# Patient Record
Sex: Female | Born: 1960
Health system: Southern US, Community
[De-identification: ages and names within clinical notes are randomized; demographics above are authoritative.]

## PROBLEM LIST (undated history)

## (undated) ENCOUNTER — Emergency Department: Discharge status: Absent without leave | Payer: Medicare Other

## (undated) DIAGNOSIS — Z972 Presence of dental prosthetic device (complete) (partial): Secondary | ICD-10-CM

## (undated) DIAGNOSIS — K589 Irritable bowel syndrome without diarrhea: Secondary | ICD-10-CM

## (undated) DIAGNOSIS — I639 Cerebral infarction, unspecified: Secondary | ICD-10-CM

## (undated) DIAGNOSIS — F329 Major depressive disorder, single episode, unspecified: Secondary | ICD-10-CM

## (undated) DIAGNOSIS — R0789 Other chest pain: Secondary | ICD-10-CM

## (undated) DIAGNOSIS — M1990 Unspecified osteoarthritis, unspecified site: Secondary | ICD-10-CM

## (undated) DIAGNOSIS — I5189 Other ill-defined heart diseases: Secondary | ICD-10-CM

## (undated) DIAGNOSIS — K219 Gastro-esophageal reflux disease without esophagitis: Secondary | ICD-10-CM

## (undated) DIAGNOSIS — I2510 Atherosclerotic heart disease of native coronary artery without angina pectoris: Secondary | ICD-10-CM

## (undated) DIAGNOSIS — G459 Transient cerebral ischemic attack, unspecified: Secondary | ICD-10-CM

## (undated) DIAGNOSIS — J449 Chronic obstructive pulmonary disease, unspecified: Secondary | ICD-10-CM

## (undated) DIAGNOSIS — M797 Fibromyalgia: Secondary | ICD-10-CM

## (undated) DIAGNOSIS — F32A Depression, unspecified: Secondary | ICD-10-CM

## (undated) DIAGNOSIS — R29898 Other symptoms and signs involving the musculoskeletal system: Secondary | ICD-10-CM

## (undated) DIAGNOSIS — R55 Syncope and collapse: Secondary | ICD-10-CM

## (undated) DIAGNOSIS — I959 Hypotension, unspecified: Secondary | ICD-10-CM

## (undated) HISTORY — DX: Depression, unspecified: F32.A

## (undated) HISTORY — PX: TONSILLECTOMY: SUR1361

## (undated) HISTORY — DX: Syncope and collapse: R55

## (undated) HISTORY — DX: Other chest pain: R07.89

## (undated) HISTORY — DX: Other ill-defined heart diseases: I51.89

## (undated) HISTORY — DX: Hypotension, unspecified: I95.9

## (undated) HISTORY — DX: Atherosclerotic heart disease of native coronary artery without angina pectoris: I25.10

## (undated) HISTORY — DX: Major depressive disorder, single episode, unspecified: F32.9

## (undated) HISTORY — PX: VAGINAL HYSTERECTOMY: SUR661

## (undated) HISTORY — DX: Other symptoms and signs involving the musculoskeletal system: R29.898

## (undated) HISTORY — PX: OOPHORECTOMY: SHX86

## (undated) HISTORY — DX: Transient cerebral ischemic attack, unspecified: G45.9

---

## 2003-08-13 LAB — HM COLONOSCOPY

## 2007-11-13 ENCOUNTER — Other Ambulatory Visit: Payer: Self-pay

## 2007-11-13 ENCOUNTER — Emergency Department: Payer: Self-pay | Admitting: Emergency Medicine

## 2010-07-26 ENCOUNTER — Observation Stay: Payer: Self-pay | Admitting: Internal Medicine

## 2011-02-08 ENCOUNTER — Ambulatory Visit: Payer: Self-pay | Admitting: Family Medicine

## 2011-04-22 ENCOUNTER — Emergency Department: Payer: Self-pay | Admitting: Emergency Medicine

## 2011-04-28 ENCOUNTER — Ambulatory Visit: Payer: Self-pay | Admitting: Nephrology

## 2011-05-05 ENCOUNTER — Ambulatory Visit: Payer: Self-pay | Admitting: Family Medicine

## 2011-05-10 ENCOUNTER — Ambulatory Visit: Payer: Self-pay

## 2011-05-22 ENCOUNTER — Ambulatory Visit: Payer: Self-pay | Admitting: Family Medicine

## 2011-06-20 ENCOUNTER — Ambulatory Visit: Payer: Self-pay

## 2012-01-08 ENCOUNTER — Ambulatory Visit: Payer: Self-pay | Admitting: Family Medicine

## 2012-01-16 ENCOUNTER — Ambulatory Visit: Payer: Self-pay | Admitting: Internal Medicine

## 2012-05-16 ENCOUNTER — Ambulatory Visit: Payer: Self-pay | Admitting: Family Medicine

## 2012-05-17 ENCOUNTER — Ambulatory Visit: Payer: Self-pay | Admitting: Family Medicine

## 2013-04-25 ENCOUNTER — Ambulatory Visit: Payer: Self-pay | Admitting: Neurology

## 2013-04-28 ENCOUNTER — Ambulatory Visit: Payer: Self-pay | Admitting: Neurology

## 2013-07-26 ENCOUNTER — Emergency Department: Payer: Self-pay | Admitting: Internal Medicine

## 2013-11-27 ENCOUNTER — Observation Stay: Payer: Self-pay | Admitting: Internal Medicine

## 2013-11-27 LAB — COMPREHENSIVE METABOLIC PANEL
ANION GAP: 1 — AB (ref 7–16)
Albumin: 3.9 g/dL (ref 3.4–5.0)
Alkaline Phosphatase: 73 U/L
BUN: 10 mg/dL (ref 7–18)
Bilirubin,Total: 0.4 mg/dL (ref 0.2–1.0)
CHLORIDE: 106 mmol/L (ref 98–107)
Calcium, Total: 8.8 mg/dL (ref 8.5–10.1)
Co2: 30 mmol/L (ref 21–32)
Creatinine: 0.78 mg/dL (ref 0.60–1.30)
EGFR (African American): >60
EGFR (Non-African Amer.): >60
GLUCOSE: 97 mg/dL (ref 65–99)
OSMOLALITY: 273 (ref 275–301)
POTASSIUM: 4.1 mmol/L (ref 3.5–5.1)
SGOT(AST): 29 U/L (ref 15–37)
SGPT (ALT): 23 U/L (ref 12–78)
Sodium: 137 mmol/L (ref 136–145)
Total Protein: 8 g/dL (ref 6.4–8.2)

## 2013-11-27 LAB — DRUG SCREEN, URINE

## 2013-11-27 LAB — URINALYSIS, COMPLETE
BLOOD: NEGATIVE
Bilirubin,UR: NEGATIVE
Glucose,UR: NEGATIVE mg/dL (ref 0–75)
Ketone: NEGATIVE
LEUKOCYTE ESTERASE: NEGATIVE
Nitrite: NEGATIVE
PH: 9 (ref 4.5–8.0)
Protein: NEGATIVE
RBC,UR: <1 /HPF (ref 0–5)
SPECIFIC GRAVITY: 1.006 (ref 1.003–1.030)
SQUAMOUS EPITHELIAL: NONE SEEN
WBC UR: <1 /HPF (ref 0–5)

## 2013-11-27 LAB — CBC WITH DIFFERENTIAL/PLATELET
BASOS PCT: 0.9 %
Basophil #: 0.1 10*3/uL (ref 0.0–0.1)
Eosinophil #: 0.1 10*3/uL (ref 0.0–0.7)
Eosinophil %: 1 %
HCT: 45.5 % (ref 35.0–47.0)
HGB: 15.3 g/dL (ref 12.0–16.0)
Lymphocyte #: 2.9 10*3/uL (ref 1.0–3.6)
Lymphocyte %: 38.9 %
MCH: 30.6 pg (ref 26.0–34.0)
MCHC: 33.5 g/dL (ref 32.0–36.0)
MCV: 91 fL (ref 80–100)
MONO ABS: 0.5 x10 3/mm (ref 0.2–0.9)
MONOS PCT: 6.9 %
NEUTROS PCT: 52.3 %
Neutrophil #: 3.9 10*3/uL (ref 1.4–6.5)
PLATELETS: 255 10*3/uL (ref 150–440)
RBC: 4.98 10*6/uL (ref 3.80–5.20)
RDW: 12.9 % (ref 11.5–14.5)
WBC: 7.4 10*3/uL (ref 3.6–11.0)

## 2013-11-27 LAB — APTT: Activated PTT: 26.2 secs (ref 23.6–35.9)

## 2013-11-27 LAB — PROTIME-INR
INR: 0.9
PROTHROMBIN TIME: 12.3 s (ref 11.5–14.7)

## 2013-11-27 LAB — TROPONIN I

## 2013-11-28 LAB — BASIC METABOLIC PANEL
ANION GAP: 1 — AB (ref 7–16)
BUN: 14 mg/dL (ref 7–18)
CALCIUM: 8.5 mg/dL (ref 8.5–10.1)
CO2: 32 mmol/L (ref 21–32)
Chloride: 107 mmol/L (ref 98–107)
Creatinine: 0.75 mg/dL (ref 0.60–1.30)
EGFR (Non-African Amer.): >60
Glucose: 89 mg/dL (ref 65–99)
Osmolality: 279 (ref 275–301)
Potassium: 4.1 mmol/L (ref 3.5–5.1)
Sodium: 140 mmol/L (ref 136–145)

## 2013-11-28 LAB — CBC WITH DIFFERENTIAL/PLATELET
BASOS ABS: 0 10*3/uL (ref 0.0–0.1)
Basophil %: 0.5 %
EOS PCT: 1.3 %
Eosinophil #: 0.1 10*3/uL (ref 0.0–0.7)
HCT: 41 % (ref 35.0–47.0)
HGB: 13.8 g/dL (ref 12.0–16.0)
LYMPHS ABS: 2.8 10*3/uL (ref 1.0–3.6)
Lymphocyte %: 48.7 %
MCH: 30.9 pg (ref 26.0–34.0)
MCHC: 33.8 g/dL (ref 32.0–36.0)
MCV: 92 fL (ref 80–100)
Monocyte #: 0.6 x10 3/mm (ref 0.2–0.9)
Monocyte %: 10.8 %
NEUTROS ABS: 2.2 10*3/uL (ref 1.4–6.5)
Neutrophil %: 38.7 %
Platelet: 236 10*3/uL (ref 150–440)
RBC: 4.48 10*6/uL (ref 3.80–5.20)
RDW: 12.7 % (ref 11.5–14.5)
WBC: 5.7 10*3/uL (ref 3.6–11.0)

## 2013-11-28 LAB — LIPID PANEL
CHOLESTEROL: 203 mg/dL — AB (ref 0–200)
HDL Cholesterol: 44 mg/dL (ref 40–60)
LDL CHOLESTEROL, CALC: 131 mg/dL — AB (ref 0–100)
TRIGLYCERIDES: 140 mg/dL (ref 0–200)
VLDL Cholesterol, Calc: 28 mg/dL (ref 5–40)

## 2013-11-28 LAB — TSH: THYROID STIMULATING HORM: 1.7 u[IU]/mL

## 2013-11-29 LAB — URINE CULTURE

## 2013-12-19 DIAGNOSIS — F419 Anxiety disorder, unspecified: Secondary | ICD-10-CM | POA: Insufficient documentation

## 2013-12-19 DIAGNOSIS — F339 Major depressive disorder, recurrent, unspecified: Secondary | ICD-10-CM | POA: Insufficient documentation

## 2013-12-19 DIAGNOSIS — F329 Major depressive disorder, single episode, unspecified: Secondary | ICD-10-CM

## 2014-03-26 ENCOUNTER — Ambulatory Visit: Payer: Self-pay | Admitting: Family Medicine

## 2014-03-26 LAB — HM MAMMOGRAPHY

## 2014-04-27 ENCOUNTER — Emergency Department: Payer: Self-pay | Admitting: Emergency Medicine

## 2014-04-27 LAB — URINALYSIS, COMPLETE
BILIRUBIN, UR: NEGATIVE
Blood: NEGATIVE
Glucose,UR: NEGATIVE mg/dL (ref 0–75)
Ketone: NEGATIVE
LEUKOCYTE ESTERASE: NEGATIVE
Nitrite: NEGATIVE
PH: 8 (ref 4.5–8.0)
Protein: NEGATIVE
RBC,UR: NONE SEEN /HPF (ref 0–5)
Specific Gravity: 1.005 (ref 1.003–1.030)
Squamous Epithelial: <1
WBC UR: NONE SEEN /HPF (ref 0–5)

## 2014-04-27 LAB — BASIC METABOLIC PANEL
Anion Gap: 11 (ref 7–16)
BUN: 7 mg/dL (ref 7–18)
CHLORIDE: 106 mmol/L (ref 98–107)
Calcium, Total: 8.9 mg/dL (ref 8.5–10.1)
Co2: 27 mmol/L (ref 21–32)
Creatinine: 0.81 mg/dL (ref 0.60–1.30)
EGFR (African American): >60
Glucose: 85 mg/dL (ref 65–99)
Osmolality: 284 (ref 275–301)
Potassium: 3.8 mmol/L (ref 3.5–5.1)
Sodium: 144 mmol/L (ref 136–145)

## 2014-04-27 LAB — CBC WITH DIFFERENTIAL/PLATELET
Basophil #: 0.1 10*3/uL (ref 0.0–0.1)
Basophil %: 1 %
EOS ABS: 0 10*3/uL (ref 0.0–0.7)
Eosinophil %: 0.6 %
HCT: 44.8 % (ref 35.0–47.0)
HGB: 14.9 g/dL (ref 12.0–16.0)
Lymphocyte #: 2.7 10*3/uL (ref 1.0–3.6)
Lymphocyte %: 48 %
MCH: 30.9 pg (ref 26.0–34.0)
MCHC: 33.3 g/dL (ref 32.0–36.0)
MCV: 93 fL (ref 80–100)
MONO ABS: 0.4 x10 3/mm (ref 0.2–0.9)
Monocyte %: 7.2 %
Neutrophil #: 2.4 10*3/uL (ref 1.4–6.5)
Neutrophil %: 43.2 %
PLATELETS: 237 10*3/uL (ref 150–440)
RBC: 4.82 10*6/uL (ref 3.80–5.20)
RDW: 12.9 % (ref 11.5–14.5)
WBC: 5.6 10*3/uL (ref 3.6–11.0)

## 2014-04-27 LAB — TROPONIN I

## 2014-12-12 NOTE — Discharge Summary (Signed)
PATIENT NAME:  Lindsey Bautista, Lindsey Bautista MR#:  [id number] DATE OF BIRTH:  07-11-1961  DATE OF ADMISSION:  11/27/2013 DATE OF DISCHARGE:  11/28/2013  DISCHARGE DIAGNOSES:  Suspected anxiety and unusual stress.  No cerebrovascular accident.   SECONDARY DIAGNOSES: 1.  Depression.  2.  Headache.  3.  B12 deficiency.   CONSULTATION:  Physical and occupational therapy along with speech therapy.   PROCEDURES AND RADIOLOGY:   1.  A 2-D echocardiogram on 10th of April showed LVEF of 55% to 60%.  Normal global LV systolic function.  Mild mitral and tricuspid valve regurgitation.  2.  Chest x-ray on 9th of April showed findings consistent with COPD.  3.  CT scan of the head without contrast in the ED showed no acute intracranial pathology.   4.  CT scan of the cervical spine without contrast on 9th of April showed no acute pathology.  5.  MRI of the brain with and without contrast on 9th of April showed no acute pathology.   6.  MRI of the cervical spine without contrast on 9th of April showed mild broad-based disk bulging at C5 to C6.  7.  Bilateral carotid Doppler on 9th of April showed no evidence of focal carotid stenosis.   MAJOR LABORATORY PANEL:   1.  UA on admission was negative.  2.  Urine culture was negative.   SHORT HOSPITAL COURSE:  The patient is a 54 year old female with the above-mentioned medical problems who was admitted for possible passing out spell.  Please see Dr. Glade Stanford dictated history and physical for further details.  The patient underwent complete neurological work-up including carotid Dopplers, MRI of the brain and CT scan of the head which were all essentially negative.  After a long discussion with family and the patient it became apparent that the patient is under extreme stress and a lot of anxiety which is likely  playing a role in her episode of passing out.  The patient was back to normal.  She did not have any deficit.  The patient was evaluated by physical and  occupational therapy and no skilled needs were identified.  On 10th of April the patient was back to her baseline and was discharged home in stable condition.   PHYSICAL EXAMINATION: VITAL SIGNS:  On the date of discharge, her vital signs are as follows:  Temperature 98.1, heart rate 72 per minute, respirations 16 per minute, blood pressure 110/68 Hg.  She was saturating 94% on room air.  Pertinent physical examination on the date of discharge:  CARDIOVASCULAR:  S1, S2 normal.  No murmurs, rubs, or gallop.  LUNGS:  Clear to auscultation bilaterally.  No wheezing, rales, rhonchi, or crepitation.  ABDOMEN:  Soft, benign.  NEUROLOGIC:  Nonfocal examination.  All other physical examination remained at baseline.   DISCHARGE MEDICATIONS: 1.  Klonopin 1 mg by mouth twice daily as needed.  2.  Fluoxetine 20 mg by mouth daily.  3.  Cyclobenzaprine 10 mg by mouth twice daily as needed.   DISCHARGE DIET:  Regular.   DISCHARGE ACTIVITY:  As tolerated.   DISCHARGE INSTRUCTIONS AND FOLLOW-UP:  The patient was instructed to follow up with her primary care physician, Dr. Golden Pop, in 1 to 2 weeks.  She will need follow-up with [facility] in 4 to 6 weeks and with Dr. Cephus Shelling from psychiatry in 2 or 3 weeks.   Total time discharging this patient was 45 minutes.     ____________________________ Lucina Mellow. Manuella Ghazi,  MD vss:ea D: 11/28/2013 23:44:05 ET T: 11/29/2013 06:37:29 ET JOB#: [id number]  cc: [place]vada Seaborn S. Manuella Ghazi, MD, <Dictator> [name], MD Steva Colder. Nicolasa Ducking, MD [facility] Lucina Mellow [facility] MD ELECTRONICALLY SIGNED 12/03/2013 12:43

## 2014-12-12 NOTE — H&P (Signed)
PATIENT NAME:  Lindsey Bautista, Lindsey Bautista MR#:  [id number] DATE OF BIRTH:  10-16-1960  DATE OF ADMISSION:  11/27/2013  PRIMARY CARE PHYSICIAN: Golden Pop, MD  CHIEF COMPLAINT: Passed out.   HISTORY OF PRESENT ILLNESS: This is a 54 year old female. Husband this morning went to work. She did not say much this morning with breakfast and was not talking that much. The patient actually called her daughter and stated please help me. When the daughter came over she found her on the floor near the bathroom. The patient cannot recall much of what went on. The patient also found to be talking less, was just looking at the EMS people. Here in the ER was able to talk some one word answers and able to understand, not able to elaborate very much and difficult to understand. Yesterday, as per the husband, very weak and laid on the bed most of the day and her blood pressure was low. A few weeks ago had a similar event where she got up to quick and ended up falling on the floor. She has been out of work for a period of time and actually saw a neurologist, Dr. Manuella Ghazi, in the past because her grip strength and memory has not been that good and had a MRI without contrast that was negative. It showed white matter changes. Her grip strength on the right side and her memory have worsened over time. Hospitalist services have been contacted for further evaluation. That MRI of the brain that was done in the past was done September 2014.   PAST MEDICAL HISTORY: Depression, headache, B12 deficiency, white matter changes on the brain and problems with grip and memory.   PAST SURGICAL HISTORY: Hysterectomy and tonsils.   ALLERGIES: IVP DYE AND PENICILLIN.   MEDICATIONS: Include cyclobenzaprine 10 mg twice a day as needed for muscle spasm, fluoxetine 20 mg daily, Klonopin 1 mg twice a day as needed for anxiety.   SOCIAL HISTORY: She smokes 1 pack per day. No alcohol. No drug use. Used to work as a Magazine features editor. Currently unable to work very  much.   FAMILY HISTORY: Father died of COPD. Mother was mentally ill.   REVIEW OF SYSTEMS: Difficult to obtain secondary to dysarthria and difficult to understand.   PHYSICAL EXAMINATION: VITAL SIGNS: Temperature 97, pulse 68, respirations 20, blood pressure 122/62, pulse ox 100% on room air.  GENERAL: No respiratory distress.  EYES: Conjunctivae and lids normal. Pupils equal, round, and reactive to light. Extraocular muscles intact. No nystagmus.  EARS, NOSE, MOUTH AND THROAT: Tympanic membranes: No erythema. Nasal mucosa: No erythema. Throat: No erythema. no exudate seen. Lips and gums: No lesions.  NECK: The patient was in a hard collar when I saw her.  HEART: S1, S2 normal. No gallops, rubs, or murmurs heard. Carotid upstroke 2+ bilaterally. No bruits. Dorsalis pedis pulses 2+ bilaterally. No edema of the lower extremity.  ABDOMEN: Soft, nontender. No organosplenomegaly. Normoactive bowel sounds. No masses felt.  LYMPHATIC: No lymph nodes in the neck.  MUSCULOSKELETAL: No clubbing, edema or cyanosis.  SKIN: No ulcers or lesions.  NEUROLOGIC: Right-sided power 3+ on grip strength, 3+ on right upper extremity, 4/5 on right lower extremity, 4/5 on left lower extremity, 4/5 left upper extremity. Babinski negative. Cranial nerves II through XII grossly intact. Deep tendon reflexes 1+ bilateral lower extremity.  PSYCHIATRIC: The patient is alert and oriented to person and place. Follows commands.   DIAGNOSTIC DATA: Troponin negative. Glucose 97, BUN 10, creatinine 0.78, sodium 137,  potassium 4.1, chloride 106, CO2 30, calcium 8.8. Liver function tests normal range. PT, INR normal range. White blood cell count 7.4, H and H 15.3 and 45.5, platelet count 255,000.   CT scan of the cervical spine showed no gross intracranial abnormality, normal cervical spine.   CT scan of the head showed no gross intracranial abnormality.   Chest x-ray consistent with COPD.  EKG: Normal sinus rhythm, 81 beats  per minute. No acute ST-T wave changes.   ASSESSMENT AND PLAN: 1.  Suspected cerebrovascular accident with speech abnormalities, dysarthria and right-sided weakness more than left side. Will rule out a brain tumor and suspected stroke. Aspirin was given rectally in the Emergency Room. We will have the nurse do a swallow evaluation and get a speech evaluation. Continue aspirin daily. We will obtain a MRI of the brain with and without contrast to rule out brain tumor or stroke. Will also get a MRI of the cervical spine since the patient also has weakness on both sides. Get an echocardiogram and carotid ultrasound. Consults with speech therapy, PT, OT, and neurology. Case discussed with Dr. Irish Elders who will see the patient after imaging.   2.  Anxiety and depression. Continue psychiatric medications.  3.  B12 deficiency. Check a B12 level and continue oral supplementation. Since the patient also has memory loss, we will check a TSH and a RPR to complete that work-up.  4.  Tobacco abuse. Nicotine patch applied. Smoking cessation counseling done, 3 minutes by me.   ____________________________ Tana Conch. Leslye Peer, MD rjw:sb D: 11/27/2013 14:53:25 ET T: 11/27/2013 16:02:26 ET JOB#: [id number]  cc: Tana Conch. Leslye Peer, MD, <Dictator> [name], MD Marisue Brooklyn MD ELECTRONICALLY SIGNED 11/29/2013 15:54

## 2014-12-17 ENCOUNTER — Encounter: Payer: Self-pay | Admitting: Neurology

## 2014-12-17 ENCOUNTER — Ambulatory Visit (INDEPENDENT_AMBULATORY_CARE_PROVIDER_SITE_OTHER): Payer: Medicaid Other | Admitting: Neurology

## 2014-12-17 VITALS — BP 133/71 | HR 52 | Temp 97.0°F | Ht 62.0 in | Wt 129.8 lb

## 2014-12-17 DIAGNOSIS — R200 Anesthesia of skin: Secondary | ICD-10-CM | POA: Diagnosis not present

## 2014-12-17 DIAGNOSIS — R29898 Other symptoms and signs involving the musculoskeletal system: Secondary | ICD-10-CM | POA: Diagnosis not present

## 2014-12-17 DIAGNOSIS — R4182 Altered mental status, unspecified: Secondary | ICD-10-CM | POA: Diagnosis not present

## 2014-12-17 DIAGNOSIS — G8190 Hemiplegia, unspecified affecting unspecified side: Secondary | ICD-10-CM

## 2014-12-17 DIAGNOSIS — R202 Paresthesia of skin: Secondary | ICD-10-CM | POA: Diagnosis not present

## 2014-12-17 NOTE — Patient Instructions (Signed)
Overall you are doing fairly well but I do want to suggest a few things today:   Remember to drink plenty of fluid, eat healthy meals and do not skip any meals. Try to eat protein with a every meal and eat a healthy snack such as fruit or nuts in between meals. Try to keep a regular sleep-wake schedule and try to exercise daily, particularly in the form of walking, 20-30 minutes a day, if you can.   As far as diagnostic testing: MRI of the brain, eeg, lab  I would like to see you back after I receive all the records, will call you, sooner if we need to. Please call us with any interim questions, concerns, problems, updates or refill requests.   Please also call us for any test results so we can go over those with you on the phone.  My clinical assistant and will answer any of your questions and relay your messages to me and also relay most of my messages to you.   Our phone number is [phone]. We also have an after hours call service for urgent matters and there is a physician on-call for urgent questions. For any emergencies you know to call 911 or go to the nearest emergency room
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
## 2014-12-18 LAB — COMPREHENSIVE METABOLIC PANEL
A/G RATIO: 2 (ref 1.1–2.5)
ALT: 11 IU/L (ref 0–32)
AST: 17 IU/L (ref 0–40)
Albumin: 4.5 g/dL (ref 3.5–5.5)
Alkaline Phosphatase: 61 IU/L (ref 39–117)
BUN/Creatinine Ratio: 12 (ref 9–23)
BUN: 9 mg/dL (ref 6–24)
Bilirubin Total: 0.5 mg/dL (ref 0.0–1.2)
CALCIUM: 9.6 mg/dL (ref 8.7–10.2)
CO2: 26 mmol/L (ref 18–29)
CREATININE: 0.73 mg/dL (ref 0.57–1.00)
Chloride: 99 mmol/L (ref 97–108)
GFR calc non Af Amer: 94 mL/min/{1.73_m2} (ref >59)
GFR, EST AFRICAN AMERICAN: 109 mL/min/{1.73_m2} (ref >59)
GLOBULIN, TOTAL: 2.3 g/dL (ref 1.5–4.5)
Glucose: 87 mg/dL (ref 65–99)
Potassium: 5.3 mmol/L — ABNORMAL HIGH (ref 3.5–5.2)
SODIUM: 139 mmol/L (ref 134–144)
TOTAL PROTEIN: 6.8 g/dL (ref 6.0–8.5)

## 2014-12-24 ENCOUNTER — Ambulatory Visit
Admission: RE | Admit: 2014-12-24 | Discharge: 2014-12-24 | Disposition: A | Discharge status: Home / Self Care | Payer: Medicaid Other | Source: Ambulatory Visit | Attending: Neurology | Admitting: Neurology

## 2014-12-24 ENCOUNTER — Encounter (INDEPENDENT_AMBULATORY_CARE_PROVIDER_SITE_OTHER): Payer: Medicaid Other | Admitting: Neurology

## 2014-12-24 ENCOUNTER — Ambulatory Visit (INDEPENDENT_AMBULATORY_CARE_PROVIDER_SITE_OTHER): Payer: Medicaid Other | Admitting: Neurology

## 2014-12-24 DIAGNOSIS — R4182 Altered mental status, unspecified: Secondary | ICD-10-CM

## 2014-12-24 DIAGNOSIS — G8190 Hemiplegia, unspecified affecting unspecified side: Secondary | ICD-10-CM | POA: Diagnosis not present

## 2014-12-24 DIAGNOSIS — R200 Anesthesia of skin: Secondary | ICD-10-CM

## 2014-12-24 MED ORDER — GADOBENATE DIMEGLUMINE 529 MG/ML IV SOLN
12.0000 mL | Freq: Once | INTRAVENOUS | Status: AC | PRN
  Administered 2014-12-24: 12 mL via INTRAVENOUS

## 2014-12-24 NOTE — Procedures (Signed)
    History:  Lindsey Bautista is a 54 year old patient with a history of episodes of passing out associated with confusion, lack of memory for events. The patient is being evaluated for possible seizures.  This is a routine EEG. No skull defects are noted. Medications include clonazepam, Vivelle dot, Prozac, and Prilosec.   EEG classification: Normal awake  Description of the recording: The background rhythms of this recording consists of a fairly well modulated medium amplitude alpha rhythm of 9 Hz that is reactive to eye opening and closure. As the record progresses, the patient appears to remain in the waking state throughout the recording. Photic stimulation was performed, resulting in a bilateral and symmetric photic driving response. Hyperventilation was not performed. At no time during the recording does there appear to be evidence of spike or spike wave discharges or evidence of focal slowing. EKG monitor shows no evidence of cardiac rhythm abnormalities with a heart rate of 66.  Impression: This is a normal EEG recording in the waking state. No evidence of ictal or interictal discharges are seen.

## 2014-12-25 ENCOUNTER — Telehealth: Payer: Self-pay

## 2014-12-25 NOTE — Telephone Encounter (Signed)
Pt was informed that EGG results were normal

## 2014-12-28 ENCOUNTER — Telehealth: Payer: Self-pay | Admitting: Neurology

## 2014-12-28 NOTE — Telephone Encounter (Signed)
Called patient back to let her know results were not ready yet and I would call her back once MRI results are ready. Pt verbalized understanding.

## 2014-12-28 NOTE — Telephone Encounter (Signed)
Patient called wanting to f/u on her MRI results. Please call and advise # [phone]

## 2014-12-29 NOTE — Telephone Encounter (Signed)
Spoke with patient about normal MRI results for her age. Pt was wondering if it showed her previous stroke and what the doctor wanted to do from here. I told her I would send a message to Dr. Jaynee Eagles to clarify.

## 2014-12-29 NOTE — Telephone Encounter (Signed)
Spoke to patient, MRI did not show a stroke. Spoke to patient at length. No evidence for stroke.

## 2014-12-29 NOTE — Telephone Encounter (Signed)
Patient is calling as she has some question about her MRI results.  Please call.

## 2014-12-29 NOTE — Telephone Encounter (Signed)
Talked with patient to let her know Dr. Jaynee Eagles will call her back to discuss MRI results and her other questions. Pt verbalized understanding.

## 2014-12-31 ENCOUNTER — Ambulatory Visit
Admission: RE | Admit: 2014-12-31 | Discharge: 2014-12-31 | Disposition: A | Discharge status: Home / Self Care | Payer: Medicaid Other | Source: Ambulatory Visit | Attending: Family Medicine | Admitting: Family Medicine

## 2014-12-31 ENCOUNTER — Other Ambulatory Visit: Payer: Self-pay | Admitting: Family Medicine

## 2014-12-31 DIAGNOSIS — M25511 Pain in right shoulder: Secondary | ICD-10-CM

## 2015-01-05 ENCOUNTER — Other Ambulatory Visit: Payer: Self-pay | Admitting: Family Medicine

## 2015-01-05 DIAGNOSIS — M25611 Stiffness of right shoulder, not elsewhere classified: Secondary | ICD-10-CM

## 2015-01-12 ENCOUNTER — Ambulatory Visit: Admission: RE | Admit: 2015-01-12 | Payer: Medicaid Other | Source: Ambulatory Visit

## 2015-01-29 ENCOUNTER — Telehealth: Payer: Self-pay

## 2015-01-29 ENCOUNTER — Other Ambulatory Visit: Payer: Self-pay | Admitting: Family Medicine

## 2015-01-29 NOTE — Telephone Encounter (Signed)
Refill request fluoxetine 20mg 

## 2015-02-01 NOTE — Telephone Encounter (Signed)
Your patient 

## 2015-02-01 NOTE — Telephone Encounter (Signed)
Refill request also for Klonopin 1mg  PO BID PRN [facility] [address]  In addition has not heard anything about her cardiology referral

## 2015-02-02 ENCOUNTER — Ambulatory Visit
Admission: RE | Admit: 2015-02-02 | Discharge: 2015-02-02 | Disposition: A | Discharge status: Home / Self Care | Payer: Medicaid Other | Source: Ambulatory Visit | Attending: Family Medicine | Admitting: Family Medicine

## 2015-02-02 DIAGNOSIS — M25511 Pain in right shoulder: Secondary | ICD-10-CM | POA: Insufficient documentation

## 2015-02-02 DIAGNOSIS — R55 Syncope and collapse: Secondary | ICD-10-CM | POA: Insufficient documentation

## 2015-02-02 DIAGNOSIS — M25611 Stiffness of right shoulder, not elsewhere classified: Secondary | ICD-10-CM

## 2015-02-02 MED ORDER — FLUOXETINE HCL 10 MG PO CAPS: 20.0000 mg | ORAL_CAPSULE | Freq: Every day | ORAL | Status: DC

## 2015-02-25 ENCOUNTER — Other Ambulatory Visit: Payer: Self-pay | Admitting: Family Medicine

## 2015-02-25 NOTE — Telephone Encounter (Signed)
Not due until 03/04/15. OK to wait for MAC. Forwarded to him.

## 2015-02-25 NOTE — Telephone Encounter (Signed)
Rx Refill Request Received from:[facility] [address] Medication: Klonopin Last Seen:12/21/14 acute 12/08/14- Depression/anxiety Next Due: June 2016 Last Prescription:June 14 60 no refills Next Appt: Not scheduled Order Placed please review, sign and send

## 2015-02-26 NOTE — Telephone Encounter (Signed)
Received a voicemail from patient that she needs a refill on her klonopin. I spoke with patient, informed her that she should not be out until July 14, she stated that she takes them every 6hrs as needed and has had some family problems. I let her know that Dr.Crissman would be back on Monday, but Dr.Johnson would not be refilling it because it is to soon per Dr.Crissmans's directions.

## 2015-03-03 ENCOUNTER — Other Ambulatory Visit: Payer: Self-pay | Admitting: Family Medicine

## 2015-03-03 ENCOUNTER — Telehealth: Payer: Self-pay

## 2015-03-03 MED ORDER — CLONAZEPAM 1 MG PO TABS: 1.0000 mg | ORAL_TABLET | Freq: Two times a day (BID) | ORAL | Status: DC | PRN

## 2015-03-03 NOTE — Telephone Encounter (Signed)
Klonzapam 1mg  - [place]

## 2015-04-06 ENCOUNTER — Other Ambulatory Visit: Payer: Self-pay | Admitting: Family Medicine

## 2015-04-21 ENCOUNTER — Encounter: Payer: Self-pay | Admitting: Cardiovascular Disease

## 2015-04-21 ENCOUNTER — Ambulatory Visit (INDEPENDENT_AMBULATORY_CARE_PROVIDER_SITE_OTHER): Payer: Medicaid Other | Admitting: Cardiovascular Disease

## 2015-04-21 ENCOUNTER — Encounter (INDEPENDENT_AMBULATORY_CARE_PROVIDER_SITE_OTHER): Payer: Self-pay

## 2015-04-21 VITALS — BP 118/74 | HR 58 | Ht 62.0 in | Wt 131.8 lb

## 2015-04-21 DIAGNOSIS — Z720 Tobacco use: Secondary | ICD-10-CM | POA: Diagnosis not present

## 2015-04-21 DIAGNOSIS — R0602 Shortness of breath: Secondary | ICD-10-CM | POA: Diagnosis not present

## 2015-04-21 DIAGNOSIS — R55 Syncope and collapse: Secondary | ICD-10-CM | POA: Diagnosis not present

## 2015-04-21 DIAGNOSIS — F17210 Nicotine dependence, cigarettes, uncomplicated: Secondary | ICD-10-CM | POA: Insufficient documentation

## 2015-04-21 DIAGNOSIS — R079 Chest pain, unspecified: Secondary | ICD-10-CM

## 2015-04-21 DIAGNOSIS — F17200 Nicotine dependence, unspecified, uncomplicated: Secondary | ICD-10-CM

## 2015-04-21 NOTE — Assessment & Plan Note (Signed)
Symptoms concerning for stroke in April 2015, unable to talk for 4 days, right-sided weakness. No further episodes since that time of that significance.  She denies having Holter or event monitor in the past. Event monitor has been ordered to rule out arrhythmia

## 2015-04-21 NOTE — Patient Instructions (Addendum)
You are doing well. No medication changes were made.  We will order a 30 day monitor for  passout spells, syncope  Stress test can be ordered for shortness of breath, unable to treadmill  [place]  Your caregiver has ordered a Stress Test with nuclear imaging. The purpose of this test is to evaluate the blood supply to your heart muscle. This procedure is referred to as a "Non-Invasive Stress Test." This is because other than having an IV started in your vein, nothing is inserted or "invades" your body. Cardiac stress tests are done to find areas of poor blood flow to the heart by determining the extent of coronary artery disease (CAD). Some patients exercise on a treadmill, which naturally increases the blood flow to your heart, while others who are  unable to walk on a treadmill due to physical limitations have a pharmacologic/chemical stress agent called Lexiscan . This medicine will mimic walking on a treadmill by temporarily increasing your coronary blood flow.   Please note: these test may take anywhere between 2-4 hours to complete  PLEASE REPORT TO [facility] AT THE FIRST DESK WILL DIRECT YOU WHERE TO GO  Date of Procedure:_____Tuesday, Sept 6_____  Arrival Time for Procedure:____7:45 am_______   PLEASE NOTIFY THE OFFICE AT LEAST 24 HOURS IN ADVANCE IF YOU ARE UNABLE TO KEEP YOUR APPOINTMENT.  [phone]  How to prepare for your Myoview test:  1. Do not eat or drink after midnight 2. No caffeine for 24 hours prior to test 3. No smoking 24 hours prior to test. 4. Your medication may be taken with water.  If your doctor stopped a medication because of this test, do not take that medication. 5. Ladies, please do not wear dresses.  Skirts or pants are appropriate. Please wear a short sleeve shirt. 6. No perfume, cologne or lotion.  Please call us if you have new issues that need to be addressed before your next appt.    Cardiac Event  Monitoring A cardiac event monitor is a small recording device used to help detect abnormal heart rhythms (arrhythmias). The monitor is used to record heart rhythm when noticeable symptoms such as the following occur:  Fast heartbeats (palpitations), such as heart racing or fluttering.  Dizziness.  Fainting or light-headedness.  Unexplained weakness. The monitor is wired to two electrodes placed on your chest. Electrodes are flat, sticky disks that attach to your skin. The monitor can be worn for up to 30 days. You will wear the monitor at all times, except when bathing.  HOW TO USE YOUR CARDIAC EVENT MONITOR A technician will prepare your chest for the electrode placement. The technician will show you how to place the electrodes, how to work the monitor, and how to replace the batteries. Take time to practice using the monitor before you leave the office. Make sure you understand how to send the information from the monitor to your health care provider. This requires a telephone with a landline, not a cell phone. You need to:  Wear your monitor at all times, except when you are in water:  Do not get the monitor wet.  Take the monitor off when bathing. Do not swim or use a hot tub with it on.  Keep your skin clean. Do not put body lotion or moisturizer on your chest.  Change the electrodes daily or any time they stop sticking to your skin. You might need to use tape to keep them on.  It is possible that your skin under the electrodes could become irritated. To keep this from happening, try to put the electrodes in slightly different places on your chest. However, they must remain in the area under your left breast and in the upper right section of your chest.  Make sure the monitor is safely clipped to your clothing or in a location close to your body that your health care provider recommends.  Press the button to record when you feel symptoms of heart trouble, such as dizziness,  weakness, light-headedness, palpitations, thumping, shortness of breath, unexplained weakness, or a fluttering or racing heart. The monitor is always on and records what happened slightly before you pressed the button, so do not worry about being too late to get good information.  Keep a diary of your activities, such as walking, doing chores, and taking medicine. It is especially important to note what you were doing when you pushed the button to record your symptoms. This will help your health care provider determine what might be contributing to your symptoms. The information stored in your monitor will be reviewed by your health care provider alongside your diary entries.  Send the recorded information as recommended by your health care provider. It is important to understand that it will take some time for your health care provider to process the results.  Change the batteries as recommended by your health care provider. SEEK IMMEDIATE MEDICAL CARE IF:  7. You have chest pain. 8. You have extreme difficulty breathing or shortness of breath. 9. You develop a very fast heartbeat that persists. 10. You develop dizziness that does not go away. 11. You faint or constantly feel you are about to faint. Document Released: 05/16/2008 Document Revised: 12/22/2013 Document Reviewed: 02/03/2013 [facility] Patient Information 2015 [place], [place]. This information is not intended to replace advice given to you by your health care provider. Make sure you discuss any questions you have with your health care provider. Cardiac Nuclear Scanning A cardiac nuclear scan is used to check your heart for problems, such as the following:  A portion of the heart is not getting enough blood.  Part of the heart muscle has died, which happens with a heart attack.  The heart wall is not working normally.  In this test, a radioactive dye (tracer) is injected into your bloodstream. After the tracer has traveled to your  heart, a scanning device is used to measure how much of the tracer is absorbed by or distributed to various areas of your heart. LET [facility] CARE PROVIDER KNOW ABOUT:  Any allergies you have.  All medicines you are taking, including vitamins, herbs, eye drops, creams, and over-the-counter medicines.  Previous problems you or members of your family have had with the use of anesthetics.  Any blood disorders you have.  Previous surgeries you have had.  Medical conditions you have.  RISKS AND COMPLICATIONS Generally, this is a safe procedure. However, as with any procedure, problems can occur. Possible problems include:  12. Serious chest pain. 13. Rapid heartbeat. 14. Sensation of warmth in your chest. This usually passes quickly. BEFORE THE PROCEDURE Ask your health care provider about changing or stopping your regular medicines. PROCEDURE This procedure is usually done at a hospital and takes 2-4 hours.  An IV tube is inserted into one of your veins.  Your health care provider will inject a small amount of radioactive tracer through the tube.  You will then wait for 20-40 minutes while the tracer  travels through your bloodstream.  You will lie down on an exam table so images of your heart can be taken. Images will be taken for about 15-20 minutes.  You will exercise on a treadmill or stationary bike. While you exercise, your heart activity will be monitored with an electrocardiogram (ECG), and your blood pressure will be checked.  If you are unable to exercise, you may be given a medicine to make your heart beat faster.  When blood flow to your heart has peaked, tracer will again be injected through the IV tube.  After 20-40 minutes, you will get back on the exam table and have more images taken of your heart.  When the procedure is over, your IV tube will be removed. AFTER THE PROCEDURE  You will likely be able to leave shortly after the test. Unless your health care  provider tells you otherwise, you may return to your normal schedule, including diet, activities, and medicines.  Make sure you find out how and when you will get your test results. Document Released: 09/01/2004 Document Revised: 08/12/2013 Document Reviewed: 07/16/2013 [employer] Patient Information 2015 [place], [place]. This information is not intended to replace advice given to you by your health care provider. Make sure you discuss any questions you have with your health care provider.

## 2015-04-21 NOTE — Assessment & Plan Note (Signed)
Continued occasional chest pain symptoms. Sometimes at rest, sometimes with exertion.  Stress test has been ordered. She reports that she is unable to treadmill given orthopedic issues, shortness of breath issues  Myoview ordered

## 2015-04-21 NOTE — Assessment & Plan Note (Signed)
We have encouraged her to continue to work on weaning her cigarettes and smoking cessation. She will continue to work on this and does not want any assistance with chantix.  

## 2015-04-21 NOTE — Assessment & Plan Note (Signed)
Occasional shortness of breath issues. Unclear if this is from long smoking history and COPD, exacerbated by heat.  Less often associated with exertion though will order stress test without ischemia

## 2015-04-21 NOTE — Progress Notes (Signed)
Patient ID: Lindsey Bautista, female    DOB: 05/19/1961, 54 y.o.   MRN: [id number]  HPI Comments: [name] is a 54 year old woman with history of anxiety/depression, long smoking history for close to 40 years, strong family history of coronary artery disease, history of stroke type symptoms in April 2015 where she could not talk for 4 days, right-sided weakness, was in the hospital was significant workup at that time, seen by neurology since then, continued waxing waning symptoms of weakness, sometimes with shortness of breath, excessive somnolence, who presents for prior episode of syncope.  Previous episode of chest pain, had a stress test in 2015 which was reportedly normal. Notes also indicating stress test in 2013 which was normal by report.  She's not reactive at baseline though able to do all her ADLs. She has numerous issues including random weakness, episodes of shortness of breath, excessive somnolence rare once every 2 weeks she will have to lay down and she can sleep all day. Occasional dizziness. No recent syncope or near syncope. She's not been driving a car in 16 months. She continues to smoke heavily  Orthostatics done in the office today show no significant drop in her blood pressure, infect blood pressure climbed with standing. Heart rate stayed in the mid to upper 50s  Currently not on any cardiac medications .  EKG on today's visit shows sinus bradycardia with rate 58 bpm, no significant ST or T-wave changes    Allergies  Allergen Reactions  . Penicillin G Anaphylaxis  . Iodinated Diagnostic Agents Rash and Nausea And Vomiting    Current Outpatient Prescriptions on File Prior to Visit  Medication Sig Dispense Refill  . clonazePAM (KLONOPIN) 1 MG tablet Take 1 tablet (1 mg total) by mouth 2 (two) times daily as needed. 60 tablet 2  . estradiol (ESTRACE) 1 MG tablet TAKE ONE TABLET BY MOUTH ONCE DAILY 30 tablet 3  . estradiol (VIVELLE-DOT) 0.1 MG/24HR patch Place  onto the skin.    [name] FLUoxetine (PROZAC) 10 MG capsule Take 2 capsules (20 mg total) by mouth daily. 60 capsule 6  . omeprazole (PRILOSEC) 20 MG capsule Take 20 mg by mouth daily.     No current facility-administered medications on file prior to visit.    Past Medical History  Diagnosis Date  . Depression   . Anxiety   . Hypotension   . Right arm weakness     Past Surgical History  Procedure Laterality Date  . Vaginal hysterectomy      Social History  reports that she has been smoking Cigarettes.  She has been smoking about 1.00 pack per day. She does not have any smokeless tobacco history on file. She reports that she does not drink alcohol or use illicit drugs.  Family History family history includes COPD in an other family member; Heart disease in an other family member; Stroke in an other family member.   Review of Systems  Constitutional: Negative.   Respiratory: Positive for shortness of breath.   Cardiovascular: Positive for chest pain.  Gastrointestinal: Negative.   Musculoskeletal: Negative.   Skin: Negative.   Neurological: Positive for dizziness and light-headedness.  Hematological: Negative.   Psychiatric/Behavioral: Negative.   All other systems reviewed and are negative.   BP 118/74 mmHg  Pulse 58  Ht 5\' 2"  (1.575 m)  Wt 131 lb 12 oz (59.761 kg)  BMI 24.09 kg/m2  Physical Exam  Constitutional: She is oriented to person, place, and time. She appears well-developed  and well-nourished.  HENT:  Head: Normocephalic.  Nose: Nose normal.  Mouth/Throat: Oropharynx is clear and moist.  Eyes: Conjunctivae are normal. Pupils are equal, round, and reactive to light.  Neck: Normal range of motion. Neck supple. No JVD present.  Cardiovascular: Normal rate, regular rhythm, normal heart sounds and intact distal pulses.  Exam reveals no gallop and no friction rub.   No murmur heard. Pulmonary/Chest: Effort normal and breath sounds normal. No respiratory distress.  She has no wheezes. She has no rales. She exhibits no tenderness.  Abdominal: Soft. Bowel sounds are normal. She exhibits no distension. There is no tenderness.  Musculoskeletal: Normal range of motion. She exhibits no edema or tenderness.  Lymphadenopathy:    She has no cervical adenopathy.  Neurological: She is alert and oriented to person, place, and time. Coordination normal.  Skin: Skin is warm and dry. No rash noted. No erythema.  Psychiatric: She has a normal mood and affect. Her behavior is normal. Judgment and thought content normal.

## 2015-04-21 NOTE — Assessment & Plan Note (Signed)
Orthostatics were normal. Currently not on any medications  Given prior history of syncope, 30 day monitor has been ordered to rule out arrhythmia

## 2015-04-27 ENCOUNTER — Encounter
Admission: RE | Admit: 2015-04-27 | Discharge: 2015-04-27 | Disposition: A | Discharge status: Home / Self Care | Payer: Medicaid Other | Source: Ambulatory Visit | Attending: Cardiovascular Disease | Admitting: Cardiovascular Disease

## 2015-04-27 DIAGNOSIS — R55 Syncope and collapse: Secondary | ICD-10-CM | POA: Diagnosis not present

## 2015-04-27 DIAGNOSIS — R0602 Shortness of breath: Secondary | ICD-10-CM | POA: Diagnosis not present

## 2015-04-27 HISTORY — DX: Chronic obstructive pulmonary disease, unspecified: J44.9

## 2015-04-27 HISTORY — DX: Cerebral infarction, unspecified: I63.9

## 2015-04-27 LAB — NM MYOCAR MULTI W/SPECT W/WALL MOTION / EF
CHL CUP NUCLEAR SDS: 0
CHL CUP NUCLEAR SRS: 2
CSEPHR: 59 %
LV dias vol: 55 mL
LV sys vol: 15 mL
Peak HR: 99 {beats}/min
Rest HR: 62 {beats}/min
SSS: 0

## 2015-04-27 MED ORDER — REGADENOSON 0.4 MG/5ML IV SOLN
0.4000 mg | Freq: Once | INTRAVENOUS | Status: AC
  Administered 2015-04-27: 0.4 mg via INTRAVENOUS
  Filled 2015-04-27: qty 5

## 2015-04-27 MED ORDER — TECHNETIUM TC 99M SESTAMIBI - CARDIOLITE
30.0000 | Freq: Once | INTRAVENOUS | Status: AC | PRN
  Administered 2015-04-27: 09:00:00 31.13 via INTRAVENOUS

## 2015-04-27 MED ORDER — TECHNETIUM TC 99M SESTAMIBI - CARDIOLITE
13.0000 | Freq: Once | INTRAVENOUS | Status: AC | PRN
  Administered 2015-04-27: 13.586 via INTRAVENOUS

## 2015-04-28 ENCOUNTER — Telehealth: Payer: Self-pay | Admitting: Family Medicine

## 2015-04-28 MED ORDER — FLUCONAZOLE 150 MG PO TABS: 150.0000 mg | ORAL_TABLET | Freq: Once | ORAL | Status: DC

## 2015-04-28 NOTE — Telephone Encounter (Signed)
Pt would like to have diflucan called in because she has been using monistat but its not working. Pt would like it to be sent to [facility] on [address].

## 2015-04-28 NOTE — Telephone Encounter (Signed)
Forward to provider

## 2015-04-29 ENCOUNTER — Telehealth: Payer: Self-pay

## 2015-04-29 NOTE — Telephone Encounter (Signed)
Pt would like stress test results. Please call. 

## 2015-04-29 NOTE — Telephone Encounter (Signed)
Please see result note 

## 2015-06-07 ENCOUNTER — Ambulatory Visit (INDEPENDENT_AMBULATORY_CARE_PROVIDER_SITE_OTHER): Payer: Medicaid Other | Admitting: Family Medicine

## 2015-06-07 ENCOUNTER — Encounter: Payer: Self-pay | Admitting: Family Medicine

## 2015-06-07 VITALS — BP 149/63 | HR 61 | Temp 97.7°F | Ht 61.5 in | Wt 134.0 lb

## 2015-06-07 DIAGNOSIS — F419 Anxiety disorder, unspecified: Secondary | ICD-10-CM

## 2015-06-07 DIAGNOSIS — R55 Syncope and collapse: Secondary | ICD-10-CM | POA: Diagnosis not present

## 2015-06-07 DIAGNOSIS — F329 Major depressive disorder, single episode, unspecified: Secondary | ICD-10-CM

## 2015-06-07 DIAGNOSIS — F32A Depression, unspecified: Secondary | ICD-10-CM

## 2015-06-07 MED ORDER — CLONAZEPAM 1 MG PO TABS: 1.0000 mg | ORAL_TABLET | Freq: Two times a day (BID) | ORAL | Status: DC | PRN

## 2015-06-07 NOTE — Progress Notes (Signed)
BP 149/63 mmHg  Pulse 61  Temp(Src) 97.7 F (36.5 C)  Ht 5' 1.5" (1.562 m)  Wt 134 lb (60.782 kg)  BMI 24.91 kg/m2  SpO2 99%   Subjective:    Patient ID: Lindsey Bautista, female    DOB: Sep 30, 1960, 54 y.o.   MRN: [id number]  HPI: Lindsey Bautista is a 54 y.o. female  Chief Complaint  Patient presents with  . Anxiety   patient is very complicated medical problems with falling and syncope spells has had full neurological and cardiac workup which are essentially negative. Patient under more stress and anybody I know.  Still complaining that clonazepam may not be strong enough and wanted something stronger. No thoughts of suicide. Patient had a breakdown on a little over a month ago with marked loss of control with extreme behavior went on for several days. She was rescued by her daughter taken to [place] and calm down over a period of week or so.  Relevant past medical, surgical, family and social history reviewed and updated as indicated. Interim medical history since our last visit reviewed. Allergies and medications reviewed and updated.  Review of Systems  Constitutional: Negative.   Respiratory: Negative.   Cardiovascular: Negative.     Per HPI unless specifically indicated above     Objective:    BP 149/63 mmHg  Pulse 61  Temp(Src) 97.7 F (36.5 C)  Ht 5' 1.5" (1.562 m)  Wt 134 lb (60.782 kg)  BMI 24.91 kg/m2  SpO2 99%  Wt Readings from Last 3 Encounters:  06/07/15 134 lb (60.782 kg)  12/31/14 130 lb (58.968 kg)  04/21/15 131 lb 12 oz (59.761 kg)    Physical Exam  Constitutional: She is oriented to person, place, and time. She appears well-developed and well-nourished. No distress.  HENT:  Head: Normocephalic and atraumatic.  Right Ear: Hearing normal.  Left Ear: Hearing normal.  Nose: Nose normal.  Eyes: Conjunctivae and lids are normal. Right eye exhibits no discharge. Left eye exhibits no discharge. No scleral icterus.  Cardiovascular: Normal  rate, regular rhythm and normal heart sounds.   Pulmonary/Chest: Effort normal and breath sounds normal. No respiratory distress.  Musculoskeletal: Normal range of motion.  Neurological: She is alert and oriented to person, place, and time.  Skin: Skin is intact. No rash noted.  Psychiatric: She has a normal mood and affect. Her speech is normal and behavior is normal. Judgment and thought content normal. Cognition and memory are normal.    Results for orders placed or performed in visit on 06/07/15  HM MAMMOGRAPHY  Result Value Ref Range   HM Mammogram from PP   HM COLONOSCOPY  Result Value Ref Range   HM Colonoscopy from PP       Assessment & Plan:   Problem List Items Addressed This Visit      Cardiovascular and Mediastinum   Syncope    Has not had any syncopal spells in the last month or so. It is only fallen one time.      Relevant Orders   Ambulatory referral to Psychiatry     Other   Acute anxiety - Primary    Stress is gotten much worse and very difficult social situation Instead of increasing clonazepam will decrease to half a tablet twice a day for a week and then back to a full tablet or she should notice a marked improvement. We'll make an appointment with psychiatry to further evaluate optimizing patient's psychiatric care  Relevant Orders   Ambulatory referral to Psychiatry       Follow up plan: Return in about 4 weeks (around 07/05/2015), or if symptoms worsen or fail to improve, for Recheck nerves and stress follow-up from psychiatry visit.

## 2015-06-07 NOTE — Assessment & Plan Note (Signed)
Stress is gotten much worse and very difficult social situation Instead of increasing clonazepam will decrease to half a tablet twice a day for a week and then back to a full tablet or she should notice a marked improvement. We'll make an appointment with psychiatry to further evaluate optimizing patient's psychiatric care

## 2015-06-07 NOTE — Assessment & Plan Note (Signed)
Has not had any syncopal spells in the last month or so. It is only fallen one time.

## 2015-06-08 ENCOUNTER — Telehealth: Payer: Self-pay | Admitting: Family Medicine

## 2015-06-08 NOTE — Telephone Encounter (Signed)
Call patient to tell her to sign up for my chart and she can print out the note from yesterday. That should serve as her disability note

## 2015-06-08 NOTE — Telephone Encounter (Signed)
Pt needs a letter for her disability. She said she forgot to ask yesterday. She'd appreciate it if it could be mailed to her.

## 2015-06-21 ENCOUNTER — Ambulatory Visit (INDEPENDENT_AMBULATORY_CARE_PROVIDER_SITE_OTHER): Payer: Medicaid Other | Admitting: Family Medicine

## 2015-06-21 ENCOUNTER — Encounter: Payer: Self-pay | Admitting: Family Medicine

## 2015-06-21 VITALS — BP 121/70 | HR 60 | Temp 97.9°F | Ht 61.0 in | Wt 132.0 lb

## 2015-06-21 DIAGNOSIS — Z1239 Encounter for other screening for malignant neoplasm of breast: Secondary | ICD-10-CM

## 2015-06-21 DIAGNOSIS — Z23 Encounter for immunization: Secondary | ICD-10-CM | POA: Diagnosis not present

## 2015-06-21 DIAGNOSIS — F329 Major depressive disorder, single episode, unspecified: Secondary | ICD-10-CM

## 2015-06-21 DIAGNOSIS — R55 Syncope and collapse: Secondary | ICD-10-CM

## 2015-06-21 DIAGNOSIS — F32A Depression, unspecified: Secondary | ICD-10-CM

## 2015-06-21 DIAGNOSIS — Z0000 Encounter for general adult medical examination without abnormal findings: Secondary | ICD-10-CM | POA: Diagnosis not present

## 2015-06-21 DIAGNOSIS — F419 Anxiety disorder, unspecified: Secondary | ICD-10-CM | POA: Diagnosis not present

## 2015-06-21 LAB — URINALYSIS, ROUTINE W REFLEX MICROSCOPIC
Bilirubin, UA: NEGATIVE
Glucose, UA: NEGATIVE
Ketones, UA: NEGATIVE
LEUKOCYTES UA: NEGATIVE
Nitrite, UA: NEGATIVE
Protein, UA: NEGATIVE
RBC UA: NEGATIVE
Specific Gravity, UA: 1.015 (ref 1.005–1.030)
UUROB: 0.2 mg/dL (ref 0.2–1.0)
pH, UA: 7 (ref 5.0–7.5)

## 2015-06-21 MED ORDER — ESTRADIOL 1 MG PO TABS: 1.0000 mg | ORAL_TABLET | Freq: Every day | ORAL | Status: DC

## 2015-06-21 MED ORDER — FLUOXETINE HCL 10 MG PO CAPS: 20.0000 mg | ORAL_CAPSULE | Freq: Every day | ORAL | Status: DC

## 2015-06-21 NOTE — Progress Notes (Signed)
BP 121/70 mmHg  Pulse 60  Temp(Src) 97.9 F (36.6 C)  Ht 5\' 1"  (1.549 m)  Wt 132 lb (59.875 kg)  BMI 24.95 kg/m2  SpO2 99%   Subjective:    Patient ID: Lindsey Bautista, female    DOB: [birth year], 54 y.o.   MRN: [id number]  HPI: Lindsey Bautista is a 54 y.o. female  Chief Complaint  Patient presents with  . Annual Exam   patient still ongoing neurological changes and had any falling spells or fainting for some time. That had an episode this morning started with feeling very weak and some headache for the last several days was able to lay down and not have any fainting spells patient is just tired as a consequence of this spell at this point.  Patient also with some abdominal bloating weight is been stable but some sensation of fullness and occasional discomfort. No constipation no blood in stool or urine.     Relevant past medical, surgical, family and social history reviewed and updated as indicated. Interim medical history since our last visit reviewed. Allergies and medications reviewed and updated.  Other than noted above Review of Systems  Constitutional: Negative.   HENT: Negative.   Eyes: Negative.   Respiratory: Negative.   Cardiovascular: Negative.   Gastrointestinal: Negative.   Endocrine: Negative.   Genitourinary: Negative.   Musculoskeletal: Negative.   Skin: Negative.   Allergic/Immunologic: Negative.   Neurological: Negative.   Hematological: Negative.   Psychiatric/Behavioral: Negative.     Per HPI unless specifically indicated above     Objective:    BP 121/70 mmHg  Pulse 60  Temp(Src) 97.9 F (36.6 C)  Ht 5\' 1"  (1.549 m)  Wt 132 lb (59.875 kg)  BMI 24.95 kg/m2  SpO2 99%  Wt Readings from Last 3 Encounters:  06/21/15 132 lb (59.875 kg)  06/07/15 134 lb (60.782 kg)  12/31/14 130 lb (58.968 kg)    Physical Exam  Constitutional: She is oriented to person, place, and time. She appears well-developed and well-nourished. No distress.   HENT:  Head: Normocephalic and atraumatic.  Right Ear: Hearing and external ear normal.  Left Ear: Hearing and external ear normal.  Nose: Nose normal.  Mouth/Throat: Oropharynx is clear and moist.  Eyes: Conjunctivae, EOM and lids are normal. Pupils are equal, round, and reactive to light. Right eye exhibits no discharge. Left eye exhibits no discharge. No scleral icterus.  Neck: Normal range of motion. Neck supple. Carotid bruit is not present.  Cardiovascular: Normal rate, regular rhythm and normal heart sounds.   No murmur heard. Pulmonary/Chest: Effort normal and breath sounds normal. No respiratory distress. She exhibits no mass. Right breast exhibits no mass, no skin change and no tenderness. Left breast exhibits no mass, no skin change and no tenderness. Breasts are symmetrical.  Abdominal: Soft. Bowel sounds are normal. There is no hepatosplenomegaly.  Musculoskeletal: Normal range of motion.  Neurological: She is alert and oriented to person, place, and time.  Skin: Skin is intact. No rash noted.  Psychiatric: She has a normal mood and affect. Her speech is normal and behavior is normal. Judgment and thought content normal. Cognition and memory are normal.    Results for orders placed or performed in visit on 06/07/15  HM MAMMOGRAPHY  Result Value Ref Range   HM Mammogram from PP   HM COLONOSCOPY  Result Value Ref Range   HM Colonoscopy from PP       Assessment & Plan:  Problem List Items Addressed This Visit      Cardiovascular and Mediastinum   Syncope     Other   Depression   Relevant Medications   FLUoxetine (PROZAC) 10 MG capsule   Acute anxiety   Relevant Medications   FLUoxetine (PROZAC) 10 MG capsule    Other Visit Diagnoses    Immunization due    -  Primary    Relevant Orders    Flu Vaccine QUAD 36+ mos PF IM (Fluarix & Fluzone Quad PF) (Completed)    Breast cancer screening        Relevant Orders    MM DIGITAL SCREENING BILATERAL    PE (physical  exam), annual        Relevant Orders    Comprehensive metabolic panel    Lipid panel    CBC with Differential/Platelet    Urinalysis, Routine w reflex microscopic (not at [facility])    TSH        Follow up plan: Return in about 3 months (around 09/21/2015), or if symptoms worsen or fail to improve, for anxiety.

## 2015-06-22 ENCOUNTER — Telehealth: Payer: Self-pay | Admitting: Family Medicine

## 2015-06-22 DIAGNOSIS — E785 Hyperlipidemia, unspecified: Secondary | ICD-10-CM

## 2015-06-22 LAB — COMPREHENSIVE METABOLIC PANEL
ALK PHOS: 72 IU/L (ref 39–117)
ALT: 8 IU/L (ref 0–32)
AST: 17 IU/L (ref 0–40)
Albumin/Globulin Ratio: 1.7 (ref 1.1–2.5)
Albumin: 4.3 g/dL (ref 3.5–5.5)
BUN/Creatinine Ratio: 12 (ref 9–23)
BUN: 8 mg/dL (ref 6–24)
Bilirubin Total: 0.4 mg/dL (ref 0.0–1.2)
CALCIUM: 9.5 mg/dL (ref 8.7–10.2)
CO2: 28 mmol/L (ref 18–29)
CREATININE: 0.66 mg/dL (ref 0.57–1.00)
Chloride: 98 mmol/L (ref 97–106)
GFR calc Af Amer: 116 mL/min/{1.73_m2} (ref >59)
GFR, EST NON AFRICAN AMERICAN: 100 mL/min/{1.73_m2} (ref >59)
GLUCOSE: 89 mg/dL (ref 65–99)
Globulin, Total: 2.6 g/dL (ref 1.5–4.5)
Potassium: 4.7 mmol/L (ref 3.5–5.2)
Sodium: 138 mmol/L (ref 136–144)
Total Protein: 6.9 g/dL (ref 6.0–8.5)

## 2015-06-22 LAB — LIPID PANEL
CHOL/HDL RATIO: 4.1 ratio (ref 0.0–4.4)
CHOLESTEROL TOTAL: 252 mg/dL — AB (ref 100–199)
HDL: 62 mg/dL (ref >39)
LDL CALC: 169 mg/dL — AB (ref 0–99)
TRIGLYCERIDES: 105 mg/dL (ref 0–149)

## 2015-06-22 LAB — CBC WITH DIFFERENTIAL/PLATELET
BASOS ABS: 0 10*3/uL (ref 0.0–0.2)
Basos: 1 %
EOS (ABSOLUTE): 0 10*3/uL (ref 0.0–0.4)
Eos: 1 %
HEMOGLOBIN: 14.3 g/dL (ref 11.1–15.9)
Hematocrit: 42 % (ref 34.0–46.6)
Immature Grans (Abs): 0 10*3/uL (ref 0.0–0.1)
Immature Granulocytes: 0 %
LYMPHS ABS: 2 10*3/uL (ref 0.7–3.1)
Lymphs: 31 %
MCH: 30.8 pg (ref 26.6–33.0)
MCHC: 34 g/dL (ref 31.5–35.7)
MCV: 90 fL (ref 79–97)
MONOS ABS: 0.5 10*3/uL (ref 0.1–0.9)
Monocytes: 7 %
NEUTROS ABS: 4 10*3/uL (ref 1.4–7.0)
Neutrophils: 60 %
PLATELETS: 301 10*3/uL (ref 150–379)
RBC: 4.65 x10E6/uL (ref 3.77–5.28)
RDW: 13.2 % (ref 12.3–15.4)
WBC: 6.5 10*3/uL (ref 3.4–10.8)

## 2015-06-22 LAB — TSH: TSH: 1.35 u[IU]/mL (ref 0.450–4.500)

## 2015-06-22 NOTE — Telephone Encounter (Signed)
-----   Message from Wynn Maudlin, [place] sent at 06/22/2015  4:31 PM EDT ----- labs

## 2015-06-22 NOTE — Telephone Encounter (Signed)
Phone call Discussed with patient markedly elevated cholesterol Discussed diet nutrition exercise Recheck lipid panel next office visit

## 2015-06-24 ENCOUNTER — Ambulatory Visit (INDEPENDENT_AMBULATORY_CARE_PROVIDER_SITE_OTHER): Payer: Medicaid Other | Admitting: Licensed Clinical Social Worker

## 2015-06-24 DIAGNOSIS — F411 Generalized anxiety disorder: Secondary | ICD-10-CM | POA: Diagnosis not present

## 2015-06-24 DIAGNOSIS — F331 Major depressive disorder, recurrent, moderate: Secondary | ICD-10-CM | POA: Diagnosis not present

## 2015-06-24 NOTE — Progress Notes (Signed)
Patient:   Lindsey Bautista   DOB:   May 12, 1961  MR Number:  [id number]  Location:  [facility] [address] [place] [address] Dept: [phone]           Date of Service:   06/24/2015  Start Time:   4p End Time:   5p  Provider/Observer:  Lubertha South Counselor       Billing Code/Service: [phone]  Behavioral Observation: Lindsey Bautista  presents as a 54 y.o.-year-old Caucasian Female who appeared her stated age. her dress was Appropriate and she was Casual and her manners were Appropriate to the situation.  There were not any physical disabilities noted.  she displayed an appropriate level of cooperation and motivation.    Interactions:    Active   Attention:   within normal limits  Memory:   within normal limits  Speech (Volume):  normal  Speech:   normal volume  Thought Process:  Coherent  Though Content:  WNL  Orientation:   person, place, time/date and situation  Judgment:   Good  Planning:   Good  Affect:    Depressed  Mood:    Depressed  Insight:   Good  Intelligence:   normal  Chief Complaint:     Chief Complaint  Patient presents with  . Depression  . Anxiety  . Establish Care    Reason for Service:  "I don't know my doctor sent me"  Current Symptoms:  Mad easily, rage, stroke about 1.5 years ago, unable to work, waiting on disability hearing, loss of independence, cant' drive, blackouts, mom is a issue (she has a mental issue), inability to get along with others, haven't seen mother in 4-6 years, husband cheated about 28 years ago, short term memory loss, reports that she was pregnant 28 years ago; her husband gave her an infection and she lost the baby, sleeps often, loss of appetite, not happy, sadness, agitated easily, easily tired, anxiety attacks, difficulty breathing, chest pain  Source of Distress:              "Not that  I know of"  Marital Status/Living: Married for the past 34 years/lives with her husband  Employment History: Disabled for the past 2.5 years/worked as a Psychologist, counselling for 36 years  Education:   Secretary/administrator; 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 Describes childhood as "always taken care of"   Children/Grand-children:    Megan 52, Melia 26/3 (ages 61-10)  Natural/Informal Support:                           God, children, husband   Substance Use:  There is a documented history of tobacco abuse confirmed by the patient. likes to smoke Pyramid Light 100s about a pack daily since age 77.   Medical History:   Past Medical History  Diagnosis Date  . Depression   . Anxiety   . Hypotension   . Right arm weakness   .  Stroke ([place])   . COPD (chronic obstructive pulmonary disease) ([place])   . Situational syncope           Medication List       This list is accurate as of: 06/24/15  4:10 PM.  Always use your most recent med list.               aspirin 81 MG tablet  Take 81 mg by mouth daily.     clonazePAM 1 MG tablet  Commonly known as:  KLONOPIN  Take 1 tablet (1 mg total) by mouth 2 (two) times daily as needed.     estradiol 1 MG tablet  Commonly known as:  ESTRACE  Take 1 tablet (1 mg total) by mouth daily.     FLUoxetine 10 MG capsule  Commonly known as:  PROZAC  Take 2 capsules (20 mg total) by mouth daily.     omeprazole 20 MG capsule  Commonly known as:  PRILOSEC  Take 20 mg by mouth daily.              Sexual History:   History  Sexual Activity  . Sexual Activity: Not on file     Abuse/Trauma History: Miscarriage 28 years ago   Psychiatric  History:  Denies    Strengths:   visiting Grandkids, watching Grandkids play sports, going to the beach   Recovery Goals:  "I don't know"  Hobbies/Interests:               visiting with family, being around immediate family   Challenges/Barriers: "I don't know"    Family Med/Psych History:  Family History  Problem Relation Age of Onset  . Heart disease    . COPD    . Stroke      Risk of Suicide/Violence: low   History of Suicide/Violence:  Denies  Psychosis:   Her father passed away about 13 years ago; she hears his voice  Diagnosis:    Moderate single current episode of major depressive disorder ([place])  Generalized anxiety disorder  Impression/DX:  Lindsey Bautista is currently diagnosed with Major Depression, Recurrent, Moderate & Generalized Anxiety Disorder due to her current symptoms of Mad easily, rage, stroke about 1.5 years ago, unable to work, waiting on disability hearing, loss of independence, cant' drive, blackouts, mom is a issue (she has a mental issue), inability to get along with others, haven't seen mother in 4-6 years, husband cheated about 28 years ago, short term memory loss, reports that she was pregnant 28 years ago; her husband gave her an infection and she lost the baby, sleeps often, loss of appetite, not happy, sadness, agitated easily, easily tired, anxiety attacks, difficulty breathing, chest pain.  Lindsey Bautista is currently married and lives with her husband.  She has been on disability for the past 2.5 years/worked as a Psychologist, counselling for 36 years.  Lindsey Bautista will be best supported by medication management and outpatient therapy to assist with coping skills and understanding her triggers.  Lindsey Bautista denies current SI or HI.  Lindsey Bautista denies any healthy relationships beside with her husband.    Recommendation/Plan: Writer recommends Outpatient Therapy at least twice monthly to include but not limited to individual, group and or family therapy.  Medication Management is also  recommended to assist with her mood.

## 2015-07-05 ENCOUNTER — Telehealth: Payer: Self-pay | Admitting: Family Medicine

## 2015-07-05 ENCOUNTER — Ambulatory Visit: Payer: Medicaid Other | Admitting: Psychiatry

## 2015-07-05 MED ORDER — AZITHROMYCIN 250 MG PO TABS: ORAL_TABLET | ORAL | Status: DC

## 2015-07-05 NOTE — Telephone Encounter (Signed)
Pt called stated she needs an antibiotic. Pt has sore throat, ear pain, congestion, and a low grade temp. Stated she needs a strong antibiotic. Pharm is [facility] on [address]. Pt informed she would likely need to schedule an appointment. Thanks.

## 2015-07-13 ENCOUNTER — Other Ambulatory Visit: Payer: Self-pay | Admitting: Family Medicine

## 2015-07-19 ENCOUNTER — Ambulatory Visit (INDEPENDENT_AMBULATORY_CARE_PROVIDER_SITE_OTHER): Payer: No Typology Code available for payment source | Admitting: Licensed Clinical Social Worker

## 2015-07-19 DIAGNOSIS — F331 Major depressive disorder, recurrent, moderate: Secondary | ICD-10-CM

## 2015-07-21 NOTE — Progress Notes (Signed)
   THERAPIST PROGRESS NOTE  Session Time: 30min  Participation Level: Active  Behavioral Response: NeatAlertDepressed  Type of Therapy: Individual Therapy  Treatment Goals addressed: Coping  Interventions: CBT, Motivational Interviewing, Solution Focused, Strength-based, Supportive and Reframing  Summary: Lindsey Bautista is a 54 y.o. female who presents with continued symptoms of her diagnosis.  She continues to lack motivation, energy and interest in activities.  She canceled her appointment with Dr. Jimmye Norman and stated that her "other doctor gives me my meds."  She reports that she is unable to drive and that her oldest daughter refuses to interact with her.  She states that she has been trying to work on a routine as we discussed prior but unable to due to her difficulty with sleeping.  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   Suicidal/Homicidal: Nowithout intent/plan  Therapist Response: Writer recommends Outpatient Therapy at least twice monthly to include but not limited to individual, group and or family therapy.  Medication Management is also recommended to assist with her mood.   Plan: Return again in 2 weeks.  Diagnosis: Axis I: Depression    Axis II: No diagnosis    Lubertha South 07/21/2015

## 2015-08-02 ENCOUNTER — Emergency Department
Admission: EM | Admit: 2015-08-02 | Discharge: 2015-08-03 | Disposition: A | Discharge status: Home / Self Care | Payer: Medicaid Other | Attending: Emergency Medicine | Admitting: Emergency Medicine

## 2015-08-02 ENCOUNTER — Encounter: Payer: Self-pay | Admitting: Family Medicine

## 2015-08-02 ENCOUNTER — Ambulatory Visit (INDEPENDENT_AMBULATORY_CARE_PROVIDER_SITE_OTHER): Payer: Medicaid Other | Admitting: Family Medicine

## 2015-08-02 VITALS — BP 107/68 | HR 60 | Temp 98.0°F | Ht 61.0 in | Wt 135.0 lb

## 2015-08-02 DIAGNOSIS — R110 Nausea: Secondary | ICD-10-CM | POA: Insufficient documentation

## 2015-08-02 DIAGNOSIS — R1031 Right lower quadrant pain: Secondary | ICD-10-CM | POA: Diagnosis not present

## 2015-08-02 DIAGNOSIS — F17210 Nicotine dependence, cigarettes, uncomplicated: Secondary | ICD-10-CM | POA: Insufficient documentation

## 2015-08-02 DIAGNOSIS — Z880 Allergy status to penicillin: Secondary | ICD-10-CM | POA: Diagnosis not present

## 2015-08-02 DIAGNOSIS — R1011 Right upper quadrant pain: Secondary | ICD-10-CM | POA: Insufficient documentation

## 2015-08-02 DIAGNOSIS — Z79899 Other long term (current) drug therapy: Secondary | ICD-10-CM | POA: Insufficient documentation

## 2015-08-02 DIAGNOSIS — R109 Unspecified abdominal pain: Secondary | ICD-10-CM | POA: Diagnosis present

## 2015-08-02 DIAGNOSIS — Z7982 Long term (current) use of aspirin: Secondary | ICD-10-CM | POA: Insufficient documentation

## 2015-08-02 DIAGNOSIS — I10 Essential (primary) hypertension: Secondary | ICD-10-CM | POA: Diagnosis not present

## 2015-08-02 LAB — CBC WITH DIFFERENTIAL/PLATELET
Hematocrit: 43.1 % (ref 34.0–46.6)
Hemoglobin: 15 g/dL (ref 11.1–15.9)
LYMPHS ABS: 2.7 10*3/uL (ref 0.7–3.1)
LYMPHS: 48 %
MCH: 32 pg (ref 26.6–33.0)
MCHC: 34.8 g/dL (ref 31.5–35.7)
MCV: 92 fL (ref 79–97)
MID (ABSOLUTE): 0.5 10*3/uL (ref 0.1–1.6)
MID: 10 %
Neutrophils Absolute: 2.4 10*3/uL (ref 1.4–7.0)
Neutrophils: 43 %
PLATELETS: 303 10*3/uL (ref 150–379)
RBC: 4.69 x10E6/uL (ref 3.77–5.28)
RDW: 13.2 % (ref 12.3–15.4)
WBC: 5.6 10*3/uL (ref 3.4–10.8)

## 2015-08-02 LAB — COMPREHENSIVE METABOLIC PANEL
ALT: 15 U/L (ref 14–54)
AST: 19 U/L (ref 15–41)
Albumin: 4.3 g/dL (ref 3.5–5.0)
Alkaline Phosphatase: 72 U/L (ref 38–126)
Anion gap: 6 (ref 5–15)
BILIRUBIN TOTAL: 0.4 mg/dL (ref 0.3–1.2)
BUN: 13 mg/dL (ref 6–20)
CHLORIDE: 108 mmol/L (ref 101–111)
CO2: 26 mmol/L (ref 22–32)
CREATININE: 0.73 mg/dL (ref 0.44–1.00)
Calcium: 9.6 mg/dL (ref 8.9–10.3)
Glucose, Bld: 88 mg/dL (ref 65–99)
POTASSIUM: 4.1 mmol/L (ref 3.5–5.1)
Sodium: 140 mmol/L (ref 135–145)
TOTAL PROTEIN: 7.6 g/dL (ref 6.5–8.1)

## 2015-08-02 LAB — LIPASE, BLOOD: LIPASE: 40 U/L (ref 11–51)

## 2015-08-02 LAB — CBC
HCT: 42.4 % (ref 35.0–47.0)
Hemoglobin: 14.6 g/dL (ref 12.0–16.0)
MCH: 30.7 pg (ref 26.0–34.0)
MCHC: 34.5 g/dL (ref 32.0–36.0)
MCV: 89.1 fL (ref 80.0–100.0)
PLATELETS: 296 10*3/uL (ref 150–440)
RBC: 4.76 MIL/uL (ref 3.80–5.20)
RDW: 12.9 % (ref 11.5–14.5)
WBC: 8 10*3/uL (ref 3.6–11.0)

## 2015-08-02 LAB — URINALYSIS, ROUTINE W REFLEX MICROSCOPIC
BILIRUBIN UA: NEGATIVE
GLUCOSE, UA: NEGATIVE
KETONES UA: NEGATIVE
Leukocytes, UA: NEGATIVE
Nitrite, UA: NEGATIVE
Protein, UA: NEGATIVE
RBC, UA: NEGATIVE
SPEC GRAV UA: 1.01 (ref 1.005–1.030)
UUROB: 0.2 mg/dL (ref 0.2–1.0)
pH, UA: 6.5 (ref 5.0–7.5)

## 2015-08-02 MED ORDER — CYCLOBENZAPRINE HCL 5 MG PO TABS: 5.0000 mg | ORAL_TABLET | Freq: Three times a day (TID) | ORAL | Status: DC | PRN

## 2015-08-02 MED ORDER — ONDANSETRON HCL 4 MG/2ML IJ SOLN
4.0000 mg | INTRAMUSCULAR | Status: AC
  Administered 2015-08-02: 4 mg via INTRAVENOUS

## 2015-08-02 MED ORDER — ONDANSETRON HCL 4 MG/2ML IJ SOLN
INTRAMUSCULAR | Status: AC
  Filled 2015-08-02: qty 2

## 2015-08-02 MED ORDER — MORPHINE SULFATE (PF) 4 MG/ML IV SOLN
INTRAVENOUS | Status: AC
  Filled 2015-08-02: qty 1

## 2015-08-02 MED ORDER — MORPHINE SULFATE (PF) 4 MG/ML IV SOLN
4.0000 mg | INTRAVENOUS | Status: AC
  Administered 2015-08-02: 4 mg via INTRAVENOUS

## 2015-08-02 NOTE — Progress Notes (Signed)
BP 107/68 mmHg  Pulse 60  Temp(Src) 98 F (36.7 C)  Ht 5\' 1"  (1.549 m)  Wt 135 lb (61.236 kg)  BMI 25.52 kg/m2  SpO2 99%   Subjective:    Patient ID: Lindsey Bautista, female    DOB: Mar 14, 1961, 54 y.o.   MRN: [id number]  HPI: Lindsey Bautista is a 54 y.o. female  Chief Complaint  Patient presents with  . Abdominal Pain    "feels like labor pains"   patient with 3 days of severe right lower quadrant abdominal pain hasn't been able to eat his been essentially up all night has had nausea but no vomiting no diarrhea no blood in stool or urine no frequency urgency dysuria Patient's had normal bowel movements with no constipation and no diarrhea Patient doesn't remember whether her appendix is been removed or not than daily doing a hysterectomy said he removed it according to the patient colonoscopy about 15 years ago said there was an appendix.  also had both ovaries removed   Relevant past medical, surgical, family and social history reviewed and updated as indicated. Interim medical history since our last visit reviewed. Allergies and medications reviewed and updated.  Review of Systems  Constitutional: Positive for appetite change and fatigue. Negative for fever, chills and diaphoresis.  Respiratory: Negative.   Cardiovascular: Negative.     Per HPI unless specifically indicated above     Objective:    BP 107/68 mmHg  Pulse 60  Temp(Src) 98 F (36.7 C)  Ht 5\' 1"  (1.549 m)  Wt 135 lb (61.236 kg)  BMI 25.52 kg/m2  SpO2 99%  Wt Readings from Last 3 Encounters:  08/02/15 135 lb (61.236 kg)  06/21/15 132 lb (59.875 kg)  06/07/15 134 lb (60.782 kg)    Physical Exam  Constitutional: She is oriented to person, place, and time. She appears well-developed and well-nourished. No distress.  HENT:  Head: Normocephalic and atraumatic.  Right Ear: Hearing normal.  Left Ear: Hearing normal.  Nose: Nose normal.  Eyes: Conjunctivae and lids are normal. Right eye exhibits  no discharge. Left eye exhibits no discharge. No scleral icterus.  Cardiovascular: Normal rate, regular rhythm and normal heart sounds.   Pulmonary/Chest: Effort normal and breath sounds normal. No respiratory distress.  Abdominal: Soft. Bowel sounds are normal. She exhibits distension. She exhibits no mass. There is tenderness. There is no rebound and no guarding.  Some right lower quadrant tenderness noted in distention of abdomen in general  Musculoskeletal: Normal range of motion.  Neurological: She is alert and oriented to person, place, and time.  Skin: Skin is intact. No rash noted.  Psychiatric: She has a normal mood and affect. Her speech is normal and behavior is normal. Judgment and thought content normal. Cognition and memory are normal.    Results for orders placed or performed in visit on 06/21/15  Comprehensive metabolic panel  Result Value Ref Range   Glucose 89 65 - 99 mg/dL   BUN 8 6 - 24 mg/dL   Creatinine, Ser 0.66 0.57 - 1.00 mg/dL   GFR calc non Af Amer 100 >59 mL/min/1.73   GFR calc Af Amer 116 >59 mL/min/1.73   BUN/Creatinine Ratio 12 9 - 23   Sodium 138 136 - 144 mmol/L   Potassium 4.7 3.5 - 5.2 mmol/L   Chloride 98 97 - 106 mmol/L   CO2 28 18 - 29 mmol/L   Calcium 9.5 8.7 - 10.2 mg/dL   Total Protein 6.9 6.0 -  8.5 g/dL   Albumin 4.3 3.5 - 5.5 g/dL   Globulin, Total 2.6 1.5 - 4.5 g/dL   Albumin/Globulin Ratio 1.7 1.1 - 2.5   Bilirubin Total 0.4 0.0 - 1.2 mg/dL   Alkaline Phosphatase 72 39 - 117 IU/L   AST 17 0 - 40 IU/L   ALT 8 0 - 32 IU/L  Lipid panel  Result Value Ref Range   Cholesterol, Total 252 (H) 100 - 199 mg/dL   Triglycerides 105 0 - 149 mg/dL   HDL 62 >39 mg/dL   LDL Calculated 169 (H) 0 - 99 mg/dL   Chol/HDL Ratio 4.1 0.0 - 4.4 ratio units  CBC with Differential/Platelet  Result Value Ref Range   WBC 6.5 3.4 - 10.8 x10E3/uL   RBC 4.65 3.77 - 5.28 x10E6/uL   Hemoglobin 14.3 11.1 - 15.9 g/dL   Hematocrit 42.0 34.0 - 46.6 %   MCV 90 79 -  97 fL   MCH 30.8 26.6 - 33.0 pg   MCHC 34.0 31.5 - 35.7 g/dL   RDW 13.2 12.3 - 15.4 %   Platelets 301 150 - 379 x10E3/uL   Neutrophils 60 %   Lymphs 31 %   Monocytes 7 %   Eos 1 %   Basos 1 %   Neutrophils Absolute 4.0 1.4 - 7.0 x10E3/uL   Lymphocytes Absolute 2.0 0.7 - 3.1 x10E3/uL   Monocytes Absolute 0.5 0.1 - 0.9 x10E3/uL   EOS (ABSOLUTE) 0.0 0.0 - 0.4 x10E3/uL   Basophils Absolute 0.0 0.0 - 0.2 x10E3/uL   Immature Granulocytes 0 %   Immature Grans (Abs) 0.0 0.0 - 0.1 x10E3/uL  Urinalysis, Routine w reflex microscopic (not at [facility])  Result Value Ref Range   Specific Gravity, UA 1.015 1.005 - 1.030   pH, UA 7.0 5.0 - 7.5   Color, UA Yellow Yellow   Appearance Ur Clear Clear   Leukocytes, UA Negative Negative   Protein, UA Negative Negative/Trace   Glucose, UA Negative Negative   Ketones, UA Negative Negative   RBC, UA Negative Negative   Bilirubin, UA Negative Negative   Urobilinogen, Ur 0.2 0.2 - 1.0 mg/dL   Nitrite, UA Negative Negative  TSH  Result Value Ref Range   TSH 1.350 0.450 - 4.500 uIU/mL      Assessment & Plan:   Problem List Items Addressed This Visit      Other   Abdominal pain, acute, right lower quadrant - Primary    Discussed with patient CBC and urinalysis are normal with normal exam and no other changes suggestive of appendicitis will observe for now. Patient will stick to very light diet such as Gatorade and crackers Coca-Cola ginger ale Jell-O Will try cyclobenzaprine see if that doesn't help relieve some spasming May try Pepto-Bismol with reminder will turn her stool dark       Relevant Orders   Basic metabolic panel   CBC With Differential/Platelet   Urinalysis, Routine w reflex microscopic (not at [facility])       Follow up plan: Return for As scheduled.

## 2015-08-02 NOTE — Assessment & Plan Note (Signed)
Discussed with patient CBC and urinalysis are normal with normal exam and no other changes suggestive of appendicitis will observe for now. Patient will stick to very light diet such as Gatorade and crackers Coca-Cola ginger ale Jell-O Will try cyclobenzaprine see if that doesn't help relieve some spasming May try Pepto-Bismol with reminder will turn her stool dark

## 2015-08-02 NOTE — ED Provider Notes (Signed)
[facility] Emergency Department Provider Note  ____________________________________________  Time seen: 11:15 PM  I have reviewed the triage vital signs and the nursing notes.   HISTORY  Chief Complaint Abdominal Pain      HPI Lindsey Bautista is a 54 y.o. female resents with acute onset of right upper quadrant abdominal pain is currently 10 out of 10 with onset 2 days ago. Patient also admits to abdominal distention and nausea. Patient states that she saw her PCP Dr. Janae Bridgeman today and she was informed that this was secondary to muscle spasms. Patient tearful on arrival to the emergency department.     Past Medical History  Diagnosis Date  . Depression   . Anxiety   . Hypotension   . Right arm weakness   . Stroke ([place])   . COPD (chronic obstructive pulmonary disease) ([place])   . Situational syncope     Patient Active Problem List   Diagnosis Date Noted  . Abdominal pain, acute, right lower quadrant 08/02/2015  . Acute anxiety 06/07/2015  . Depression 06/07/2015  . Situational syncope 06/07/2015  . Chest pain 04/21/2015  . SOB (shortness of breath) 04/21/2015  . Faintness 04/21/2015  . Smoker 04/21/2015  . Syncope 04/21/2015  . Altered mental status 12/17/2014  . Right arm weakness 12/17/2014  . Right leg paresthesias 12/17/2014  . Hemisensory loss 12/17/2014  . Hemiparesis ([place]) 12/17/2014    Past Surgical History  Procedure Laterality Date  . Vaginal hysterectomy    . Tonsillectomy      Current Outpatient Rx  Name  Route  Sig  Dispense  Refill  . aspirin 81 MG tablet   Oral   Take 81 mg by mouth daily.         . clonazePAM (KLONOPIN) 1 MG tablet   Oral   Take 1 mg by mouth 2 (two) times daily.         . cyclobenzaprine (FLEXERIL) 5 MG tablet   Oral   Take 5 mg by mouth 3 (three) times daily as needed for muscle spasms.         [name] estradiol (ESTRACE) 1 MG tablet   Oral   Take 1 mg by mouth daily.         [name]  FLUoxetine (PROZAC) 20 MG capsule   Oral   Take 20 mg by mouth daily.         [name] omeprazole (PRILOSEC) 20 MG capsule   Oral   Take 20 mg by mouth daily.           Allergies Penicillin g and Iodinated diagnostic agents  Family History  Problem Relation Age of Onset  . Heart disease    . COPD    . Stroke      Social History Social History  Substance Use Topics  . Smoking status: Current Every Day Smoker -- 1.00 packs/day    Types: Cigarettes  . Smokeless tobacco: Never Used  . Alcohol Use: No    Review of Systems  Constitutional: Negative for fever. Eyes: Negative for visual changes. ENT: Negative for sore throat. Cardiovascular: Negative for chest pain. Respiratory: Negative for shortness of breath. Gastrointestinal: Positive for abdominal pain and nausea Genitourinary: Negative for dysuria. Musculoskeletal: Negative for back pain. Skin: Negative for rash. Neurological: Negative for headaches, focal weakness or numbness.   10-point ROS otherwise negative.  ____________________________________________   PHYSICAL EXAM:  VITAL SIGNS: ED Triage Vitals  Enc Vitals Group     BP 08/02/15  2259 146/81 mmHg     Pulse Rate 08/02/15 2259 95     Resp 08/02/15 2259 24     Temp 08/02/15 2259 98.1 F (36.7 C)     Temp Source 08/02/15 2259 Oral     SpO2 08/02/15 2259 96 %     Weight --      Height --      Head Cir --      Peak Flow --      Pain Score 08/02/15 2259 10     Pain Loc --      Pain Edu? --      Excl. in [place]? --      Constitutional: Alert and oriented. Well appearing and in no distress. Eyes: Conjunctivae are normal. PERRL. Normal extraocular movements. ENT   Head: Normocephalic and atraumatic.   Nose: No congestion/rhinnorhea.   Mouth/Throat: Mucous membranes are moist.   Neck: No stridor. Hematological/Lymphatic/Immunilogical: No cervical lymphadenopathy. Cardiovascular: Normal rate, regular rhythm. Normal and symmetric distal  pulses are present in all extremities. No murmurs, rubs, or gallops. Respiratory: Normal respiratory effort without tachypnea nor retractions. Breath sounds are clear and equal bilaterally. No wheezes/rales/rhonchi. Gastrointestinal: Soft and nontender. No distention. There is no CVA tenderness. Genitourinary: deferred Musculoskeletal: Nontender with normal range of motion in all extremities. No joint effusions.  No lower extremity tenderness nor edema. Neurologic:  Normal speech and language. No gross focal neurologic deficits are appreciated. Speech is normal.  Skin:  Skin is warm, dry and intact. No rash noted. Psychiatric: Mood and affect are normal. Speech and behavior are normal. Patient exhibits appropriate insight and judgment.  ____________________________________________    LABS (pertinent positives/negatives)  Labs Reviewed  URINALYSIS COMPLETEWITH MICROSCOPIC ([place] ONLY) - Abnormal; Notable for the following:    Color, Urine YELLOW (*)    APPearance CLEAR (*)    Bacteria, UA RARE (*)    Squamous Epithelial / LPF 0-5 (*)    All other components within normal limits  LIPASE, BLOOD  COMPREHENSIVE METABOLIC PANEL  CBC       RADIOLOGY    CT Abdomen Pelvis Wo Contrast (Final result) Result time: 08/03/15 03:10:04   Final result by Rad Results In Interface (08/03/15 03:10:04)   Narrative:   CLINICAL DATA: Acute onset of severe diffuse abdominal pain and distention. Initial encounter.  EXAM: CT ABDOMEN AND PELVIS WITHOUT CONTRAST  TECHNIQUE: Multidetector CT imaging of the abdomen and pelvis was performed following the standard protocol without IV contrast.  COMPARISON: CT of the chest and abdomen performed 05/22/2011  FINDINGS: The visualized lung bases are clear. Contrast is noted within the distal esophagus, suggesting either esophageal dysmotility or gastroesophageal reflux.  The liver and spleen are unremarkable in appearance. The gallbladder is  within normal limits. The pancreas is unremarkable. A 1.7 cm adrenal adenoma is noted at the right adrenal gland. The left adrenal gland is unremarkable in appearance.  The kidneys are unremarkable in appearance. There is no evidence of hydronephrosis. No renal or ureteral stones are seen. No perinephric stranding is appreciated.  No free fluid is identified. The small bowel is unremarkable in appearance. The stomach is within normal limits. No acute vascular abnormalities are seen. Scattered calcification is noted along the abdominal aorta and its branches.  The appendix is not definitely seen; there is no evidence for appendicitis. The colon is unremarkable in appearance.  The bladder is mildly distended and grossly unremarkable. The patient is status post hysterectomy. No suspicious adnexal masses are seen. No  inguinal lymphadenopathy is seen.  No acute osseous abnormalities are identified.  IMPRESSION: 1. No acute abnormality seen to explain the patient's symptoms. 2. Contrast within the distal esophagus suggests either esophageal dysmotility or gastroesophageal reflux. 3. Small right adrenal adenoma noted. 4. Scattered calcification along the abdominal aorta and its branches.   Electronically Signed By: Garald Balding M.D. On: 08/03/2015 03:10          US Abdomen Limited RUQ (Final result) Result time: 08/03/15 00:53:08   Procedure changed from US Abdomen Limited      Final result by Rad Results In Interface (08/03/15 00:53:08)   Narrative:   CLINICAL DATA: Right upper quadrant pain and nausea for 3 days.  EXAM: US ABDOMEN LIMITED - RIGHT UPPER QUADRANT  COMPARISON: CT 05/22/2011  FINDINGS: Gallbladder:  No gallstones or wall thickening visualized. No sonographic Murphy sign noted.  Common bile duct:  Diameter: 2 mm, normal  Liver:  No focal lesion identified. Within normal limits in parenchymal echogenicity.  IMPRESSION: No evidence  of cholelithiasis or cholecystitis.   Electronically Signed By: Lucienne Capers M.D. On: 08/03/2015 00:53    INITIAL IMPRESSION / ASSESSMENT AND PLAN / ED COURSE  Pertinent labs & imaging results that were available during my care of the patient were reviewed by me and considered in my medical decision making (see chart for details).  No clear etiology for the patient's abdominal pain noted ultrasound of the abdomen revealed no gallbladder disease CT scan of the abdomen and pelvis was revealed no acute intra-abdominal process  ____________________________________________   FINAL CLINICAL IMPRESSION(S) / ED DIAGNOSES  Final diagnoses:  Abdominal pain, acute, right lower quadrant      Gregor Hams, MD 08/03/15 [phone]

## 2015-08-02 NOTE — ED Notes (Signed)
Patient presents with c/o severe diffuse abdominal pain that has been worsening x 2 days. (+) distention noted. Patient went to PCP today and was told that she was having "muscle spasms". Patient tearful and in obvious discomfort in triage.

## 2015-08-03 ENCOUNTER — Emergency Department: Payer: Medicaid Other

## 2015-08-03 LAB — URINALYSIS COMPLETE WITH MICROSCOPIC (ARMC ONLY)
BILIRUBIN URINE: NEGATIVE
GLUCOSE, UA: NEGATIVE mg/dL
HGB URINE DIPSTICK: NEGATIVE
Ketones, ur: NEGATIVE mg/dL
LEUKOCYTES UA: NEGATIVE
NITRITE: NEGATIVE
Protein, ur: NEGATIVE mg/dL
SPECIFIC GRAVITY, URINE: 1.018 (ref 1.005–1.030)
pH: 8 (ref 5.0–8.0)

## 2015-08-03 LAB — BASIC METABOLIC PANEL
BUN/Creatinine Ratio: 11 (ref 9–23)
BUN: 9 mg/dL (ref 6–24)
CALCIUM: 9.7 mg/dL (ref 8.7–10.2)
CHLORIDE: 98 mmol/L (ref 96–106)
CO2: 26 mmol/L (ref 18–29)
Creatinine, Ser: 0.8 mg/dL (ref 0.57–1.00)
GFR calc non Af Amer: 84 mL/min/{1.73_m2} (ref >59)
GFR, EST AFRICAN AMERICAN: 97 mL/min/{1.73_m2} (ref >59)
Glucose: 79 mg/dL (ref 65–99)
Potassium: 4.9 mmol/L (ref 3.5–5.2)
Sodium: 138 mmol/L (ref 134–144)

## 2015-08-03 MED ORDER — MORPHINE SULFATE (PF) 4 MG/ML IV SOLN
INTRAVENOUS | Status: AC
  Filled 2015-08-03: qty 1

## 2015-08-03 MED ORDER — MORPHINE SULFATE (PF) 4 MG/ML IV SOLN
4.0000 mg | INTRAVENOUS | Status: AC
  Administered 2015-08-03: 4 mg via INTRAVENOUS

## 2015-08-03 NOTE — Discharge Instructions (Signed)

## 2015-08-04 ENCOUNTER — Other Ambulatory Visit: Payer: Self-pay

## 2015-08-04 ENCOUNTER — Telehealth: Payer: Self-pay | Admitting: Family Medicine

## 2015-08-04 DIAGNOSIS — R1031 Right lower quadrant pain: Secondary | ICD-10-CM

## 2015-08-04 NOTE — Telephone Encounter (Signed)
Patient with ongoing abdominal complaints and pain negative workup here in the emergency room with blood work, CT and ultrasound will refer to GI to further evaluate

## 2015-08-04 NOTE — Telephone Encounter (Signed)
Pt would like to get ref for coloscopy to Dr. Allen Norris.

## 2015-08-06 ENCOUNTER — Encounter: Payer: Self-pay | Admitting: Gastroenterology

## 2015-09-07 ENCOUNTER — Other Ambulatory Visit: Payer: Self-pay | Admitting: Family Medicine

## 2015-09-07 DIAGNOSIS — F1320 Sedative, hypnotic or anxiolytic dependence, uncomplicated: Secondary | ICD-10-CM

## 2015-09-07 NOTE — Telephone Encounter (Signed)
Patient should be getting this from her psychiatrist now; chart reviewed and she was referred Please have her contact her psychiatrist for refill Have psychiatrist call me or communicate personally through EP[facility] if they are not able to fill this

## 2015-09-08 ENCOUNTER — Telehealth: Payer: Self-pay | Admitting: Family Medicine

## 2015-09-08 DIAGNOSIS — Z79899 Other long term (current) drug therapy: Secondary | ICD-10-CM | POA: Insufficient documentation

## 2015-09-08 DIAGNOSIS — F1320 Sedative, hypnotic or anxiolytic dependence, uncomplicated: Secondary | ICD-10-CM | POA: Insufficient documentation

## 2015-09-08 MED ORDER — CLONAZEPAM 1 MG PO TABS
1.0000 mg | ORAL_TABLET | Freq: Two times a day (BID) | ORAL | Status: DC | PRN
Start: 2015-09-08 — End: 2015-09-17

## 2015-09-08 NOTE — Telephone Encounter (Signed)
Patient states she has only the therapist, not the doctor. She says she is completely out and Dr.Crissman has always refilled it with no problem.

## 2015-09-08 NOTE — Telephone Encounter (Signed)
Patient notified, she will have to call and and find a ride and she will come in for her UDS.

## 2015-09-08 NOTE — Telephone Encounter (Signed)
Rx was filled on 08/10/2015 per [place] I cannot find a controlled substance contract on her chart or a UDS (since Sept 2015) His note very clearly states that patient was being referred to psychiatrist (June 07, 2015) Please ask patient to come in for UDS and to sign controlled substance contract for refill please Once done, I will refill just enough to cover until Dr. Jeananne Rama returns, but she'll want to talk with him about the psychiatry referral from October

## 2015-09-08 NOTE — Telephone Encounter (Addendum)
I'll send in partial Rx so she doesn't withdraw, but we still request she come in for UDS and controlled substance agreement

## 2015-09-08 NOTE — Addendum Note (Signed)
Addended by: Geri Hepler, Satira Anis on: 09/08/2015 05:20 PM   Modules accepted: Orders, Medications

## 2015-09-08 NOTE — Telephone Encounter (Signed)
Thank you Miss Amy.

## 2015-09-16 ENCOUNTER — Telehealth: Payer: Self-pay | Admitting: Family Medicine

## 2015-09-16 NOTE — Telephone Encounter (Signed)
Pt needs 28 klonopin called into [facility] [address] to get her through to her appt on 09/30/15.  Dr Sanda Klein canceled her prescription.

## 2015-09-16 NOTE — Telephone Encounter (Signed)
Patient did not come in for UDS as requested Her primary is back today Lindsey Bautista --> CANCEL my Rx for clonazepam at [facility] please; they open at 9 am

## 2015-09-16 NOTE — Telephone Encounter (Signed)
Pharmacy notified.

## 2015-09-17 ENCOUNTER — Telehealth: Payer: Self-pay

## 2015-09-17 MED ORDER — CLONAZEPAM 1 MG PO TABS: 1.0000 mg | ORAL_TABLET | Freq: Two times a day (BID) | ORAL | Status: DC | PRN

## 2015-09-17 NOTE — Telephone Encounter (Signed)
OK to call in

## 2015-09-17 NOTE — Telephone Encounter (Signed)
Rx called in [facility], patient notified  Need to get control sub. Agreement at next appointment

## 2015-09-17 NOTE — Telephone Encounter (Signed)
Please fill her Klonopin, per MAC verbal

## 2015-09-30 ENCOUNTER — Ambulatory Visit (INDEPENDENT_AMBULATORY_CARE_PROVIDER_SITE_OTHER): Payer: Medicaid Other | Admitting: Family Medicine

## 2015-09-30 ENCOUNTER — Encounter: Payer: Self-pay | Admitting: Family Medicine

## 2015-09-30 VITALS — BP 106/67 | HR 86 | Temp 97.9°F | Ht 60.5 in | Wt 138.0 lb

## 2015-09-30 DIAGNOSIS — R55 Syncope and collapse: Secondary | ICD-10-CM | POA: Diagnosis not present

## 2015-09-30 DIAGNOSIS — M6249 Contracture of muscle, multiple sites: Secondary | ICD-10-CM | POA: Diagnosis not present

## 2015-09-30 DIAGNOSIS — M62838 Other muscle spasm: Secondary | ICD-10-CM | POA: Insufficient documentation

## 2015-09-30 MED ORDER — CLONAZEPAM 1 MG PO TABS: 1.0000 mg | ORAL_TABLET | Freq: Two times a day (BID) | ORAL | Status: DC | PRN

## 2015-09-30 NOTE — Progress Notes (Signed)
BP 106/67 mmHg  Pulse 86  Temp(Src) 97.9 F (36.6 C)  Ht 5' 0.5" (1.537 m)  Wt 138 lb (62.596 kg)  BMI 26.50 kg/m2  SpO2 97%   Subjective:    Patient ID: Lindsey Bautista, female    DOB: 07-Jul-1961, 55 y.o.   MRN: [id number]  HPI: Lindsey Bautista is a 55 y.o. female  Chief Complaint  Patient presents with  . Anxiety  . neck stiffness    started Sunday   patient with some neck muscle spasm and stiffness been ongoing for a couple of days Flexeril hasn't helped no fever or chills cough cold or other URI or head infection symptoms Patient's syncope has been a little less hasn't fallen or hurt yourself in some time. Still has right arm weakness difficulty along with right leg problems Symptoms seem to come and go sometimes better sometimes worse Last bad spell was in mid January has gotten a little better since then patient fell during this time but did not get hurt.   Relevant past medical, surgical, family and social history reviewed and updated as indicated. Interim medical history since our last visit reviewed. Allergies and medications reviewed and updated.  Review of Systems  Constitutional: Positive for fatigue. Negative for fever, chills and diaphoresis.  Respiratory: Negative for apnea, cough, choking and chest tightness.   Cardiovascular: Negative for chest pain, palpitations and leg swelling.    Per HPI unless specifically indicated above     Objective:    BP 106/67 mmHg  Pulse 86  Temp(Src) 97.9 F (36.6 C)  Ht 5' 0.5" (1.537 m)  Wt 138 lb (62.596 kg)  BMI 26.50 kg/m2  SpO2 97%  Wt Readings from Last 3 Encounters:  09/30/15 138 lb (62.596 kg)  08/02/15 135 lb (61.236 kg)  06/21/15 132 lb (59.875 kg)    Physical Exam  Constitutional: She is oriented to person, place, and time. She appears well-developed and well-nourished. No distress.  HENT:  Head: Normocephalic and atraumatic.  Right Ear: Hearing normal.  Left Ear: Hearing normal.  Nose: Nose  normal.  Eyes: Conjunctivae and lids are normal. Right eye exhibits no discharge. Left eye exhibits no discharge. No scleral icterus.  Cardiovascular: Normal rate, regular rhythm and normal heart sounds.   Pulmonary/Chest: Effort normal and breath sounds normal. No respiratory distress.  Musculoskeletal: Normal range of motion.  Left neck muscle spasm with marked tenderness with range of motion  Neurological: She is alert and oriented to person, place, and time.  Skin: Skin is intact. No rash noted.  Psychiatric: She has a normal mood and affect. Her speech is normal and behavior is normal. Judgment and thought content normal. Cognition and memory are normal.    Results for orders placed or performed during the hospital encounter of 08/02/15  Lipase, blood  Result Value Ref Range   Lipase 40 11 - 51 U/L  Comprehensive metabolic panel  Result Value Ref Range   Sodium 140 135 - 145 mmol/L   Potassium 4.1 3.5 - 5.1 mmol/L   Chloride 108 101 - 111 mmol/L   CO2 26 22 - 32 mmol/L   Glucose, Bld 88 65 - 99 mg/dL   BUN 13 6 - 20 mg/dL   Creatinine, Ser 0.73 0.44 - 1.00 mg/dL   Calcium 9.6 8.9 - 10.3 mg/dL   Total Protein 7.6 6.5 - 8.1 g/dL   Albumin 4.3 3.5 - 5.0 g/dL   AST 19 15 - 41 U/L   ALT  15 14 - 54 U/L   Alkaline Phosphatase 72 38 - 126 U/L   Total Bilirubin 0.4 0.3 - 1.2 mg/dL   GFR calc non Af Amer >60 >60 mL/min   GFR calc Af Amer >60 >60 mL/min   Anion gap 6 5 - 15  CBC  Result Value Ref Range   WBC 8.0 3.6 - 11.0 K/uL   RBC 4.76 3.80 - 5.20 MIL/uL   Hemoglobin 14.6 12.0 - 16.0 g/dL   HCT 42.4 35.0 - 47.0 %   MCV 89.1 80.0 - 100.0 fL   MCH 30.7 26.0 - 34.0 pg   MCHC 34.5 32.0 - 36.0 g/dL   RDW 12.9 11.5 - 14.5 %   Platelets 296 150 - 440 K/uL  Urinalysis complete, with microscopic ([facility] only)  Result Value Ref Range   Color, Urine YELLOW (A) YELLOW   APPearance CLEAR (A) CLEAR   Glucose, UA NEGATIVE NEGATIVE mg/dL   Bilirubin Urine NEGATIVE NEGATIVE   Ketones, ur  NEGATIVE NEGATIVE mg/dL   Specific Gravity, Urine 1.018 1.005 - 1.030   Hgb urine dipstick NEGATIVE NEGATIVE   pH 8.0 5.0 - 8.0   Protein, ur NEGATIVE NEGATIVE mg/dL   Nitrite NEGATIVE NEGATIVE   Leukocytes, UA NEGATIVE NEGATIVE   RBC / HPF 0-5 0 - 5 RBC/hpf   WBC, UA 0-5 0 - 5 WBC/hpf   Bacteria, UA RARE (A) NONE SEEN   Squamous Epithelial / LPF 0-5 (A) NONE SEEN   Mucous PRESENT       Assessment & Plan:   Problem List Items Addressed This Visit      Cardiovascular and Mediastinum   Syncope    Stable for now patient will continue to be careful with not being in situations where she could get hurt with fainting.        Other   Cervical paraspinal muscle spasm - Primary    Discuss home PT with stretching range of motion exercises          Follow up plan: Return in about 3 months (around 12/28/2015) for Follow-up syncope.

## 2015-09-30 NOTE — Assessment & Plan Note (Signed)
Discuss home PT with stretching range of motion exercises

## 2015-09-30 NOTE — Assessment & Plan Note (Signed)
Stable for now patient will continue to be careful with not being in situations where she could get hurt with fainting.

## 2015-10-07 ENCOUNTER — Other Ambulatory Visit: Payer: Self-pay

## 2015-10-07 ENCOUNTER — Telehealth: Payer: Self-pay | Admitting: Family Medicine

## 2015-10-07 DIAGNOSIS — R1031 Right lower quadrant pain: Secondary | ICD-10-CM

## 2015-10-07 NOTE — Telephone Encounter (Signed)
Pt called stated she needs a referral for Dr. Gaylyn Cheers @ [facility] for her colonoscopy. Pt stated she can not go to [facility] for this. Would like to go to [facility]. Thanks.

## 2015-10-07 NOTE — Telephone Encounter (Signed)
Pt called stated she needs a referral for Dr. Gaylyn Cheers @ [facility] for her colonoscopy. Pt stated she can not go to [facility] for this. Would like to go to [facility]. Thanks.

## 2015-10-07 NOTE — Telephone Encounter (Signed)
done

## 2015-10-12 ENCOUNTER — Ambulatory Visit: Payer: Medicaid Other | Admitting: Gastroenterology

## 2015-10-22 ENCOUNTER — Ambulatory Visit (INDEPENDENT_AMBULATORY_CARE_PROVIDER_SITE_OTHER): Payer: Medicaid Other | Admitting: Cardiovascular Disease

## 2015-10-22 ENCOUNTER — Encounter: Payer: Self-pay | Admitting: Cardiovascular Disease

## 2015-10-22 VITALS — BP 120/70 | HR 66 | Ht 61.0 in | Wt 139.0 lb

## 2015-10-22 DIAGNOSIS — R55 Syncope and collapse: Secondary | ICD-10-CM

## 2015-10-22 DIAGNOSIS — I739 Peripheral vascular disease, unspecified: Secondary | ICD-10-CM | POA: Diagnosis not present

## 2015-10-22 DIAGNOSIS — Z01818 Encounter for other preprocedural examination: Secondary | ICD-10-CM | POA: Diagnosis not present

## 2015-10-22 DIAGNOSIS — Z720 Tobacco use: Secondary | ICD-10-CM | POA: Diagnosis not present

## 2015-10-22 DIAGNOSIS — F17200 Nicotine dependence, unspecified, uncomplicated: Secondary | ICD-10-CM

## 2015-10-22 DIAGNOSIS — E785 Hyperlipidemia, unspecified: Secondary | ICD-10-CM

## 2015-10-22 MED ORDER — ROSUVASTATIN CALCIUM 10 MG PO TABS: 10.0000 mg | ORAL_TABLET | Freq: Every day | ORAL | Status: DC

## 2015-10-22 NOTE — Assessment & Plan Note (Signed)
Atypical presentations, no injury either from a standing position Symptoms occur even in a sitting position.  We have offered a 30 day monitor, she has declined at this time

## 2015-10-22 NOTE — Assessment & Plan Note (Signed)
Significant aortic atherosclerosis seen on CT scan extending into the common iliac arteries Likely secondary to long history of smoking and hyperlipidemia Strongly recommended smoking cessation Goal LDL less than 70, we have started a statin

## 2015-10-22 NOTE — Assessment & Plan Note (Addendum)
Recommended that she start Crestor 10 mg daily Previous lab work showing total cholesterol 250 Goal LDL is less than 70 Recommended recheck of her lipids in 6 months time   Total encounter time more than 25 minutes  Greater than 50% was spent in counseling and coordination of care with the patient

## 2015-10-22 NOTE — Progress Notes (Signed)
Patient ID: Lindsey Bautista, female    DOB: 28-Dec-1960, 55 y.o.   MRN: [id number]  HPI Comments: Lindsey Bautista is a 55 year old woman with history of anxiety/depression, long smoking history for close to 40 years, strong family history of coronary artery disease, history of stroke type symptoms in April 2015 where she could not talk for 4 days, right-sided weakness, was in the hospital was significant workup at that time, seen by neurology since then, continued waxing waning symptoms of weakness, sometimes with shortness of breath, excessive somnolence, who presents for prior episode of syncope.  In follow-up today, she reports that she continues to smoke Also reports that she has "passed out spells" sometimes when she is sitting, sometimes when she is standing.  Previous history of excess somnolence, random periods of weakness She is never injured herself when she "passes out" even despite having events when she is standing up.  CT scan of the abdomen from December 2016 reviewed with her Images pulled up in the office, showing moderate diffuse PAD extending into the common iliac arteries  EKG on today's visit shows normal sinus rhythm with rate 66 bpm, no significant ST or T-wave changes  Other past medical history Previous episode of chest pain, had a stress test in 2015 which was reportedly normal.   stress test in 2013 which was normal by report.  Previous Orthostatics done in the office did not show any significant drop in blood pressure Currently not on any cardiac medications .   Allergies  Allergen Reactions  . Penicillin G Anaphylaxis and Other (See Comments)    Has patient had a PCN reaction causing immediate rash, facial/tongue/throat swelling, SOB or lightheadedness with hypotension: [id number] Has patient had a PCN reaction causing severe rash involving mucus membranes or skin necrosis: [id number] Has patient had a PCN reaction that required hospitalization  [id number]} Has patient had a PCN reaction occurring within the last 10 years: [id number] If all of the above answers are "NO", then may proceed with Cephalosporin use.   . Iodinated Diagnostic Agents Rash and Nausea And Vomiting    Current Outpatient Prescriptions on File Prior to Visit  Medication Sig Dispense Refill  . aspirin 81 MG tablet Take 81 mg by mouth daily.    . clonazePAM (KLONOPIN) 1 MG tablet Take 1 tablet (1 mg total) by mouth 2 (two) times daily as needed for anxiety. No alcohol with this 60 tablet 2  . estradiol (ESTRACE) 1 MG tablet Take 1 mg by mouth daily.    [name] FLUoxetine (PROZAC) 20 MG capsule Take 20 mg by mouth daily.    [name] omeprazole (PRILOSEC) 20 MG capsule Take 20 mg by mouth daily.     No current facility-administered medications on file prior to visit.    Past Medical History  Diagnosis Date  . Depression   . Anxiety   . Hypotension   . Right arm weakness   . Stroke ([place])   . COPD (chronic obstructive pulmonary disease) ([place])   . Situational syncope     Past Surgical History  Procedure Laterality Date  . Vaginal hysterectomy    . Tonsillectomy      Social History  reports that she has been smoking Cigarettes.  She has a 25 pack-year smoking history. She has never used smokeless tobacco. She reports that she does not drink alcohol or use illicit drugs.  Family History family history is not on file.   Review of Systems  Constitutional: Negative.   Respiratory: Negative.  Cardiovascular: Negative.   Gastrointestinal: Negative.   Musculoskeletal: Negative.   Skin: Negative.   Neurological: Positive for syncope.  Hematological: Negative.   Psychiatric/Behavioral: Negative.   All other systems reviewed and are negative.   BP 120/70 mmHg  Pulse 66  Ht 5\' 1"  (1.549 m)  Wt 139 lb (63.05 kg)  BMI 26.28 kg/m2  Physical Exam  Constitutional: She is oriented to person, place, and time. She appears well-developed and well-nourished.   HENT:  Head: Normocephalic.  Nose: Nose normal.  Mouth/Throat: Oropharynx is clear and moist.  Eyes: Conjunctivae are normal. Pupils are equal, round, and reactive to light.  Neck: Normal range of motion. Neck supple. No JVD present.  Cardiovascular: Normal rate, regular rhythm, normal heart sounds and intact distal pulses.  Exam reveals no gallop and no friction rub.   No murmur heard. Pulmonary/Chest: Effort normal and breath sounds normal. No respiratory distress. She has no wheezes. She has no rales. She exhibits no tenderness.  Abdominal: Soft. Bowel sounds are normal. She exhibits no distension. There is no tenderness.  Musculoskeletal: Normal range of motion. She exhibits no edema or tenderness.  Lymphadenopathy:    She has no cervical adenopathy.  Neurological: She is alert and oriented to person, place, and time. Coordination normal.  Skin: Skin is warm and dry. No rash noted. No erythema.  Psychiatric: She has a normal mood and affect. Her behavior is normal. Judgment and thought content normal.

## 2015-10-22 NOTE — Assessment & Plan Note (Signed)
We have encouraged her to continue to work on weaning her cigarettes and smoking cessation. She will continue to work on this and does not want any assistance with chantix.  

## 2015-10-22 NOTE — Patient Instructions (Addendum)
You are doing well.  Please call if you have more pass out spells, We could order a monitor   You have aorta atherosclerosis on your CT scan   Please start crestor one a day for cholesterol  Call the office if you would like chantix  Please call us if you have new issues that need to be addressed before your next appt.  Your physician wants you to follow-up in: 6 months.  You will receive a reminder letter in the mail two months in advance. If you don't receive a letter, please call our office to schedule the follow-up appointment.  Atherosclerosis Atherosclerosis, or hardening of the arteries, is the buildup of plaque within the major arteries in the body. Plaque is made up of fats (lipids), cholesterol, calcium, and fibrous tissue. Plaque can narrow or block blood flow within an artery. Plaque can break off and cause damage to the affected organ. Plaque can also "rupture." When plaque ruptures within an artery, a clot can form, causing a sudden (acute) blockage of the artery. Untreated atherosclerosis can cause serious health problems or death.  RISK FACTORS  High cholesterol levels.  Smoking.  Obesity.  Lack of activity or exercise.  Eating a diet high in saturated fat.  Family history.  Diabetes. SIGNS AND SYMPTOMS  Symptoms of atherosclerosis can occur when blood flow to an artery is slowed or blocked. Severity and onset of symptoms depends on how extensive the narrowing or blockage is. A sudden plaque rupture can bring immediate, life-threatening symptoms. Atherosclerosis can affect different arteries in the body, for example:  Coronary arteries. The coronary arteries supply the heart with blood. When the coronary arteries are narrowed or blocked from atherosclerosis, this is known as coronary artery disease (CAD). CAD can cause a heart attack. Common heart attack symptoms include:  Chest pain or pain that radiates to the neck, arm, jaw, or in the upper, middle back  (mid-scapular pain).  Shortness of breath without cause.  Profuse sweating while at rest.  Irregular heartbeats.  Nausea or gastrointestinal upset.  Carotid arteries. The carotid arteries supply the brain with blood. They are located on each side of your neck. When blood flow to these arteries is slowed or blocked, a transient ischemic attack (TIA) or stroke can occur. A TIA is considered a "mini-stroke" or "warning stroke." TIA symptoms are the same as stroke symptoms, but they are temporary and last less than 24 hours. A stroke can cause permanent damage or death. Common TIA and stroke symptoms include:  Sudden numbness or weakness to one side of your body, such as the face, arm, or leg.  Sudden confusion or trouble speaking or understanding.  Sudden trouble seeing out of one or both eyes.  Sudden trouble walking, loss of balance, or dizziness.  Sudden, severe headache with no known cause.  Arteries in the legs. When arteries in the lower legs become narrowed or blocked, this is known as peripheral vascular disease (PVD). PVD can cause a symptom called claudication. Claudication is pain or a burning feeling in your legs when walking or exercising and usually goes away with rest. Very severe PVD can cause pain in your legs while at rest.  Renal arteries. The renal arteries supply the kidneys with blood. Blockage of the renal arteries can cause a decline in kidney function or high blood pressure (hypertension).  Gastrointestinal arteries (mesenteric circulation). Abdominal pain may occur after eating. DIAGNOSIS  Your health care provider may perform the following tests to diagnose atherosclerosis:  Blood tests.  Stress test.  Echocardiogram.  Nuclear scan.  Ankle/brachial index.  Ultrasonography.  Computed tomography (CT) scan.  Angiography. TREATMENT  Atherosclerosis treatment includes the following:  Lifestyle changes such as:  Quitting smoking. Your health care  provider can help you with smoking cessation.  Eating a diet low in saturated fat. A registered dietitian can educate you on healthy food options, such as helping you understand the difference between good fat and bad fat.  Following an exercise program approved by your health care provider.  Maintaining a healthy weight. Lose weight as approved by your health care provider.  Have your cholesterol levels checked as directed by your health care provider.  Medicines. Cholesterol medicines can help slow or stop the progression of atherosclerosis.  Different procedural or surgical interventions to treat atherosclerosis include:  Balloon angioplasty. The technical name for balloon angioplasty is percutaneous transluminal angioplasty (PTA). In this procedure, a catheter with a small balloon at the tip is inserted through the blocked or narrowed artery. The balloon is then inflated. When the balloon is inflated, the fatty plaque is compressed against the artery wall, allowing better blood flow within the artery.  Balloon angioplasty and stenting. In this procedure, balloon angioplasty is combined with a stenting procedure. A stent is a small, metal mesh tube that keeps the artery open. After the artery is opened up by the balloon technique, the stent is then deployed. The stent is permanent.  Open heart surgery or bypass surgery. To perform this type of surgery, a healthy vessel is first "harvested" from either the leg or arm. The harvested vessel is then used to "bypass" the blocked atherosclerotic vessel so new blood flow can be established.  Atherectomy. Atherectomy is a procedure that uses a catheter with a sharp blade to remove plaque from an artery. A chamber in the catheter collects the plaque.  Endarterectomy. An endarterectomy is a surgical procedure where a surgeon removes plaque from an artery.  Amputation. When blockages in the lower legs are very severe and circulation cannot be  restored, amputation may be required. SEEK IMMEDIATE MEDICAL CARE IF:  You are having heart attack symptoms, such as:  Chest pain or pain that radiates to the neck, arm, jaw, or in the upper, middle back (mid-scapular pain).  Shortness of breath without cause.  Profuse sweating while at rest.  Irregular heartbeats.  Nausea or gastrointestinal upset.  You are having stroke symptoms, such as sudden:  Numbness or weakness to one side of your body, such as the face, arm, or leg.  Confusion or trouble speaking or understanding.  Trouble seeing out of one or both eyes.  Trouble walking, loss of balance, or dizziness.  Severe headache with no known cause.  Your hands or feet are bluish, cold, or you have pain in them.  You have bad abdominal pain after eating. Symptoms of heart attack or stroke may represent a serious problem that is an emergency. Do not wait to see if the symptoms will go away. Get medical help right away. Call your local emergency services (911 in the [place] Do not drive yourself to the hospital.   This information is not intended to replace advice given to you by your health care provider. Make sure you discuss any questions you have with your health care provider.   Document Released: 10/28/2003 Document Revised: 08/28/2014 Document Reviewed: 10/10/2011 Elsevier Interactive Patient Education 2016 [employer]. Steps to Quit Smoking  Smoking tobacco can be harmful to your health and  can affect almost every organ in your body. Smoking puts you, and those around you, at risk for developing many serious chronic diseases. Quitting smoking is difficult, but it is one of the best things that you can do for your health. It is never too late to quit. WHAT ARE THE BENEFITS OF QUITTING SMOKING? When you quit smoking, you lower your risk of developing serious diseases and conditions, such as:  Lung cancer or lung disease, such as COPD.  Heart disease.  Stroke.  Heart  attack.  Infertility.  Osteoporosis and bone fractures. Additionally, symptoms such as coughing, wheezing, and shortness of breath may get better when you quit. You may also find that you get sick less often because your body is stronger at fighting off colds and infections. If you are pregnant, quitting smoking can help to reduce your chances of having a baby of low birth weight. HOW DO I GET READY TO QUIT? When you decide to quit smoking, create a plan to make sure that you are successful. Before you quit:  Pick a date to quit. Set a date within the next two weeks to give you time to prepare.  Write down the reasons why you are quitting. Keep this list in places where you will see it often, such as on your bathroom mirror or in your car or wallet.  Identify the people, places, things, and activities that make you want to smoke (triggers) and avoid them. Make sure to take these actions:  Throw away all cigarettes at home, at work, and in your car.  Throw away smoking accessories, such as Scientist, research (medical).  Clean your car and make sure to empty the ashtray.  Clean your home, including curtains and carpets.  Tell your family, friends, and coworkers that you are quitting. Support from your loved ones can make quitting easier.  Talk with your health care provider about your options for quitting smoking.  Find out what treatment options are covered by your health insurance. WHAT STRATEGIES CAN I USE TO QUIT SMOKING?  Talk with your healthcare provider about different strategies to quit smoking. Some strategies include:  Quitting smoking altogether instead of gradually lessening how much you smoke over a period of time. Research shows that quitting "cold [place]" is more successful than gradually quitting.  Attending in-person counseling to help you build problem-solving skills. You are more likely to have success in quitting if you attend several counseling sessions. Even short  sessions of 10 minutes can be effective.  Finding resources and support systems that can help you to quit smoking and remain smoke-free after you quit. These resources are most helpful when you use them often. They can include:  Online chats with a Social worker.  Telephone quitlines.  Printed Furniture conservator/restorer.  Support groups or group counseling.  Text messaging programs.  Mobile phone applications.  Taking medicines to help you quit smoking. (If you are pregnant or breastfeeding, talk with your health care provider first.) Some medicines contain nicotine and some do not. Both types of medicines help with cravings, but the medicines that include nicotine help to relieve withdrawal symptoms. Your health care provider may recommend:  Nicotine patches, gum, or lozenges.  Nicotine inhalers or sprays.  Non-nicotine medicine that is taken by mouth. Talk with your health care provider about combining strategies, such as taking medicines while you are also receiving in-person counseling. Using these two strategies together makes you more likely to succeed in quitting than if you used either strategy  on its own. If you are pregnant or breastfeeding, talk with your health care provider about finding counseling or other support strategies to quit smoking. Do not take medicine to help you quit smoking unless told to do so by your health care provider. WHAT THINGS CAN I DO TO MAKE IT EASIER TO QUIT? Quitting smoking might feel overwhelming at first, but there is a lot that you can do to make it easier. Take these important actions:  Reach out to your family and friends and ask that they support and encourage you during this time. Call telephone quitlines, reach out to support groups, or work with a counselor for support.  Ask people who smoke to avoid smoking around you.  Avoid places that trigger you to smoke, such as bars, parties, or smoke-break areas at work.  Spend time around people who do  not smoke.  Lessen stress in your life, because stress can be a smoking trigger for some people. To lessen stress, try:  Exercising regularly.  Deep-breathing exercises.  Yoga.  Meditating.  Performing a body scan. This involves closing your eyes, scanning your body from head to toe, and noticing which parts of your body are particularly tense. Purposefully relax the muscles in those areas.  Download or purchase mobile phone or tablet apps (applications) that can help you stick to your quit plan by providing reminders, tips, and encouragement. There are many free apps, such as QuitGuide from the [employer] Office manager for Disease Control and Prevention). You can find other support for quitting smoking (smoking cessation) through smokefree.gov and other websites. HOW WILL I FEEL WHEN I QUIT SMOKING? Within the first 24 hours of quitting smoking, you may start to feel some withdrawal symptoms. These symptoms are usually most noticeable 2-3 days after quitting, but they usually do not last beyond 2-3 weeks. Changes or symptoms that you might experience include:  Mood swings.  Restlessness, anxiety, or irritation.  Difficulty concentrating.  Dizziness.  Strong cravings for sugary foods in addition to nicotine.  Mild weight gain.  Constipation.  Nausea.  Coughing or a sore throat.  Changes in how your medicines work in your body.  A depressed mood.  Difficulty sleeping (insomnia). After the first 2-3 weeks of quitting, you may start to notice more positive results, such as:  Improved sense of smell and taste.  Decreased coughing and sore throat.  Slower heart rate.  Lower blood pressure.  Clearer skin.  The ability to breathe more easily.  Fewer sick days. Quitting smoking is very challenging for most people. Do not get discouraged if you are not successful the first time. Some people need to make many attempts to quit before they achieve long-term success. Do your best to  stick to your quit plan, and talk with your health care provider if you have any questions or concerns.   This information is not intended to replace advice given to you by your health care provider. Make sure you discuss any questions you have with your health care provider.   Document Released: 08/01/2001 Document Revised: 12/22/2014 Document Reviewed: 12/22/2014 Elsevier Interactive Patient Education 2016 [employer]. Smoking Hazards Smoking cigarettes is extremely bad for your health. Tobacco smoke has over 200 known poisons in it. It contains the poisonous gases nitrogen oxide and carbon monoxide. There are over 60 chemicals in tobacco smoke that cause cancer. Some of the chemicals found in cigarette smoke include:   Cyanide.   Benzene.   Formaldehyde.   Methanol (wood alcohol).  Acetylene (fuel used in welding torches).   Ammonia.  Even smoking lightly shortens your life expectancy by several years. You can greatly reduce the risk of medical problems for you and your family by stopping now. Smoking is the most preventable cause of death and disease in our society. Within days of quitting smoking, your circulation improves, you decrease the risk of having a heart attack, and your lung capacity improves. There may be some increased phlegm in the first few days after quitting, and it may take months for your lungs to clear up completely. Quitting for 10 years reduces your risk of developing lung cancer to almost that of a nonsmoker.  WHAT ARE THE RISKS OF SMOKING? Cigarette smokers have an increased risk of many serious medical problems, including:  Lung cancer.   Lung disease (such as pneumonia, bronchitis, and emphysema).   Heart attack and chest pain due to the heart not getting enough oxygen (angina).   Heart disease and peripheral blood vessel disease.   Hypertension.   Stroke.   Oral cancer (cancer of the lip, mouth, or voice box).   Bladder cancer.    Pancreatic cancer.   Cervical cancer.   Pregnancy complications, including premature birth.   Stillbirths and smaller newborn babies, birth defects, and genetic damage to sperm.   Early menopause.   Lower estrogen level for women.   Infertility.   Facial wrinkles.   Blindness.   Increased risk of broken bones (fractures).   Senile dementia.   Stomach ulcers and internal bleeding.   Delayed wound healing and increased risk of complications during surgery. Because of secondhand smoke exposure, children of smokers have an increased risk of the following:   Sudden infant death syndrome (SIDS).   Respiratory infections.   Lung cancer.   Heart disease.   Ear infections.  WHY IS SMOKING ADDICTIVE? Nicotine is the chemical agent in tobacco that is capable of causing addiction or dependence. When you smoke and inhale, nicotine is absorbed rapidly into the bloodstream through your lungs. Both inhaled and noninhaled nicotine may be addictive.  WHAT ARE THE BENEFITS OF QUITTING?  There are many health benefits to quitting smoking. Some are:   The likelihood of developing cancer and heart disease decreases. Health improvements are seen almost immediately.   Blood pressure, pulse rate, and breathing patterns start returning to normal soon after quitting.   People who quit may see an improvement in their overall quality of life.  HOW DO YOU QUIT SMOKING? Smoking is an addiction with both physical and psychological effects, and longtime habits can be hard to change. Your health care provider can recommend:  Programs and community resources, which may include group support, education, or therapy.  Replacement products, such as patches, gum, and nasal sprays. Use these products only as directed. Do not replace cigarette smoking with electronic cigarettes (commonly called e-cigarettes). The safety of e-cigarettes is unknown, and some may contain harmful  chemicals. FOR MORE INFORMATION  American Lung Association: www.lung.org  American Cancer Society: www.cancer.org   This information is not intended to replace advice given to you by your health care provider. Make sure you discuss any questions you have with your health care provider.   Document Released: 09/14/2004 Document Revised: 05/28/2013 Document Reviewed: 01/27/2013 Elsevier Interactive Patient Education 2016 [place] WHAT IS SECONDHAND SMOKE? Secondhand smoke is smoke that comes from burning tobacco. It could be the smoke from a cigarette, a pipe, or a cigar. Even if you are not  the one smoking, secondhand smoke exposes you to the dangers of smoking. This is called involuntary, or passive, smoking. There are two types of secondhand smoke:  Sidestream smoke is the smoke that comes off the lighted end of a cigarette, pipe, or cigar.  This type of smoke has the highest amount of cancer-causing agents (carcinogens).  The particles in sidestream smoke are smaller. They get into your lungs more easily.  Mainstream smoke is the smoke that is exhaled by a person who is smoking.  This type of smoke is also dangerous to your health. HOW CAN SECONDHAND SMOKE AFFECT MY HEALTH? Studies show that there is no safe level of secondhand smoke. This smoke contains thousands of chemicals. At least 75 of them are known to cause cancer. Secondhand smoke can also cause many other health problems. It has been linked to:  Lung cancer.  Cancer of the voice box (larynx) or throat.  Cancer of the sinuses.  Brain cancer.  Bladder cancer.  Stomach cancer.  Breast cancer.  White blood cell cancers (lymphoma and leukemia).  Brain and liver tumors in children.  Heart disease and stroke in adults.  Pregnancy loss (miscarriage).  Diseases in children, such as:  Asthma.  Lung infections.  Ear infections.  Sudden infant death syndrome (SIDS).  Slow  growth. WHERE CAN I BE AT RISK FOR EXPOSURE TO SECONDHAND SMOKE?   For adults, the workplace is the main source of exposure to secondhand smoke.  Your workplace should have a policy separating smoking areas from nonsmoking areas.  Smoking areas should have a system for ventilating and cleaning the air.  For children, the home may be the most dangerous place for exposure to secondhand smoke.  Children who live in apartment buildings may be at risk from smoke drifting from hallways or other people's homes.  For everyone, many public places are possible sources of exposure to secondhand smoke.  These places include restaurants, shopping centers, and parks. HOW CAN I REDUCE MY RISK FOR EXPOSURE TO SECONDHAND SMOKE? The most important thing you can do is not smoke. Discourage family members from smoking. Other ways to reduce exposure for you and your family include the following:  Keep your home smoke free.  Make sure your child care providers do not smoke.  Warn your child about the dangers of smoking and secondhand smoke.  Do not allow smoking in your car. When someone smokes in a car, all the damaging chemicals from the smoke are confined in a small area.  Avoid public places where smoking is allowed.   This information is not intended to replace advice given to you by your health care provider. Make sure you discuss any questions you have with your health care provider.   Document Released: 09/14/2004 Document Revised: 08/28/2014 Document Reviewed: 11/21/2013 Elsevier Interactive Patient Education [employer].

## 2015-10-26 ENCOUNTER — Telehealth: Payer: Self-pay | Admitting: *Deleted

## 2015-10-26 ENCOUNTER — Encounter: Payer: Self-pay | Admitting: *Deleted

## 2015-10-26 NOTE — Telephone Encounter (Signed)
Pt requiring PA for Rosuvastatin (Crestor) 10 mg tablet. Drug is not on preferred list pt must have failed at least two preferred Statins. PA has been forwarded to Pharmacist response up to 24 hours. PA# [id number]

## 2015-10-26 NOTE — Progress Notes (Signed)
Faxed completed surgery clearance form back to [facility]. [phone]. Received confirmation. Sent copy to MR.

## 2015-10-27 ENCOUNTER — Telehealth: Payer: Self-pay | Admitting: Cardiovascular Disease

## 2015-10-27 NOTE — Telephone Encounter (Addendum)
Received cardiac clearance request for pt to proceed w/ colonoscopy/egd w/ propofol w/ Dr. Vira Agar @ [facility]. Date TBD pending clearance. Placed on Dr. Donivan Scull desk for review.   Received notification from pt's pharmacy that "Medicaid will not cover brand or generic Crestor, please change to another medication."

## 2015-10-28 MED ORDER — ATORVASTATIN CALCIUM 40 MG PO TABS: 40.0000 mg | ORAL_TABLET | Freq: Every day | ORAL | Status: DC

## 2015-10-28 NOTE — Telephone Encounter (Signed)
Acceptable risk for surgery, no further testing needed  Would change Crestor to Lipitor 40 mg daily

## 2015-10-28 NOTE — Telephone Encounter (Signed)
Pt has been denied coverage for Generic/Brand name Crestor 10 mg tablet. Pt must have failed at lease 2 from the preferred list below: Atorvastatin Simvastatin Lovastatin Pravastatin Pt stated that she has never failed or tried any of the above. Please advise.

## 2015-10-28 NOTE — Telephone Encounter (Signed)
Please see note below. 

## 2015-10-28 NOTE — Telephone Encounter (Signed)
Clearance faxed to [facility] @ [phone]. Lipitor rx sent to [place]

## 2015-11-19 ENCOUNTER — Encounter: Payer: Self-pay | Admitting: *Deleted

## 2015-11-22 ENCOUNTER — Ambulatory Visit: Payer: Medicaid Other | Admitting: Certified Registered Nurse Anesthetist

## 2015-11-22 ENCOUNTER — Encounter
Admission: RE | Disposition: A | Discharge status: Home / Self Care | Payer: Self-pay | Source: Ambulatory Visit | Attending: Unknown Physician Specialty

## 2015-11-22 ENCOUNTER — Ambulatory Visit
Admission: RE | Admit: 2015-11-22 | Discharge: 2015-11-22 | Disposition: A | Discharge status: Home / Self Care | Payer: Medicaid Other | Source: Ambulatory Visit | Attending: Unknown Physician Specialty | Admitting: Unknown Physician Specialty

## 2015-11-22 ENCOUNTER — Encounter: Payer: Self-pay | Admitting: *Deleted

## 2015-11-22 DIAGNOSIS — K648 Other hemorrhoids: Secondary | ICD-10-CM | POA: Insufficient documentation

## 2015-11-22 DIAGNOSIS — R1011 Right upper quadrant pain: Secondary | ICD-10-CM | POA: Diagnosis present

## 2015-11-22 DIAGNOSIS — J449 Chronic obstructive pulmonary disease, unspecified: Secondary | ICD-10-CM | POA: Diagnosis not present

## 2015-11-22 DIAGNOSIS — F419 Anxiety disorder, unspecified: Secondary | ICD-10-CM | POA: Insufficient documentation

## 2015-11-22 DIAGNOSIS — R1031 Right lower quadrant pain: Secondary | ICD-10-CM | POA: Diagnosis not present

## 2015-11-22 DIAGNOSIS — Z823 Family history of stroke: Secondary | ICD-10-CM | POA: Insufficient documentation

## 2015-11-22 DIAGNOSIS — Z836 Family history of other diseases of the respiratory system: Secondary | ICD-10-CM | POA: Insufficient documentation

## 2015-11-22 DIAGNOSIS — Z8673 Personal history of transient ischemic attack (TIA), and cerebral infarction without residual deficits: Secondary | ICD-10-CM | POA: Diagnosis not present

## 2015-11-22 DIAGNOSIS — K2970 Gastritis, unspecified, without bleeding: Secondary | ICD-10-CM | POA: Insufficient documentation

## 2015-11-22 DIAGNOSIS — K621 Rectal polyp: Secondary | ICD-10-CM | POA: Diagnosis not present

## 2015-11-22 DIAGNOSIS — Z7982 Long term (current) use of aspirin: Secondary | ICD-10-CM | POA: Insufficient documentation

## 2015-11-22 DIAGNOSIS — F17210 Nicotine dependence, cigarettes, uncomplicated: Secondary | ICD-10-CM | POA: Diagnosis not present

## 2015-11-22 DIAGNOSIS — F329 Major depressive disorder, single episode, unspecified: Secondary | ICD-10-CM | POA: Diagnosis not present

## 2015-11-22 DIAGNOSIS — Z880 Allergy status to penicillin: Secondary | ICD-10-CM | POA: Diagnosis not present

## 2015-11-22 DIAGNOSIS — Z8249 Family history of ischemic heart disease and other diseases of the circulatory system: Secondary | ICD-10-CM | POA: Diagnosis not present

## 2015-11-22 DIAGNOSIS — Z79899 Other long term (current) drug therapy: Secondary | ICD-10-CM | POA: Insufficient documentation

## 2015-11-22 HISTORY — DX: Irritable bowel syndrome, unspecified: K58.9

## 2015-11-22 HISTORY — PX: ESOPHAGOGASTRODUODENOSCOPY (EGD) WITH PROPOFOL: SHX5813

## 2015-11-22 HISTORY — PX: COLONOSCOPY WITH PROPOFOL: SHX5780

## 2015-11-22 SURGERY — COLONOSCOPY WITH PROPOFOL
Anesthesia: General

## 2015-11-22 MED ORDER — SODIUM CHLORIDE 0.9 % IV SOLN: INTRAVENOUS | Status: DC

## 2015-11-22 MED ORDER — PROPOFOL 500 MG/50ML IV EMUL
INTRAVENOUS | Status: DC | PRN
  Administered 2015-11-22: 120 ug/kg/min via INTRAVENOUS

## 2015-11-22 MED ORDER — MIDAZOLAM HCL 2 MG/2ML IJ SOLN
INTRAMUSCULAR | Status: DC | PRN
  Administered 2015-11-22: 1 mg via INTRAVENOUS

## 2015-11-22 MED ORDER — PROPOFOL 10 MG/ML IV BOLUS
INTRAVENOUS | Status: DC | PRN
  Administered 2015-11-22 (×2): 20 mg via INTRAVENOUS

## 2015-11-22 MED ORDER — SODIUM CHLORIDE 0.9 % IV SOLN
INTRAVENOUS | Status: DC
  Administered 2015-11-22: 1000 mL via INTRAVENOUS

## 2015-11-22 MED ORDER — GLYCOPYRROLATE 0.2 MG/ML IJ SOLN
INTRAMUSCULAR | Status: DC | PRN
  Administered 2015-11-22: 0.2 mg via INTRAVENOUS

## 2015-11-22 MED ORDER — LIDOCAINE HCL (CARDIAC) 20 MG/ML IV SOLN
INTRAVENOUS | Status: DC | PRN
  Administered 2015-11-22: 60 mg via INTRAVENOUS

## 2015-11-22 NOTE — Op Note (Signed)
[facility] Gastroenterology Patient Name: Lindsey Bautista Procedure Date: 11/22/2015 1:28 PM MRN: [id number] Account #: [id number] Date of Birth: 01-30-1961 Admit Type: Inpatient Age: 55 Room: [facility] ENDO ROOM 1 Gender: Female Note Status: Finalized Procedure:            Upper GI endoscopy Indications:          Abdominal pain in the right upper quadrant Providers:            Manya Silvas, MD Referring MD:         [name], MD (Referring MD) Medicines:            Propofol per Anesthesia Complications:        No immediate complications. Procedure:            Pre-Anesthesia Assessment:                       - After reviewing the risks and benefits, the patient                        was deemed in satisfactory condition to undergo the                        procedure.                       After obtaining informed consent, the endoscope was                        passed under direct vision. Throughout the procedure,                        the patient's blood pressure, pulse, and oxygen                        saturations were monitored continuously. The Endoscope                        was introduced through the mouth, and advanced to the                        second part of duodenum. The upper GI endoscopy was                        accomplished without difficulty. The patient tolerated                        the procedure well. Findings:      The examined esophagus was normal.      Localized mildly erythematous mucosa without bleeding was found in the       gastric antrum. Biopsies were taken with a cold forceps for histology.       Biopsies were taken with a cold forceps for Helicobacter pylori testing.      The examined duodenum was normal. Impression:           - Normal esophagus.                       - Erythematous mucosa in the antrum. Biopsied.                       -  Normal examined duodenum. Recommendation:       - Await pathology  results. Procedure Code(s):    --- Professional ---                       [phone], Esophagogastroduodenoscopy, flexible, transoral;                        with biopsy, single or multiple Diagnosis Code(s):    --- Professional ---                       K31.89, Other diseases of stomach and duodenum                       R10.11, Right upper quadrant pain CPT copyright 2016 American Medical Association. All rights reserved. The codes documented in this report are preliminary and upon coder review may  be revised to meet current compliance requirements. Manya Silvas, MD 11/22/2015 1:47:31 PM This report has been signed electronically. Number of Addenda: 0 Note Initiated On: 11/22/2015 1:28 PM      [facility]

## 2015-11-22 NOTE — Anesthesia Postprocedure Evaluation (Signed)
Anesthesia Post Note  Patient: SANDRA KOSMALSKI  Procedure(s) Performed: Procedure(s) (LRB): COLONOSCOPY WITH PROPOFOL (N/A) ESOPHAGOGASTRODUODENOSCOPY (EGD) WITH PROPOFOL (N/A)  Patient location during evaluation: Endoscopy Anesthesia Type: General Level of consciousness: awake and alert Pain management: pain level controlled Vital Signs Assessment: post-procedure vital signs reviewed and stable Respiratory status: spontaneous breathing, nonlabored ventilation, respiratory function stable and patient connected to nasal cannula oxygen Cardiovascular status: blood pressure returned to baseline and stable Postop Assessment: no signs of nausea or vomiting Anesthetic complications: no    Last Vitals:  Filed Vitals:   11/22/15 1427 11/22/15 1437  BP: 107/67 98/72  Pulse: 70 63  Temp:    Resp: 12 13    Last Pain: There were no vitals filed for this visit.               Martha Clan

## 2015-11-22 NOTE — Transfer of Care (Signed)
Immediate Anesthesia Transfer of Care Note  Patient: Lindsey Bautista  Procedure(s) Performed: Procedure(s): COLONOSCOPY WITH PROPOFOL (N/A) ESOPHAGOGASTRODUODENOSCOPY (EGD) WITH PROPOFOL (N/A)  Patient Location: PACU  Anesthesia Type:General  Level of Consciousness: sedated  Airway & Oxygen Therapy: Patient Spontanous Breathing and Patient connected to nasal cannula oxygen  Post-op Assessment: Report given to RN and Post -op Vital signs reviewed and stable  Post vital signs: Reviewed and stable  Last Vitals:  Filed Vitals:   11/22/15 1306 11/22/15 1407  BP: 118/54 97/56  Pulse: 70 68  Temp: 36.6 C 36.1 C  Resp: 18 15    Complications: No apparent anesthesia complications

## 2015-11-22 NOTE — H&P (Signed)
Primary Care Physician:  Golden Pop, MD Primary Gastroenterologist:  Dr. Vira Agar  Pre-Procedure History & Physical: HPI:  Lindsey Bautista is a 55 y.o. female is here for an endoscopy and colonoscopy.   Past Medical History  Diagnosis Date  . Depression   . Anxiety   . Hypotension   . Right arm weakness   . Stroke ([place])   . COPD (chronic obstructive pulmonary disease) ([place])   . Situational syncope   . Seizures ([place])   . IBS (irritable bowel syndrome)     Past Surgical History  Procedure Laterality Date  . Vaginal hysterectomy    . Tonsillectomy      Prior to Admission medications   Medication Sig Start Date End Date Taking? Authorizing Provider  aspirin 81 MG tablet Take 81 mg by mouth daily.   Yes Historical Provider, MD  clonazePAM (KLONOPIN) 1 MG tablet Take 1 tablet (1 mg total) by mouth 2 (two) times daily as needed for anxiety. No alcohol with this 09/30/15  Yes [name], MD  cyclobenzaprine (FLEXERIL) 5 MG tablet Take 5 mg by mouth 3 (three) times daily as needed for muscle spasms.   Yes Historical Provider, MD  estradiol (ESTRACE) 1 MG tablet Take 1 mg by mouth daily.   Yes Historical Provider, MD  FLUoxetine (PROZAC) 20 MG capsule Take 20 mg by mouth daily.   Yes Historical Provider, MD  omeprazole (PRILOSEC) 20 MG capsule Take 20 mg by mouth daily.   Yes Historical Provider, MD  vitamin B-12 (CYANOCOBALAMIN) 1000 MCG tablet Take 1,000 mcg by mouth daily.   Yes Historical Provider, MD  atorvastatin (LIPITOR) 40 MG tablet Take 1 tablet (40 mg total) by mouth daily. 10/28/15   Minna Merritts, MD    Allergies as of 11/12/2015 - Review Complete 10/22/2015  Allergen Reaction Noted  . Penicillin g Anaphylaxis and Other (See Comments) 12/17/2014  . Iodinated diagnostic agents Rash and Nausea And Vomiting 12/17/2014    Family History  Problem Relation Age of Onset  . Heart disease    . COPD    . Stroke      Social History   Social History  . Marital  Status: Married    Spouse Name: Aedan Burford  . Number of Children: 2  . Years of Education: 12   Occupational History  . Unemployed    Social History Main Topics  . Smoking status: Current Every Day Smoker -- 1.00 packs/day for 25 years    Types: Cigarettes  . Smokeless tobacco: Never Used  . Alcohol Use: No  . Drug Use: No  . Sexual Activity: Not on file   Other Topics Concern  . Not on file   Social History Narrative   Lives at home with husband.    Caffeine use: 1 cup coffee per day    Review of Systems: See HPI, otherwise negative ROS  Physical Exam: BP 118/54 mmHg  Pulse 70  Temp(Src) 97.9 F (36.6 C) (Tympanic)  Resp 18  Ht 5' (1.524 m)  Wt 61.689 kg (136 lb)  BMI 26.56 kg/m2  SpO2 100% General:   Alert,  pleasant and cooperative in NAD Head:  Normocephalic and atraumatic. Neck:  Supple; no masses or thyromegaly. Lungs:  Clear throughout to auscultation.    Heart:  Regular rate and rhythm. Abdomen:  Soft, nontender and nondistended. Normal bowel sounds, without guarding, and without rebound.   Neurologic:  Alert and  oriented x4;  grossly normal neurologically.  Impression/Plan: Lindsey Bautista is here for an endoscopy and colonoscopy to be performed for RUQ abd pain, RLQ abd pain, abnormal esophagus   Risks, benefits, limitations, and alternatives regarding  endoscopy and colonoscopy have been reviewed with the patient.  Questions have been answered.  All parties agreeable.   Gaylyn Cheers, MD  11/22/2015, 1:35 PM

## 2015-11-22 NOTE — Anesthesia Preprocedure Evaluation (Signed)
Anesthesia Evaluation  Patient identified by MRN, date of birth, ID band Patient awake    Reviewed: Allergy & Precautions, H&P , NPO status , Patient's Chart, lab work & pertinent test results, reviewed documented beta blocker date and time   History of Anesthesia Complications Negative for: history of anesthetic complications  Airway Mallampati: III  TM Distance: >3 FB Neck ROM: full    Dental no notable dental hx. (+) Edentulous Upper, Upper Dentures   Pulmonary neg shortness of breath, neg sleep apnea, COPD (mild), neg recent URI, Current Smoker,    Pulmonary exam normal breath sounds clear to auscultation       Cardiovascular Exercise Tolerance: Good (-) hypertension(-) angina+ Peripheral Vascular Disease  (-) CAD, (-) Past MI, (-) Cardiac Stents and (-) CABG Normal cardiovascular exam(-) dysrhythmias (-) Valvular Problems/Murmurs Rhythm:regular Rate:Normal     Neuro/Psych Seizures - (many years ago, no longer on medication),  PSYCHIATRIC DISORDERS (Depression) CVA, Residual Symptoms    GI/Hepatic Neg liver ROS, GERD  ,  Endo/Other  negative endocrine ROS  Renal/GU negative Renal ROS  negative genitourinary   Musculoskeletal   Abdominal   Peds  Hematology negative hematology ROS (+)   Anesthesia Other Findings Past Medical History:   Depression                                                   Anxiety                                                      Hypotension                                                  Right arm weakness                                           Stroke (HCC)                                                 COPD (chronic obstructive pulmonary disease) (*              Situational syncope                                          Seizures (HCC)                                               IBS (irritable bowel syndrome)  Reproductive/Obstetrics negative OB  ROS                             Anesthesia Physical Anesthesia Plan  ASA: II  Anesthesia Plan: General   Post-op Pain Management:    Induction:   Airway Management Planned:   Additional Equipment:   Intra-op Plan:   Post-operative Plan:   Informed Consent: I have reviewed the patients History and Physical, chart, labs and discussed the procedure including the risks, benefits and alternatives for the proposed anesthesia with the patient or authorized representative who has indicated his/her understanding and acceptance.   Dental Advisory Given  Plan Discussed with: Anesthesiologist, CRNA and Surgeon  Anesthesia Plan Comments:         Anesthesia Quick Evaluation

## 2015-11-22 NOTE — Op Note (Signed)
[facility] Gastroenterology Patient Name: Lindsey Bautista Procedure Date: 11/22/2015 1:27 PM MRN: [id number] Account #: [id number] Date of Birth: 05/06/1961 Admit Type: Outpatient Age: 55 Room: [facility] ENDO ROOM 1 Gender: Female Note Status: Finalized Procedure:            Colonoscopy Indications:          Abdominal pain in the right lower quadrant, Abdominal                        pain in the right upper quadrant Providers:            Manya Silvas, MD Referring MD:         [name], MD (Referring MD) Medicines:            Propofol per Anesthesia Complications:        No immediate complications. Procedure:            Pre-Anesthesia Assessment:                       - After reviewing the risks and benefits, the patient                        was deemed in satisfactory condition to undergo the                        procedure.                       After obtaining informed consent, the colonoscope was                        passed under direct vision. Throughout the procedure,                        the patient's blood pressure, pulse, and oxygen                        saturations were monitored continuously. The                        Colonoscope was introduced through the anus and                        advanced to the the cecum, identified by appendiceal                        orifice and ileocecal valve. The colonoscopy was                        performed without difficulty. The patient tolerated the                        procedure well. The quality of the bowel preparation                        was excellent. Findings:      A diminutive polyp was found in the rectum. The polyp was sessile. The       polyp was removed with a jumbo cold forceps. Resection and retrieval       were complete.      The  exam was otherwise without abnormality. Impression:           - One diminutive polyp in the rectum, removed with a                        jumbo cold  forceps. Resected and retrieved.                       - The examination was otherwise normal. Recommendation:       - Await pathology results. Manya Silvas, MD 11/22/2015 2:08:05 PM This report has been signed electronically. Number of Addenda: 0 Note Initiated On: 11/22/2015 1:27 PM Scope Withdrawal Time: 0 hours 9 minutes 36 seconds  Total Procedure Duration: 0 hours 15 minutes 0 seconds       [facility]

## 2015-11-23 ENCOUNTER — Encounter: Payer: Self-pay | Admitting: Unknown Physician Specialty

## 2015-11-24 LAB — SURGICAL PATHOLOGY

## 2015-12-13 ENCOUNTER — Emergency Department (HOSPITAL_COMMUNITY): Payer: Medicaid Other

## 2015-12-13 ENCOUNTER — Observation Stay (HOSPITAL_COMMUNITY): Payer: Medicaid Other

## 2015-12-13 ENCOUNTER — Encounter (HOSPITAL_COMMUNITY): Payer: Self-pay | Admitting: Internal Medicine

## 2015-12-13 ENCOUNTER — Observation Stay (HOSPITAL_COMMUNITY)
Admission: EM | Admit: 2015-12-13 | Discharge: 2015-12-15 | Disposition: A | Discharge status: Home / Self Care | Payer: Medicaid Other | Attending: Internal Medicine | Admitting: Internal Medicine

## 2015-12-13 DIAGNOSIS — R4781 Slurred speech: Secondary | ICD-10-CM | POA: Insufficient documentation

## 2015-12-13 DIAGNOSIS — F17210 Nicotine dependence, cigarettes, uncomplicated: Secondary | ICD-10-CM | POA: Diagnosis not present

## 2015-12-13 DIAGNOSIS — F329 Major depressive disorder, single episode, unspecified: Secondary | ICD-10-CM | POA: Diagnosis not present

## 2015-12-13 DIAGNOSIS — Z79899 Other long term (current) drug therapy: Secondary | ICD-10-CM | POA: Diagnosis not present

## 2015-12-13 DIAGNOSIS — Z880 Allergy status to penicillin: Secondary | ICD-10-CM | POA: Insufficient documentation

## 2015-12-13 DIAGNOSIS — Z8673 Personal history of transient ischemic attack (TIA), and cerebral infarction without residual deficits: Secondary | ICD-10-CM | POA: Insufficient documentation

## 2015-12-13 DIAGNOSIS — I639 Cerebral infarction, unspecified: Secondary | ICD-10-CM | POA: Diagnosis present

## 2015-12-13 DIAGNOSIS — I6320 Cerebral infarction due to unspecified occlusion or stenosis of unspecified precerebral arteries: Secondary | ICD-10-CM

## 2015-12-13 DIAGNOSIS — Z7982 Long term (current) use of aspirin: Secondary | ICD-10-CM | POA: Insufficient documentation

## 2015-12-13 DIAGNOSIS — R262 Difficulty in walking, not elsewhere classified: Secondary | ICD-10-CM | POA: Insufficient documentation

## 2015-12-13 DIAGNOSIS — R531 Weakness: Secondary | ICD-10-CM | POA: Diagnosis not present

## 2015-12-13 DIAGNOSIS — I739 Peripheral vascular disease, unspecified: Secondary | ICD-10-CM | POA: Diagnosis present

## 2015-12-13 DIAGNOSIS — J449 Chronic obstructive pulmonary disease, unspecified: Secondary | ICD-10-CM | POA: Diagnosis not present

## 2015-12-13 DIAGNOSIS — R269 Unspecified abnormalities of gait and mobility: Secondary | ICD-10-CM | POA: Insufficient documentation

## 2015-12-13 DIAGNOSIS — R55 Syncope and collapse: Secondary | ICD-10-CM | POA: Insufficient documentation

## 2015-12-13 DIAGNOSIS — F419 Anxiety disorder, unspecified: Secondary | ICD-10-CM | POA: Insufficient documentation

## 2015-12-13 DIAGNOSIS — K219 Gastro-esophageal reflux disease without esophagitis: Secondary | ICD-10-CM | POA: Insufficient documentation

## 2015-12-13 DIAGNOSIS — G459 Transient cerebral ischemic attack, unspecified: Secondary | ICD-10-CM

## 2015-12-13 DIAGNOSIS — Z720 Tobacco use: Secondary | ICD-10-CM

## 2015-12-13 DIAGNOSIS — R079 Chest pain, unspecified: Secondary | ICD-10-CM

## 2015-12-13 DIAGNOSIS — R29898 Other symptoms and signs involving the musculoskeletal system: Secondary | ICD-10-CM

## 2015-12-13 DIAGNOSIS — E785 Hyperlipidemia, unspecified: Secondary | ICD-10-CM | POA: Diagnosis not present

## 2015-12-13 DIAGNOSIS — F17200 Nicotine dependence, unspecified, uncomplicated: Secondary | ICD-10-CM

## 2015-12-13 DIAGNOSIS — I959 Hypotension, unspecified: Secondary | ICD-10-CM | POA: Diagnosis not present

## 2015-12-13 DIAGNOSIS — F339 Major depressive disorder, recurrent, unspecified: Secondary | ICD-10-CM | POA: Diagnosis present

## 2015-12-13 DIAGNOSIS — N644 Mastodynia: Secondary | ICD-10-CM | POA: Diagnosis present

## 2015-12-13 DIAGNOSIS — J432 Centrilobular emphysema: Secondary | ICD-10-CM | POA: Diagnosis present

## 2015-12-13 HISTORY — DX: Gastro-esophageal reflux disease without esophagitis: K21.9

## 2015-12-13 LAB — DIFFERENTIAL
Basophils Absolute: 0 10*3/uL (ref 0.0–0.1)
Basophils Relative: 1 %
Eosinophils Absolute: 0.1 10*3/uL (ref 0.0–0.7)
Eosinophils Relative: 1 %
LYMPHS PCT: 37 %
Lymphs Abs: 2.2 10*3/uL (ref 0.7–4.0)
Monocytes Absolute: 0.5 10*3/uL (ref 0.1–1.0)
Monocytes Relative: 9 %
NEUTROS ABS: 3.2 10*3/uL (ref 1.7–7.7)
NEUTROS PCT: 52 %

## 2015-12-13 LAB — I-STAT CHEM 8, ED
BUN: 6 mg/dL (ref 6–20)
CHLORIDE: 101 mmol/L (ref 101–111)
Calcium, Ion: 1.06 mmol/L — ABNORMAL LOW (ref 1.12–1.23)
Creatinine, Ser: 0.7 mg/dL (ref 0.44–1.00)
Glucose, Bld: 81 mg/dL (ref 65–99)
HEMATOCRIT: 42 % (ref 36.0–46.0)
Hemoglobin: 14.3 g/dL (ref 12.0–15.0)
Potassium: 3.9 mmol/L (ref 3.5–5.1)
SODIUM: 141 mmol/L (ref 135–145)
TCO2: 27 mmol/L (ref 0–100)

## 2015-12-13 LAB — CBC
HCT: 40.7 % (ref 36.0–46.0)
Hemoglobin: 13.4 g/dL (ref 12.0–15.0)
MCH: 29.7 pg (ref 26.0–34.0)
MCHC: 32.9 g/dL (ref 30.0–36.0)
MCV: 90.2 fL (ref 78.0–100.0)
PLATELETS: 269 10*3/uL (ref 150–400)
RBC: 4.51 MIL/uL (ref 3.87–5.11)
RDW: 12.9 % (ref 11.5–15.5)
WBC: 6 10*3/uL (ref 4.0–10.5)

## 2015-12-13 LAB — I-STAT TROPONIN, ED: TROPONIN I, POC: 0 ng/mL (ref 0.00–0.08)

## 2015-12-13 LAB — COMPREHENSIVE METABOLIC PANEL
ALBUMIN: 3.6 g/dL (ref 3.5–5.0)
ALK PHOS: 59 U/L (ref 38–126)
ALT: 10 U/L — AB (ref 14–54)
ANION GAP: 11 (ref 5–15)
AST: 16 U/L (ref 15–41)
BUN: <5 mg/dL — ABNORMAL LOW (ref 6–20)
CALCIUM: 8.7 mg/dL — AB (ref 8.9–10.3)
CHLORIDE: 104 mmol/L (ref 101–111)
CO2: 26 mmol/L (ref 22–32)
CREATININE: 0.82 mg/dL (ref 0.44–1.00)
GFR calc Af Amer: >60 mL/min (ref >60)
GFR calc non Af Amer: >60 mL/min (ref >60)
Glucose, Bld: 86 mg/dL (ref 65–99)
Potassium: 4 mmol/L (ref 3.5–5.1)
SODIUM: 141 mmol/L (ref 135–145)
Total Bilirubin: 0.5 mg/dL (ref 0.3–1.2)
Total Protein: 6.3 g/dL — ABNORMAL LOW (ref 6.5–8.1)

## 2015-12-13 LAB — URINALYSIS, ROUTINE W REFLEX MICROSCOPIC
Bilirubin Urine: NEGATIVE
Glucose, UA: NEGATIVE mg/dL
Hgb urine dipstick: NEGATIVE
Ketones, ur: NEGATIVE mg/dL
Leukocytes, UA: NEGATIVE
NITRITE: NEGATIVE
PH: 7 (ref 5.0–8.0)
Protein, ur: NEGATIVE mg/dL
SPECIFIC GRAVITY, URINE: 1.01 (ref 1.005–1.030)

## 2015-12-13 LAB — ETHANOL: Alcohol, Ethyl (B): <5 mg/dL (ref <5)

## 2015-12-13 LAB — APTT: APTT: 29 s (ref 24–37)

## 2015-12-13 LAB — PROTIME-INR
INR: 1.02 (ref 0.00–1.49)
PROTHROMBIN TIME: 13.6 s (ref 11.6–15.2)

## 2015-12-13 LAB — RAPID URINE DRUG SCREEN, HOSP PERFORMED
Amphetamines: NOT DETECTED
Barbiturates: NOT DETECTED
Benzodiazepines: NOT DETECTED
Cocaine: NOT DETECTED
OPIATES: NOT DETECTED
Tetrahydrocannabinol: NOT DETECTED

## 2015-12-13 MED ORDER — STROKE: EARLY STAGES OF RECOVERY BOOK
Freq: Once | Status: DC
  Filled 2015-12-13: qty 1

## 2015-12-13 MED ORDER — ATORVASTATIN CALCIUM 40 MG PO TABS
40.0000 mg | ORAL_TABLET | Freq: Every day | ORAL | Status: DC
  Administered 2015-12-13 – 2015-12-14 (×2): 40 mg via ORAL
  Filled 2015-12-13 (×2): qty 1

## 2015-12-13 MED ORDER — ASPIRIN 325 MG PO TABS
325.0000 mg | ORAL_TABLET | Freq: Every day | ORAL | Status: DC
  Administered 2015-12-13 – 2015-12-15 (×3): 325 mg via ORAL
  Filled 2015-12-13 (×3): qty 1

## 2015-12-13 MED ORDER — PANTOPRAZOLE SODIUM 40 MG PO TBEC
40.0000 mg | DELAYED_RELEASE_TABLET | Freq: Every day | ORAL | Status: DC
  Administered 2015-12-14 – 2015-12-15 (×2): 40 mg via ORAL
  Filled 2015-12-13 (×3): qty 1

## 2015-12-13 MED ORDER — HEPARIN SODIUM (PORCINE) 5000 UNIT/ML IJ SOLN
5000.0000 [IU] | Freq: Three times a day (TID) | INTRAMUSCULAR | Status: DC
  Administered 2015-12-13 – 2015-12-15 (×4): 5000 [IU] via SUBCUTANEOUS
  Filled 2015-12-13 (×6): qty 1

## 2015-12-13 MED ORDER — SENNOSIDES-DOCUSATE SODIUM 8.6-50 MG PO TABS
1.0000 | ORAL_TABLET | Freq: Every evening | ORAL | Status: DC | PRN
  Filled 2015-12-13: qty 1

## 2015-12-13 MED ORDER — SODIUM CHLORIDE 0.9 % IV SOLN
INTRAVENOUS | Status: DC
  Administered 2015-12-13: 23:00:00 via INTRAVENOUS

## 2015-12-13 MED ORDER — VITAMIN B-12 1000 MCG PO TABS
1000.0000 ug | ORAL_TABLET | Freq: Every day | ORAL | Status: DC
  Administered 2015-12-14 – 2015-12-15 (×2): 1000 ug via ORAL
  Filled 2015-12-13 (×2): qty 1

## 2015-12-13 MED ORDER — NICOTINE 21 MG/24HR TD PT24
21.0000 mg | MEDICATED_PATCH | Freq: Every day | TRANSDERMAL | Status: DC
  Filled 2015-12-13 (×2): qty 1

## 2015-12-13 MED ORDER — CLONAZEPAM 0.5 MG PO TABS
1.0000 mg | ORAL_TABLET | Freq: Two times a day (BID) | ORAL | Status: DC
  Administered 2015-12-13 – 2015-12-14 (×2): 1 mg via ORAL
  Filled 2015-12-13 (×2): qty 2

## 2015-12-13 MED ORDER — ASPIRIN 300 MG RE SUPP: 300.0000 mg | Freq: Every day | RECTAL | Status: DC

## 2015-12-13 MED ORDER — FLUOXETINE HCL 20 MG PO CAPS
20.0000 mg | ORAL_CAPSULE | Freq: Every day | ORAL | Status: DC
  Administered 2015-12-14 – 2015-12-15 (×2): 20 mg via ORAL
  Filled 2015-12-13 (×2): qty 1

## 2015-12-13 MED ORDER — MORPHINE SULFATE (PF) 2 MG/ML IV SOLN
2.0000 mg | INTRAVENOUS | Status: DC | PRN
  Administered 2015-12-15 (×2): 2 mg via INTRAVENOUS
  Filled 2015-12-13 (×2): qty 1

## 2015-12-13 MED ORDER — ONDANSETRON HCL 4 MG/2ML IJ SOLN
4.0000 mg | Freq: Once | INTRAMUSCULAR | Status: AC
  Administered 2015-12-13: 4 mg via INTRAVENOUS
  Filled 2015-12-13: qty 2

## 2015-12-13 MED ORDER — NITROGLYCERIN 0.4 MG SL SUBL: 0.4000 mg | SUBLINGUAL_TABLET | SUBLINGUAL | Status: DC | PRN

## 2015-12-13 MED ORDER — HYDROXYZINE HCL 50 MG/ML IM SOLN
25.0000 mg | Freq: Four times a day (QID) | INTRAMUSCULAR | Status: DC | PRN
  Filled 2015-12-13: qty 0.5

## 2015-12-13 MED ORDER — CYCLOBENZAPRINE HCL 10 MG PO TABS: 5.0000 mg | ORAL_TABLET | Freq: Three times a day (TID) | ORAL | Status: DC | PRN

## 2015-12-13 MED ORDER — LORAZEPAM 2 MG/ML IJ SOLN
1.0000 mg | Freq: Once | INTRAMUSCULAR | Status: AC
  Administered 2015-12-13: 1 mg via INTRAVENOUS
  Filled 2015-12-13: qty 1

## 2015-12-13 NOTE — Consult Note (Signed)
Admission H&P    Chief Complaint: New onset right-sided weakness.  HPI: Lindsey Bautista is an 55 y.o. female with a history of depression and anxiety, TIA, syncope and seizures as well as irritable bowel syndrome, presenting with new onset weakness involving right upper and lower extremities. She was last known well at 5:30 PM today. No facial droop was described. Speech was low in volume but not slurred. CT scan of her head showed no acute intracranial abnormality. She demonstrated poor effort with resting of strength of right extremities and that was clear in consistency strength, particularly when distracted. No facial droop was noted. NIH stroke score was 7. Patient was not deemed a candidate for TPA, as no clear objective weakness as demonstrated on exam.  LSN: 5:30 PM on 12/13/2015 tPA Given: No: No objective acute deficits mRankin:  Past Medical History  Diagnosis Date  . Depression   . Anxiety   . Hypotension   . Right arm weakness   . Stroke ([place])   . COPD (chronic obstructive pulmonary disease) ([place])   . Situational syncope   . Seizures ([place])   . IBS (irritable bowel syndrome)     Past Surgical History  Procedure Laterality Date  . Vaginal hysterectomy    . Tonsillectomy    . Colonoscopy with propofol N/A 11/22/2015    Procedure: COLONOSCOPY WITH PROPOFOL;  Surgeon: Manya Silvas, MD;  Location: [facility] ENDOSCOPY;  Service: Endoscopy;  Laterality: N/A;  . Esophagogastroduodenoscopy (egd) with propofol N/A 11/22/2015    Procedure: ESOPHAGOGASTRODUODENOSCOPY (EGD) WITH PROPOFOL;  Surgeon: Manya Silvas, MD;  Location: [facility] ENDOSCOPY;  Service: Endoscopy;  Laterality: N/A;    Family History  Problem Relation Age of Onset  . Heart disease    . COPD    . Stroke     Social History:  reports that she has been smoking Cigarettes.  She has a 25 pack-year smoking history. She has never used smokeless tobacco. She reports that she does not drink alcohol or use illicit  drugs.  Allergies:  Allergies  Allergen Reactions  . Penicillin G Anaphylaxis and Other (See Comments)    Has patient had a PCN reaction causing immediate rash, facial/tongue/throat swelling, SOB or lightheadedness with hypotension: [id number] Has patient had a PCN reaction causing severe rash involving mucus membranes or skin necrosis: [id number]} Has patient had a PCN reaction that required hospitalization no:[id number]} Has patient had a PCN reaction occurring within the last 10 years: [id number] If all of the above answers are "NO", then may proceed with Cephalosporin use.   . Iodinated Diagnostic Agents Rash and Nausea And Vomiting    Medications: Preadmission medications were reviewed by me.  ROS: History obtained from the patient  General ROS: negative for - chills, fatigue, fever, night sweats, weight gain or weight loss Psychological ROS: negative for - behavioral disorder, hallucinations, memory difficulties, mood swings or suicidal ideation Ophthalmic ROS: negative for - blurry vision, double vision, eye pain or loss of vision ENT ROS: negative for - epistaxis, nasal discharge, oral lesions, sore throat, tinnitus or vertigo Allergy and Immunology ROS: negative for - hives or itchy/watery eyes Hematological and Lymphatic ROS: negative for - bleeding problems, bruising or swollen lymph nodes Endocrine ROS: negative for - galactorrhea, hair pattern changes, polydipsia/polyuria or temperature intolerance Respiratory ROS: negative for - cough, hemoptysis, shortness of breath or wheezing Cardiovascular ROS: negative for - chest pain, dyspnea on exertion, edema or irregular heartbeat Gastrointestinal ROS: Right upper and lower abdominal pain with recent  upper and lower GI studies performed Genito-Urinary ROS: negative for - dysuria, hematuria, incontinence or urinary frequency/urgency Musculoskeletal ROS: negative for - joint swelling or muscular weakness Neurological ROS:  as noted in HPI Dermatological ROS: negative for rash and skin lesion changes  Physical Examination: Blood pressure 139/77, pulse 59, temperature 98.7 F (37.1 C), temperature source Oral, resp. rate 14, height 5\' 2"  (1.575 m), weight 63.504 kg (140 lb), SpO2 99 %.  HEENT-  Normocephalic, no lesions, without obvious abnormality.  Normal external eye and conjunctiva.  Normal TM's bilaterally.  Normal auditory canals and external ears. Normal external nose, mucus membranes and septum.  Normal pharynx. Neck supple with no masses, nodes, nodules or enlargement. Cardiovascular - regular rate and rhythm, S1, S2 normal, no murmur, click, rub or gallop Lungs - chest clear, no wheezing, rales, normal symmetric air entry Abdomen - soft, non-tender; bowel sounds normal; no masses,  no organomegaly Extremities - no joint deformities, effusion, or inflammation and no edema  Neurologic Examination: Mental Status: Alert, oriented, anxious and tearful.  Speech low in volume, but fluent and without evidence of aphasia. Able to follow commands, but effort with use of right extremities was poor at times. Cranial Nerves: II-Visual fields were normal. III/IV/VI-Pupils were equal and reacted normally to light. Extraocular movements were full and conjugate.    V/VII-no facial numbness and no facial weakness. VIII-normal. Anselmo Pickler was of low volume and was not dysarthric. XI: trapezius strength/neck flexion strength normal bilaterally XII-midline tongue extension with normal strength. Motor: Poor effort with movement of right upper and lower extremities. Strength appeared to be good when patient was distracted. Motor exam is otherwise unremarkable. Sensory: Normal throughout. Deep Tendon Reflexes: 2+ and symmetric. Plantars: Flexor bilaterally Carotid auscultation: Normal  Results for orders placed or performed during the hospital encounter of 12/13/15 (from the past 48 hour(s))  CBC     Status: None    Collection Time: 12/13/15  7:30 PM  Result Value Ref Range   WBC 6.0 4.0 - 10.5 K/uL   RBC 4.51 3.87 - 5.11 MIL/uL   Hemoglobin 13.4 12.0 - 15.0 g/dL   HCT 40.7 36.0 - 46.0 %   MCV 90.2 78.0 - 100.0 fL   MCH 29.7 26.0 - 34.0 pg   MCHC 32.9 30.0 - 36.0 g/dL   RDW 12.9 11.5 - 15.5 %   Platelets 269 150 - 400 K/uL  Differential     Status: None   Collection Time: 12/13/15  7:30 PM  Result Value Ref Range   Neutrophils Relative % 52 %   Neutro Abs 3.2 1.7 - 7.7 K/uL   Lymphocytes Relative 37 %   Lymphs Abs 2.2 0.7 - 4.0 K/uL   Monocytes Relative 9 %   Monocytes Absolute 0.5 0.1 - 1.0 K/uL   Eosinophils Relative 1 %   Eosinophils Absolute 0.1 0.0 - 0.7 K/uL   Basophils Relative 1 %   Basophils Absolute 0.0 0.0 - 0.1 K/uL   Ct Head Wo Contrast  12/13/2015  CLINICAL DATA:  Code stroke.  Right-sided weakness and aphasia. EXAM: CT HEAD WITHOUT CONTRAST TECHNIQUE: Contiguous axial images were obtained from the base of the skull through the vertex without intravenous contrast. COMPARISON:  Head CT - 11/27/2013; brain MRI -12/24/2014 FINDINGS: Gray-white differentiation is maintained. No CT evidence of acute large territory infarct. No intraparenchymal or extra-axial mass or hemorrhage. Unchanged size and configuration of the ventricles and basilar cisterns. No midline shift. Limited visualization of the paranasal sinuses and mastoid  air cells is normal. No air-fluid levels. Regional soft tissues appear normal. No displaced calvarial fracture. IMPRESSION: Negative noncontrast head CT. Critical Value/emergent results were called by telephone at the time of interpretation on 12/13/2015 at 7:20 pm to Dr. Nicole Kindred, who verbally acknowledged these results. Electronically Signed   By: Sandi Mariscal M.D.   On: 12/13/2015 19:23    Assessment: 55 y.o. female with risk factors for stroke presenting with possible TIA or small subcortical left MCA vessel stroke.  Stroke Risk Factors - family history and  hyperlipidemia  Plan: 1. HgbA1c, fasting lipid panel 2. MRI, MRA  of the brain without contrast 3. PT consult, OT consult, Speech consult 4. Echocardiogram 5. Carotid dopplers 6. Prophylactic therapy-Antiplatelet med: Aspirin 7. Risk factor modification 8. Telemetry monitoring  C.R. Nicole Kindred, MD [facility] [phone]  12/13/2015, 7:43 PM

## 2015-12-13 NOTE — H&P (Addendum)
History and Physical    Lindsey Bautista [id number] DOB: 1960/12/08 DOA: 12/13/2015  Referring MD/NP/PA:  PCP: Golden Pop, MD   Outpatient Specialists: Card, Dr. Rockey Situ  Patient coming from:  Home    Chief Complaint: Slurred speech, worsening left-sided weakness and CP  HPI: Lindsey Bautista is a 55 y.o. female with medical history significant of COPD, stroke with mild right-sided weakness, tobacco abuse, remote seizure, IBS, PVD, depression, type, GERD, who presents with slurred speech, worsening left-sided weakness and CP.  Patient reported she had history of stroke with mild right-sided weakness. She developed slurred speech at about 5:30 PM. Her left-sided weakness has also worsened. Patient has headache,and stiff neck. No fever, chills. No vision change or hearing loss. Patient also reports having chest pain. It is located in the substernal area, constant, 8 out of 10 in severity, nonradiating. Patient does not have cough, shortness of breath. No tenderness over calf areas. She has nausea, but no vomiting, diarrhea, abdominal pain, symptoms of UTI.  ED Course: pt was found to have negative troponin, INR 1.0, PTT 29, negative urinalysis, negative CT head for acute abnormalities. Patient is admitted to inpatient for further management and treatment. Neurology was consulted.  Can patient participate in ADLs? Little  Review of Systems:   General: no fevers, chills, no changes in body weight, has fatigue HEENT: no blurry vision, hearing changes or sore throat Pulm: no dyspnea, coughing, wheezing CV: has chest pain, no palpitations Abd: no nausea, vomiting, abdominal pain, diarrhea, constipation GU: no dysuria, burning on urination, increased urinary frequency, hematuria  Ext: no leg edema Neuro: has slurred speech, and worsening right-sided weakness. No vision change or hearing loss Skin: no rash MSK: No muscle spasm, no deformity, no limitation of range of movement in spin Heme:  No easy bruising.  Travel history: No recent long distant travel.  Allergy:  Allergies  Allergen Reactions  . Penicillin G Anaphylaxis and Other (See Comments)    Has patient had a PCN reaction causing immediate rash, facial/tongue/throat swelling, SOB or lightheadedness with hypotension: [id number] Has patient had a PCN reaction causing severe rash involving mucus membranes or skin necrosis: no:[id number]} Has patient had a PCN reaction that required hospitalization no:[id number]} Has patient had a PCN reaction occurring within the last 10 years: [id number] If all of the above answers are "NO", then may proceed with Cephalosporin use.   . Iodinated Diagnostic Agents Rash and Nausea And Vomiting    Past Medical History  Diagnosis Date  . Depression   . Anxiety   . Hypotension   . Right arm weakness   . Stroke ([place])   . COPD (chronic obstructive pulmonary disease) ([place])   . Situational syncope   . Seizures ([place])   . IBS (irritable bowel syndrome)   . GERD (gastroesophageal reflux disease)     Past Surgical History  Procedure Laterality Date  . Vaginal hysterectomy    . Tonsillectomy    . Colonoscopy with propofol N/A 11/22/2015    Procedure: COLONOSCOPY WITH PROPOFOL;  Surgeon: Manya Silvas, MD;  Location[facility] ENDOSCOPY;  Service: Endoscopy;  Laterality: N/A;  . Esophagogastroduodenoscopy (egd) with propofol N/A 11/22/2015    Procedure: ESOPHAGOGASTRODUODENOSCOPY (EGD) WITH PROPOFOL;  Surgeon: Manya Silvas, MD;  Location: [facility] ENDOSCOPY;  Service: Endoscopy;  Laterality: N/A;    Social History:  reports that she has been smoking Cigarettes.  She has a 25 pack-year smoking history. She has never used smokeless tobacco. She reports that she does not  drink alcohol or use illicit drugs.  Family History:  Family History  Problem Relation Age of Onset  . Heart disease    . COPD    . Stroke       Prior to Admission medications   Medication Sig Start Date End Date  Taking? Authorizing Provider  aspirin 81 MG tablet Take 81 mg by mouth daily.   Yes Historical Provider, MD  clonazePAM (KLONOPIN) 1 MG tablet Take 1 tablet (1 mg total) by mouth 2 (two) times daily as needed for anxiety. No alcohol with this Patient taking differently: Take 1 mg by mouth 2 (two) times daily. No alcohol with this 09/30/15  Yes [name], MD  cyclobenzaprine (FLEXERIL) 5 MG tablet Take 5 mg by mouth 3 (three) times daily as needed for muscle spasms.   Yes Historical Provider, MD  estradiol (ESTRACE) 1 MG tablet Take 1 mg by mouth daily.   Yes Historical Provider, MD  FLUoxetine (PROZAC) 20 MG capsule Take 20 mg by mouth daily.   Yes Historical Provider, MD  omeprazole (PRILOSEC) 20 MG capsule Take 20 mg by mouth daily.   Yes Historical Provider, MD  vitamin B-12 (CYANOCOBALAMIN) 1000 MCG tablet Take 1,000 mcg by mouth daily.   Yes Historical Provider, MD    Physical Exam: Filed Vitals:   12/14/15 0330 12/14/15 0356 12/14/15 0400 12/14/15 0500  BP: 99/48 92/46 96/43  83/42  Pulse: 67 67 64 58  Temp:      TempSrc:      Resp: 14 13 16 17   Height:      Weight:      SpO2: 96% 99% 95% 96%   General: Not in acute distress HEENT:       Eyes: PERRL, EOMI, no scleral icterus.       ENT: No discharge from the ears and nose, no pharynx injection, no tonsillar enlargement.        Neck: No JVD, no bruit, no mass felt. Heme: No neck lymph node enlargement. Cardiac: S1/S2, RRR, No murmurs, No gallops or rubs. Pulm: No rales, wheezing, rhonchi or rubs. Abd: Soft, nondistended, nontender, no rebound pain, no organomegaly, BS present. GU: No hematuria Ext: No pitting leg edema bilaterally. 2+DP/PT pulse bilaterally. Musculoskeletal: No joint deformities, No joint redness or warmth, no limitation of ROM in spin. Skin: No rashes.  Neuro: Alert, oriented X3, cranial nerves II-XII grossly intact, moves all extremities normally. Muscle strength1/5 in right arm and 0/5 in right  Leg (pt  has poor effort). Sensation to light touch intact.  Knee reflex 1+ bilaterally. Negative Babinski's sign.  Psych: Patient is not psychotic, no suicidal or hemocidal ideation.  Labs on Admission: I have personally reviewed following labs and imaging studies  CBC:  Recent Labs Lab 12/13/15 1930 12/13/15 1936  WBC 6.0  --   NEUTROABS 3.2  --   HGB 13.4 14.3  HCT 40.7 42.0  MCV 90.2  --   PLT 269  --    Basic Metabolic Panel:  Recent Labs Lab 12/13/15 1930 12/13/15 1936  NA 141 141  K 4.0 3.9  CL 104 101  CO2 26  --   GLUCOSE 86 81  BUN <5* 6  CREATININE 0.82 0.70  CALCIUM 8.7*  --    GFR: Estimated Creatinine Clearance: 70.4 mL/min (by C-G formula based on Cr of 0.7). Liver Function Tests:  Recent Labs Lab 12/13/15 1930  AST 16  ALT 10*  ALKPHOS 59  BILITOT 0.5  PROT 6.3*  ALBUMIN 3.6   No results for input(s): LIPASE, AMYLASE in the last 168 hours. No results for input(s): AMMONIA in the last 168 hours. Coagulation Profile:  Recent Labs Lab 12/13/15 1930  INR 1.02   Cardiac Enzymes:  Recent Labs Lab 12/14/15 0432  TROPONINI <0.03   BNP (last 3 results) No results for input(s): PROBNP in the last 8760 hours. HbA1C: No results for input(s): HGBA1C in the last 72 hours. CBG: No results for input(s): GLUCAP in the last 168 hours. Lipid Profile:  Recent Labs  12/14/15 0432  CHOL 263*  HDL 50  LDLCALC 193*  TRIG 99  CHOLHDL 5.3   Thyroid Function Tests: No results for input(s): TSH, T4TOTAL, FREET4, T3FREE, THYROIDAB in the last 72 hours. Anemia Panel: No results for input(s): VITAMINB12, FOLATE, FERRITIN, TIBC, IRON, RETICCTPCT in the last 72 hours. Urine analysis:    Component Value Date/Time   COLORURINE YELLOW 12/13/2015 2116   COLORURINE Straw 04/27/2014 1404   APPEARANCEUR CLEAR 12/13/2015 2116   APPEARANCEUR Clear 08/02/2015 1354   APPEARANCEUR Clear 04/27/2014 1404   LABSPEC 1.010 12/13/2015 2116   LABSPEC 1.005 04/27/2014  1404   PHURINE 7.0 12/13/2015 2116   PHURINE 8.0 04/27/2014 1404   GLUCOSEU NEGATIVE 12/13/2015 2116   GLUCOSEU Negative 04/27/2014 1404   HGBUR NEGATIVE 12/13/2015 2116   HGBUR Negative 04/27/2014 [place] NEGATIVE 12/13/2015 2116   BILIRUBINUR Negative 08/02/2015 1354   BILIRUBINUR Negative 04/27/2014 1404   KETONESUR NEGATIVE 12/13/2015 2116   KETONESUR Negative 04/27/2014 [place] NEGATIVE 12/13/2015 2116   PROTEINUR Negative 08/02/2015 1354   PROTEINUR Negative 04/27/2014 1404   NITRITE NEGATIVE 12/13/2015 2116   NITRITE Negative 08/02/2015 1354   NITRITE Negative 04/27/2014 1404   LEUKOCYTESUR NEGATIVE 12/13/2015 2116   LEUKOCYTESUR Negative 08/02/2015 1354   LEUKOCYTESUR Negative 04/27/2014 1404   Sepsis Labs: @LABRCNTIP (procalcitonin:4,lacticidven:4) )No results found for this or any previous visit (from the past 240 hour(s)).   Radiological Exams on Admission: Ct Head Wo Contrast  12/13/2015  CLINICAL DATA:  Code stroke.  Right-sided weakness and aphasia. EXAM: CT HEAD WITHOUT CONTRAST TECHNIQUE: Contiguous axial images were obtained from the base of the skull through the vertex without intravenous contrast. COMPARISON:  Head CT - 11/27/2013; brain MRI -12/24/2014 FINDINGS: Gray-white differentiation is maintained. No CT evidence of acute large territory infarct. No intraparenchymal or extra-axial mass or hemorrhage. Unchanged size and configuration of the ventricles and basilar cisterns. No midline shift. Limited visualization of the paranasal sinuses and mastoid air cells is normal. No air-fluid levels. Regional soft tissues appear normal. No displaced calvarial fracture. IMPRESSION: Negative noncontrast head CT. Critical Value/emergent results were called by telephone at the time of interpretation on 12/13/2015 at 7:20 pm to Dr. Nicole Kindred, who verbally acknowledged these results. Electronically Signed   By: Sandi Mariscal M.D.   On: 12/13/2015 19:23   Mr Brain Wo  Contrast  12/14/2015  CLINICAL DATA:  Initial evaluation for new onset weakness involving the right upper and lower extremities. EXAM: MRI HEAD WITHOUT CONTRAST MRA HEAD WITHOUT CONTRAST TECHNIQUE: Multiplanar, multiecho pulse sequences of the brain and surrounding structures were obtained without intravenous contrast. Angiographic images of the head were obtained using MRA technique without contrast. COMPARISON:  Prior CT from earlier the same day. FINDINGS: MRI HEAD FINDINGS Cerebral volume normal for patient age. Minimal patchy T2/FLAIR hyperintensity within the periventricular white matter, likely related to chronic small vessel ischemic disease, felt to be within normal limits for patient age.  No abnormal foci of restricted diffusion to suggest acute infarct. Major intracranial vascular flow voids are maintained. Left vertebral arteries diminutive. No acute or chronic intracranial hemorrhage. No areas of chronic infarction. No mass lesion, midline shift, or mass effect. No hydrocephalus. No extra-axial fluid collection. Major dural sinuses are grossly patent. Craniocervical junction within normal limits. Visualized upper cervical spine unremarkable. Pituitary gland within normal limits. No acute abnormality about the orbits. Paranasal sinuses are clear. No mastoid effusion. Inner ear structures grossly normal. Bone marrow signal intensity within normal limits. No scalp soft tissue abnormality. MRA HEAD FINDINGS ANTERIOR CIRCULATION: Visualized distal cervical segments of the internal carotid arteries are patent with antegrade flow. Petrous, cavernous, and supraclinoid segments patent without flow-limiting stenosis. A1 segments, anterior communicating artery, and anterior cerebral arteries well opacified. M1 segments patent without stenosis or occlusion. MCA bifurcations normal. Distal MCA branches well opacified and symmetric. POSTERIOR CIRCULATION: Dominant right vertebral artery patent to the vertebrobasilar  junction. The left vertebral arteries suspected to be hypoplastic, and is not well visualized on this exam. Basilar artery patent to its distal aspect. Superior cerebellar arteries patent bilaterally. Right PCA arises from the basilar artery and is well opacified to its distal aspect. Fetal type left PCA, also patent to its distal aspect. No aneurysm or vascular malformation. IMPRESSION: MRI HEAD IMPRESSION: Normal brain MRI for patient age. No acute intracranial process identified. MRA HEAD IMPRESSION: Normal intracranial MRA. Electronically Signed   By: Jeannine Boga M.D.   On: 12/14/2015 02:41   Mr Jodene Nam Head/brain Wo Cm  12/14/2015  CLINICAL DATA:  Initial evaluation for new onset weakness involving the right upper and lower extremities. EXAM: MRI HEAD WITHOUT CONTRAST MRA HEAD WITHOUT CONTRAST TECHNIQUE: Multiplanar, multiecho pulse sequences of the brain and surrounding structures were obtained without intravenous contrast. Angiographic images of the head were obtained using MRA technique without contrast. COMPARISON:  Prior CT from earlier the same day. FINDINGS: MRI HEAD FINDINGS Cerebral volume normal for patient age. Minimal patchy T2/FLAIR hyperintensity within the periventricular white matter, likely related to chronic small vessel ischemic disease, felt to be within normal limits for patient age. No abnormal foci of restricted diffusion to suggest acute infarct. Major intracranial vascular flow voids are maintained. Left vertebral arteries diminutive. No acute or chronic intracranial hemorrhage. No areas of chronic infarction. No mass lesion, midline shift, or mass effect. No hydrocephalus. No extra-axial fluid collection. Major dural sinuses are grossly patent. Craniocervical junction within normal limits. Visualized upper cervical spine unremarkable. Pituitary gland within normal limits. No acute abnormality about the orbits. Paranasal sinuses are clear. No mastoid effusion. Inner ear  structures grossly normal. Bone marrow signal intensity within normal limits. No scalp soft tissue abnormality. MRA HEAD FINDINGS ANTERIOR CIRCULATION: Visualized distal cervical segments of the internal carotid arteries are patent with antegrade flow. Petrous, cavernous, and supraclinoid segments patent without flow-limiting stenosis. A1 segments, anterior communicating artery, and anterior cerebral arteries well opacified. M1 segments patent without stenosis or occlusion. MCA bifurcations normal. Distal MCA branches well opacified and symmetric. POSTERIOR CIRCULATION: Dominant right vertebral artery patent to the vertebrobasilar junction. The left vertebral arteries suspected to be hypoplastic, and is not well visualized on this exam. Basilar artery patent to its distal aspect. Superior cerebellar arteries patent bilaterally. Right PCA arises from the basilar artery and is well opacified to its distal aspect. Fetal type left PCA, also patent to its distal aspect. No aneurysm or vascular malformation. IMPRESSION: MRI HEAD IMPRESSION: Normal brain MRI for patient age. No  acute intracranial process identified. MRA HEAD IMPRESSION: Normal intracranial MRA. Electronically Signed   By: Jeannine Boga M.D.   On: 12/14/2015 02:41     EKG: Independently reviewed. QTC 505, low-voltage  Assessment/Plan Principal Problem:   Stroke (cerebrum) (HCC) Active Problems:   Smoker   Acute anxiety   Depression   PAD (peripheral artery disease) (HCC)   Hyperlipidemia   COPD (chronic obstructive pulmonary disease) (HCC)   GERD (gastroesophageal reflux disease)   CVA (cerebral infarction)   Chest pain  Addendum: pt had hypotension in morning while she was sleeping. No CP or SOB. Mental status normal. No signs of infection. unclear etiology. Bp 88/51 now.  -IVF: 2.5 L of NS bolus, then 75 cc/h -monitoring Bp closely,   roke vs. TIA: has hx of stroke. Patient's symptoms are concerning for new stroke vs. TIA  today. CT head is negative for new acute intracranial abnormalities. Neurology was consulted. Dr. Nicole Kindred saw patient, recommended stroke workup.  -will admit to SDU (due to persistent CP) for observation -Appreciate Dr. Nicole Kindred consultation, follow-up recommendations as follows:   1. HgbA1c, fasting lipid panel  2. MRI, MRA of the brain without contrast  3. PT consult, OT consult, Speech consult  4. Echocardiogram  5. Carotid dopplers  6. Prophylactic therapy-Antiplatelet med: Aspirin  7. Risk factor modification  8. Telemetry monitoring -will start her with lipitor -check UDS -Hold Estradiol  Chest pain: No respiratory symptoms. No sense of DVT or shortness of breath, less likely to have a PE. Likely due to demanding ischemia secondary to possible stroke. - cycle CE q6 x3 - Nitroglycerin, Morphine, and aspirin, lipitor  - Risk factor stratification: will check FLP, UDS and A1C  - 2d echo  Depression and anxiety:  -Continue home medications: Klonoopin, prozac  GERD: -Protonix  Tobacco abuse: -Did counseling about importance of quitting smoking -Nicotine patch  COPD (chronic obstructive pulmonary disease) ([place]): stable. -prn albuterol nebs   DVT ppx: SQ Heparin (if pt develops severe chest pain or significantly elevated trop, will be easier to switch to IV heparin or stop heparin for procedure than using Lovenox).   Code Status: Full code Family Communication: Yes, patient's 2 daughters at bed side Disposition Plan:  Anticipate discharge back to previous home environment Consults called: Neuro, Dr. Nicole Kindred Admission status:  (obs/SDU)   Date of Service 12/14/2015    Ivor Costa [facility] Pager [phone]  If 7PM-7AM, please contact night-coverage [email or web address] Password [facility] 12/14/2015, 5:14 AM

## 2015-12-13 NOTE — ED Notes (Signed)
Dr. Darl Householder called to bedside for possible code stroke assessment.

## 2015-12-13 NOTE — Code Documentation (Signed)
Patient is a 55 year old WF transferred from [facility] for stroke like symptoms, vital signs 161/78, and CBG 70. Patient presents to [facility] via [facility] with right sided upper and lower extremity weakness. NIH score of 7, for minor facial palsy, motor right arm, and motor right leg. Patient positive history for TIA two years ago and currently on daily ASA. Patient to be TIA alert and admitted to Hospitalist team.  Will continue to monitor the patient closely.

## 2015-12-13 NOTE — ED Notes (Signed)
Pt to MRI, admin Ativan. Pt very anxious and tearful. Husband at bedside. Reassured pt

## 2015-12-13 NOTE — ED Notes (Signed)
EDP at bedside, Code Stroke was called. Pt to CT with this RN

## 2015-12-13 NOTE — ED Notes (Signed)
Admitting MD at bedside, pt will be moved to stepdown because pt reports having "CP"

## 2015-12-13 NOTE — ED Provider Notes (Signed)
CSN: [id number]     Arrival date & time 12/13/15  1906 History   First MD Initiated Contact with Patient 12/13/15 1909     Chief Complaint  Patient presents with  . Code Stroke     (Consider location/radiation/quality/duration/timing/severity/associated sxs/prior Treatment) HPI Patient is a 55 year old female with a history of TIAs who presents for evaluation of neurologic deficits. According to her family, the patient began having slurred speech with word finding difficulties associated with a right upper and lower extremity weakness at approximately 5:30 PM this evening. She does have a history of TIA, and EMS reports that these are the same symptoms as she had with her previous TIA.  Level V cavity, acuity of condition   Past Medical History  Diagnosis Date  . Depression   . Anxiety   . Hypotension   . Right arm weakness   . Stroke ([place])   . COPD (chronic obstructive pulmonary disease) ([place])   . Situational syncope   . Seizures ([place])   . IBS (irritable bowel syndrome)   . GERD (gastroesophageal reflux disease)    Past Surgical History  Procedure Laterality Date  . Vaginal hysterectomy    . Tonsillectomy    . Colonoscopy with propofol N/A 11/22/2015    Procedure: COLONOSCOPY WITH PROPOFOL;  Surgeon: Manya Silvas, MD;  Location: [facility] ENDOSCOPY;  Service: Endoscopy;  Laterality: N/A;  . Esophagogastroduodenoscopy (egd) with propofol N/A 11/22/2015    Procedure: ESOPHAGOGASTRODUODENOSCOPY (EGD) WITH PROPOFOL;  Surgeon: Manya Silvas, MD;  Location[facility] ENDOSCOPY;  Service: Endoscopy;  Laterality: N/A;   Family History  Problem Relation Age of Onset  . Heart disease    . COPD    . Stroke     Social History  Substance Use Topics  . Smoking status: Current Every Day Smoker -- 1.00 packs/day for 25 years    Types: Cigarettes  . Smokeless tobacco: Never Used  . Alcohol Use: No   OB History    No data available     Review of Systems  Unable to perform ROS: Acuity  of condition      Allergies  Penicillin g and Iodinated diagnostic agents  Home Medications   Prior to Admission medications   Medication Sig Start Date End Date Taking? Authorizing Provider  aspirin 81 MG tablet Take 81 mg by mouth daily.   Yes Historical Provider, MD  clonazePAM (KLONOPIN) 1 MG tablet Take 1 tablet (1 mg total) by mouth 2 (two) times daily as needed for anxiety. No alcohol with this Patient taking differently: Take 1 mg by mouth 2 (two) times daily. No alcohol with this 09/30/15  Yes [name], MD  cyclobenzaprine (FLEXERIL) 5 MG tablet Take 5 mg by mouth 3 (three) times daily as needed for muscle spasms.   Yes Historical Provider, MD  estradiol (ESTRACE) 1 MG tablet Take 1 mg by mouth daily.   Yes Historical Provider, MD  FLUoxetine (PROZAC) 20 MG capsule Take 20 mg by mouth daily.   Yes Historical Provider, MD  omeprazole (PRILOSEC) 20 MG capsule Take 20 mg by mouth daily.   Yes Historical Provider, MD  vitamin B-12 (CYANOCOBALAMIN) 1000 MCG tablet Take 1,000 mcg by mouth daily.   Yes Historical Provider, MD   BP 143/92 mmHg  Pulse 63  Temp(Src) 98.9 F (37.2 C) (Oral)  Resp 17  Ht 5\' 2"  (1.575 m)  Wt 63.504 kg  BMI 25.60 kg/m2  SpO2 99% Physical Exam  Constitutional: She appears well-developed and  well-nourished.  HENT:  Head: Normocephalic and atraumatic.  Right Ear: External ear normal.  Eyes: Conjunctivae are normal. Pupils are equal, round, and reactive to light.  Neck: Normal range of motion. Neck supple. No JVD present.  Cardiovascular: Normal rate and regular rhythm.   Pulmonary/Chest: Effort normal and breath sounds normal. No respiratory distress.  Abdominal: Soft. She exhibits no distension. There is no tenderness.  Neurological:  Word finding difficulty, slurred speech One out of 5 strength right upper and lower extremities, 5 out of 5 strength left upper and lower extremities. No facial droop  Nursing note and vitals reviewed.   ED  Course  Procedures (including critical care time) Labs Review Labs Reviewed  COMPREHENSIVE METABOLIC PANEL - Abnormal; Notable for the following:    BUN <5 (*)    Calcium 8.7 (*)    Total Protein 6.3 (*)    ALT 10 (*)    All other components within normal limits  I-STAT CHEM 8, ED - Abnormal; Notable for the following:    Calcium, Ion 1.06 (*)    All other components within normal limits  ETHANOL  PROTIME-INR  APTT  CBC  DIFFERENTIAL  URINE RAPID DRUG SCREEN, HOSP PERFORMED  URINALYSIS, ROUTINE W REFLEX MICROSCOPIC (NOT AT [facility])  HEMOGLOBIN A1C  LIPID PANEL  TROPONIN I  TROPONIN I  TROPONIN I  I-STAT TROPOININ, ED    Imaging Review Ct Head Wo Contrast  12/13/2015  CLINICAL DATA:  Code stroke.  Right-sided weakness and aphasia. EXAM: CT HEAD WITHOUT CONTRAST TECHNIQUE: Contiguous axial images were obtained from the base of the skull through the vertex without intravenous contrast. COMPARISON:  Head CT - 11/27/2013; brain MRI -12/24/2014 FINDINGS: Gray-white differentiation is maintained. No CT evidence of acute large territory infarct. No intraparenchymal or extra-axial mass or hemorrhage. Unchanged size and configuration of the ventricles and basilar cisterns. No midline shift. Limited visualization of the paranasal sinuses and mastoid air cells is normal. No air-fluid levels. Regional soft tissues appear normal. No displaced calvarial fracture. IMPRESSION: Negative noncontrast head CT. Critical Value/emergent results were called by telephone at the time of interpretation on 12/13/2015 at 7:20 pm to Dr. Nicole Kindred, who verbally acknowledged these results. Electronically Signed   By: Sandi Mariscal M.D.   On: 12/13/2015 19:23   I have personally reviewed and evaluated these images and lab results as part of my medical decision-making.   EKG Interpretation None      MDM   Final diagnoses:  Stroke (cerebrum) ([place])  Stroke (cerebrum) ([place])  Stroke (cerebrum) ([place])    Patient  presents, concern for acute stroke so code stroke was activated. CT initially viewed and reviewed by me, no evidence of intracranial hemorrhage. Patient has normal vital signs, laboratory studies obtained and do not show metabolic or infectious etiologies for her symptoms. Neurologist was paged and evaluated the patient in ED, requests admission.    Leata Mouse, MD 12/13/15 2303  Wandra Arthurs, MD 12/13/15 [phone]

## 2015-12-13 NOTE — ED Notes (Signed)
Dr. Blaine Hamper ordered for pt. To have bed on stepdown instead of telemetry.

## 2015-12-13 NOTE — ED Notes (Signed)
Per EMS, the patient is from home with complaints of stiff neck. Family also noticed changes in speech since 5:30pm tonight, with right sided weakness in arm and legs. Previous symptoms 2 years ago with dx of TIA. Takes aspirin 81mg  daily. bp with ems: 161/78, p 65, o, 98% on 3L, rr 16, cbg 70.  [name]

## 2015-12-14 ENCOUNTER — Encounter (HOSPITAL_COMMUNITY): Payer: Self-pay | Admitting: Nurse Practitioner

## 2015-12-14 ENCOUNTER — Observation Stay (HOSPITAL_BASED_OUTPATIENT_CLINIC_OR_DEPARTMENT_OTHER): Payer: Medicaid Other

## 2015-12-14 ENCOUNTER — Observation Stay (HOSPITAL_COMMUNITY): Payer: Medicaid Other

## 2015-12-14 DIAGNOSIS — F329 Major depressive disorder, single episode, unspecified: Secondary | ICD-10-CM | POA: Diagnosis not present

## 2015-12-14 DIAGNOSIS — R55 Syncope and collapse: Secondary | ICD-10-CM | POA: Diagnosis not present

## 2015-12-14 DIAGNOSIS — R4182 Altered mental status, unspecified: Secondary | ICD-10-CM

## 2015-12-14 DIAGNOSIS — F419 Anxiety disorder, unspecified: Secondary | ICD-10-CM

## 2015-12-14 DIAGNOSIS — Z720 Tobacco use: Secondary | ICD-10-CM | POA: Diagnosis not present

## 2015-12-14 DIAGNOSIS — I6789 Other cerebrovascular disease: Secondary | ICD-10-CM

## 2015-12-14 LAB — LIPID PANEL
Cholesterol: 263 mg/dL — ABNORMAL HIGH (ref 0–200)
HDL: 50 mg/dL (ref >40)
LDL CALC: 193 mg/dL — AB (ref 0–99)
TRIGLYCERIDES: 99 mg/dL (ref <150)
Total CHOL/HDL Ratio: 5.3 RATIO
VLDL: 20 mg/dL (ref 0–40)

## 2015-12-14 LAB — MRSA PCR SCREENING: MRSA BY PCR: NEGATIVE

## 2015-12-14 LAB — TROPONIN I
Troponin I: <0.03 ng/mL (ref <0.031)
Troponin I: <0.03 ng/mL (ref <0.031)
Troponin I: <0.03 ng/mL (ref <0.031)

## 2015-12-14 LAB — ECHOCARDIOGRAM COMPLETE
Height: 62 in
Weight: 2240 oz

## 2015-12-14 LAB — CORTISOL-AM, BLOOD: CORTISOL - AM: 5.9 ug/dL — AB (ref 6.7–22.6)

## 2015-12-14 LAB — GLUCOSE, CAPILLARY: Glucose-Capillary: 87 mg/dL (ref 65–99)

## 2015-12-14 MED ORDER — SODIUM CHLORIDE 0.9 % IV BOLUS (SEPSIS)
1000.0000 mL | Freq: Once | INTRAVENOUS | Status: AC
  Administered 2015-12-14: 1000 mL via INTRAVENOUS

## 2015-12-14 MED ORDER — ALBUTEROL SULFATE (2.5 MG/3ML) 0.083% IN NEBU: 2.5000 mg | INHALATION_SOLUTION | Freq: Four times a day (QID) | RESPIRATORY_TRACT | Status: DC | PRN

## 2015-12-14 MED ORDER — CLONAZEPAM 0.5 MG PO TABS
0.5000 mg | ORAL_TABLET | Freq: Two times a day (BID) | ORAL | Status: DC
  Administered 2015-12-14 – 2015-12-15 (×2): 0.5 mg via ORAL
  Filled 2015-12-14 (×2): qty 1

## 2015-12-14 MED ORDER — SODIUM CHLORIDE 0.9 % IV BOLUS (SEPSIS)
1500.0000 mL | Freq: Once | INTRAVENOUS | Status: AC
  Administered 2015-12-14: 1500 mL via INTRAVENOUS

## 2015-12-14 NOTE — Evaluation (Signed)
Physical Therapy Evaluation Patient Details Name: Lindsey Bautista MRN: [id number] DOB: [birth year] Today's Date: 12/14/2015   History of Present Illness  Pt adm with rt sided weakness. CT and MRI negative. Felt to be conversion disorder. PMH - COPD, ?CVA, PVD, depression  Clinical Impression  Pt admitted with above diagnosis and presents to PT with functional limitations due to deficits listed below (See PT problem list). Pt needs skilled PT to maximize independence and safety to allow discharge to home with husband. Will follow acutely. Expect deficits will resolve with time and encouragement.     Follow Up Recommendations Supervision for mobility/OOB;No PT follow up    Equipment Recommendations  Other (comment) (to be assessed)    Recommendations for Other Services       Precautions / Restrictions Precautions Precautions: Fall      Mobility  Bed Mobility Overal bed mobility: Needs Assistance Bed Mobility: Supine to Sit     Supine to sit: Min assist     General bed mobility comments: Assist to initiate movement with RLE  Transfers Overall transfer level: Needs assistance Equipment used: 1 person hand held assist Transfers: Sit to/from Stand Sit to Stand: Min assist         General transfer comment: Assist for balance  Ambulation/Gait Ambulation/Gait assistance: Min assist Ambulation Distance (Feet): 30 Feet Assistive device: 1 person hand held assist Gait Pattern/deviations: Decreased step length - right;Narrow base of support Gait velocity: decr Gait velocity interpretation: Below normal speed for age/gender General Gait Details: Assist for balance and support. Gait improved with distraction.  Stairs            Wheelchair Mobility    Modified Rankin (Stroke Patients Only)       Balance Overall balance assessment: Needs assistance Sitting-balance support: No upper extremity supported;Feet supported Sitting balance-Leahy Scale: Good      Standing balance support: Single extremity supported Standing balance-Leahy Scale: Poor Standing balance comment: min A for static standing                             Pertinent Vitals/Pain Pain Assessment: No/denies pain    Home Living Family/patient expects to be discharged to:: Private residence Living Arrangements: Spouse/significant other Available Help at Discharge: Family;Available PRN/intermittently Type of Home: House Home Access: Stairs to enter Entrance Stairs-Rails: None Entrance Stairs-Number of Steps: 3 Home Layout: One level Home Equipment: None      Prior Function Level of Independence: Independent               Hand Dominance   Dominant Hand: Right    Extremity/Trunk Assessment   Upper Extremity Assessment: Defer to OT evaluation           Lower Extremity Assessment: RLE deficits/detail RLE Deficits / Details: To testing <3/5 but with functional activities >3/5 and improved more with distraction.       Communication   Communication: No difficulties  Cognition Arousal/Alertness: Awake/alert Behavior During Therapy: WFL for tasks assessed/performed Overall Cognitive Status: Within Functional Limits for tasks assessed                      General Comments      Exercises        Assessment/Plan    PT Assessment Patient needs continued PT services  PT Diagnosis Difficulty walking;Abnormality of gait   PT Problem List Decreased strength;Decreased balance;Decreased mobility  PT Treatment Interventions DME instruction;Gait training;Functional mobility  training;Therapeutic activities;Therapeutic exercise;Balance training;Patient/family education   PT Goals (Current goals can be found in the Care Plan section) Acute Rehab PT Goals Patient Stated Goal: Return home PT Goal Formulation: With patient Time For Goal Achievement: 12/21/15 Potential to Achieve Goals: Good    Frequency Min 3X/week   Barriers to discharge         Co-evaluation               End of Session Equipment Utilized During Treatment: Gait belt Activity Tolerance: Patient tolerated treatment well Patient left: in chair;with call bell/phone within reach Nurse Communication: Mobility status    Functional Assessment Tool Used: clinical judgement Functional Limitation: Mobility: Walking and moving around Mobility: Walking and Moving Around Current Status [phone]): At least 20 percent but less than 40 percent impaired, limited or restricted Mobility: Walking and Moving Around Goal Status [phone]): 0 percent impaired, limited or restricted    Time: 1347-1404 PT Time Calculation (min) (ACUTE ONLY): 17 min   Charges:   PT Evaluation $PT Eval Moderate Complexity: 1 Procedure     PT G Codes:   PT G-Codes **NOT FOR INPATIENT CLASS** Functional Assessment Tool Used: clinical judgement Functional Limitation: Mobility: Walking and moving around Mobility: Walking and Moving Around Current Status [id number]): At least 20 percent but less than 40 percent impaired, limited or restricted Mobility: Walking and Moving Around Goal Status [phone]): 0 percent impaired, limited or restricted    [facility] 12/14/2015, 4:10 PM [facility] PT [phone]

## 2015-12-14 NOTE — Progress Notes (Signed)
*  PRELIMINARY RESULTS* Echocardiogram 2D Echocardiogram has been performed.  Leavy Cella 12/14/2015, 8:53 AM

## 2015-12-14 NOTE — Progress Notes (Signed)
EEG Completed; Results Pending  

## 2015-12-14 NOTE — ED Notes (Signed)
Niu MD at bedside. 

## 2015-12-14 NOTE — Progress Notes (Signed)
[facility] TEAM 1 - Stepdown/ICU TEAM Progress Note  Lindsey Bautista [id number] DOB: 07/05/1961 DOA: 12/13/2015 PCP: Golden Pop, MD  Admit HPI / Brief Narrative: 55 y.o. WF PMHx Depression, Anxiety, remote seizure, COPD, Stroke with mild right-sided weakness, tobacco abuse, , irritable bowel syndrome, PVD, GERD,   Who presents with slurred speech, worsening left-sided weakness and CP.  Patient reported she had history of stroke with mild right-sided weakness. She developed slurred speech at about 5:30 PM. Her left-sided weakness has also worsened. Patient has headache,and stiff neck. No fever, chills. No vision change or hearing loss. Patient also reports having chest pain. It is located in the substernal area, constant, 8 out of 10 in severity, nonradiating. Patient does not have cough, shortness of breath. No tenderness over calf areas. She has nausea, but no vomiting, diarrhea, abdominal pain, symptoms of UTI.  ED Course: pt was found to have negative troponin, INR 1.0, PTT 29, negative urinalysis, negative CT head for acute abnormalities. Patient is admitted to inpatient for further management and treatment. Neurology was consulted.   HPI/Subjective: 4/25 A/O 4. States has had multiple events over the years of syncope and collapse States husband informed her that she has had several episodes of syncope and collapse. States normally home able to ambulate without any assistance devices   Assessment/Plan: Stroke vsTIA: -Hx stroke, however MRI does not reveal any old strokes.   -CT/MRI head is negative for new acute/chronic intracranial abnormalities.  -Neurology was consulted. Dr. Nicole Kindred saw patient, recommended stroke workup. - Hemoglobin A1c pending -fasting lipid panel pending -MRI/MRA pending, -Echocardiogram pending - Fasting lipid panel pending -Echocardiogram pending - Carotid dopplers  Pending  -Lipitor 40 mg daily -UDS negative -Hold Estradiol   Syncope and  collapse -orthostatic vitals -Decrease clonazepam to 0.5 mg BID -A.m. Cortisol -Psychogenic vs Arrhythmia? Patient's neuro exam does not match any known pathology i.e. Negative CT/MRI head -will await neurology recommendation but may want to   Chest pain:  -symptoms not consistent with DVT/PE -Resolved . - troponin 3 negative  Depression and anxiety:  -Klonopin decrease to 0.5 mg BID,  -Prozac 20 mg daily  GERD: -Protonix  Tobacco abuse: -Did counseling about importance of quitting smoking -Nicotine patch  COPD (chronic obstructive pulmonary disease) ([place]): stable. -prn albuterol nebs    Code Status: FULL Family Communication: no family present at time of exam Disposition Plan: per neurology    Consultants: [facility] Neurology   Procedure/Significant Events:    Culture nA  Antibiotics: NA  DVT prophylaxis: subcutaneous heparin   Devices    LINES / TUBES:      Continuous Infusions: . sodium chloride 75 mL/hr at 12/14/15 1300    Objective: VITAL SIGNS: Temp: 98.5 F (36.9 C) (04/25 1127) Temp Source: Oral (04/25 1127) BP: 123/71 mmHg (04/25 1200) Pulse Rate: 73 (04/25 1200) SPO2; FIO2:   Intake/Output Summary (Last 24 hours) at 12/14/15 1413 Last data filed at 12/14/15 1300  Gross per 24 hour  Intake 1071.25 ml  Output      0 ml  Net 1071.25 ml     Exam: General: A/O 4, NAD,No acute respiratory distress Eyes: negative scleral hemorrhage, negative icterus ENT: Negative Runny nose, negative gingival bleeding, Neck:  Negative scars, masses, torticollis, lymphadenopathy, JVD Lungs: Clear to auscultation bilaterally without wheezes or crackles Cardiovascular: Regular rate and rhythm without murmur gallop or rub normal S1 and S2 Abdomen:negative abdominal pain, nondistended, positive soft, bowel sounds, no rebound, no ascites, no appreciable mass Extremities:  No significant cyanosis, clubbing, or edema bilateral lower  extremities Psychiatric:  Negative depression, negative anxiety, negative fatigue, negative mania  Neurologic:  Cranial nerves II through XII intact, tongue/uvula midline, all extremities muscle strength 5/5xcept for right hemiparesis upper/lowerextremity strength 4/5, sensation decreased on right upper/ight lower extremity. finger nose finger on the left with some past pointing, unable to perform with right hand. Quick finger touch difficult to perform on left,unable to perform at all on right side.negative dysarthria, negative expressive aphasia, negative receptive aphasia.   Data Reviewed: Basic Metabolic Panel:  Recent Labs Lab 12/13/15 1930 12/13/15 1936  NA 141 141  K 4.0 3.9  CL 104 101  CO2 26  --   GLUCOSE 86 81  BUN <5* 6  CREATININE 0.82 0.70  CALCIUM 8.7*  --    Liver Function Tests:  Recent Labs Lab 12/13/15 1930  AST 16  ALT 10*  ALKPHOS 59  BILITOT 0.5  PROT 6.3*  ALBUMIN 3.6   No results for input(s): LIPASE, AMYLASE in the last 168 hours. No results for input(s): AMMONIA in the last 168 hours. CBC:  Recent Labs Lab 12/13/15 1930 12/13/15 1936  WBC 6.0  --   NEUTROABS 3.2  --   HGB 13.4 14.3  HCT 40.7 42.0  MCV 90.2  --   PLT 269  --    Cardiac Enzymes:  Recent Labs Lab 12/14/15 0432 12/14/15 1139  TROPONINI <0.03 <0.03   BNP (last 3 results) No results for input(s): BNP in the last 8760 hours.  ProBNP (last 3 results) No results for input(s): PROBNP in the last 8760 hours.  CBG:  Recent Labs Lab 12/14/15 0746  GLUCAP 87    No results found for this or any previous visit (from the past 240 hour(s)).   Studies:  Recent x-ray studies have been reviewed in detail by the Attending Physician  Scheduled Meds:  Scheduled Meds: .  stroke: mapping our early stages of recovery book   Does not apply Once  . aspirin  300 mg Rectal Daily   Or  . aspirin  325 mg Oral Daily  . atorvastatin  40 mg Oral q1800  . clonazePAM  1 mg Oral  BID  . FLUoxetine  20 mg Oral Daily  . heparin  5,000 Units Subcutaneous Q8H  . nicotine  21 mg Transdermal Daily  . pantoprazole  40 mg Oral Daily  . vitamin B-12  1,000 mcg Oral Daily    Time spent on care of this patient: 40 mins   WOODS, Geraldo Docker , MD  [facility] Office  [phone] Pager [phone]  On-Call/Text Page:      [email or web address]      password [email or web address]  If 7PM-7AM, please contact night-coverage [email or web address] Password [email or web address] 12/14/2015, 2:13 PM     Care during the described time interval was provided by me .  I have reviewed this patient's available data, including medical history, events of note, physical examination, and all test results as part of my evaluation. I have personally reviewed and interpreted all radiology studies.   Dia Crawford, MD [phone] Pager
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
## 2015-12-14 NOTE — Progress Notes (Signed)
*  PRELIMINARY RESULTS* Vascular Ultrasound Carotid Duplex (Doppler) has been completed.   There is no obvious evidence of hemodynamically significant internal carotid artery stenosis bilaterally. Vertebral arteries are patent with antegrade flow.   12/14/2015 4:52 PM Maudry Mayhew, RVT, RDCS, RDMS

## 2015-12-14 NOTE — Progress Notes (Signed)
PT Cancellation Note  Patient Details Name: Lindsey Bautista MRN: [id number] DOB: [birth year]   Cancelled Treatment:    Reason Eval/Treat Not Completed: Patient at procedure or test/unavailable (EEG). Will follow up later.   Daleen Steinhaus 12/14/2015, 11:20 AM Suanne Marker PT [phone]

## 2015-12-14 NOTE — Procedures (Signed)
HPI:  55 y/o with hx of seizure and hx of syncope presents with weakness.  TECHNICAL SUMMARY:  A multichannel referential and bipolar montage EEG using the standard international 10-20 system was performed on the patient described as awake.  The dominant background activity consists of 9-10 hertz activity seen most prominantly over the posterior head region.  The backgound activity is nonreactive to eye opening and closing procedures.  An excessive amount of beta (fast) activity is noted throughout the recording.  ACTIVATION:  Stepwise photic stimulation and hyperventilation are not performed.  EPILEPTIFORM ACTIVITY:  There were no spikes, sharp waves or paroxysmal activity.  SLEEP:  No sleep  CARDIAC:  The EKG lead revealed a regular sinus rhythm.  IMPRESSION:  This EEG demonstrated no focal, hemispheric, or lateralizing features.  There was no epileptiform activity recorded.  There was an excessive amount of fast (beta) activity noted throughout the recording which is usually due to medication such as benzodiazepines.  Correlate clinically.

## 2015-12-14 NOTE — Evaluation (Signed)
Speech Language Pathology Evaluation Patient Details Name: Lindsey Bautista MRN: [id number] DOB: Feb 19, 1961 Today's Date: 12/14/2015 Time: [id number] SLP Time Calculation (min) (ACUTE ONLY): 22 min  Problem List:  Patient Active Problem List   Diagnosis Date Noted  . Stroke (cerebrum) ([place]) 12/13/2015  . COPD (chronic obstructive pulmonary disease) ([place]) 12/13/2015  . Chest pain 12/13/2015  . GERD (gastroesophageal reflux disease)   . Gastroesophageal reflux disease without esophagitis   . PAD (peripheral artery disease) ([place]) 10/22/2015  . Hyperlipidemia 10/22/2015  . Cervical paraspinal muscle spasm 09/30/2015  . Benzodiazepine dependence, continuous ([place]) 09/08/2015  . Acute anxiety 06/07/2015  . Depression 06/07/2015  . Situational syncope 06/07/2015  . Faintness 04/21/2015  . Smoker 04/21/2015  . Syncope 04/21/2015  . Altered mental status 12/17/2014  . Right arm weakness 12/17/2014  . Right leg paresthesias 12/17/2014  . Hemisensory loss 12/17/2014  . Hemiparesis ([place]) 12/17/2014   Past Medical History:  Past Medical History  Diagnosis Date  . Depression   . Anxiety   . Hypotension   . Right arm weakness   . COPD (chronic obstructive pulmonary disease) ([place])   . Situational syncope   . Seizures ([place])   . IBS (irritable bowel syndrome)   . GERD (gastroesophageal reflux disease)    Past Surgical History:  Past Surgical History  Procedure Laterality Date  . Vaginal hysterectomy    . Tonsillectomy    . Colonoscopy with propofol N/A 11/22/2015    Procedure: COLONOSCOPY WITH PROPOFOL;  Surgeon: Manya Silvas, MD;  Location: [facility] ENDOSCOPY;  Service: Endoscopy;  Laterality: N/A;  . Esophagogastroduodenoscopy (egd) with propofol N/A 11/22/2015    Procedure: ESOPHAGOGASTRODUODENOSCOPY (EGD) WITH PROPOFOL;  Surgeon: Manya Silvas, MD;  Location: [facility] ENDOSCOPY;  Service: Endoscopy;  Laterality: N/A;   HPI:  Pt adm with rt sided weakness. CT and MRI negative. Felt to be  conversion disorder. PMH - COPD, ?CVA, PVD, depression   Assessment / Plan / Recommendation Clinical Impression  Pt performed WFL on Mo[employer]-Blind (did not have her glasses present), although with mild difficulties during sentence repetition (due to interruption at this point in testing) and divergent naming (produced 5 words as well as one word that did not start with "F"). Pt's daughter believes that the pt needs slightly increased time for verbal expression, but otherwise appears to be at her baseline, and MRI is negative for acute infarct. Suspect that mild changes will improve with time, although should they persist, pt may seek OP SLP services.    SLP Assessment  Patient does not need any further Speech Lanaguage Pathology Services    Follow Up Recommendations  24 hour supervision/assistance    Frequency and Duration           SLP Evaluation Prior Functioning  Cognitive/Linguistic Baseline: Within functional limits Type of Home: House Available Help at Discharge: Family;Available PRN/intermittently   Cognition  Overall Cognitive Status: Within Functional Limits for tasks assessed    Comprehension  Auditory Comprehension Overall Auditory Comprehension: Appears within functional limits for tasks assessed    Expression Expression Primary Mode of Expression: Verbal Verbal Expression Overall Verbal Expression: Other (comment) (daughter reports words are "slower" to come out) Naming: Impairment Divergent: 75-100% accurate Non-Verbal Means of Communication: Not applicable Written Expression Dominant Hand: Right   Oral / Motor  Oral Motor/Sensory Function Overall Oral Motor/Sensory Function: Within functional limits Motor Speech Overall Motor Speech: Appears within functional limits for tasks assessed   GO  Functional Assessment Tool Used: skilled clinical judgment Functional Limitations: Spoken language expressive Spoken Language Expression Current Status [phone]):  At least 1 percent but less than 20 percent impaired, limited or restricted Spoken Language Expression Goal Status [phone]): At least 1 percent but less than 20 percent impaired, limited or restricted Spoken Language Expression Discharge Status [phone]): At least 1 percent but less than 20 percent impaired, limited or restricted          Germain Osgood, M.A. CCC-SLP [phone]  Germain Osgood 12/14/2015, 4:30 PM

## 2015-12-15 DIAGNOSIS — R29898 Other symptoms and signs involving the musculoskeletal system: Secondary | ICD-10-CM | POA: Diagnosis not present

## 2015-12-15 DIAGNOSIS — F419 Anxiety disorder, unspecified: Secondary | ICD-10-CM

## 2015-12-15 LAB — HEMOGLOBIN A1C
Hgb A1c MFr Bld: 5.4 % (ref 4.8–5.6)
MEAN PLASMA GLUCOSE: 108 mg/dL

## 2015-12-15 LAB — LIPID PANEL
CHOLESTEROL: 211 mg/dL — AB (ref 0–200)
HDL: 44 mg/dL (ref >40)
LDL CALC: 143 mg/dL — AB (ref 0–99)
TRIGLYCERIDES: 118 mg/dL (ref <150)
Total CHOL/HDL Ratio: 4.8 RATIO
VLDL: 24 mg/dL (ref 0–40)

## 2015-12-15 LAB — TROPONIN I
Troponin I: <0.03 ng/mL
Troponin I: <0.03 ng/mL (ref <0.031)

## 2015-12-15 NOTE — Progress Notes (Signed)
Discharge orders received.  Discharge instructions and follow-up appointments reviewed with the patient.  VSS upon discharge.  IV removed and education complete.  Transported out via wheelchair.   Antwonette Feliz M, RN 

## 2015-12-15 NOTE — Progress Notes (Signed)
Physical Therapy Treatment Patient Details Name: Lindsey Bautista MRN: [id number] DOB: Jan 15, 1961 Today's Date: 12/15/2015    History of Present Illness Pt adm with rt sided weakness. CT and MRI negative. Felt to be conversion disorder. PMH - COPD, ?CVA, PVD, depression    PT Comments    Patient is making progress toward goals with ability to correct gait mechanics with cues. Stair training complete with min A and no LOB. Current plan remains appropriate.   Follow Up Recommendations  Supervision for mobility/OOB;No PT follow up     Equipment Recommendations  Other (comment) (pt reported having RW at home)    Recommendations for Other Services       Precautions / Restrictions Precautions Precautions: Fall Restrictions Weight Bearing Restrictions: No    Mobility  Bed Mobility               General bed mobility comments: Pt received standing in room with OT; returned sitting EOB with husband present  Transfers Overall transfer level: Needs assistance Equipment used: Rolling walker (2 wheeled) Transfers: Sit to/from Stand Sit to Stand: Supervision         General transfer comment: cues for safe use of AD; no physical assist needed  Ambulation/Gait Ambulation/Gait assistance: Min guard Ambulation Distance (Feet): 100 Feet Assistive device: Rolling walker (2 wheeled) Gait Pattern/deviations: Step-through pattern;Decreased stride length;Trunk flexed Gait velocity: decr   General Gait Details: cues for position of RW, posture, and bilat heel strike; pt able to correct with min cues   Stairs Stairs: Yes Stairs assistance: Min assist Stair Management: No rails;Forwards (HHA) Number of Stairs: 3 General stair comments: cues for sequencing and step to pattern with min A for balance; no LOB  Wheelchair Mobility    Modified Rankin (Stroke Patients Only)       Balance Overall balance assessment: Needs assistance Sitting-balance support: Feet supported;No  upper extremity supported Sitting balance-Leahy Scale: Good     Standing balance support: No upper extremity supported;During functional activity Standing balance-Leahy Scale: Poor                      Cognition Arousal/Alertness: Awake/alert Behavior During Therapy: WFL for tasks assessed/performed Overall Cognitive Status: Within Functional Limits for tasks assessed                      Exercises      General Comments        Pertinent Vitals/Pain Pain Assessment: No/denies pain    Home Living Family/patient expects to be discharged to:: Private residence Living Arrangements: Spouse/significant other Available Help at Discharge: Family;Available PRN/intermittently (step mother lives next door can call PRN) Type of Home: House Home Access: Stairs to enter Entrance Stairs-Rails: None Home Layout: One level Home Equipment: Environmental consultant - 2 wheels;Bedside commode;Shower seat (can borrow from family member)      Prior Function Level of Independence: Independent          PT Goals (current goals can now be found in the care plan section) Acute Rehab PT Goals Patient Stated Goal: go home PT Goal Formulation: With patient Time For Goal Achievement: 12/21/15 Potential to Achieve Goals: Good Progress towards PT goals: Progressing toward goals    Frequency  Min 3X/week    PT Plan Current plan remains appropriate    Co-evaluation             End of Session Equipment Utilized During Treatment: Gait belt Activity Tolerance: Patient tolerated treatment well Patient  left: with call bell/phone within reach;in bed;with family/visitor present     Time: 1140-1156 PT Time Calculation (min) (ACUTE ONLY): 16 min  Charges:  $Gait Training: 8-22 mins                    G Codes:      Salina April, PTA Pager: [phone]   12/15/2015, 12:07 PM

## 2015-12-15 NOTE — Care Management Note (Signed)
Case Management Note  Patient Details  Name: GEORGEANA RINDAL MRN: [id number] Date of Birth: [birth year]  Subjective/Objective:                    Action/Plan: Patient discharging home with self care. No further needs per CM.   Expected Discharge Date:                  Expected Discharge Plan:  Home/Self Care  In-House Referral:     Discharge planning Services     Post Acute Care Choice:    Choice offered to:     DME Arranged:    DME Agency:     HH Arranged:    [place] Agency:     Status of Service:  Completed, signed off  Medicare Important Message Given:    Date Medicare IM Given:    Medicare IM give by:    Date Additional Medicare IM Given:    Additional Medicare Important Message give by:     If discussed at [place] of Stay Meetings, dates discussed:    Additional Comments:  Pollie Friar, RN 12/15/2015, 11:07 AM

## 2015-12-15 NOTE — Progress Notes (Signed)
Patient transferred from 52M at 2113, alert and oriented. No acute distress noted. VSS. Significant weakness noted in right lower extremity. Initial NIHSS 5. Patient oriented to unit, equipment and all safety measures.

## 2015-12-15 NOTE — Evaluation (Signed)
Occupational Therapy Evaluation Patient Details Name: Lindsey Bautista MRN: [id number] DOB: April 06, 1961 Today's Date: 12/15/2015    History of Present Illness Pt adm with rt sided weakness. CT and MRI negative. Felt to be conversion disorder. PMH - COPD, ?CVA, PVD, depression   Clinical Impression   Pt reports she was independent with ADLs and mobility PTA. Currently pt is overall min guard for safety with ADLs and functional mobility. Educated pt and husband on home safety and fall prevention strategies. Pt planning to d/c home with intermittent supervision from family. Pt would benefit from continued skilled OT to address established goals.    Follow Up Recommendations  No OT follow up;Supervision - Intermittent    Equipment Recommendations  None recommended by OT    Recommendations for Other Services       Precautions / Restrictions Precautions Precautions: Fall Restrictions Weight Bearing Restrictions: No      Mobility Bed Mobility               General bed mobility comments: Pt sitting EOB upon arrival.  Transfers Overall transfer level: Needs assistance Equipment used: Rolling walker (2 wheeled) Transfers: Sit to/from Stand Sit to Stand: Min guard         General transfer comment: Hands on min guard for safety; no physical assist required. Sit to stand from EOB x 2, toilet x 1. VCs for hand placement    Balance Overall balance assessment: Needs assistance Sitting-balance support: Feet supported;No upper extremity supported Sitting balance-Leahy Scale: Good     Standing balance support: No upper extremity supported;During functional activity Standing balance-Leahy Scale: Fair                              ADL Overall ADL's : Needs assistance/impaired Eating/Feeding: Set up;Sitting   Grooming: Min guard;Standing;Wash/dry hands   Upper Body Bathing: Min guard;Sitting   Lower Body Bathing: Min guard;Sit to/from stand   Upper Body  Dressing : Min guard;Sitting   Lower Body Dressing: Min guard;Sit to/from stand   Toilet Transfer: Min guard;Ambulation;Regular Toilet;RW   Toileting- Water quality scientist and Hygiene: Min guard;Sit to/from stand       Functional mobility during ADLs: Min guard;Rolling walker General ADL Comments: Educated pt and husband on supervision for safety with tub transfers, use of shower seat in the tub for safety; pt and husband verbalize understanding.     Vision     Perception     Praxis      Pertinent Vitals/Pain Pain Assessment: No/denies pain     Hand Dominance Right   Extremity/Trunk Assessment Upper Extremity Assessment Upper Extremity Assessment: RUE deficits/detail RUE Deficits / Details: Decreased shoulder AROM ~120 degrees, full PROM. Grossly 4/5 strength. RUE Sensation: decreased light touch RUE Coordination: decreased fine motor   Lower Extremity Assessment Lower Extremity Assessment: Defer to PT evaluation   Cervical / Trunk Assessment Cervical / Trunk Assessment: Normal   Communication Communication Communication: No difficulties   Cognition Arousal/Alertness: Awake/alert Behavior During Therapy: WFL for tasks assessed/performed Overall Cognitive Status: Within Functional Limits for tasks assessed                     General Comments       Exercises       Shoulder Instructions      Home Living Family/patient expects to be discharged to:: Private residence Living Arrangements: Spouse/significant other Available Help at Discharge: Family;Available PRN/intermittently (step mother lives next  door can call PRN) Type of Home: House Home Access: Stairs to enter [employer] of Steps: 3 Entrance Stairs-Rails: None Home Layout: One level     Bathroom Shower/Tub: Tub/shower unit Shower/tub characteristics: Curtain Biochemist, clinical: Standard     Home Equipment: Environmental consultant - 2 wheels;Bedside commode;Shower seat (can borrow from family  member)          Prior Functioning/Environment Level of Independence: Independent             OT Diagnosis: Generalized weakness   OT Problem List: Decreased strength;Decreased range of motion;Impaired balance (sitting and/or standing);Decreased safety awareness;Decreased knowledge of use of DME or AE;Decreased knowledge of precautions;Impaired UE functional use   OT Treatment/Interventions: Self-care/ADL training;Therapeutic exercise;Energy conservation;DME and/or AE instruction;Therapeutic activities;Patient/family education;Balance training    OT Goals(Current goals can be found in the care plan section) Acute Rehab OT Goals Patient Stated Goal: Return home OT Goal Formulation: With patient Time For Goal Achievement: 12/29/15 Potential to Achieve Goals: Good ADL Goals Pt Will Perform Grooming: with modified independence;standing Pt Will Perform Upper Body Bathing: with modified independence;sitting Pt Will Perform Lower Body Bathing: with modified independence;sit to/from stand Pt Will Transfer to Toilet: with modified independence;ambulating;regular height toilet Pt Will Perform Toileting - Clothing Manipulation and hygiene: with modified independence;sit to/from stand Pt Will Perform Tub/Shower Transfer: with supervision;Tub transfer;ambulating;shower seat Pt/caregiver will Perform Home Exercise Program: Increased ROM;Increased strength;Right Upper extremity;Independently  OT Frequency: Min 2X/week   Barriers to D/C:            Co-evaluation              End of Session Equipment Utilized During Treatment: Rolling walker  Activity Tolerance: Patient tolerated treatment well Patient left: Other (comment) (with PT)   Time: [id number] OT Time Calculation (min): 16 min Charges:  OT General Charges $OT Visit: 1 Procedure OT Evaluation $OT Eval Moderate Complexity: 1 Procedure G-Codes: OT G-codes **NOT FOR INPATIENT CLASS** Functional Assessment Tool Used:  Clinical judgement Functional Limitation: Self care Self Care Current Status [id number]): At least 1 percent but less than 20 percent impaired, limited or restricted Self Care Goal Status [id number]): At least 1 percent but less than 20 percent impaired, limited or restricted   Binnie Kand M.S., OTR/L Pager: [phone]  12/15/2015, 11:51 AM

## 2015-12-29 ENCOUNTER — Ambulatory Visit (INDEPENDENT_AMBULATORY_CARE_PROVIDER_SITE_OTHER): Payer: Medicaid Other | Admitting: Family Medicine

## 2015-12-29 ENCOUNTER — Encounter: Payer: Self-pay | Admitting: Family Medicine

## 2015-12-29 VITALS — BP 101/64 | HR 67 | Temp 98.6°F | Ht 61.0 in | Wt 138.0 lb

## 2015-12-29 DIAGNOSIS — E279 Disorder of adrenal gland, unspecified: Secondary | ICD-10-CM

## 2015-12-29 DIAGNOSIS — E278 Other specified disorders of adrenal gland: Secondary | ICD-10-CM | POA: Diagnosis not present

## 2015-12-29 DIAGNOSIS — R55 Syncope and collapse: Secondary | ICD-10-CM | POA: Diagnosis not present

## 2015-12-29 DIAGNOSIS — I700 Atherosclerosis of aorta: Secondary | ICD-10-CM | POA: Insufficient documentation

## 2015-12-29 MED ORDER — CLONAZEPAM 1 MG PO TABS: 1.0000 mg | ORAL_TABLET | Freq: Two times a day (BID) | ORAL | Status: DC | PRN

## 2015-12-29 NOTE — Assessment & Plan Note (Signed)
Reviewed hospital notes see copy for details felt to be TIA related

## 2015-12-29 NOTE — Assessment & Plan Note (Signed)
Treating cholesterol with Lipitor with goal LDL less than 70 see cardiology notes for details Patient to stop smoking patient thinking about it and trying

## 2015-12-29 NOTE — Assessment & Plan Note (Signed)
On review of endocrinology notes most likely benign cysts of no clinical consequence. Workup being done by endocrinology [facility] clinic.

## 2015-12-29 NOTE — Progress Notes (Signed)
BP 101/64 mmHg  Pulse 67  Temp(Src) 98.6 F (37 C)  Ht 5\' 1"  (1.549 m)  Wt 138 lb (62.596 kg)  BMI 26.09 kg/m2  SpO2 97%   Subjective:    Patient ID: Lindsey Bautista, female    DOB: 1961-08-04, 55 y.o.   MRN: [id number]  HPI: Lindsey Bautista is a 55 y.o. female  Chief Complaint  Patient presents with  . Loss of Consciousness  . Hospitalization Follow-up   Patient follow-up from hospitalization reviewed notes MRI and MRA were normal patient ultimately diagnosed as TIA. Patient did show some atherosclerosis on abdominal aorta and iliacs and recommendations from cardiologist to take cholesterol medications and keep cholesterol low. Patient's been taking cholesterol medicine without complaints has not been successful in stopping smoking is recommended. Patient did physical therapy and is been able to stop using her walker hasn't been falling as much and is been doing home PT. Working with endocrinology on little adrenal cyst with workup scheduled next month Nerves depression still very bad a lot of financial stress contributing. Patient with stress stomach got worse this last week but is settled down somewhat is helped by [place] reflux medication somewhat this was recommended by gastroenterology. Patient still not driving because of syncope risk.  Relevant past medical, surgical, family and social history reviewed and updated as indicated. Interim medical history since our last visit reviewed. Allergies and medications reviewed and updated.  Review of Systems  Constitutional: Positive for fatigue. Negative for fever and chills.  Respiratory: Negative.   Cardiovascular: Negative.     Per HPI unless specifically indicated above     Objective:    BP 101/64 mmHg  Pulse 67  Temp(Src) 98.6 F (37 C)  Ht 5\' 1"  (1.549 m)  Wt 138 lb (62.596 kg)  BMI 26.09 kg/m2  SpO2 97%  Wt Readings from Last 3 Encounters:  12/29/15 138 lb (62.596 kg)  12/14/15 145 lb 1.6 oz (65.817 kg)   11/22/15 136 lb (61.689 kg)    Physical Exam  Constitutional: She is oriented to person, place, and time. She appears well-developed and well-nourished. No distress.  HENT:  Head: Normocephalic and atraumatic.  Right Ear: Hearing normal.  Left Ear: Hearing normal.  Nose: Nose normal.  Eyes: Conjunctivae and lids are normal. Right eye exhibits no discharge. Left eye exhibits no discharge. No scleral icterus.  Cardiovascular: Normal rate and regular rhythm.   Pulmonary/Chest: Effort normal and breath sounds normal. No respiratory distress.  Musculoskeletal: Normal range of motion.  Neurological: She is alert and oriented to person, place, and time.  Skin: Skin is intact. No rash noted.  Psychiatric: She has a normal mood and affect. Her speech is normal and behavior is normal. Judgment and thought content normal. Cognition and memory are normal.        Assessment & Plan:   Problem List Items Addressed This Visit      Cardiovascular and Mediastinum   Syncope and collapse - Primary    Reviewed hospital notes see copy for details felt to be TIA related      Relevant Medications   atorvastatin (LIPITOR) 40 MG tablet   Abdominal aortic atherosclerosis (HCC)    Treating cholesterol with Lipitor with goal LDL less than 70 see cardiology notes for details Patient to stop smoking patient thinking about it and trying      Relevant Medications   atorvastatin (LIPITOR) 40 MG tablet     Endocrine   Adrenal gland cyst ([place])  On review of endocrinology notes most likely benign cysts of no clinical consequence. Workup being done by endocrinology [facility] clinic.          Follow up plan: Return in about 3 months (around 03/30/2016) for Med check.

## 2016-01-13 ENCOUNTER — Ambulatory Visit
Admission: RE | Admit: 2016-01-13 | Discharge: 2016-01-13 | Disposition: A | Discharge status: Home / Self Care | Payer: Medicaid Other | Source: Ambulatory Visit | Attending: Family Medicine | Admitting: Family Medicine

## 2016-01-13 DIAGNOSIS — Z1239 Encounter for other screening for malignant neoplasm of breast: Secondary | ICD-10-CM

## 2016-01-13 DIAGNOSIS — Z1231 Encounter for screening mammogram for malignant neoplasm of breast: Secondary | ICD-10-CM | POA: Diagnosis present
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
## 2016-01-24 ENCOUNTER — Telehealth: Payer: Self-pay | Admitting: Neurology

## 2016-01-24 NOTE — Telephone Encounter (Signed)
Records faxed to [facility] at [place] on 01/24/16.

## 2016-01-24 NOTE — Telephone Encounter (Signed)
Janelle with [facility] is calling to get medical records for deadline tomorrow.  Please call.

## 2016-01-25 ENCOUNTER — Telehealth: Payer: Self-pay | Admitting: Cardiovascular Disease

## 2016-01-25 NOTE — Telephone Encounter (Signed)
Received fax from law office for notes and  test results after 08/21/13 to present with signed ROI form.  Printed and faxed to [facility] at [phone].  01/25/16 1513 [facility]

## 2016-02-29 ENCOUNTER — Telehealth: Payer: Self-pay | Admitting: Family Medicine

## 2016-02-29 DIAGNOSIS — N2889 Other specified disorders of kidney and ureter: Secondary | ICD-10-CM

## 2016-02-29 DIAGNOSIS — D3501 Benign neoplasm of right adrenal gland: Secondary | ICD-10-CM

## 2016-02-29 NOTE — Telephone Encounter (Signed)
Pt called and stated that she has a tumor on her right kidney and she would like to get a referral to go see Dr. Loa Socks. Pt stated that she had saw him in the past.

## 2016-02-29 NOTE — Telephone Encounter (Signed)
I've put in the referral in. Can we please find out where she found this out and make sure we have the results for Dr. Bary Castilla

## 2016-02-29 NOTE — Telephone Encounter (Signed)
Not a renal mass, an adrenal adenoma. Referral to Dr. Bary Castilla made today at patient's request for 2nd opinion as she is not happy with endocrinology.

## 2016-03-02 ENCOUNTER — Telehealth: Payer: Self-pay | Admitting: General Surgery

## 2016-03-02 NOTE — Telephone Encounter (Signed)
During an emergency room visit in December 2016 a CT scan of the abdomen and pelvis was obtained. This identified a 1.7 cm adrenal adenoma on the right side.  The patient has been evaluated by the endocrinologist at [facility], Lindsey Bautista, M.D.  laboratory testing showed that the adrenal nodule was nonfunctioning, and a 1 year follow-up CT on sees December 2017)recommended.  The patient had some questions about the possibility of cancer. I explained to her at this small size the cancer risk is very small, and the greater risk is a functioning tumor which has been proven not to be the case.  It's very appropriate to obtain yearly CT scans to show the area remained stable in size, typically after 2 years if there is no change no further imaging is required.  I encouraged her follow up with the endocrinologist for the follow-up scan in December 2017. If enlargement is noted appropriate referral to urology would be encouraged.  At this time, an office visit here is not required.

## 2016-03-02 NOTE — Telephone Encounter (Signed)
FYI

## 2016-03-20 ENCOUNTER — Encounter: Payer: Self-pay | Admitting: Emergency Medicine

## 2016-03-20 ENCOUNTER — Emergency Department
Admission: EM | Admit: 2016-03-20 | Discharge: 2016-03-20 | Disposition: A | Discharge status: Home / Self Care | Payer: Medicaid Other | Attending: Emergency Medicine | Admitting: Emergency Medicine

## 2016-03-20 ENCOUNTER — Emergency Department: Payer: Medicaid Other

## 2016-03-20 DIAGNOSIS — R531 Weakness: Secondary | ICD-10-CM | POA: Diagnosis present

## 2016-03-20 DIAGNOSIS — F17210 Nicotine dependence, cigarettes, uncomplicated: Secondary | ICD-10-CM | POA: Insufficient documentation

## 2016-03-20 DIAGNOSIS — F444 Conversion disorder with motor symptom or deficit: Secondary | ICD-10-CM | POA: Diagnosis not present

## 2016-03-20 DIAGNOSIS — F449 Dissociative and conversion disorder, unspecified: Secondary | ICD-10-CM | POA: Diagnosis not present

## 2016-03-20 DIAGNOSIS — Z79899 Other long term (current) drug therapy: Secondary | ICD-10-CM | POA: Diagnosis not present

## 2016-03-20 DIAGNOSIS — Z7982 Long term (current) use of aspirin: Secondary | ICD-10-CM | POA: Diagnosis not present

## 2016-03-20 DIAGNOSIS — J449 Chronic obstructive pulmonary disease, unspecified: Secondary | ICD-10-CM | POA: Diagnosis not present

## 2016-03-20 LAB — CBC
HCT: 37.2 % (ref 35.0–47.0)
Hemoglobin: 13.3 g/dL (ref 12.0–16.0)
MCH: 30.8 pg (ref 26.0–34.0)
MCHC: 35.8 g/dL (ref 32.0–36.0)
MCV: 86.1 fL (ref 80.0–100.0)
PLATELETS: 258 10*3/uL (ref 150–440)
RBC: 4.32 MIL/uL (ref 3.80–5.20)
RDW: 13.7 % (ref 11.5–14.5)
WBC: 6.9 10*3/uL (ref 3.6–11.0)

## 2016-03-20 LAB — BASIC METABOLIC PANEL
Anion gap: 7 (ref 5–15)
BUN: 12 mg/dL (ref 6–20)
CALCIUM: 9 mg/dL (ref 8.9–10.3)
CHLORIDE: 105 mmol/L (ref 101–111)
CO2: 26 mmol/L (ref 22–32)
CREATININE: 0.82 mg/dL (ref 0.44–1.00)
GFR calc non Af Amer: >60 mL/min (ref >60)
Glucose, Bld: 87 mg/dL (ref 65–99)
Potassium: 4 mmol/L (ref 3.5–5.1)
SODIUM: 138 mmol/L (ref 135–145)

## 2016-03-20 LAB — TROPONIN I

## 2016-03-20 MED ORDER — LORAZEPAM 2 MG/ML IJ SOLN
1.0000 mg | Freq: Once | INTRAMUSCULAR | Status: AC
  Administered 2016-03-20: 1 mg via INTRAVENOUS
  Filled 2016-03-20: qty 1

## 2016-03-20 NOTE — ED Notes (Signed)
Pt assisted to restroom with wheelchair.

## 2016-03-20 NOTE — Care Management (Signed)
ED visit for conversion disorder.  Asked by ED MD to arrange home health PT.  Patient does not qualify for home health PT due to Medicaid coverage.  Patient states that she lives at home with her husband.  Husband provides transportation.  Has RW at home for transportation .  Obtains medications from [facility] on [address]. Patient denies any issues obtaining medications.  Referral made to [facility] for outpatient PT.  Referral faxed to [facility].  Hard copy of referral given to patient prior to discharge.  Information with directions and hours of operation also provided to patient.  I have asked the patient that she call and follow up with [facility] clinic to see when they are available to see her.

## 2016-03-20 NOTE — ED Provider Notes (Signed)
[facility] Emergency Department Provider Note    ____________________________________________   I have reviewed the triage vital signs and the nursing notes.   HISTORY  Chief Complaint Chest Pain   History limited by: Not Limited   HPI Lindsey Bautista is a 55 y.o. female who presents because of concern for right upper arm and lower leg weakness as well as difficulty with speech. She noticed it this morning when she woke up. It has progressively gotten worse throughout the morning. The patient had similar symptoms 2 weeks ago but milder and did not see anyone. The patient was hospitalized 3 months ago for the same symptoms and had a full TIA workup done. Per neurology note and DC summary it was thought her symptoms were likely due to conversion disorder.    Past Medical History:  Diagnosis Date  . Anxiety   . COPD (chronic obstructive pulmonary disease) ([place])   . Depression   . GERD (gastroesophageal reflux disease)   . Hypotension   . IBS (irritable bowel syndrome)   . Right arm weakness   . Seizures ([place])   . Situational syncope   . TIA (transient ischemic attack)     Patient Active Problem List   Diagnosis Date Noted  . Abdominal aortic atherosclerosis ([place]) 12/29/2015  . Adrenal gland cyst ([place]) 12/29/2015  . Anxiety state   . Syncope and collapse   . Stroke (cerebrum) ([place]) 12/13/2015  . COPD (chronic obstructive pulmonary disease) ([place]) 12/13/2015  . Chest pain 12/13/2015  . GERD (gastroesophageal reflux disease)   . Gastroesophageal reflux disease without esophagitis   . PAD (peripheral artery disease) ([place]) 10/22/2015  . Hyperlipidemia 10/22/2015  . Cervical paraspinal muscle spasm 09/30/2015  . Benzodiazepine dependence, continuous ([place]) 09/08/2015  . Acute anxiety 06/07/2015  . Depression 06/07/2015  . Situational syncope 06/07/2015  . Faintness 04/21/2015  . Smoker 04/21/2015  . Syncope 04/21/2015  . Altered mental status  12/17/2014  . Right arm weakness 12/17/2014  . Right leg paresthesias 12/17/2014  . Hemisensory loss 12/17/2014  . Hemiparesis ([place]) 12/17/2014    Past Surgical History:  Procedure Laterality Date  . COLONOSCOPY WITH PROPOFOL N/A 11/22/2015   Procedure: COLONOSCOPY WITH PROPOFOL;  Surgeon: Manya Silvas, MD;  Location: [facility] ENDOSCOPY;  Service: Endoscopy;  Laterality: N/A;  . ESOPHAGOGASTRODUODENOSCOPY (EGD) WITH PROPOFOL N/A 11/22/2015   Procedure: ESOPHAGOGASTRODUODENOSCOPY (EGD) WITH PROPOFOL;  Surgeon: Manya Silvas, MD;  Location: [facility] ENDOSCOPY;  Service: Endoscopy;  Laterality: N/A;  . TONSILLECTOMY    . VAGINAL HYSTERECTOMY      Prior to Admission medications   Medication Sig Start Date End Date Taking? Authorizing Provider  aspirin 81 MG tablet Take 81 mg by mouth daily.    Historical Provider, MD  atorvastatin (LIPITOR) 40 MG tablet Take 40 mg by mouth daily.    Historical Provider, MD  clonazePAM (KLONOPIN) 1 MG tablet Take 1 tablet (1 mg total) by mouth 2 (two) times daily as needed for anxiety. No alcohol with this 12/29/15   [name], MD  cyclobenzaprine (FLEXERIL) 5 MG tablet Take 5 mg by mouth 3 (three) times daily as needed for muscle spasms.    Historical Provider, MD  estradiol (ESTRACE) 1 MG tablet Take 1 mg by mouth daily.    Historical Provider, MD  FLUoxetine (PROZAC) 20 MG capsule Take 20 mg by mouth daily.    Historical Provider, MD  omeprazole (PRILOSEC) 20 MG capsule Take 20 mg by mouth daily.  Historical Provider, MD  vitamin B-12 (CYANOCOBALAMIN) 1000 MCG tablet Take 1,000 mcg by mouth daily.    Historical Provider, MD    Allergies Penicillin g and Iodinated diagnostic agents  Family History  Problem Relation Age of Onset  . Heart disease    . COPD    . Stroke    . Breast cancer Paternal Aunt 53    Social History Social History  Substance Use Topics  . Smoking status: Current Every Day Smoker    Packs/day: 1.00    Years: 25.00     Types: Cigarettes  . Smokeless tobacco: Never Used  . Alcohol use No    Review of Systems  Constitutional: Negative for fever. Cardiovascular: Negative for chest pain. Respiratory: Negative for shortness of breath. Gastrointestinal: Negative for abdominal pain, vomiting and diarrhea. Genitourinary: Negative for dysuria. Musculoskeletal: Negative for back pain. Skin: Negative for rash. Neurological: Positive for right sided weakness.    10-point ROS otherwise negative.  ____________________________________________   PHYSICAL EXAM:  VITAL SIGNS: ED Triage Vitals  Enc Vitals Group     BP 03/20/16 1212 (!) 142/86     Pulse Rate 03/20/16 1212 64     Resp 03/20/16 1212 14     Temp --      Temp src --      SpO2 03/20/16 1212 99 %     Weight 03/20/16 1212 135 lb (61.2 kg)     Height 03/20/16 1212 5\' 4"  (1.626 m)     Head Circumference --      Peak Flow --      Pain Score 03/20/16 1213 3   Constitutional: Alert and oriented. Tearful. Eyes: Conjunctivae are normal. PERRL. Normal extraocular movements. ENT   Head: Normocephalic and atraumatic.   Nose: No congestion/rhinnorhea.   Mouth/Throat: Mucous membranes are moist.   Neck: No stridor. Hematological/Lymphatic/Immunilogical: No cervical lymphadenopathy. Cardiovascular: Normal rate, regular rhythm.  No murmurs, rubs, or gallops. Respiratory: Normal respiratory effort without tachypnea nor retractions. Breath sounds are clear and equal bilaterally. No wheezes/rales/rhonchi. Gastrointestinal: Soft and nontender. No distention. There is no CVA tenderness. Genitourinary: Deferred Musculoskeletal: Normal range of motion in all extremities. No joint effusions.  No lower extremity tenderness nor edema. Neurologic:  Normal speech and language. Face symmetric. Right upper and lower extremity with some weakness however I do question effort. Skin:  Skin is warm, dry and intact. No rash noted. Psychiatric: Tearful,  crying, appears anxious.  ____________________________________________    LABS (pertinent positives/negatives)  Labs Reviewed  BASIC METABOLIC PANEL  CBC  TROPONIN I     ____________________________________________   EKG  I, Nance Pear, attending physician, personally viewed and interpreted this EKG  EKG Time: 1215 Rate: 62 Rhythm: normal sinus rhythm Axis: normal Intervals: qtc 436 QRS: narrow ST changes: no st elevation Impression: normal ekg   ____________________________________________    RADIOLOGY  CXR IMPRESSION: No active cardiopulmonary disease.   ____________________________________________   PROCEDURES  Procedures  ____________________________________________   INITIAL IMPRESSION / ASSESSMENT AND PLAN / ED COURSE  Pertinent labs & imaging results that were available during my care of the patient were reviewed by me and considered in my medical decision making (see chart for details).  The patient presented to the emergency department today because of concerns for right-sided weakness. On exam patient was slightly weak however do question effort on that side. No facial asymmetry or slurred speech. Per chart review she had similar symptoms 3 months ago. At that time she had MRI/MRA as  well as further TIA workup done. She was seen by a neurologist. Per discharge summary neurologist note conversion disorder was thought to be the cause of the patient's symptoms. Will plan on discussing with neurology on call.  Clinical Course   ----------------------------------------- 1:22 PM on 03/20/2016 -----------------------------------------  Discussed with Dr. Shon Hale of [facility] Neurology. Given extensive work up with previous admission did not think further work up required at this time given same symptoms. Did recommend PT and outpatient therapy. I discussed this with the patient. She was somewhat frustrated given that she had been told she has had  strokes in the past. I tried to reassure patient that her MRI/MRAs did not show any evidence of a stroke and that the neurologist who saw her thought conversion disorder likely. Discussed with patient that she should follow up with neurology and that she should try to have PT and outpatient therapy for CBT.  ____________________________________________   FINAL CLINICAL IMPRESSION(S) / ED DIAGNOSES  Final diagnoses:  Conversion disorder     Note: This dictation was prepared with Dragon dictation. Any transcriptional errors that result from this process are unintentional    Nance Pear, MD 03/20/16 1720

## 2016-03-20 NOTE — ED Triage Notes (Signed)
Difficult to obtain accurate hx of sx. Pt c/o central chest pressure that started today, but reports called EMS because she thinks she had a mini stroke. Unsure why she thinks this. Has had CVA prior this year with residual right weakness but report worse over last week.

## 2016-03-20 NOTE — Discharge Instructions (Signed)
Please seek medical attention for any high fevers, chest pain, shortness of breath, change in behavior, persistent vomiting, bloody stool or any other new or concerning symptoms.  

## 2016-03-30 ENCOUNTER — Ambulatory Visit (INDEPENDENT_AMBULATORY_CARE_PROVIDER_SITE_OTHER): Payer: Medicaid Other | Admitting: Family Medicine

## 2016-03-30 ENCOUNTER — Encounter: Payer: Self-pay | Admitting: Family Medicine

## 2016-03-30 VITALS — BP 134/76 | HR 65 | Temp 98.0°F | Ht 62.1 in | Wt 141.0 lb

## 2016-03-30 DIAGNOSIS — F32A Depression, unspecified: Secondary | ICD-10-CM

## 2016-03-30 DIAGNOSIS — I6300 Cerebral infarction due to thrombosis of unspecified precerebral artery: Secondary | ICD-10-CM | POA: Diagnosis not present

## 2016-03-30 DIAGNOSIS — F447 Conversion disorder with mixed symptom presentation: Secondary | ICD-10-CM | POA: Insufficient documentation

## 2016-03-30 DIAGNOSIS — F329 Major depressive disorder, single episode, unspecified: Secondary | ICD-10-CM

## 2016-03-30 DIAGNOSIS — Z0000 Encounter for general adult medical examination without abnormal findings: Secondary | ICD-10-CM | POA: Diagnosis not present

## 2016-03-30 MED ORDER — FLUOXETINE HCL 20 MG PO CAPS: 20.0000 mg | ORAL_CAPSULE | Freq: Every day | ORAL | 12 refills | Status: DC

## 2016-03-30 MED ORDER — CLONAZEPAM 1 MG PO TABS: 1.0000 mg | ORAL_TABLET | Freq: Two times a day (BID) | ORAL | 2 refills | Status: DC | PRN

## 2016-03-30 NOTE — Assessment & Plan Note (Signed)
The current medical regimen is effective;  continue present plan and medications.  

## 2016-03-30 NOTE — Progress Notes (Signed)
BP 134/76 (BP Location: Left Arm, Patient Position: Sitting, Cuff Size: Small)   Pulse 65   Temp 98 F (36.7 C)   Ht 5' 2.1" (1.577 m)   Wt 141 lb (64 kg)   SpO2 99%   BMI 25.71 kg/m    Subjective:    Patient ID: Lindsey Bautista, female    DOB: 04-27-1961, 55 y.o.   MRN: [id number]  HPI: Lindsey Bautista is a 55 y.o. female  Patient doing stable at best is still having episodes of conversion disorder presented by strokelike symptoms also has had cardiovascular-like symptoms. These workups remain negative. Patient's also been to the emergency room for similar symptoms and fortunately ER doctor reviewed patient's records and no further workup was done.  Reviewed cardiology notes patient does have atherosclerosis extending into the common iliac arteries but no claudication symptoms patient started Crestor 10 mg taking without problems patient patient to have follow-up there this fall.  Reviewed depression anxiety doing okay with fluoxetine and takes clonazepam 1 mg in the morning and half at night sometimes a whole. Patient has been to a psychiatrist. By patient report went 3 times was told to get a hobby and hasn't been back there is no adjustment to medications.  Relevant past medical, surgical, family and social history reviewed and updated as indicated. Interim medical history since our last visit reviewed. Allergies and medications reviewed and updated.  Review of Systems  Constitutional: Negative.   Respiratory: Negative.   Cardiovascular: Negative.     Per HPI unless specifically indicated above     Objective:    BP 134/76 (BP Location: Left Arm, Patient Position: Sitting, Cuff Size: Small)   Pulse 65   Temp 98 F (36.7 C)   Ht 5' 2.1" (1.577 m)   Wt 141 lb (64 kg)   SpO2 99%   BMI 25.71 kg/m   Wt Readings from Last 3 Encounters:  03/30/16 141 lb (64 kg)  03/20/16 135 lb (61.2 kg)  12/29/15 138 lb (62.6 kg)    Physical Exam  Constitutional: She is oriented to  person, place, and time. She appears well-developed and well-nourished. No distress.  HENT:  Head: Normocephalic and atraumatic.  Right Ear: Hearing normal.  Left Ear: Hearing normal.  Nose: Nose normal.  Eyes: Conjunctivae and lids are normal. Right eye exhibits no discharge. Left eye exhibits no discharge. No scleral icterus.  Cardiovascular: Normal rate, regular rhythm and normal heart sounds.   Pulmonary/Chest: Effort normal and breath sounds normal. No respiratory distress.  Musculoskeletal: Normal range of motion.  Neurological: She is alert and oriented to person, place, and time.  Skin: Skin is intact. No rash noted.  Psychiatric: She has a normal mood and affect. Her speech is normal and behavior is normal. Judgment and thought content normal. Cognition and memory are normal.    Results for orders placed or performed during the hospital encounter of [id number]  Basic metabolic panel  Result Value Ref Range   Sodium 138 135 - 145 mmol/L   Potassium 4.0 3.5 - 5.1 mmol/L   Chloride 105 101 - 111 mmol/L   CO2 26 22 - 32 mmol/L   Glucose, Bld 87 65 - 99 mg/dL   BUN 12 6 - 20 mg/dL   Creatinine, Ser 0.82 0.44 - 1.00 mg/dL   Calcium 9.0 8.9 - 10.3 mg/dL   GFR calc non Af Amer >60 >60 mL/min   GFR calc Af Amer >60 >60 mL/min   Anion gap  7 5 - 15  CBC  Result Value Ref Range   WBC 6.9 3.6 - 11.0 K/uL   RBC 4.32 3.80 - 5.20 MIL/uL   Hemoglobin 13.3 12.0 - 16.0 g/dL   HCT 37.2 35.0 - 47.0 %   MCV 86.1 80.0 - 100.0 fL   MCH 30.8 26.0 - 34.0 pg   MCHC 35.8 32.0 - 36.0 g/dL   RDW 13.7 11.5 - 14.5 %   Platelets 258 150 - 440 K/uL  Troponin I  Result Value Ref Range   Troponin I <0.03 <0.03 ng/mL      Assessment & Plan:   Problem List Items Addressed This Visit      Cardiovascular and Mediastinum   RESOLVED: Stroke (cerebrum) ([place])     Other   Depression    The current medical regimen is effective;  continue present plan and medications.       Relevant Medications    FLUoxetine (PROZAC) 20 MG capsule   Conversion disorder with mixed symptoms    Other Visit Diagnoses    Healthcare maintenance    -  Primary   Relevant Orders   HIV antibody   Hepatitis C Antibody       Follow up plan: Return in about 3 months (around 06/30/2016) for Physical Exam.

## 2016-03-31 LAB — HIV ANTIBODY (ROUTINE TESTING W REFLEX): HIV SCREEN 4TH GENERATION: NONREACTIVE

## 2016-03-31 LAB — HEPATITIS C ANTIBODY: Hep C Virus Ab: <0.1 s/co ratio (ref 0.0–0.9)

## 2016-04-03 ENCOUNTER — Encounter: Payer: Self-pay | Admitting: Family Medicine

## 2016-04-10 ENCOUNTER — Telehealth: Payer: Self-pay | Admitting: Cardiovascular Disease

## 2016-04-10 NOTE — Telephone Encounter (Signed)
Spoke w/ pt.   She reports that she received a recall letter to sched appt. She reports that she had a stroke in April. Friday night, she was at a ball game when she felt heaviness in her chest, denied pain. She states that she has "hardening of her arteries in her groin" and she is unsure if this is related. She states that her husband had difficulty getting her home, that when she got home, she sat in front of the fan for about an hour and started to feel better. She would like an appt for evaluation.  She did not call 911, b/c EMT had previously told her that they would take her to [facility] and not [facility]. Pt is sched to see Ignacia Bayley, NP on 05/05/16, added to wait list in the event of a cancellation.

## 2016-04-10 NOTE — Telephone Encounter (Signed)
Pt states this past Friday night she was at a football game, states she could not breathe, and heaviness in her chest. States these symptoms lasted for about 45 minutes. States her husband took her home and she sat in front of the fan and got something to drink, and felt better. States she did not take her BP at this time. States she has felt very tired and her legs have been hurting and feeling very weak since this happened. Please call.

## 2016-04-14 ENCOUNTER — Ambulatory Visit (INDEPENDENT_AMBULATORY_CARE_PROVIDER_SITE_OTHER): Payer: Medicaid Other | Admitting: Cardiovascular Disease

## 2016-04-14 ENCOUNTER — Encounter: Payer: Self-pay | Admitting: Cardiovascular Disease

## 2016-04-14 VITALS — BP 112/62 | HR 62 | Ht 61.0 in | Wt 140.5 lb

## 2016-04-14 DIAGNOSIS — R55 Syncope and collapse: Secondary | ICD-10-CM

## 2016-04-14 DIAGNOSIS — F17200 Nicotine dependence, unspecified, uncomplicated: Secondary | ICD-10-CM

## 2016-04-14 DIAGNOSIS — Z720 Tobacco use: Secondary | ICD-10-CM

## 2016-04-14 DIAGNOSIS — E785 Hyperlipidemia, unspecified: Secondary | ICD-10-CM

## 2016-04-14 DIAGNOSIS — R079 Chest pain, unspecified: Secondary | ICD-10-CM

## 2016-04-14 DIAGNOSIS — I700 Atherosclerosis of aorta: Secondary | ICD-10-CM

## 2016-04-14 DIAGNOSIS — I739 Peripheral vascular disease, unspecified: Secondary | ICD-10-CM | POA: Diagnosis not present

## 2016-04-14 MED ORDER — ALBUTEROL SULFATE HFA 108 (90 BASE) MCG/ACT IN AERS: 2.0000 | INHALATION_SPRAY | Freq: Four times a day (QID) | RESPIRATORY_TRACT | 2 refills | Status: DC | PRN

## 2016-04-14 MED ORDER — EZETIMIBE 10 MG PO TABS: 10.0000 mg | ORAL_TABLET | Freq: Every day | ORAL | 11 refills | Status: DC

## 2016-04-14 MED ORDER — ATORVASTATIN CALCIUM 80 MG PO TABS: 80.0000 mg | ORAL_TABLET | Freq: Every day | ORAL | 11 refills | Status: DC

## 2016-04-14 NOTE — Progress Notes (Signed)
Cardiology Office Note  Date:  04/14/2016   ID:  Lindsey, Bautista 23-Feb-1961, MRN [id number]  PCP:  Golden Pop, MD   Chief Complaint  Patient presents with  . Other    C/o chest pain and leg pain. Meds reviewed verbally with pt.    HPI:  Lindsey Bautista is a 55 year old woman with history of anxiety/depression, long smoking history for close to 40 years, copd, strong family history of coronary artery disease, history of stroke type symptoms in April 2015 where she could not talk for 4 days, right-sided weakness, was in the hospital was significant workup at that time, seen by neurology since then, continued waxing waning symptoms of weakness, sometimes with shortness of breath, excessive somnolence, who presents for prior episode of syncope And recent stroke symptoms  She reports that she has had numerous issues 11/2015, she developed right-sided weakness concerning for stroke Records reviewed [facility] to the hospital, was evaluated, nothing documented on MRI Minimal disease on carotid ultrasound Did PT, dramatic improvement in symptoms  Reports that she has severe leg pain bilaterally with walking  Recent walking to football game, grandson, this past weekend Tried to get up out of a chair, had severe leg weakness  Been developed SOB , chest tightness Has not had episodes like this before  Still smoking, 1/2 ppd  Also went to the ER 02/2016: chest pain Workup unrevealing, Discharged home Stress 04/2015: normal stress test  Echo 12/14/2015: normal EF   Lab work reviewed Total cholesterol 211, down from 263 Tolerating Lipitor 40 mg daily  Carotid u/s Mild plaque on the left  EKG shows normal sinus rhythm with rate 62 bpm, no significant ST or T-wave changes  Previous hx: Also reports that she has "passed out spells" sometimes when she is sitting, sometimes when she is standing.  Previous history of excess somnolence, random periods of weakness She is never injured herself when  she "passes out" even despite having events when she is standing up.  CT scan of the abdomen from December 2016 reviewed with her Images pulled up in the office, showing moderate diffuse PAD extending into the common iliac arteries  Previous episode of chest pain, had a stress test in 2015 which was reportedly normal.   stress test in 2013 which was normal by report.  Previous Orthostatics done in the office did not show any significant drop in blood pressure Currently not on any cardiac medications .   PMH:   has a past medical history of COPD (chronic obstructive pulmonary disease) ([place]); Depression; GERD (gastroesophageal reflux disease); Hypotension; IBS (irritable bowel syndrome); Right arm weakness; and Situational syncope.  PSH:    Past Surgical History:  Procedure Laterality Date  . COLONOSCOPY WITH PROPOFOL N/A 11/22/2015   Procedure: COLONOSCOPY WITH PROPOFOL;  Surgeon: Manya Silvas, MD;  Location: [facility] ENDOSCOPY;  Service: Endoscopy;  Laterality: N/A;  . ESOPHAGOGASTRODUODENOSCOPY (EGD) WITH PROPOFOL N/A 11/22/2015   Procedure: ESOPHAGOGASTRODUODENOSCOPY (EGD) WITH PROPOFOL;  Surgeon: Manya Silvas, MD;  Location: [facility] ENDOSCOPY;  Service: Endoscopy;  Laterality: N/A;  . TONSILLECTOMY    . VAGINAL HYSTERECTOMY      Current Outpatient Prescriptions  Medication Sig Dispense Refill  . aspirin 81 MG tablet Take 81 mg by mouth daily.    [name] atorvastatin (LIPITOR) 80 MG tablet Take 1 tablet (80 mg total) by mouth daily. 30 tablet 11  . clonazePAM (KLONOPIN) 1 MG tablet Take 1 tablet (1 mg total) by mouth 2 (two) times daily as  needed for anxiety. No alcohol with this 60 tablet 2  . cyclobenzaprine (FLEXERIL) 5 MG tablet Take 5 mg by mouth 3 (three) times daily as needed for muscle spasms.    [name] estradiol (ESTRACE) 1 MG tablet Take 1 mg by mouth daily.    [name] FLUoxetine (PROZAC) 20 MG capsule Take 1 capsule (20 mg total) by mouth daily. 30 capsule 12  . omeprazole (PRILOSEC) 20  MG capsule Take 20 mg by mouth daily.    . vitamin B-12 (CYANOCOBALAMIN) 1000 MCG tablet Take 1,000 mcg by mouth daily.    [name] albuterol (PROVENTIL HFA;VENTOLIN HFA) 108 (90 Base) MCG/ACT inhaler Inhale 2 puffs into the lungs every 6 (six) hours as needed for wheezing or shortness of breath. 1 Inhaler 2  . ezetimibe (ZETIA) 10 MG tablet Take 1 tablet (10 mg total) by mouth daily. 30 tablet 11   No current facility-administered medications for this visit.      Allergies:   Penicillin g and Iodinated diagnostic agents   Social History:  The patient  reports that she has been smoking Cigarettes.  She has a 25.00 pack-year smoking history. She has never used smokeless tobacco. She reports that she does not drink alcohol or use drugs.   Family History:   family history includes Breast cancer (age of onset: 43) in her paternal aunt.    Review of Systems: Review of Systems  Constitutional: Negative.        Episodes of leg weakness  Respiratory: Positive for shortness of breath.   Cardiovascular: Negative.   Gastrointestinal: Negative.   Musculoskeletal: Negative.   Neurological: Negative.   Psychiatric/Behavioral: Negative.   All other systems reviewed and are negative.    PHYSICAL EXAM: VS:  BP 112/62 (BP Location: Left Arm, Patient Position: Sitting, Cuff Size: Normal)   Pulse 62   Ht 5\' 1"  (1.549 m)   Wt 140 lb 8 oz (63.7 kg)   BMI 26.55 kg/m  , BMI Body mass index is 26.55 kg/m. GEN: Well nourished, well developed, in no acute distress  HEENT: normal  Neck: no JVD, carotid bruits, or masses Cardiac: RRR; no murmurs, rubs, or gallops,no edema  Respiratory:  clear to auscultation bilaterally, normal work of breathing GI: soft, nontender, nondistended, + BS MS: no deformity or atrophy  Skin: warm and dry, no rash Neuro:  Strength and sensation are intact Psych: euthymic mood, full affect    Recent Labs: 06/21/2015: TSH 1.350 12/13/2015: ALT 10 03/20/2016: BUN 12;  Creatinine, Ser 0.82; Hemoglobin 13.3; Platelets 258; Potassium 4.0; Sodium 138    Lipid Panel Lab Results  Component Value Date   CHOL 211 (H) 12/15/2015   HDL 44 12/15/2015   LDLCALC 143 (H) 12/15/2015   TRIG 118 12/15/2015      Wt Readings from Last 3 Encounters:  04/14/16 140 lb 8 oz (63.7 kg)  03/30/16 141 lb (64 kg)  03/20/16 135 lb (61.2 kg)       ASSESSMENT AND PLAN:  Chest pain, unspecified chest pain type - Plan: EKG 12-Lead Recent episode of Atypical chest pain  Previous episodes of chest pain, had stress test September 2016 No further testing at this time  Hyperlipidemia - Plan: LE ARTERIAL Recommended she increase Lipitor up to 80 mg daily, also start zetia  Goal LDL less than 70  PAD (peripheral artery disease) ([place]) - Plan: LE ARTERIAL Moderate PAD noted on CT scan of the abdomen pelvis We have recommended lower extremity arterial Doppler for claudication symptoms  Syncope and collapse Denies any recent episodes of syncope or near syncope  Abdominal aortic atherosclerosis ([place]) Previous CT scan results discussed with her, recommended cholesterol medication changes and smoking cessation  Smoker - Plan: LE ARTERIAL We have encouraged her to continue to work on weaning her cigarettes and smoking cessation. She will continue to work on this and does not want any assistance with chantix.   Claudication ([place]) - Plan: LE ARTERIAL Lower extremity arterial Doppler ordered   Total encounter time more than 25 minutes  Greater than 50% was spent in counseling and coordination of care with the patient   Disposition:   F/U  12 months   Orders Placed This Encounter  Procedures  . EKG 12-Lead     Signed, Esmond Plants, M.D., Ph.D. 04/14/2016  Ken Caryl, [place]

## 2016-04-14 NOTE — Patient Instructions (Addendum)
Medication Instructions:   Please increase the lipitor up to 80 mg daily Start zetia one a day For cholesterol  Use albuterol as needed for significant shortness of breath  Labwork:  No new labs  Testing/Procedures:  We will order a lower extremity arterial doppler For claudication symptoms   Follow-Up: It was a pleasure seeing you in the office today. Please call us if you have new issues that need to be addressed before your next appt.  [phone]  Your physician wants you to follow-up in: 6 months.  You will receive a reminder letter in the mail two months in advance. If you don't receive a letter, please call our office to schedule the follow-up appointment.  If you need a refill on your cardiac medications before your next appointment, please call your pharmacy.     Steps to Quit Smoking  Smoking tobacco can be harmful to your health and can affect almost every organ in your body. Smoking puts you, and those around you, at risk for developing many serious chronic diseases. Quitting smoking is difficult, but it is one of the best things that you can do for your health. It is never too late to quit. WHAT ARE THE BENEFITS OF QUITTING SMOKING? When you quit smoking, you lower your risk of developing serious diseases and conditions, such as:  Lung cancer or lung disease, such as COPD.  Heart disease.  Stroke.  Heart attack.  Infertility.  Osteoporosis and bone fractures. Additionally, symptoms such as coughing, wheezing, and shortness of breath may get better when you quit. You may also find that you get sick less often because your body is stronger at fighting off colds and infections. If you are pregnant, quitting smoking can help to reduce your chances of having a baby of low birth weight. HOW DO I GET READY TO QUIT? When you decide to quit smoking, create a plan to make sure that you are successful. Before you quit:  Pick a date to quit. Set a date within the  next two weeks to give you time to prepare.  Write down the reasons why you are quitting. Keep this list in places where you will see it often, such as on your bathroom mirror or in your car or wallet.  Identify the people, places, things, and activities that make you want to smoke (triggers) and avoid them. Make sure to take these actions:  Throw away all cigarettes at home, at work, and in your car.  Throw away smoking accessories, such as Scientist, research (medical).  Clean your car and make sure to empty the ashtray.  Clean your home, including curtains and carpets.  Tell your family, friends, and coworkers that you are quitting. Support from your loved ones can make quitting easier.  Talk with your health care provider about your options for quitting smoking.  Find out what treatment options are covered by your health insurance. WHAT STRATEGIES CAN I USE TO QUIT SMOKING?  Talk with your healthcare provider about different strategies to quit smoking. Some strategies include:  Quitting smoking altogether instead of gradually lessening how much you smoke over a period of time. Research shows that quitting "cold [place]" is more successful than gradually quitting.  Attending in-person counseling to help you build problem-solving skills. You are more likely to have success in quitting if you attend several counseling sessions. Even short sessions of 10 minutes can be effective.  Finding resources and support systems that can help you to quit smoking and  remain smoke-free after you quit. These resources are most helpful when you use them often. They can include:  Online chats with a Social worker.  Telephone quitlines.  Printed Furniture conservator/restorer.  Support groups or group counseling.  Text messaging programs.  Mobile phone applications.  Taking medicines to help you quit smoking. (If you are pregnant or breastfeeding, talk with your health care provider first.) Some medicines contain  nicotine and some do not. Both types of medicines help with cravings, but the medicines that include nicotine help to relieve withdrawal symptoms. Your health care provider may recommend:  Nicotine patches, gum, or lozenges.  Nicotine inhalers or sprays.  Non-nicotine medicine that is taken by mouth. Talk with your health care provider about combining strategies, such as taking medicines while you are also receiving in-person counseling. Using these two strategies together makes you more likely to succeed in quitting than if you used either strategy on its own. If you are pregnant or breastfeeding, talk with your health care provider about finding counseling or other support strategies to quit smoking. Do not take medicine to help you quit smoking unless told to do so by your health care provider. WHAT THINGS CAN I DO TO MAKE IT EASIER TO QUIT? Quitting smoking might feel overwhelming at first, but there is a lot that you can do to make it easier. Take these important actions:  Reach out to your family and friends and ask that they support and encourage you during this time. Call telephone quitlines, reach out to support groups, or work with a counselor for support.  Ask people who smoke to avoid smoking around you.  Avoid places that trigger you to smoke, such as bars, parties, or smoke-break areas at work.  Spend time around people who do not smoke.  Lessen stress in your life, because stress can be a smoking trigger for some people. To lessen stress, try:  Exercising regularly.  Deep-breathing exercises.  Yoga.  Meditating.  Performing a body scan. This involves closing your eyes, scanning your body from head to toe, and noticing which parts of your body are particularly tense. Purposefully relax the muscles in those areas.  Download or purchase mobile phone or tablet apps (applications) that can help you stick to your quit plan by providing reminders, tips, and encouragement.  There are many free apps, such as QuitGuide from the [employer] Office manager for Disease Control and Prevention). You can find other support for quitting smoking (smoking cessation) through smokefree.gov and other websites. HOW WILL I FEEL WHEN I QUIT SMOKING? Within the first 24 hours of quitting smoking, you may start to feel some withdrawal symptoms. These symptoms are usually most noticeable 2-3 days after quitting, but they usually do not last beyond 2-3 weeks. Changes or symptoms that you might experience include:  Mood swings.  Restlessness, anxiety, or irritation.  Difficulty concentrating.  Dizziness.  Strong cravings for sugary foods in addition to nicotine.  Mild weight gain.  Constipation.  Nausea.  Coughing or a sore throat.  Changes in how your medicines work in your body.  A depressed mood.  Difficulty sleeping (insomnia). After the first 2-3 weeks of quitting, you may start to notice more positive results, such as:  Improved sense of smell and taste.  Decreased coughing and sore throat.  Slower heart rate.  Lower blood pressure.  Clearer skin.  The ability to breathe more easily.  Fewer sick days. Quitting smoking is very challenging for most people. Do not get discouraged if you  are not successful the first time. Some people need to make many attempts to quit before they achieve long-term success. Do your best to stick to your quit plan, and talk with your health care provider if you have any questions or concerns.   This information is not intended to replace advice given to you by your health care provider. Make sure you discuss any questions you have with your health care provider.   Document Released: 08/01/2001 Document Revised: 12/22/2014 Document Reviewed: 12/22/2014 Elsevier Interactive Patient Education 2016 [employer]. Smoking Hazards Smoking cigarettes is extremely bad for your health. Tobacco smoke has over 200 known poisons in it. It contains the  poisonous gases nitrogen oxide and carbon monoxide. There are over 60 chemicals in tobacco smoke that cause cancer. Some of the chemicals found in cigarette smoke include:   Cyanide.   Benzene.   Formaldehyde.   Methanol (wood alcohol).   Acetylene (fuel used in welding torches).   Ammonia.  Even smoking lightly shortens your life expectancy by several years. You can greatly reduce the risk of medical problems for you and your family by stopping now. Smoking is the most preventable cause of death and disease in our society. Within days of quitting smoking, your circulation improves, you decrease the risk of having a heart attack, and your lung capacity improves. There may be some increased phlegm in the first few days after quitting, and it may take months for your lungs to clear up completely. Quitting for 10 years reduces your risk of developing lung cancer to almost that of a nonsmoker.  WHAT ARE THE RISKS OF SMOKING? Cigarette smokers have an increased risk of many serious medical problems, including:  Lung cancer.   Lung disease (such as pneumonia, bronchitis, and emphysema).   Heart attack and chest pain due to the heart not getting enough oxygen (angina).   Heart disease and peripheral blood vessel disease.   Hypertension.   Stroke.   Oral cancer (cancer of the lip, mouth, or voice box).   Bladder cancer.   Pancreatic cancer.   Cervical cancer.   Pregnancy complications, including premature birth.   Stillbirths and smaller newborn babies, birth defects, and genetic damage to sperm.   Early menopause.   Lower estrogen level for women.   Infertility.   Facial wrinkles.   Blindness.   Increased risk of broken bones (fractures).   Senile dementia.   Stomach ulcers and internal bleeding.   Delayed wound healing and increased risk of complications during surgery. Because of secondhand smoke exposure, children of smokers have an  increased risk of the following:   Sudden infant death syndrome (SIDS).   Respiratory infections.   Lung cancer.   Heart disease.   Ear infections.  WHY IS SMOKING ADDICTIVE? Nicotine is the chemical agent in tobacco that is capable of causing addiction or dependence. When you smoke and inhale, nicotine is absorbed rapidly into the bloodstream through your lungs. Both inhaled and noninhaled nicotine may be addictive.  WHAT ARE THE BENEFITS OF QUITTING?  There are many health benefits to quitting smoking. Some are:   The likelihood of developing cancer and heart disease decreases. Health improvements are seen almost immediately.   Blood pressure, pulse rate, and breathing patterns start returning to normal soon after quitting.   People who quit may see an improvement in their overall quality of life.  HOW DO YOU QUIT SMOKING? Smoking is an addiction with both physical and psychological effects, and longtime habits can  be hard to change. Your health care provider can recommend:  Programs and community resources, which may include group support, education, or therapy.  Replacement products, such as patches, gum, and nasal sprays. Use these products only as directed. Do not replace cigarette smoking with electronic cigarettes (commonly called e-cigarettes). The safety of e-cigarettes is unknown, and some may contain harmful chemicals. FOR MORE INFORMATION  American Lung Association: www.lung.org  American Cancer Society: www.cancer.org   This information is not intended to replace advice given to you by your health care provider. Make sure you discuss any questions you have with your health care provider.   Document Released: 09/14/2004 Document Revised: 05/28/2013 Document Reviewed: 01/27/2013 Elsevier Interactive Patient Education 2016 [place] WHAT IS SECONDHAND SMOKE? Secondhand smoke is smoke that comes from burning tobacco. It could be the smoke  from a cigarette, a pipe, or a cigar. Even if you are not the one smoking, secondhand smoke exposes you to the dangers of smoking. This is called involuntary, or passive, smoking. There are two types of secondhand smoke:  Sidestream smoke is the smoke that comes off the lighted end of a cigarette, pipe, or cigar.  This type of smoke has the highest amount of cancer-causing agents (carcinogens).  The particles in sidestream smoke are smaller. They get into your lungs more easily.  Mainstream smoke is the smoke that is exhaled by a person who is smoking.  This type of smoke is also dangerous to your health. HOW CAN SECONDHAND SMOKE AFFECT MY HEALTH? Studies show that there is no safe level of secondhand smoke. This smoke contains thousands of chemicals. At least 69 of them are known to cause cancer. Secondhand smoke can also cause many other health problems. It has been linked to:  Lung cancer.  Cancer of the voice box (larynx) or throat.  Cancer of the sinuses.  Brain cancer.  Bladder cancer.  Stomach cancer.  Breast cancer.  White blood cell cancers (lymphoma and leukemia).  Brain and liver tumors in children.  Heart disease and stroke in adults.  Pregnancy loss (miscarriage).  Diseases in children, such as:  Asthma.  Lung infections.  Ear infections.  Sudden infant death syndrome (SIDS).  Slow growth. WHERE CAN I BE AT RISK FOR EXPOSURE TO SECONDHAND SMOKE?   For adults, the workplace is the main source of exposure to secondhand smoke.  Your workplace should have a policy separating smoking areas from nonsmoking areas.  Smoking areas should have a system for ventilating and cleaning the air.  For children, the home may be the most dangerous place for exposure to secondhand smoke.  Children who live in apartment buildings may be at risk from smoke drifting from hallways or other people's homes.  For everyone, many public places are possible sources of  exposure to secondhand smoke.  These places include restaurants, shopping centers, and parks. HOW CAN I REDUCE MY RISK FOR EXPOSURE TO SECONDHAND SMOKE? The most important thing you can do is not smoke. Discourage family members from smoking. Other ways to reduce exposure for you and your family include the following:  Keep your home smoke free.  Make sure your child care providers do not smoke.  Warn your child about the dangers of smoking and secondhand smoke.  Do not allow smoking in your car. When someone smokes in a car, all the damaging chemicals from the smoke are confined in a small area.  Avoid public places where smoking is allowed.   This information  is not intended to replace advice given to you by your health care provider. Make sure you discuss any questions you have with your health care provider.   Document Released: 09/14/2004 Document Revised: 08/28/2014 Document Reviewed: 11/21/2013 Elsevier Interactive Patient Education [employer].

## 2016-04-19 ENCOUNTER — Telehealth: Payer: Self-pay

## 2016-04-19 NOTE — Telephone Encounter (Signed)
Prior auth for Zetia 10mg  obtained through [facility]. PA- [id number]. Local pharmacy notified.

## 2016-04-20 NOTE — Telephone Encounter (Signed)
Pt has been approved for Zetia  PA [id number] 04/19/16-04/19/17.

## 2016-05-02 ENCOUNTER — Other Ambulatory Visit: Payer: Self-pay | Admitting: Cardiovascular Disease

## 2016-05-02 DIAGNOSIS — I739 Peripheral vascular disease, unspecified: Secondary | ICD-10-CM

## 2016-05-05 ENCOUNTER — Ambulatory Visit: Payer: Medicaid Other | Admitting: Nurse Practitioner

## 2016-05-23 ENCOUNTER — Ambulatory Visit (INDEPENDENT_AMBULATORY_CARE_PROVIDER_SITE_OTHER): Payer: Medicaid Other

## 2016-05-23 DIAGNOSIS — E785 Hyperlipidemia, unspecified: Secondary | ICD-10-CM

## 2016-05-23 DIAGNOSIS — F17200 Nicotine dependence, unspecified, uncomplicated: Secondary | ICD-10-CM

## 2016-05-23 DIAGNOSIS — I739 Peripheral vascular disease, unspecified: Secondary | ICD-10-CM | POA: Diagnosis not present

## 2016-06-26 ENCOUNTER — Other Ambulatory Visit: Payer: Self-pay | Admitting: Family Medicine

## 2016-06-26 NOTE — Telephone Encounter (Signed)
Routing to provider  

## 2016-06-26 NOTE — Telephone Encounter (Signed)
call

## 2016-07-03 ENCOUNTER — Other Ambulatory Visit: Payer: Self-pay | Admitting: Family Medicine

## 2016-07-03 NOTE — Telephone Encounter (Signed)
Routing to provider  

## 2016-08-31 ENCOUNTER — Ambulatory Visit (INDEPENDENT_AMBULATORY_CARE_PROVIDER_SITE_OTHER): Payer: Medicare Other | Admitting: Family Medicine

## 2016-08-31 ENCOUNTER — Encounter: Payer: Self-pay | Admitting: Family Medicine

## 2016-08-31 VITALS — BP 132/84 | HR 99 | Ht 62.0 in | Wt 139.0 lb

## 2016-08-31 DIAGNOSIS — F447 Conversion disorder with mixed symptom presentation: Secondary | ICD-10-CM

## 2016-08-31 DIAGNOSIS — F419 Anxiety disorder, unspecified: Secondary | ICD-10-CM

## 2016-08-31 DIAGNOSIS — Z23 Encounter for immunization: Secondary | ICD-10-CM | POA: Diagnosis not present

## 2016-08-31 DIAGNOSIS — E785 Hyperlipidemia, unspecified: Secondary | ICD-10-CM

## 2016-08-31 DIAGNOSIS — Z0000 Encounter for general adult medical examination without abnormal findings: Secondary | ICD-10-CM | POA: Diagnosis not present

## 2016-08-31 LAB — LP+ALT+AST PICCOLO, WAIVED
ALT (SGPT) PICCOLO, WAIVED: 18 U/L (ref 10–47)
AST (SGOT) Piccolo, Waived: 25 U/L (ref 11–38)
CHOL/HDL RATIO PICCOLO,WAIVE: 2.2 mg/dL
Cholesterol Piccolo, Waived: 139 mg/dL (ref <200)
HDL Chol Piccolo, Waived: 63 mg/dL (ref >59)
LDL CHOL CALC PICCOLO WAIVED: 62 mg/dL (ref <100)
TRIGLYCERIDES PICCOLO,WAIVED: 71 mg/dL (ref <150)
VLDL CHOL CALC PICCOLO,WAIVE: 14 mg/dL (ref <30)

## 2016-08-31 MED ORDER — CLONAZEPAM 1 MG PO TABS: 1.0000 mg | ORAL_TABLET | Freq: Two times a day (BID) | ORAL | 2 refills | Status: DC

## 2016-08-31 NOTE — Assessment & Plan Note (Signed)
Stable on current medications 

## 2016-08-31 NOTE — Progress Notes (Signed)
   BP 132/84   Pulse 99   Ht 5\' 2"  (1.575 m)   Wt 139 lb (63 kg)   SpO2 (!) 63%   BMI 25.42 kg/m    Subjective:    Patient ID: Lindsey Bautista, female    DOB: August 03, 1961, 56 y.o.   MRN: [id number]  HPI: Lindsey Bautista is a 56 y.o. female  Chief Complaint  Patient presents with  . Annual Exam  no PE today  Right arm right leg still provide issues especially some swelling and also some trembling and patient still falling from time to time. Also having some worsening of memory Has some occasional confusion with words. Stress has been helped greatly with disability.   Relevant past medical, surgical, family and social history reviewed and updated as indicated. Interim medical history since our last visit reviewed. Allergies and medications reviewed and updated.  Review of Systems  Constitutional: Negative.   Respiratory: Negative.   Cardiovascular: Negative.     Per HPI unless specifically indicated above     Objective:    BP 132/84   Pulse 99   Ht 5\' 2"  (1.575 m)   Wt 139 lb (63 kg)   SpO2 (!) 63%   BMI 25.42 kg/m   Wt Readings from Last 3 Encounters:  08/31/16 139 lb (63 kg)  04/14/16 140 lb 8 oz (63.7 kg)  03/30/16 141 lb (64 kg)    Physical Exam  Constitutional: She is oriented to person, place, and time. She appears well-developed and well-nourished. No distress.  HENT:  Head: Normocephalic and atraumatic.  Right Ear: Hearing normal.  Left Ear: Hearing normal.  Nose: Nose normal.  Eyes: Conjunctivae and lids are normal. Right eye exhibits no discharge. Left eye exhibits no discharge. No scleral icterus.  Cardiovascular: Normal rate, regular rhythm and normal heart sounds.   Pulmonary/Chest: Effort normal and breath sounds normal. No respiratory distress.  Musculoskeletal: Normal range of motion.  Neurological: She is alert and oriented to person, place, and time.  Skin: Skin is intact. No rash noted.  Psychiatric: She has a normal mood and affect. Her  speech is normal and behavior is normal. Judgment and thought content normal. Cognition and memory are normal.    Results for orders placed or performed in visit on 03/30/16  HIV antibody  Result Value Ref Range   HIV Screen 4th Generation wRfx Non Reactive Non Reactive  Hepatitis C Antibody  Result Value Ref Range   Hep C Virus Ab <0.1 0.0 - 0.9 s/co ratio      Assessment & Plan:   Problem List Items Addressed This Visit      Other   Acute anxiety    Stable on Klonopin discussed occasional taking a half a tablet instead of a whole      Hyperlipidemia    Lipids doing well with medications no complaints      Relevant Orders   LP+ALT+AST Piccolo, Waived   Conversion disorder with mixed symptoms    Stable on current medications.       Other Visit Diagnoses    Annual physical exam    -  Primary   Need for influenza vaccination       Need for Tdap vaccination           Follow up plan: Return in about 3 months (around 11/29/2016) for Physical Exam.

## 2016-08-31 NOTE — Assessment & Plan Note (Signed)
Lipids doing well with medications no complaints

## 2016-08-31 NOTE — Assessment & Plan Note (Signed)
Stable on Klonopin discussed occasional taking a half a tablet instead of a whole

## 2016-09-19 ENCOUNTER — Telehealth: Payer: Self-pay | Admitting: Cardiovascular Disease

## 2016-09-19 ENCOUNTER — Encounter: Payer: Self-pay | Admitting: Cardiovascular Disease

## 2016-09-19 ENCOUNTER — Ambulatory Visit (INDEPENDENT_AMBULATORY_CARE_PROVIDER_SITE_OTHER): Payer: Medicare Other | Admitting: Cardiovascular Disease

## 2016-09-19 VITALS — BP 122/84 | HR 66 | Ht 61.0 in | Wt 139.0 lb

## 2016-09-19 DIAGNOSIS — R55 Syncope and collapse: Secondary | ICD-10-CM | POA: Diagnosis not present

## 2016-09-19 DIAGNOSIS — E782 Mixed hyperlipidemia: Secondary | ICD-10-CM

## 2016-09-19 DIAGNOSIS — I700 Atherosclerosis of aorta: Secondary | ICD-10-CM

## 2016-09-19 DIAGNOSIS — R0789 Other chest pain: Secondary | ICD-10-CM | POA: Diagnosis not present

## 2016-09-19 DIAGNOSIS — F17200 Nicotine dependence, unspecified, uncomplicated: Secondary | ICD-10-CM | POA: Diagnosis not present

## 2016-09-19 DIAGNOSIS — I739 Peripheral vascular disease, unspecified: Secondary | ICD-10-CM | POA: Diagnosis not present

## 2016-09-19 DIAGNOSIS — J432 Centrilobular emphysema: Secondary | ICD-10-CM

## 2016-09-19 NOTE — Telephone Encounter (Signed)
Pt ca stating she has been having some pains in her chest/yesterday it lasted about an hour Last night she had some pains again and took some nitro It did seemed to help  She is concerned about this  Pt c/o of Chest Pain: STAT if CP now or developed within 24 hours  1. Are you having CP right now? She is not having it now. States it is sore  2. Are you experiencing any other symptoms (ex. SOB, nausea, vomiting, sweating)? Did is have some SOB  3. How long have you been experiencing CP? Just yesterday   4. Is your CP continuous or coming and going? Coming and going   5. Have you taken Nitroglycerin? Yes    Please advise

## 2016-09-19 NOTE — Patient Instructions (Addendum)
Medication Instructions:   Ok to do a trial off the atorvastatin/lipitor for a few weeks If symptoms are better (muscle), call the office If no better, go back on the pilll  Labwork:  No new labs needed  Testing/Procedures:  We will schedule for lexiscan myoview, Unable to treadmill  [place]  Your caregiver has ordered a Stress Test with nuclear imaging. The purpose of this test is to evaluate the blood supply to your heart muscle. This procedure is referred to as a "Non-Invasive Stress Test." This is because other than having an IV started in your vein, nothing is inserted or "invades" your body. Cardiac stress tests are done to find areas of poor blood flow to the heart by determining the extent of coronary artery disease (CAD). Some patients exercise on a treadmill, which naturally increases the blood flow to your heart, while others who are  unable to walk on a treadmill due to physical limitations have a pharmacologic/chemical stress agent called Lexiscan . This medicine will mimic walking on a treadmill by temporarily increasing your coronary blood flow.   Please note: these test may take anywhere between 2-4 hours to complete  PLEASE REPORT TO [place] AT THE FIRST DESK WILL DIRECT YOU WHERE TO GO  Date of Procedure:___Wednesday, Jan 30____________  Arrival Time for Procedure:___9:45 am____________   How to prepare for your Myoview test:  1. Do not eat or drink after midnight 2. No caffeine for 24 hours prior to test 3. No smoking 24 hours prior to test. 4. Your medication may be taken with water.  If your doctor stopped a medication because of this test, do not take that medication. 5. Ladies, please do not wear dresses.  Skirts or pants are appropriate. Please wear a short sleeve shirt. 6. No perfume, cologne or lotion. 7.  I recommend watching educational videos on topics of interest to you at:       [email or web address]  Enter code:  [email or web address]    Follow-Up: It was a pleasure seeing you in the office today. Please call us if you have new issues that need to be addressed before your next appt.  [phone]  Your physician wants you to follow-up in: 12 months.  You will receive a reminder letter in the mail two months in advance. If you don't receive a letter, please call our office to schedule the follow-up appointment.    If you need a refill on your cardiac medications before your next appointment, please call your pharmacy.    Pharmacologic Stress Electrocardiogram A pharmacologic stress electrocardiogram is a heart (cardiac) test that uses nuclear imaging to evaluate the blood supply to your heart. This test may also be called a pharmacologic stress electrocardiography. Pharmacologic means that a medicine is used to increase your heart rate and blood pressure.  This stress test is done to find areas of poor blood flow to the heart by determining the extent of coronary artery disease (CAD). Some people exercise on a treadmill, which naturally increases the blood flow to the heart. For those people unable to exercise on a treadmill, a medicine is used. This medicine stimulates your heart and will cause your heart to beat harder and more quickly, as if you were exercising.  Pharmacologic stress tests can help determine:  The adequacy of blood flow to your heart during increased levels of activity in order to clear you for discharge home.  The extent of coronary artery blockage caused by CAD.  Your prognosis if you have suffered a heart attack.  The effectiveness of cardiac procedures done, such as an angioplasty, which can increase the circulation in your coronary arteries.  Causes of chest pain or pressure. LET [facility] CARE PROVIDER KNOW ABOUT:  Any allergies you have.  All medicines you are taking, including vitamins, herbs, eye drops, creams, and over-the-counter medicines.  Previous problems you or  members of your family have had with the use of anesthetics.  Any blood disorders you have.  Previous surgeries you have had.  Medical conditions you have.  Possibility of pregnancy, if this applies.  If you are currently breastfeeding. RISKS AND COMPLICATIONS Generally, this is a safe procedure. However, as with any procedure, complications can occur. Possible complications include:  You develop pain or pressure in the following areas:  Chest.  Jaw or neck.  Between your shoulder blades.  Radiating down your left arm.  Headache.  Dizziness or light-headedness.  Shortness of breath.  Increased or irregular heartbeat.  Low blood pressure.  Nausea or vomiting.  Flushing.  Redness going up the arm and slight pain during injection of medicine.  Heart attack (rare). BEFORE THE PROCEDURE   Avoid all forms of caffeine for 24 hours before your test or as directed by your health care provider. This includes coffee, tea (even decaffeinated tea), caffeinated sodas, chocolate, cocoa, and certain pain medicines.  Follow your health care provider's instructions regarding eating and drinking before the test.  Take your medicines as directed at regular times with water unless instructed otherwise. Exceptions may include:  If you have diabetes, ask how you are to take your insulin or pills. It is common to adjust insulin dosing the morning of the test.  If you are taking beta-blocker medicines, it is important to talk to your health care provider about these medicines well before the date of your test. Taking beta-blocker medicines may interfere with the test. In some cases, these medicines need to be changed or stopped 24 hours or more before the test.  If you wear a nitroglycerin patch, it may need to be removed prior to the test. Ask your health care provider if the patch should be removed before the test.  If you use an inhaler for any breathing condition, bring it with you  to the test.  If you are an outpatient, bring a snack so you can eat right after the stress phase of the test.  Do not smoke for 4 hours prior to the test or as directed by your health care provider.  Do not apply lotions, powders, creams, or oils on your chest prior to the test.  Wear comfortable shoes and clothing. Let your health care provider know if you were unable to complete or follow the preparations for your test. PROCEDURE   Multiple patches (electrodes) will be put on your chest. If needed, small areas of your chest may be shaved to get better contact with the electrodes. Once the electrodes are attached to your body, multiple wires will be attached to the electrodes, and your heart rate will be monitored.  An IV access will be started. A nuclear trace (isotope) is given. The isotope may be given intravenously, or it may be swallowed. Nuclear refers to several types of radioactive isotopes, and the nuclear isotope lights up the arteries so that the nuclear images are clear. The isotope is absorbed by your body. This results in low radiation exposure.  A resting nuclear image is taken to show  how your heart functions at rest.  A medicine is given through the IV access.  A second scan is done about 1 hour after the medicine injection and determines how your heart functions under stress.  During this stress phase, you will be connected to an electrocardiogram machine. Your blood pressure and oxygen levels will be monitored. AFTER THE PROCEDURE   Your heart rate and blood pressure will be monitored after the test.  You may return to your normal schedule, including diet,activities, and medicines, unless your health care provider tells you otherwise. This information is not intended to replace advice given to you by your health care provider. Make sure you discuss any questions you have with your health care provider. Document Released: 12/24/2008 Document Revised: 08/12/2013  Document Reviewed: 04/14/2013 Elsevier Interactive Patient Education  2017 [employer].

## 2016-09-19 NOTE — Telephone Encounter (Signed)
S/w pt who reports no active chest pain at this time. Describes a soreness in the middle of her chest. Denies dyspnea but did experience left shoulder and back pain yesterday morning. Described as "hurt worse than having a baby". It lasted an hour then resolved on its own. States she started to call EMS but states "we are hard-headed" Last evening she felt chest pressure, took 1 nitro, sx relieved.  Nausea x 2 weeks; no vomiting.  Per [facility], RN, ok for pt to come in 11am today to see Dr. Rockey Situ.  Pt agreeable and understands if sx resume, she would need to call EMS, proceed to ED.

## 2016-09-19 NOTE — Progress Notes (Signed)
Cardiology Office Note  Date:  09/19/2016   ID:  Lindsey Bautista, DOB 1961-01-14, MRN [id number]  PCP:  Golden Pop, MD   Chief Complaint  Patient presents with  . other    Pt. c/o weakness, dizziness, some shortness of breath and chest pain/pressure that started yesterday. Pt. c/o chest pressure now with an 8 on a scale from 1-10. Meds reviewed by the pt. verbally.      HPI:  Lindsey Bautista is a 56 year old woman with history of anxiety/depression, long smoking history for close to 40 years, strong family history of coronary artery disease, history of stroke type symptoms in April 2015 where she could not talk for 4 days, right-sided weakness, was in the hospital was significant workup at that time, seen by neurology since then, continued waxing waning symptoms of weakness, sometimes with shortness of breath, excessive somnolence, who presents for prior episode of syncope.  Got disability for CVA, memory Issues Crying in the office today, "stress", "hurts all over" Arms, legs are hurting, etiology unclear  Yesterday with chest pain NTG last night, better, never had it like that before  At baseline she reports having No energy Stays in bed all day Lives with husband  Last stress test 04/2015  continues to smoke, less than before, 1/2 ppd Trying to quit on her own  Saw crissman last week Labs reviewed LDL at goal less than 70 on high-dose Lipitor  EKG on today's visit shows normal sinus rhythm with rate 66 bpm, no significant ST or T-wave changes  Other past medical history reviewed Previously having "passed out spells" sometimes when she is sitting, sometimes when she is standing.  Previous history of excess somnolence, random periods of weakness She is never injured herself when she "passes out" even despite having events when she is standing up.  CT scan of the abdomen from December 2016 Images pulled up in the office, showing moderate diffuse PAD extending into the common  iliac arteries  Previous episode of chest pain, had a stress test in 2015 which was reportedly normal.   stress test in 2013 which was normal by report.  Previous Orthostatics done in the office did not show any significant drop in blood pressure Currently not on any cardiac medications .  PMH:   has a past medical history of COPD (chronic obstructive pulmonary disease) ([place]); Depression; GERD (gastroesophageal reflux disease); Hypotension; IBS (irritable bowel syndrome); Right arm weakness; and Situational syncope.  PSH:    Past Surgical History:  Procedure Laterality Date  . COLONOSCOPY WITH PROPOFOL N/A 11/22/2015   Procedure: COLONOSCOPY WITH PROPOFOL;  Surgeon: Manya Silvas, MD;  Location: [facility] ENDOSCOPY;  Service: Endoscopy;  Laterality: N/A;  . ESOPHAGOGASTRODUODENOSCOPY (EGD) WITH PROPOFOL N/A 11/22/2015   Procedure: ESOPHAGOGASTRODUODENOSCOPY (EGD) WITH PROPOFOL;  Surgeon: Manya Silvas, MD;  Location: [facility] ENDOSCOPY;  Service: Endoscopy;  Laterality: N/A;  . TONSILLECTOMY    . VAGINAL HYSTERECTOMY      Current Outpatient Prescriptions  Medication Sig Dispense Refill  . albuterol (PROVENTIL HFA;VENTOLIN HFA) 108 (90 Base) MCG/ACT inhaler Inhale 2 puffs into the lungs every 6 (six) hours as needed for wheezing or shortness of breath. 1 Inhaler 2  . aspirin 81 MG tablet Take 81 mg by mouth daily.    [name] atorvastatin (LIPITOR) 80 MG tablet Take 1 tablet (80 mg total) by mouth daily. 30 tablet 11  . clonazePAM (KLONOPIN) 1 MG tablet Take 1 tablet (1 mg total) by mouth 2 (two) times daily. [place]  tablet 2  . estradiol (ESTRACE) 1 MG tablet TAKE ONE TABLET BY MOUTH ONCE DAILY 30 tablet 5  . FLUoxetine (PROZAC) 20 MG capsule Take 1 capsule (20 mg total) by mouth daily. 30 capsule 12  . vitamin B-12 (CYANOCOBALAMIN) 1000 MCG tablet Take 1,000 mcg by mouth daily.     No current facility-administered medications for this visit.      Allergies:   Penicillin g and Iodinated diagnostic  agents   Social History:  The patient  reports that she has been smoking Cigarettes.  She has a 25.00 pack-year smoking history. She has never used smokeless tobacco. She reports that she does not drink alcohol or use drugs.   Family History:   family history includes Breast cancer (age of onset: 63) in her paternal aunt.    Review of Systems: Review of Systems  Constitutional: Positive for malaise/fatigue.  Respiratory: Negative.   Cardiovascular: Positive for chest pain.  Gastrointestinal: Negative.   Musculoskeletal: Negative.        "Arms and legs hurt all over"  Neurological: Negative.   Psychiatric/Behavioral: Positive for depression.  All other systems reviewed and are negative.    PHYSICAL EXAM: VS:  BP 122/84 (BP Location: Left Arm, Patient Position: Sitting, Cuff Size: Normal)   Pulse 66   Ht 5\' 1"  (1.549 m)   Wt 139 lb (63 kg)   BMI 26.26 kg/m  , BMI Body mass index is 26.26 kg/m. GEN: Well nourished, well developed, in no acute distress  HEENT: normal  Neck: no JVD, carotid bruits, or masses Cardiac: RRR; no murmurs, rubs, or gallops,no edema  Respiratory:  clear to auscultation bilaterally, normal work of breathing GI: soft, nontender, nondistended, + BS MS: no deformity or atrophy  Skin: warm and dry, no rash Neuro:  Strength and sensation are intact Psych: full affect, tearful in the exam room today, better at the end of the visit    Recent Labs: 03/20/2016: BUN 12; Creatinine, Ser 0.82; Hemoglobin 13.3; Platelets 258; Potassium 4.0; Sodium 138 08/31/2016: ALT (SGPT) [place], [place] 18    Lipid Panel Lab Results  Component Value Date   CHOL 139 08/31/2016   HDL 44 12/15/2015   LDLCALC 143 (H) 12/15/2015   TRIG 71 08/31/2016      Wt Readings from Last 3 Encounters:  09/19/16 139 lb (63 kg)  08/31/16 139 lb (63 kg)  04/14/16 140 lb 8 oz (63.7 kg)       ASSESSMENT AND PLAN:  PAD (peripheral artery disease) (HCC) Discussed symptoms of PAD  with her. She has adequate distal pulses on exam. No further testing at this time  Situational syncope No recent episodes of syncope per the patient  Abdominal aortic atherosclerosis ([place]) Smoking cessation, aggressive lipid management  Mixed hyperlipidemia - Plan: EKG 12-Lead Cholesterol is excellent but she is having body ache, arms and  legs. Recommended she hold the Lipitor for several weeks to see if she is having myalgias If symptoms better would try alternate statin  Smoker - Plan: EKG 12-Lead Discussed various ways to stop smoking including nicotine gum, lozenges  Centrilobular emphysema (HCC) Continues to smoke, chronic mild shortness of breath on exertion Smoking cessation recommended  Chest pressure - Plan: EKG 12-Lead Chest pain symptoms yesterday, moderate risk for coronary disease and ischemia Continues to smoke Discussed various treatment options with her, she took nitroglycerin for chest pain, very scared Reports she is unable to treadmill, try to treadmill before was unable to complete the test We  will schedule her for a pharmacologic Myoview to rule out ischemia  Depression/anxiety Tearful in the exam room today, long history of psych issues Will defer back to Dr. Jeananne Rama   Total encounter time more than 25 minutes  Greater than 50% was spent in counseling and coordination of care with the patient   Disposition:   F/U  12 months if stress test is normal   Orders Placed This Encounter  Procedures  . EKG 12-Lead     Signed, Esmond Plants, M.D., Ph.D. 09/19/2016  [place], [place]

## 2016-09-20 ENCOUNTER — Encounter
Admission: RE | Admit: 2016-09-20 | Discharge: 2016-09-20 | Disposition: A | Discharge status: Home / Self Care | Payer: Medicare Other | Source: Ambulatory Visit | Attending: Cardiovascular Disease | Admitting: Cardiovascular Disease

## 2016-09-20 DIAGNOSIS — F17200 Nicotine dependence, unspecified, uncomplicated: Secondary | ICD-10-CM | POA: Diagnosis not present

## 2016-09-20 DIAGNOSIS — R55 Syncope and collapse: Secondary | ICD-10-CM | POA: Diagnosis not present

## 2016-09-20 DIAGNOSIS — I700 Atherosclerosis of aorta: Secondary | ICD-10-CM | POA: Diagnosis not present

## 2016-09-20 DIAGNOSIS — E782 Mixed hyperlipidemia: Secondary | ICD-10-CM | POA: Diagnosis not present

## 2016-09-20 DIAGNOSIS — I739 Peripheral vascular disease, unspecified: Secondary | ICD-10-CM

## 2016-09-20 DIAGNOSIS — R0789 Other chest pain: Secondary | ICD-10-CM | POA: Diagnosis not present

## 2016-09-20 DIAGNOSIS — J432 Centrilobular emphysema: Secondary | ICD-10-CM

## 2016-09-20 LAB — NM MYOCAR MULTI W/SPECT W/WALL MOTION / EF
CHL CUP NUCLEAR SRS: 2
CHL CUP NUCLEAR SSS: 1
LV dias vol: 56 mL (ref 46–106)
LV sys vol: 22 mL
NUC STRESS TID: 1
Peak HR: 100 {beats}/min
Percent HR: 60 %
Rest HR: 55 {beats}/min
SDS: 0

## 2016-09-20 MED ORDER — TECHNETIUM TC 99M TETROFOSMIN IV KIT
32.8800 | PACK | Freq: Once | INTRAVENOUS | Status: AC | PRN
  Administered 2016-09-20: 32.88 via INTRAVENOUS

## 2016-09-20 MED ORDER — TECHNETIUM TC 99M TETROFOSMIN IV KIT
12.0600 | PACK | Freq: Once | INTRAVENOUS | Status: AC | PRN
  Administered 2016-09-20: 12.06 via INTRAVENOUS

## 2016-09-20 MED ORDER — REGADENOSON 0.4 MG/5ML IV SOLN
0.4000 mg | Freq: Once | INTRAVENOUS | Status: AC
  Administered 2016-09-20: 0.4 mg via INTRAVENOUS

## 2016-10-11 ENCOUNTER — Telehealth: Payer: Self-pay | Admitting: Family Medicine

## 2016-10-11 MED ORDER — AZITHROMYCIN 250 MG PO TABS: ORAL_TABLET | ORAL | 0 refills | Status: DC

## 2016-10-11 NOTE — Telephone Encounter (Signed)
rx sent

## 2016-10-11 NOTE — Telephone Encounter (Signed)
Z-Pak sent for patient with classic sinus symptoms and infection.

## 2016-10-11 NOTE — Telephone Encounter (Signed)
Message relayed to patient. Verbalized understanding and denied questions.   

## 2016-10-11 NOTE — Telephone Encounter (Signed)
Please advise 

## 2016-10-23 ENCOUNTER — Other Ambulatory Visit: Payer: Self-pay | Admitting: Internal Medicine

## 2016-10-23 DIAGNOSIS — E279 Disorder of adrenal gland, unspecified: Principal | ICD-10-CM

## 2016-10-23 DIAGNOSIS — E278 Other specified disorders of adrenal gland: Secondary | ICD-10-CM

## 2016-10-30 ENCOUNTER — Ambulatory Visit: Admission: RE | Admit: 2016-10-30 | Payer: Medicare Other | Source: Ambulatory Visit

## 2016-11-02 ENCOUNTER — Ambulatory Visit
Admission: RE | Admit: 2016-11-02 | Discharge: 2016-11-02 | Disposition: A | Discharge status: Home / Self Care | Payer: Medicare Other | Source: Ambulatory Visit | Attending: Internal Medicine | Admitting: Internal Medicine

## 2016-11-02 DIAGNOSIS — I700 Atherosclerosis of aorta: Secondary | ICD-10-CM | POA: Diagnosis not present

## 2016-11-02 DIAGNOSIS — E278 Other specified disorders of adrenal gland: Secondary | ICD-10-CM

## 2016-11-02 DIAGNOSIS — E249 Cushing's syndrome, unspecified: Secondary | ICD-10-CM | POA: Diagnosis present

## 2016-11-02 DIAGNOSIS — R935 Abnormal findings on diagnostic imaging of other abdominal regions, including retroperitoneum: Secondary | ICD-10-CM | POA: Diagnosis not present

## 2016-11-02 DIAGNOSIS — E279 Disorder of adrenal gland, unspecified: Secondary | ICD-10-CM | POA: Diagnosis not present

## 2016-11-16 ENCOUNTER — Emergency Department: Payer: Medicare Other

## 2016-11-16 ENCOUNTER — Encounter: Payer: Self-pay | Admitting: *Deleted

## 2016-11-16 ENCOUNTER — Observation Stay
Admission: EM | Admit: 2016-11-16 | Discharge: 2016-11-17 | Disposition: A | Discharge status: Home / Self Care | Payer: Medicare Other | Attending: Internal Medicine | Admitting: Internal Medicine

## 2016-11-16 DIAGNOSIS — Z8673 Personal history of transient ischemic attack (TIA), and cerebral infarction without residual deficits: Secondary | ICD-10-CM | POA: Diagnosis not present

## 2016-11-16 DIAGNOSIS — K219 Gastro-esophageal reflux disease without esophagitis: Secondary | ICD-10-CM | POA: Insufficient documentation

## 2016-11-16 DIAGNOSIS — J449 Chronic obstructive pulmonary disease, unspecified: Secondary | ICD-10-CM | POA: Diagnosis not present

## 2016-11-16 DIAGNOSIS — F329 Major depressive disorder, single episode, unspecified: Secondary | ICD-10-CM | POA: Diagnosis not present

## 2016-11-16 DIAGNOSIS — R079 Chest pain, unspecified: Secondary | ICD-10-CM | POA: Diagnosis not present

## 2016-11-16 DIAGNOSIS — I639 Cerebral infarction, unspecified: Secondary | ICD-10-CM | POA: Diagnosis not present

## 2016-11-16 DIAGNOSIS — Z8249 Family history of ischemic heart disease and other diseases of the circulatory system: Secondary | ICD-10-CM | POA: Insufficient documentation

## 2016-11-16 DIAGNOSIS — M542 Cervicalgia: Secondary | ICD-10-CM | POA: Diagnosis not present

## 2016-11-16 DIAGNOSIS — Z880 Allergy status to penicillin: Secondary | ICD-10-CM | POA: Insufficient documentation

## 2016-11-16 DIAGNOSIS — Z79899 Other long term (current) drug therapy: Secondary | ICD-10-CM | POA: Diagnosis not present

## 2016-11-16 DIAGNOSIS — M549 Dorsalgia, unspecified: Secondary | ICD-10-CM | POA: Diagnosis not present

## 2016-11-16 DIAGNOSIS — R479 Unspecified speech disturbances: Secondary | ICD-10-CM | POA: Insufficient documentation

## 2016-11-16 DIAGNOSIS — M62830 Muscle spasm of back: Secondary | ICD-10-CM | POA: Insufficient documentation

## 2016-11-16 DIAGNOSIS — G43109 Migraine with aura, not intractable, without status migrainosus: Secondary | ICD-10-CM | POA: Insufficient documentation

## 2016-11-16 DIAGNOSIS — F17210 Nicotine dependence, cigarettes, uncomplicated: Secondary | ICD-10-CM | POA: Diagnosis not present

## 2016-11-16 DIAGNOSIS — R531 Weakness: Secondary | ICD-10-CM

## 2016-11-16 DIAGNOSIS — Z7951 Long term (current) use of inhaled steroids: Secondary | ICD-10-CM | POA: Diagnosis not present

## 2016-11-16 DIAGNOSIS — K589 Irritable bowel syndrome without diarrhea: Secondary | ICD-10-CM | POA: Insufficient documentation

## 2016-11-16 DIAGNOSIS — G459 Transient cerebral ischemic attack, unspecified: Secondary | ICD-10-CM | POA: Diagnosis not present

## 2016-11-16 DIAGNOSIS — Z7982 Long term (current) use of aspirin: Secondary | ICD-10-CM | POA: Diagnosis not present

## 2016-11-16 DIAGNOSIS — I700 Atherosclerosis of aorta: Secondary | ICD-10-CM | POA: Diagnosis not present

## 2016-11-16 DIAGNOSIS — R0789 Other chest pain: Principal | ICD-10-CM | POA: Insufficient documentation

## 2016-11-16 DIAGNOSIS — E785 Hyperlipidemia, unspecified: Secondary | ICD-10-CM | POA: Insufficient documentation

## 2016-11-16 DIAGNOSIS — Z91041 Radiographic dye allergy status: Secondary | ICD-10-CM | POA: Insufficient documentation

## 2016-11-16 DIAGNOSIS — F411 Generalized anxiety disorder: Secondary | ICD-10-CM | POA: Diagnosis not present

## 2016-11-16 DIAGNOSIS — M6281 Muscle weakness (generalized): Secondary | ICD-10-CM | POA: Diagnosis not present

## 2016-11-16 DIAGNOSIS — I739 Peripheral vascular disease, unspecified: Secondary | ICD-10-CM | POA: Diagnosis not present

## 2016-11-16 DIAGNOSIS — I10 Essential (primary) hypertension: Secondary | ICD-10-CM | POA: Diagnosis not present

## 2016-11-16 LAB — CBC WITH DIFFERENTIAL/PLATELET
BASOS ABS: 0.1 10*3/uL (ref 0–0.1)
Basophils Relative: 1 %
EOS PCT: 1 %
Eosinophils Absolute: 0.1 10*3/uL (ref 0–0.7)
HCT: 39.9 % (ref 35.0–47.0)
HEMOGLOBIN: 13.6 g/dL (ref 12.0–16.0)
LYMPHS ABS: 3.3 10*3/uL (ref 1.0–3.6)
LYMPHS PCT: 41 %
MCH: 29.8 pg (ref 26.0–34.0)
MCHC: 34.1 g/dL (ref 32.0–36.0)
MCV: 87.4 fL (ref 80.0–100.0)
Monocytes Absolute: 0.6 10*3/uL (ref 0.2–0.9)
Monocytes Relative: 8 %
Neutro Abs: 3.9 10*3/uL (ref 1.4–6.5)
Neutrophils Relative %: 49 %
PLATELETS: 252 10*3/uL (ref 150–440)
RBC: 4.57 MIL/uL (ref 3.80–5.20)
RDW: 13.7 % (ref 11.5–14.5)
WBC: 8.1 10*3/uL (ref 3.6–11.0)

## 2016-11-16 LAB — BASIC METABOLIC PANEL
ANION GAP: 8 (ref 5–15)
BUN: 13 mg/dL (ref 6–20)
CHLORIDE: 103 mmol/L (ref 101–111)
CO2: 25 mmol/L (ref 22–32)
Calcium: 9.3 mg/dL (ref 8.9–10.3)
Creatinine, Ser: 0.83 mg/dL (ref 0.44–1.00)
GFR calc non Af Amer: >60 mL/min (ref >60)
Glucose, Bld: 91 mg/dL (ref 65–99)
POTASSIUM: 3.8 mmol/L (ref 3.5–5.1)
SODIUM: 136 mmol/L (ref 135–145)

## 2016-11-16 LAB — URINALYSIS, ROUTINE W REFLEX MICROSCOPIC
BILIRUBIN URINE: NEGATIVE
Glucose, UA: NEGATIVE mg/dL
Hgb urine dipstick: NEGATIVE
KETONES UR: NEGATIVE mg/dL
Leukocytes, UA: NEGATIVE
NITRITE: NEGATIVE
Protein, ur: NEGATIVE mg/dL
SPECIFIC GRAVITY, URINE: 1.009 (ref 1.005–1.030)
pH: 7 (ref 5.0–8.0)

## 2016-11-16 LAB — URINE DRUG SCREEN, QUALITATIVE (ARMC ONLY)
AMPHETAMINES, UR SCREEN: NOT DETECTED
BARBITURATES, UR SCREEN: NOT DETECTED
Benzodiazepine, Ur Scrn: NOT DETECTED
Cannabinoid 50 Ng, Ur ~~LOC~~: NOT DETECTED
Cocaine Metabolite,Ur ~~LOC~~: NOT DETECTED
MDMA (Ecstasy)Ur Screen: NOT DETECTED
METHADONE SCREEN, URINE: NOT DETECTED
Opiate, Ur Screen: NOT DETECTED
Phencyclidine (PCP) Ur S: NOT DETECTED
TRICYCLIC, UR SCREEN: NOT DETECTED

## 2016-11-16 LAB — ETHANOL: Alcohol, Ethyl (B): <5 mg/dL (ref <5)

## 2016-11-16 LAB — TROPONIN I: Troponin I: <0.03 ng/mL (ref <0.03)

## 2016-11-16 MED ORDER — ASPIRIN 81 MG PO CHEW
324.0000 mg | CHEWABLE_TABLET | Freq: Once | ORAL | Status: AC
  Administered 2016-11-16: 324 mg via ORAL
  Filled 2016-11-16: qty 4

## 2016-11-16 MED ORDER — BUTALBITAL-APAP-CAFFEINE 50-325-40 MG PO TABS
2.0000 | ORAL_TABLET | Freq: Once | ORAL | Status: AC
  Administered 2016-11-16: 2 via ORAL
  Filled 2016-11-16: qty 2

## 2016-11-16 NOTE — H&P (Signed)
[place] @ [facility] Admission History and Physical Harvie Bridge, D.O.  ---------------------------------------------------------------------------------------------------------------------   PATIENT NAME: Lindsey Bautista MR#: [id number] DATE OF BIRTH: 1961-02-14 DATE OF ADMISSION: 11/16/2016 PRIMARY CARE PHYSICIAN: Golden Pop, MD  REQUESTING/REFERRING PHYSICIAN: ED Dr. Dr. Kerman Passey  CHIEF COMPLAINT: Chief Complaint  Patient presents with  . Chest Pain    HISTORY OF PRESENT ILLNESS: Lindsey Bautista is a 56 y.o. female with a known history of COPD, GERD, hypotension, IBS, TIA, situational syncope, Hyperlipidemia, peripheral vascular disease, seizures, anxiety, depression  was in a usual state of health until 5:30 PM when she developed chest pain associated with speech disturbance and right sided weakness. Patient has been complaining of intermittent chest pain described as central,Radiating through to her back and associated with headache, muscular neck pain, nausea. She reports the following the chest pain episode today she developed speech disturbance and right-sided weakness which persisted through until she got to the emergency department. She reports associated left temporal headache. Patient's daughter states that she has had multiple episodes similar to this in the past and the workup as always been negative. Only new stressors the recent death of a cousin  In 01/04/2016 she was hospitalized for symptoms of weakness and had an extensive negative neurologic workup. Ultimately she had a discharge diagnosis of conversion disorder per neurology notes. She was seen again in our emergency department with similar symptoms and was discharged home.   Otherwise there has been no change in status. Patient has been taking medication as prescribed and there has been no recent change in medication or diet.  There has been no recent illness, travel or sick contacts.    Patient  denies fevers/chills, weakness, dizziness,  shortness of breath, N/V/C/D, abdominal pain, dysuria/frequency, changes in mental status.   EMS/ED COURSE:  Code stroke was called in the emergency department and initial workup has been negative. Patient received aspirin 361m. Telemetry neurology recommended admission for inpatient workup   PAST MEDICAL HISTORY: Past Medical History:  Diagnosis Date  . COPD [place] obstructive [place] disease) ([place]   . Depression   . GERD (gastroesophageal reflux disease)   . Hypotension   . IBS (irritable bowel syndrome)   . Right arm weakness   . Situational syncope   . TIA (transient ischemic attack)   Hyperlipidemia, peripheral vascular disease, seizures, anxiety, depression     PAST SURGICAL HISTORY: Past Surgical History:  Procedure Laterality Date  . COLONOSCOPY WITH PROPOFOL N/A 11/22/2015   Procedure: COLONOSCOPY WITH PROPOFOL;  Surgeon: RManya Silvas MD;  [facility]  Service: Endoscopy;  Laterality: N/A;  . ESOPHAGOGASTRODUODENOSCOPY (EGD) WITH PROPOFOL N/A 11/22/2015   Procedure: ESOPHAGOGASTRODUODENOSCOPY (EGD) WITH PROPOFOL;  Surgeon: RManya Silvas MD;  [facility];  Service: Endoscopy;  Laterality: N/A;  . TONSILLECTOMY    . VAGINAL HYSTERECTOMY        SOCIAL HISTORY: Social History  Substance Use Topics  . Smoking status: Current Every Day Smoker    Packs/day: 1.00    Years: 25.00    Types: Cigarettes  . Smokeless tobacco: Never Used  . Alcohol use No      FAMILY HISTORY: Family History   Medical History Relation Name Comments  Hypothyroidism Daughter    Thyroid disease Daughter    Hypothyroidism Daughter    Thyroid disease Daughter    COPD Father    High blood pressure (Hypertension) Father    Colon polyps Mother    Heart disease Mother  Heart failure Mother    High blood pressure (Hypertension) Mother    Myocardial Infarction (Heart attack) Mother      Breast cancer Paternal Aunt    Cancer Paternal Grandfather  mets of unkown etiology   Stroke Paternal Grandmother    Diabetes Sister       MEDICATIONS AT HOME: Prior to Admission medications   Medication Sig Start Date End Date Taking? Authorizing Provider  albuterol (PROVENTIL HFA;VENTOLIN HFA) 108 (90 Base) MCG/ACT inhaler Inhale 2 puffs into the lungs every 6 (six) hours as needed for wheezing or shortness of breath. 04/14/16   Minna Merritts, MD  aspirin 81 MG tablet Take 81 mg by mouth daily.    Historical Provider, MD  atorvastatin (LIPITOR) 80 MG tablet Take 1 tablet (80 mg total) by mouth daily. 04/14/16   Minna Merritts, MD  clonazePAM (KLONOPIN) 1 MG tablet Take 1 tablet (1 mg total) by mouth 2 (two) times daily. 08/31/16   [name], MD  estradiol (ESTRACE) 1 MG tablet TAKE ONE TABLET BY MOUTH ONCE DAILY 07/04/16   [name], MD  FLUoxetine (PROZAC) 20 MG capsule Take 1 capsule (20 mg total) by mouth daily. 03/30/16   [name], MD  vitamin B-12 (CYANOCOBALAMIN) 1000 MCG tablet Take 1,000 mcg by mouth daily.    Historical Provider, MD      DRUG ALLERGIES: Allergies  Allergen Reactions  . Penicillin G Anaphylaxis and Other (See Comments)    Has patient had a PCN reaction causing immediate rash, facial/tongue/throat swelling, SOB or lightheadedness with hypotension: [id number]} Has patient had a PCN reaction causing severe rash involving mucus membranes or skin necrosis: no:[id number]} Has patient had a PCN reaction that required hospitalization no:[id number]} Has patient had a PCN reaction occurring within the last 10 years: [id number]} If all of the above answers are "NO", then may proceed with Cephalosporin use.   . Iodinated Diagnostic Agents Rash and Nausea And Vomiting     REVIEW OF SYSTEMS: CONSTITUTIONAL: No fever/chills, fatigue, weakness, weight gain/loss, headache EYES: No blurry or double vision. ENT: No tinnitus, postnasal drip,  redness or soreness of the oropharynx. RESPIRATORY: No cough, wheeze, hemoptysis, dyspnea. CARDIOVASPositive chest pain, negative orthopnea, palpitations, syncope. GASTROINTESTINAL: No nausea, vomiting, constipation, diarrhea, abdominal pain, hematemesis, melena or hematochezia. GENITOURINARY: No dysuria or hematuria. ENDOCRINE: No polyuria or nocturia. No heat or cold intolerance. HEMATOLOGY: No anemia, bruising, bleeding. INTEGUMENTARY: No rashes, ulcers, lesions. MUSCULOSKELETAL: No arthritis, swelling, gout. NEURO: Positive speech disturbance and right-sided weakness. No numbness, tingling,  or ataxia. No seizure-type activity. PSYCHIATRIC: No anxiety, depression, insomnia.  PHYSICAL EXAMINATION: VITAL SIGNS: Blood pressure (!) 141/54, pulse (!) 58, temperature 98.3 F (36.8 C), resp. rate 13, height 5' 1" (1.549 m), weight 65.5 kg (144 lb 8 oz), SpO2 97 %.  GENERAL: 56 y.o.-year-old  female patient, well-developed, well-nourished lying in the bed in no moderate distress. Anxious   HEENT: Head atraumatic, normocephalic. Pupils equal, round, reactive to light and accommodation. No scleral icterus. Extraocular muscles intact. Nares are patent. Oropharynx is clear. Mucus membranes moist. Positive left-sided temporal tenderness to palpation NECK: Significant cervical paravertebral muscle spasm as well as trapezius spasm and tenderness palpation. Supple, full range of motion. No JVD, no bruit heard. No thyroid enlargement, no tenderness, no cervical lymphadenopathy. CHEST: Normal breath sounds bilaterally. No wheezing, rales, rhonchi or crackles. No use of accessory muscles of respiration.  No reproducible chest wall tenderness.  CARDIOVASCULAR: S1, S2 normal. No murmurs, rubs, or gallops.  Cap refill <2 seconds. ABDOMEN: Soft, nontender, nondistended. No rebound, guarding, rigidity. Normoactive bowel sounds present in all four quadrants. No organomegaly or mass. EXTREMITIES: Full range of  motion. No pedal edema, cyanosis, or clubbing. NEUROLOGIC: Cranial nerves II through XII are grossly intact with no focal sensorimotor deficit. Muscle strength 5/5 in all extremities. Sensation intact. Gait not checked. PSYCHIATRIC: The patient is alert and oriented x 3. Normal affect, mood, thought content. SKIN: Warm, dry, and intact without obvious rash, lesion, or ulcer.  LABORATORY PANEL:  CBC  Recent Labs Lab 11/16/16 1921  WBC 8.1  HGB 13.6  HCT 39.9  PLT 252   ----------------------------------------------------------------------------------------------------------------- Chemistries  Recent Labs Lab 11/16/16 1921  NA 136  K 3.8  CL 103  CO2 25  GLUCOSE 91  BUN 13  CREATININE 0.83  CALCIUM 9.3   ------------------------------------------------------------------------------------------------------------------ Cardiac Enzymes  Recent Labs Lab 11/16/16 1921  TROPONINI <0.03   ------------------------------------------------------------------------------------------------------------------  RADIOLOGY: Ct Head Wo Contrast  Result Date: 11/16/2016 CLINICAL DATA:  Right-sided weakness EXAM: CT HEAD WITHOUT CONTRAST TECHNIQUE: Contiguous axial images were obtained from the base of the skull through the vertex without intravenous contrast. COMPARISON:  12/13/2015 MRI and CT of the head FINDINGS: BRAIN: The ventricles and sulci are normal. No intraparenchymal hemorrhage, mass effect nor midline shift. No acute large vascular territory infarcts. Grey-white matter distinction is maintained. The basal ganglia are unremarkable. No abnormal extra-axial fluid collections. Basal cisterns are not effaced and midline. The brainstem and cerebellar hemispheres are without acute abnormalities. VASCULAR: Unremarkable. SKULL/SOFT TISSUES: No skull fracture. No significant soft tissue swelling. ORBITS/SINUSES: The included ocular globes and orbital contents are normal.The mastoid air  cells are clear. The included paranasal sinuses are well-aerated. OTHER: None. IMPRESSION: No acute intracranial abnormality Electronically Signed   By: Ashley Royalty M.D.   On: 11/16/2016 20:01   Dg Chest Portable 1 View  Result Date: 11/16/2016 CLINICAL DATA:  Right-sided weakness EXAM: PORTABLE CHEST 1 VIEW COMPARISON:  None. FINDINGS: The heart size and mediastinal contours are within normal limits. Aortic atherosclerosis without aneurysm. Both lungs are clear. The visualized skeletal structures are nonacute. Probable small bone island of the left humeral head. IMPRESSION: No active disease.  Aortic atherosclerosis. Electronically Signed   By: Ashley Royalty M.D.   On: 11/16/2016 20:00    EKG: Normal sinus rhythm at 57 bpm with a normal axis and nonspecific ST and T wave changes.  IMPRESSION AND PLAN:  This is a 56 y.o. female with a history of COPD, GERD, hypotension, IBS, TIA, situational syncope, Hyperlipidemia, peripheral vascular disease, seizures, anxiety, depression  now being admitted with:  1. Right-sided weakness and dysarthria, rule out TIA/CVA - Admit telemetry observation for neuro workup including: - Studies: MRA/MRI, Echo, Carotids - Labs: CBC, BMP, Lipids, TFTs, A1C - Nursing: Neurochecks, O2, dysphagia screen, permissive hypertension.  - Consults: Neurology, PT/OT, S/S consults.  - Meds: Increase aspirin to 325 mg  - Fluids: Hep-Lock -Hold Clonopin at this time for neuro checks. - Routine DVT Px: with Lovenox, SCDs, early ambulation  2. Chest pain -Monitor on telemetry -Trend troponins, check TSH and lipids  3. Temporal headache -Check ESR  4. Cervicocephalgia secondary muscle spasm -Trial of muscle relaxants  5. History of depression -Continue Prozac   Admission status: Observation, telemetry IV fluids: Hep-Lock Diet: Nothing by mouth Consults: Neurology DVT prophylaxis: Lovenox, SCDs, early ambulation Code Status: Full Disposition plan: To home less than  24 hours   All the records are reviewed and case  discussed with ED provider. Management plans discussed with the patient and/or family who express understanding and agree with plan of care.   TOTAL TIME TAKING CARE OF THIS PATIENT: 60 minutes.     D.O. on 11/16/2016 at 11:04 PM Between 7am to 6pm - Pager - [phone] After 6pm go to [email or web address] - [employer] [place] [facility] Office [phone] CC: Primary care physician; Golden Pop, MD     Note: This dictation was prepared with Dragon dictation along with smaller phrase technology. Any transcriptional errors that result from this process are unintentional.

## 2016-11-16 NOTE — ED Triage Notes (Signed)
Pt to ED via EMS from home c/o chest pain radiating to back and neck.  Patient denies injury.  States pain is sharp and heavy.  Patient received 1 SL nitro with some relief.  Per EMS family noticed patient left mouth "drawn up".  Patient unsure of exact last known well time.  Hx of TIA march 2017 with speech difficulty that has since resolved, hx of anxiety.  Patient is A&Ox4, difficulty speaking, tearful, and weakness to right side extremities.  EMS vitals 154/77 BP, 93 CBG.  MD notified and aware.

## 2016-11-16 NOTE — ED Provider Notes (Addendum)
[facility] Emergency Department Provider Note  Time seen: 8:27 PM  I have reviewed the triage vital signs and the nursing notes.   HISTORY  Chief Complaint Chest Pain    HPI Lindsey Bautista is a 56 y.o. female with a past medical history of COPD, gastric grief as, depression, IBS, TIA, presents to the emergency department with chest pain and right-sided weakness. According to the patient at 5:30 PM she developed chest pain with difficulty speaking and right-sided weakness. Family states for the past one or 2 weeks the patient has been complaining of intermittent chest pain. They state this has happened 5 times over the past several years in which the patient will develop chest pain and then developed weakness. Patient was last seen at [facility] for the same symptoms and ultimately found everything to be normal including MRIs and EEGs. Patient was discharged home. In the chart there is a diagnoses of possible conversion disorder. Currently the patient states mild chest discomfort continues stay moderate right-sided weakness.  Past Medical History:  Diagnosis Date  . COPD (chronic obstructive pulmonary disease) ([place])   . Depression   . GERD (gastroesophageal reflux disease)   . Hypotension   . IBS (irritable bowel syndrome)   . Right arm weakness   . Situational syncope   . TIA (transient ischemic attack)     Patient Active Problem List   Diagnosis Date Noted  . Conversion disorder with mixed symptoms 03/30/2016  . Abdominal aortic atherosclerosis ([place]) 12/29/2015  . Adrenal gland cyst ([place]) 12/29/2015  . Anxiety state   . Syncope and collapse   . COPD (chronic obstructive pulmonary disease) ([place]) 12/13/2015  . Chest pain 12/13/2015  . GERD (gastroesophageal reflux disease)   . Gastroesophageal reflux disease without esophagitis   . PAD (peripheral artery disease) ([place]) 10/22/2015  . Hyperlipidemia 10/22/2015  . Cervical paraspinal muscle spasm  09/30/2015  . Benzodiazepine dependence, continuous ([place]) 09/08/2015  . Acute anxiety 06/07/2015  . Depression 06/07/2015  . Situational syncope 06/07/2015  . Smoker 04/21/2015    Past Surgical History:  Procedure Laterality Date  . COLONOSCOPY WITH PROPOFOL N/A 11/22/2015   Procedure: COLONOSCOPY WITH PROPOFOL;  Surgeon: Manya Silvas, MD;  Location: [facility] ENDOSCOPY;  Service: Endoscopy;  Laterality: N/A;  . ESOPHAGOGASTRODUODENOSCOPY (EGD) WITH PROPOFOL N/A 11/22/2015   Procedure: ESOPHAGOGASTRODUODENOSCOPY (EGD) WITH PROPOFOL;  Surgeon: Manya Silvas, MD;  Location: [facility] ENDOSCOPY;  Service: Endoscopy;  Laterality: N/A;  . TONSILLECTOMY    . VAGINAL HYSTERECTOMY      Prior to Admission medications   Medication Sig Start Date End Date Taking? Authorizing Provider  albuterol (PROVENTIL HFA;VENTOLIN HFA) 108 (90 Base) MCG/ACT inhaler Inhale 2 puffs into the lungs every 6 (six) hours as needed for wheezing or shortness of breath. 04/14/16   Minna Merritts, MD  aspirin 81 MG tablet Take 81 mg by mouth daily.    Historical Provider, MD  atorvastatin (LIPITOR) 80 MG tablet Take 1 tablet (80 mg total) by mouth daily. 04/14/16   Minna Merritts, MD  azithromycin (ZITHROMAX) 250 MG tablet 2 now then 1 a day 10/11/16   [name], MD  clonazePAM (KLONOPIN) 1 MG tablet Take 1 tablet (1 mg total) by mouth 2 (two) times daily. 08/31/16   [name], MD  estradiol (ESTRACE) 1 MG tablet TAKE ONE TABLET BY MOUTH ONCE DAILY 07/04/16   [name], MD  FLUoxetine (PROZAC) 20 MG capsule Take 1 capsule (20  mg total) by mouth daily. 03/30/16   [name], MD  vitamin B-12 (CYANOCOBALAMIN) 1000 MCG tablet Take 1,000 mcg by mouth daily.    Historical Provider, MD    Allergies  Allergen Reactions  . Penicillin G Anaphylaxis and Other (See Comments)    Has patient had a PCN reaction causing immediate rash, facial/tongue/throat swelling, SOB or lightheadedness with hypotension:  [id number]} Has patient had a PCN reaction causing severe rash involving mucus membranes or skin necrosis: no:[id number]} Has patient had a PCN reaction that required hospitalization no:[id number]} Has patient had a PCN reaction occurring within the last 10 years: [id number]} If all of the above answers are "NO", then may proceed with Cephalosporin use.   . Iodinated Diagnostic Agents Rash and Nausea And Vomiting    Family History  Problem Relation Age of Onset  . Heart disease    . COPD    . Stroke    . Breast cancer Paternal Aunt 67    Social History Social History  Substance Use Topics  . Smoking status: Current Every Day Smoker    Packs/day: 1.00    Years: 25.00    Types: Cigarettes  . Smokeless tobacco: Never Used  . Alcohol use No    Review of Systems Constitutional: Negative for fever. Cardiovascular: Mild chest pain. Respiratory: Negative for shortness of breath. Gastrointestinal: Negative for abdominal pain Neurological: Negative for headache 10-point ROS otherwise negative.  ____________________________________________   PHYSICAL EXAM:  VITAL SIGNS: ED Triage Vitals  Enc Vitals Group     BP 11/16/16 1905 (!) 155/82     Pulse Rate 11/16/16 1905 64     Resp 11/16/16 1952 18     Temp 11/16/16 1905 98.3 F (36.8 C)     Temp Source 11/16/16 1905 Oral     SpO2 11/16/16 1905 98 %     Weight 11/16/16 1929 144 lb 8 oz (65.5 kg)     Height 11/16/16 1929 5\' 1"  (1.549 m)     Head Circumference --      Peak Flow --      Pain Score 11/16/16 1928 4     Pain Loc --      Pain Edu? --      Excl. in [place]? --    Constitutional: Alert and oriented. Well appearing and in no distress. Eyes: Normal exam ENT   Head: Normocephalic and atraumatic.   Mouth/Throat: Mucous membranes are moist. Cardiovascular: Normal rate, regular rhythm. No murmur Respiratory: Normal respiratory effort without tachypnea nor retractions. Breath sounds are clear Gastrointestinal: Soft  and nontender. No distention.   Musculoskeletal: Nontender with normal range of motion in all extremities. Neurologic: Normal language however the patient's speech is soft. No cranial nerve deficits. The patient has 4/5 motor in right upper extremity and 3/5 motor in right lower extremity. However no pronator drift on exam, no right upper extremity drift on exam. Sensation appears to be intact. Skin:  Skin is warm, dry and intact.  Psychiatric: Mood and affect are normal.  ____________________________________________    EKG  EKG reviewed and interpreted by myself shows normal sinus rhythm at 57 bpm, narrow QRS, normal axis, normal intervals, no ST changes. Normal EKG.  ____________________________________________    RADIOLOGY  Chest x-ray negative  CT head negative  ____________________________________________   INITIAL IMPRESSION / ASSESSMENT AND PLAN / ED COURSE  Pertinent labs & imaging results that were available during my care of the patient were reviewed by me and considered in my medical  decision making (see chart for details).  Patient presents the emergency department with chest pain and right-sided deficits with difficulty speaking beginning at 5:30 PM. Currently the patient is able to speak complete uses a very soft voice. She does have 4/5 strength in the right upper extremity and 3/5 strength in the right lower extremity. However once the arm is elevated she does not have any drift nor pronator drift for the full 10 seconds. No cranial nerve deficits identified. In reviewing the patient's records she was last seen in [facility] for similar issues. Was found have a negative MRI and a normal EEG. In reviewing the patient's chart there is a diagnosis of possible conversion disorder. However as the patient has right-sided deficits that began approximately 3 hours previously a code stroke has been ordered and we will have neurology see the patient to discuss further  treatment and disposition.  Patient CT head is negative. EKG is reassuring. Lab work including troponin is normal.  Telemetry neurology has seen the patient and recommend admission to the hospital for CVA workup given her continued weakness and difficulty speaking. Patient is agreeable to this plan. I have ordered aspirin for the patient.  NIH Stroke Scale    Time: 10:36 PM Person Administering Scale: Melodie Ashworth  Administer stroke scale items in the order listed. Record performance in each category after each subscale exam. Do not go back and change scores. Follow directions provided for each exam technique. Scores should reflect what the patient does, not what the clinician thinks the patient can do. The clinician should record answers while administering the exam and work quickly. Except where indicated, the patient should not be coached (i.e., repeated requests to patient to make a special effort).   1a  Level of consciousness: 0=alert; keenly responsive  1b. LOC questions:  0=Performs both tasks correctly  1c. LOC commands: 0=Performs both tasks correctly  2.  Best Gaze: 0=normal  3.  Visual: 0=No visual loss  4. Facial Palsy: 0=Normal symmetric movement  5a.  Motor left arm: 0=No drift, limb holds 90 (or 45) degrees for full 10 seconds  5b.  Motor right arm: 2=Some effort against gravity, limb cannot get to or maintain (if cured) 90 (or 45) degrees, drifts down to bed, but has some effort against gravity  6a. motor left leg: 0=No drift, limb holds 90 (or 45) degrees for full 10 seconds  6b  Motor right leg:  2=Some effort against gravity, limb cannot get to or maintain (if cured) 90 (or 45) degrees, drifts down to bed, but has some effort against gravity  7. Limb Ataxia: 0=Absent  8.  Sensory: 1=Mild to moderate sensory loss; patient feels pinprick is less sharp or is dull on the affected side; there is a loss of superficial pain with pinprick but patient is aware She is being  touched  9. Best Language:  0=No aphasia, normal  10. Dysarthria: 1=Mild to moderate, patient slurs at least some words and at worst, can be understood with some difficulty  11. Extinction and Inattention: 0=No abnormality  12. Distal motor function: 0=Normal   Total:   6     ____________________________________________   FINAL CLINICAL IMPRESSION(S) / ED DIAGNOSES  Chest pain Right-sided weakness    Harvest Dark, MD 11/16/16 8295    Harvest Dark, MD 11/16/16 2238

## 2016-11-16 NOTE — ED Notes (Signed)
Dr. Deniece Ree present on [facility]. Family at bedside.

## 2016-11-16 NOTE — ED Notes (Signed)
CODE STROKE CALLED TO [id number] 

## 2016-11-16 NOTE — ED Notes (Signed)
Pt reports "severe headache," pt also reports "tylenol or ibuprofen will not work." MD notified. See MAR.

## 2016-11-16 NOTE — ED Notes (Signed)
Patient ambulated with a steady gait to the room commode. Patient uses right arm easily to drink water and eat cracker.

## 2016-11-16 NOTE — ED Notes (Signed)
Patient has right arm movement, but cannot keep the right arm raised. Patient has equal pedal strength, but can't keep right leg raised.

## 2016-11-16 NOTE — Progress Notes (Signed)
Chaplain responded to a code stroke. Patient was in the room with her two family members. Patient said that she saw some doctors and she was waiting for a cardiologist. Chaplain prayed with patient and her family members who were present.   11/16/16 2100  Clinical Encounter Type  Visited With Patient and family together  Visit Type Initial  Referral From Nurse  Consult/Referral To Chaplain  Spiritual Encounters  Spiritual Needs Prayer

## 2016-11-16 NOTE — ED Notes (Signed)
SoC is completed.

## 2016-11-17 ENCOUNTER — Observation Stay (HOSPITAL_COMMUNITY): Payer: Medicare Other

## 2016-11-17 ENCOUNTER — Observation Stay (HOSPITAL_BASED_OUTPATIENT_CLINIC_OR_DEPARTMENT_OTHER)
Admit: 2016-11-17 | Discharge: 2016-11-17 | Disposition: A | Discharge status: Home / Self Care | Payer: Medicare Other | Attending: Family Medicine | Admitting: Family Medicine

## 2016-11-17 ENCOUNTER — Observation Stay: Payer: Medicare Other

## 2016-11-17 DIAGNOSIS — R531 Weakness: Secondary | ICD-10-CM

## 2016-11-17 DIAGNOSIS — G43109 Migraine with aura, not intractable, without status migrainosus: Secondary | ICD-10-CM | POA: Diagnosis not present

## 2016-11-17 DIAGNOSIS — G459 Transient cerebral ischemic attack, unspecified: Secondary | ICD-10-CM | POA: Diagnosis not present

## 2016-11-17 DIAGNOSIS — R0789 Other chest pain: Secondary | ICD-10-CM | POA: Diagnosis not present

## 2016-11-17 DIAGNOSIS — K219 Gastro-esophageal reflux disease without esophagitis: Secondary | ICD-10-CM | POA: Diagnosis not present

## 2016-11-17 DIAGNOSIS — I10 Essential (primary) hypertension: Secondary | ICD-10-CM | POA: Diagnosis not present

## 2016-11-17 DIAGNOSIS — E785 Hyperlipidemia, unspecified: Secondary | ICD-10-CM | POA: Diagnosis not present

## 2016-11-17 LAB — LIPID PANEL
CHOLESTEROL: 228 mg/dL — AB (ref 0–200)
HDL: 49 mg/dL (ref >40)
LDL CALC: 155 mg/dL — AB (ref 0–99)
Total CHOL/HDL Ratio: 4.7 RATIO
Triglycerides: 119 mg/dL (ref <150)
VLDL: 24 mg/dL (ref 0–40)

## 2016-11-17 LAB — ECHOCARDIOGRAM COMPLETE
HEIGHTINCHES: 61 in
Weight: 2312 oz

## 2016-11-17 LAB — TROPONIN I: Troponin I: <0.03 ng/mL (ref <0.03)

## 2016-11-17 LAB — SEDIMENTATION RATE: Sed Rate: 16 mm/hr (ref 0–30)

## 2016-11-17 LAB — TSH: TSH: 2.056 u[IU]/mL (ref 0.350–4.500)

## 2016-11-17 MED ORDER — BUTALBITAL-APAP-CAFFEINE 50-325-40 MG PO TABS
2.0000 | ORAL_TABLET | Freq: Once | ORAL | Status: AC
  Administered 2016-11-17: 2 via ORAL
  Filled 2016-11-17: qty 2

## 2016-11-17 MED ORDER — BUTALBITAL-APAP-CAFFEINE 50-325-40 MG PO TABS: 1.0000 | ORAL_TABLET | Freq: Four times a day (QID) | ORAL | 0 refills | Status: DC | PRN

## 2016-11-17 MED ORDER — ONDANSETRON HCL 4 MG/2ML IJ SOLN
4.0000 mg | Freq: Three times a day (TID) | INTRAMUSCULAR | Status: DC | PRN
  Administered 2016-11-17: 4 mg via INTRAVENOUS
  Filled 2016-11-17: qty 2

## 2016-11-17 MED ORDER — ASPIRIN 325 MG PO TABS
325.0000 mg | ORAL_TABLET | Freq: Every day | ORAL | Status: DC
  Administered 2016-11-17: 325 mg via ORAL
  Filled 2016-11-17: qty 1

## 2016-11-17 MED ORDER — DIAZEPAM 5 MG PO TABS
5.0000 mg | ORAL_TABLET | Freq: Two times a day (BID) | ORAL | Status: DC | PRN
  Administered 2016-11-17: 5 mg via ORAL
  Filled 2016-11-17: qty 1

## 2016-11-17 MED ORDER — STROKE: EARLY STAGES OF RECOVERY BOOK
Freq: Once | Status: AC
  Administered 2016-11-17: 02:00:00

## 2016-11-17 MED ORDER — ESTRADIOL 1 MG PO TABS
1.0000 mg | ORAL_TABLET | Freq: Every day | ORAL | Status: DC
  Administered 2016-11-17: 11:00:00 1 mg via ORAL
  Filled 2016-11-17: qty 1

## 2016-11-17 MED ORDER — ACETAMINOPHEN 160 MG/5ML PO SOLN: 650.0000 mg | ORAL | Status: DC | PRN

## 2016-11-17 MED ORDER — PROMETHAZINE HCL 25 MG/ML IJ SOLN: 12.5000 mg | Freq: Four times a day (QID) | INTRAMUSCULAR | Status: DC | PRN

## 2016-11-17 MED ORDER — VERAPAMIL HCL ER 120 MG PO TBCR: 120.0000 mg | EXTENDED_RELEASE_TABLET | Freq: Every day | ORAL | 0 refills | Status: DC

## 2016-11-17 MED ORDER — SODIUM CHLORIDE 0.9 % IV SOLN
INTRAVENOUS | Status: DC
  Administered 2016-11-17: 02:00:00 via INTRAVENOUS

## 2016-11-17 MED ORDER — ACETAMINOPHEN 650 MG RE SUPP: 650.0000 mg | RECTAL | Status: DC | PRN

## 2016-11-17 MED ORDER — FLUOXETINE HCL 20 MG PO CAPS
20.0000 mg | ORAL_CAPSULE | Freq: Every day | ORAL | Status: DC
  Administered 2016-11-17: 11:00:00 20 mg via ORAL
  Filled 2016-11-17: qty 1

## 2016-11-17 MED ORDER — VITAMIN B-12 1000 MCG PO TABS
1000.0000 ug | ORAL_TABLET | Freq: Every day | ORAL | Status: DC
  Administered 2016-11-17: 1000 ug via ORAL
  Filled 2016-11-17: qty 1

## 2016-11-17 MED ORDER — ENOXAPARIN SODIUM 40 MG/0.4ML ~~LOC~~ SOLN
40.0000 mg | SUBCUTANEOUS | Status: DC
  Administered 2016-11-17: 40 mg via SUBCUTANEOUS
  Filled 2016-11-17: qty 0.4

## 2016-11-17 MED ORDER — ASPIRIN 300 MG RE SUPP: 300.0000 mg | Freq: Every day | RECTAL | Status: DC

## 2016-11-17 MED ORDER — ASPIRIN EC 81 MG PO TBEC: 81.0000 mg | DELAYED_RELEASE_TABLET | Freq: Every day | ORAL | Status: DC

## 2016-11-17 MED ORDER — ACETAMINOPHEN 325 MG PO TABS: 650.0000 mg | ORAL_TABLET | ORAL | Status: DC | PRN

## 2016-11-17 MED ORDER — SENNOSIDES-DOCUSATE SODIUM 8.6-50 MG PO TABS: 1.0000 | ORAL_TABLET | Freq: Every evening | ORAL | Status: DC | PRN

## 2016-11-17 NOTE — Consult Note (Signed)
Referring Physician: Gouru    Chief Complaint: Right sided weakness  HPI: Lindsey Bautista is an 56 y.o. female who reports that she has had a severe left sided headache for the past 2 days.  On yesterday afternoon laid down for a nap and when she awakened reported feeling poorly.  Then complained of chest pain, back pain and neck pain.  Then developed right sided weakness and difficulty with speech.  Patient presented for evaluation at that time.  Patient has had 5 similar episodes in the past.  The first was in 2014 and the last was about a year ago.  Work ups have been unremarkable.  Initial NIHSS of 5.  Date last known well: Date: 11/16/2016 Time last known well: Time: 14:30 tPA Given: No: Outside time window  Past Medical History:  Diagnosis Date  . COPD (chronic obstructive pulmonary disease) ([place])   . Depression   . GERD (gastroesophageal reflux disease)   . Hypotension   . IBS (irritable bowel syndrome)   . Right arm weakness   . Situational syncope   . TIA (transient ischemic attack)     Past Surgical History:  Procedure Laterality Date  . COLONOSCOPY WITH PROPOFOL N/A 11/22/2015   Procedure: COLONOSCOPY WITH PROPOFOL;  Surgeon: Manya Silvas, MD;  Location: [facility] ENDOSCOPY;  Service: Endoscopy;  Laterality: N/A;  . ESOPHAGOGASTRODUODENOSCOPY (EGD) WITH PROPOFOL N/A 11/22/2015   Procedure: ESOPHAGOGASTRODUODENOSCOPY (EGD) WITH PROPOFOL;  Surgeon: Manya Silvas, MD;  Location: [facility] ENDOSCOPY;  Service: Endoscopy;  Laterality: N/A;  . TONSILLECTOMY    . VAGINAL HYSTERECTOMY      Family History  Problem Relation Age of Onset  . Heart disease    . COPD    . Stroke    . Breast cancer Paternal Aunt 70   Social History:  reports that she has been smoking Cigarettes.  She has a 25.00 pack-year smoking history. She has never used smokeless tobacco. She reports that she does not drink alcohol or use drugs.  Allergies:  Allergies  Allergen Reactions  . Penicillin G  Anaphylaxis and Other (See Comments)    Has patient had a PCN reaction causing immediate rash, facial/tongue/throat swelling, SOB or lightheadedness with hypotension: [id number]} Has patient had a PCN reaction causing severe rash involving mucus membranes or skin necrosis: no:[id number]} Has patient had a PCN reaction that required hospitalization no:[id number]} Has patient had a PCN reaction occurring within the last 10 years: [id number]} If all of the above answers are "NO", then may proceed with Cephalosporin use.   . Iodinated Diagnostic Agents Rash and Nausea And Vomiting    Medications:  I have reviewed the patient's current medications. Prior to Admission:  Prescriptions Prior to Admission  Medication Sig Dispense Refill Last Dose  . aspirin 81 MG tablet Take 81 mg by mouth daily.   11/16/2016 at Unknown time  . clonazePAM (KLONOPIN) 1 MG tablet Take 1 tablet (1 mg total) by mouth 2 (two) times daily. 60 tablet 2 11/16/2016 at Unknown time  . estradiol (ESTRACE) 1 MG tablet TAKE ONE TABLET BY MOUTH ONCE DAILY 30 tablet 5 11/16/2016 at Unknown time  . FLUoxetine (PROZAC) 20 MG capsule Take 1 capsule (20 mg total) by mouth daily. 30 capsule 12 11/16/2016 at Unknown time  . vitamin B-12 (CYANOCOBALAMIN) 1000 MCG tablet Take 1,000 mcg by mouth daily.   11/16/2016 at Unknown time  . albuterol (PROVENTIL HFA;VENTOLIN HFA) 108 (90 Base) MCG/ACT inhaler Inhale 2 puffs into the lungs every 6 (six)  hours as needed for wheezing or shortness of breath. (Patient not taking: Reported on 11/16/2016) 1 Inhaler 2 Not Taking at Unknown time  . atorvastatin (LIPITOR) 80 MG tablet Take 1 tablet (80 mg total) by mouth daily. (Patient not taking: Reported on 11/16/2016) 30 tablet 11 Not Taking at Unknown time   Scheduled: . aspirin EC  81 mg Oral Daily  . aspirin  300 mg Rectal Daily   Or  . aspirin  325 mg Oral Daily  . enoxaparin (LOVENOX) injection  40 mg Subcutaneous Q24H  . estradiol  1 mg Oral Daily  .  FLUoxetine  20 mg Oral Daily  . vitamin B-12  1,000 mcg Oral Daily    ROS: History obtained from the patient  General ROS: negative for - chills, fatigue, fever, night sweats, weight gain or weight loss Psychological ROS: negative for - behavioral disorder, hallucinations, memory difficulties, mood swings or suicidal ideation Ophthalmic ROS: negative for - blurry vision, double vision, eye pain or loss of vision ENT ROS: negative for - epistaxis, nasal discharge, oral lesions, sore throat, tinnitus or vertigo Allergy and Immunology ROS: negative for - hives or itchy/watery eyes Hematological and Lymphatic ROS: negative for - bleeding problems, bruising or swollen lymph nodes Endocrine ROS: negative for - galactorrhea, hair pattern changes, polydipsia/polyuria or temperature intolerance Respiratory ROS: negative for - cough, hemoptysis, shortness of breath or wheezing Cardiovascular ROS: as noted in HPI Gastrointestinal ROS: negative for - abdominal pain, diarrhea, hematemesis, nausea/vomiting or stool incontinence Genito-Urinary ROS: negative for - dysuria, hematuria, incontinence or urinary frequency/urgency Musculoskeletal ROS: as noted in HPI Neurological ROS: as noted in HPI Dermatological ROS: negative for rash and skin lesion changes  Physical Examination: Blood pressure 129/66, pulse 61, temperature 98.6 F (37 C), temperature source Oral, resp. rate 18, height 5\' 1"  (1.549 m), weight 65.5 kg (144 lb 8 oz), SpO2 97 %.  HEENT-  Normocephalic, no lesions, without obvious abnormality.  Normal external eye and conjunctiva.  Normal TM's bilaterally.  Normal auditory canals and external ears. Normal external nose, mucus membranes and septum.  Normal pharynx. Cardiovascular- S1, S2 normal, pulses palpable throughout   Lungs- chest clear, no wheezing, rales, normal symmetric air entry Abdomen- soft, non-tender; bowel sounds normal; no masses,  no organomegaly Extremities- no  edema Lymph-no adenopathy palpable Musculoskeletal-no joint tenderness, deformity or swelling Skin-warm and dry, no hyperpigmentation, vitiligo, or suspicious lesions  Neurological Examination   Mental Status: Alert, oriented, thought content appropriate.  Speech fluent without evidence of aphasia. Voice is soft and scratchy.  Able to follow 3 step commands without difficulty. Cranial Nerves: II: Discs flat bilaterally; Visual fields grossly normal, pupils equal, round, reactive to light and accommodation III,IV, VI: ptosis not present, extra-ocular motions intact bilaterally V,VII: smile symmetric, facial light touch sensation decreased on the right VIII: hearing normal bilaterally IX,X: gag reflex present XI: bilateral shoulder shrug XII: midline tongue extension Motor: Right : Upper extremity   5/5    Left:     Upper extremity   5/5  Lower extremity   5-/5     Lower extremity   5/5 Patient with give-way strength loss in both upper extremities when arms tested simultaneously.  No pronator drift noted.  Drinking coffee with cup in right hand on my entering the room with no difficulty. Sensory: Pinprick and light touch intact throughout, bilaterally Deep Tendon Reflexes: 2+ and symmetric throughout Plantars: Right: downgoing   Left: downgoing Cerebellar: Normal finger-to-nose and normal heel-to-shin testing bilaterally Gait:  not tested due to safety concerns    Laboratory Studies:  Basic Metabolic Panel:  Recent Labs Lab 11/16/16 1921  NA 136  K 3.8  CL 103  CO2 25  GLUCOSE 91  BUN 13  CREATININE 0.83  CALCIUM 9.3    Liver Function Tests: No results for input(s): AST, ALT, ALKPHOS, BILITOT, PROT, ALBUMIN in the last 168 hours. No results for input(s): LIPASE, AMYLASE in the last 168 hours. No results for input(s): AMMONIA in the last 168 hours.  CBC:  Recent Labs Lab 11/16/16 1921  WBC 8.1  NEUTROABS 3.9  HGB 13.6  HCT 39.9  MCV 87.4  PLT 252    Cardiac  Enzymes:  Recent Labs Lab 11/16/16 1921 11/17/16 0122 11/17/16 0645  TROPONINI <0.03 <0.03 <0.03    BNP: Invalid input(s): POCBNP  CBG: No results for input(s): GLUCAP in the last 168 hours.  Microbiology: Results for orders placed or performed during the hospital encounter of 12/13/15  MRSA PCR Screening     Status: None   Collection Time: 12/14/15  1:52 PM  Result Value Ref Range Status   MRSA by PCR NEGATIVE NEGATIVE Final    Comment:        The GeneXpert MRSA Assay (FDA approved for NASAL specimens only), is one component of a comprehensive MRSA colonization surveillance program. It is not intended to diagnose MRSA infection nor to guide or monitor treatment for MRSA infections.     Coagulation Studies: No results for input(s): LABPROT, INR in the last 72 hours.  Urinalysis:  Recent Labs Lab 11/16/16 2130  COLORURINE STRAW*  LABSPEC 1.009  PHURINE 7.0  GLUCOSEU NEGATIVE  HGBUR NEGATIVE  BILIRUBINUR NEGATIVE  KETONESUR NEGATIVE  PROTEINUR NEGATIVE  NITRITE NEGATIVE  LEUKOCYTESUR NEGATIVE    Lipid Panel:    Component Value Date/Time   CHOL 228 (H) 11/17/2016 0645   CHOL 139 08/31/2016 1424   CHOL 203 (H) 11/28/2013 0424   TRIG 119 11/17/2016 0645   TRIG 71 08/31/2016 1424   TRIG 140 11/28/2013 0424   HDL 49 11/17/2016 0645   HDL 62 06/21/2015 1348   HDL 44 11/28/2013 0424   CHOLHDL 4.7 11/17/2016 0645   VLDL 24 11/17/2016 0645   VLDL 14 08/31/2016 1424   VLDL 28 11/28/2013 0424   LDLCALC 155 (H) 11/17/2016 0645   LDLCALC 169 (H) 06/21/2015 1348   LDLCALC 131 (H) 11/28/2013 0424    HgbA1C:  Lab Results  Component Value Date   HGBA1C 5.4 12/14/2015    Urine Drug Screen:     Component Value Date/Time   LABOPIA NONE DETECTED 11/16/2016 2130   LABOPIA NONE DETECTED 12/13/2015 2116   COCAINSCRNUR NONE DETECTED 11/16/2016 2130   LABBENZ NONE DETECTED 11/16/2016 2130   LABBENZ NONE DETECTED 12/13/2015 2116   AMPHETMU NONE DETECTED  11/16/2016 2130   AMPHETMU NONE DETECTED 12/13/2015 2116   THCU NONE DETECTED 11/16/2016 2130   THCU NONE DETECTED 12/13/2015 2116   LABBARB NONE DETECTED 11/16/2016 2130   LABBARB NONE DETECTED 12/13/2015 2116    Alcohol Level:  Recent Labs Lab 11/16/16 2130  ETH <5    Other results: EKG: sinus rhythm at 57 bpm.  Imaging: Ct Head Wo Contrast  Result Date: 11/16/2016 CLINICAL DATA:  Right-sided weakness EXAM: CT HEAD WITHOUT CONTRAST TECHNIQUE: Contiguous axial images were obtained from the base of the skull through the vertex without intravenous contrast. COMPARISON:  12/13/2015 MRI and CT of the head FINDINGS: BRAIN: The ventricles and sulci are  normal. No intraparenchymal hemorrhage, mass effect nor midline shift. No acute large vascular territory infarcts. Grey-white matter distinction is maintained. The basal ganglia are unremarkable. No abnormal extra-axial fluid collections. Basal cisterns are not effaced and midline. The brainstem and cerebellar hemispheres are without acute abnormalities. VASCULAR: Unremarkable. SKULL/SOFT TISSUES: No skull fracture. No significant soft tissue swelling. ORBITS/SINUSES: The included ocular globes and orbital contents are normal.The mastoid air cells are clear. The included paranasal sinuses are well-aerated. OTHER: None. IMPRESSION: No acute intracranial abnormality Electronically Signed   By: Ashley Royalty M.D.   On: 11/16/2016 20:01   Mr Brain Wo Contrast  Result Date: 11/17/2016 CLINICAL DATA:  Right-sided weakness EXAM: MRI HEAD WITHOUT CONTRAST MRA HEAD WITHOUT CONTRAST TECHNIQUE: Multiplanar, multiecho pulse sequences of the brain and surrounding structures were obtained without intravenous contrast. Angiographic images of the head were obtained using MRA technique without contrast. COMPARISON:  CT head 11/16/2016.  MRI head 12/13/2015 FINDINGS: MRI HEAD FINDINGS Brain: Negative for acute infarct. Ventricle size and cerebral volume normal. Mild  periventricular white matter hyperintensity. No focal white matter lesions. Negative for hemorrhage, mass, or fluid collection. No shift of the midline structures. Vascular: Normal arterial flow void Skull and upper cervical spine: Negative Sinuses/Orbits: Negative Other: None MRA HEAD FINDINGS Right vertebral artery patent to the basilar without stenosis. Right PICA patent. Hypoplastic left vertebral artery with minimal contribution to the basilar. Basilar is widely patent. Superior cerebellar arteries patent bilaterally. Posterior cerebral arteries patent bilaterally without stenosis. Fetal origin of the left posterior cerebral artery. Cavernous carotid widely patent bilaterally. Anterior and middle cerebral arteries patent without stenosis. Negative for aneurysm. IMPRESSION: No acute intracranial abnormality. Negative MRA head Electronically Signed   By: Franchot Gallo M.D.   On: 11/17/2016 10:04   US Carotid Bilateral (at [facility] And [facility] Only)  Result Date: 11/17/2016 CLINICAL DATA:  Right-sided weakness. EXAM: BILATERAL CAROTID DUPLEX ULTRASOUND TECHNIQUE: Pearline Cables scale imaging, color Doppler and duplex ultrasound were performed of bilateral carotid and vertebral arteries in the neck. COMPARISON:  CT 11/16/2016. MR 12/13/2015. Carotid ultrasound 11/27/2013. FINDINGS: Criteria: Quantification of carotid stenosis is based on velocity parameters that correlate the residual internal carotid diameter with NASCET-based stenosis levels, using the diameter of the distal internal carotid lumen as the denominator for stenosis measurement. The following velocity measurements were obtained: RIGHT ICA:  124/43 cm/sec CCA:  60/63 cm/sec SYSTOLIC ICA/CCA RATIO:  1.5 DIASTOLIC ICA/CCA RATIO:  1.7 ECA:  124 cm/sec LEFT ICA:  110/38 cm/sec CCA:  016/01 cm/sec SYSTOLIC ICA/CCA RATIO:  1.2 DIASTOLIC ICA/CCA RATIO:  1.2 ECA:  95 cm/sec RIGHT CAROTID ARTERY: Mild diffuse intimal thickening . No significant atherosclerotic vascular  disease. RIGHT VERTEBRAL ARTERY:  Patent with antegrade flow. LEFT CAROTID ARTERY: Mild diffuse intimal thickening. No significant atherosclerotic vascular disease. LEFT VERTEBRAL ARTERY:  Patent with antegrade flow. IMPRESSION: 1. Mild diffuse intimal thickening. No significant carotid atherosclerotic vascular disease . No significant change from prior exam. 2. Vertebral arteries are patent with antegrade flow. Electronically Signed   By: Marcello Moores  Register   On: 11/17/2016 09:26   Dg Chest Portable 1 View  Result Date: 11/16/2016 CLINICAL DATA:  Right-sided weakness EXAM: PORTABLE CHEST 1 VIEW COMPARISON:  None. FINDINGS: The heart size and mediastinal contours are within normal limits. Aortic atherosclerosis without aneurysm. Both lungs are clear. The visualized skeletal structures are nonacute. Probable small bone island of the left humeral head. IMPRESSION: No active disease.  Aortic atherosclerosis. Electronically Signed   By: Meredith Leeds.D.  On: 11/16/2016 20:00   Mr Jodene Nam Head/brain EN Cm  Result Date: 11/17/2016 CLINICAL DATA:  Right-sided weakness EXAM: MRI HEAD WITHOUT CONTRAST MRA HEAD WITHOUT CONTRAST TECHNIQUE: Multiplanar, multiecho pulse sequences of the brain and surrounding structures were obtained without intravenous contrast. Angiographic images of the head were obtained using MRA technique without contrast. COMPARISON:  CT head 11/16/2016.  MRI head 12/13/2015 FINDINGS: MRI HEAD FINDINGS Brain: Negative for acute infarct. Ventricle size and cerebral volume normal. Mild periventricular white matter hyperintensity. No focal white matter lesions. Negative for hemorrhage, mass, or fluid collection. No shift of the midline structures. Vascular: Normal arterial flow void Skull and upper cervical spine: Negative Sinuses/Orbits: Negative Other: None MRA HEAD FINDINGS Right vertebral artery patent to the basilar without stenosis. Right PICA patent. Hypoplastic left vertebral artery with minimal  contribution to the basilar. Basilar is widely patent. Superior cerebellar arteries patent bilaterally. Posterior cerebral arteries patent bilaterally without stenosis. Fetal origin of the left posterior cerebral artery. Cavernous carotid widely patent bilaterally. Anterior and middle cerebral arteries patent without stenosis. Negative for aneurysm. IMPRESSION: No acute intracranial abnormality. Negative MRA head Electronically Signed   By: Franchot Gallo M.D.   On: 11/17/2016 10:04    Assessment: 56 y.o. female presenting with complaints of headache, chest pain, and right hemiparesis.  Work ups in the past have been unrevealing.  MRI of the brain performed today anda reviewed.  There are no acute changes.  MRA is unremarkable.  Carotid dopplers show no evidence of hemodynamically significant stenosis.  A1c pending, LDL 155.  Patient on ASA at home.  Family reports that these symptoms may last for about a week.  This makes TIA less likely.  Can not rule out the possibility of a complicated migraine.    Stroke Risk Factors - hypertension and smoking  Plan: 1. HgbA1c pending 2. EEG 3. PT consult, OT consult, Speech consult 4. Echocardiogram pending 5. Smoking cessation counseling 6. Prophylactic therapy-Continue ASA 7. Telemetry monitoring 8. Frequent neuro checks 9. If stroke work up unremarkable would consider Verapamil 120mg  qhs for headache prophylaxis.     Alexis Goodell, MD Neurology [phone] 11/17/2016, 10:55 AM

## 2016-11-17 NOTE — Discharge Instructions (Signed)
Follow-up with primary care physician in a week F/U WITH neuro in a month

## 2016-11-17 NOTE — Progress Notes (Signed)
OT Cancellation Note  Patient Details Name: Lindsey Bautista MRN: [id number] DOB: [birth year]   Cancelled Treatment:    Reason Eval/Treat Not Completed: Patient at procedure or test/ unavailable. Eval attempted x2. Pt. at testing when attempted in a.m, and p.m.  Harrel Carina, MS, OTR/L 11/17/2016, 1:43 PM

## 2016-11-17 NOTE — Care Management Obs Status (Signed)
[place] NOTIFICATION   Patient Details  Name: FLOREEN TEEGARDEN MRN: [id number] Date of Birth: 09/19/1960   Medicare Observation Status Notification Given:  Yes    Jolly Mango, RN 11/17/2016, 11:59 AM

## 2016-11-17 NOTE — Progress Notes (Signed)
SLP Cancellation Note  Patient Details Name: Lindsey Bautista MRN: [id number] DOB: [birth year]   Cancelled treatment:       Reason Eval/Treat Not Completed: SLP screened, no needs identified, will sign off (Consulted NSG, reviewed chart) Pt denied any trouble with language/cognition. Pt did note being hoarse, that started this morning and has progressed throughout the day. Pt reported this happening often. SLP educated on drinking plenty of water and vocal rest, f/u with doctor if needed. Pt denied any additional concerns with speech/language. No further skilled ST services indicated, NSG to reconsult if change in status. NSG updated.  Addendum: noted negative MRA, no acute intracranial abnormality  Eulogio Ditch, B.S Graduate Clinician  11/17/2016, 11:33 AM   This information has been reviewed and agreed upon by this supervising clinician.  Orinda Kenner, [place], CCC-SLP

## 2016-11-17 NOTE — Evaluation (Signed)
Physical Therapy Evaluation Patient Details Name: Lindsey Bautista MRN: [id number] DOB: 14-Apr-1961 Today's Date: 11/17/2016   History of Present Illness  56 y/o female here with R sided weakness.  She has had multiple similar episodes generally w/o + imaging or long term effect (d/c'd conversion disorder last visit).  Clinical Impression  Pt is able to ambulate around nurses station and negotiate up/down steps safely and w/o direct assist.  She had some hesitancy and guarding with ambulation and overall did well. She did have some minimal R sided weakness and decreased quality of motion but was safe and should be able to return home w/o PT intervention.      Follow Up Recommendations No PT follow up    Equipment Recommendations       Recommendations for Other Services       Precautions / Restrictions Precautions Precautions: Fall Restrictions Weight Bearing Restrictions: No      Mobility  Bed Mobility Overal bed mobility: Independent             General bed mobility comments: Pt able to get herself to EOB w/o assist  Transfers Overall transfer level: Independent Equipment used: None             General transfer comment: Pt able to rise to standing w/ good safety and confidence  Ambulation/Gait Ambulation/Gait assistance: Supervision Ambulation Distance (Feet): 250 Feet Assistive device: None       General Gait Details: Pt did well with prolonged ambulation and though she had soem mild hesitancy she had no safety issues and generally reports being near her baseline  Stairs Stairs: Yes Stairs assistance: Supervision Stair Management: Two rails Number of Stairs: 5 General stair comments: Pt is able to negotiate up/down steps w/o assist - shows good safety  Wheelchair Mobility    Modified Rankin (Stroke Patients Only)       Balance Overall balance assessment: Modified Independent                                            Pertinent Vitals/Pain Pain Assessment:  (does report some head ache)    Home Living Family/patient expects to be discharged to:: Private residence Living Arrangements: Spouse/significant other Available Help at Discharge: Family;Available PRN/intermittently Type of Home: House Home Access: Stairs to enter Entrance Stairs-Rails: Can reach both Entrance Stairs-Number of Steps: 5   Home Equipment: Walker - 2 wheels;Bedside commode;Shower seat      Prior Function Level of Independence: Independent         Comments: daughter reports that she is only rarely out of the house and generally is not very active     Hand Dominance        Extremity/Trunk Assessment   Upper Extremity Assessment Upper Extremity Assessment: Generalized weakness;Overall [facility] for tasks assessed (R UE grossly weaker than L, decreased quality of motion)    Lower Extremity Assessment Lower Extremity Assessment: Generalized weakness;Overall WFL for tasks assessed (L LE grossly 4/5, R LE grossly 4-/5)       Communication   Communication: No difficulties  Cognition Arousal/Alertness: Awake/alert Behavior During Therapy: WFL for tasks assessed/performed Overall Cognitive Status: Within Functional Limits for tasks assessed  General Comments      Exercises     Assessment/Plan    PT Assessment Patent does not need any further PT services  PT Problem List         PT Treatment Interventions      PT Goals (Current goals can be found in the Care Plan section)  Acute Rehab PT Goals Patient Stated Goal: go home PT Goal Formulation: With patient/family Time For Goal Achievement: 12/01/16 Potential to Achieve Goals: Good    Frequency     Barriers to discharge        Co-evaluation               End of Session Equipment Utilized During Treatment: Gait belt Activity Tolerance: Patient tolerated treatment well Patient left: with  family/visitor present;with chair alarm set;with call bell/phone within reach Nurse Communication: Mobility status PT Visit Diagnosis: Muscle weakness (generalized) (M62.81);Difficulty in walking, not elsewhere classified (R26.2)    Time: 8250-0370 PT Time Calculation (min) (ACUTE ONLY): 16 min   Charges:   PT Evaluation $PT Eval Low Complexity: 1 Procedure     PT G Codes:   PT G-Codes **NOT FOR INPATIENT CLASS** Functional Assessment Tool Used: AM-PAC 6 Clicks Basic Mobility Functional Limitation: Mobility: Walking and moving around Mobility: Walking and Moving Around Current Status (W8889): 0 percent impaired, limited or restricted Mobility: Walking and Moving Around Goal Status (V6945): 0 percent impaired, limited or restricted Mobility: Walking and Moving Around Discharge Status (W3888): 0 percent impaired, limited or restricted     Kreg Shropshire, DPT 11/17/2016, 10:41 AM

## 2016-11-17 NOTE — Discharge Summary (Addendum)
[facility] NAME: Lindsey Bautista    MR#:  [id number]  DATE OF BIRTH:  05-May-1961  DATE OF ADMISSION:  11/16/2016 ADMITTING PHYSICIAN: Harvie Bridge, DO  DATE OF DISCHARGE: 11/17/16 PRIMARY CARE PHYSICIAN: Golden Pop, MD    ADMISSION DIAGNOSIS:  Cerebrovascular accident (CVA), unspecified mechanism ([place]) [I63.9]  DISCHARGE DIAGNOSIS:  Active Problems:   Right sided weakness Complicated migraine  SECONDARY DIAGNOSIS:   Past Medical History:  Diagnosis Date  . COPD (chronic obstructive pulmonary disease) ([place])   . Depression   . GERD (gastroesophageal reflux disease)   . Hypotension   . IBS (irritable bowel syndrome)   . Right arm weakness   . Situational syncope   . TIA (transient ischemic attack)     HOSPITAL COURSE:   HISTORY OF PRESENT ILLNESS: Lindsey Bautista is a 56 y.o. female with a known history of COPD, GERD, hypotension, IBS, TIA, situational syncope, Hyperlipidemia, peripheral vascular disease, seizures, anxiety, depression  was in a usual state of health until 5:30 PM when she developed chest pain associated with speech disturbance and right sided weakness. Patient has been complaining of intermittent chest pain described as central,Radiating through to her back and associated with headache, muscular neck pain, nausea. She reports the following the chest pain episode today she developed speech disturbance and right-sided weakness which persisted through until she got to the emergency department. She reports associated left temporal headache. Patient's daughter states that she has had multiple episodes similar to this in the past and the workup as always been negative. Only new stressors the recent death of a cousin  In 07-Dec-2015 she was hospitalized for symptoms of weakness and had an extensive negative neurologic workup. Ultimately she had a discharge diagnosis of conversion disorder per neurology notes.  She was seen again in our emergency department with similar symptoms and was discharged home.   Otherwise there has been no change in status. Patient has been taking medication as prescribed and there has been no recent change in medication or diet.  There has been no recent illness, travel or sick contacts.    Patient denies fevers/chills, weakness, dizziness,  shortness of breath, N/V/C/D, abdominal pain, dysuria/frequency, changes in mental status.    Hospital course  1. Right-sided weakness and dysarthria, ruled out TIA/CVA -Etiology could be from complicated migraine -Appreciate neurology recommendations -Continue aspirin for prophylaxis -Verapamil 120 mg daily at bedtime is added to the regimen for headache prophylaxis -Ruled out TIA and CVA - Studies: MRA/MRI, Echo, Carotids-normal -EEG normal -LDL 155-TSH normal - fioricet prn   2. Chest pain -Monitored on telemetry, no dysrhythmias -Acute MI ruled out with 3 negative troponins  3. Temporal headache -nml ESR, temporal arteritis ruled out    4. History of depression -Continue Prozac    DISCHARGE CONDITIONS:   Stable  CONSULTS OBTAINED:  Treatment Team:  Alexis Goodell, MD   PROCEDURES EEG normal  DRUG ALLERGIES:   Allergies  Allergen Reactions  . Penicillin G Anaphylaxis and Other (See Comments)    Has patient had a PCN reaction causing immediate rash, facial/tongue/throat swelling, SOB or lightheadedness with hypotension: [id number]} Has patient had a PCN reaction causing severe rash involving mucus membranes or skin necrosis: no:[id number]} Has patient had a PCN reaction that required hospitalization no:[id number]} Has patient had a PCN reaction occurring within the last 10 years: [id number]} If all of the above answers are "NO", then may proceed with Cephalosporin use.   [name]  Iodinated Diagnostic Agents Rash and Nausea And Vomiting    DISCHARGE MEDICATIONS:   Current Discharge Medication List     START taking these medications   Details  butalbital-acetaminophen-caffeine (FIORICET, ESGIC) 50-325-40 MG tablet Take 1-2 tablets by mouth every 6 (six) hours as needed for headache. Qty: 30 tablet, Refills: 0    verapamil (CALAN-SR) 120 MG CR tablet Take 1 tablet (120 mg total) by mouth at bedtime. Qty: 30 tablet, Refills: 0      CONTINUE these medications which have NOT CHANGED   Details  aspirin 81 MG tablet Take 81 mg by mouth daily.    clonazePAM (KLONOPIN) 1 MG tablet Take 1 tablet (1 mg total) by mouth 2 (two) times daily. Qty: 60 tablet, Refills: 2    estradiol (ESTRACE) 1 MG tablet TAKE ONE TABLET BY MOUTH ONCE DAILY Qty: 30 tablet, Refills: 5    FLUoxetine (PROZAC) 20 MG capsule Take 1 capsule (20 mg total) by mouth daily. Qty: 30 capsule, Refills: 12    vitamin B-12 (CYANOCOBALAMIN) 1000 MCG tablet Take 1,000 mcg by mouth daily.    albuterol (PROVENTIL HFA;VENTOLIN HFA) 108 (90 Base) MCG/ACT inhaler Inhale 2 puffs into the lungs every 6 (six) hours as needed for wheezing or shortness of breath. Qty: 1 Inhaler, Refills: 2    atorvastatin (LIPITOR) 80 MG tablet Take 1 tablet (80 mg total) by mouth daily. Qty: 30 tablet, Refills: 11         DISCHARGE INSTRUCTIONS:   Follow-up with primary care physician in a week F/U WITH neuro in a month  DIET:  Low fat, Low cholesterol diet  DISCHARGE CONDITION:  Stable  ACTIVITY:  Activity as tolerated  OXYGEN:  Home Oxygen: No.   Oxygen Delivery: room air  DISCHARGE LOCATION:  home   If you experience worsening of your admission symptoms, develop shortness of breath, life threatening emergency, suicidal or homicidal thoughts you must seek medical attention immediately by calling 911 or calling your MD immediately  if symptoms less severe.  You Must read complete instructions/literature along with all the possible adverse reactions/side effects for all the Medicines you take and that have been prescribed to  you. Take any new Medicines after you have completely understood and accpet all the possible adverse reactions/side effects.   Please note  You were cared for by a hospitalist during your hospital stay. If you have any questions about your discharge medications or the care you received while you were in the hospital after you are discharged, you can call the unit and asked to speak with the hospitalist on call if the hospitalist that took care of you is not available. Once you are discharged, your primary care physician will handle any further medical issues. Please note that NO REFILLS for any discharge medications will be authorized once you are discharged, as it is imperative that you return to your primary care physician (or establish a relationship with a primary care physician if you do not have one) for your aftercare needs so that they can reassess your need for medications and monitor your lab values.     Today  Chief Complaint  Patient presents with  . Chest Pain   Patient's clinical situation improved. Headache is better  ROS:  CONSTITUTIONAL: Denies fevers, chills. Denies any fatigue, weakness.  EYES: Denies blurry vision, double vision, eye pain. EARS, NOSE, THROAT: Denies tinnitus, ear pain, hearing loss. RESPIRATORY: Denies cough, wheeze, shortness of breath.  CARDIOVASCULAR: Denies chest pain, palpitations, edema.  GASTROINTESTINAL: Denies nausea, vomiting, diarrhea, abdominal pain. Denies bright red blood per rectum. GENITOURINARY: Denies dysuria, hematuria. ENDOCRINE: Denies nocturia or thyroid problems. HEMATOLOGIC AND LYMPHATIC: Denies easy bruising or bleeding. SKIN: Denies rash or lesion. MUSCULOSKELETAL: Denies pain in neck, back, shoulder, knees, hips or arthritic symptoms.  NEUROLOGIC: Denies paralysis, paresthesias.  PSYCHIATRIC: Denies anxiety or depressive symptoms.   VITAL SIGNS:  Blood pressure 126/68, pulse (!) 52, temperature 98.7 F (37.1 C), resp.  rate 18, height '5\' 1"'$  (1.549 m), weight 65.5 kg (144 lb 8 oz), SpO2 99 %.  I/O:    Intake/Output Summary (Last 24 hours) at 11/17/16 1634 Last data filed at 11/17/16 1340  Gross per 24 hour  Intake            352.5 ml  Output                0 ml  Net            352.5 ml    PHYSICAL EXAMINATION:  GENERAL:  56 y.o.-year-old patient lying in the bed with no acute distress.  EYES: Pupils equal, round, reactive to light and accommodation. No scleral icterus. Extraocular muscles intact.  HEENT: Head atraumatic, normocephalic. Oropharynx and nasopharynx clear.  NECK:  Supple, no jugular venous distention. No thyroid enlargement, no tenderness.  LUNGS: Normal breath sounds bilaterally, no wheezing, rales,rhonchi or crepitation. No use of accessory muscles of respiration.  CARDIOVASCULAR: S1, S2 normal. No murmurs, rubs, or gallops.  ABDOMEN: Soft, non-tender, non-distended. Bowel sounds present. No organomegaly or mass.  EXTREMITIES: No pedal edema, cyanosis, or clubbing.  NEUROLOGIC: Cranial nerves II through XII are intact. Muscle strength 5/5 in all extremities except RUE 3/5. Sensation intact. Gait not checked.  PSYCHIATRIC: The patient is alert and oriented x 3.  SKIN: No obvious rash, lesion, or ulcer.   DATA REVIEW:   CBC  Recent Labs Lab 11/16/16 1921  WBC 8.1  HGB 13.6  HCT 39.9  PLT 252    Chemistries   Recent Labs Lab 11/16/16 1921  NA 136  K 3.8  CL 103  CO2 25  GLUCOSE 91  BUN 13  CREATININE 0.83  CALCIUM 9.3    Cardiac Enzymes  Recent Labs Lab 11/17/16 1441  [place] <0.03    Microbiology Results  Results for orders placed or performed during the hospital encounter of 12/13/15  MRSA PCR Screening     Status: None   Collection Time: 12/14/15  1:52 PM  Result Value Ref Range Status   MRSA by PCR NEGATIVE NEGATIVE Final    Comment:        The GeneXpert MRSA Assay (FDA approved for NASAL specimens only), is one component of a comprehensive  MRSA colonization surveillance program. It is not intended to diagnose MRSA infection nor to guide or monitor treatment for MRSA infections.     RADIOLOGY:  Ct Head Wo Contrast  Result Date: 11/16/2016 CLINICAL DATA:  Right-sided weakness EXAM: CT HEAD WITHOUT CONTRAST TECHNIQUE: Contiguous axial images were obtained from the base of the skull through the vertex without intravenous contrast. COMPARISON:  12/13/2015 MRI and CT of the head FINDINGS: BRAIN: The ventricles and sulci are normal. No intraparenchymal hemorrhage, mass effect nor midline shift. No acute large vascular territory infarcts. Grey-white matter distinction is maintained. The basal ganglia are unremarkable. No abnormal extra-axial fluid collections. Basal cisterns are not effaced and midline. The brainstem and cerebellar hemispheres are without acute abnormalities. VASCULAR: Unremarkable. SKULL/SOFT TISSUES: No skull fracture. No significant  soft tissue swelling. ORBITS/SINUSES: The included ocular globes and orbital contents are normal.The mastoid air cells are clear. The included paranasal sinuses are well-aerated. OTHER: None. IMPRESSION: No acute intracranial abnormality Electronically Signed   By: Ashley Royalty M.D.   On: 11/16/2016 20:01   Mr Brain Wo Contrast  Result Date: 11/17/2016 CLINICAL DATA:  Right-sided weakness EXAM: MRI HEAD WITHOUT CONTRAST MRA HEAD WITHOUT CONTRAST TECHNIQUE: Multiplanar, multiecho pulse sequences of the brain and surrounding structures were obtained without intravenous contrast. Angiographic images of the head were obtained using MRA technique without contrast. COMPARISON:  CT head 11/16/2016.  MRI head 12/13/2015 FINDINGS: MRI HEAD FINDINGS Brain: Negative for acute infarct. Ventricle size and cerebral volume normal. Mild periventricular white matter hyperintensity. No focal white matter lesions. Negative for hemorrhage, mass, or fluid collection. No shift of the midline structures. Vascular:  Normal arterial flow void Skull and upper cervical spine: Negative Sinuses/Orbits: Negative Other: None MRA HEAD FINDINGS Right vertebral artery patent to the basilar without stenosis. Right PICA patent. Hypoplastic left vertebral artery with minimal contribution to the basilar. Basilar is widely patent. Superior cerebellar arteries patent bilaterally. Posterior cerebral arteries patent bilaterally without stenosis. Fetal origin of the left posterior cerebral artery. Cavernous carotid widely patent bilaterally. Anterior and middle cerebral arteries patent without stenosis. Negative for aneurysm. IMPRESSION: No acute intracranial abnormality. Negative MRA head Electronically Signed   By: Franchot Gallo M.D.   On: 11/17/2016 10:04   US Carotid Bilateral (at [facility] Only)  Result Date: 11/17/2016 CLINICAL DATA:  Right-sided weakness. EXAM: BILATERAL CAROTID DUPLEX ULTRASOUND TECHNIQUE: Pearline Cables scale imaging, color Doppler and duplex ultrasound were performed of bilateral carotid and vertebral arteries in the neck. COMPARISON:  CT 11/16/2016. MR 12/13/2015. Carotid ultrasound 11/27/2013. FINDINGS: Criteria: Quantification of carotid stenosis is based on velocity parameters that correlate the residual internal carotid diameter with NASCET-based stenosis levels, using the diameter of the distal internal carotid lumen as the denominator for stenosis measurement. The following velocity measurements were obtained: RIGHT ICA:  124/43 cm/sec CCA:  09/81 cm/sec SYSTOLIC ICA/CCA RATIO:  1.5 DIASTOLIC ICA/CCA RATIO:  1.7 ECA:  124 cm/sec LEFT ICA:  110/38 cm/sec CCA:  191/47 cm/sec SYSTOLIC ICA/CCA RATIO:  1.2 DIASTOLIC ICA/CCA RATIO:  1.2 ECA:  95 cm/sec RIGHT CAROTID ARTERY: Mild diffuse intimal thickening . No significant atherosclerotic vascular disease. RIGHT VERTEBRAL ARTERY:  Patent with antegrade flow. LEFT CAROTID ARTERY: Mild diffuse intimal thickening. No significant atherosclerotic vascular disease. LEFT  VERTEBRAL ARTERY:  Patent with antegrade flow. IMPRESSION: 1. Mild diffuse intimal thickening. No significant carotid atherosclerotic vascular disease . No significant change from prior exam. 2. Vertebral arteries are patent with antegrade flow. Electronically Signed   By: Marcello Moores  Register   On: 11/17/2016 09:26   Dg Chest Portable 1 View  Result Date: 11/16/2016 CLINICAL DATA:  Right-sided weakness EXAM: PORTABLE CHEST 1 VIEW COMPARISON:  None. FINDINGS: The heart size and mediastinal contours are within normal limits. Aortic atherosclerosis without aneurysm. Both lungs are clear. The visualized skeletal structures are nonacute. Probable small bone island of the left humeral head. IMPRESSION: No active disease.  Aortic atherosclerosis. Electronically Signed   By: Ashley Royalty M.D.   On: 11/16/2016 20:00   Mr Jodene Nam Head/brain WG Cm  Result Date: 11/17/2016 CLINICAL DATA:  Right-sided weakness EXAM: MRI HEAD WITHOUT CONTRAST MRA HEAD WITHOUT CONTRAST TECHNIQUE: Multiplanar, multiecho pulse sequences of the brain and surrounding structures were obtained without intravenous contrast. Angiographic images of the head were obtained using MRA  technique without contrast. COMPARISON:  CT head 11/16/2016.  MRI head 12/13/2015 FINDINGS: MRI HEAD FINDINGS Brain: Negative for acute infarct. Ventricle size and cerebral volume normal. Mild periventricular white matter hyperintensity. No focal white matter lesions. Negative for hemorrhage, mass, or fluid collection. No shift of the midline structures. Vascular: Normal arterial flow void Skull and upper cervical spine: Negative Sinuses/Orbits: Negative Other: None MRA HEAD FINDINGS Right vertebral artery patent to the basilar without stenosis. Right PICA patent. Hypoplastic left vertebral artery with minimal contribution to the basilar. Basilar is widely patent. Superior cerebellar arteries patent bilaterally. Posterior cerebral arteries patent bilaterally without stenosis.  Fetal origin of the left posterior cerebral artery. Cavernous carotid widely patent bilaterally. Anterior and middle cerebral arteries patent without stenosis. Negative for aneurysm. IMPRESSION: No acute intracranial abnormality. Negative MRA head Electronically Signed   By: Franchot Gallo M.D.   On: 11/17/2016 10:04    EKG:   Orders placed or performed during the hospital encounter of 11/16/16  . EKG 12-Lead  . EKG 12-Lead  . ED EKG  . ED EKG      Management plans discussed with the patient, family and they are in agreement.  CODE STATUS:     Code Status Orders        Start     Ordered   11/17/16 0107  Full code  Continuous     11/17/16 0106    Code Status History    Date Active Date Inactive Code Status Order ID Comments User Context   12/13/2015 10:13 PM 12/15/2015  4:26 PM Full Code [id number]  Ivor Costa, MD ED      TOTAL TIME TAKING CARE OF THIS PATIENT: 43 minutes.   Note: This dictation was prepared with Dragon dictation along with smaller phrase technology. Any transcriptional errors that result from this process are unintentional.   '@MEC'$ @  on 11/17/2016 at 4:34 PM  Between 7am to 6pm - Pager - [phone]  After 6pm go to [email or web address] - password [email or web address] [facility]  Office  [phone]  CC: Primary care physician; Golden Pop, MD

## 2016-11-17 NOTE — Progress Notes (Signed)
*  PRELIMINARY RESULTS* Echocardiogram 2D Echocardiogram has been performed.  Lindsey Bautista 11/17/2016, 12:39 PM

## 2016-11-17 NOTE — Progress Notes (Signed)
Pt was discharged today, discharge instructions were reviewed with her and she verified understanding. Iv x1 removed. All belongings packed and returned to patient. 2 paper prescriptions were given to her. She was rolled out in wheelchair by staff.

## 2016-11-18 LAB — HEMOGLOBIN A1C
Hgb A1c MFr Bld: 5.3 % (ref 4.8–5.6)
Mean Plasma Glucose: 105 mg/dL

## 2016-11-24 ENCOUNTER — Inpatient Hospital Stay: Payer: Medicare Other | Admitting: Family Medicine

## 2016-11-29 DIAGNOSIS — E279 Disorder of adrenal gland, unspecified: Secondary | ICD-10-CM | POA: Diagnosis not present

## 2016-12-04 ENCOUNTER — Encounter: Payer: Self-pay | Admitting: Family Medicine

## 2016-12-04 ENCOUNTER — Ambulatory Visit (INDEPENDENT_AMBULATORY_CARE_PROVIDER_SITE_OTHER): Payer: Medicare Other | Admitting: Family Medicine

## 2016-12-04 DIAGNOSIS — R531 Weakness: Secondary | ICD-10-CM | POA: Diagnosis not present

## 2016-12-04 DIAGNOSIS — R55 Syncope and collapse: Secondary | ICD-10-CM

## 2016-12-04 DIAGNOSIS — G459 Transient cerebral ischemic attack, unspecified: Secondary | ICD-10-CM | POA: Insufficient documentation

## 2016-12-04 DIAGNOSIS — I639 Cerebral infarction, unspecified: Secondary | ICD-10-CM

## 2016-12-04 DIAGNOSIS — F447 Conversion disorder with mixed symptom presentation: Secondary | ICD-10-CM | POA: Diagnosis not present

## 2016-12-04 MED ORDER — CLONAZEPAM 1 MG PO TABS: 1.0000 mg | ORAL_TABLET | Freq: Two times a day (BID) | ORAL | 2 refills | Status: DC

## 2016-12-04 MED ORDER — ESTRADIOL 1 MG PO TABS: 1.0000 mg | ORAL_TABLET | Freq: Every day | ORAL | 5 refills | Status: DC

## 2016-12-04 MED ORDER — NITROGLYCERIN 0.4 MG SL SUBL: 0.4000 mg | SUBLINGUAL_TABLET | SUBLINGUAL | 3 refills | Status: DC | PRN

## 2016-12-04 NOTE — Progress Notes (Signed)
BP 125/76   Pulse 74   Ht 5\' 1"  (1.549 m)   Wt 140 lb (63.5 kg)   SpO2 98%   BMI 26.45 kg/m    Subjective:    Patient ID: Lindsey Bautista, female    DOB: Nov 13, 1960, 56 y.o.   MRN: [id number]  HPI: Lindsey Bautista is a 56 y.o. female  Chief Complaint  Patient presents with  . Hospitalization Follow-up    1. Right-sided weakness and dysarthria, ruled out TIA/CVA   Patient for review of hospitalization for TIA type symptoms again. Reviewed patient's extensive medical history with extensive and recurrent similar workups that she had in March 2018. These w/us have all come back negative. The patient has been to multiple medical specialists regarding these type symptoms with all negative workups. This problem has been ongoing for 3+ years. The symptoms have not gotten worse or gotten better. The patient has suffered complications from the falls. Most recently from yesterday with her right hand and wrist banged up from falling on the steps. Patient has accommodated her lifestyle and is not left alone and does not drive. The patient has seen a psychiatrist with ultimate diagnosis of conversion disorder with mixed symptoms. No treatment has been offered, or trying multiple medications and different medications for various conditions with no difference in patient's outcome.  Relevant past medical, surgical, family and social history reviewed and updated as indicated. Interim medical history since our last visit reviewed. Allergies and medications reviewed and updated.  Review of Systems  Constitutional: Negative.   Respiratory: Negative.   Cardiovascular: Negative.     Per HPI unless specifically indicated above     Objective:    BP 125/76   Pulse 74   Ht 5\' 1"  (1.549 m)   Wt 140 lb (63.5 kg)   SpO2 98%   BMI 26.45 kg/m   Wt Readings from Last 3 Encounters:  12/04/16 140 lb (63.5 kg)  11/16/16 144 lb 8 oz (65.5 kg)  09/19/16 139 lb (63 kg)    Physical Exam  Constitutional:  She is oriented to person, place, and time. She appears well-developed and well-nourished.  HENT:  Head: Normocephalic and atraumatic.  Eyes: Conjunctivae and EOM are normal.  Neck: Normal range of motion.  Cardiovascular: Normal rate, regular rhythm and normal heart sounds.   Pulmonary/Chest: Effort normal and breath sounds normal.  Musculoskeletal: Normal range of motion.  Right wrist and arm with reviewed more tenderness around the wrist from contusion but no evidence of point tenderness or limited range of motion except due to discomfort.  Neurological: She is alert and oriented to person, place, and time.  Skin: No erythema.  Psychiatric: She has a normal mood and affect. Her behavior is normal. Judgment and thought content normal.    Results for orders placed or performed during the hospital encounter of 68/34/19  Basic metabolic panel  Result Value Ref Range   Sodium 136 135 - 145 mmol/L   Potassium 3.8 3.5 - 5.1 mmol/L   Chloride 103 101 - 111 mmol/L   CO2 25 22 - 32 mmol/L   Glucose, Bld 91 65 - 99 mg/dL   BUN 13 6 - 20 mg/dL   Creatinine, Ser 0.83 0.44 - 1.00 mg/dL   Calcium 9.3 8.9 - 10.3 mg/dL   GFR calc non Af Amer >60 >60 mL/min   GFR calc Af Amer >60 >60 mL/min   Anion gap 8 5 - 15  CBC with Differential  Result  Value Ref Range   WBC 8.1 3.6 - 11.0 K/uL   RBC 4.57 3.80 - 5.20 MIL/uL   Hemoglobin 13.6 12.0 - 16.0 g/dL   HCT 39.9 35.0 - 47.0 %   MCV 87.4 80.0 - 100.0 fL   MCH 29.8 26.0 - 34.0 pg   MCHC 34.1 32.0 - 36.0 g/dL   RDW 13.7 11.5 - 14.5 %   Platelets 252 150 - 440 K/uL   Neutrophils Relative % 49 %   Neutro Abs 3.9 1.4 - 6.5 K/uL   Lymphocytes Relative 41 %   Lymphs Abs 3.3 1.0 - 3.6 K/uL   Monocytes Relative 8 %   Monocytes Absolute 0.6 0.2 - 0.9 K/uL   Eosinophils Relative 1 %   Eosinophils Absolute 0.1 0 - 0.7 K/uL   Basophils Relative 1 %   Basophils Absolute 0.1 0 - 0.1 K/uL  Troponin I  Result Value Ref Range   Troponin I <0.03 <0.03  ng/mL  Ethanol  Result Value Ref Range   Alcohol, Ethyl (B) <5 <5 mg/dL  Urinalysis, Routine w reflex microscopic  Result Value Ref Range   Color, Urine STRAW (A) YELLOW   APPearance CLEAR (A) CLEAR   Specific Gravity, Urine 1.009 1.005 - 1.030   pH 7.0 5.0 - 8.0   Glucose, UA NEGATIVE NEGATIVE mg/dL   Hgb urine dipstick NEGATIVE NEGATIVE   Bilirubin Urine NEGATIVE NEGATIVE   Ketones, ur NEGATIVE NEGATIVE mg/dL   Protein, ur NEGATIVE NEGATIVE mg/dL   Nitrite NEGATIVE NEGATIVE   Leukocytes, UA NEGATIVE NEGATIVE  Urine Drug Screen, Qualitative ([facility] only)  Result Value Ref Range   Tricyclic, Ur Screen NONE DETECTED NONE DETECTED   Amphetamines, Ur Screen NONE DETECTED NONE DETECTED   MDMA (Ecstasy)Ur Screen NONE DETECTED NONE DETECTED   Cocaine Metabolite,Ur [place] NONE DETECTED NONE DETECTED   Opiate, Ur Screen NONE DETECTED NONE DETECTED   Phencyclidine (PCP) Ur S NONE DETECTED NONE DETECTED   Cannabinoid 50 Ng, Ur [place] NONE DETECTED NONE DETECTED   Barbiturates, Ur Screen NONE DETECTED NONE DETECTED   Benzodiazepine, Ur Scrn NONE DETECTED NONE DETECTED   Methadone Scn, Ur NONE DETECTED NONE DETECTED  Troponin I  Result Value Ref Range   Troponin I <0.03 <0.03 ng/mL  Troponin I  Result Value Ref Range   Troponin I <0.03 <0.03 ng/mL  Hemoglobin A1c  Result Value Ref Range   Hgb A1c MFr Bld 5.3 4.8 - 5.6 %   Mean Plasma Glucose 105 mg/dL  Lipid panel  Result Value Ref Range   Cholesterol 228 (H) 0 - 200 mg/dL   Triglycerides 119 <150 mg/dL   HDL 49 >40 mg/dL   Total CHOL/HDL Ratio 4.7 RATIO   VLDL 24 0 - 40 mg/dL   LDL Cholesterol 155 (H) 0 - 99 mg/dL  Troponin I  Result Value Ref Range   Troponin I <0.03 <0.03 ng/mL  Sedimentation rate  Result Value Ref Range   Sed Rate 16 0 - 30 mm/hr  TSH  Result Value Ref Range   TSH 2.056 0.350 - 4.500 uIU/mL  Echocardiogram  Result Value Ref Range   Weight 2,312 oz   Height 61 in   BP 108/52 mmHg      Assessment & Plan:    Problem List Items Addressed This Visit      Cardiovascular and Mediastinum   Situational syncope   Relevant Medications   nitroGLYCERIN (NITROSTAT) 0.4 MG SL tablet   Syncope and collapse   Relevant  Medications   nitroGLYCERIN (NITROSTAT) 0.4 MG SL tablet   RESOLVED: TIA (transient ischemic attack)   Relevant Medications   nitroGLYCERIN (NITROSTAT) 0.4 MG SL tablet     Nervous and Auditory   Right sided weakness     Other   Conversion disorder with mixed symptoms    Conversion disorder presents with strokelike symptoms syncope and collapse area and multiple negative workups Symptoms also include cardiovascular symptoms with multiple negative cardiovascular workups.  From note 12-04-2016 Patient for review of hospitalization for TIA type symptoms again. Reviewed patient's extensive medical history with extensive and recurrent similar workups that she had in March 2018. These w/us have all come back negative. The patient has been to multiple medical specialists regarding these type symptoms with all negative workups. This problem has been ongoing for 3+ years. The symptoms have not gotten worse or gotten better. The patient has suffered complications from the falls. Most recently from yesterday with her right hand and wrist banged up from falling on the steps. Patient has accommodated her lifestyle and is not left alone and does not drive. The patient has seen a psychiatrist with ultimate diagnosis of conversion disorder with mixed symptoms. No treatment has been offered, or trying multiple medications and different medications for various conditions with no difference in patient's outcome.           Follow up plan: Return in about 3 months (around 03/05/2017), or if symptoms worsen or fail to improve.

## 2016-12-04 NOTE — Assessment & Plan Note (Signed)
Conversion disorder presents with strokelike symptoms syncope and collapse area and multiple negative workups Symptoms also include cardiovascular symptoms with multiple negative cardiovascular workups.  From note 12-04-2016 Patient for review of hospitalization for TIA type symptoms again. Reviewed patient's extensive medical history with extensive and recurrent similar workups that she had in March 2018. These w/us have all come back negative. The patient has been to multiple medical specialists regarding these type symptoms with all negative workups. This problem has been ongoing for 3+ years. The symptoms have not gotten worse or gotten better. The patient has suffered complications from the falls. Most recently from yesterday with her right hand and wrist banged up from falling on the steps. Patient has accommodated her lifestyle and is not left alone and does not drive. The patient has seen a psychiatrist with ultimate diagnosis of conversion disorder with mixed symptoms. No treatment has been offered, or trying multiple medications and different medications for various conditions with no difference in patient's outcome.

## 2016-12-06 DIAGNOSIS — E279 Disorder of adrenal gland, unspecified: Secondary | ICD-10-CM | POA: Diagnosis not present

## 2017-01-01 ENCOUNTER — Telehealth: Payer: Self-pay | Admitting: Cardiovascular Disease

## 2017-01-01 NOTE — Telephone Encounter (Signed)
Spoke w/ pt.  She reports that she was in the hospital the end of March w/ another TIA.  She had a carotid u/s done and she overheard the tech say that it looked like her "blood was running backwards".  She has had swelling in her rt left for about a week now, reports it is painful and weeping. Reports swelling is from her ankles up to her thigh.  She denies redness or heat, reports that her whole leg is cool. She called her PCP, but he is going to be out of town, she is calling us. Sched her to see Dr. Rockey Situ tomorrow @ 1:40; advised her to proceed to the ED if her sx become emergent.

## 2017-01-01 NOTE — Telephone Encounter (Signed)
Pt calling she is worried and not sure what could be causing this   Pt c/o swelling: STAT is pt has developed SOB within 24 hours  1. How long have you been experiencing swelling? About a week now   2. Where is the swelling located? Right leg   3.  Are you currently taking a "fluid pill"? No she is not on any   4.  Are you currently SOB? no  5.  Have you traveled recently? No

## 2017-01-02 ENCOUNTER — Encounter: Payer: Self-pay | Admitting: Cardiovascular Disease

## 2017-01-02 ENCOUNTER — Ambulatory Visit (INDEPENDENT_AMBULATORY_CARE_PROVIDER_SITE_OTHER): Payer: Medicare Other | Admitting: Cardiovascular Disease

## 2017-01-02 VITALS — BP 112/54 | HR 64 | Ht 61.0 in | Wt 139.2 lb

## 2017-01-02 DIAGNOSIS — R079 Chest pain, unspecified: Secondary | ICD-10-CM

## 2017-01-02 DIAGNOSIS — I639 Cerebral infarction, unspecified: Secondary | ICD-10-CM | POA: Diagnosis not present

## 2017-01-02 NOTE — Progress Notes (Signed)
Cardiology Office Note  Date:  01/02/2017   ID:  Lindsey Bautista, DOB [birth year], MRN [id number]  PCP:  Lindsey Maple, MD   Chief Complaint  Patient presents with  . other    C/o right leg pain and swelling. Meds reviewed verbally with pt.    HPI:  Lindsey Bautista is a 56 year old woman with history of  Medication noncompliance anxiety/depression,  long smoking history for close to 40 years, who continues to smoke stroke type symptoms in April 2015 where she could not talk for 4 days, again in April 2017 diagnosed with conversion disorder again in March 2018 who presents for prior episode of syncope, chest pain.  Stroke type symptoms April 2017 diagnosed with conversion disorder Stress test January 2018 for chest pain with no ischemia  Had  stroke type symptomsMarch 2018 Right-sided weakness, migraine She had chest pain, multiple problems, right-sided weakness, headache Carotid u/s mild disease MRI was negative. Started on for verapamil for migraine.  Not taking lipitor, has several bottles at home as she kept getting refills Not taking verapamil, "scared"  She reports that her right leg is swollen and hurts Worried about a blood clot  Notes indicating shedisability for CVA, memory Issues On previous office visit she was Crying, "stress", "hurts all over" In the past,Arms, legs are hurting, etiology unclear  At baseline she reports having No energy Stays in bed all day Lives with husband  stress test 04/2015, 08/2016  continues to smoke, less than before, 1/2 ppd Trying to quit on her own  EKG on today's visit shows normal sinus rhythm with rate 64 bpm, no significant ST or T-wave changes  Other past medical history reviewed Previously having "passed out spells" sometimes when she is sitting, sometimes when she is standing.  Previous history of excess somnolence, random periods of weakness She is never injured herself when she "passes out" even despite having events when  she is standing up.  CT scan of the abdomen from December 2016 Images pulled up in the office, showing moderate diffuse PAD extending into the common iliac arteries  Previous episode of chest pain, had a stress test in 2015 which was reportedly normal.   stress test in 2013 which was normal by report.  Previous Orthostatics done in the office did not show any significant drop in blood pressure Currently not on any cardiac medications .  PMH:   has a past medical history of COPD (chronic obstructive pulmonary disease) ([place]); Depression; GERD (gastroesophageal reflux disease); Hypotension; IBS (irritable bowel syndrome); Right arm weakness; Situational syncope; and TIA (transient ischemic attack).  PSH:    Past Surgical History:  Procedure Laterality Date  . COLONOSCOPY WITH PROPOFOL N/A 11/22/2015   Procedure: COLONOSCOPY WITH PROPOFOL;  Surgeon: Lindsey Silvas, MD;  Location: [facility] ENDOSCOPY;  Service: Endoscopy;  Laterality: N/A;  . ESOPHAGOGASTRODUODENOSCOPY (EGD) WITH PROPOFOL N/A 11/22/2015   Procedure: ESOPHAGOGASTRODUODENOSCOPY (EGD) WITH PROPOFOL;  Surgeon: Lindsey Silvas, MD;  Location: [facility] ENDOSCOPY;  Service: Endoscopy;  Laterality: N/A;  . TONSILLECTOMY    . VAGINAL HYSTERECTOMY      Current Outpatient Prescriptions  Medication Sig Dispense Refill  . aspirin 81 MG tablet Take 81 mg by mouth daily.    [name] atorvastatin (LIPITOR) 80 MG tablet Take 1 tablet (80 mg total) by mouth daily. 30 tablet 11  . clonazePAM (KLONOPIN) 1 MG tablet Take 1 tablet (1 mg total) by mouth 2 (two) times daily. 60 tablet 2  . estradiol (ESTRACE) 1 MG  tablet Take 1 tablet (1 mg total) by mouth daily. 30 tablet 5  . FLUoxetine (PROZAC) 20 MG capsule Take 1 capsule (20 mg total) by mouth daily. 30 capsule 12  . nitroGLYCERIN (NITROSTAT) 0.4 MG SL tablet Place 1 tablet (0.4 mg total) under the tongue every 5 (five) minutes as needed for chest pain. 50 tablet 3   No current facility-administered  medications for this visit.      Allergies:   Penicillin g and Iodinated diagnostic agents   Social History:  The patient  reports that she has been smoking Cigarettes.  She has a 25.00 pack-year smoking history. She has never used smokeless tobacco. She reports that she does not drink alcohol or use drugs.   Family History:   family history includes Breast cancer (age of onset: 91) in her paternal aunt.    Review of Systems: Review of Systems  Constitutional: Positive for malaise/fatigue.  Respiratory: Negative.   Cardiovascular: Positive for leg swelling.  Gastrointestinal: Negative.   Musculoskeletal: Negative.        Right leg is swollen and hurts  Neurological: Negative.   Psychiatric/Behavioral: Positive for depression.  All other systems reviewed and are negative.    PHYSICAL EXAM: VS:  BP (!) 112/54 (BP Location: Left Arm, Patient Position: Sitting, Cuff Size: Normal)   Pulse 64   Ht 5\' 1"  (1.549 m)   Wt 139 lb 4 oz (63.2 kg)   BMI 26.31 kg/m  , BMI Body mass index is 26.31 kg/m. GEN: Well nourished, well developed, in no acute distress  HEENT: normal  Neck: no JVD, carotid bruits, or masses Cardiac: RRR; no murmurs, rubs, or gallops,no edema  Respiratory:  clear to auscultation bilaterally, normal work of breathing GI: soft, nontender, nondistended, + BS MS: no deformity or atrophy  Skin: warm and dry, no rash Neuro:  Strength and sensation are intact Psych: full affect, tearful in the exam room today, better at the end of the visit    Recent Labs: 08/31/2016: ALT (SGPT) [place], Waived 18 11/16/2016: BUN 13; Creatinine, Ser 0.83; Hemoglobin 13.6; Platelets 252; Potassium 3.8; Sodium 136 11/17/2016: TSH 2.056    Lipid Panel Lab Results  Component Value Date   CHOL 228 (H) 11/17/2016   HDL 49 11/17/2016   LDLCALC 155 (H) 11/17/2016   TRIG 119 11/17/2016      Wt Readings from Last 3 Encounters:  01/02/17 139 lb 4 oz (63.2 kg)  12/04/16 140 lb (63.5  kg)  11/16/16 144 lb 8 oz (65.5 kg)       ASSESSMENT AND PLAN:  PAD (peripheral artery disease) (HCC) She has adequate distal pulses on exam. No further testing at this time Moderate disease seen on CT scan Recommended she restart her cholesterol medication  Situational syncope No recent episodes of syncope per the patient  Abdominal aortic atherosclerosis ([place]) Smoking cessation, aggressive lipid management Recommended she restart Lipitor.  Mixed hyperlipidemia - Plan: EKG 12-Lead Lipitor previously held for muscle aching Will restart  Smoker - Plan: EKG 12-Lead Discussed various ways to stop smoking including nicotine gum, lozenges  Centrilobular emphysema (HCC) Continues to smoke, chronic mild shortness of breath on exertion Smoking cessation recommended  Chest pressure - Plan: EKG 12-Lead Stress test January 2018 with no ischemia  Depression/anxiety Managed by primary care  Right leg pain Etiology unclear Prior history of limb pain, atypical in nature Palpable pulses no significant swelling No further testing needed   Total encounter time more than 25 minutes  Greater than 50% was spent in counseling and coordination of care with the patient   Disposition:   F/U  12 months as needed   No orders of the defined types were placed in this encounter.    Signed, Esmond Plants, M.D., Ph.D. 01/02/2017  [place], [place]

## 2017-01-02 NOTE — Patient Instructions (Addendum)
Medication Instructions:   Please restart atorvastatin one a day   Labwork:  No new labs needed  Testing/Procedures:  No further testing at this time   I recommend watching educational videos on topics of interest to you at:       [email or web address]  Enter code: [email or web address]    Follow-Up: It was a pleasure seeing you in the office today. Please call us if you have new issues that need to be addressed before your next appt.  [phone]  Your physician wants you to follow-up in: 12 months.  You will receive a reminder letter in the mail two months in advance. If you don't receive a letter, please call our office to schedule the follow-up appointment.  If you need a refill on your cardiac medications before your next appointment, please call your pharmacy.

## 2017-02-20 ENCOUNTER — Ambulatory Visit: Payer: Self-pay | Admitting: Family Medicine

## 2017-02-28 ENCOUNTER — Other Ambulatory Visit: Payer: Self-pay | Admitting: Family Medicine

## 2017-02-28 MED ORDER — CLONAZEPAM 1 MG PO TABS: 1.0000 mg | ORAL_TABLET | Freq: Two times a day (BID) | ORAL | 2 refills | Status: DC

## 2017-02-28 NOTE — Telephone Encounter (Signed)
Pt would like to have a refill for klonopin sent to [facility] [place]. Pt has an appt scheduled for 04/04/17.

## 2017-03-12 ENCOUNTER — Emergency Department
Admission: EM | Admit: 2017-03-12 | Discharge: 2017-03-12 | Disposition: A | Discharge status: Home / Self Care | Payer: Medicare Other | Attending: Emergency Medicine | Admitting: Emergency Medicine

## 2017-03-12 ENCOUNTER — Emergency Department: Payer: Medicare Other

## 2017-03-12 DIAGNOSIS — I714 Abdominal aortic aneurysm, without rupture: Secondary | ICD-10-CM | POA: Insufficient documentation

## 2017-03-12 DIAGNOSIS — G43109 Migraine with aura, not intractable, without status migrainosus: Secondary | ICD-10-CM

## 2017-03-12 DIAGNOSIS — R4182 Altered mental status, unspecified: Secondary | ICD-10-CM | POA: Diagnosis not present

## 2017-03-12 DIAGNOSIS — Z8673 Personal history of transient ischemic attack (TIA), and cerebral infarction without residual deficits: Secondary | ICD-10-CM | POA: Insufficient documentation

## 2017-03-12 DIAGNOSIS — R200 Anesthesia of skin: Secondary | ICD-10-CM | POA: Diagnosis not present

## 2017-03-12 DIAGNOSIS — R51 Headache: Secondary | ICD-10-CM | POA: Diagnosis not present

## 2017-03-12 DIAGNOSIS — R499 Unspecified voice and resonance disorder: Secondary | ICD-10-CM | POA: Insufficient documentation

## 2017-03-12 DIAGNOSIS — F17210 Nicotine dependence, cigarettes, uncomplicated: Secondary | ICD-10-CM | POA: Insufficient documentation

## 2017-03-12 DIAGNOSIS — R531 Weakness: Secondary | ICD-10-CM | POA: Insufficient documentation

## 2017-03-12 DIAGNOSIS — R29810 Facial weakness: Secondary | ICD-10-CM | POA: Diagnosis not present

## 2017-03-12 DIAGNOSIS — R29818 Other symptoms and signs involving the nervous system: Secondary | ICD-10-CM | POA: Diagnosis not present

## 2017-03-12 DIAGNOSIS — J449 Chronic obstructive pulmonary disease, unspecified: Secondary | ICD-10-CM | POA: Diagnosis not present

## 2017-03-12 LAB — CBC
HEMATOCRIT: 42.5 % (ref 35.0–47.0)
Hemoglobin: 14.2 g/dL (ref 12.0–16.0)
MCH: 28.8 pg (ref 26.0–34.0)
MCHC: 33.4 g/dL (ref 32.0–36.0)
MCV: 86.1 fL (ref 80.0–100.0)
PLATELETS: 289 10*3/uL (ref 150–440)
RBC: 4.93 MIL/uL (ref 3.80–5.20)
RDW: 14.4 % (ref 11.5–14.5)
WBC: 5.2 10*3/uL (ref 3.6–11.0)

## 2017-03-12 LAB — COMPREHENSIVE METABOLIC PANEL
ALT: 13 U/L — AB (ref 14–54)
ANION GAP: 9 (ref 5–15)
AST: 23 U/L (ref 15–41)
Albumin: 4.3 g/dL (ref 3.5–5.0)
Alkaline Phosphatase: 75 U/L (ref 38–126)
BUN: 9 mg/dL (ref 6–20)
CALCIUM: 9.7 mg/dL (ref 8.9–10.3)
CO2: 27 mmol/L (ref 22–32)
CREATININE: 0.86 mg/dL (ref 0.44–1.00)
Chloride: 105 mmol/L (ref 101–111)
Glucose, Bld: 90 mg/dL (ref 65–99)
Potassium: 4.1 mmol/L (ref 3.5–5.1)
Sodium: 141 mmol/L (ref 135–145)
TOTAL PROTEIN: 8 g/dL (ref 6.5–8.1)
Total Bilirubin: 0.7 mg/dL (ref 0.3–1.2)

## 2017-03-12 LAB — DIFFERENTIAL
BASOS PCT: 1 %
Basophils Absolute: 0 10*3/uL (ref 0–0.1)
EOS PCT: 1 %
Eosinophils Absolute: 0.1 10*3/uL (ref 0–0.7)
LYMPHS PCT: 45 %
Lymphs Abs: 2.3 10*3/uL (ref 1.0–3.6)
MONO ABS: 0.4 10*3/uL (ref 0.2–0.9)
MONOS PCT: 8 %
NEUTROS ABS: 2.4 10*3/uL (ref 1.4–6.5)
Neutrophils Relative %: 45 %

## 2017-03-12 LAB — GLUCOSE, CAPILLARY: Glucose-Capillary: 90 mg/dL (ref 65–99)

## 2017-03-12 LAB — TROPONIN I

## 2017-03-12 LAB — PROTIME-INR
INR: 0.86
Prothrombin Time: 11.7 seconds (ref 11.4–15.2)

## 2017-03-12 LAB — APTT: aPTT: 26 seconds (ref 24–36)

## 2017-03-12 MED ORDER — KETOROLAC TROMETHAMINE 30 MG/ML IJ SOLN
30.0000 mg | Freq: Once | INTRAMUSCULAR | Status: AC
  Administered 2017-03-12: 30 mg via INTRAVENOUS
  Filled 2017-03-12: qty 1

## 2017-03-12 NOTE — ED Notes (Signed)
Pt taken to MRI  

## 2017-03-12 NOTE — ED Notes (Signed)
ED Provider at bedside. 

## 2017-03-12 NOTE — ED Notes (Signed)
Called Code stroke to [phone]

## 2017-03-12 NOTE — ED Provider Notes (Signed)
[facility] Emergency Department Provider Note  ____________________________________________  Time seen: Approximately 12:16 PM  I have reviewed the triage vital signs and the nursing notes.   HISTORY  Chief Complaint Weakness and Headache    HPI Lindsey Bautista is a 56 y.o. female with a history of TIA, presenting for whispering voice, right upper extremity weakness.The patient reports that approximately 2 hours ago she was "laying down" when she began to have a whispering voice. In addition she felt weak in the right arm with decreased sensation to light touch. The patient has a mild headache "all over." She denies any visual changes, mental status changes or recent illness. No recent changes in medications. No fevers or chills, no dysuria. No trauma.   Past Medical History:  Diagnosis Date  . COPD (chronic obstructive pulmonary disease) ([place])   . Depression   . GERD (gastroesophageal reflux disease)   . Hypotension   . IBS (irritable bowel syndrome)   . Right arm weakness   . Situational syncope   . TIA (transient ischemic attack)     Patient Active Problem List   Diagnosis Date Noted  . Right sided weakness 11/16/2016  . Conversion disorder with mixed symptoms 03/30/2016  . Abdominal aortic atherosclerosis ([place]) 12/29/2015  . Adrenal gland cyst ([place]) 12/29/2015  . Anxiety state   . Syncope and collapse   . COPD (chronic obstructive pulmonary disease) ([place]) 12/13/2015  . Chest pain 12/13/2015  . GERD (gastroesophageal reflux disease)   . Gastroesophageal reflux disease without esophagitis   . PAD (peripheral artery disease) ([place]) 10/22/2015  . Hyperlipidemia 10/22/2015  . Cervical paraspinal muscle spasm 09/30/2015  . Benzodiazepine dependence, continuous ([place]) 09/08/2015  . Acute anxiety 06/07/2015  . Depression 06/07/2015  . Situational syncope 06/07/2015  . Smoker 04/21/2015    Past Surgical History:  Procedure Laterality Date  .  COLONOSCOPY WITH PROPOFOL N/A 11/22/2015   Procedure: COLONOSCOPY WITH PROPOFOL;  Surgeon: Manya Silvas, MD;  Location: [facility] ENDOSCOPY;  Service: Endoscopy;  Laterality: N/A;  . ESOPHAGOGASTRODUODENOSCOPY (EGD) WITH PROPOFOL N/A 11/22/2015   Procedure: ESOPHAGOGASTRODUODENOSCOPY (EGD) WITH PROPOFOL;  Surgeon: Manya Silvas, MD;  Location: [facility] ENDOSCOPY;  Service: Endoscopy;  Laterality: N/A;  . TONSILLECTOMY    . VAGINAL HYSTERECTOMY      Current Outpatient Rx  . Order #: [id number] Class: Historical Med  . Order #: [id number] Class: Normal  . Order #: [id number] Class: Print  . Order #: [id number] Class: Normal  . Order #: [id number] Class: Normal  . Order #: [id number] Class: Normal    Allergies Penicillin g and Iodinated diagnostic agents  Family History  Problem Relation Age of Onset  . Heart disease Unknown   . COPD Unknown   . Stroke Unknown   . Breast cancer Paternal Aunt 62    Social History Social History  Substance Use Topics  . Smoking status: Current Every Day Smoker    Packs/day: 1.00    Years: 25.00    Types: Cigarettes  . Smokeless tobacco: Never Used  . Alcohol use No    Review of Systems Constitutional: No fever/chills.No lightheadedness or syncope. Eyes: No visual changes. No blurred or double vision. ENT: No sore throat. No congestion or rhinorrhea. Positive voice change. Cardiovascular: Denies chest pain. Denies palpitations. Respiratory: Denies shortness of breath.  No cough. Gastrointestinal: No abdominal pain.  No nausea, no vomiting.  No diarrhea.  No constipation. Genitourinary: Negative for dysuria. Musculoskeletal: Negative for back pain. Skin: Negative for rash. Neurological: Positive for headaches. Positive  right upper extremity numbness and weakness.     ____________________________________________   PHYSICAL EXAM:  VITAL SIGNS: ED Triage Vitals  Enc Vitals Group     BP 03/12/17 1208 (!) 154/118     Pulse Rate 03/12/17 1208 68      Resp 03/12/17 1208 16     Temp 03/12/17 1208 98.8 F (37.1 C)     Temp Source 03/12/17 1208 Oral     SpO2 03/12/17 1208 99 %     Weight 03/12/17 1208 137 lb 2 oz (62.2 kg)     Height 03/12/17 1208 5\' 1"  (1.549 m)     Head Circumference --      Peak Flow --      Pain Score 03/12/17 1207 7     Pain Loc --      Pain Edu? --      Excl. in [place]? --     Constitutional: Alert and oriented. Anxious appearing and mildly tearful but in no acute distress. Answers questions appropriately. GCS is 15. The patient is protecting her airway. Eyes: Conjunctivae are normal.  EOMI. PERRLA. No scleral icterus. Head: Atraumatic. No raccoon eyes or Battle sign. Nose: No congestion/rhinnorhea. Mouth/Throat: Mucous membranes are moist.  Neck: No stridor.  Supple.  No JVD. No meningismus. Cardiovascular: Normal rate, regular rhythm. No murmurs, rubs or gallops.  Respiratory: Normal respiratory effort.  No accessory muscle use or retractions. Lungs CTAB.  No wheezes, rales or ronchi. Gastrointestinal: Soft, nontender and nondistended.  No guarding or rebound.  No peritoneal signs. Musculoskeletal: No LE edema. No ttp in the calves or palpable cords.  Negative Homan's sign. Neurologic: Alert and oriented 3. Speech is clear but the patient is whispering. Naming and repetition are intact. Face and smile symmetric. Tongue is midline. EOMI. PERRLA. No horizontal or vertical nystagmus. The patient is unable to comply with pronator drift testing due to right upper extremity weakness. 5 out of 5 grip, L biceps, bilateral triceps, L hip flexors, bilateral plantar flexion and dorsiflexion. 4+ out of 5 right biceps, right grip strength, right hip flexor. Normal sensation to light touch in the left upper and lower extremities, and left face; decreased sensation to the right face, upper extremity and lower extremity.. Unable to comply with heel-to-shin. Skin:  Skin is warm, dry and intact. No rash noted. Psychiatric: Mood and  affect are normal.   ____________________________________________   LABS (all labs ordered are listed, but only abnormal results are displayed)  Labs Reviewed  COMPREHENSIVE METABOLIC PANEL - Abnormal; Notable for the following:       Result Value   ALT 13 (*)    All other components within normal limits  PROTIME-INR  APTT  CBC  DIFFERENTIAL  TROPONIN I  GLUCOSE, CAPILLARY  CBG MONITORING, ED   ____________________________________________  EKG  ED ECG REPORT I, Eula Listen, the attending physician, personally viewed and interpreted this ECG.   Date: 03/12/2017  EKG Time: 1211  Rate: 60  Rhythm: normal sinus rhythm  Axis: normal  Intervals:none  ST&T Change: No STEMI  ____________________________________________  RADIOLOGY  Mr Brain Wo Contrast  Result Date: 03/12/2017 CLINICAL DATA:  Initial evaluation for acute right-sided weakness with numbness. EXAM: MRI HEAD WITHOUT CONTRAST TECHNIQUE: Multiplanar, multiecho pulse sequences of the brain and surrounding structures were obtained without intravenous contrast. COMPARISON:  Prior CT from earlier the same day. FINDINGS: Brain: Cerebral volume within normal limits for age. No significant cerebral white matter changes. No abnormal foci of restricted diffusion to  suggest acute or subacute ischemia. Gray-white matter differentiation well maintained. No encephalomalacia to suggest chronic infarction. No evidence for acute or chronic intracranial hemorrhage. No mass lesion, midline shift or mass effect. No hydrocephalus. No extra-axial fluid collection. Major dural sinuses are grossly patent. Pituitary gland suprasellar region within normal limits. Midline structures intact and normal. Vascular: Major intracranial vascular flow voids are well maintained. Skull and upper cervical spine: Craniocervical junction within normal limits. Visualized upper cervical spine unremarkable. Bone marrow signal intensity within normal  limits. No scalp soft tissue abnormality. Sinuses/Orbits: Globes and orbital soft tissues within normal limits. Paranasal sinuses are clear. No mastoid effusion. Inner ear structures normal. IMPRESSION: Normal brain MRI.  No acute intracranial process identified. Electronically Signed   By: Jeannine Boga M.D.   On: 03/12/2017 13:48   Ct Head Code Stroke W/o Cm  Result Date: 03/12/2017 CLINICAL DATA:  Code stroke. RIGHT-sided weakness. Symptoms for 2 hours. Headache for 3 days. EXAM: CT HEAD WITHOUT CONTRAST TECHNIQUE: Contiguous axial images were obtained from the base of the skull through the vertex without intravenous contrast. COMPARISON:  MR brain 11/17/2016.  CT head 11/16/2016. FINDINGS: Brain: No evidence of acute infarction, hemorrhage, hydrocephalus, extra-axial collection or mass lesion/mass effect. Normal cerebral volume. No white matter disease. Vascular: No hyperdense vessel or unexpected calcification. Skull: Normal. Negative for fracture or focal lesion. Sinuses/Orbits: No acute finding. Other: None. ASPECTS [facility] Stroke Program Early CT Score) - Ganglionic level infarction (caudate, lentiform nuclei, internal capsule, insula, M1-M3 cortex): 7 - Supraganglionic infarction (M4-M6 cortex): 3 Total score (0-10 with 10 being normal): 10 IMPRESSION: 1. Negative exam.  No change from priors. 2. ASPECTS is 10. These results were called by telephone at the time of interpretation on 03/12/2017 at 12:35 pm to Dr. Eula Listen , who verbally acknowledged these results. Electronically Signed   By: Staci Righter M.D.   On: 03/12/2017 12:36    ____________________________________________   PROCEDURES  Procedure(s) performed: None  Procedures  Critical Care performed: Yes, see critical care note(s) ____________________________________________   INITIAL IMPRESSION / ASSESSMENT AND PLAN / ED COURSE  Pertinent labs & imaging results that were available during my care of the  patient were reviewed by me and considered in my medical decision making (see chart for details).  56 y.o. female with a history of TIA presenting with 2 hours of whispering voice, right upper extremity and right lower extremity weakness, with headache. The patient is hemodynamically stable. She does have several neurologic deficits, although her examination is inconsistent at times. She immediately underwent CT examination, which did not reveal any acute intracranial process. Dr. Doy Mince was called immediately to the bedside for further evaluation, and also agreed that the patient's signs and symptoms were atypical for acute stroke. MR was ordered and the patient had a normal scan. Given her multiple recent evaluations, with thorough examinations for stroke workup, acute admission is not warranted at this time. The patient will be discharged home in stable condition.    ----------------------------------------- 12:35 PM on 03/12/2017 -----------------------------------------  Have just received a call from the radiologist; her CT scan does not show any acute process.  ----------------------------------------- 2:33 PM on 03/12/2017 -----------------------------------------  At this time, the patient feels almost completely back to baseline. She is able tablet without any difficulty. Her headache has resolved. We will plan to discharge the patient home with close follow-up. Return per cautions were discussed. ____________________________________________  FINAL CLINICAL IMPRESSION(S) / ED DIAGNOSES  Final diagnoses:  Voice disorder  Right  sided weakness  Numbness         NEW MEDICATIONS STARTED DURING THIS VISIT:  New Prescriptions   No medications on file      Eula Listen, MD 03/12/17 1434

## 2017-03-12 NOTE — ED Notes (Signed)
Dr. Reynolds at bedside.

## 2017-03-12 NOTE — Consult Note (Signed)
Referring Physician: Mariea Clonts    Chief Complaint: Difficulty with speech and right sided weakness/numbness  HPI: Lindsey Bautista is an 56 y.o. female with a history of multiple episodes of difficulty with speech and right sided weakness who presents today reporting that she has had a headache for the past 2 days.  Reports that the headache is left sided and throbbing.  Otherwise went to bed at baseline last night and awakened at baseline as well.  As the morning developed and noted that she was unable to talk louder than a whisper and developed right sided numbness and weakness.  EMS was called after no initial improvement.  Patient reports she has had these episodes in the past related to left sided headache.  Last ws in March of this year.  Work up at that time was unremarkable.  Patient reports that symptoms take 2-4 days to resolve spontaneously.  Initial NIHSS of 5.    Date last known well: Date: 03/12/2017 Time last known well: Time: 10:07 tPA Given: No: Patient refused  Past Medical History:  Diagnosis Date  . COPD (chronic obstructive pulmonary disease) ([place])   . Depression   . GERD (gastroesophageal reflux disease)   . Hypotension   . IBS (irritable bowel syndrome)   . Right arm weakness   . Situational syncope   . TIA (transient ischemic attack)     Past Surgical History:  Procedure Laterality Date  . COLONOSCOPY WITH PROPOFOL N/A 11/22/2015   Procedure: COLONOSCOPY WITH PROPOFOL;  Surgeon: Manya Silvas, MD;  Location: [facility] ENDOSCOPY;  Service: Endoscopy;  Laterality: N/A;  . ESOPHAGOGASTRODUODENOSCOPY (EGD) WITH PROPOFOL N/A 11/22/2015   Procedure: ESOPHAGOGASTRODUODENOSCOPY (EGD) WITH PROPOFOL;  Surgeon: Manya Silvas, MD;  Location: [facility] ENDOSCOPY;  Service: Endoscopy;  Laterality: N/A;  . TONSILLECTOMY    . VAGINAL HYSTERECTOMY      Family History  Problem Relation Age of Onset  . Heart disease Unknown   . COPD Unknown   . Stroke Unknown   . Breast cancer Paternal  Aunt 45   Social History:  reports that she has been smoking Cigarettes.  She has a 25.00 pack-year smoking history. She has never used smokeless tobacco. She reports that she does not drink alcohol or use drugs.  Allergies:  Allergies  Allergen Reactions  . Penicillin G Anaphylaxis and Other (See Comments)    Has patient had a PCN reaction causing immediate rash, facial/tongue/throat swelling, SOB or lightheadedness with hypotension: [id number]} Has patient had a PCN reaction causing severe rash involving mucus membranes or skin necrosis: no:[id number]} Has patient had a PCN reaction that required hospitalization no:[id number]} Has patient had a PCN reaction occurring within the last 10 years: [id number]} If all of the above answers are "NO", then may proceed with Cephalosporin use.   . Iodinated Diagnostic Agents Rash and Nausea And Vomiting    Medications: I have reviewed the patient's current medications. Prior to Admission:  Prior to Admission medications   Medication Sig Start Date End Date Taking? Authorizing Provider  aspirin 81 MG tablet Take 81 mg by mouth daily.   Yes [provider]  atorvastatin (LIPITOR) 80 MG tablet Take 1 tablet (80 mg total) by mouth daily. 04/14/16  Yes Gollan, Kathlene November, MD  clonazePAM (KLONOPIN) 1 MG tablet Take 1 tablet (1 mg total) by mouth 2 (two) times daily. 02/28/17  Yes Crissman, Jeannette How, MD  estradiol (ESTRACE) 1 MG tablet Take 1 tablet (1 mg total) by mouth daily. 12/04/16  Yes  [name], MD  FLUoxetine (PROZAC) 20 MG capsule Take 1 capsule (20 mg total) by mouth daily. 03/30/16  Yes Crissman, Jeannette How, MD  nitroGLYCERIN (NITROSTAT) 0.4 MG SL tablet Place 1 tablet (0.4 mg total) under the tongue every 5 (five) minutes as needed for chest pain. 12/04/16  Yes Crissman, Jeannette How, MD     ROS: History obtained from the patient  General ROS: negative for - chills, fatigue, fever, night sweats, weight gain or weight loss Psychological  ROS: negative for - behavioral disorder, hallucinations, memory difficulties, mood swings or suicidal ideation Ophthalmic ROS: negative for - blurry vision, double vision, eye pain or loss of vision ENT ROS: negative for - epistaxis, nasal discharge, oral lesions, sore throat, tinnitus or vertigo Allergy and Immunology ROS: negative for - hives or itchy/watery eyes Hematological and Lymphatic ROS: negative for - bleeding problems, bruising or swollen lymph nodes Endocrine ROS: negative for - galactorrhea, hair pattern changes, polydipsia/polyuria or temperature intolerance Respiratory ROS: negative for - cough, hemoptysis, shortness of breath or wheezing Cardiovascular ROS: negative for - chest pain, dyspnea on exertion, edema or irregular heartbeat Gastrointestinal ROS: negative for - abdominal pain, diarrhea, hematemesis, nausea/vomiting or stool incontinence Genito-Urinary ROS: negative for - dysuria, hematuria, incontinence or urinary frequency/urgency Musculoskeletal ROS: neck pain Neurological ROS: as noted in HPI Dermatological ROS: negative for rash and skin lesion changes  Physical Examination: Blood pressure 125/71, pulse (!) 55, temperature 98.8 F (37.1 C), temperature source Oral, resp. rate 12, height 5\' 1"  (1.549 m), weight 62.2 kg (137 lb 2 oz), SpO2 99 %.  HEENT-  Normocephalic, no lesions, without obvious abnormality.  Normal external eye and conjunctiva.  Normal TM's bilaterally.  Normal auditory canals and external ears. Normal external nose, mucus membranes and septum.  Normal pharynx. Cardiovascular- S1, S2 normal, pulses palpable throughout   Lungs- chest clear, no wheezing, rales, normal symmetric air entry Abdomen- soft, non-tender; bowel sounds normal; no masses,  no organomegaly Extremities- no edema Lymph-no adenopathy palpable Musculoskeletal-no joint tenderness, deformity or swelling Skin-warm and dry, no hyperpigmentation, vitiligo, or suspicious  lesions  Neurological Examination   Mental Status: Alert, oriented, thought content appropriate.  Speech fluent without evidence of aphasia but at a whisper.  Able to follow 3 step commands without difficulty. Cranial Nerves: II: Discs flat bilaterally; Visual fields grossly normal, pupils equal, round, reactive to light and accommodation III,IV, VI: ptosis not present, extra-ocular motions intact bilaterally V,VII: smile symmetric, facial light touch sensation decreased on the right VIII: hearing normal bilaterally IX,X: gag reflex present XI: bilateral shoulder shrug XII: midline tongue extension Motor: Patient able to lift all extremities against gravity but unable to maintain on the right.  Gives minimal downward effort using the LLE when the right is lift.  Pushes down with good strength using the right when the left is lifted.   Sensory: Pinprick and light touch decreased on the left Deep Tendon Reflexes: 2+ and symmetric throughout Plantars: Right: downgoing   Left: downgoing Cerebellar: Normal finger-to-nose and normal heel-to-shin testing.  Patient must be extensively encouraged to attempt to use the right. Gait: not tested due to safety concerns    Laboratory Studies:  Basic Metabolic Panel:  Recent Labs Lab 03/12/17 1207  NA 141  K 4.1  CL 105  CO2 27  GLUCOSE 90  BUN 9  CREATININE 0.86  CALCIUM 9.7    Liver Function Tests:  Recent Labs Lab 03/12/17 1207  AST 23  ALT 13*  ALKPHOS 75  BILITOT 0.7  PROT 8.0  ALBUMIN 4.3   No results for input(s): LIPASE, AMYLASE in the last 168 hours. No results for input(s): AMMONIA in the last 168 hours.  CBC:  Recent Labs Lab 03/12/17 1207  WBC 5.2  NEUTROABS 2.4  HGB 14.2  HCT 42.5  MCV 86.1  PLT 289    Cardiac Enzymes:  Recent Labs Lab 03/12/17 1207  TROPONINI <0.03    BNP: Invalid input(s): POCBNP  CBG:  Recent Labs Lab 03/12/17 1214  GLUCAP 90    Microbiology: Results for orders  placed or performed during the hospital encounter of 12/13/15  MRSA PCR Screening     Status: None   Collection Time: 12/14/15  1:52 PM  Result Value Ref Range Status   MRSA by PCR NEGATIVE NEGATIVE Final    Comment:        The GeneXpert MRSA Assay (FDA approved for NASAL specimens only), is one component of a comprehensive MRSA colonization surveillance program. It is not intended to diagnose MRSA infection nor to guide or monitor treatment for MRSA infections.     Coagulation Studies:  Recent Labs  03/12/17 1207  LABPROT 11.7  INR 0.86    Urinalysis: No results for input(s): COLORURINE, LABSPEC, PHURINE, GLUCOSEU, HGBUR, BILIRUBINUR, KETONESUR, PROTEINUR, UROBILINOGEN, NITRITE, LEUKOCYTESUR in the last 168 hours.  Invalid input(s): APPERANCEUR  Lipid Panel:    Component Value Date/Time   CHOL 228 (H) 11/17/2016 0645   CHOL 139 08/31/2016 1424   CHOL 203 (H) 11/28/2013 0424   TRIG 119 11/17/2016 0645   TRIG 71 08/31/2016 1424   TRIG 140 11/28/2013 0424   HDL 49 11/17/2016 0645   HDL 62 06/21/2015 1348   HDL 44 11/28/2013 0424   CHOLHDL 4.7 11/17/2016 0645   VLDL 24 11/17/2016 0645   VLDL 14 08/31/2016 1424   VLDL 28 11/28/2013 0424   LDLCALC 155 (H) 11/17/2016 0645   LDLCALC 169 (H) 06/21/2015 1348   LDLCALC 131 (H) 11/28/2013 0424    HgbA1C:  Lab Results  Component Value Date   HGBA1C 5.3 11/17/2016    Urine Drug Screen:     Component Value Date/Time   LABOPIA NONE DETECTED 11/16/2016 2130   LABOPIA NONE DETECTED 12/13/2015 2116   COCAINSCRNUR NONE DETECTED 11/16/2016 2130   LABBENZ NONE DETECTED 11/16/2016 2130   LABBENZ NONE DETECTED 12/13/2015 2116   AMPHETMU NONE DETECTED 11/16/2016 2130   AMPHETMU NONE DETECTED 12/13/2015 2116   THCU NONE DETECTED 11/16/2016 2130   THCU NONE DETECTED 12/13/2015 2116   LABBARB NONE DETECTED 11/16/2016 2130   LABBARB NONE DETECTED 12/13/2015 2116    Alcohol Level: No results for input(s): ETH in the last  168 hours.  Other results: EKG: sinus rhythm at 60 bpm.  Imaging: Ct Head Code Stroke W/o Cm  Result Date: 03/12/2017 CLINICAL DATA:  Code stroke. RIGHT-sided weakness. Symptoms for 2 hours. Headache for 3 days. EXAM: CT HEAD WITHOUT CONTRAST TECHNIQUE: Contiguous axial images were obtained from the base of the skull through the vertex without intravenous contrast. COMPARISON:  MR brain 11/17/2016.  CT head 11/16/2016. FINDINGS: Brain: No evidence of acute infarction, hemorrhage, hydrocephalus, extra-axial collection or mass lesion/mass effect. Normal cerebral volume. No white matter disease. Vascular: No hyperdense vessel or unexpected calcification. Skull: Normal. Negative for fracture or focal lesion. Sinuses/Orbits: No acute finding. Other: None. ASPECTS [facility] Stroke Program Early CT Score) - Ganglionic level infarction (caudate, lentiform nuclei, internal capsule, insula, M1-M3 cortex): 7 - Supraganglionic infarction (M4-M6 cortex):  3 Total score (0-10 with 10 being normal): 10 IMPRESSION: 1. Negative exam.  No change from priors. 2. ASPECTS is 10. These results were called by telephone at the time of interpretation on 03/12/2017 at 12:35 pm to Dr. Eula Listen , who verbally acknowledged these results. Electronically Signed   By: Staci Righter M.D.   On: 03/12/2017 12:36    Assessment: 56 y.o. female presenting with headache, soft speech and right hemiparesis.  Multiple functional features noted on neurological examination.  Patient has had multiple previous similar presentations.  The last was in March of this year.  Work up was unremarkable.  Patient on ASA at home.  Head CT today reviewed and shows no acute changes.  Suspect complicated migraine.  Patient has been tried on Verapamil in the past.    Stroke Risk Factors - smoking  Plan: 1. MRI of the brain without contrast 2. Analgesia for headache  3. Telemetry monitoring 4. Frequent neuro checks 5. If MRI unremarkable no  further neurological work up recommended at this time.  Patient to continue ASA daily and follow up with neurology concerning complicated migraine on an outpatient basis.   6. Smoking cessation counseling.  Case discussed with Dr. Lillia Mountain, MD Neurology [phone] 03/12/2017, 1:06 PM

## 2017-03-12 NOTE — ED Triage Notes (Signed)
Pt arrives to ER via [facility] from home c/o right sided that started approx 2 hours ago per patient report. Pt has a hard time speaking, states that "she lost her voice". Pt hx of TIA X 4. PT takes ASA daily. Pt states that she has head headache in temples x 3 days. Alert and oriented X 4 at this time, tearful. Skin warm and dry.

## 2017-03-12 NOTE — ED Notes (Signed)
Pt back from CT, pt transferred by this RN.

## 2017-03-12 NOTE — ED Notes (Signed)
Marya Amsler, Stroke coordinator at bedside

## 2017-03-12 NOTE — Discharge Instructions (Signed)
Please return to the emergency department if you develop severe pain, headache, numbness tingling or weakness, difficulty walking, changes in speech or vision, or any other symptoms concerning to you.

## 2017-03-12 NOTE — ED Notes (Signed)
Pt ambulates with minimal assistance. Pt states she feels that she is safe to go home.

## 2017-04-04 ENCOUNTER — Ambulatory Visit: Payer: Medicare Other | Admitting: Family Medicine

## 2017-04-06 ENCOUNTER — Other Ambulatory Visit: Payer: Self-pay | Admitting: Family Medicine

## 2017-04-09 NOTE — Telephone Encounter (Signed)
Last (acute) OV: 12/04/16 Last routine OV: 08/31/16 Next OV: None on file.

## 2017-05-02 ENCOUNTER — Telehealth: Payer: Self-pay | Admitting: Family Medicine

## 2017-05-02 NOTE — Telephone Encounter (Signed)
Verbal given to [facility].

## 2017-05-02 NOTE — Telephone Encounter (Signed)
Patient called to request a refill on her medication for clonazepam to [facility] on [address].  . Patient states pharmacy needs a verbal in order to refill medication.  Please Advise.  Thank you

## 2017-05-09 ENCOUNTER — Ambulatory Visit: Payer: Medicare Other | Admitting: Family Medicine

## 2017-05-09 ENCOUNTER — Ambulatory Visit (INDEPENDENT_AMBULATORY_CARE_PROVIDER_SITE_OTHER): Payer: Medicare Other

## 2017-05-09 ENCOUNTER — Encounter: Payer: Self-pay | Admitting: Family Medicine

## 2017-05-09 VITALS — BP 120/76 | HR 73 | Temp 98.5°F | Resp 16 | Ht 62.0 in | Wt 142.2 lb

## 2017-05-09 VITALS — BP 120/76 | HR 73 | Temp 98.5°F | Ht 62.0 in | Wt 142.0 lb

## 2017-05-09 DIAGNOSIS — K219 Gastro-esophageal reflux disease without esophagitis: Secondary | ICD-10-CM

## 2017-05-09 DIAGNOSIS — F419 Anxiety disorder, unspecified: Secondary | ICD-10-CM

## 2017-05-09 DIAGNOSIS — Z0000 Encounter for general adult medical examination without abnormal findings: Secondary | ICD-10-CM | POA: Diagnosis not present

## 2017-05-09 DIAGNOSIS — R8290 Unspecified abnormal findings in urine: Secondary | ICD-10-CM

## 2017-05-09 DIAGNOSIS — Z23 Encounter for immunization: Secondary | ICD-10-CM | POA: Diagnosis not present

## 2017-05-09 DIAGNOSIS — R4701 Aphasia: Secondary | ICD-10-CM

## 2017-05-09 DIAGNOSIS — R399 Unspecified symptoms and signs involving the genitourinary system: Secondary | ICD-10-CM | POA: Diagnosis not present

## 2017-05-09 DIAGNOSIS — Z1231 Encounter for screening mammogram for malignant neoplasm of breast: Secondary | ICD-10-CM

## 2017-05-09 LAB — UA/M W/RFLX CULTURE, ROUTINE
Bilirubin, UA: NEGATIVE
Glucose, UA: NEGATIVE
Ketones, UA: NEGATIVE
LEUKOCYTES UA: NEGATIVE
Nitrite, UA: NEGATIVE
PH UA: 6.5 (ref 5.0–7.5)
PROTEIN UA: NEGATIVE
RBC, UA: NEGATIVE
Specific Gravity, UA: <1.005 — ABNORMAL LOW (ref 1.005–1.030)
Urobilinogen, Ur: 0.2 mg/dL (ref 0.2–1.0)

## 2017-05-09 LAB — MICROSCOPIC EXAMINATION: RBC MICROSCOPIC, UA: NONE SEEN /HPF (ref 0–?)

## 2017-05-09 MED ORDER — PANTOPRAZOLE SODIUM 40 MG PO TBEC: 40.0000 mg | DELAYED_RELEASE_TABLET | Freq: Every day | ORAL | 3 refills | Status: DC

## 2017-05-09 MED ORDER — ESTRADIOL 1 MG PO TABS: 1.0000 mg | ORAL_TABLET | Freq: Every day | ORAL | 5 refills | Status: DC

## 2017-05-09 MED ORDER — RANITIDINE HCL 150 MG PO TABS: 150.0000 mg | ORAL_TABLET | Freq: Two times a day (BID) | ORAL | 3 refills | Status: DC

## 2017-05-09 NOTE — Progress Notes (Signed)
Subjective:   Lindsey Bautista is a 56 y.o. female who presents for an Initial Medicare Annual Wellness Visit.  Review of Systems     Cardiac Risk Factors include: smoking/ tobacco exposure     Objective:    Today's Vitals   05/09/17 1316  BP: 120/76  Pulse: 73  Resp: 16  Temp: 98.5 F (36.9 C)  Weight: 142 lb 3.2 oz (64.5 kg)  Height: 5\' 2"  (1.575 m)   Body mass index is 26.01 kg/m.   Current Medications (verified) Outpatient Encounter Prescriptions as of 05/09/2017  Medication Sig  . aspirin 81 MG tablet Take 81 mg by mouth daily.  [name] atorvastatin (LIPITOR) 80 MG tablet Take 1 tablet (80 mg total) by mouth daily.  . clonazePAM (KLONOPIN) 1 MG tablet Take 1 tablet (1 mg total) by mouth 2 (two) times daily.  [name] estradiol (ESTRACE) 1 MG tablet Take 1 tablet (1 mg total) by mouth daily.  [name] FLUoxetine (PROZAC) 20 MG capsule TAKE ONE CAPSULE BY MOUTH ONCE DAILY  . nitroGLYCERIN (NITROSTAT) 0.4 MG SL tablet Place 1 tablet (0.4 mg total) under the tongue every 5 (five) minutes as needed for chest pain.   No facility-administered encounter medications on file as of 05/09/2017.     Allergies (verified) Penicillin g and Iodinated diagnostic agents   History: Past Medical History:  Diagnosis Date  . COPD (chronic obstructive pulmonary disease) ([place])   . Depression   . GERD (gastroesophageal reflux disease)   . Hypotension   . IBS (irritable bowel syndrome)   . Right arm weakness   . Situational syncope   . TIA (transient ischemic attack)    Past Surgical History:  Procedure Laterality Date  . COLONOSCOPY WITH PROPOFOL N/A 11/22/2015   Procedure: COLONOSCOPY WITH PROPOFOL;  Surgeon: Manya Silvas, MD;  Location: [facility] ENDOSCOPY;  Service: Endoscopy;  Laterality: N/A;  . ESOPHAGOGASTRODUODENOSCOPY (EGD) WITH PROPOFOL N/A 11/22/2015   Procedure: ESOPHAGOGASTRODUODENOSCOPY (EGD) WITH PROPOFOL;  Surgeon: Manya Silvas, MD;  Location: [facility] ENDOSCOPY;  Service: Endoscopy;   Laterality: N/A;  . TONSILLECTOMY    . VAGINAL HYSTERECTOMY     Family History  Problem Relation Age of Onset  . Heart disease Unknown   . COPD Unknown   . Stroke Unknown   . Breast cancer Paternal Aunt 79   Social History   Occupational History  . Unemployed    Social History Main Topics  . Smoking status: Current Every Day Smoker    Packs/day: 1.00    Years: 25.00    Types: Cigarettes  . Smokeless tobacco: Never Used  . Alcohol use No  . Drug use: No  . Sexual activity: Not on file    Tobacco Counseling Ready to quit: No Counseling given: Yes   Activities of Daily Living In your present state of health, do you have any difficulty performing the following activities: 05/09/2017 11/17/2016  Hearing? N N  Vision? Y N  Difficulty concentrating or making decisions? Lindsey Bautista  Walking or climbing stairs? Y Y  Dressing or bathing? Y N  Doing errands, shopping? Y N  Preparing Food and eating ? N -  Using the Toilet? N -  In the past six months, have you accidently leaked urine? N -  Do you have problems with loss of bowel control? N -  Managing your Medications? N -  Managing your Finances? N -  Housekeeping or managing your Housekeeping? N -  Some recent data might be hidden  Immunizations and Health Maintenance Immunization History  Administered Date(s) Administered  . Influenza,inj,Quad PF,6+ Mos 06/21/2015, 05/09/2017  . Td 08/21/2001   There are no preventive care reminders to display for this patient.  Patient Care Team: [name], MD as PCP - General (Family Medicine) Abisogun, Domenica Reamer, MD as Consulting Physician (Endocrinology) Minna Merritts, MD as Consulting Physician (Cardiology)  Indicate any recent Medical Services you may have received from other than [facility] providers in the past year (date may be approximate).     Assessment:   This is a routine wellness examination for Lindsey Bautista.   Hearing/Vision screen Vision Screening Comments:  Doesn't have eye doctor currently   Dietary issues and exercise activities discussed: Current Exercise Habits: The patient does not participate in regular exercise at present, Exercise limited by: neurologic condition(s) (history of TIAs)  Goals    . Quit smoking / using tobacco          Smoking cessation discussed       Depression Screen PHQ 2/9 Scores 05/09/2017 12/04/2016 08/31/2016  PHQ - 2 Score 6 0 0  PHQ- 9 Score 21 - -    Fall Risk Fall Risk  05/09/2017 12/04/2016 08/31/2016  Falls in the past year? No Yes Yes  Number falls in past yr: - 1 2 or more  Injury with Fall? - Yes No  Follow up - Falls evaluation completed -    Cognitive Function:     6CIT Screen 05/09/2017  What Year? 0 points  What month? 0 points  What time? 0 points  Count back from 20 0 points  Months in reverse 0 points  Repeat phrase 6 points  Total Score 6    Screening Tests Health Maintenance  Topic Date Due  . TETANUS/TDAP  05/21/2018 (Originally 08/22/2011)  . MAMMOGRAM  01/12/2018  . COLONOSCOPY  11/21/2025  . INFLUENZA VACCINE  Completed  . Hepatitis C Screening  Completed  . HIV Screening  Completed      Plan:    I have personally reviewed and addressed the Medicare Annual Wellness questionnaire and have noted the following in the patient's chart:  A. Medical and social history B. Use of alcohol, tobacco or illicit drugs  C. Current medications and supplements D. Functional ability and status E.  Nutritional status F.  Physical activity G. Advance directives H. List of other physicians I.  Hospitalizations, surgeries, and ER visits in previous 12 months J.  [place] such as hearing and vision if needed, cognitive and depression L. Referrals and appointments   In addition, I have reviewed and discussed with patient certain preventive protocols, quality metrics, and best practice recommendations. A written personalized care plan for preventive services as well as  general preventive health recommendations were provided to patient.   Signed,  Tyler Aas, LPN Nurse Health Advisor   MD Recommendations: Patient complaining of odor in urine with no other symptoms. Also states she had two TIA's this weekend and took nitrostat last week due to chest pain.

## 2017-05-09 NOTE — Progress Notes (Signed)
BP 120/76   Pulse 73   Temp 98.5 F (36.9 C)   Ht 5\' 2"  (1.575 m)   Wt 142 lb (64.4 kg)   BMI 25.97 kg/m    Subjective:    Patient ID: Lindsey Bautista, female    DOB: Oct 27, 1960, 56 y.o.   MRN: [id number]  HPI: Lindsey Bautista is a 56 y.o. female  Chief Complaint  Patient presents with  . Urinary Tract Infection    has an odor  . Transient Ischemic Attack    thinks she had a couple this weekend   Pt presents with over a week of strong odor to her urine and now low back aching. No hx of kidney stones or UTIs. Denies fever, chills, dysuria, frequency. Has not tried anything OTC for sxs. When asked, does endorse eating a ton of asparagus lately.   Stopped taking her omeprazole due to concern for kidney issues that she saw on TV. Still having significant reflux symptoms. Has tried pepto bismol with some relief. Denies vomiting, melena, constipation.   States she continues to have these transient episodes of unilateral numbness and paralysis, severe migraines, syncope, and aphasia. Also has been having some tingling sensations in her scalp lately. Not currently followed by a Neurologist, went about 4 years ago when things initially began and she was thought to have had a CVA. Sxs not currently present.   Has severe anxiety and insomia that has been under good control with klonopin 1 mg BID for years. Feeling lately like it's not been enough, wanting to increase dose.   Past Medical History:  Diagnosis Date  . COPD (chronic obstructive pulmonary disease) ([place])   . Depression   . GERD (gastroesophageal reflux disease)   . Hypotension   . IBS (irritable bowel syndrome)   . Right arm weakness   . Situational syncope   . TIA (transient ischemic attack)    Social History   Social History  . Marital status: Married    Spouse name: Meilin Brosh  . Number of children: 2  . Years of education: 12   Occupational History  . Unemployed    Social History Main Topics  .  Smoking status: Current Every Day Smoker    Packs/day: 1.00    Years: 25.00    Types: Cigarettes  . Smokeless tobacco: Never Used  . Alcohol use No  . Drug use: No  . Sexual activity: Not on file   Other Topics Concern  . Not on file   Social History Narrative   Lives at home with husband.    Caffeine use: 1 cup coffee per day   Relevant past medical, surgical, family and social history reviewed and updated as indicated. Interim medical history since our last visit reviewed. Allergies and medications reviewed and updated.  Review of Systems  HENT: Negative.   Respiratory: Negative.   Cardiovascular: Negative.   Gastrointestinal:       Reflux  Genitourinary:       Urine odor  Musculoskeletal: Positive for back pain.  Neurological: Positive for syncope, speech difficulty, weakness, numbness and headaches.  Psychiatric/Behavioral: Positive for sleep disturbance. The patient is nervous/anxious.    Per HPI unless specifically indicated above     Objective:    BP 120/76   Pulse 73   Temp 98.5 F (36.9 C)   Ht 5\' 2"  (1.575 m)   Wt 142 lb (64.4 kg)   BMI 25.97 kg/m   Wt Readings from Last  3 Encounters:  05/09/17 142 lb (64.4 kg)  05/09/17 142 lb 3.2 oz (64.5 kg)  03/12/17 137 lb 2 oz (62.2 kg)    Physical Exam  Constitutional: She is oriented to person, place, and time. She appears well-developed and well-nourished. No distress.  HENT:  Head: Atraumatic.  Eyes: Pupils are equal, round, and reactive to light. Conjunctivae are normal.  Neck: Normal range of motion. Neck supple.  Cardiovascular: Normal rate and normal heart sounds.   Pulmonary/Chest: Effort normal and breath sounds normal. No respiratory distress.  Abdominal: Soft. Bowel sounds are normal. She exhibits no distension. There is no tenderness.  Musculoskeletal: Normal range of motion. She exhibits no tenderness (no CVA tenderness b/l).  Neurological: She is alert and oriented to person, place, and time.    Skin: Skin is warm and dry.  Psychiatric: She has a normal mood and affect. Her behavior is normal.  Nursing note and vitals reviewed.     Assessment & Plan:   Problem List Items Addressed This Visit      Digestive   Gastroesophageal reflux disease without esophagitis    Discussed risks vs benefits of PPI therapy, pt willing to try protonix and zantac prn. Discussed dietary modifications      Relevant Medications   ranitidine (ZANTAC) 150 MG tablet   pantoprazole (PROTONIX) 40 MG tablet     Other   Acute anxiety    Discussed importance of limiting BZD use, will not increase dose further from what it currently is at 1 mg BID prn. Pt agreeable.        Other Visit Diagnoses    Abnormal urine odor    -  Primary   U/A WNL today, discussed increasing water intake. Suspect odor is from the large amount of asparagus she's been eating lately.    Relevant Orders   UA/M w/rflx Culture, Routine (Completed)   Aphasia       Given abnormal and transient neurologic sxs, will refer to Neurology for further workup. Sxs not present currently. Strict return precautions to ER given   Relevant Orders   Ambulatory referral to Neurology       Follow up plan: Return for as scheduled.

## 2017-05-09 NOTE — Patient Instructions (Addendum)
Lindsey Bautista , Thank you for taking time to come for your Medicare Wellness Visit. I appreciate your ongoing commitment to your health goals. Please review the following plan we discussed and let me know if I can assist you in the future.   Screening recommendations/referrals: Colonoscopy: completed 11/22/2015 Mammogram: due now - Please call [phone] to schedule your mammogram.  Bone Density: due at 2 Recommended yearly ophthalmology/optometry visit for glaucoma screening and checkup Recommended yearly dental visit for hygiene and checkup  Vaccinations: Influenza vaccine: done today  Pneumococcal vaccine: due at 65 Tdap vaccine: declined today  Shingles vaccine: due at 60  Advanced directives: Advance directive discussed with you today. I have provided a copy for you to complete at home and have notarized. Once this is complete please bring a copy in to our office so we can scan it into your chart.  Conditions/risks identified: smoking cessation discussed   Next appointment: Follow up in one year for your annual wellness exam.   Preventive Care 40-64 Years, Female Preventive care refers to lifestyle choices and visits with your health care provider that can promote health and wellness. What does preventive care include?  A yearly physical exam. This is also called an annual well check.  Dental exams once or twice a year.  Routine eye exams. Ask your health care provider how often you should have your eyes checked.  Personal lifestyle choices, including:  Daily care of your teeth and gums.  Regular physical activity.  Eating a healthy diet.  Avoiding tobacco and drug use.  Limiting alcohol use.  Practicing safe sex.  Taking low-dose aspirin daily starting at age 88.  Taking vitamin and mineral supplements as recommended by your health care provider. What happens during an annual well check? The services and screenings done by your health care provider during your  annual well check will depend on your age, overall health, lifestyle risk factors, and family history of disease. Counseling  Your health care provider may ask you questions about your:  Alcohol use.  Tobacco use.  Drug use.  Emotional well-being.  Home and relationship well-being.  Sexual activity.  Eating habits.  Work and work Statistician.  Method of birth control.  Menstrual cycle.  Pregnancy history. Screening  You may have the following tests or measurements:  Height, weight, and BMI.  Blood pressure.  Lipid and cholesterol levels. These may be checked every 5 years, or more frequently if you are over 24 years old.  Skin check.  Lung cancer screening. You may have this screening every year starting at age 43 if you have a 30-pack-year history of smoking and currently smoke or have quit within the past 15 years.  Fecal occult blood test (FOBT) of the stool. You may have this test every year starting at age 17.  Flexible sigmoidoscopy or colonoscopy. You may have a sigmoidoscopy every 5 years or a colonoscopy every 10 years starting at age 35.  Hepatitis C blood test.  Hepatitis B blood test.  Sexually transmitted disease (STD) testing.  Diabetes screening. This is done by checking your blood sugar (glucose) after you have not eaten for a while (fasting). You may have this done every 1-3 years.  Mammogram. This may be done every 1-2 years. Talk to your health care provider about when you should start having regular mammograms. This may depend on whether you have a family history of breast cancer.  BRCA-related cancer screening. This may be done if you have a family history  of breast, ovarian, tubal, or peritoneal cancers.  Pelvic exam and Pap test. This may be done every 3 years starting at age 61. Starting at age 13, this may be done every 5 years if you have a Pap test in combination with an HPV test.  Bone density scan. This is done to screen for  osteoporosis. You may have this scan if you are at high risk for osteoporosis. Discuss your test results, treatment options, and if necessary, the need for more tests with your health care provider. Vaccines  Your health care provider may recommend certain vaccines, such as:  Influenza vaccine. This is recommended every year.  Tetanus, diphtheria, and acellular pertussis (Tdap, Td) vaccine. You may need a Td booster every 10 years.  Zoster vaccine. You may need this after age 43.  Pneumococcal 13-valent conjugate (PCV13) vaccine. You may need this if you have certain conditions and were not previously vaccinated.  Pneumococcal polysaccharide (PPSV23) vaccine. You may need one or two doses if you smoke cigarettes or if you have certain conditions. Talk to your health care provider about which screenings and vaccines you need and how often you need them. This information is not intended to replace advice given to you by your health care provider. Make sure you discuss any questions you have with your health care provider. Document Released: 09/03/2015 Document Revised: 04/26/2016 Document Reviewed: 06/08/2015 Elsevier Interactive Patient Education  2017 [place] Prevention in the Home Falls can cause injuries. They can happen to people of all ages. There are many things you can do to make your home safe and to help prevent falls. What can I do on the outside of my home?  Regularly fix the edges of walkways and driveways and fix any cracks.  Remove anything that might make you trip as you walk through a door, such as a raised step or threshold.  Trim any bushes or trees on the path to your home.  Use bright outdoor lighting.  Clear any walking paths of anything that might make someone trip, such as rocks or tools.  Regularly check to see if handrails are loose or broken. Make sure that both sides of any steps have handrails.  Any raised decks and porches should have  guardrails on the edges.  Have any leaves, snow, or ice cleared regularly.  Use sand or salt on walking paths during winter.  Clean up any spills in your garage right away. This includes oil or grease spills. What can I do in the bathroom?  Use night lights.  Install grab bars by the toilet and in the tub and shower. Do not use towel bars as grab bars.  Use non-skid mats or decals in the tub or shower.  If you need to sit down in the shower, use a plastic, non-slip stool.  Keep the floor dry. Clean up any water that spills on the floor as soon as it happens.  Remove soap buildup in the tub or shower regularly.  Attach bath mats securely with double-sided non-slip rug tape.  Do not have throw rugs and other things on the floor that can make you trip. What can I do in the bedroom?  Use night lights.  Make sure that you have a light by your bed that is easy to reach.  Do not use any sheets or blankets that are too big for your bed. They should not hang down onto the floor.  Have a firm chair that  has side arms. You can use this for support while you get dressed.  Do not have throw rugs and other things on the floor that can make you trip. What can I do in the kitchen?  Clean up any spills right away.  Avoid walking on wet floors.  Keep items that you use a lot in easy-to-reach places.  If you need to reach something above you, use a strong step stool that has a grab bar.  Keep electrical cords out of the way.  Do not use floor polish or wax that makes floors slippery. If you must use wax, use non-skid floor wax.  Do not have throw rugs and other things on the floor that can make you trip. What can I do with my stairs?  Do not leave any items on the stairs.  Make sure that there are handrails on both sides of the stairs and use them. Fix handrails that are broken or loose. Make sure that handrails are as long as the stairways.  Check any carpeting to make sure that  it is firmly attached to the stairs. Fix any carpet that is loose or worn.  Avoid having throw rugs at the top or bottom of the stairs. If you do have throw rugs, attach them to the floor with carpet tape.  Make sure that you have a light switch at the top of the stairs and the bottom of the stairs. If you do not have them, ask someone to add them for you. What else can I do to help prevent falls?  Wear shoes that:  Do not have high heels.  Have rubber bottoms.  Are comfortable and fit you well.  Are closed at the toe. Do not wear sandals.  If you use a stepladder:  Make sure that it is fully opened. Do not climb a closed stepladder.  Make sure that both sides of the stepladder are locked into place.  Ask someone to hold it for you, if possible.  Clearly mark and make sure that you can see:  Any grab bars or handrails.  First and last steps.  Where the edge of each step is.  Use tools that help you move around (mobility aids) if they are needed. These include:  Canes.  Walkers.  Scooters.  Crutches.  Turn on the lights when you go into a dark area. Replace any light bulbs as soon as they burn out.  Set up your furniture so you have a clear path. Avoid moving your furniture around.  If any of your floors are uneven, fix them.  If there are any pets around you, be aware of where they are.  Review your medicines with your doctor. Some medicines can make you feel dizzy. This can increase your chance of falling. Ask your doctor what other things that you can do to help prevent falls. This information is not intended to replace advice given to you by your health care provider. Make sure you discuss any questions you have with your health care provider. Document Released: 06/03/2009 Document Revised: 01/13/2016 Document Reviewed: 09/11/2014 Elsevier Interactive Patient Education  2017 [place].  Influenza (Flu) Vaccine (Inactivated or Recombinant): What You Need  to Know 1. Why get vaccinated? Influenza ("flu") is a contagious disease that spreads around the [place] every year, usually between October and May. Flu is caused by influenza viruses, and is spread mainly by coughing, sneezing, and close contact. Anyone can get flu. Flu strikes suddenly and can  last several days. Symptoms vary by age, but can include:  fever/chills  sore throat  muscle aches  fatigue  cough  headache  runny or stuffy nose  Flu can also lead to pneumonia and blood infections, and cause diarrhea and seizures in children. If you have a medical condition, such as heart or lung disease, flu can make it worse. Flu is more dangerous for some people. Infants and young children, people 50 years of age and older, pregnant women, and people with certain health conditions or a weakened immune system are at greatest risk. Each year thousands of people in the [place] die from flu, and many more are hospitalized. Flu vaccine can:  keep you from getting flu,  make flu less severe if you do get it, and  keep you from spreading flu to your family and other people. 2. Inactivated and recombinant flu vaccines A dose of flu vaccine is recommended every flu season. Children 6 months through 66 years of age may need two doses during the same flu season. Everyone else needs only one dose each flu season. Some inactivated flu vaccines contain a very small amount of a mercury-based preservative called thimerosal. Studies have not shown thimerosal in vaccines to be harmful, but flu vaccines that do not contain thimerosal are available. There is no live flu virus in flu shots. They cannot cause the flu. There are many flu viruses, and they are always changing. Each year a new flu vaccine is made to protect against three or four viruses that are likely to cause disease in the upcoming flu season. But even when the vaccine doesn't exactly match these viruses, it may still provide  some protection. Flu vaccine cannot prevent:  flu that is caused by a virus not covered by the vaccine, or  illnesses that look like flu but are not.  It takes about 2 weeks for protection to develop after vaccination, and protection lasts through the flu season. 3. Some people should not get this vaccine Tell the person who is giving you the vaccine:  If you have any severe, life-threatening allergies. If you ever had a life-threatening allergic reaction after a dose of flu vaccine, or have a severe allergy to any part of this vaccine, you may be advised not to get vaccinated. Most, but not all, types of flu vaccine contain a small amount of egg protein.  If you ever had Guillain-Barr Syndrome (also called GBS). Some people with a history of GBS should not get this vaccine. This should be discussed with your doctor.  If you are not feeling well. It is usually okay to get flu vaccine when you have a mild illness, but you might be asked to come back when you feel better.  4. Risks of a vaccine reaction With any medicine, including vaccines, there is a chance of reactions. These are usually mild and go away on their own, but serious reactions are also possible. Most people who get a flu shot do not have any problems with it. Minor problems following a flu shot include:  soreness, redness, or swelling where the shot was given  hoarseness  sore, red or itchy eyes  cough  fever  aches  headache  itching  fatigue  If these problems occur, they usually begin soon after the shot and last 1 or 2 days. More serious problems following a flu shot can include the following:  There may be a small increased risk of Guillain-Barre Syndrome (GBS) after inactivated  flu vaccine. This risk has been estimated at 1 or 2 additional cases per million people vaccinated. This is much lower than the risk of severe complications from flu, which can be prevented by flu vaccine.  Young children who  get the flu shot along with pneumococcal vaccine (PCV13) and/or DTaP vaccine at the same time might be slightly more likely to have a seizure caused by fever. Ask your doctor for more information. Tell your doctor if a child who is getting flu vaccine has ever had a seizure.  Problems that could happen after any injected vaccine:  People sometimes faint after a medical procedure, including vaccination. Sitting or lying down for about 15 minutes can help prevent fainting, and injuries caused by a fall. Tell your doctor if you feel dizzy, or have vision changes or ringing in the ears.  Some people get severe pain in the shoulder and have difficulty moving the arm where a shot was given. This happens very rarely.  Any medication can cause a severe allergic reaction. Such reactions from a vaccine are very rare, estimated at about 1 in a million doses, and would happen within a few minutes to a few hours after the vaccination. As with any medicine, there is a very remote chance of a vaccine causing a serious injury or death. The safety of vaccines is always being monitored. For more information, visit: http://www.aguilar.org/ 5. What if there is a serious reaction? What should I look for? Look for anything that concerns you, such as signs of a severe allergic reaction, very high fever, or unusual behavior. Signs of a severe allergic reaction can include hives, swelling of the face and throat, difficulty breathing, a fast heartbeat, dizziness, and weakness. These would start a few minutes to a few hours after the vaccination. What should I do?  If you think it is a severe allergic reaction or other emergency that can't wait, call 9-1-1 and get the person to the nearest hospital. Otherwise, call your doctor.  Reactions should be reported to the Vaccine Adverse Event Reporting System (VAERS). Your doctor should file this report, or you can do it yourself through the VAERS web site at www.vaers.SamedayNews.es,  or by calling (319) 822-6339. ? VAERS does not give medical advice. 6. The National Vaccine Injury Compensation Program The [employer] Vaccine Injury Compensation Program (VICP) is a federal program that was created to compensate people who may have been injured by certain vaccines. Persons who believe they may have been injured by a vaccine can learn about the program and about filing a claim by calling 9021019332 or visiting the [place] website at GoldCloset.com.ee. There is a time limit to file a claim for compensation. 7. How can I learn more?  Ask your healthcare provider. He or she can give you the vaccine package insert or suggest other sources of information.  Call your local or state health department.  Contact the Centers for Disease Control and Prevention (CDC): ? Call 218-543-8531 (1-800-CDC-INFO) or ? Visit CDC's website at https://gibson.com/ Vaccine Information Statement, Inactivated Influenza Vaccine (03/27/2014) This information is not intended to replace advice given to you by your health care provider. Make sure you discuss any questions you have with your health care provider. Document Released: 06/01/2006 Document Revised: 04/27/2016 Document Reviewed: 04/27/2016 Elsevier Interactive Patient Education  2017 [employer].

## 2017-05-12 NOTE — Patient Instructions (Signed)
Follow up as scheduled.  

## 2017-05-12 NOTE — Assessment & Plan Note (Signed)
Discussed risks vs benefits of PPI therapy, pt willing to try protonix and zantac prn. Discussed dietary modifications

## 2017-05-12 NOTE — Assessment & Plan Note (Signed)
Discussed importance of limiting BZD use, will not increase dose further from what it currently is at 1 mg BID prn. Pt agreeable.

## 2017-05-30 ENCOUNTER — Other Ambulatory Visit: Payer: Self-pay | Admitting: Family Medicine

## 2017-05-30 NOTE — Telephone Encounter (Signed)
Patient requesting refill for clonazepam on [facility] on [address].   Pt. Saw Merrie Roof 05/09/2017 and will be seeing her for upcoming appt. On 08/08/2017.  Please Advise.  Thank you

## 2017-05-31 ENCOUNTER — Telehealth: Payer: Self-pay | Admitting: Family Medicine

## 2017-05-31 NOTE — Telephone Encounter (Addendum)
Faxed patients clonazepam to [facility]

## 2017-05-31 NOTE — Telephone Encounter (Signed)
Looks like Dr.Crissman already sent this in. Please call pt back and let her know

## 2017-06-10 ENCOUNTER — Emergency Department: Payer: Medicare Other

## 2017-06-10 ENCOUNTER — Emergency Department
Admission: EM | Admit: 2017-06-10 | Discharge: 2017-06-10 | Disposition: A | Discharge status: Home / Self Care | Payer: Medicare Other | Attending: Emergency Medicine | Admitting: Emergency Medicine

## 2017-06-10 DIAGNOSIS — J449 Chronic obstructive pulmonary disease, unspecified: Secondary | ICD-10-CM | POA: Insufficient documentation

## 2017-06-10 DIAGNOSIS — R7989 Other specified abnormal findings of blood chemistry: Secondary | ICD-10-CM | POA: Insufficient documentation

## 2017-06-10 DIAGNOSIS — R609 Edema, unspecified: Secondary | ICD-10-CM | POA: Insufficient documentation

## 2017-06-10 DIAGNOSIS — F17210 Nicotine dependence, cigarettes, uncomplicated: Secondary | ICD-10-CM | POA: Diagnosis not present

## 2017-06-10 DIAGNOSIS — R079 Chest pain, unspecified: Secondary | ICD-10-CM

## 2017-06-10 DIAGNOSIS — F419 Anxiety disorder, unspecified: Secondary | ICD-10-CM | POA: Diagnosis not present

## 2017-06-10 DIAGNOSIS — Z8673 Personal history of transient ischemic attack (TIA), and cerebral infarction without residual deficits: Secondary | ICD-10-CM | POA: Diagnosis not present

## 2017-06-10 DIAGNOSIS — R0602 Shortness of breath: Secondary | ICD-10-CM | POA: Diagnosis not present

## 2017-06-10 DIAGNOSIS — Z7982 Long term (current) use of aspirin: Secondary | ICD-10-CM | POA: Diagnosis not present

## 2017-06-10 DIAGNOSIS — Z79899 Other long term (current) drug therapy: Secondary | ICD-10-CM | POA: Diagnosis not present

## 2017-06-10 DIAGNOSIS — R0600 Dyspnea, unspecified: Secondary | ICD-10-CM | POA: Diagnosis not present

## 2017-06-10 LAB — CBC
HEMATOCRIT: 40.4 % (ref 35.0–47.0)
HEMOGLOBIN: 13.6 g/dL (ref 12.0–16.0)
MCH: 29.1 pg (ref 26.0–34.0)
MCHC: 33.7 g/dL (ref 32.0–36.0)
MCV: 86.3 fL (ref 80.0–100.0)
Platelets: 305 10*3/uL (ref 150–440)
RBC: 4.68 MIL/uL (ref 3.80–5.20)
RDW: 13.7 % (ref 11.5–14.5)
WBC: 5.4 10*3/uL (ref 3.6–11.0)

## 2017-06-10 LAB — BASIC METABOLIC PANEL
Anion gap: 3 — ABNORMAL LOW (ref 5–15)
BUN: 9 mg/dL (ref 6–20)
CHLORIDE: 104 mmol/L (ref 101–111)
CO2: 27 mmol/L (ref 22–32)
Calcium: 9.1 mg/dL (ref 8.9–10.3)
Creatinine, Ser: 0.86 mg/dL (ref 0.44–1.00)
GFR calc Af Amer: >60 mL/min (ref >60)
GFR calc non Af Amer: >60 mL/min (ref >60)
Glucose, Bld: 88 mg/dL (ref 65–99)
POTASSIUM: 3.9 mmol/L (ref 3.5–5.1)
SODIUM: 134 mmol/L — AB (ref 135–145)

## 2017-06-10 LAB — TROPONIN I
Troponin I: <0.03 ng/mL (ref <0.03)
Troponin I: <0.03 ng/mL (ref <0.03)

## 2017-06-10 LAB — FIBRIN DERIVATIVES D-DIMER (ARMC ONLY): Fibrin derivatives D-dimer (ARMC): 520.45 ng/mL (FEU) — ABNORMAL HIGH (ref 0.00–499.00)

## 2017-06-10 MED ORDER — LORAZEPAM 2 MG/ML IJ SOLN
0.5000 mg | Freq: Once | INTRAMUSCULAR | Status: AC
Start: 2017-06-10 — End: 2017-06-10
  Administered 2017-06-10: 0.5 mg via INTRAVENOUS
  Filled 2017-06-10: qty 1

## 2017-06-10 NOTE — ED Provider Notes (Signed)
[facility] Emergency Department Provider Note ____________________________________________   I have reviewed the triage vital signs and the triage nursing note.  HISTORY  Chief Complaint Chest Pain   Historian Patient and significant other  HPI Lindsey Bautista is a 56 y.o. female with a history of COPD and acid indigestion, no known coronary disease, also has anxiety and depression, presents due to feeling of chest tightness and mild shortness of breath.  She woke up with this this morning.  No pleuritic chest pain.  She initially tried albuterol inhaler and that did not seem to help and then significant other states that she seem to go into a panic attack.  Patient states that this did not really feel like a panic attack to her.  Although she does report that she does have significant anxiety.  No fever or coughing.  No abdominal pain.  This does not feel like indigestion to her.  States that she follows with Dr. Rockey Situ cardiology - has done prior stress test in the past.    Past Medical History:  Diagnosis Date  . COPD (chronic obstructive pulmonary disease) ([place])   . Depression   . GERD (gastroesophageal reflux disease)   . Hypotension   . IBS (irritable bowel syndrome)   . Right arm weakness   . Situational syncope   . TIA (transient ischemic attack)     Patient Active Problem List   Diagnosis Date Noted  . Right sided weakness 11/16/2016  . Conversion disorder with mixed symptoms 03/30/2016  . Abdominal aortic atherosclerosis ([place]) 12/29/2015  . Adrenal gland cyst ([place]) 12/29/2015  . Anxiety state   . Syncope and collapse   . COPD (chronic obstructive pulmonary disease) ([place]) 12/13/2015  . Chest pain 12/13/2015  . Gastroesophageal reflux disease without esophagitis   . PAD (peripheral artery disease) ([place]) 10/22/2015  . Hyperlipidemia 10/22/2015  . Cervical paraspinal muscle spasm 09/30/2015  . Benzodiazepine dependence, continuous ([place])  09/08/2015  . Acute anxiety 06/07/2015  . Depression 06/07/2015  . Situational syncope 06/07/2015  . Smoker 04/21/2015    Past Surgical History:  Procedure Laterality Date  . COLONOSCOPY WITH PROPOFOL N/A 11/22/2015   Procedure: COLONOSCOPY WITH PROPOFOL;  Surgeon: Manya Silvas, MD;  Location[facility] ENDOSCOPY;  Service: Endoscopy;  Laterality: N/A;  . ESOPHAGOGASTRODUODENOSCOPY (EGD) WITH PROPOFOL N/A 11/22/2015   Procedure: ESOPHAGOGASTRODUODENOSCOPY (EGD) WITH PROPOFOL;  Surgeon: Manya Silvas, MD;  Location: [facility] ENDOSCOPY;  Service: Endoscopy;  Laterality: N/A;  . TONSILLECTOMY    . VAGINAL HYSTERECTOMY      Prior to Admission medications   Medication Sig Start Date End Date Taking? Authorizing Provider  aspirin 81 MG tablet Take 81 mg by mouth daily.    [provider]  atorvastatin (LIPITOR) 80 MG tablet Take 1 tablet (80 mg total) by mouth daily. 04/14/16   Minna Merritts, MD  clonazePAM (KLONOPIN) 1 MG tablet TAKE 1 TABLET BY MOUTH TWICE DAILY 05/30/17   [name], MD  estradiol (ESTRACE) 1 MG tablet Take 1 tablet (1 mg total) by mouth daily. 05/09/17   Volney American, PA-C  FLUoxetine (PROZAC) 20 MG capsule TAKE ONE CAPSULE BY MOUTH ONCE DAILY 04/09/17   [name], MD  nitroGLYCERIN (NITROSTAT) 0.4 MG SL tablet Place 1 tablet (0.4 mg total) under the tongue every 5 (five) minutes as needed for chest pain. 12/04/16   [name], MD  pantoprazole (PROTONIX) 40 MG tablet Take 1 tablet (40 mg total) by mouth  daily. 05/09/17   Volney American, PA-C  ranitidine (ZANTAC) 150 MG tablet Take 1 tablet (150 mg total) by mouth 2 (two) times daily. 05/09/17   Volney American, PA-C    Allergies  Allergen Reactions  . Penicillin G Anaphylaxis and Other (See Comments)    Has patient had a PCN reaction causing immediate rash, facial/tongue/throat swelling, SOB or lightheadedness with hypotension: [id number]} Has patient had a PCN reaction  causing severe rash involving mucus membranes or skin necrosis: no:[id number]} Has patient had a PCN reaction that required hospitalization no:[id number]} Has patient had a PCN reaction occurring within the last 10 years: [id number]} If all of the above answers are "NO", then may proceed with Cephalosporin use.   . Iodinated Diagnostic Agents Rash and Nausea And Vomiting    Family History  Problem Relation Age of Onset  . Heart disease Unknown   . COPD Unknown   . Stroke Unknown   . Breast cancer Paternal Aunt 110    Social History Social History  Substance Use Topics  . Smoking status: Current Every Day Smoker    Packs/day: 1.00    Years: 25.00    Types: Cigarettes  . Smokeless tobacco: Never Used  . Alcohol use No    Review of Systems  Constitutional: Negative for fever. Eyes: Negative for visual changes. ENT: Negative for sore throat. Cardiovascular: Negative for chest pain,some chest pressure, left side and central. Respiratory: Mild shortness of breath. Gastrointestinal: Negative for abdominal pain, vomiting and diarrhea. Genitourinary: Negative for dysuria. Musculoskeletal: Negative for back pain. Skin: Negative for rash. Neurological: Negative for headache.  ____________________________________________   PHYSICAL EXAM:  VITAL SIGNS: ED Triage Vitals [06/10/17 1230]  Enc Vitals Group     BP 132/72     Pulse Rate (!) 58     Resp 11     Temp 98.4 F (36.9 C)     Temp Source Oral     SpO2 100 %     Weight 150 lb (68 kg)     Height 5\' 1"  (1.549 m)     Head Circumference      Peak Flow      Pain Score      Pain Loc      Pain Edu?      Excl. in [place]?      Constitutional: Alert and oriented. Well appearing and in no distress.  Somewhat anxious HEENT   Head: Normocephalic and atraumatic.      Eyes: Conjunctivae are normal. Pupils equal and round.       Ears:         Nose: No congestion/rhinnorhea.   Mouth/Throat: Mucous membranes are moist.    Neck: No stridor. Cardiovascular/Chest: Normal rate, regular rhythm.  No murmurs, rubs, or gallops. Respiratory: Normal respiratory effort without tachypnea nor retractions. Breath sounds are clear and equal bilaterally. No wheezes/rales/rhonchi. Gastrointestinal: Soft. No distention, no guarding, no rebound. Nontender.    Genitourinary/rectal:Deferred Musculoskeletal: Nontender with normal range of motion in all extremities. No joint effusions.  No lower extremity tenderness.  No edema. Neurologic:  Normal speech and language. No gross or focal neurologic deficits are appreciated. Skin:  Skin is warm, dry and intact. No rash noted. Psychiatric: Mood and affect are normal. Speech and behavior are normal. Patient exhibits appropriate insight and judgment.   ____________________________________________  LABS (pertinent positives/negatives) I, Lisa Roca, MD the attending physician have reviewed the labs noted below.  Labs Reviewed  BASIC METABOLIC PANEL - Abnormal; Notable for  the following:       Result Value   Sodium 134 (*)    Anion gap 3 (*)    All other components within normal limits  CBC  TROPONIN I  FIBRIN DERIVATIVES D-DIMER ([facility] ONLY)  TROPONIN I    ____________________________________________    EKG I, Lisa Roca, MD, the attending physician have personally viewed and interpreted all ECGs.  60 bpm.  Normal sinus rhythm.  Narrow QS per normal axis.  Normal ST and T wave  ____________________________________________  RADIOLOGY All Xrays were viewed by me.  Imaging interpreted by Radiologist, and I, Lisa Roca, MD the attending physician have reviewed the radiologist interpretation noted below.  CXR:  IMPRESSION: No active cardiopulmonary disease. __________________________________________  PROCEDURES  Procedure(s) performed: None  Critical Care performed: None  ____________________________________________  No current facility-administered  medications on file prior to encounter.    Current Outpatient Prescriptions on File Prior to Encounter  Medication Sig Dispense Refill  . aspirin 81 MG tablet Take 81 mg by mouth daily.    [name] atorvastatin (LIPITOR) 80 MG tablet Take 1 tablet (80 mg total) by mouth daily. 30 tablet 11  . clonazePAM (KLONOPIN) 1 MG tablet TAKE 1 TABLET BY MOUTH TWICE DAILY 60 tablet 2  . estradiol (ESTRACE) 1 MG tablet Take 1 tablet (1 mg total) by mouth daily. 30 tablet 5  . FLUoxetine (PROZAC) 20 MG capsule TAKE ONE CAPSULE BY MOUTH ONCE DAILY 30 capsule 12  . nitroGLYCERIN (NITROSTAT) 0.4 MG SL tablet Place 1 tablet (0.4 mg total) under the tongue every 5 (five) minutes as needed for chest pain. 50 tablet 3  . pantoprazole (PROTONIX) 40 MG tablet Take 1 tablet (40 mg total) by mouth daily. 30 tablet 3  . ranitidine (ZANTAC) 150 MG tablet Take 1 tablet (150 mg total) by mouth 2 (two) times daily. 60 tablet 3    ____________________________________________  ED COURSE / ASSESSMENT AND PLAN  Pertinent labs & imaging results that were available during my care of the patient were reviewed by me and considered in my medical decision making (see chart for details).   Patient is here for chest discomfort episode which is either chest pressure or mild shortness of breath.  EKG and initial evaluation seem overall reassuring, and I think less likely for acute cardiac source.  Patient does not feel like this is similar to GERD.  She is quite anxious and significant other thinks that she probably had a panic attack.  She does report being very anxious and we discussed ways to cope with anxiety.  In any case, I am going to check a d-dimer although I feel like she is low risk for unlikely given no tachycardia, hypoxia, pleuritic chest pain, swelling of the legs, or fever.  I will retreat troponin in 3 hours.  If negative and reassuring, will plan to discharge home.  Patient care to be transferred to Dr. Jacqualine Code at shift  change 3 PM.  Patient awaiting repeat troponin.  If reassuring, patient may be discharged with my prepared discharge instructions.  DIFFERENTIAL DIAGNOSIS: Differential diagnosis includes, but is not limited to, ACS, aortic dissection, pulmonary embolism, cardiac tamponade, pneumothorax, pneumonia, pericarditis/myocarditis, GI-related causes including esophagitis/gastritis, and musculoskeletal chest wall pain.    CONSULTATIONS:   None  Patient / Family / Caregiver informed of clinical course, medical decision-making process, and agree with plan.   I discussed return precautions, follow-up instructions, and discharge instructions with patient and/or family.  Discharge Instructions : You are evaluated  for chest discomfort, and although no certain cause was found, your exam and evaluation are overall reassuring in the emergency department today.  Return to the emergency department immediately for any worsening condition including trouble breathing, cannot catch breath, chest pain with breathing, fever, vomiting blood, abdominal pain, fever, or any other symptoms concerning to you.  ___________________________________________   FINAL CLINICAL IMPRESSION(S) / ED DIAGNOSES   Final diagnoses:  Nonspecific chest pain  Anxiety              Note: This dictation was prepared with Dragon dictation. Any transcriptional errors that result from this process are unintentional    Lisa Roca, MD 06/15/17 1511

## 2017-06-10 NOTE — ED Triage Notes (Signed)
Pt reports sob and and chest pain starting after waking. Reports central chest pain, describes it as pressure. History of angina, anxiety, and tia. Alert and oriented. VS stable

## 2017-06-10 NOTE — ED Provider Notes (Signed)
Discussed risks and benefits and reasoning for testing for PE with the patient.  The patient's age-adjusted d-dimer is negative.  When I discussed with her she is not having any pleuritic chest pain, no active ongoing symptoms such as chest pain or shortness of breath.  She has not had any tachycardia, she has no examination findings to suggest PE, her oxygen saturation is 100%.  She has not had a previous PE in the past.  I will evaluate with venous ultrasounds to exclude lower extremity DVT.  Given the use of an age-adjusted d-dimer, I did discuss with the patient further testing options such as a CT angiogram though I would recommend against it and after discussing risks and benefits and her known allergy to iodinated contrast we will not perform CT angiogram unless a DVT is detected in her lower extremities, additionally her age-adjusted d-dimer is negative.  ----------------------------------------- 7:11 PM on 06/10/2017 -----------------------------------------  Patient comfortable with plan to follow-up with her primary care doctor and cardiologist. Return precautions and treatment recommendations and follow-up discussed with the patient who is agreeable with the plan.    Delman Kitten, MD 06/10/17 1911

## 2017-06-10 NOTE — Discharge Instructions (Signed)
You are evaluated for chest discomfort, and although no certain cause was found, your exam and evaluation are overall reassuring in the emergency department today.  Return to the emergency department immediately for any worsening condition including trouble breathing, cannot catch breath, chest pain with breathing, fever, vomiting blood, abdominal pain, fever, or any other symptoms concerning to you.

## 2017-06-26 ENCOUNTER — Ambulatory Visit (INDEPENDENT_AMBULATORY_CARE_PROVIDER_SITE_OTHER): Payer: Medicare Other | Admitting: Nurse Practitioner

## 2017-06-26 ENCOUNTER — Encounter: Payer: Self-pay | Admitting: Nurse Practitioner

## 2017-06-26 VITALS — BP 120/70 | HR 63 | Ht 61.0 in | Wt 142.0 lb

## 2017-06-26 DIAGNOSIS — R0789 Other chest pain: Secondary | ICD-10-CM | POA: Diagnosis not present

## 2017-06-26 DIAGNOSIS — I639 Cerebral infarction, unspecified: Secondary | ICD-10-CM

## 2017-06-26 NOTE — Patient Instructions (Addendum)
Medication Instructions:  Your physician recommends that you continue on your current medications as directed. Please refer to the Current Medication list given to you today.   Labwork: none  Testing/Procedures: none  Follow-Up: Your physician wants you to follow-up in: 6 months with Dr. Rockey Situ.  You will receive a reminder letter in the mail two months in advance. If you don't receive a letter, please call our office to schedule the follow-up appointment.   Any Other Special Instructions Will Be Listed Below (If Applicable).     If you need a refill on your cardiac medications before your next appointment, please call your pharmacy.   Steps to Quit Smoking Smoking tobacco can be bad for your health. It can also affect almost every organ in your body. Smoking puts you and people around you at risk for many serious long-lasting (chronic) diseases. Quitting smoking is hard, but it is one of the best things that you can do for your health. It is never too late to quit. What are the benefits of quitting smoking? When you quit smoking, you lower your risk for getting serious diseases and conditions. They can include:  Lung cancer or lung disease.  Heart disease.  Stroke.  Heart attack.  Not being able to have children (infertility).  Weak bones (osteoporosis) and broken bones (fractures).  If you have coughing, wheezing, and shortness of breath, those symptoms may get better when you quit. You may also get sick less often. If you are pregnant, quitting smoking can help to lower your chances of having a baby of low birth weight. What can I do to help me quit smoking? Talk with your doctor about what can help you quit smoking. Some things you can do (strategies) include:  Quitting smoking totally, instead of slowly cutting back how much you smoke over a period of time.  Going to in-person counseling. You are more likely to quit if you go to many counseling sessions.  Using  resources and support systems, such as: ? Database administrator with a Social worker. ? Phone quitlines. ? Careers information officer. ? Support groups or group counseling. ? Text messaging programs. ? Mobile phone apps or applications.  Taking medicines. Some of these medicines may have nicotine in them. If you are pregnant or breastfeeding, do not take any medicines to quit smoking unless your doctor says it is okay. Talk with your doctor about counseling or other things that can help you.  Talk with your doctor about using more than one strategy at the same time, such as taking medicines while you are also going to in-person counseling. This can help make quitting easier. What things can I do to make it easier to quit? Quitting smoking might feel very hard at first, but there is a lot that you can do to make it easier. Take these steps:  Talk to your family and friends. Ask them to support and encourage you.  Call phone quitlines, reach out to support groups, or work with a Social worker.  Ask people who smoke to not smoke around you.  Avoid places that make you want (trigger) to smoke, such as: ? Bars. ? Parties. ? Smoke-break areas at work.  Spend time with people who do not smoke.  Lower the stress in your life. Stress can make you want to smoke. Try these things to help your stress: ? Getting regular exercise. ? Deep-breathing exercises. ? Yoga. ? Meditating. ? Doing a body scan. To do this, close your eyes, focus  on one area of your body at a time from head to toe, and notice which parts of your body are tense. Try to relax the muscles in those areas.  Download or buy apps on your mobile phone or tablet that can help you stick to your quit plan. There are many free apps, such as QuitGuide from the [employer] Office manager for Disease Control and Prevention). You can find more support from smokefree.gov and other websites.  This information is not intended to replace advice given to you by your health  care provider. Make sure you discuss any questions you have with your health care provider. Document Released: 06/03/2009 Document Revised: 04/04/2016 Document Reviewed: 12/22/2014 Elsevier Interactive Patient Education  2018 [employer].

## 2017-06-26 NOTE — Progress Notes (Signed)
Office Visit    Patient Name: Lindsey Bautista Date of Encounter: 06/26/2017  Primary Care Provider:  [name], MD Primary Cardiologist:  Johnny Bridge, MD   Chief Complaint    56 y/o ? with a history of atypical chest pain, anxiety, depression, ongoing tobacco abuse, chronic dyspnea on exertion, stroke symptoms in April 2015, conversion disorder, and noncompliance, who presents for follow-up.  Past Medical History    Past Medical History:  Diagnosis Date  . Atypical chest pain    a. 08/2016 Neg MV.  [name] COPD (chronic obstructive pulmonary disease) ([place])   . Depression   . Diastolic dysfunction    a. 10/2016 Echo: EF 60-65%, no rwma, Gr1 DD, mild MR, nl LA size, nl RV fxn.  [name] GERD (gastroesophageal reflux disease)   . Hypotension   . IBS (irritable bowel syndrome)   . Right arm weakness   . Situational syncope   . TIA (transient ischemic attack)    Past Surgical History:  Procedure Laterality Date  . TONSILLECTOMY    . VAGINAL HYSTERECTOMY      Allergies  Allergies  Allergen Reactions  . Penicillin G Anaphylaxis and Other (See Comments)    Has patient had a PCN reaction causing immediate rash, facial/tongue/throat swelling, SOB or lightheadedness with hypotension: [id number]} Has patient had a PCN reaction causing severe rash involving mucus membranes or skin necrosis: no:[id number]} Has patient had a PCN reaction that required hospitalization no:[id number]} Has patient had a PCN reaction occurring within the last 10 years: [id number]} If all of the above answers are "NO", then may proceed with Cephalosporin use.   . Iodinated Diagnostic Agents Rash and Nausea And Vomiting    History of Present Illness    56 year old female with the above past medical history including a long history of atypical, constant chest pain with negative Myoview in January 2018,  Depression, anxiety, situational syncope, strokelike symptoms with diagnosis of conversion disorder, GERD,  ongoing tobacco abuse, COPD, and diastolic dysfunction.  She was recently seen in the emergency department in the setting of chest pain and dyspnea.  She describes the chest pain as 7/10 heaviness which is constant in nature over the past 6-12 months.  This does not change with exertion or palpation.  It is somewhat worse with deep breathing.  It has not changed in character in some time.  As above, she did have negative stress testing in the setting of chest pain in January 2018.  She had an echocardiogram in March of this year which showed normal LV function with grade 1 diastolic dysfunction.  When she was seen in the emergency department recently, cardiac markers were negative.  D-dimer was minimally elevated and as she was not hypoxic, tachycardic, or in any hemodynamic distress, it was felt that she was low risk for pulmonary embolus and thus additional evaluation was deferred.  Since her ER visit, she is continued to have constant chest discomfort as described above.  She denies PND, orthopnea, dizziness, syncope, edema, or early satiety.  She continues to smoke at least one pack per day which is down from 2 packs/day just a few years ago.  Home Medications    Prior to Admission medications   Medication Sig Start Date End Date Taking? Authorizing Provider  aspirin 81 MG tablet Take 81 mg by mouth daily.   Yes [provider]  clonazePAM (KLONOPIN) 1 MG tablet TAKE 1 TABLET BY MOUTH TWICE DAILY 05/30/17  Yes Crissman, Jeannette How, MD  estradiol (ESTRACE) 1 MG tablet Take 1 tablet (1 mg total) by mouth daily. 05/09/17  Yes Volney American, PA-C  FLUoxetine (PROZAC) 20 MG capsule TAKE ONE CAPSULE BY MOUTH ONCE DAILY 04/09/17  Yes Crissman, Jeannette How, MD  nitroGLYCERIN (NITROSTAT) 0.4 MG SL tablet Place 1 tablet (0.4 mg total) under the tongue every 5 (five) minutes as needed for chest pain. 12/04/16  Yes Crissman, Jeannette How, MD  pantoprazole (PROTONIX) 40 MG tablet Take 1 tablet (40 mg total) by  mouth daily. 05/09/17  Yes Volney American, PA-C    Review of Systems    As above, she has constant midsternal chest discomfort that is slightly worse with deep breathing.  She rates this as 7/10 times at least 6 months.  She has chronic dyspnea on exertion but denies PND, orthopnea, dizziness, syncope, edema, or early satiety.  She is known to have anxiety and depression..  All other systems reviewed and are otherwise negative except as noted above.  Physical Exam    VS:  BP 120/70 (BP Location: Left Arm, Patient Position: Sitting, Cuff Size: Normal)   Pulse 63   Ht 5\' 1"  (1.549 m)   Wt 142 lb (64.4 kg)   BMI 26.83 kg/m  , BMI Body mass index is 26.83 kg/m. GEN: Well nourished, well developed, in no acute distress.  HEENT: normal.  Neck: Supple, no JVD, carotid bruits, or masses. Cardiac: RRR, no murmurs, rubs, or gallops. No clubbing, cyanosis, edema.  Radials/DP/PT 2+ and equal bilaterally.  Respiratory:  Respirations regular and unlabored, diminished breath sounds bilaterally. GI: Soft, nontender, nondistended, BS + x 4. MS: no deformity or atrophy. Skin: warm and dry, no rash. Neuro:  Strength and sensation are intact. Psych: Normal affect.  Accessory Clinical Findings    ECG -regular sinus rhythm, 63, no acute ST or T changes.  Assessment & Plan    1.  Atypical/constant midsternal chest pain: Patient with a long history of constant chest pain with negative Myoview in January, normal LV function by echo in March, and normal troponins despite persistent symptoms more recently when seen in the emergency department.  Her pain is slightly worse with deep breathing but is otherwise not provoked by palpation or position changes.  I provided reassurance in the setting of atypical symptoms with previous normal workup.  I did recommend she follow-up with GI and continue PPI therapy.  2.  Tobacco abuse: Patient continues to smoke 1 pack/day.  She does not see the connection  between her smoking and chronic dyspnea.  I recommended that she quit smoking.  She is not yet ready to quit.  3.  GERD: Question contribution to symptoms over the past 6+ months.  Continue PPI therapy.  Follow-up with gastroenterology.  4.  Hyperlipidemia: She is prescribed Lipitor but has not been taking this.  She is not interested in trying a statin.  5.  Disposition: Follow-up with Dr. Rockey Situ in 6 months or sooner if necessary.   Murray Hodgkins, NP 06/26/2017, 5:32 PM

## 2017-07-05 ENCOUNTER — Other Ambulatory Visit: Payer: Self-pay | Admitting: Cardiovascular Disease

## 2017-07-06 ENCOUNTER — Telehealth: Payer: Self-pay | Admitting: Family Medicine

## 2017-07-06 MED ORDER — AZITHROMYCIN 250 MG PO TABS
ORAL_TABLET | ORAL | 0 refills | Status: DC
Start: 2017-07-06 — End: 2017-08-29

## 2017-07-06 NOTE — Telephone Encounter (Signed)
Needs appt  Copied from [place] [id number]. Topic: Inquiry >> Jul 06, 2017  9:47 AM Lindsey Bautista, NT wrote: Reason for CRM: pt is calling to see if doctor will call her in antibiotic for coughing and congestion. States she has a low grade fever of 99.6 and would like nurse to call her back  >> Jul 06, 2017 11:58 AM Don Perking M wrote: Does this pt need an appt or is this something that can be handled without coming in?

## 2017-07-06 NOTE — Telephone Encounter (Signed)
Message relayed to patient. Verbalized understanding and denied questions.   

## 2017-07-06 NOTE — Telephone Encounter (Signed)
rx sent

## 2017-07-16 ENCOUNTER — Institutional Professional Consult (permissible substitution): Payer: Medicare Other | Admitting: Neurology

## 2017-08-08 ENCOUNTER — Ambulatory Visit: Payer: Medicaid Other | Admitting: Family Medicine

## 2017-08-29 ENCOUNTER — Ambulatory Visit (INDEPENDENT_AMBULATORY_CARE_PROVIDER_SITE_OTHER): Payer: Medicare Other | Admitting: Family Medicine

## 2017-08-29 ENCOUNTER — Encounter: Payer: Self-pay | Admitting: Family Medicine

## 2017-08-29 DIAGNOSIS — M797 Fibromyalgia: Secondary | ICD-10-CM | POA: Diagnosis not present

## 2017-08-29 DIAGNOSIS — F411 Generalized anxiety disorder: Secondary | ICD-10-CM | POA: Diagnosis not present

## 2017-08-29 MED ORDER — CLONAZEPAM 1 MG PO TABS: 1.0000 mg | ORAL_TABLET | Freq: Two times a day (BID) | ORAL | 2 refills | Status: DC

## 2017-08-29 NOTE — Assessment & Plan Note (Signed)
Discuss anxiety care and treatment will continue clonazepam 1 mg twice a day

## 2017-08-29 NOTE — Assessment & Plan Note (Signed)
Discussed fibromyalgia with patient as has done previously in the emergency room patient will exercise try to get better sleep and better lifestyle habits for improving fibromyalgia.

## 2017-08-29 NOTE — Progress Notes (Signed)
BP 123/74   Pulse 68   Wt 144 lb (65.3 kg)   SpO2 97%   BMI 27.21 kg/m    Subjective:    Patient ID: Lindsey Bautista, female    DOB: 12-Nov-1960, 57 y.o.   MRN: [id number]  HPI: Lindsey Bautista is a 57 y.o. female  Chief Complaint  Patient presents with  . Follow-up  . Medication Refill    Klonopin   Patient follow-up chronic anxiety takes clonazepam 1 mg twice a day with fair relief. Further evaluation through the emergency room and here have essentially diagnosed fibromyalgia for the patient. Other condition stable has not started back on Lipitor yet because of myalgias but on review with the patient myalgias occur off medication the same as a do only medication.  Relevant past medical, surgical, family and social history reviewed and updated as indicated. Interim medical history since our last visit reviewed. Allergies and medications reviewed and updated.  Review of Systems  Constitutional: Negative.   Respiratory: Negative.   Cardiovascular: Negative.     Per HPI unless specifically indicated above     Objective:    BP 123/74   Pulse 68   Wt 144 lb (65.3 kg)   SpO2 97%   BMI 27.21 kg/m   Wt Readings from Last 3 Encounters:  08/29/17 144 lb (65.3 kg)  06/26/17 142 lb (64.4 kg)  06/10/17 150 lb (68 kg)    Physical Exam  Constitutional: She is oriented to person, place, and time. She appears well-developed and well-nourished.  HENT:  Head: Normocephalic and atraumatic.  Eyes: Conjunctivae and EOM are normal.  Neck: Normal range of motion.  Cardiovascular: Normal rate, regular rhythm and normal heart sounds.  Pulmonary/Chest: Effort normal and breath sounds normal.  Musculoskeletal: Normal range of motion.  Neurological: She is alert and oriented to person, place, and time.  Skin: No erythema.  Psychiatric: She has a normal mood and affect. Her behavior is normal. Judgment and thought content normal.    Results for orders placed or performed during the  hospital encounter of 97/35/32  Basic metabolic panel  Result Value Ref Range   Sodium 134 (L) 135 - 145 mmol/L   Potassium 3.9 3.5 - 5.1 mmol/L   Chloride 104 101 - 111 mmol/L   CO2 27 22 - 32 mmol/L   Glucose, Bld 88 65 - 99 mg/dL   BUN 9 6 - 20 mg/dL   Creatinine, Ser 0.86 0.44 - 1.00 mg/dL   Calcium 9.1 8.9 - 10.3 mg/dL   GFR calc non Af Amer >60 >60 mL/min   GFR calc Af Amer >60 >60 mL/min   Anion gap 3 (L) 5 - 15  CBC  Result Value Ref Range   WBC 5.4 3.6 - 11.0 K/uL   RBC 4.68 3.80 - 5.20 MIL/uL   Hemoglobin 13.6 12.0 - 16.0 g/dL   HCT 40.4 35.0 - 47.0 %   MCV 86.3 80.0 - 100.0 fL   MCH 29.1 26.0 - 34.0 pg   MCHC 33.7 32.0 - 36.0 g/dL   RDW 13.7 11.5 - 14.5 %   Platelets 305 150 - 440 K/uL  Troponin I  Result Value Ref Range   Troponin I <0.03 <0.03 ng/mL  Fibrin derivatives D-Dimer ([facility] only)  Result Value Ref Range   Fibrin derivatives D-dimer ([facility]) 520.45 (H) 0.00 - 499.00 ng/mL (FEU)  Troponin I  Result Value Ref Range   Troponin I <0.03 <0.03 ng/mL  Assessment & Plan:   Problem List Items Addressed This Visit      Other   Anxiety state    Discuss anxiety care and treatment will continue clonazepam 1 mg twice a day      Fibromyalgia    Discussed fibromyalgia with patient as has done previously in the emergency room patient will exercise try to get better sleep and better lifestyle habits for improving fibromyalgia.          Follow up plan: Return in about 2 months (around 10/27/2017) for Physical Exam.

## 2017-09-04 ENCOUNTER — Encounter: Payer: Self-pay | Admitting: Family Medicine

## 2017-09-04 ENCOUNTER — Ambulatory Visit (INDEPENDENT_AMBULATORY_CARE_PROVIDER_SITE_OTHER): Payer: Medicare Other | Admitting: Family Medicine

## 2017-09-04 VITALS — BP 116/72 | HR 68 | Wt 142.0 lb

## 2017-09-04 DIAGNOSIS — M797 Fibromyalgia: Secondary | ICD-10-CM

## 2017-09-04 DIAGNOSIS — R300 Dysuria: Secondary | ICD-10-CM | POA: Diagnosis not present

## 2017-09-04 NOTE — Progress Notes (Signed)
BP 116/72   Pulse 68   Wt 142 lb (64.4 kg)   SpO2 98%   BMI 26.83 kg/m    Subjective:    Patient ID: Lindsey Bautista, female    DOB: May 16, 1961, 57 y.o.   MRN: [id number]  HPI: Lindsey Bautista is a 57 y.o. female  Chief Complaint  Patient presents with  . Back Pain   Back pain flank pain area of discomfort no rash discomfort going on the last week or so no blood in stool or urine no real dysuria but some stronger odor.  No frequency may be some mild urgency. Has some mild nausea Achiness of fibromyalgia continues Relevant past medical, surgical, family and social history reviewed and updated as indicated. Interim medical history since our last visit reviewed. Allergies and medications reviewed and updated.  Review of Systems  Constitutional: Negative.   Respiratory: Negative.   Cardiovascular: Negative.     Per HPI unless specifically indicated above     Objective:    BP 116/72   Pulse 68   Wt 142 lb (64.4 kg)   SpO2 98%   BMI 26.83 kg/m   Wt Readings from Last 3 Encounters:  09/04/17 142 lb (64.4 kg)  08/29/17 144 lb (65.3 kg)  06/26/17 142 lb (64.4 kg)    Physical Exam  Constitutional: She is oriented to person, place, and time. She appears well-developed and well-nourished.  HENT:  Head: Normocephalic and atraumatic.  Eyes: Conjunctivae and EOM are normal.  Neck: Normal range of motion.  Cardiovascular: Normal rate, regular rhythm and normal heart sounds.  Pulmonary/Chest: Effort normal and breath sounds normal.  Musculoskeletal: Normal range of motion.  Neurological: She is alert and oriented to person, place, and time.  Skin: No erythema.  Psychiatric: She has a normal mood and affect. Her behavior is normal. Judgment and thought content normal.    Results for orders placed or performed during the hospital encounter of 10/62/69  Basic metabolic panel  Result Value Ref Range   Sodium 134 (L) 135 - 145 mmol/L   Potassium 3.9 3.5 - 5.1 mmol/L   Chloride 104 101 - 111 mmol/L   CO2 27 22 - 32 mmol/L   Glucose, Bld 88 65 - 99 mg/dL   BUN 9 6 - 20 mg/dL   Creatinine, Ser 0.86 0.44 - 1.00 mg/dL   Calcium 9.1 8.9 - 10.3 mg/dL   GFR calc non Af Amer >60 >60 mL/min   GFR calc Af Amer >60 >60 mL/min   Anion gap 3 (L) 5 - 15  CBC  Result Value Ref Range   WBC 5.4 3.6 - 11.0 K/uL   RBC 4.68 3.80 - 5.20 MIL/uL   Hemoglobin 13.6 12.0 - 16.0 g/dL   HCT 40.4 35.0 - 47.0 %   MCV 86.3 80.0 - 100.0 fL   MCH 29.1 26.0 - 34.0 pg   MCHC 33.7 32.0 - 36.0 g/dL   RDW 13.7 11.5 - 14.5 %   Platelets 305 150 - 440 K/uL  Troponin I  Result Value Ref Range   Troponin I <0.03 <0.03 ng/mL  Fibrin derivatives D-Dimer ([facility] only)  Result Value Ref Range   Fibrin derivatives D-dimer ([facility]) 520.45 (H) 0.00 - 499.00 ng/mL (FEU)  Troponin I  Result Value Ref Range   Troponin I <0.03 <0.03 ng/mL      Assessment & Plan:   Problem List Items Addressed This Visit      Other   Fibromyalgia  Discussed again fibromyalgia       Other Visit Diagnoses    Dysuria    -  Primary   Relevant Orders   Urinalysis, Routine w reflex microscopic    Discuss flank pain musculoskeletal pain activity nausea  Follow up plan: No Follow-up on file.

## 2017-09-04 NOTE — Assessment & Plan Note (Signed)
Discussed again fibromyalgia

## 2017-09-05 ENCOUNTER — Ambulatory Visit: Payer: Self-pay | Admitting: Family Medicine

## 2017-09-05 LAB — URINALYSIS, ROUTINE W REFLEX MICROSCOPIC
Bilirubin, UA: NEGATIVE
Glucose, UA: NEGATIVE
KETONES UA: NEGATIVE
Leukocytes, UA: NEGATIVE
Nitrite, UA: NEGATIVE
Protein, UA: NEGATIVE
RBC, UA: NEGATIVE
Specific Gravity, UA: 1.015 (ref 1.005–1.030)
UUROB: 0.2 mg/dL (ref 0.2–1.0)
pH, UA: 6 (ref 5.0–7.5)

## 2017-11-20 ENCOUNTER — Other Ambulatory Visit: Payer: Self-pay | Admitting: Family Medicine

## 2017-11-20 NOTE — Telephone Encounter (Signed)
Rx refill request: Klonopin 1 mg  LOV: 09/04/17  PCP: [facility]  Pharmacy: verified

## 2017-11-20 NOTE — Telephone Encounter (Signed)
Rx refill request: Estrace 1 mg   LOV: 09/04/17  PCP: Crissman  Pharmacy: verified

## 2017-12-24 ENCOUNTER — Other Ambulatory Visit: Payer: Self-pay | Admitting: Family Medicine

## 2017-12-24 NOTE — Telephone Encounter (Signed)
Your patient 

## 2018-02-04 ENCOUNTER — Ambulatory Visit: Payer: Self-pay

## 2018-02-04 ENCOUNTER — Ambulatory Visit: Payer: Medicare Other | Admitting: Physician Assistant

## 2018-02-04 NOTE — Telephone Encounter (Signed)
Pt made an appt with GI.

## 2018-02-04 NOTE — Telephone Encounter (Signed)
Pt. States Friday night she had a large loose BM "with a lot of bright red blood." States she got weak and sweaty and her husband had to get her in the bed. Repots she had "about 20 episodes of passing bright red blood - no more stool." Last episode was 4 p.m. Yesterday. States she had abdominal pain with this - hurt at her belly button and " all the way across." States "I feel weak." Appointment made for today. No availability with Dr. Jeananne Rama.  Reason for Disposition . MODERATE rectal bleeding (small blood clots, passing blood without stool, or toilet water turns red)  Answer Assessment - Initial Assessment Questions 1. APPEARANCE of BLOOD: "What color is it?" "Is it passed separately, on the surface of the stool, or mixed in with the stool?"      Bright red - unsure 2. AMOUNT: "How much blood was passed?"      Unsure - had some clots 3. FREQUENCY: "How many times has blood been passed with the stools?"      Since Friday - 20 x passing blood 4. ONSET: "When was the blood first seen in the stools?" (Days or weeks)      This past Friday. 5. DIARRHEA: "Is there also some diarrhea?" If so, ask: "How many diarrhea stools were passed in past 24 hours?"      No - just soft 6. CONSTIPATION: "Do you have constipation?" If so, "How bad is it?"     No 7. RECURRENT SYMPTOMS: "Have you had blood in your stools before?" If so, ask: "When was the last time?" and "What happened that time?"      Yes- 22 years ago. Unsure of diagnosis 8. BLOOD THINNERS: "Do you take any blood thinners?" (e.g., Coumadin/warfarin, Pradaxa/dabigatran, aspirin)     Was on ASA - but has stopped it. 9. OTHER SYMPTOMS: "Do you have any other symptoms?"  (e.g., abdominal pain, vomiting, dizziness, fever)     Abdominal pain and headache. 10. PREGNANCY: "Is there any chance you are pregnant?" "When was your last menstrual period?"       No  Protocols used: RECTAL BLEEDING-A-AH

## 2018-02-13 ENCOUNTER — Other Ambulatory Visit: Payer: Self-pay | Admitting: Family Medicine

## 2018-02-13 DIAGNOSIS — Z1231 Encounter for screening mammogram for malignant neoplasm of breast: Secondary | ICD-10-CM

## 2018-02-20 ENCOUNTER — Other Ambulatory Visit: Payer: Self-pay | Admitting: Family Medicine

## 2018-02-20 NOTE — Telephone Encounter (Signed)
clonazepam refill Last Refill:12/24/17 # 60 1 RF Last OV: 08/29/17 PCP: Dr Jeananne Rama Pharmacy:[facility] [address]

## 2018-03-07 ENCOUNTER — Other Ambulatory Visit: Payer: Self-pay | Admitting: Family Medicine

## 2018-03-07 ENCOUNTER — Other Ambulatory Visit: Payer: Self-pay

## 2018-03-07 DIAGNOSIS — Z1231 Encounter for screening mammogram for malignant neoplasm of breast: Secondary | ICD-10-CM

## 2018-03-10 ENCOUNTER — Emergency Department
Admission: EM | Admit: 2018-03-10 | Discharge: 2018-03-10 | Disposition: A | Discharge status: Home / Self Care | Payer: Medicare Other | Attending: Emergency Medicine | Admitting: Emergency Medicine

## 2018-03-10 ENCOUNTER — Emergency Department: Payer: Medicare Other

## 2018-03-10 DIAGNOSIS — Z8673 Personal history of transient ischemic attack (TIA), and cerebral infarction without residual deficits: Secondary | ICD-10-CM | POA: Insufficient documentation

## 2018-03-10 DIAGNOSIS — F17210 Nicotine dependence, cigarettes, uncomplicated: Secondary | ICD-10-CM | POA: Diagnosis not present

## 2018-03-10 DIAGNOSIS — J449 Chronic obstructive pulmonary disease, unspecified: Secondary | ICD-10-CM | POA: Diagnosis not present

## 2018-03-10 DIAGNOSIS — K5900 Constipation, unspecified: Secondary | ICD-10-CM | POA: Diagnosis not present

## 2018-03-10 DIAGNOSIS — R110 Nausea: Secondary | ICD-10-CM | POA: Insufficient documentation

## 2018-03-10 DIAGNOSIS — M545 Low back pain: Secondary | ICD-10-CM | POA: Insufficient documentation

## 2018-03-10 DIAGNOSIS — R109 Unspecified abdominal pain: Secondary | ICD-10-CM | POA: Diagnosis not present

## 2018-03-10 DIAGNOSIS — Z7982 Long term (current) use of aspirin: Secondary | ICD-10-CM | POA: Diagnosis not present

## 2018-03-10 DIAGNOSIS — R1032 Left lower quadrant pain: Secondary | ICD-10-CM | POA: Diagnosis not present

## 2018-03-10 DIAGNOSIS — Z79899 Other long term (current) drug therapy: Secondary | ICD-10-CM | POA: Diagnosis not present

## 2018-03-10 DIAGNOSIS — R1030 Lower abdominal pain, unspecified: Secondary | ICD-10-CM | POA: Diagnosis not present

## 2018-03-10 DIAGNOSIS — R1031 Right lower quadrant pain: Secondary | ICD-10-CM | POA: Diagnosis present

## 2018-03-10 DIAGNOSIS — R55 Syncope and collapse: Secondary | ICD-10-CM | POA: Diagnosis not present

## 2018-03-10 LAB — COMPREHENSIVE METABOLIC PANEL
ALBUMIN: 3.9 g/dL (ref 3.5–5.0)
ALT: 11 U/L (ref 0–44)
ANION GAP: 7 (ref 5–15)
AST: 20 U/L (ref 15–41)
Alkaline Phosphatase: 67 U/L (ref 38–126)
BUN: 11 mg/dL (ref 6–20)
CO2: 28 mmol/L (ref 22–32)
Calcium: 9 mg/dL (ref 8.9–10.3)
Chloride: 104 mmol/L (ref 98–111)
Creatinine, Ser: 0.8 mg/dL (ref 0.44–1.00)
GFR calc non Af Amer: >60 mL/min (ref >60)
GLUCOSE: 92 mg/dL (ref 70–99)
POTASSIUM: 4.1 mmol/L (ref 3.5–5.1)
SODIUM: 139 mmol/L (ref 135–145)
TOTAL PROTEIN: 7.3 g/dL (ref 6.5–8.1)
Total Bilirubin: 0.6 mg/dL (ref 0.3–1.2)

## 2018-03-10 LAB — URINALYSIS, COMPLETE (UACMP) WITH MICROSCOPIC
BILIRUBIN URINE: NEGATIVE
GLUCOSE, UA: NEGATIVE mg/dL
HGB URINE DIPSTICK: NEGATIVE
Ketones, ur: NEGATIVE mg/dL
LEUKOCYTES UA: NEGATIVE
NITRITE: NEGATIVE
Protein, ur: NEGATIVE mg/dL
SPECIFIC GRAVITY, URINE: 1.023 (ref 1.005–1.030)
pH: 5 (ref 5.0–8.0)

## 2018-03-10 LAB — LIPASE, BLOOD: Lipase: 66 U/L — ABNORMAL HIGH (ref 11–51)

## 2018-03-10 LAB — CBC WITH DIFFERENTIAL/PLATELET
BASOS ABS: 0 10*3/uL (ref 0–0.1)
BASOS PCT: 1 %
EOS ABS: 0.1 10*3/uL (ref 0–0.7)
Eosinophils Relative: 1 %
HEMATOCRIT: 42.2 % (ref 35.0–47.0)
Hemoglobin: 14.7 g/dL (ref 12.0–16.0)
Lymphocytes Relative: 41 %
Lymphs Abs: 2.2 10*3/uL (ref 1.0–3.6)
MCH: 30.5 pg (ref 26.0–34.0)
MCHC: 34.8 g/dL (ref 32.0–36.0)
MCV: 87.5 fL (ref 80.0–100.0)
MONO ABS: 0.4 10*3/uL (ref 0.2–0.9)
Monocytes Relative: 8 %
NEUTROS PCT: 49 %
Neutro Abs: 2.6 10*3/uL (ref 1.4–6.5)
Platelets: 272 10*3/uL (ref 150–440)
RBC: 4.82 MIL/uL (ref 3.80–5.20)
RDW: 13.5 % (ref 11.5–14.5)
WBC: 5.3 10*3/uL (ref 3.6–11.0)

## 2018-03-10 MED ORDER — MORPHINE SULFATE (PF) 4 MG/ML IV SOLN
4.0000 mg | Freq: Once | INTRAVENOUS | Status: AC
  Administered 2018-03-10: 4 mg via INTRAVENOUS
  Filled 2018-03-10: qty 1

## 2018-03-10 MED ORDER — MAGNESIUM CITRATE PO SOLN
1.0000 | Freq: Once | ORAL | Status: AC
  Administered 2018-03-10: 1

## 2018-03-10 MED ORDER — DOCUSATE SODIUM 100 MG PO CAPS: 100.0000 mg | ORAL_CAPSULE | Freq: Every day | ORAL | 0 refills | Status: DC | PRN

## 2018-03-10 MED ORDER — SODIUM CHLORIDE 0.9 % IV BOLUS
1000.0000 mL | Freq: Once | INTRAVENOUS | Status: AC
  Administered 2018-03-10: 1000 mL via INTRAVENOUS

## 2018-03-10 MED ORDER — DOCUSATE SODIUM 100 MG PO CAPS
100.0000 mg | ORAL_CAPSULE | Freq: Once | ORAL | Status: AC
  Administered 2018-03-10: 100 mg via ORAL
  Filled 2018-03-10: qty 1

## 2018-03-10 MED ORDER — FLEET ENEMA 7-19 GM/118ML RE ENEM: 1.0000 | ENEMA | Freq: Every day | RECTAL | 0 refills | Status: AC | PRN

## 2018-03-10 MED ORDER — ONDANSETRON HCL 4 MG/2ML IJ SOLN
4.0000 mg | Freq: Once | INTRAMUSCULAR | Status: AC
  Administered 2018-03-10: 4 mg via INTRAVENOUS
  Filled 2018-03-10: qty 2

## 2018-03-10 NOTE — ED Provider Notes (Signed)
-----------------------------------------   4:26 PM on 03/10/2018 -----------------------------------------  I took over care of this patient from Dr. Clearnce Hasten.  The patient had abdominal pain and was diagnosed with constipation.  The plan was to have the patient use an enema and see if she felt better after a bowel movement.  The patient took the enema but has not yet had a bowel movement.  However, she states that she would like to go home.  At this time, given the otherwise negative imaging and reassuring labs, the patient is appropriate for discharge home.  I have prescribed another enema for home as well as the Colace prescribed by Dr. Clearnce Hasten.  I gave the patient thorough return precautions, she expressed understanding.   Arta Silence, MD 03/10/18 1635

## 2018-03-10 NOTE — ED Notes (Signed)
Pt returned to room at this time

## 2018-03-10 NOTE — ED Triage Notes (Signed)
Pt presetns with LLQ abd pain the patient is very tender to touch there and is stating pain is a 10/10 when touched. Pt started Thursday evening and has progressively gotten worse over the last 2 days

## 2018-03-10 NOTE — ED Notes (Signed)
Pt tolerated soap suds enema but did not have any stool come out.

## 2018-03-10 NOTE — ED Provider Notes (Addendum)
[facility] Emergency Department Provider Note ____________________________________________   First MD Initiated Contact with Patient 03/10/18 1330     (approximate)  I have reviewed the triage vital signs and the nursing notes.   HISTORY  Chief Complaint Abdominal Pain  HPI Lindsey Bautista is a 57 y.o. female with a history of hypotension, IBS and syncope was presenting with right lower quadrant abdominal pain over the past 4 days.  Says the pain is an 11 out of 10 at this time and feels like a sharp crampy pain.  She says that she feels nauseous but has not vomited.  No diarrhea and says that she is passing gas.  Multiple laparoscopic surgeries including a partial hysterectomy with removal of her appendix.  Denies any burning with urination or urinary frequency.  Says that she is also having mild low back pain across the low back as well.  Past Medical History:  Diagnosis Date  . Atypical chest pain    a. 08/2016 Neg MV.  [name] COPD (chronic obstructive pulmonary disease) ([place])   . Depression   . Diastolic dysfunction    a. 10/2016 Echo: EF 60-65%, no rwma, Gr1 DD, mild MR, nl LA size, nl RV fxn.  [name] GERD (gastroesophageal reflux disease)   . Hypotension   . IBS (irritable bowel syndrome)   . Right arm weakness   . Situational syncope   . TIA (transient ischemic attack)     Patient Active Problem List   Diagnosis Date Noted  . Fibromyalgia 08/29/2017  . Right sided weakness 11/16/2016  . Conversion disorder with mixed symptoms 03/30/2016  . Abdominal aortic atherosclerosis ([place]) 12/29/2015  . Adrenal gland cyst ([place]) 12/29/2015  . Anxiety state   . Syncope and collapse   . COPD (chronic obstructive pulmonary disease) ([place]) 12/13/2015  . Chest pain 12/13/2015  . Gastroesophageal reflux disease without esophagitis   . PAD (peripheral artery disease) ([place]) 10/22/2015  . Hyperlipidemia 10/22/2015  . Cervical paraspinal muscle spasm 09/30/2015  .  Benzodiazepine dependence, continuous ([place]) 09/08/2015  . Acute anxiety 06/07/2015  . Depression 06/07/2015  . Situational syncope 06/07/2015  . Smoker 04/21/2015    Past Surgical History:  Procedure Laterality Date  . COLONOSCOPY WITH PROPOFOL N/A 11/22/2015   Procedure: COLONOSCOPY WITH PROPOFOL;  Surgeon: Manya Silvas, MD;  Location: [facility] ENDOSCOPY;  Service: Endoscopy;  Laterality: N/A;  . ESOPHAGOGASTRODUODENOSCOPY (EGD) WITH PROPOFOL N/A 11/22/2015   Procedure: ESOPHAGOGASTRODUODENOSCOPY (EGD) WITH PROPOFOL;  Surgeon: Manya Silvas, MD;  Location: [facility] ENDOSCOPY;  Service: Endoscopy;  Laterality: N/A;  . TONSILLECTOMY    . VAGINAL HYSTERECTOMY      Prior to Admission medications   Medication Sig Start Date End Date Taking? Authorizing Provider  aspirin 81 MG tablet Take 81 mg by mouth daily.    [provider]  atorvastatin (LIPITOR) 80 MG tablet TAKE ONE TABLET BY MOUTH ONCE DAILY 07/05/17   Minna Merritts, MD  clonazePAM (KLONOPIN) 1 MG tablet TAKE 1 TABLET BY MOUTH TWICE DAILY 02/20/18   [name], MD  estradiol (ESTRACE) 1 MG tablet TAKE 1 TABLET BY MOUTH ONCE DAILY 11/21/17   Volney American, PA-C  FLUoxetine (PROZAC) 20 MG capsule TAKE ONE CAPSULE BY MOUTH ONCE DAILY 04/09/17   [name], MD  nitroGLYCERIN (NITROSTAT) 0.4 MG SL tablet Place 1 tablet (0.4 mg total) under the tongue every 5 (five) minutes as needed for chest pain. 12/04/16   [name], MD  pantoprazole (  PROTONIX) 40 MG tablet Take 1 tablet (40 mg total) by mouth daily. 05/09/17   Volney American, PA-C    Allergies Penicillin g and Iodinated diagnostic agents  Family History  Problem Relation Age of Onset  . Heart disease Unknown   . COPD Unknown   . Stroke Unknown   . Breast cancer Paternal Aunt 29    Social History Social History   Tobacco Use  . Smoking status: Current Every Day Smoker    Packs/day: 1.00    Years: 25.00    Pack years: 25.00     Types: Cigarettes  . Smokeless tobacco: Never Used  Substance Use Topics  . Alcohol use: No    Alcohol/week: 0.0 oz  . Drug use: No    Review of Systems  Constitutional: No fever/chills Eyes: No visual changes. ENT: No sore throat. Cardiovascular: Denies chest pain. Respiratory: Denies shortness of breath. Gastrointestinal:  no vomiting.  No diarrhea.  No constipation. Genitourinary: Negative for dysuria. Musculoskeletal: Negative for back pain. Skin: Negative for rash. Neurological: Negative for headaches, focal weakness or numbness.   ____________________________________________   PHYSICAL EXAM:  VITAL SIGNS: ED Triage Vitals  Enc Vitals Group     BP 03/10/18 1330 136/62     Pulse Rate 03/10/18 1330 64     Resp --      Temp --      Temp src --      SpO2 03/10/18 1330 100 %     Weight 03/10/18 1331 136 lb (61.7 kg)     Height 03/10/18 1331 5\' 1"  (1.549 m)     Head Circumference --      Peak Flow --      Pain Score 03/10/18 1331 10     Pain Loc --      Pain Edu? --      Excl. in [place]? --     Constitutional: Alert and oriented. Well appearing and in no acute distress. Eyes: Conjunctivae are normal.  Head: Atraumatic. Nose: No congestion/rhinnorhea. Mouth/Throat: Mucous membranes are moist.  Neck: No stridor.   Cardiovascular: Normal rate, regular rhythm. Grossly normal heart sounds.  Good peripheral circulation. Respiratory: Normal respiratory effort.  No retractions. Lungs CTAB. Gastrointestinal: Soft with moderate to severe right lower quadrant abdominal tenderness to palpation.  Mild distention. No CVA tenderness. Musculoskeletal: No lower extremity tenderness nor edema.  No joint effusions. Neurologic:  Normal speech and language. No gross focal neurologic deficits are appreciated. Skin:  Skin is warm, dry and intact. No rash noted. Psychiatric: Mood and affect are normal. Speech and behavior are normal.  ____________________________________________     LABS (all labs ordered are listed, but only abnormal results are displayed)  Labs Reviewed  LIPASE, BLOOD - Abnormal; Notable for the following components:      Result Value   Lipase 66 (*)    All other components within normal limits  URINALYSIS, COMPLETE (UACMP) WITH MICROSCOPIC - Abnormal; Notable for the following components:   Color, Urine AMBER (*)    APPearance HAZY (*)    Bacteria, UA MANY (*)    All other components within normal limits  CBC WITH DIFFERENTIAL/PLATELET  COMPREHENSIVE METABOLIC PANEL   ____________________________________________  EKG  ED ECG REPORT I, Doran Stabler, the attending physician, personally viewed and interpreted this ECG.   Date: 03/10/2018  EKG Time: 1329  Rate: 64  Rhythm: normal sinus rhythm  Axis: Normal  Intervals:none  ST&T Change: No ST segment elevation or depression.  No abnormal T wave inversion.  ____________________________________________  RADIOLOGY  Finding on CT of the abdomen and pelvis.  Moderate amount of stool in the colon. ____________________________________________   PROCEDURES  Procedure(s) performed:   Procedures  Critical Care performed:   ____________________________________________   INITIAL IMPRESSION / ASSESSMENT AND PLAN / ED COURSE  Pertinent labs & imaging results that were available during my care of the patient were reviewed by me and considered in my medical decision making (see chart for details).  Differential diagnosis includes, but is not limited to, ovarian cyst, ovarian torsion, acute appendicitis, diverticulitis, urinary tract infection/pyelonephritis, endometriosis, bowel obstruction, colitis, renal colic, gastroenteritis, hernia, fibroids, endometriosis, pregnancy related pain including ectopic pregnancy, etc. As part of my medical decision making, I reviewed the following data within the electronic MEDICAL RECORD NUMBER Notes from prior ED  visits  ----------------------------------------- 3:19 PM on 03/10/2018 -----------------------------------------  Patient without acute finding on the CAT scan.  Lipase of 66 but otherwise reassuring lab work.  No tenderness to the upper abdomen.  Does not clinically correlate.  Patient now saying that she has had difficulty with constipation in the past and occasionally will have very hard stools and was in quite a deal of pain several weeks ago related to constipation.  Digital rectal exam with a small amount of grossly brown stool on the glove afterwards.  No fecal impaction palpated.  She will receive an enema.  She will require reassessment.  Signed out to Dr. Cherylann Banas. __________________________________________   FINAL CLINICAL IMPRESSION(S) / ED DIAGNOSES  Lower abdominal pain.  Constipation.    NEW MEDICATIONS STARTED DURING THIS VISIT:  New Prescriptions   No medications on file     Note:  This document was prepared using Dragon voice recognition software and may include unintentional dictation errors.     Orbie Pyo, MD 03/10/18 [place], Randall An, MD 03/18/18 [phone]

## 2018-03-11 ENCOUNTER — Emergency Department
Admission: EM | Admit: 2018-03-11 | Discharge: 2018-03-11 | Disposition: A | Discharge status: Home / Self Care | Payer: Medicare Other | Attending: Emergency Medicine | Admitting: Emergency Medicine

## 2018-03-11 ENCOUNTER — Encounter: Payer: Self-pay | Admitting: *Deleted

## 2018-03-11 ENCOUNTER — Other Ambulatory Visit: Payer: Self-pay

## 2018-03-11 DIAGNOSIS — Z8673 Personal history of transient ischemic attack (TIA), and cerebral infarction without residual deficits: Secondary | ICD-10-CM | POA: Diagnosis not present

## 2018-03-11 DIAGNOSIS — F17210 Nicotine dependence, cigarettes, uncomplicated: Secondary | ICD-10-CM | POA: Insufficient documentation

## 2018-03-11 DIAGNOSIS — Z8719 Personal history of other diseases of the digestive system: Secondary | ICD-10-CM | POA: Diagnosis not present

## 2018-03-11 DIAGNOSIS — J449 Chronic obstructive pulmonary disease, unspecified: Secondary | ICD-10-CM | POA: Insufficient documentation

## 2018-03-11 DIAGNOSIS — R1084 Generalized abdominal pain: Secondary | ICD-10-CM | POA: Diagnosis not present

## 2018-03-11 LAB — LACTIC ACID, PLASMA: LACTIC ACID, VENOUS: 0.6 mmol/L (ref 0.5–1.9)

## 2018-03-11 LAB — COMPREHENSIVE METABOLIC PANEL
ALBUMIN: 4 g/dL (ref 3.5–5.0)
ALK PHOS: 74 U/L (ref 38–126)
ALT: 12 U/L (ref 0–44)
AST: 21 U/L (ref 15–41)
Anion gap: 7 (ref 5–15)
BUN: 9 mg/dL (ref 6–20)
CALCIUM: 9 mg/dL (ref 8.9–10.3)
CO2: 26 mmol/L (ref 22–32)
Chloride: 106 mmol/L (ref 98–111)
Creatinine, Ser: 0.86 mg/dL (ref 0.44–1.00)
GFR calc Af Amer: >60 mL/min (ref >60)
GFR calc non Af Amer: >60 mL/min (ref >60)
GLUCOSE: 91 mg/dL (ref 70–99)
POTASSIUM: 3.8 mmol/L (ref 3.5–5.1)
SODIUM: 139 mmol/L (ref 135–145)
Total Bilirubin: 0.9 mg/dL (ref 0.3–1.2)
Total Protein: 7.2 g/dL (ref 6.5–8.1)

## 2018-03-11 LAB — CBC
HCT: 40.1 % (ref 35.0–47.0)
Hemoglobin: 14 g/dL (ref 12.0–16.0)
MCH: 30.6 pg (ref 26.0–34.0)
MCHC: 34.8 g/dL (ref 32.0–36.0)
MCV: 87.8 fL (ref 80.0–100.0)
PLATELETS: 253 10*3/uL (ref 150–440)
RBC: 4.57 MIL/uL (ref 3.80–5.20)
RDW: 13.8 % (ref 11.5–14.5)
WBC: 6.1 10*3/uL (ref 3.6–11.0)

## 2018-03-11 LAB — LIPASE, BLOOD: Lipase: 37 U/L (ref 11–51)

## 2018-03-11 MED ORDER — DICYCLOMINE HCL 20 MG PO TABS: 20.0000 mg | ORAL_TABLET | Freq: Three times a day (TID) | ORAL | 0 refills | Status: DC | PRN

## 2018-03-11 MED ORDER — SUCRALFATE 1 G PO TABS: 1.0000 g | ORAL_TABLET | Freq: Four times a day (QID) | ORAL | 0 refills | Status: DC

## 2018-03-11 MED ORDER — HALOPERIDOL LACTATE 5 MG/ML IJ SOLN
5.0000 mg | Freq: Once | INTRAMUSCULAR | Status: AC
  Administered 2018-03-11: 5 mg via INTRAVENOUS
  Filled 2018-03-11: qty 1

## 2018-03-11 NOTE — ED Triage Notes (Signed)
Pt to ED reporting pain in her upper abd and lower back that has worsened since yesterday. Pt was given an enema and colace in ED and verbalized she was able to pass some water like stool but has not been able to pass a large amount of stool. Pt was discharged yesterday and told to come back if it worsened. Pt is tearful in triage and holding her abd. Pt appears to be very uncomfortable. Nausea reported but no vomiting. Pt reports feeling as though "my stomach is just expanding"

## 2018-03-11 NOTE — ED Provider Notes (Signed)
[facility] Emergency Department Provider Note   ____________________________________________   I have reviewed the triage vital signs and the nursing notes.   HISTORY  Chief Complaint Abdominal Pain   History limited by: Not Limited   HPI Lindsey Bautista is a 57 y.o. female who presents to the emergency department today because of concerns for continued abdominal pain.  The patient was seen in the emergency department yesterday for the abdominal pain.  Had negative work-up including CT scan at that time.  It was attributed to possible constipation patient was given enemas.  The patient states the pain is continued.  She describes as being located in the epigastric and right side.  She has had some nausea but no vomiting.  The patient has not had any fevers.   Per medical record review patient has a history of ER visit yesterday.   Past Medical History:  Diagnosis Date  . Atypical chest pain    a. 08/2016 Neg MV.  [name] COPD (chronic obstructive pulmonary disease) ([place])   . Depression   . Diastolic dysfunction    a. 10/2016 Echo: EF 60-65%, no rwma, Gr1 DD, mild MR, nl LA size, nl RV fxn.  [name] GERD (gastroesophageal reflux disease)   . Hypotension   . IBS (irritable bowel syndrome)   . Right arm weakness   . Situational syncope   . TIA (transient ischemic attack)     Patient Active Problem List   Diagnosis Date Noted  . Fibromyalgia 08/29/2017  . Right sided weakness 11/16/2016  . Conversion disorder with mixed symptoms 03/30/2016  . Abdominal aortic atherosclerosis ([place]) 12/29/2015  . Adrenal gland cyst ([place]) 12/29/2015  . Anxiety state   . Syncope and collapse   . COPD (chronic obstructive pulmonary disease) ([place]) 12/13/2015  . Chest pain 12/13/2015  . Gastroesophageal reflux disease without esophagitis   . PAD (peripheral artery disease) ([place]) 10/22/2015  . Hyperlipidemia 10/22/2015  . Cervical paraspinal muscle spasm 09/30/2015  . Benzodiazepine  dependence, continuous ([place]) 09/08/2015  . Acute anxiety 06/07/2015  . Depression 06/07/2015  . Situational syncope 06/07/2015  . Smoker 04/21/2015    Past Surgical History:  Procedure Laterality Date  . COLONOSCOPY WITH PROPOFOL N/A 11/22/2015   Procedure: COLONOSCOPY WITH PROPOFOL;  Surgeon: Manya Silvas, MD;  Location: [facility] ENDOSCOPY;  Service: Endoscopy;  Laterality: N/A;  . ESOPHAGOGASTRODUODENOSCOPY (EGD) WITH PROPOFOL N/A 11/22/2015   Procedure: ESOPHAGOGASTRODUODENOSCOPY (EGD) WITH PROPOFOL;  Surgeon: Manya Silvas, MD;  Location: [facility] ENDOSCOPY;  Service: Endoscopy;  Laterality: N/A;  . TONSILLECTOMY    . VAGINAL HYSTERECTOMY      Prior to Admission medications   Medication Sig Start Date End Date Taking? Authorizing Provider  aspirin 81 MG tablet Take 81 mg by mouth daily.    [provider]  atorvastatin (LIPITOR) 80 MG tablet TAKE ONE TABLET BY MOUTH ONCE DAILY 07/05/17   Minna Merritts, MD  clonazePAM (KLONOPIN) 1 MG tablet TAKE 1 TABLET BY MOUTH TWICE DAILY 02/20/18   [name], MD  docusate sodium (COLACE) 100 MG capsule Take 1 capsule (100 mg total) by mouth daily as needed. 03/10/18 03/10/19  Orbie Pyo, MD  estradiol (ESTRACE) 1 MG tablet TAKE 1 TABLET BY MOUTH ONCE DAILY 11/21/17   Volney American, PA-C  FLUoxetine (PROZAC) 20 MG capsule TAKE ONE CAPSULE BY MOUTH ONCE DAILY 04/09/17   [name], MD  nitroGLYCERIN (NITROSTAT) 0.4 MG SL tablet Place 1 tablet (0.4 mg total) under  the tongue every 5 (five) minutes as needed for chest pain. 12/04/16   [name], MD  pantoprazole (PROTONIX) 40 MG tablet Take 1 tablet (40 mg total) by mouth daily. 05/09/17   Volney American, PA-C  sodium phosphate (FLEET) 7-19 GM/118ML ENEM Place 133 mLs (1 enema total) rectally daily as needed for up to 2 days for severe constipation. 03/10/18 03/12/18  Arta Silence, MD    Allergies Penicillin g and Iodinated diagnostic  agents  Family History  Problem Relation Age of Onset  . Heart disease Unknown   . COPD Unknown   . Stroke Unknown   . Breast cancer Paternal Aunt 72    Social History Social History   Tobacco Use  . Smoking status: Current Every Day Smoker    Packs/day: 1.00    Years: 25.00    Pack years: 25.00    Types: Cigarettes  . Smokeless tobacco: Never Used  Substance Use Topics  . Alcohol use: No    Alcohol/week: 0.0 oz  . Drug use: No    Review of Systems Constitutional: No fever/chills Eyes: No visual changes. ENT: No sore throat. Cardiovascular: Denies chest pain. Respiratory: Denies shortness of breath. Gastrointestinal: Positive for abdominal pain. Genitourinary: Negative for dysuria. Musculoskeletal: Negative for back pain. Skin: Negative for rash. Neurological: Negative for headaches, focal weakness or numbness.  ____________________________________________   PHYSICAL EXAM:  VITAL SIGNS: ED Triage Vitals [03/11/18 1344]  Enc Vitals Group     BP (!) 151/86     Pulse Rate 79     Resp 16     Temp 98.4 F (36.9 C)     Temp Source Oral     SpO2 98 %     Weight 136 lb (61.7 kg)     Height 5\' 1"  (1.549 m)     Head Circumference      Peak Flow      Pain Score 10   Constitutional: Alert and oriented.  Eyes: Conjunctivae are normal.  ENT      Head: Normocephalic and atraumatic.      Nose: No congestion/rhinnorhea.      Mouth/Throat: Mucous membranes are moist.      Neck: No stridor. Hematological/Lymphatic/Immunilogical: No cervical lymphadenopathy. Cardiovascular: Normal rate, regular rhythm.  No murmurs, rubs, or gallops. Respiratory: Normal respiratory effort without tachypnea nor retractions. Breath sounds are clear and equal bilaterally. No wheezes/rales/rhonchi. Gastrointestinal: Soft and somewhat diffusely tender to palpation. No rebound. No guarding.  Genitourinary: Deferred Musculoskeletal: Normal range of motion in all extremities. No lower  extremity edema. Neurologic:  Normal speech and language. No gross focal neurologic deficits are appreciated.  Skin:  Skin is warm, dry and intact. No rash noted. Psychiatric: Mood and affect are normal. Speech and behavior are normal. Patient exhibits appropriate insight and judgment.  ____________________________________________    LABS (pertinent positives/negatives)  Lipase 37 CBC wnl CMP wnl  ____________________________________________   EKG  None  ____________________________________________    RADIOLOGY  None  ____________________________________________   PROCEDURES  Procedures  ____________________________________________   INITIAL IMPRESSION / ASSESSMENT AND PLAN / ED COURSE  Pertinent labs & imaging results that were available during my care of the patient were reviewed by me and considered in my medical decision making (see chart for details).   Patient presented to the emergency department today because of continued abdominal pain. Was seen yesterday with negative work up and ct scan. Blood work checked today without concerning findings. Do not feel emergent reimaging is necessary.  Patient did feel significant improvement after haldol injection. Patient states she has a GI doctor. Discussed importance of follow up.  ____________________________________________   FINAL CLINICAL IMPRESSION(S) / ED DIAGNOSES  Final diagnoses:  Generalized abdominal pain     Note: This dictation was prepared with Dragon dictation. Any transcriptional errors that result from this process are unintentional     Nance Pear, MD 03/12/18 [phone]

## 2018-03-11 NOTE — Discharge Instructions (Signed)
Please seek medical attention for any high fevers, chest pain, shortness of breath, change in behavior, persistent vomiting, bloody stool or any other new or concerning symptoms.  

## 2018-03-27 ENCOUNTER — Ambulatory Visit
Admission: RE | Admit: 2018-03-27 | Discharge: 2018-03-27 | Disposition: A | Discharge status: Home / Self Care | Payer: Medicare Other | Source: Ambulatory Visit | Attending: Family Medicine | Admitting: Family Medicine

## 2018-03-27 DIAGNOSIS — Z1231 Encounter for screening mammogram for malignant neoplasm of breast: Secondary | ICD-10-CM | POA: Insufficient documentation

## 2018-04-30 ENCOUNTER — Telehealth: Payer: Self-pay

## 2018-04-30 NOTE — Telephone Encounter (Signed)
Copied from [place] [id number]. Topic: Medicare AWV >> Apr 30, 2018  2:12 PM [place], [place] A, LPN wrote: Reason for CRM: called to reschedule medicare wellness visit as nurse health advisor will be out of office on 05/13/2018. Patient eligible anytime after 05/10/2018.   Any questions please contact kathryn brown via skype or phone- [phone]

## 2018-05-13 ENCOUNTER — Ambulatory Visit: Payer: Medicare Other

## 2018-05-15 ENCOUNTER — Encounter: Payer: Self-pay | Admitting: Emergency Medicine

## 2018-05-15 ENCOUNTER — Emergency Department
Admission: EM | Admit: 2018-05-15 | Discharge: 2018-05-15 | Disposition: A | Discharge status: Home / Self Care | Payer: Medicare Other | Attending: Emergency Medicine | Admitting: Emergency Medicine

## 2018-05-15 ENCOUNTER — Emergency Department: Payer: Medicare Other

## 2018-05-15 ENCOUNTER — Other Ambulatory Visit: Payer: Self-pay

## 2018-05-15 DIAGNOSIS — Z8673 Personal history of transient ischemic attack (TIA), and cerebral infarction without residual deficits: Secondary | ICD-10-CM | POA: Insufficient documentation

## 2018-05-15 DIAGNOSIS — Z5321 Procedure and treatment not carried out due to patient leaving prior to being seen by health care provider: Secondary | ICD-10-CM | POA: Diagnosis not present

## 2018-05-15 DIAGNOSIS — H538 Other visual disturbances: Secondary | ICD-10-CM | POA: Diagnosis not present

## 2018-05-15 DIAGNOSIS — R0602 Shortness of breath: Secondary | ICD-10-CM | POA: Insufficient documentation

## 2018-05-15 DIAGNOSIS — R42 Dizziness and giddiness: Secondary | ICD-10-CM | POA: Insufficient documentation

## 2018-05-15 LAB — CBC
HCT: 41.2 % (ref 35.0–47.0)
Hemoglobin: 14.5 g/dL (ref 12.0–16.0)
MCH: 30.9 pg (ref 26.0–34.0)
MCHC: 35.1 g/dL (ref 32.0–36.0)
MCV: 88.2 fL (ref 80.0–100.0)
Platelets: 274 10*3/uL (ref 150–440)
RBC: 4.67 MIL/uL (ref 3.80–5.20)
RDW: 13.5 % (ref 11.5–14.5)
WBC: 4.6 10*3/uL (ref 3.6–11.0)

## 2018-05-15 LAB — BASIC METABOLIC PANEL
Anion gap: 6 (ref 5–15)
BUN: 8 mg/dL (ref 6–20)
CALCIUM: 9.3 mg/dL (ref 8.9–10.3)
CO2: 31 mmol/L (ref 22–32)
Chloride: 105 mmol/L (ref 98–111)
Creatinine, Ser: 0.85 mg/dL (ref 0.44–1.00)
GFR calc Af Amer: >60 mL/min (ref >60)
GFR calc non Af Amer: >60 mL/min (ref >60)
GLUCOSE: 94 mg/dL (ref 70–99)
Potassium: 5 mmol/L (ref 3.5–5.1)
Sodium: 142 mmol/L (ref 135–145)

## 2018-05-15 NOTE — ED Triage Notes (Signed)
Pt in via EMS from home with c/o difficulty breathing. EMS reports O2 sats 97-98% on RA. Lung sounds clear. EMS reports hx of anxiety with new life stressors. EMS reports pt also with episodes of seeing spots and dizziness for the last couple of days. Last episode being Sunday

## 2018-05-15 NOTE — ED Notes (Signed)
Pt called from [facility] to treatment room, no response 

## 2018-05-15 NOTE — ED Triage Notes (Signed)
Pt comes into the ED via [facility] from home c/o shortness of breath and dizziness.  Patient states she hasn't felt well for a couple of days, but then she woke up this morning around 10:00 and felts very dizzy and increased shortness of breath.  Patient explains that the dizziness is "lightheadedness and not spinning".  Patient last known well was yesterday.  Patient states she got scared because she has a h/o TIA's in the past.  Patient neurologically intact at this time with equal grips, alert and oriented.  Patient in NAD with even and unlabored respirations.  H/o COPD.

## 2018-05-16 ENCOUNTER — Telehealth: Payer: Self-pay | Admitting: Emergency Medicine

## 2018-05-16 NOTE — Telephone Encounter (Signed)
Called patient due to lwot to inquire about condition and follow up plans.  No answer and no voicemail  

## 2018-05-20 ENCOUNTER — Other Ambulatory Visit: Payer: Self-pay | Admitting: Family Medicine

## 2018-05-20 NOTE — Telephone Encounter (Signed)
Fluoxetine refill Last refill:04/09/17 #30/12 refill Last OV:08/29/17 HYI:[id number] Pharmacy: [place] ([place] [place] [phone] (Phone) [phone] (Fax)

## 2018-05-27 ENCOUNTER — Ambulatory Visit (INDEPENDENT_AMBULATORY_CARE_PROVIDER_SITE_OTHER): Payer: Medicare Other

## 2018-05-27 VITALS — BP 112/60 | HR 69 | Temp 97.6°F | Resp 16 | Ht 62.0 in | Wt 143.4 lb

## 2018-05-27 DIAGNOSIS — Z0000 Encounter for general adult medical examination without abnormal findings: Secondary | ICD-10-CM

## 2018-05-27 NOTE — Progress Notes (Signed)
Subjective:   Lindsey Bautista is a 57 y.o. female who presents for Medicare Annual (Subsequent) preventive examination.  Review of Systems:  Cardiac Risk Factors include: dyslipidemia;hypertension     Objective:     Vitals: BP 112/60 (BP Location: Left Arm, Patient Position: Sitting, Cuff Size: Normal)   Pulse 69   Temp 97.6 F (36.4 C) (Temporal)   Resp 16   Ht 5\' 2"  (1.575 m)   Wt 143 lb 6.4 oz (65 kg)   BMI 26.23 kg/m   Body mass index is 26.23 kg/m.  Advanced Directives 05/27/2018 05/15/2018 03/11/2018 03/10/2018 05/09/2017 03/12/2017 11/17/2016  Does Patient Have a Medical Advance Directive? No No No No No No No  Does patient want to make changes to medical advance directive? - - - - Yes (MAU/Ambulatory/Procedural Areas - Information given) - -  Would patient like information on creating a medical advance directive? Yes (MAU/Ambulatory/Procedural Areas - Information given) No - Patient declined No - Patient declined No - Patient declined - - No - Patient declined    Tobacco Social History   Tobacco Use  Smoking Status Current Every Day Smoker  . Packs/day: 1.00  . Years: 25.00  . Pack years: 25.00  . Types: Cigarettes  Smokeless Tobacco Never Used     Ready to quit: Yes Counseling given: Yes   Clinical Intake:  Pre-visit preparation completed: Yes  Pain : 0-10 Pain Score: 8  Pain Type: Chronic pain Pain Location: Leg Pain Orientation: Right Pain Descriptors / Indicators: Aching Pain Onset: More than a month ago Pain Frequency: Constant     Nutritional Status: BMI 25 -29 Overweight Nutritional Risks: None Diabetes: No  How often do you need to have someone help you when you read instructions, pamphlets, or other written materials from your doctor or pharmacy?: 1 - Never What is the last grade level you completed in school?: 12th grade  Interpreter Needed?: No  Information entered by :: Pamalee Marcoe,LPN   Past Medical History:  Diagnosis Date  .  Atypical chest pain    a. 08/2016 Neg MV.  [name] COPD (chronic obstructive pulmonary disease) ([place])   . Depression   . Diastolic dysfunction    a. 10/2016 Echo: EF 60-65%, no rwma, Gr1 DD, mild MR, nl LA size, nl RV fxn.  [name] GERD (gastroesophageal reflux disease)   . Hypotension   . IBS (irritable bowel syndrome)   . Right arm weakness   . Situational syncope   . TIA (transient ischemic attack)    Past Surgical History:  Procedure Laterality Date  . COLONOSCOPY WITH PROPOFOL N/A 11/22/2015   Procedure: COLONOSCOPY WITH PROPOFOL;  Surgeon: Manya Silvas, MD;  Location: [facility] ENDOSCOPY;  Service: Endoscopy;  Laterality: N/A;  . ESOPHAGOGASTRODUODENOSCOPY (EGD) WITH PROPOFOL N/A 11/22/2015   Procedure: ESOPHAGOGASTRODUODENOSCOPY (EGD) WITH PROPOFOL;  Surgeon: Manya Silvas, MD;  Location: [facility] ENDOSCOPY;  Service: Endoscopy;  Laterality: N/A;  . OOPHORECTOMY    . TONSILLECTOMY    . VAGINAL HYSTERECTOMY     Family History  Problem Relation Age of Onset  . Bladder Cancer Mother   . Heart disease Unknown   . COPD Unknown   . Stroke Unknown   . Breast cancer Paternal Aunt 103  . Colon cancer Brother    Social History   Socioeconomic History  . Marital status: Married    Spouse name: Chyrl Elwell  . Number of children: 2  . Years of education: 44  . Highest education level:  High school graduate  Occupational History  . Occupation: Unemployed  Social Needs  . Financial resource strain: Not on file  . Food insecurity:    Worry: Not on file    Inability: Not on file  . Transportation needs:    Medical: Not on file    Non-medical: Not on file  Tobacco Use  . Smoking status: Current Every Day Smoker    Packs/day: 1.00    Years: 25.00    Pack years: 25.00    Types: Cigarettes  . Smokeless tobacco: Never Used  Substance and Sexual Activity  . Alcohol use: No    Alcohol/week: 0.0 standard drinks  . Drug use: No  . Sexual activity: Not on file  Lifestyle  . Physical  activity:    Days per week: Not on file    Minutes per session: Not on file  . Stress: Not at all  Relationships  . Social connections:    Talks on phone: Not on file    Gets together: Not on file    Attends religious service: Not on file    Active member of club or organization: Not on file    Attends meetings of clubs or organizations: Not on file    Relationship status: Married  Other Topics Concern  . Not on file  Social History Narrative   Lives at home with husband.    Caffeine use: 1 cup coffee per day    Outpatient Encounter Medications as of 05/27/2018  Medication Sig  . aspirin 81 MG tablet Take 81 mg by mouth daily.  . clonazePAM (KLONOPIN) 1 MG tablet TAKE 1 TABLET BY MOUTH TWICE DAILY  . estradiol (ESTRACE) 1 MG tablet TAKE 1 TABLET BY MOUTH ONCE DAILY  . FLUoxetine (PROZAC) 20 MG capsule TAKE 1 CAPSULE BY MOUTH ONCE DAILY  . sucralfate (CARAFATE) 1 g tablet Take 1 tablet (1 g total) by mouth 4 (four) times daily.  [name] atorvastatin (LIPITOR) 80 MG tablet TAKE ONE TABLET BY MOUTH ONCE DAILY (Patient not taking: Reported on 05/27/2018)  . dicyclomine (BENTYL) 20 MG tablet Take 1 tablet (20 mg total) by mouth 3 (three) times daily as needed (abdominal pain). (Patient not taking: Reported on 05/27/2018)  . docusate sodium (COLACE) 100 MG capsule Take 1 capsule (100 mg total) by mouth daily as needed. (Patient not taking: Reported on 05/27/2018)  . nitroGLYCERIN (NITROSTAT) 0.4 MG SL tablet Place 1 tablet (0.4 mg total) under the tongue every 5 (five) minutes as needed for chest pain. (Patient not taking: Reported on 05/27/2018)  . pantoprazole (PROTONIX) 40 MG tablet Take 1 tablet (40 mg total) by mouth daily. (Patient not taking: Reported on 05/27/2018)   No facility-administered encounter medications on file as of 05/27/2018.     Activities of Daily Living In your present state of health, do you have any difficulty performing the following activities: 05/27/2018  Hearing? N    Vision? Y  Comment wears glasses  Difficulty concentrating or making decisions? Y  Walking or climbing stairs? Y  Comment leg pain   Dressing or bathing? N  Doing errands, shopping? Y  Comment someone drives her   Preparing Food and eating ? N  Using the Toilet? N  In the past six months, have you accidently leaked urine? N  Do you have problems with loss of bowel control? N  Managing your Medications? N  Managing your Finances? N  Housekeeping or managing your Housekeeping? N  Some recent data might be hidden  Patient Care Team: [name], MD as PCP - General (Family Medicine) Rockey Situ Kathlene November, MD as PCP - Cardiology (Cardiology) Abisogun, Domenica Reamer, MD as Consulting Physician (Endocrinology) Minna Merritts, MD as Consulting Physician (Cardiology)    Assessment:   This is a routine wellness examination for Freddie.  Exercise Activities and Dietary recommendations Current Exercise Habits: The patient does not participate in regular exercise at present, Exercise limited by: None identified  Goals    . Quit smoking / using tobacco     Smoking cessation discussed        Fall Risk Fall Risk  05/27/2018 09/04/2017 08/29/2017 05/09/2017 12/04/2016  Falls in the past year? Yes No No No Yes  Number falls in past yr: 2 or more - - - 1  Injury with Fall? No - - - Yes  Follow up Falls evaluation completed;Falls prevention discussed - - - Falls evaluation completed   FALL RISK PREVENTION PERTAINING TO THE HOME:  Any stairs in or around the home WITH handrails? No  Home free of loose throw rugs in walkways, pet beds, electrical cords, etc? Yes  Adequate lighting in your home to reduce risk of falls? Yes   ASSISTIVE DEVICES UTILIZED TO PREVENT FALLS:  Life alert? No  Use of a cane, walker or w/c? No  Grab bars in the bathroom? Yes  Shower chair or bench in shower? No  Elevated toilet seat or a handicapped toilet? No   DME ORDERS:  DME order needed?  No   TIMED  UP AND GO:  Was the test performed? Yes .  Length of time to ambulate 10 feet: 8 sec.   GAIT:  Appearance of gait: Gait stead-fast and steady without the use of an assistive device. Intervention(s) required? Yes    Depression Screen PHQ 2/9 Scores 05/27/2018 08/29/2017 05/09/2017 12/04/2016  PHQ - 2 Score 2 5 6  0  PHQ- 9 Score 16 16 21  -     Cognitive Function     6CIT Screen 05/27/2018 05/09/2017  What Year? 0 points 0 points  What month? 0 points 0 points  What time? 0 points 0 points  Count back from 20 0 points 0 points  Months in reverse 0 points 0 points  Repeat phrase 2 points 6 points  Total Score 2 6    Immunization History  Administered Date(s) Administered  . Influenza,inj,Quad PF,6+ Mos 06/21/2015, 05/09/2017  . Td 08/21/2001    Qualifies for Shingles Vaccine? Yes . Due for Shingrix. Education has been provided regarding the importance of this vaccine. Pt has been advised to call insurance company to determine out of pocket expense. Advised may also receive vaccine at local pharmacy or Health Dept. Verbalized acceptance and understanding.  Tetanus: Although this vaccine is not a covered service during a Wellness Exam, does the patient still wish to receive this vaccine today?  No .  Education has been provided regarding the importance of this vaccine. Advised may receive this vaccine at local pharmacy or Health Dept. Aware to provide a copy of the vaccination record if obtained from local pharmacy or Health Dept. Verbalized acceptance and understanding.  Flu Vaccine: Due for Flu vaccine. Does the patient want to receive this vaccine today?  No . Education has been provided regarding the importance of this vaccine but still declined. Advised may receive this vaccine at local pharmacy or Health Dept. Aware to provide a copy of the vaccination record if obtained from local pharmacy or Health Dept. Verbalized  acceptance and understanding.  Pneumococcal Vaccine: not  indicated   Screening Tests Health Maintenance  Topic Date Due  . INFLUENZA VACCINE  04/22/2019 (Originally 03/21/2018)  . TETANUS/TDAP  05/28/2019 (Originally 08/22/2011)  . MAMMOGRAM  03/27/2020  . COLONOSCOPY  11/21/2020  . Hepatitis C Screening  Completed  . HIV Screening  Completed   Cancer Screenings:  Colorectal Screening: Completed 11/22/2015. Repeat every 10 years  Mammogram: Completed 03/27/2018. Repeat every year  Bone Density:not indicated  Lung Cancer Screening: (Low Dose CT Chest recommended if Age 65-80 years, 30 pack-year currently smoking OR have quit w/in 15years.) does qualify.   Lung Cancer Screening Referral: An Epic message has been sent to [name], RN (Oncology Nurse Navigator) regarding the possible need for this exam. Raquel Sarna will review the patient's chart to determine if the patient truly qualifies for the exam. If the patient qualifies, Raquel Sarna will order the Low Dose CT of the chest to facilitate the scheduling of this exam.   Additional Screening:  Hepatitis C Screening: does qualify; Completed 03/30/2016  Vision Screening: Recommended annual ophthalmology exams for early detection of glaucoma and other disorders of the eye. Is the patient up to date with their annual eye exam?  No   If pt is not established with a provider, would they like to be referred to a provider to establish care? Yes . informed of local eye doctors Dental Screening: Recommended annual dental exams for proper oral hygiene  Community Resource Referral:  CRR required this visit?  No     Plan:  I have personally reviewed and addressed the Medicare Annual Wellness questionnaire and have noted the following in the patient's chart:  A. Medical and social history B. Use of alcohol, tobacco or illicit drugs  C. Current medications and supplements D. Functional ability and status E.  Nutritional status F.  Physical activity G. Advance directives H. List of other physicians I.    Hospitalizations, surgeries, and ER visits in previous 12 months J.  [place] such as hearing and vision if needed, cognitive and depression L. Referrals and appointments   In addition, I have reviewed and discussed with patient certain preventive protocols, quality metrics, and best practice recommendations. A written personalized care plan for preventive services as well as general preventive health recommendations were provided to patient.   Signed,  Tyler Aas, LPN Nurse Health Advisor   Nurse Notes: patient request CT scan results from ED visit 05/15/2018, states she is still having some dizziness. Advised patient to make follow up appt with Dr.Crissman to discuss her concerns and questions.

## 2018-05-27 NOTE — Patient Instructions (Addendum)
Lindsey Bautista , Thank you for taking time to come for your Medicare Wellness Visit. I appreciate your ongoing commitment to your health goals. Please review the following plan we discussed and let me know if I can assist you in the future.   Screening recommendations/referrals: Colonoscopy: completed 11/22/2015 Mammogram: completed 03/27/2018 Bone Density: due at age 57  Recommended yearly ophthalmology/optometry visit for glaucoma screening and checkup Recommended yearly dental visit for hygiene and checkup  Vaccinations: Influenza vaccine: due, declined  Pneumococcal vaccine: completed series  Tdap vaccine: due, check with your insurance company for coverage  Shingles vaccine: shingrix eligible, check with your insurance company for coverage  Advanced directives: Advance directive discussed with you today. I have provided a copy for you to complete at home and have notarized. Once this is complete please bring a copy in to our office so we can scan it into your chart.  Conditions/risks identified: smoking cessation discussed, Lindsey Estelle, RN- Oncology Navigator will reach out to you to schedule your lung cancer screening.   Next appointment: Follow up in one year for your annual wellness exam.   Preventive Care 40-64 Years, Female Preventive care refers to lifestyle choices and visits with your health care provider that can promote health and wellness. What does preventive care include?  A yearly physical exam. This is also called an annual well check.  Dental exams once or twice a year.  Routine eye exams. Ask your health care provider how often you should have your eyes checked.  Personal lifestyle choices, including:  Daily care of your teeth and gums.  Regular physical activity.  Eating a healthy diet.  Avoiding tobacco and drug use.  Limiting alcohol use.  Practicing safe sex.  Taking low-dose aspirin daily starting at age 57.  Taking vitamin and mineral supplements  as recommended by your health care provider. What happens during an annual well check? The services and screenings done by your health care provider during your annual well check will depend on your age, overall health, lifestyle risk factors, and family history of disease. Counseling  Your health care provider may ask you questions about your:  Alcohol use.  Tobacco use.  Drug use.  Emotional well-being.  Home and relationship well-being.  Sexual activity.  Eating habits.  Work and work Statistician.  Method of birth control.  Menstrual cycle.  Pregnancy history. Screening  You may have the following tests or measurements:  Height, weight, and BMI.  Blood pressure.  Lipid and cholesterol levels. These may be checked every 5 years, or more frequently if you are over 57 years old.  Skin check.  Lung cancer screening. You may have this screening every year starting at age 57 if you have a 30-pack-year history of smoking and currently smoke or have quit within the past 15 years.  Fecal occult blood test (FOBT) of the stool. You may have this test every year starting at age 57.  Flexible sigmoidoscopy or colonoscopy. You may have a sigmoidoscopy every 5 years or a colonoscopy every 10 years starting at age 57.  Hepatitis C blood test.  Hepatitis B blood test.  Sexually transmitted disease (STD) testing.  Diabetes screening. This is done by checking your blood sugar (glucose) after you have not eaten for a while (fasting). You may have this done every 1-3 years.  Mammogram. This may be done every 1-2 years. Talk to your health care provider about when you should start having regular mammograms. This may depend on whether you  have a family history of breast cancer.  BRCA-related cancer screening. This may be done if you have a family history of breast, ovarian, tubal, or peritoneal cancers.  Pelvic exam and Pap test. This may be done every 3 years starting at age 57.  Starting at age 94, this may be done every 5 years if you have a Pap test in combination with an HPV test.  Bone density scan. This is done to screen for osteoporosis. You may have this scan if you are at high risk for osteoporosis. Discuss your test results, treatment options, and if necessary, the need for more tests with your health care provider. Vaccines  Your health care provider may recommend certain vaccines, such as:  Influenza vaccine. This is recommended every year.  Tetanus, diphtheria, and acellular pertussis (Tdap, Td) vaccine. You may need a Td booster every 10 years.  Zoster vaccine. You may need this after age 57.  Pneumococcal 13-valent conjugate (PCV13) vaccine. You may need this if you have certain conditions and were not previously vaccinated.  Pneumococcal polysaccharide (PPSV23) vaccine. You may need one or two doses if you smoke cigarettes or if you have certain conditions. Talk to your health care provider about which screenings and vaccines you need and how often you need them. This information is not intended to replace advice given to you by your health care provider. Make sure you discuss any questions you have with your health care provider. Document Released: 09/03/2015 Document Revised: 04/26/2016 Document Reviewed: 06/08/2015 Elsevier Interactive Patient Education  2017 [place] Prevention in the Home Falls can cause injuries. They can happen to people of all ages. There are many things you can do to make your home safe and to help prevent falls. What can I do on the outside of my home?  Regularly fix the edges of walkways and driveways and fix any cracks.  Remove anything that might make you trip as you walk through a door, such as a raised step or threshold.  Trim any bushes or trees on the path to your home.  Use bright outdoor lighting.  Clear any walking paths of anything that might make someone trip, such as rocks or  tools.  Regularly check to see if handrails are loose or broken. Make sure that both sides of any steps have handrails.  Any raised decks and porches should have guardrails on the edges.  Have any leaves, snow, or ice cleared regularly.  Use sand or salt on walking paths during winter.  Clean up any spills in your garage right away. This includes oil or grease spills. What can I do in the bathroom?  Use night lights.  Install grab bars by the toilet and in the tub and shower. Do not use towel bars as grab bars.  Use non-skid mats or decals in the tub or shower.  If you need to sit down in the shower, use a plastic, non-slip stool.  Keep the floor dry. Clean up any water that spills on the floor as soon as it happens.  Remove soap buildup in the tub or shower regularly.  Attach bath mats securely with double-sided non-slip rug tape.  Do not have throw rugs and other things on the floor that can make you trip. What can I do in the bedroom?  Use night lights.  Make sure that you have a light by your bed that is easy to reach.  Do not use any sheets or blankets  that are too big for your bed. They should not hang down onto the floor.  Have a firm chair that has side arms. You can use this for support while you get dressed.  Do not have throw rugs and other things on the floor that can make you trip. What can I do in the kitchen?  Clean up any spills right away.  Avoid walking on wet floors.  Keep items that you use a lot in easy-to-reach places.  If you need to reach something above you, use a strong step stool that has a grab bar.  Keep electrical cords out of the way.  Do not use floor polish or wax that makes floors slippery. If you must use wax, use non-skid floor wax.  Do not have throw rugs and other things on the floor that can make you trip. What can I do with my stairs?  Do not leave any items on the stairs.  Make sure that there are handrails on both  sides of the stairs and use them. Fix handrails that are broken or loose. Make sure that handrails are as long as the stairways.  Check any carpeting to make sure that it is firmly attached to the stairs. Fix any carpet that is loose or worn.  Avoid having throw rugs at the top or bottom of the stairs. If you do have throw rugs, attach them to the floor with carpet tape.  Make sure that you have a light switch at the top of the stairs and the bottom of the stairs. If you do not have them, ask someone to add them for you. What else can I do to help prevent falls?  Wear shoes that:  Do not have high heels.  Have rubber bottoms.  Are comfortable and fit you well.  Are closed at the toe. Do not wear sandals.  If you use a stepladder:  Make sure that it is fully opened. Do not climb a closed stepladder.  Make sure that both sides of the stepladder are locked into place.  Ask someone to hold it for you, if possible.  Clearly mark and make sure that you can see:  Any grab bars or handrails.  First and last steps.  Where the edge of each step is.  Use tools that help you move around (mobility aids) if they are needed. These include:  Canes.  Walkers.  Scooters.  Crutches.  Turn on the lights when you go into a dark area. Replace any light bulbs as soon as they burn out.  Set up your furniture so you have a clear path. Avoid moving your furniture around.  If any of your floors are uneven, fix them.  If there are any pets around you, be aware of where they are.  Review your medicines with your doctor. Some medicines can make you feel dizzy. This can increase your chance of falling. Ask your doctor what other things that you can do to help prevent falls. This information is not intended to replace advice given to you by your health care provider. Make sure you discuss any questions you have with your health care provider. Document Released: 06/03/2009 Document Revised:  01/13/2016 Document Reviewed: 09/11/2014 Elsevier Interactive Patient Education  2017 [place].   Influenza (Flu) Vaccine (Inactivated or Recombinant): What You Need to Know 1. Why get vaccinated? Influenza ("flu") is a contagious disease that spreads around the [place] every year, usually between October and May. Flu is caused  by influenza viruses, and is spread mainly by coughing, sneezing, and close contact. Anyone can get flu. Flu strikes suddenly and can last several days. Symptoms vary by age, but can include:  fever/chills  sore throat  muscle aches  fatigue  cough  headache  runny or stuffy nose  Flu can also lead to pneumonia and blood infections, and cause diarrhea and seizures in children. If you have a medical condition, such as heart or lung disease, flu can make it worse. Flu is more dangerous for some people. Infants and young children, people 47 years of age and older, pregnant women, and people with certain health conditions or a weakened immune system are at greatest risk. Each year thousands of people in the [place] die from flu, and many more are hospitalized. Flu vaccine can:  keep you from getting flu,  make flu less severe if you do get it, and  keep you from spreading flu to your family and other people. 2. Inactivated and recombinant flu vaccines A dose of flu vaccine is recommended every flu season. Children 6 months through 13 years of age may need two doses during the same flu season. Everyone else needs only one dose each flu season. Some inactivated flu vaccines contain a very small amount of a mercury-based preservative called thimerosal. Studies have not shown thimerosal in vaccines to be harmful, but flu vaccines that do not contain thimerosal are available. There is no live flu virus in flu shots. They cannot cause the flu. There are many flu viruses, and they are always changing. Each year a new flu vaccine is made to protect  against three or four viruses that are likely to cause disease in the upcoming flu season. But even when the vaccine doesn't exactly match these viruses, it may still provide some protection. Flu vaccine cannot prevent:  flu that is caused by a virus not covered by the vaccine, or  illnesses that look like flu but are not.  It takes about 2 weeks for protection to develop after vaccination, and protection lasts through the flu season. 3. Some people should not get this vaccine Tell the person who is giving you the vaccine:  If you have any severe, life-threatening allergies. If you ever had a life-threatening allergic reaction after a dose of flu vaccine, or have a severe allergy to any part of this vaccine, you may be advised not to get vaccinated. Most, but not all, types of flu vaccine contain a small amount of egg protein.  If you ever had Guillain-Barr Syndrome (also called GBS). Some people with a history of GBS should not get this vaccine. This should be discussed with your doctor.  If you are not feeling well. It is usually okay to get flu vaccine when you have a mild illness, but you might be asked to come back when you feel better.  4. Risks of a vaccine reaction With any medicine, including vaccines, there is a chance of reactions. These are usually mild and go away on their own, but serious reactions are also possible. Most people who get a flu shot do not have any problems with it. Minor problems following a flu shot include:  soreness, redness, or swelling where the shot was given  hoarseness  sore, red or itchy eyes  cough  fever  aches  headache  itching  fatigue  If these problems occur, they usually begin soon after the shot and last 1 or 2 days. More serious problems  following a flu shot can include the following:  There may be a small increased risk of Guillain-Barre Syndrome (GBS) after inactivated flu vaccine. This risk has been estimated at 1 or 2  additional cases per million people vaccinated. This is much lower than the risk of severe complications from flu, which can be prevented by flu vaccine.  Young children who get the flu shot along with pneumococcal vaccine (PCV13) and/or DTaP vaccine at the same time might be slightly more likely to have a seizure caused by fever. Ask your doctor for more information. Tell your doctor if a child who is getting flu vaccine has ever had a seizure.  Problems that could happen after any injected vaccine:  People sometimes faint after a medical procedure, including vaccination. Sitting or lying down for about 15 minutes can help prevent fainting, and injuries caused by a fall. Tell your doctor if you feel dizzy, or have vision changes or ringing in the ears.  Some people get severe pain in the shoulder and have difficulty moving the arm where a shot was given. This happens very rarely.  Any medication can cause a severe allergic reaction. Such reactions from a vaccine are very rare, estimated at about 1 in a million doses, and would happen within a few minutes to a few hours after the vaccination. As with any medicine, there is a very remote chance of a vaccine causing a serious injury or death. The safety of vaccines is always being monitored. For more information, visit: http://www.aguilar.org/ 5. What if there is a serious reaction? What should I look for? Look for anything that concerns you, such as signs of a severe allergic reaction, very high fever, or unusual behavior. Signs of a severe allergic reaction can include hives, swelling of the face and throat, difficulty breathing, a fast heartbeat, dizziness, and weakness. These would start a few minutes to a few hours after the vaccination. What should I do?  If you think it is a severe allergic reaction or other emergency that can't wait, call 9-1-1 and get the person to the nearest hospital. Otherwise, call your doctor.  Reactions should be  reported to the Vaccine Adverse Event Reporting System (VAERS). Your doctor should file this report, or you can do it yourself through the VAERS web site at www.vaers.SamedayNews.es, or by calling 724-752-7110. ? VAERS does not give medical advice. 6. The National Vaccine Injury Compensation Program The [employer] Vaccine Injury Compensation Program (VICP) is a federal program that was created to compensate people who may have been injured by certain vaccines. Persons who believe they may have been injured by a vaccine can learn about the program and about filing a claim by calling (567)389-7487 or visiting the [place] website at GoldCloset.com.ee. There is a time limit to file a claim for compensation. 7. How can I learn more?  Ask your healthcare provider. He or she can give you the vaccine package insert or suggest other sources of information.  Call your local or state health department.  Contact the Centers for Disease Control and Prevention (CDC): ? Call 442-856-4965 (1-800-CDC-INFO) or ? Visit CDC's website at https://gibson.com/ Vaccine Information Statement, Inactivated Influenza Vaccine (03/27/2014) This information is not intended to replace advice given to you by your health care provider. Make sure you discuss any questions you have with your health care provider. Document Released: 06/01/2006 Document Revised: 04/27/2016 Document Reviewed: 04/27/2016 Elsevier Interactive Patient Education  2017 [employer].

## 2018-05-28 ENCOUNTER — Telehealth: Payer: Self-pay

## 2018-05-28 DIAGNOSIS — Z8673 Personal history of transient ischemic attack (TIA), and cerebral infarction without residual deficits: Secondary | ICD-10-CM

## 2018-05-28 NOTE — Telephone Encounter (Signed)
Saw patient for her medicare wellness visit yesterday, she called back today stating she tried calling her neurologist to get her in to discuss her CT scan from 04/2018 and because she hasnt been seen in there office since 2016 that she needed a new referral sent back to them. She is requesting this be done prior to her visit in November with Dr.Crissman. She saw Dr.Ahern at [facility].  Routing to [employer] to see if she can send referral.   Copied from [place] [phone]. Topic: Inquiry >> May 28, 2018  3:04 PM Scherrie Gerlach wrote: Reason for CRM: pt states she saw Makhiya Coburn yesterday and would like to speak back with her about some things that she was advised yesterday

## 2018-05-29 ENCOUNTER — Telehealth: Payer: Self-pay | Admitting: *Deleted

## 2018-05-29 DIAGNOSIS — Z122 Encounter for screening for malignant neoplasm of respiratory organs: Secondary | ICD-10-CM

## 2018-05-29 DIAGNOSIS — Z87891 Personal history of nicotine dependence: Secondary | ICD-10-CM

## 2018-05-29 NOTE — Telephone Encounter (Signed)
Referral placed.

## 2018-05-29 NOTE — Telephone Encounter (Signed)
Received referral for initial lung cancer screening scan. Contacted patient and obtained smoking history,(current, 39 pack year) as well as answering questions related to screening process. Patient denies signs of lung cancer such as weight loss or hemoptysis. Patient denies comorbidity that would prevent curative treatment if lung cancer were found. Patient is scheduled for shared decision making visit and CT scan on 06/04/18 at 1015am.

## 2018-05-29 NOTE — Addendum Note (Signed)
Addended by: Merrie Roof E on: 05/29/2018 08:38 AM   Modules accepted: Orders

## 2018-06-03 ENCOUNTER — Encounter: Payer: Self-pay | Admitting: Oncology

## 2018-06-04 ENCOUNTER — Ambulatory Visit
Admission: RE | Admit: 2018-06-04 | Discharge: 2018-06-04 | Disposition: A | Discharge status: Home / Self Care | Payer: Medicare Other | Source: Ambulatory Visit | Attending: Oncology | Admitting: Oncology

## 2018-06-04 ENCOUNTER — Inpatient Hospital Stay: Payer: Medicare Other | Attending: Oncology | Admitting: Oncology

## 2018-06-04 DIAGNOSIS — D3501 Benign neoplasm of right adrenal gland: Secondary | ICD-10-CM | POA: Diagnosis not present

## 2018-06-04 DIAGNOSIS — Z87891 Personal history of nicotine dependence: Secondary | ICD-10-CM

## 2018-06-04 DIAGNOSIS — J432 Centrilobular emphysema: Secondary | ICD-10-CM | POA: Insufficient documentation

## 2018-06-04 DIAGNOSIS — Z122 Encounter for screening for malignant neoplasm of respiratory organs: Secondary | ICD-10-CM | POA: Insufficient documentation

## 2018-06-04 DIAGNOSIS — R911 Solitary pulmonary nodule: Secondary | ICD-10-CM | POA: Diagnosis not present

## 2018-06-04 DIAGNOSIS — J438 Other emphysema: Secondary | ICD-10-CM | POA: Diagnosis not present

## 2018-06-04 DIAGNOSIS — F17210 Nicotine dependence, cigarettes, uncomplicated: Secondary | ICD-10-CM | POA: Insufficient documentation

## 2018-06-04 DIAGNOSIS — I700 Atherosclerosis of aorta: Secondary | ICD-10-CM | POA: Insufficient documentation

## 2018-06-04 DIAGNOSIS — I2510 Atherosclerotic heart disease of native coronary artery without angina pectoris: Secondary | ICD-10-CM | POA: Insufficient documentation

## 2018-06-04 NOTE — Progress Notes (Signed)
In accordance with CMS guidelines, patient has met eligibility criteria including age, absence of signs or symptoms of lung cancer.  Social History   Tobacco Use  . Smoking status: Current Every Day Smoker    Packs/day: 1.00    Years: 39.00    Pack years: 39.00    Types: Cigarettes  . Smokeless tobacco: Never Used  Substance Use Topics  . Alcohol use: No    Alcohol/week: 0.0 standard drinks  . Drug use: No     A shared decision-making session was conducted prior to the performance of CT scan. This includes one or more decision aids, includes benefits and harms of screening, follow-up diagnostic testing, over-diagnosis, false positive rate, and total radiation exposure.  Counseling on the importance of adherence to annual lung cancer LDCT screening, impact of co-morbidities, and ability or willingness to undergo diagnosis and treatment is imperative for compliance of the program.  Counseling on the importance of continued smoking cessation for former smokers; the importance of smoking cessation for current smokers, and information about tobacco cessation interventions have been given to patient including [place] and [address] quit [place] programs.  Written order for lung cancer screening with LDCT has been given to the patient and any and all questions have been answered to the best of my abilities.   Yearly follow up will be coordinated by [name], Thoracic Navigator.  Faythe Casa, NP 06/04/2018 10:32 AM

## 2018-06-05 ENCOUNTER — Encounter: Payer: Self-pay | Admitting: *Deleted

## 2018-06-06 ENCOUNTER — Telehealth: Payer: Self-pay | Admitting: *Deleted

## 2018-06-06 NOTE — Telephone Encounter (Signed)
Patient called to review results of scan.  Notified patient of LDCT lung cancer screening program results with recommendation for 12 month follow up imaging.  Also notified of incidental findings noted below and is encouraged to discuss further questions with PCP who will receive a copy of this not and/or the CT reports.  Patient verbalized understanding.    IMPRESSION: 1. Lung-RADS 2S, benign appearance or behavior. Continue annual screening with low-dose chest CT without contrast in 12 months. 2. The "S" modifier above refers to potentially clinically significant non lung cancer related findings. Specifically, there is aortic atherosclerosis, in addition to right coronary artery disease. Please note that although the presence of coronary artery calcium documents the presence of coronary artery disease, the severity of this disease and any potential stenosis cannot be assessed on this non-gated CT examination. Assessment for potential risk factor modification, dietary therapy or pharmacologic therapy may be warranted, if clinically indicated. 3. Mild diffuse bronchial wall thickening with mild centrilobular and paraseptal emphysema; imaging findings suggestive of underlying COPD. 4. 1.9 x 1.3 cm right adrenal adenoma.  Aortic Atherosclerosis (ICD10-I70.0) and Emphysema (ICD10-J43.9).

## 2018-06-12 ENCOUNTER — Observation Stay (HOSPITAL_BASED_OUTPATIENT_CLINIC_OR_DEPARTMENT_OTHER)
Admit: 2018-06-12 | Discharge: 2018-06-12 | Disposition: A | Discharge status: Home / Self Care | Payer: Medicare Other | Attending: Internal Medicine | Admitting: Internal Medicine

## 2018-06-12 ENCOUNTER — Observation Stay: Payer: Medicare Other

## 2018-06-12 ENCOUNTER — Emergency Department: Payer: Medicare Other

## 2018-06-12 ENCOUNTER — Observation Stay
Admission: EM | Admit: 2018-06-12 | Discharge: 2018-06-13 | Disposition: A | Discharge status: Home / Self Care | Payer: Medicare Other | Attending: Internal Medicine | Admitting: Internal Medicine

## 2018-06-12 ENCOUNTER — Other Ambulatory Visit: Payer: Self-pay

## 2018-06-12 DIAGNOSIS — Z90710 Acquired absence of both cervix and uterus: Secondary | ICD-10-CM | POA: Diagnosis not present

## 2018-06-12 DIAGNOSIS — W19XXXA Unspecified fall, initial encounter: Secondary | ICD-10-CM | POA: Insufficient documentation

## 2018-06-12 DIAGNOSIS — K219 Gastro-esophageal reflux disease without esophagitis: Secondary | ICD-10-CM | POA: Insufficient documentation

## 2018-06-12 DIAGNOSIS — F329 Major depressive disorder, single episode, unspecified: Secondary | ICD-10-CM | POA: Diagnosis not present

## 2018-06-12 DIAGNOSIS — I081 Rheumatic disorders of both mitral and tricuspid valves: Secondary | ICD-10-CM | POA: Insufficient documentation

## 2018-06-12 DIAGNOSIS — F17210 Nicotine dependence, cigarettes, uncomplicated: Secondary | ICD-10-CM | POA: Diagnosis not present

## 2018-06-12 DIAGNOSIS — R51 Headache: Secondary | ICD-10-CM | POA: Diagnosis not present

## 2018-06-12 DIAGNOSIS — F419 Anxiety disorder, unspecified: Secondary | ICD-10-CM | POA: Diagnosis not present

## 2018-06-12 DIAGNOSIS — Z880 Allergy status to penicillin: Secondary | ICD-10-CM | POA: Insufficient documentation

## 2018-06-12 DIAGNOSIS — I110 Hypertensive heart disease with heart failure: Secondary | ICD-10-CM | POA: Insufficient documentation

## 2018-06-12 DIAGNOSIS — Z800 Family history of malignant neoplasm of digestive organs: Secondary | ICD-10-CM | POA: Insufficient documentation

## 2018-06-12 DIAGNOSIS — E785 Hyperlipidemia, unspecified: Secondary | ICD-10-CM | POA: Insufficient documentation

## 2018-06-12 DIAGNOSIS — Z8673 Personal history of transient ischemic attack (TIA), and cerebral infarction without residual deficits: Secondary | ICD-10-CM | POA: Insufficient documentation

## 2018-06-12 DIAGNOSIS — J449 Chronic obstructive pulmonary disease, unspecified: Secondary | ICD-10-CM | POA: Diagnosis not present

## 2018-06-12 DIAGNOSIS — I959 Hypotension, unspecified: Secondary | ICD-10-CM | POA: Diagnosis not present

## 2018-06-12 DIAGNOSIS — Z825 Family history of asthma and other chronic lower respiratory diseases: Secondary | ICD-10-CM | POA: Insufficient documentation

## 2018-06-12 DIAGNOSIS — I5030 Unspecified diastolic (congestive) heart failure: Secondary | ICD-10-CM | POA: Insufficient documentation

## 2018-06-12 DIAGNOSIS — H538 Other visual disturbances: Secondary | ICD-10-CM | POA: Diagnosis not present

## 2018-06-12 DIAGNOSIS — R0789 Other chest pain: Secondary | ICD-10-CM | POA: Diagnosis not present

## 2018-06-12 DIAGNOSIS — I340 Nonrheumatic mitral (valve) insufficiency: Secondary | ICD-10-CM

## 2018-06-12 DIAGNOSIS — M797 Fibromyalgia: Secondary | ICD-10-CM | POA: Insufficient documentation

## 2018-06-12 DIAGNOSIS — Z8249 Family history of ischemic heart disease and other diseases of the circulatory system: Secondary | ICD-10-CM | POA: Insufficient documentation

## 2018-06-12 DIAGNOSIS — Z90721 Acquired absence of ovaries, unilateral: Secondary | ICD-10-CM | POA: Insufficient documentation

## 2018-06-12 DIAGNOSIS — S0990XA Unspecified injury of head, initial encounter: Secondary | ICD-10-CM | POA: Diagnosis not present

## 2018-06-12 DIAGNOSIS — Z91041 Radiographic dye allergy status: Secondary | ICD-10-CM | POA: Insufficient documentation

## 2018-06-12 DIAGNOSIS — Z8052 Family history of malignant neoplasm of bladder: Secondary | ICD-10-CM | POA: Insufficient documentation

## 2018-06-12 DIAGNOSIS — Z823 Family history of stroke: Secondary | ICD-10-CM | POA: Diagnosis not present

## 2018-06-12 DIAGNOSIS — Z79899 Other long term (current) drug therapy: Secondary | ICD-10-CM | POA: Insufficient documentation

## 2018-06-12 DIAGNOSIS — I700 Atherosclerosis of aorta: Secondary | ICD-10-CM | POA: Insufficient documentation

## 2018-06-12 DIAGNOSIS — R531 Weakness: Secondary | ICD-10-CM | POA: Diagnosis not present

## 2018-06-12 DIAGNOSIS — K589 Irritable bowel syndrome without diarrhea: Secondary | ICD-10-CM | POA: Diagnosis not present

## 2018-06-12 DIAGNOSIS — Z7982 Long term (current) use of aspirin: Secondary | ICD-10-CM | POA: Insufficient documentation

## 2018-06-12 DIAGNOSIS — S299XXA Unspecified injury of thorax, initial encounter: Secondary | ICD-10-CM | POA: Diagnosis not present

## 2018-06-12 DIAGNOSIS — R55 Syncope and collapse: Secondary | ICD-10-CM | POA: Insufficient documentation

## 2018-06-12 LAB — CBC WITH DIFFERENTIAL/PLATELET
Abs Immature Granulocytes: 0.01 10*3/uL (ref 0.00–0.07)
BASOS ABS: 0 10*3/uL (ref 0.0–0.1)
BASOS PCT: 1 %
EOS ABS: 0.1 10*3/uL (ref 0.0–0.5)
EOS PCT: 1 %
HCT: 43.4 % (ref 36.0–46.0)
Hemoglobin: 14.4 g/dL (ref 12.0–15.0)
IMMATURE GRANULOCYTES: 0 %
LYMPHS ABS: 1.6 10*3/uL (ref 0.7–4.0)
Lymphocytes Relative: 41 %
MCH: 29.9 pg (ref 26.0–34.0)
MCHC: 33.2 g/dL (ref 30.0–36.0)
MCV: 90 fL (ref 80.0–100.0)
Monocytes Absolute: 0.4 10*3/uL (ref 0.1–1.0)
Monocytes Relative: 10 %
NEUTROS PCT: 47 %
NRBC: 0 % (ref 0.0–0.2)
Neutro Abs: 1.8 10*3/uL (ref 1.7–7.7)
PLATELETS: 284 10*3/uL (ref 150–400)
RBC: 4.82 MIL/uL (ref 3.87–5.11)
RDW: 12.8 % (ref 11.5–15.5)
WBC: 3.8 10*3/uL — AB (ref 4.0–10.5)

## 2018-06-12 LAB — URINALYSIS, COMPLETE (UACMP) WITH MICROSCOPIC
Bilirubin Urine: NEGATIVE
Glucose, UA: NEGATIVE mg/dL
Ketones, ur: 15 mg/dL — AB
Leukocytes, UA: NEGATIVE
Nitrite: NEGATIVE
Protein, ur: NEGATIVE mg/dL
SPECIFIC GRAVITY, URINE: 1.02 (ref 1.005–1.030)
pH: 6.5 (ref 5.0–8.0)

## 2018-06-12 LAB — BASIC METABOLIC PANEL
ANION GAP: 5 (ref 5–15)
BUN: 10 mg/dL (ref 6–20)
CALCIUM: 8.7 mg/dL — AB (ref 8.9–10.3)
CO2: 30 mmol/L (ref 22–32)
Chloride: 106 mmol/L (ref 98–111)
Creatinine, Ser: 0.85 mg/dL (ref 0.44–1.00)
Glucose, Bld: 82 mg/dL (ref 70–99)
Potassium: 4.1 mmol/L (ref 3.5–5.1)
SODIUM: 141 mmol/L (ref 135–145)

## 2018-06-12 LAB — TROPONIN I: Troponin I: <0.03 ng/mL (ref <0.03)

## 2018-06-12 MED ORDER — SUCRALFATE 1 G PO TABS
1.0000 g | ORAL_TABLET | Freq: Four times a day (QID) | ORAL | Status: DC
  Administered 2018-06-12: 18:00:00 1 g via ORAL
  Filled 2018-06-12 (×3): qty 1

## 2018-06-12 MED ORDER — ASPIRIN EC 81 MG PO TBEC
81.0000 mg | DELAYED_RELEASE_TABLET | Freq: Every day | ORAL | Status: DC
  Administered 2018-06-13: 11:00:00 81 mg via ORAL
  Filled 2018-06-12: qty 1

## 2018-06-12 MED ORDER — SODIUM CHLORIDE 0.9 % IV SOLN
INTRAVENOUS | Status: DC
  Administered 2018-06-12: 21:00:00 via INTRAVENOUS

## 2018-06-12 MED ORDER — ACETAMINOPHEN 160 MG/5ML PO SOLN
650.0000 mg | ORAL | Status: DC | PRN
  Filled 2018-06-12: qty 20.3

## 2018-06-12 MED ORDER — ACETAMINOPHEN 325 MG PO TABS: 650.0000 mg | ORAL_TABLET | ORAL | Status: DC | PRN

## 2018-06-12 MED ORDER — NITROGLYCERIN 0.4 MG SL SUBL: 0.4000 mg | SUBLINGUAL_TABLET | SUBLINGUAL | Status: DC | PRN

## 2018-06-12 MED ORDER — ESTRADIOL 1 MG PO TABS
1.0000 mg | ORAL_TABLET | Freq: Every day | ORAL | Status: DC
  Administered 2018-06-13: 11:00:00 1 mg via ORAL
  Filled 2018-06-12: qty 1

## 2018-06-12 MED ORDER — ATORVASTATIN CALCIUM 20 MG PO TABS
80.0000 mg | ORAL_TABLET | Freq: Every day | ORAL | Status: DC
  Administered 2018-06-12 – 2018-06-13 (×2): 80 mg via ORAL
  Filled 2018-06-12 (×2): qty 4

## 2018-06-12 MED ORDER — ENOXAPARIN SODIUM 40 MG/0.4ML ~~LOC~~ SOLN
40.0000 mg | SUBCUTANEOUS | Status: DC
  Administered 2018-06-12: 40 mg via SUBCUTANEOUS
  Filled 2018-06-12: qty 0.4

## 2018-06-12 MED ORDER — FLUOXETINE HCL 20 MG PO CAPS
20.0000 mg | ORAL_CAPSULE | Freq: Every day | ORAL | Status: DC
  Administered 2018-06-13: 20 mg via ORAL
  Filled 2018-06-12: qty 1

## 2018-06-12 MED ORDER — METOCLOPRAMIDE HCL 5 MG/ML IJ SOLN
10.0000 mg | Freq: Once | INTRAMUSCULAR | Status: AC
  Administered 2018-06-12: 10 mg via INTRAVENOUS
  Filled 2018-06-12: qty 2

## 2018-06-12 MED ORDER — PANTOPRAZOLE SODIUM 40 MG PO TBEC
40.0000 mg | DELAYED_RELEASE_TABLET | Freq: Every day | ORAL | Status: DC
  Filled 2018-06-12 (×2): qty 1

## 2018-06-12 MED ORDER — PANTOPRAZOLE SODIUM 40 MG PO TBEC: 40.0000 mg | DELAYED_RELEASE_TABLET | Freq: Every day | ORAL | Status: DC

## 2018-06-12 MED ORDER — CLONAZEPAM 0.5 MG PO TABS
1.0000 mg | ORAL_TABLET | Freq: Two times a day (BID) | ORAL | Status: DC
  Administered 2018-06-12 – 2018-06-13 (×2): 1 mg via ORAL
  Filled 2018-06-12 (×2): qty 2

## 2018-06-12 MED ORDER — STROKE: EARLY STAGES OF RECOVERY BOOK
Freq: Once | Status: AC
  Administered 2018-06-12: 21:00:00

## 2018-06-12 MED ORDER — ACETAMINOPHEN 650 MG RE SUPP: 650.0000 mg | RECTAL | Status: DC | PRN

## 2018-06-12 MED ORDER — DIPHENHYDRAMINE HCL 50 MG/ML IJ SOLN
25.0000 mg | Freq: Once | INTRAMUSCULAR | Status: AC
  Administered 2018-06-12: 25 mg via INTRAVENOUS
  Filled 2018-06-12: qty 1

## 2018-06-12 MED ORDER — SODIUM CHLORIDE 0.9 % IV BOLUS
1000.0000 mL | Freq: Once | INTRAVENOUS | Status: AC
  Administered 2018-06-12: 1000 mL via INTRAVENOUS

## 2018-06-12 NOTE — ED Notes (Signed)
[facility] called for transport

## 2018-06-12 NOTE — ED Triage Notes (Addendum)
Pt comes via [facility] from home with c/o weakness and fall. Family found pt on floor and may have been down for couple hours. Pt unsure how she fell but thinks it is because her head was hurting so bad. Pt unable to recall if she hit her head or LOC. Pt take1aspirin daily. EMS reports negative stroke screen, VSS, EKG normal. Pt does have hx of migraines. Pt is A&OX4.  EMS also reports pt stated to them that yesterday she was having some chest pressure and took 1 nitro. Pt states it relieved the pressure.

## 2018-06-12 NOTE — ED Notes (Addendum)
Pt up to toilet, unable to get urine at this time.

## 2018-06-12 NOTE — ED Notes (Signed)
Patient transported to room 101

## 2018-06-12 NOTE — H&P (Signed)
[facility] NAME: Lindsey Bautista    MR#:  [id number]  DATE OF BIRTH:  10-May-1961  DATE OF ADMISSION:  06/12/2018  PRIMARY CARE PHYSICIAN: [name], MD   REQUESTING/REFERRING PHYSICIAN: Dr. Joni Fears  CHIEF COMPLAINT: Right-sided weakness   Chief Complaint  Patient presents with  . Weakness  . Fall    HISTORY OF PRESENT ILLNESS:  Lindsey Bautista  is a 57 y.o. female with a known history of hypertension, anxiety, GERD, depression came in because of a fall today and patient was too weak to get up and she was lying on the floor until family could get there and help her.  Patient complains of headache, right-sided weakness, blurred vision since morning.  She also complained of chest tightness yesterday for which family gave her nitroglycerin.  Now her main complaint is right-sided weakness, headache, blurred vision, generalized weakness. PAST MEDICAL HISTORY:   Past Medical History:  Diagnosis Date  . Atypical chest pain    a. 08/2016 Neg MV.  [name] COPD (chronic obstructive pulmonary disease) ([place])   . Depression   . Diastolic dysfunction    a. 10/2016 Echo: EF 60-65%, no rwma, Gr1 DD, mild MR, nl LA size, nl RV fxn.  [name] GERD (gastroesophageal reflux disease)   . Hypotension   . IBS (irritable bowel syndrome)   . Right arm weakness   . Situational syncope   . TIA (transient ischemic attack)     PAST SURGICAL HISTOIRY:   Past Surgical History:  Procedure Laterality Date  . COLONOSCOPY WITH PROPOFOL N/A 11/22/2015   Procedure: COLONOSCOPY WITH PROPOFOL;  Surgeon: Manya Silvas, MD;  Location: [facility] ENDOSCOPY;  Service: Endoscopy;  Laterality: N/A;  . ESOPHAGOGASTRODUODENOSCOPY (EGD) WITH PROPOFOL N/A 11/22/2015   Procedure: ESOPHAGOGASTRODUODENOSCOPY (EGD) WITH PROPOFOL;  Surgeon: Manya Silvas, MD;  Location: [facility] ENDOSCOPY;  Service: Endoscopy;  Laterality: N/A;  . OOPHORECTOMY    . TONSILLECTOMY    . VAGINAL  HYSTERECTOMY      SOCIAL HISTORY:   Social History   Tobacco Use  . Smoking status: Current Every Day Smoker    Packs/day: 1.00    Years: 39.00    Pack years: 39.00    Types: Cigarettes  . Smokeless tobacco: Never Used  Substance Use Topics  . Alcohol use: No    Alcohol/week: 0.0 standard drinks    FAMILY HISTORY:   Family History  Problem Relation Age of Onset  . Bladder Cancer Mother   . Heart disease Unknown   . COPD Unknown   . Stroke Unknown   . Breast cancer Paternal Aunt 77  . Colon cancer Brother     DRUG ALLERGIES:   Allergies  Allergen Reactions  . Penicillin G Anaphylaxis and Other (See Comments)    Has patient had a PCN reaction causing immediate rash, facial/tongue/throat swelling, SOB or lightheadedness with hypotension: [id number]:[id number]} Has patient had a PCN reaction causing severe rash involving mucus membranes or skin necrosis: no:[id number]} Has patient had a PCN reaction that required hospitalization no:[id number]} Has patient had a PCN reaction occurring within the last 10 years: SWF:[id number]} If all of the above answers are "NO", then may proceed with Cephalosporin use.   . Iodinated Diagnostic Agents Rash and Nausea And Vomiting    REVIEW OF SYSTEMS:  CONSTITUTIONAL: Fall, generalized weakness eYES: Complains of blurred vision today. EARS, NOSE, AND THROAT: No tinnitus or ear pain.  RESPIRATORY: No cough, shortness of breath, wheezing or  hemoptysis.  CARDIOVASCULAR: No chest pain, orthopnea, edema.  GASTROINTESTINAL: No nausea, vomiting, diarrhea or abdominal pain.  GENITOURINARY: No dysuria, hematuria.  ENDOCRINE: No polyuria, nocturia,  HEMATOLOGY: No anemia, easy bruising or bleeding SKIN: No rash or lesion. MUSCULOSKELETAL: No joint pain or arthritis.   NEUROLOGIC: No tingling, numbness, weakness.  PSYCHIATRY: Depression.  MEDICATIONS AT HOME:   Prior to Admission medications   Medication Sig Start Date End Date Taking? Authorizing  Provider  aspirin 81 MG tablet Take 81 mg by mouth daily.   Yes [provider]  clonazePAM (KLONOPIN) 1 MG tablet TAKE 1 TABLET BY MOUTH TWICE DAILY 02/20/18  Yes Crissman, Jeannette How, MD  estradiol (ESTRACE) 1 MG tablet TAKE 1 TABLET BY MOUTH ONCE DAILY 05/20/18  Yes Crissman, Jeannette How, MD  FLUoxetine (PROZAC) 20 MG capsule TAKE 1 CAPSULE BY MOUTH ONCE DAILY 05/22/18  Yes Crissman, Jeannette How, MD  atorvastatin (LIPITOR) 80 MG tablet TAKE ONE TABLET BY MOUTH ONCE DAILY Patient not taking: Reported on 05/27/2018 07/05/17   Minna Merritts, MD  dicyclomine (BENTYL) 20 MG tablet Take 1 tablet (20 mg total) by mouth 3 (three) times daily as needed (abdominal pain). Patient not taking: Reported on 06/12/2018 03/11/18   Nance Pear, MD  docusate sodium (COLACE) 100 MG capsule Take 1 capsule (100 mg total) by mouth daily as needed. Patient not taking: Reported on 05/27/2018 03/10/18 03/10/19  Orbie Pyo, MD  nitroGLYCERIN (NITROSTAT) 0.4 MG SL tablet Place 1 tablet (0.4 mg total) under the tongue every 5 (five) minutes as needed for chest pain. Patient not taking: Reported on 05/27/2018 12/04/16   [name], MD  pantoprazole (PROTONIX) 40 MG tablet Take 1 tablet (40 mg total) by mouth daily. Patient not taking: Reported on 05/27/2018 05/09/17   Volney American, PA-C  sucralfate (CARAFATE) 1 g tablet Take 1 tablet (1 g total) by mouth 4 (four) times daily. Patient not taking: Reported on 06/12/2018 03/11/18   Nance Pear, MD      VITAL SIGNS:  Blood pressure 129/74, pulse 64, temperature 98.1 F (36.7 C), temperature source Oral, resp. rate 17, height 5\' 2"  (1.575 m), weight 64.9 kg, SpO2 97 %.  PHYSICAL EXAMINATION:  GENERAL:  57 y.o.-year-old patient lying in the bed with no acute distress.  Patient appears older than stated age. EYES: Pupils equal, round, reactive to light and accommodation. No scleral icterus. Extraocular muscles intact.  HEENT: Head atraumatic,  normocephalic. Oropharynx and nasopharynx clear.  NECK:  Supple, no jugular venous distention. No thyroid enlargement, no tenderness.  LUNGS: Normal breath sounds bilaterally, no wheezing, rales,rhonchi or crepitation. No use of accessory muscles of respiration.  CARDIOVASCULAR: S1, S2 normal. No murmurs, rubs, or gallops.  ABDOMEN: Soft, nontender, nondistended. Bowel sounds present. No organomegaly or mass.  EXTREMITIES: No pedal edema, cyanosis, or clubbing.  NEUROLOGIC: Cranial nerves II through XII are intact. Muscle strength 4/5 on right side, 5/5 on the left side/ sensation intact. Gait not checked.  PSYCHIATRIC: The patient is alert and oriented x 3.  SKIN: No obvious rash, lesion, or ulcer.   LABORATORY PANEL:   CBC Recent Labs  Lab 06/12/18 1237  WBC 3.8*  HGB 14.4  HCT 43.4  PLT 284   ------------------------------------------------------------------------------------------------------------------  Chemistries  Recent Labs  Lab 06/12/18 1237  NA 141  K 4.1  CL 106  CO2 30  GLUCOSE 82  BUN 10  CREATININE 0.85  CALCIUM 8.7*   ------------------------------------------------------------------------------------------------------------------  Cardiac Enzymes Recent Labs  Lab 06/12/18 1237  TROPONINI <0.03   ------------------------------------------------------------------------------------------------------------------  RADIOLOGY:  Dg Chest 1 View  Result Date: 06/12/2018 CLINICAL DATA:  57 year old female found down after fall. EXAM: CHEST  1 VIEW COMPARISON:  Chest CT 06/04/2018 and earlier. FINDINGS: Portable AP upright view at 1407 hours. Stable lung volumes and mediastinal contours are within normal limits. Allowing for portable technique the lungs are clear. Visualized tracheal air column is within normal limits. Negative visible bowel gas pattern. No acute osseous abnormality identified. IMPRESSION: No acute cardiopulmonary abnormality or acute  traumatic injury identified. Electronically Signed   By: Genevie Ann M.D.   On: 06/12/2018 14:23   Ct Head Wo Contrast  Result Date: 06/12/2018 CLINICAL DATA:  Fall EXAM: CT HEAD WITHOUT CONTRAST TECHNIQUE: Contiguous axial images were obtained from the base of the skull through the vertex without intravenous contrast. COMPARISON:  CT 05/15/2018, MRI 03/12/2017 FINDINGS: Brain: No acute territorial infarction, hemorrhage or intracranial mass. Stable ventricle size. Minimal small vessel ischemic changes of the white matter. Vascular: No hyperdense vessels.  Carotid vascular calcification. Skull: Normal. Negative for fracture or focal lesion. Sinuses/Orbits: No acute finding. Other: None IMPRESSION: 1. No CT evidence for acute intracranial abnormality. 2. Minimal small vessel ischemic changes of the white matter. Electronically Signed   By: Donavan Foil M.D.   On: 06/12/2018 14:46    EKG:   Orders placed or performed during the hospital encounter of 06/12/18  . EKG 12-Lead  . EKG 12-Lead  . EKG 12-Lead  . EKG 12-Lead    IMPRESSION AND PLAN:   57 year old female with history of depression, GERD, IBS, previous TIA comes in with right-sided weakness, headache, fall.  [place] Acute right-sided weakness, headache evaluate for TIA, admitted to stroke unit, CT head did not show any acute intracranial abnormalities, minimal small ischemic changes of the white matter.  Get an MRI of the brain, ultrasound of carotids, echocardiogram, physical therapy evaluation, patient passed swallow screen so we will start her on a healthy diet.  She has history of chronic right-sided weakness but she said that she is feeling more weak today. Continue aspirin, statins  #2 fibromyalgia, anxiety, depression: They may be playing a role in her present complaint as well, patient is on clonazepam as per PCP note to continue Klonopin, Cymbalta.. #3 generalized weakness, fall today, physical therapy to evaluate the patient.  #4  history of previous TIA: Patient is on aspirin, statins: Continue them.  disCussed with husband. All the records are reviewed and case discussed with ED provider. Management plans discussed with the patient, family and they are in agreement.  CODE STATUS:   TOTAL TIME TAKING CARE OF THIS PATIENT: 2minutes.    Epifanio Lesches M.D on 06/12/2018 at 4:35 PM  Between 7am to 6pm - Pager - [phone]  After 6pm go to [email or web address] - password [email or web address] [facility]  Office  [phone]  CC: Primary care physician; [name], MD  Note: This dictation was prepared with Dragon dictation along with smaller phrase technology. Any transcriptional errors that result from this process are unintentional.

## 2018-06-12 NOTE — ED Provider Notes (Signed)
[facility] Emergency Department Provider Note  ____________________________________________  Time seen: Approximately 2:55 PM  I have reviewed the triage vital signs and the nursing notes.   HISTORY  Chief Complaint Weakness and Fall    HPI Lindsey Bautista is a 57 y.o. female with a history of depression GERD IBS and TIA who complains of right-sided weakness and headache that started 2 days ago.  Caused her to fall today, being too weak to get up and laying on the floor for a few hours until family could help her..  No aggravating or alleviating factors.  Complains of decreased right-sided vision as well.  Symptoms are constant and nonradiating.  She also complains of central chest tightness yesterday that started while at rest.  No aggravating or alleviating factors, nonradiating.  Husband gave her a nitroglycerin that she is prescribed which relieved the pressure.  No vomiting or diaphoresis at that time.      Past Medical History:  Diagnosis Date  . Atypical chest pain    a. 08/2016 Neg MV.  [name] COPD (chronic obstructive pulmonary disease) ([place])   . Depression   . Diastolic dysfunction    a. 10/2016 Echo: EF 60-65%, no rwma, Gr1 DD, mild MR, nl LA size, nl RV fxn.  [name] GERD (gastroesophageal reflux disease)   . Hypotension   . IBS (irritable bowel syndrome)   . Right arm weakness   . Situational syncope   . TIA (transient ischemic attack)      Patient Active Problem List   Diagnosis Date Noted  . Fibromyalgia 08/29/2017  . Right sided weakness 11/16/2016  . Conversion disorder with mixed symptoms 03/30/2016  . Abdominal aortic atherosclerosis ([place]) 12/29/2015  . Adrenal gland cyst ([place]) 12/29/2015  . Anxiety state   . Syncope and collapse   . COPD (chronic obstructive pulmonary disease) ([place]) 12/13/2015  . Chest pain 12/13/2015  . Gastroesophageal reflux disease without esophagitis   . PAD (peripheral artery disease) ([place]) 10/22/2015  .  Hyperlipidemia 10/22/2015  . Cervical paraspinal muscle spasm 09/30/2015  . Benzodiazepine dependence, continuous ([place]) 09/08/2015  . Acute anxiety 06/07/2015  . Depression 06/07/2015  . Situational syncope 06/07/2015  . Smoker 04/21/2015     Past Surgical History:  Procedure Laterality Date  . COLONOSCOPY WITH PROPOFOL N/A 11/22/2015   Procedure: COLONOSCOPY WITH PROPOFOL;  Surgeon: Manya Silvas, MD;  Location: [facility] ENDOSCOPY;  Service: Endoscopy;  Laterality: N/A;  . ESOPHAGOGASTRODUODENOSCOPY (EGD) WITH PROPOFOL N/A 11/22/2015   Procedure: ESOPHAGOGASTRODUODENOSCOPY (EGD) WITH PROPOFOL;  Surgeon: Manya Silvas, MD;  Location: [facility] ENDOSCOPY;  Service: Endoscopy;  Laterality: N/A;  . OOPHORECTOMY    . TONSILLECTOMY    . VAGINAL HYSTERECTOMY       Prior to Admission medications   Medication Sig Start Date End Date Taking? Authorizing Provider  aspirin 81 MG tablet Take 81 mg by mouth daily.   Yes [provider]  clonazePAM (KLONOPIN) 1 MG tablet TAKE 1 TABLET BY MOUTH TWICE DAILY 02/20/18  Yes Crissman, Jeannette How, MD  estradiol (ESTRACE) 1 MG tablet TAKE 1 TABLET BY MOUTH ONCE DAILY 05/20/18  Yes Crissman, Jeannette How, MD  FLUoxetine (PROZAC) 20 MG capsule TAKE 1 CAPSULE BY MOUTH ONCE DAILY 05/22/18  Yes Crissman, Jeannette How, MD  atorvastatin (LIPITOR) 80 MG tablet TAKE ONE TABLET BY MOUTH ONCE DAILY Patient not taking: Reported on 05/27/2018 07/05/17   Minna Merritts, MD  dicyclomine (BENTYL) 20 MG tablet Take 1 tablet (20 mg total) by  mouth 3 (three) times daily as needed (abdominal pain). Patient not taking: Reported on 06/12/2018 03/11/18   Nance Pear, MD  docusate sodium (COLACE) 100 MG capsule Take 1 capsule (100 mg total) by mouth daily as needed. Patient not taking: Reported on 05/27/2018 03/10/18 03/10/19  Orbie Pyo, MD  nitroGLYCERIN (NITROSTAT) 0.4 MG SL tablet Place 1 tablet (0.4 mg total) under the tongue every 5 (five) minutes as needed for chest  pain. Patient not taking: Reported on 05/27/2018 12/04/16   [name], MD  pantoprazole (PROTONIX) 40 MG tablet Take 1 tablet (40 mg total) by mouth daily. Patient not taking: Reported on 05/27/2018 05/09/17   Volney American, PA-C  sucralfate (CARAFATE) 1 g tablet Take 1 tablet (1 g total) by mouth 4 (four) times daily. Patient not taking: Reported on 06/12/2018 03/11/18   Nance Pear, MD     Allergies Penicillin g and Iodinated diagnostic agents   Family History  Problem Relation Age of Onset  . Bladder Cancer Mother   . Heart disease Unknown   . COPD Unknown   . Stroke Unknown   . Breast cancer Paternal Aunt 55  . Colon cancer Brother     Social History Social History   Tobacco Use  . Smoking status: Current Every Day Smoker    Packs/day: 1.00    Years: 39.00    Pack years: 39.00    Types: Cigarettes  . Smokeless tobacco: Never Used  Substance Use Topics  . Alcohol use: No    Alcohol/week: 0.0 standard drinks  . Drug use: No    Review of Systems  Constitutional:   No fever or chills.  ENT:   No sore throat. No rhinorrhea. Cardiovascular:   Positive as above chest pain without syncope. Respiratory:   No dyspnea or cough. Gastrointestinal:   Negative for abdominal pain, vomiting and diarrhea.  Musculoskeletal:   Negative for focal pain or swelling All other systems reviewed and are negative except as documented above in ROS and HPI.  ____________________________________________   PHYSICAL EXAM:  VITAL SIGNS: ED Triage Vitals  Enc Vitals Group     BP 06/12/18 1225 135/71     Pulse Rate 06/12/18 1225 (!) 56     Resp 06/12/18 1225 17     Temp 06/12/18 1225 98.1 F (36.7 C)     Temp Source 06/12/18 1225 Oral     SpO2 06/12/18 1225 98 %     Weight 06/12/18 1224 143 lb (64.9 kg)     Height 06/12/18 1224 5\' 2"  (1.575 m)     Head Circumference --      Peak Flow --      Pain Score 06/12/18 1224 8     Pain Loc --      Pain Edu? --      Excl.  in [place]? --     Vital signs reviewed, nursing assessments reviewed.   Constitutional:   Alert and oriented.  Ill-appearing Eyes:   Conjunctivae are normal. EOMI. PERRL. ENT      Head:   Normocephalic and atraumatic.      Nose:   No congestion/rhinnorhea.       Mouth/Throat:   Dry mucous membranes, no pharyngeal erythema. No peritonsillar mass.       Neck:   No meningismus. Full ROM. Hematological/Lymphatic/Immunilogical:   No cervical lymphadenopathy. Cardiovascular:   The cardia heart rate 50. Symmetric bilateral radial and DP pulses.  No murmurs. Cap refill less than 2 seconds.  Respiratory:   Normal respiratory effort without tachypnea/retractions. Breath sounds are clear and equal bilaterally. No wheezes/rales/rhonchi. Gastrointestinal:   Soft and nontender. Non distended. There is no CVA tenderness.  No rebound, rigidity, or guarding.  Musculoskeletal:   Normal range of motion in all extremities. No joint effusions.  No lower extremity tenderness.  No edema. Neurologic:   Normal speech and language.  Right facial droop Weakness in right arm and right leg Cerebellar testing normal NIH stroke scale 4   Skin:    Skin is warm, dry and intact. No rash noted.  No petechiae, purpura, or bullae.  ____________________________________________    LABS (pertinent positives/negatives) (all labs ordered are listed, but only abnormal results are displayed) Labs Reviewed  BASIC METABOLIC PANEL - Abnormal; Notable for the following components:      Result Value   Calcium 8.7 (*)    All other components within normal limits  CBC WITH DIFFERENTIAL/PLATELET - Abnormal; Notable for the following components:   WBC 3.8 (*)    All other components within normal limits  URINE CULTURE  TROPONIN I  URINALYSIS, COMPLETE (UACMP) WITH MICROSCOPIC   ____________________________________________   EKG  Interpreted by me Sinus rhythm rate of 58, normal axis intervals QRS ST segments and T  waves  Repeat EKG shows sinus rhythm rate of 54, no acute ischemic changes.  Normal axis and intervals.  ____________________________________________    RADIOLOGY  Dg Chest 1 View  Result Date: 06/12/2018 CLINICAL DATA:  57 year old female found down after fall. EXAM: CHEST  1 VIEW COMPARISON:  Chest CT 06/04/2018 and earlier. FINDINGS: Portable AP upright view at 1407 hours. Stable lung volumes and mediastinal contours are within normal limits. Allowing for portable technique the lungs are clear. Visualized tracheal air column is within normal limits. Negative visible bowel gas pattern. No acute osseous abnormality identified. IMPRESSION: No acute cardiopulmonary abnormality or acute traumatic injury identified. Electronically Signed   By: Genevie Ann M.D.   On: 06/12/2018 14:23   Ct Head Wo Contrast  Result Date: 06/12/2018 CLINICAL DATA:  Fall EXAM: CT HEAD WITHOUT CONTRAST TECHNIQUE: Contiguous axial images were obtained from the base of the skull through the vertex without intravenous contrast. COMPARISON:  CT 05/15/2018, MRI 03/12/2017 FINDINGS: Brain: No acute territorial infarction, hemorrhage or intracranial mass. Stable ventricle size. Minimal small vessel ischemic changes of the white matter. Vascular: No hyperdense vessels.  Carotid vascular calcification. Skull: Normal. Negative for fracture or focal lesion. Sinuses/Orbits: No acute finding. Other: None IMPRESSION: 1. No CT evidence for acute intracranial abnormality. 2. Minimal small vessel ischemic changes of the white matter. Electronically Signed   By: Donavan Foil M.D.   On: 06/12/2018 14:46    ____________________________________________   PROCEDURES Procedures  ____________________________________________  DIFFERENTIAL DIAGNOSIS   Intracranial hemorrhage, acute ischemic stroke, dehydration, electrolytic disturbance, urinary tract infection/delirium, pneumonia, non-STEMI  CLINICAL IMPRESSION / ASSESSMENT AND PLAN / ED  COURSE  Pertinent labs & imaging results that were available during my care of the patient were reviewed by me and considered in my medical decision making (see chart for details).    Patient presents with right-sided weakness with a history of TIA.  Caused her to fall this morning.  Concern for ischemic stroke.  CT head, chest x-ray, labs.  EKG nonischemic but with her chest pressure will trend troponins as well although is likely her baseline angina.      ____________________________________________   FINAL CLINICAL IMPRESSION(S) / ED DIAGNOSES    Final diagnoses:  Acute right-sided weakness  Unspecific chest pain   ED Discharge Orders    None      Portions of this note were generated with dragon dictation software. Dictation errors may occur despite best attempts at proofreading.    Carrie Mew, MD 06/12/18 1501

## 2018-06-13 ENCOUNTER — Telehealth: Payer: Self-pay | Admitting: Neurology

## 2018-06-13 ENCOUNTER — Observation Stay: Payer: Medicare Other

## 2018-06-13 DIAGNOSIS — I63233 Cerebral infarction due to unspecified occlusion or stenosis of bilateral carotid arteries: Secondary | ICD-10-CM | POA: Diagnosis not present

## 2018-06-13 DIAGNOSIS — I10 Essential (primary) hypertension: Secondary | ICD-10-CM | POA: Diagnosis not present

## 2018-06-13 DIAGNOSIS — R55 Syncope and collapse: Secondary | ICD-10-CM | POA: Diagnosis not present

## 2018-06-13 DIAGNOSIS — Z716 Tobacco abuse counseling: Secondary | ICD-10-CM | POA: Diagnosis not present

## 2018-06-13 DIAGNOSIS — S0990XA Unspecified injury of head, initial encounter: Secondary | ICD-10-CM | POA: Diagnosis not present

## 2018-06-13 DIAGNOSIS — E785 Hyperlipidemia, unspecified: Secondary | ICD-10-CM | POA: Diagnosis not present

## 2018-06-13 DIAGNOSIS — R531 Weakness: Secondary | ICD-10-CM | POA: Diagnosis not present

## 2018-06-13 LAB — LIPID PANEL
Cholesterol: 222 mg/dL — ABNORMAL HIGH (ref 0–200)
HDL: 45 mg/dL (ref >40)
LDL CALC: 151 mg/dL — AB (ref 0–99)
Total CHOL/HDL Ratio: 4.9 RATIO
Triglycerides: 128 mg/dL (ref <150)
VLDL: 26 mg/dL (ref 0–40)

## 2018-06-13 LAB — HEMOGLOBIN A1C
Hgb A1c MFr Bld: 4.9 % (ref 4.8–5.6)
Mean Plasma Glucose: 93.93 mg/dL

## 2018-06-13 LAB — ECHOCARDIOGRAM COMPLETE
Height: 62 in
Weight: 2287.99 oz

## 2018-06-13 MED ORDER — ATORVASTATIN CALCIUM 80 MG PO TABS: 80.0000 mg | ORAL_TABLET | Freq: Every day | ORAL | 3 refills | Status: DC

## 2018-06-13 MED ORDER — NICOTINE 21 MG/24HR TD PT24: 21.0000 mg | MEDICATED_PATCH | Freq: Every day | TRANSDERMAL | 0 refills | Status: DC

## 2018-06-13 NOTE — Telephone Encounter (Signed)
[facility] called and requested that this patient get a 1 week f/u with Dr. Jaynee Eagles. I explained that I would have to let Dr. Jaynee Eagles review this due to we do not have any thing in 1 week and that we can call pt once Dr. Jaynee Eagles Has reviewed it. Please advise?

## 2018-06-13 NOTE — Progress Notes (Signed)
PT Cancellation Note  Patient Details Name: Lindsey Bautista MRN: [id number] DOB: 1961-07-18   Cancelled Treatment:    Reason Eval/Treat Not Completed: PT screened, no needs identified, will sign off.  Met with pt who reports she has returned to her baseline.  She has been ambulating in her room without difficulty and without instability.  She is ind at baseline with all mobility.  No skilled PT needs identified, PT will sign off.  Collie Siad PT, DPT 06/13/2018, 11:23 AM

## 2018-06-13 NOTE — Discharge Summary (Signed)
Lindsey Bautista at [facility] NAME: Lindsey Bautista    MR#:  [id number]  DATE OF BIRTH:  [birth year]  DATE OF ADMISSION:  06/12/2018 ADMITTING PHYSICIAN: Lindsey Lesches, MD  DATE OF DISCHARGE: June 13, 2018  PRIMARY CARE PHYSICIAN: Lindsey Maple, MD    ADMISSION DIAGNOSIS:  Acute right-sided weakness [R53.1] Right sided weakness [R53.1]  DISCHARGE DIAGNOSIS:  Active Problems:   Right sided weakness   SECONDARY DIAGNOSIS:   Past Medical History:  Diagnosis Date  . Atypical chest pain    a. 08/2016 Neg MV.  [name] COPD (chronic obstructive pulmonary disease) ([place])   . Depression   . Diastolic dysfunction    a. 10/2016 Echo: EF 60-65%, no rwma, Gr1 DD, mild MR, nl LA size, nl RV fxn.  [name] GERD (gastroesophageal reflux disease)   . Hypotension   . IBS (irritable bowel syndrome)   . Right arm weakness   . Situational syncope   . TIA (transient ischemic attack)     HOSPITAL COURSE:  57 year old female with a history of COPD, TIA with chronic right-sided weakness and tobacco dependence who presented with right sided weakness.  1.  Right-sided weakness: This does not appear to be a neurological event.  Her symptoms quickly resolved.  Neurological work-up was negative for acute CVA.  She is at her baseline.  2. Tobacco dependence: Patient is encouraged to quit smoking. Counseling was provided for 4 minutes.  3.  Previous TIA: Continue aspirin and statin  4.  Fibromyalgia/anxiety: Patient will continue outpatient medications    DISCHARGE CONDITIONS AND DIET:   Stable for discharge on heart healthy diet  CONSULTS OBTAINED:    DRUG ALLERGIES:   Allergies  Allergen Reactions  . Penicillin G Anaphylaxis and Other (See Comments)    Has patient had a PCN reaction causing immediate rash, facial/tongue/throat swelling, SOB or lightheadedness with hypotension: [id number]} Has patient had a PCN reaction causing severe rash involving mucus  membranes or skin necrosis: no:[id number]} Has patient had a PCN reaction that required hospitalization no:[id number]} Has patient had a PCN reaction occurring within the last 10 years: [id number]} If all of the above answers are "NO", then may proceed with Cephalosporin use.   . Iodinated Diagnostic Agents Rash and Nausea And Vomiting    DISCHARGE MEDICATIONS:   Allergies as of 06/13/2018      Reactions   Penicillin G Anaphylaxis, Other (See Comments)   Has patient had a PCN reaction causing immediate rash, facial/tongue/throat swelling, SOB or lightheadedness with hypotension: [id number]} Has patient had a PCN reaction causing severe rash involving mucus membranes or skin necrosis: no:[id number]} Has patient had a PCN reaction that required hospitalization no:[id number]} Has patient had a PCN reaction occurring within the last 10 years: [id number]} If all of the above answers are "NO", then may proceed with Cephalosporin use.   Iodinated Diagnostic Agents Rash, Nausea And Vomiting      Medication List    STOP taking these medications   dicyclomine 20 MG tablet Commonly known as:  BENTYL   docusate sodium 100 MG capsule Commonly known as:  COLACE   nitroGLYCERIN 0.4 MG SL tablet Commonly known as:  NITROSTAT   pantoprazole 40 MG tablet Commonly known as:  PROTONIX   sucralfate 1 g tablet Commonly known as:  CARAFATE     TAKE these medications   aspirin 81 MG tablet Take 81 mg by mouth daily.   atorvastatin 80 MG tablet Commonly known as:  LIPITOR Take  1 tablet (80 mg total) by mouth daily.   clonazePAM 1 MG tablet Commonly known as:  KLONOPIN TAKE 1 TABLET BY MOUTH TWICE DAILY   estradiol 1 MG tablet Commonly known as:  ESTRACE TAKE 1 TABLET BY MOUTH ONCE DAILY   FLUoxetine 20 MG capsule Commonly known as:  PROZAC TAKE 1 CAPSULE BY MOUTH ONCE DAILY   nicotine 21 mg/24hr patch Commonly known as:  NICODERM CQ - dosed in mg/24 hours Place 1 patch (21 mg  total) onto the skin daily.         Today   CHIEF COMPLAINT:  Patient doing well this morning.   VITAL SIGNS:  Blood pressure 117/64, pulse 72, temperature 97.9 F (36.6 C), temperature source Oral, resp. rate 18, height 5\' 2"  (1.575 m), weight 64.9 kg, SpO2 97 %.   REVIEW OF SYSTEMS:  Review of Systems  Constitutional: Negative.  Negative for chills, fever and malaise/fatigue.  HENT: Negative.  Negative for ear discharge, ear pain, hearing loss, nosebleeds and sore throat.   Eyes: Negative.  Negative for blurred vision and pain.  Respiratory: Negative.  Negative for cough, hemoptysis, shortness of breath and wheezing.   Cardiovascular: Negative.  Negative for chest pain, palpitations and leg swelling.  Gastrointestinal: Negative.  Negative for abdominal pain, blood in stool, diarrhea, nausea and vomiting.  Genitourinary: Negative.  Negative for dysuria.  Musculoskeletal: Negative.  Negative for back pain.  Skin: Negative.   Neurological: Negative.  Negative for dizziness, tremors, speech change, focal weakness, seizures and headaches.       Chronic right-sided weakness  Endo/Heme/Allergies: Negative.  Does not bruise/bleed easily.  Psychiatric/Behavioral: Negative.  Negative for depression, hallucinations and suicidal ideas.     PHYSICAL EXAMINATION:  GENERAL:  57 y.o.-year-old patient lying in the bed with no acute distress.  NECK:  Supple, no jugular venous distention. No thyroid enlargement, no tenderness.  LUNGS: Normal breath sounds bilaterally, no wheezing, rales,rhonchi  No use of accessory muscles of respiration.  CARDIOVASCULAR: S1, S2 normal. No murmurs, rubs, or gallops.  ABDOMEN: Soft, non-tender, non-distended. Bowel sounds present. No organomegaly or mass.  EXTREMITIES: No pedal edema, cyanosis, or clubbing.  PSYCHIATRIC: The patient is alert and oriented x 3.  SKIN: No obvious rash, lesion, or ulcer.   DATA REVIEW:   CBC Recent Labs  Lab  06/12/18 1237  WBC 3.8*  HGB 14.4  HCT 43.4  PLT 284    Chemistries  Recent Labs  Lab 06/12/18 1237  NA 141  K 4.1  CL 106  CO2 30  GLUCOSE 82  BUN 10  CREATININE 0.85  CALCIUM 8.7*    Cardiac Enzymes Recent Labs  Lab 06/12/18 1237  TROPONINI <0.03    Microbiology Results  @MICRORSLT48 @  RADIOLOGY:  Dg Chest 1 View  Result Date: 06/12/2018 CLINICAL DATA:  57 year old female found down after fall. EXAM: CHEST  1 VIEW COMPARISON:  Chest CT 06/04/2018 and earlier. FINDINGS: Portable AP upright view at 1407 hours. Stable lung volumes and mediastinal contours are within normal limits. Allowing for portable technique the lungs are clear. Visualized tracheal air column is within normal limits. Negative visible bowel gas pattern. No acute osseous abnormality identified. IMPRESSION: No acute cardiopulmonary abnormality or acute traumatic injury identified. Electronically Signed   By: Genevie Ann M.D.   On: 06/12/2018 14:23   Ct Head Wo Contrast  Result Date: 06/12/2018 CLINICAL DATA:  Fall EXAM: CT HEAD WITHOUT CONTRAST TECHNIQUE: Contiguous axial images were obtained from the base of the  skull through the vertex without intravenous contrast. COMPARISON:  CT 05/15/2018, MRI 03/12/2017 FINDINGS: Brain: No acute territorial infarction, hemorrhage or intracranial mass. Stable ventricle size. Minimal small vessel ischemic changes of the white matter. Vascular: No hyperdense vessels.  Carotid vascular calcification. Skull: Normal. Negative for fracture or focal lesion. Sinuses/Orbits: No acute finding. Other: None IMPRESSION: 1. No CT evidence for acute intracranial abnormality. 2. Minimal small vessel ischemic changes of the white matter. Electronically Signed   By: Donavan Foil M.D.   On: 06/12/2018 14:46   Mr Brain Wo Contrast  Result Date: 06/13/2018 CLINICAL DATA:  [place] today, weakness. Found down. Headache and blurry vision since this morning. History of TIA. EXAM: MRI HEAD WITHOUT  CONTRAST TECHNIQUE: Multiplanar, multiecho pulse sequences of the brain and surrounding structures were obtained without intravenous contrast. COMPARISON:  CT HEAD June 12, 2018 and MRI of the head March 12, 2017. FINDINGS: INTRACRANIAL CONTENTS: No reduced diffusion to suggest acute ischemia or hyperacute demyelination. No susceptibility artifact to suggest hemorrhage. A few scattered subcentimeter supratentorial white matter FLAIR T2 hyperintensities are similar. The ventricles and sulci are normal for patient's age. No suspicious parenchymal signal, masses, mass effect. No abnormal extra-axial fluid collections. No extra-axial masses. VASCULAR: Normal major intracranial vascular flow voids present at skull base. SKULL AND UPPER CERVICAL SPINE: No abnormal sellar expansion. No suspicious calvarial bone marrow signal. Craniocervical junction maintained. SINUSES/ORBITS: The mastoid air-cells and included paranasal sinuses are well-aerated.The included ocular globes and orbital contents are non-suspicious. OTHER: None. IMPRESSION: Negative noncontrast MRI head. Electronically Signed   By: Elon Alas M.D.   On: 06/13/2018 01:14      Allergies as of 06/13/2018      Reactions   Penicillin G Anaphylaxis, Other (See Comments)   Has patient had a PCN reaction causing immediate rash, facial/tongue/throat swelling, SOB or lightheadedness with hypotension: [id number]} Has patient had a PCN reaction causing severe rash involving mucus membranes or skin necrosis: no:[id number]} Has patient had a PCN reaction that required hospitalization no:[id number]} Has patient had a PCN reaction occurring within the last 10 years: [id number]} If all of the above answers are "NO", then may proceed with Cephalosporin use.   Iodinated Diagnostic Agents Rash, Nausea And Vomiting      Medication List    STOP taking these medications   dicyclomine 20 MG tablet Commonly known as:  BENTYL   docusate sodium 100 MG  capsule Commonly known as:  COLACE   nitroGLYCERIN 0.4 MG SL tablet Commonly known as:  NITROSTAT   pantoprazole 40 MG tablet Commonly known as:  PROTONIX   sucralfate 1 g tablet Commonly known as:  CARAFATE     TAKE these medications   aspirin 81 MG tablet Take 81 mg by mouth daily.   atorvastatin 80 MG tablet Commonly known as:  LIPITOR Take 1 tablet (80 mg total) by mouth daily.   clonazePAM 1 MG tablet Commonly known as:  KLONOPIN TAKE 1 TABLET BY MOUTH TWICE DAILY   estradiol 1 MG tablet Commonly known as:  ESTRACE TAKE 1 TABLET BY MOUTH ONCE DAILY   FLUoxetine 20 MG capsule Commonly known as:  PROZAC TAKE 1 CAPSULE BY MOUTH ONCE DAILY   nicotine 21 mg/24hr patch Commonly known as:  NICODERM CQ - dosed in mg/24 hours Place 1 patch (21 mg total) onto the skin daily.          Management plans discussed with the patient and she is in agreement. Stable for discharge home  Patient  should follow up with pcp  CODE STATUS:     Code Status Orders  (From admission, onward)         Start     Ordered   06/12/18 1539  Full code  Continuous     06/12/18 1543        Code Status History    Date Active Date Inactive Code Status Order ID Comments User Context   11/17/2016 0106 11/17/2016 2020 Full Code [id number]  Harvie Bridge, DO Inpatient   12/13/2015 2213 12/15/2015 1626 Full Code [id number]  Ivor Costa, MD ED      TOTAL TIME TAKING CARE OF THIS PATIENT: 38 minutes.    Note: This dictation was prepared with Dragon dictation along with smaller phrase technology. Any transcriptional errors that result from this process are unintentional.  Gerry Blanchfield M.D on 06/13/2018 at 10:36 AM  Between 7am to 6pm - Pager - [phone] After 6pm go to [email or web address] - password [email or web address] [facility]  Office  [phone]  CC: Primary care physician; Lindsey Maple, MD

## 2018-06-13 NOTE — Progress Notes (Signed)
OT Cancellation Note  Patient Details Name: Lindsey Bautista MRN: [id number] DOB: 12/03/1960   Cancelled Treatment:    Reason Eval/Treat Not Completed: OT screened, no needs identified, will sign off.  Order received and chart reviewed.  Met with patient and no OT needs identified and order completed.  Please re-consult if any further needs arise.  Susan Wofford, OTR/L ascom [phone] 06/13/18, 11:14 AM     

## 2018-06-13 NOTE — Telephone Encounter (Signed)
Patient had acute right-sided weakness that resolved. I do not think she needs to be seen in a week. This may have been a TIA so I am going to cc Janett Billow to see if she could have a follow up with patient for TIA. Otherwise first available with Hoyle Sauer. thanks

## 2018-06-13 NOTE — Progress Notes (Signed)
Pt is being discharged home.  Discharge papers given and explained to pt.  Pt verbalized understanding. Meds and f/u appointments reviewed.  Rx to be picked up from pharmacy.  Pt made aware.  Awaiting transportation.

## 2018-06-13 NOTE — Progress Notes (Signed)
SLP Cancellation Note  Patient Details Name: MISHAEL KRYSIAK MRN: [id number] DOB: 1961/07/07   Cancelled treatment:       Reason Eval/Treat Not Completed: SLP screened, no needs identified, will sign off(chart reviewed; consulted NSG then met w/ pt). Pt denied any difficulty swallowing and is currently on a regular diet; tolerates swallowing pills w/ water per NSG. Pt conversed at conversational level w/out deficits noted; pt denied any speech-language deficits.  No further skilled ST services indicated as pt appears at her baseline. Pt agreed. NSG to reconsult if any change in status.     Orinda Kenner, MS, CCC-SLP Watson,Katherine 06/13/2018, 12:44 PM

## 2018-06-14 ENCOUNTER — Telehealth: Payer: Self-pay

## 2018-06-14 LAB — URINE CULTURE: Culture: NO GROWTH

## 2018-06-14 LAB — HIV ANTIBODY (ROUTINE TESTING W REFLEX): HIV Screen 4th Generation wRfx: NONREACTIVE

## 2018-06-14 NOTE — Telephone Encounter (Signed)
Dr. Jaynee Eagles, After reviewing recent hospital admission, this is what they wrote:  57 year old female with a history of COPD, TIA with chronic right-sided weakness and tobacco dependence who presented with right sided weakness.  1.  Right-sided weakness: This does not appear to be a neurological event.  Her symptoms quickly resolved.  Neurological work-up was negative for acute CVA.  She is at her baseline.  2. Tobacco dependence: Patient is encouraged to quit smoking. Counseling was provided for 4 minutes.  3.  Previous TIA: Continue aspirin and statin  4.  Fibromyalgia/anxiety: Patient will continue outpatient medications   It doesn't appear that they were considering this to be a TIA. Looks like she comes into the ED a lot with this right sided weakness complaint. As she was not seen by you, Dr. Erlinda Hong or Dr. Leonie Man in the hospital and has not been seen here since 2016, from what I have been told I am unable to see her.   -Jess

## 2018-06-14 NOTE — Telephone Encounter (Signed)
Pt reschedule with Dr. Leonie Man on 07/25/2018. None of the scans show a stroke per Dr.Ahern. Pt will be seeing Dr. Leonie Man for the new pt appt.

## 2018-06-14 NOTE — Telephone Encounter (Signed)
Transition Care Management Follow-up Telephone Call  Date of discharge and from where: 06/13/2018 from [facility]  How have you been since you were released from the hospital? "Im still pretty weak from being in the hospital"  Any questions or concerns? Yes  wanted to make sure I got a sooner appt with the neurologist. Informed her someone from that office would contact her in regards to an appt. She is requesting MRI results again from hospital admission compared to her last MRI. Informed her this is something Dr.Ahern or Dr.Crissman would review with her at her appt.   Items Reviewed:  Did the pt receive and understand the discharge instructions provided? No  states she has not read the discharge summary yet  Medications obtained and verified? Yes   Any new allergies since your discharge? No   Dietary orders reviewed? Yes  Do you have support at home? Yes   Functional Questionnaire: (I = Independent and D = Dependent) ADLs:   Bathing/Dressing- I  Meal Prep- I  Eating- I  Maintaining continence- I  Transferring/Ambulation- I  Managing Meds- I  Follow up appointments reviewed:   PCP Hospital f/u appt confirmed? Yes  cancelled hospital follow up with Merrie Roof on 06/17/2018 but wanted to keep her original follow up with Dr. Jeananne Rama on 06/24/2018 at 1:45pm   [facility] f/u appt confirmed? Yes  Scheduled to see Dr.Ahern in December but was requested to follow up in one week by hospital. No new appt made yet. Patient understand that office will call her with appt.    Are transportation arrangements needed? No   If their condition worsens, is the pt aware to call PCP or go to the Emergency Dept.? Yes  Was the patient provided with contact information for the PCP's office or ED? Yes  Was to pt encouraged to call back with questions or concerns? Yes

## 2018-06-14 NOTE — Telephone Encounter (Signed)
Lindsey Bautista, she has not been seen since 2016 which makes her a new patient. See [name]'s note, I completely agree. Since she is a new patient she can see any physician not just myself. She does not need to be seen in a week, this is not urgent and consider it like any other new referral. She can be placed with the other new referrals and given an appointment when available with any [facility] provider thanks.

## 2018-06-17 ENCOUNTER — Observation Stay
Admission: EM | Admit: 2018-06-17 | Discharge: 2018-06-19 | Disposition: A | Discharge status: Home / Self Care | Payer: Medicare Other | Attending: Internal Medicine | Admitting: Internal Medicine

## 2018-06-17 ENCOUNTER — Ambulatory Visit (INDEPENDENT_AMBULATORY_CARE_PROVIDER_SITE_OTHER): Payer: Medicare Other | Admitting: Physician Assistant

## 2018-06-17 ENCOUNTER — Encounter: Payer: Self-pay | Admitting: Physician Assistant

## 2018-06-17 ENCOUNTER — Other Ambulatory Visit: Payer: Self-pay

## 2018-06-17 ENCOUNTER — Telehealth: Payer: Self-pay | Admitting: Cardiovascular Disease

## 2018-06-17 ENCOUNTER — Emergency Department: Payer: Medicare Other

## 2018-06-17 ENCOUNTER — Inpatient Hospital Stay: Payer: Medicare Other | Admitting: Family Medicine

## 2018-06-17 VITALS — BP 130/64 | HR 57 | Ht 61.0 in | Wt 140.0 lb

## 2018-06-17 DIAGNOSIS — E785 Hyperlipidemia, unspecified: Secondary | ICD-10-CM | POA: Insufficient documentation

## 2018-06-17 DIAGNOSIS — F419 Anxiety disorder, unspecified: Secondary | ICD-10-CM | POA: Diagnosis not present

## 2018-06-17 DIAGNOSIS — Z8673 Personal history of transient ischemic attack (TIA), and cerebral infarction without residual deficits: Secondary | ICD-10-CM | POA: Insufficient documentation

## 2018-06-17 DIAGNOSIS — I700 Atherosclerosis of aorta: Secondary | ICD-10-CM | POA: Diagnosis not present

## 2018-06-17 DIAGNOSIS — R0600 Dyspnea, unspecified: Secondary | ICD-10-CM | POA: Diagnosis not present

## 2018-06-17 DIAGNOSIS — Z880 Allergy status to penicillin: Secondary | ICD-10-CM | POA: Diagnosis not present

## 2018-06-17 DIAGNOSIS — I110 Hypertensive heart disease with heart failure: Secondary | ICD-10-CM | POA: Diagnosis not present

## 2018-06-17 DIAGNOSIS — R531 Weakness: Secondary | ICD-10-CM

## 2018-06-17 DIAGNOSIS — Z7982 Long term (current) use of aspirin: Secondary | ICD-10-CM | POA: Insufficient documentation

## 2018-06-17 DIAGNOSIS — E782 Mixed hyperlipidemia: Secondary | ICD-10-CM

## 2018-06-17 DIAGNOSIS — I959 Hypotension, unspecified: Secondary | ICD-10-CM | POA: Insufficient documentation

## 2018-06-17 DIAGNOSIS — J449 Chronic obstructive pulmonary disease, unspecified: Secondary | ICD-10-CM | POA: Diagnosis not present

## 2018-06-17 DIAGNOSIS — Z91041 Radiographic dye allergy status: Secondary | ICD-10-CM | POA: Insufficient documentation

## 2018-06-17 DIAGNOSIS — F329 Major depressive disorder, single episode, unspecified: Secondary | ICD-10-CM | POA: Insufficient documentation

## 2018-06-17 DIAGNOSIS — I5032 Chronic diastolic (congestive) heart failure: Secondary | ICD-10-CM | POA: Insufficient documentation

## 2018-06-17 DIAGNOSIS — M797 Fibromyalgia: Secondary | ICD-10-CM | POA: Diagnosis not present

## 2018-06-17 DIAGNOSIS — I25110 Atherosclerotic heart disease of native coronary artery with unstable angina pectoris: Principal | ICD-10-CM | POA: Insufficient documentation

## 2018-06-17 DIAGNOSIS — K589 Irritable bowel syndrome without diarrhea: Secondary | ICD-10-CM | POA: Insufficient documentation

## 2018-06-17 DIAGNOSIS — K219 Gastro-esophageal reflux disease without esophagitis: Secondary | ICD-10-CM | POA: Insufficient documentation

## 2018-06-17 DIAGNOSIS — Z720 Tobacco use: Secondary | ICD-10-CM | POA: Diagnosis not present

## 2018-06-17 DIAGNOSIS — F17210 Nicotine dependence, cigarettes, uncomplicated: Secondary | ICD-10-CM | POA: Diagnosis not present

## 2018-06-17 DIAGNOSIS — I200 Unstable angina: Secondary | ICD-10-CM

## 2018-06-17 DIAGNOSIS — Z8249 Family history of ischemic heart disease and other diseases of the circulatory system: Secondary | ICD-10-CM | POA: Diagnosis not present

## 2018-06-17 DIAGNOSIS — N644 Mastodynia: Secondary | ICD-10-CM | POA: Diagnosis present

## 2018-06-17 DIAGNOSIS — R079 Chest pain, unspecified: Secondary | ICD-10-CM

## 2018-06-17 DIAGNOSIS — F449 Dissociative and conversion disorder, unspecified: Secondary | ICD-10-CM | POA: Insufficient documentation

## 2018-06-17 DIAGNOSIS — R0609 Other forms of dyspnea: Secondary | ICD-10-CM

## 2018-06-17 LAB — BASIC METABOLIC PANEL
ANION GAP: 9 (ref 5–15)
BUN: 9 mg/dL (ref 6–20)
CHLORIDE: 102 mmol/L (ref 98–111)
CO2: 28 mmol/L (ref 22–32)
Calcium: 9.2 mg/dL (ref 8.9–10.3)
Creatinine, Ser: 0.84 mg/dL (ref 0.44–1.00)
GFR calc Af Amer: >60 mL/min (ref >60)
GFR calc non Af Amer: >60 mL/min (ref >60)
GLUCOSE: 139 mg/dL — AB (ref 70–99)
POTASSIUM: 3.5 mmol/L (ref 3.5–5.1)
SODIUM: 139 mmol/L (ref 135–145)

## 2018-06-17 LAB — CBC
HCT: 42.8 % (ref 36.0–46.0)
HEMOGLOBIN: 14.5 g/dL (ref 12.0–15.0)
MCH: 30.1 pg (ref 26.0–34.0)
MCHC: 33.9 g/dL (ref 30.0–36.0)
MCV: 88.8 fL (ref 80.0–100.0)
Platelets: 294 10*3/uL (ref 150–400)
RBC: 4.82 MIL/uL (ref 3.87–5.11)
RDW: 12.8 % (ref 11.5–15.5)
WBC: 5.8 10*3/uL (ref 4.0–10.5)
nRBC: 0 % (ref 0.0–0.2)

## 2018-06-17 LAB — POCT PREGNANCY, URINE: Preg Test, Ur: NEGATIVE

## 2018-06-17 LAB — TROPONIN I
Troponin I: <0.03 ng/mL (ref <0.03)
Troponin I: <0.03 ng/mL (ref <0.03)

## 2018-06-17 MED ORDER — POLYETHYLENE GLYCOL 3350 17 G PO PACK: 17.0000 g | PACK | Freq: Every day | ORAL | Status: DC | PRN

## 2018-06-17 MED ORDER — ALBUTEROL SULFATE (2.5 MG/3ML) 0.083% IN NEBU: 2.5000 mg | INHALATION_SOLUTION | RESPIRATORY_TRACT | Status: DC | PRN

## 2018-06-17 MED ORDER — ACETAMINOPHEN 650 MG RE SUPP: 650.0000 mg | Freq: Four times a day (QID) | RECTAL | Status: DC | PRN

## 2018-06-17 MED ORDER — ASPIRIN 81 MG PO TABS: 81.0000 mg | ORAL_TABLET | Freq: Every day | ORAL | Status: DC

## 2018-06-17 MED ORDER — ONDANSETRON HCL 4 MG/2ML IJ SOLN: 4.0000 mg | Freq: Four times a day (QID) | INTRAMUSCULAR | Status: DC | PRN

## 2018-06-17 MED ORDER — ESTRADIOL 1 MG PO TABS
1.0000 mg | ORAL_TABLET | Freq: Every day | ORAL | Status: DC
  Administered 2018-06-18 – 2018-06-19 (×2): 1 mg via ORAL
  Filled 2018-06-17 (×2): qty 1

## 2018-06-17 MED ORDER — ASPIRIN EC 81 MG PO TBEC
81.0000 mg | DELAYED_RELEASE_TABLET | Freq: Every day | ORAL | Status: DC
  Administered 2018-06-18: 81 mg via ORAL
  Filled 2018-06-17 (×2): qty 1

## 2018-06-17 MED ORDER — ATORVASTATIN CALCIUM 20 MG PO TABS
80.0000 mg | ORAL_TABLET | Freq: Every day | ORAL | Status: DC
  Administered 2018-06-18 – 2018-06-19 (×2): 80 mg via ORAL
  Filled 2018-06-17 (×2): qty 4

## 2018-06-17 MED ORDER — CLONAZEPAM 1 MG PO TABS
1.0000 mg | ORAL_TABLET | Freq: Two times a day (BID) | ORAL | Status: DC
  Administered 2018-06-18 – 2018-06-19 (×4): 1 mg via ORAL
  Filled 2018-06-17: qty 2
  Filled 2018-06-17 (×2): qty 1
  Filled 2018-06-17: qty 2

## 2018-06-17 MED ORDER — ENOXAPARIN SODIUM 40 MG/0.4ML ~~LOC~~ SOLN
40.0000 mg | SUBCUTANEOUS | Status: DC
  Administered 2018-06-18 (×2): 40 mg via SUBCUTANEOUS
  Filled 2018-06-17 (×2): qty 0.4

## 2018-06-17 MED ORDER — ASPIRIN 81 MG PO CHEW
324.0000 mg | CHEWABLE_TABLET | Freq: Once | ORAL | Status: AC
  Administered 2018-06-17: 324 mg via ORAL
  Filled 2018-06-17: qty 4

## 2018-06-17 MED ORDER — FLUOXETINE HCL 20 MG PO CAPS
20.0000 mg | ORAL_CAPSULE | Freq: Every day | ORAL | Status: DC
  Administered 2018-06-18 – 2018-06-19 (×2): 20 mg via ORAL
  Filled 2018-06-17 (×2): qty 1

## 2018-06-17 MED ORDER — ACETAMINOPHEN 325 MG PO TABS
650.0000 mg | ORAL_TABLET | Freq: Four times a day (QID) | ORAL | Status: DC | PRN
  Administered 2018-06-18: 650 mg via ORAL
  Filled 2018-06-17: qty 2

## 2018-06-17 MED ORDER — SODIUM CHLORIDE 0.9% FLUSH
3.0000 mL | Freq: Two times a day (BID) | INTRAVENOUS | Status: DC
  Administered 2018-06-17 – 2018-06-19 (×4): 3 mL via INTRAVENOUS

## 2018-06-17 MED ORDER — ONDANSETRON HCL 4 MG PO TABS: 4.0000 mg | ORAL_TABLET | Freq: Four times a day (QID) | ORAL | Status: DC | PRN

## 2018-06-17 NOTE — ED Notes (Signed)
Report given to Raquel RN

## 2018-06-17 NOTE — H&P (Signed)
[place] [facility] [place] NAME: Lindsey Bautista    MR#:  [id number]  DATE OF BIRTH:  [birth year]  DATE OF ADMISSION:  06/17/2018  PRIMARY CARE PHYSICIAN: [name], MD   REQUESTING/REFERRING PHYSICIAN: Dr. Alfred Levins  CHIEF COMPLAINT:   Chief Complaint  Patient presents with  . Chest Pain    HISTORY OF PRESENT ILLNESS:  Lindsey Bautista  is a 57 y.o. female with a known history of CVA, anxiety, tobacco use presents to the emergency room sent in from her cardiologist's office due to 4 hours of chest pressure radiating to left jaw and left arm.  Patient mentions that her symptoms started in 2012 and has been having similar pain for a brief while every week.  Today it got acutely worse and was seen in cardiology office.  Lasted for hours.  Her troponin and EKG are normal and unchanged.  Patient is being admitted to rule out acute coronary syndrome.  Patient was recently in the hospital for right-sided weakness with MRI negative.  Continues to have persistent right-sided weakness.  PAST MEDICAL HISTORY:   Past Medical History:  Diagnosis Date  . Atypical chest pain    a. 08/2016 Neg MV.  [name] COPD (chronic obstructive pulmonary disease) ([place])   . Depression   . Diastolic dysfunction    a. 10/2016 Echo: EF 60-65%, no rwma, Gr1 DD, mild MR, nl LA size, nl RV fxn.  [name] GERD (gastroesophageal reflux disease)   . Hypotension   . IBS (irritable bowel syndrome)   . Right arm weakness   . Situational syncope   . TIA (transient ischemic attack)     PAST SURGICAL HISTORY:   Past Surgical History:  Procedure Laterality Date  . COLONOSCOPY WITH PROPOFOL N/A 11/22/2015   Procedure: COLONOSCOPY WITH PROPOFOL;  Surgeon: Manya Silvas, MD;  Location: [facility] ENDOSCOPY;  Service: Endoscopy;  Laterality: N/A;  . ESOPHAGOGASTRODUODENOSCOPY (EGD) WITH PROPOFOL N/A 11/22/2015   Procedure: ESOPHAGOGASTRODUODENOSCOPY (EGD) WITH PROPOFOL;  Surgeon: Manya Silvas, MD;   Location: [facility] ENDOSCOPY;  Service: Endoscopy;  Laterality: N/A;  . OOPHORECTOMY    . TONSILLECTOMY    . VAGINAL HYSTERECTOMY      SOCIAL HISTORY:   Social History   Tobacco Use  . Smoking status: Current Every Day Smoker    Packs/day: 1.00    Years: 39.00    Pack years: 39.00    Types: Cigarettes  . Smokeless tobacco: Never Used  Substance Use Topics  . Alcohol use: No    Alcohol/week: 0.0 standard drinks    FAMILY HISTORY:   Family History  Problem Relation Age of Onset  . Bladder Cancer Mother   . Heart disease Unknown   . COPD Unknown   . Stroke Unknown   . Breast cancer Paternal Aunt 10  . Colon cancer Brother     DRUG ALLERGIES:   Allergies  Allergen Reactions  . Penicillin G Anaphylaxis and Other (See Comments)    Has patient had a PCN reaction causing immediate rash, facial/tongue/throat swelling, SOB or lightheadedness with hypotension: [id number]} Has patient had a PCN reaction causing severe rash involving mucus membranes or skin necrosis: no:[id number]} Has patient had a PCN reaction that required hospitalization no:[id number]} Has patient had a PCN reaction occurring within the last 10 years: [id number]} If all of the above answers are "NO", then may proceed with Cephalosporin use.   . Iodinated Diagnostic Agents Rash and Nausea And Vomiting    REVIEW OF  SYSTEMS:   Review of Systems  Constitutional: Positive for malaise/fatigue. Negative for chills and fever.  HENT: Negative for sore throat.   Eyes: Negative for blurred vision, double vision and pain.  Respiratory: Positive for shortness of breath. Negative for cough, hemoptysis and wheezing.   Cardiovascular: Positive for chest pain. Negative for palpitations, orthopnea and leg swelling.  Gastrointestinal: Negative for abdominal pain, constipation, diarrhea, heartburn, nausea and vomiting.  Genitourinary: Negative for dysuria and hematuria.  Musculoskeletal: Negative for back pain and joint  pain.  Skin: Negative for rash.  Neurological: Negative for sensory change, speech change, focal weakness and headaches.  Endo/Heme/Allergies: Does not bruise/bleed easily.  Psychiatric/Behavioral: Negative for depression. The patient is not nervous/anxious.    MEDICATIONS AT HOME:   Prior to Admission medications   Medication Sig Start Date End Date Taking? Authorizing Provider  aspirin 81 MG tablet Take 81 mg by mouth daily.   Yes [provider]  clonazePAM (KLONOPIN) 1 MG tablet TAKE 1 TABLET BY MOUTH TWICE DAILY 02/20/18  Yes Crissman, Jeannette How, MD  estradiol (ESTRACE) 1 MG tablet TAKE 1 TABLET BY MOUTH ONCE DAILY 05/20/18  Yes Crissman, Jeannette How, MD  FLUoxetine (PROZAC) 20 MG capsule TAKE 1 CAPSULE BY MOUTH ONCE DAILY 05/22/18  Yes Crissman, Jeannette How, MD  atorvastatin (LIPITOR) 80 MG tablet Take 1 tablet (80 mg total) by mouth daily. Patient not taking: Reported on 06/17/2018 06/13/18   Bettey Costa, MD     VITAL SIGNS:  Blood pressure 130/72, pulse (!) 58, temperature 98 F (36.7 C), resp. rate 10, height 5\' 1"  (1.549 m), weight 64.9 kg, SpO2 100 %.  PHYSICAL EXAMINATION:  Physical Exam  GENERAL:  57 y.o.-year-old patient lying in the bed with no acute distress.  EYES: Pupils equal, round, reactive to light and accommodation. No scleral icterus. Extraocular muscles intact.  HEENT: Head atraumatic, normocephalic. Oropharynx and nasopharynx clear. No oropharyngeal erythema, moist oral mucosa  NECK:  Supple, no jugular venous distention. No thyroid enlargement, no tenderness.  LUNGS: Normal breath sounds bilaterally, no wheezing, rales, rhonchi. No use of accessory muscles of respiration.  Some left chest wall tenderness CARDIOVASCULAR: S1, S2 normal. No murmurs, rubs, or gallops.  ABDOMEN: Soft, nontender, nondistended. Bowel sounds present. No organomegaly or mass.  EXTREMITIES: No pedal edema, cyanosis, or clubbing. + 2 pedal & radial pulses b/l.   NEUROLOGIC: Cranial nerves II  through XII are intact.  Right motor strength 4/5 and left 5/5. PSYCHIATRIC: The patient is alert and oriented x 3. Good affect.  SKIN: No obvious rash, lesion, or ulcer.   LABORATORY PANEL:   CBC Recent Labs  Lab 06/17/18 1424  WBC 5.8  HGB 14.5  HCT 42.8  PLT 294   ------------------------------------------------------------------------------------------------------------------  Chemistries  Recent Labs  Lab 06/17/18 1533  NA 139  K 3.5  CL 102  CO2 28  GLUCOSE 139*  BUN 9  CREATININE 0.84  CALCIUM 9.2   ------------------------------------------------------------------------------------------------------------------  Cardiac Enzymes Recent Labs  Lab 06/17/18 1533  TROPONINI <0.03   ------------------------------------------------------------------------------------------------------------------  RADIOLOGY:  Dg Chest 2 View  Result Date: 06/17/2018 CLINICAL DATA:  Chest pain. EXAM: CHEST - 2 VIEW COMPARISON:  Radiograph of June 12, 2018 FINDINGS: The heart size and mediastinal contours are within normal limits. Both lungs are clear. No pneumothorax or pleural effusion is noted. The visualized skeletal structures are unremarkable. IMPRESSION: No active cardiopulmonary disease. Electronically Signed   By: Marijo Conception, M.D.   On: 06/17/2018 14:55  IMPRESSION AND PLAN:   *Chronic recurrent chest pain.  Ongoing for many years.  But has worsened acutely today lasting for hours.  Etiology unclear but patient has had stress test in the past for similar problem.  Today her troponin and EKG are normal.  Will consult cardiology after admitting patient telemetry floor and repeat troponin.  N.p.o. after midnight.  Stress test versus cardiac catheterization per cardiology.  Sent from [facility] office who I have consulted.  *History of CVA on aspirin.  Will add atorvastatin.  Patient will need prescription at discharge for this.  *Tobacco abuse.   Counseled to quit greater than 3 minutes.  *DVT prophylaxis with Lovenox  All the records are reviewed and case discussed with ED provider. Management plans discussed with the patient, family and they are in agreement.  CODE STATUS: FULL CODE  TOTAL TIME TAKING CARE OF THIS PATIENT: 45 minutes.   Leia Alf Siara Gorder M.D on 06/17/2018 at 8:57 PM  Between 7am to 6pm - Pager - [phone]  After 6pm go to [email or web address] - password [email or web address] [facility]  Office  [phone]  CC: Primary care physician; [name], MD  Note: This dictation was prepared with Dragon dictation along with smaller phrase technology. Any transcriptional errors that result from this process are unintentional.

## 2018-06-17 NOTE — ED Notes (Signed)
Report given to Lisa RN

## 2018-06-17 NOTE — Telephone Encounter (Signed)
Patient called in with complaints of shortness of breath when sitting and when ambulating. She stated that this occurs once every couple of weeks. She denies edema and chest pain. The patient was audibly short of breath while on the phone. She has an appointment today at 1:30. She has been advised to go to the ED for an assessment since she was audibly short of breath. She refused to go to the ED since "this occurs every couple of weeks"  She was recently discharged from the hospital on 10/24 with acute right sided weakness. She was once again advised to go to the ED but has refused. She did say if it gets worse then she will go but would like to come to her appointment today at 1:30.

## 2018-06-17 NOTE — Patient Instructions (Signed)
To ER for chest pain.

## 2018-06-17 NOTE — Telephone Encounter (Signed)
Noted! Thank you

## 2018-06-17 NOTE — Progress Notes (Signed)
Cardiology Office Note Date:  06/17/2018  Patient ID:  Lindsey Bautista, Lindsey Bautista 11-Jul-1961, MRN [id number] PCP:  [name], MD  Cardiologist:  Dr. Rockey Situ, MD    Chief Complaint: SOB  History of Present Illness: Lindsey Bautista is a 57 y.o. female with history of atypical chest pain, chronic dyspnea on exertion with ongoing tobacco abuse at at least 1 pack daily, previously 2 packs daily since she was 57 years old, stroke symptoms in 2015, situational syncope, depression, anxiety, conversion disorder, HLD, and noncompliance who presents for evaluation of SOB.   Prior echo in the setting of stroke-like symptoms in 2015 showed an EF of 55-60%, normal wall motion, normal LV diastolic function, trace TR. Follow up echo in 10/2016 in the setting of possible TIA showed an EF of 60-65%, no RWMA, Gr1DD, mild MR, LA normal in size, RVSF normal, PASP normal. Patient underwent a Myoview in 08/2016 for constant, atypical chest pain that was negative for significant ischemia. She was seen most recently in our office in 06/2017 for ED follow up of constant, atypical chest pain with reassurance provided.  Prior lung cancer screening chest CT on 06/04/2018 demonstrated aortic calcification as well as coronary artery calcifications.  Patient was recently admitted to [facility] from 10/23-10/24 for right-sided weakness. Head CT was not acute. MRI of the brain was not acute. Carotid artery ultrasound showed < 65% LICA stenosis with minimal RICA stenosis. Echo on 06/13/2018 showed an EF of 60-65%, no RWMA, normal LV diastolic function, mild MR, RVSF normal, no ASD or PFO with contrast showing no atrial level shunt at baseline or with provocation, PASP normal. Patient was back at her baseline quickly. Etiology not noted in discharge summary.   She called our office this morning noting severe shortness of breath and substernal chest pain that radiated to the left side of her neck, left jaw, and left shoulder.  This chest  pressure was noted to be 8 out of 10 and associated with shortness of breath, diaphoresis, and nausea.  Pressure woke her up from her sleep and has been persistent since.  She has taken sublingual nitroglycerin x1 with improvement in the chest pressure to 6 out of 10 currently.  Chest pressure and shortness of breath is much more severe when compared to her prior episodes.  She also continues to note significant right sided weakness.  She is accompanied by her husband in the office today.   Past Medical History:  Diagnosis Date  . Atypical chest pain    a. 08/2016 Neg MV.  [name] COPD (chronic obstructive pulmonary disease) ([place])   . Depression   . Diastolic dysfunction    a. 10/2016 Echo: EF 60-65%, no rwma, Gr1 DD, mild MR, nl LA size, nl RV fxn.  [name] GERD (gastroesophageal reflux disease)   . Hypotension   . IBS (irritable bowel syndrome)   . Right arm weakness   . Situational syncope   . TIA (transient ischemic attack)     Past Surgical History:  Procedure Laterality Date  . COLONOSCOPY WITH PROPOFOL N/A 11/22/2015   Procedure: COLONOSCOPY WITH PROPOFOL;  Surgeon: Manya Silvas, MD;  Location[facility] ENDOSCOPY;  Service: Endoscopy;  Laterality: N/A;  . ESOPHAGOGASTRODUODENOSCOPY (EGD) WITH PROPOFOL N/A 11/22/2015   Procedure: ESOPHAGOGASTRODUODENOSCOPY (EGD) WITH PROPOFOL;  Surgeon: Manya Silvas, MD;  Location: [facility] ENDOSCOPY;  Service: Endoscopy;  Laterality: N/A;  . OOPHORECTOMY    . TONSILLECTOMY    . VAGINAL HYSTERECTOMY  Current Meds  Medication Sig  . aspirin 81 MG tablet Take 81 mg by mouth daily.  [name] atorvastatin (LIPITOR) 80 MG tablet Take 1 tablet (80 mg total) by mouth daily.  . clonazePAM (KLONOPIN) 1 MG tablet TAKE 1 TABLET BY MOUTH TWICE DAILY  . estradiol (ESTRACE) 1 MG tablet TAKE 1 TABLET BY MOUTH ONCE DAILY  . FLUoxetine (PROZAC) 20 MG capsule TAKE 1 CAPSULE BY MOUTH ONCE DAILY    Allergies:   Penicillin g and Iodinated diagnostic agents   Social History:   The patient  reports that she has been smoking cigarettes. She has a 39.00 pack-year smoking history. She has never used smokeless tobacco. She reports that she does not drink alcohol or use drugs.   Family History:  The patient's family history includes Bladder Cancer in her mother; Breast cancer (age of onset: 45) in her paternal aunt; COPD in her unknown relative; Colon cancer in her brother; Heart disease in her unknown relative; Stroke in her unknown relative.  ROS:   Review of Systems  Constitutional: Positive for diaphoresis and malaise/fatigue. Negative for chills, fever and weight loss.  HENT: Negative for congestion.   Eyes: Negative for discharge and redness.  Respiratory: Positive for shortness of breath. Negative for cough, hemoptysis, sputum production and wheezing.   Cardiovascular: Positive for chest pain and palpitations. Negative for orthopnea, claudication, leg swelling and PND.  Gastrointestinal: Positive for nausea. Negative for abdominal pain, blood in stool, constipation, diarrhea, heartburn, melena and vomiting.  Genitourinary: Negative for hematuria.  Musculoskeletal: Positive for joint pain, myalgias and neck pain. Negative for falls.  Skin: Negative for rash.  Neurological: Positive for dizziness, tingling, sensory change, speech change, focal weakness and weakness. Negative for tremors and loss of consciousness.  Endo/Heme/Allergies: Does not bruise/bleed easily.  Psychiatric/Behavioral: Negative for substance abuse. The patient is nervous/anxious.   All other systems reviewed and are negative.    PHYSICAL EXAM:  VS:  BP 130/64 (BP Location: Right Arm, Patient Position: Sitting, Cuff Size: Normal)   Pulse (!) 57   Ht 5\' 1"  (1.549 m)   Wt 140 lb (63.5 kg)   SpO2 99%   BMI 26.45 kg/m  BMI: Body mass index is 26.45 kg/m.  Physical Exam  Constitutional: She is oriented to person, place, and time. She appears well-developed and well-nourished.  Tearful  throughout entire visit  HENT:  Head: Normocephalic and atraumatic.  Eyes: Right eye exhibits no discharge. Left eye exhibits no discharge.  Neck: Normal range of motion. No JVD present.  Cardiovascular: Normal rate, regular rhythm, S1 normal, S2 normal and normal heart sounds. Exam reveals no distant heart sounds, no friction rub, no midsystolic click and no opening snap.  No murmur heard. Pulses:      Posterior tibial pulses are 2+ on the right side, and 2+ on the left side.  Pulmonary/Chest: Effort normal and breath sounds normal. No respiratory distress. She has no decreased breath sounds. She has no wheezes. She has no rales. She exhibits no tenderness.  Abdominal: Soft. She exhibits no distension. There is no tenderness.  Musculoskeletal: She exhibits no edema.  Neurological: She is alert and oriented to person, place, and time.  Unsteady gait requiring husband and myself to maneuver up onto exam table and off off exam table.  Decreased sensation to light touch along the right side of the face.  Patient has 5 out of 5 upper and lower extremity strength on the left side with 4 out of 5 upper  and lower extremity strength on the right side.  Skin: Skin is warm and dry. No cyanosis. Nails show no clubbing.  Psychiatric: She has a normal mood and affect. Her speech is normal and behavior is normal. Judgment and thought content normal.     EKG:  Was ordered and interpreted by me today. Shows sinus bradycardia, 57 bpm, low voltage QRS, nonspecific ST-T changes  Recent Labs: 03/11/2018: ALT 12 06/12/2018: BUN 10; Creatinine, Ser 0.85; Hemoglobin 14.4; Platelets 284; Potassium 4.1; Sodium 141  06/13/2018: Cholesterol 222; HDL 45; LDL Cholesterol 151; Total CHOL/HDL Ratio 4.9; Triglycerides 128; VLDL 26   Estimated Creatinine Clearance: 62.4 mL/min (by C-G formula based on SCr of 0.85 mg/dL).   Wt Readings from Last 3 Encounters:  06/17/18 140 lb (63.5 kg)  06/12/18 143 lb (64.9 kg)    06/13/18 143 lb (64.9 kg)     Other studies reviewed: Additional studies/records reviewed today include: summarized above  ASSESSMENT AND PLAN:  1. Coronary artery calcification with symptoms concerning for unstable angina: Continues to note chest pressure with symptoms concerning for unstable angina.  Discussed with the patient direct admission versus ED evaluation.  Because of her ongoing symptoms our concerns are that a direct admission would lead to a delay in her evaluation and treatment.  Because of this, we have recommended she proceed to the ED for urgent evaluation and treatment followed by likely admission.  We will need to see how her troponin and EKG trend is as well as her symptoms following medical therapy.  She will need to be n.p.o. at midnight for possible stress test versus cardiac catheterization on 10/29.  Significant risk factors include ongoing tobacco abuse, hyperlipidemia, hypertension, and known coronary artery calcifications.  2. Right-sided weakness: Inpatient work up was unrevealing.  Patient reports she was never back to her baseline.  She continues to have mild right upper extremity and lower extremity weakness as well as decreased sensation to light touch on the right side.  Has a history of conversion disorder. Recommend she follow up with PCP as directed.   3. Chronic dyspnea: Much worse at this time.  Recommend further evaluation as above.  As an outpatient, she would benefit from tobacco cessation and valuation by pulmonology.  4. HLD: LDL of 151 from 06/13/2018 with normal LFT in 02/2018. Has previously been advised to take Lipitor.  Not taking statin at this time.   Disposition: F/u with Dr. Rockey Situ following the above work-up.  Current medicines are reviewed at length with the patient today.  The patient did not have any concerns regarding medicines.  Melvern Banker PA-C 06/17/2018 1:36 PM     [address] [place], [place] [address] [phone]

## 2018-06-17 NOTE — Telephone Encounter (Signed)
Pt c/o Shortness Of Breath: STAT if SOB developed within the last 24 hours or pt is noticeably SOB on the phone  1. Are you currently SOB (can you hear that pt is SOB on the phone)? yes  2. How long have you been experiencing SOB? 2 days ago  3. Are you SOB when sitting or when up moving around? All the time  4. Are you currently experiencing any other symptoms? No, nauseated

## 2018-06-17 NOTE — ED Provider Notes (Signed)
[facility] Emergency Department Provider Note  ____________________________________________  Time seen: Approximately 7:31 PM  I have reviewed the triage vital signs and the nursing notes.   HISTORY  Chief Complaint Chest Pain   HPI Lindsey Bautista is a 57 y.o. female with a history of COPD, diastolic CHF, CVA who presents from her cardiologist for evaluation of chest pain.  Patient reports that the pain started at 10 AM this morning.  She describes as a pressure located in the center of her chest radiating to her neck associated with shortness of breath, dizziness and nausea.  The pain lasted several hours.  She took nitro at home which did not help.  She reports that over the last few months she has been having several weekly episodes similar to this 1.  She went to see her cardiologist today who recommended that she be admitted for further cardiac evaluation since patient recently underwent a CT chest that showed calcifications in the coronary arteries.  She has no family history of heart attacks.  She smokes.  At this time patient is asymptomatic with no further episodes of chest pain.  No personal or family history of blood clots, no recent travel immobilization, no leg pain or swelling, no hemoptysis, no exogenous hormones.  No URI symptoms, cough or fever.  Past Medical History:  Diagnosis Date  . Atypical chest pain    a. 08/2016 Neg MV.  [name] COPD (chronic obstructive pulmonary disease) ([place])   . Depression   . Diastolic dysfunction    a. 10/2016 Echo: EF 60-65%, no rwma, Gr1 DD, mild MR, nl LA size, nl RV fxn.  [name] GERD (gastroesophageal reflux disease)   . Hypotension   . IBS (irritable bowel syndrome)   . Right arm weakness   . Situational syncope   . TIA (transient ischemic attack)     Patient Active Problem List   Diagnosis Date Noted  . Fibromyalgia 08/29/2017  . Right sided weakness 11/16/2016  . Conversion disorder with mixed symptoms  03/30/2016  . Abdominal aortic atherosclerosis ([place]) 12/29/2015  . Adrenal gland cyst ([place]) 12/29/2015  . Anxiety state   . Syncope and collapse   . COPD (chronic obstructive pulmonary disease) ([place]) 12/13/2015  . Chest pain 12/13/2015  . Gastroesophageal reflux disease without esophagitis   . PAD (peripheral artery disease) ([place]) 10/22/2015  . Hyperlipidemia 10/22/2015  . Cervical paraspinal muscle spasm 09/30/2015  . Benzodiazepine dependence, continuous ([place]) 09/08/2015  . Acute anxiety 06/07/2015  . Depression 06/07/2015  . Situational syncope 06/07/2015  . Smoker 04/21/2015    Past Surgical History:  Procedure Laterality Date  . COLONOSCOPY WITH PROPOFOL N/A 11/22/2015   Procedure: COLONOSCOPY WITH PROPOFOL;  Surgeon: Manya Silvas, MD;  Location: [facility] ENDOSCOPY;  Service: Endoscopy;  Laterality: N/A;  . ESOPHAGOGASTRODUODENOSCOPY (EGD) WITH PROPOFOL N/A 11/22/2015   Procedure: ESOPHAGOGASTRODUODENOSCOPY (EGD) WITH PROPOFOL;  Surgeon: Manya Silvas, MD;  [facility] ENDOSCOPY;  Service: Endoscopy;  Laterality: N/A;  . OOPHORECTOMY    . TONSILLECTOMY    . VAGINAL HYSTERECTOMY      Prior to Admission medications   Medication Sig Start Date End Date Taking? Authorizing Provider  aspirin 81 MG tablet Take 81 mg by mouth daily.   Yes [provider]  clonazePAM (KLONOPIN) 1 MG tablet TAKE 1 TABLET BY MOUTH TWICE DAILY 02/20/18  Yes Crissman, Jeannette How, MD  estradiol (ESTRACE) 1 MG tablet TAKE 1 TABLET BY MOUTH ONCE DAILY 05/20/18  Yes Golden Pop  A, MD  FLUoxetine (PROZAC) 20 MG capsule TAKE 1 CAPSULE BY MOUTH ONCE DAILY 05/22/18  Yes Crissman, Jeannette How, MD  atorvastatin (LIPITOR) 80 MG tablet Take 1 tablet (80 mg total) by mouth daily. Patient not taking: Reported on 06/17/2018 06/13/18   Bettey Costa, MD    Allergies Penicillin g and Iodinated diagnostic agents  Family History  Problem Relation Age of Onset  . Bladder Cancer Mother   . Heart disease Unknown   .  COPD Unknown   . Stroke Unknown   . Breast cancer Paternal Aunt 47  . Colon cancer Brother     Social History Social History   Tobacco Use  . Smoking status: Current Every Day Smoker    Packs/day: 1.00    Years: 39.00    Pack years: 39.00    Types: Cigarettes  . Smokeless tobacco: Never Used  Substance Use Topics  . Alcohol use: No    Alcohol/week: 0.0 standard drinks  . Drug use: No    Review of Systems  Constitutional: Negative for fever. + dizziness Eyes: Negative for visual changes. ENT: Negative for sore throat. Neck: No neck pain  Cardiovascular: + chest pain. Respiratory: + shortness of breath. Gastrointestinal: Negative for abdominal pain, vomiting or diarrhea. + nausea Genitourinary: Negative for dysuria. Musculoskeletal: Negative for back pain. Skin: Negative for rash. Neurological: Negative for headaches, weakness or numbness. Psych: No SI or HI  ____________________________________________   PHYSICAL EXAM:  VITAL SIGNS: ED Triage Vitals  Enc Vitals Group     BP 06/17/18 1424 137/73     Pulse Rate 06/17/18 1422 (!) 55     Resp 06/17/18 1422 18     Temp 06/17/18 1422 98 F (36.7 C)     Temp src --      SpO2 06/17/18 1422 96 %     Weight 06/17/18 1423 139 lb 15.9 oz (63.5 kg)     Height --      Head Circumference --      Peak Flow --      Pain Score 06/17/18 1423 10     Pain Loc --      Pain Edu? --      Excl. in [place]? --     Constitutional: Alert and oriented. Well appearing and in no apparent distress. HEENT:      Head: Normocephalic and atraumatic.         Eyes: Conjunctivae are normal. Sclera is non-icteric.       Mouth/Throat: Mucous membranes are moist.       Neck: Supple with no signs of meningismus. Cardiovascular: Regular rate and rhythm. No murmurs, gallops, or rubs. 2+ symmetrical distal pulses are present in all extremities. No JVD. Respiratory: Normal respiratory effort. Lungs are clear to auscultation bilaterally. No wheezes,  crackles, or rhonchi.  Gastrointestinal: Soft, non tender, and non distended with positive bowel sounds. No rebound or guarding. Genitourinary: No CVA tenderness. Musculoskeletal: Nontender with normal range of motion in all extremities. No edema, cyanosis, or erythema of extremities. Neurologic: Normal speech and language. Face is symmetric. Moving all extremities. No gross focal neurologic deficits are appreciated. Skin: Skin is warm, dry and intact. No rash noted. Psychiatric: Mood and affect are normal. Speech and behavior are normal.  ____________________________________________   LABS (all labs ordered are listed, but only abnormal results are displayed)  Labs Reviewed  BASIC METABOLIC PANEL - Abnormal; Notable for the following components:      Result Value   Glucose, Bld  139 (*)    All other components within normal limits  CBC  TROPONIN I  POC URINE PREG, ED   ____________________________________________  EKG  ED ECG REPORT I, Rudene Re, the attending physician, personally viewed and interpreted this ECG.  Sinus bradycardia, rate of 57, normal intervals, normal axis, no ST elevations or depressions. ____________________________________________  RADIOLOGY  I have personally reviewed the images performed during this visit and I agree with the Radiologist's read.   Interpretation by Radiologist:  Dg Chest 2 View  Result Date: 06/17/2018 CLINICAL DATA:  Chest pain. EXAM: CHEST - 2 VIEW COMPARISON:  Radiograph of June 12, 2018 FINDINGS: The heart size and mediastinal contours are within normal limits. Both lungs are clear. No pneumothorax or pleural effusion is noted. The visualized skeletal structures are unremarkable. IMPRESSION: No active cardiopulmonary disease. Electronically Signed   By: Marijo Conception, M.D.   On: 06/17/2018 14:55     ____________________________________________   PROCEDURES  Procedure(s) performed: None Procedures Critical  Care performed: yes  CRITICAL CARE Performed by: Rudene Re  ?  Total critical care time: 35 min  Critical care time was exclusive of separately billable procedures and treating other patients.  Critical care was necessary to treat or prevent imminent or life-threatening deterioration.  Critical care was time spent personally by me on the following activities: development of treatment plan with patient and/or surrogate as well as nursing, discussions with consultants, evaluation of patient's response to treatment, examination of patient, obtaining history from patient or surrogate, ordering and performing treatments and interventions, ordering and review of laboratory studies, ordering and review of radiographic studies, pulse oximetry and re-evaluation of patient's condition.  ____________________________________________   INITIAL IMPRESSION / ASSESSMENT AND PLAN / ED COURSE   57 y.o. female with a history of COPD, diastolic CHF, CVA who presents from her cardiologist for evaluation of chest pain.   History of chest pain concerning for possible cardiac etiology. Exam is benign. EKG and initial cardiac labs are without evidence of ischemia. Clinical picture and evaluation not suggestive of other serious cause including pneumonia, pulmonary embolism, pneumothorax, esophageal rupture, or aortic dissection. Patient will be admitted for chest pain evaluation.  I discussed the diagnosis and plan of care with the patient and answered all her questions. She states understanding and is in agreement with admission for further work up.       As part of my medical decision making, I reviewed the following data within the [facility] notes reviewed and incorporated, EKG interpreted , Old EKG reviewed, Old chart reviewed, Radiograph reviewed , Discussed with admitting physician , Notes from prior ED visits and [place] Controlled Substance Database    Pertinent labs &  imaging results that were available during my care of the patient were reviewed by me and considered in my medical decision making (see chart for details).    ____________________________________________   FINAL CLINICAL IMPRESSION(S) / ED DIAGNOSES  Final diagnoses:  Unstable angina ([place])      NEW MEDICATIONS STARTED DURING THIS VISIT:  ED Discharge Orders    None       Note:  This document was prepared using Dragon voice recognition software and may include unintentional dictation errors.    Rudene Re, MD 06/17/18 1949

## 2018-06-17 NOTE — ED Triage Notes (Signed)
Pt c/o chest pain and shortness of breath since 1000.

## 2018-06-17 NOTE — ED Notes (Signed)
Pt resting comfortably with no complaints, awaiting inpatient bed assignment.

## 2018-06-17 NOTE — ED Notes (Signed)
Pt in with co chest pain that woke her up at 1000 today has hx of CAD, no hx of MI. Did see cardiologist today and was told to come to ED. Pt states she did feel shob and nauseous during chest pain states rates it 0/10 at this time.

## 2018-06-17 NOTE — ED Notes (Signed)
Pt uprite on stretcher in darkened exam room with no distress noted; pt denies any c/o at present; voices good understanding of plan of care and NPO status

## 2018-06-18 ENCOUNTER — Other Ambulatory Visit: Payer: Self-pay

## 2018-06-18 DIAGNOSIS — I25110 Atherosclerotic heart disease of native coronary artery with unstable angina pectoris: Secondary | ICD-10-CM | POA: Diagnosis not present

## 2018-06-18 DIAGNOSIS — R531 Weakness: Secondary | ICD-10-CM | POA: Diagnosis not present

## 2018-06-18 DIAGNOSIS — E785 Hyperlipidemia, unspecified: Secondary | ICD-10-CM | POA: Diagnosis not present

## 2018-06-18 DIAGNOSIS — I200 Unstable angina: Secondary | ICD-10-CM | POA: Diagnosis not present

## 2018-06-18 DIAGNOSIS — R51 Headache: Secondary | ICD-10-CM | POA: Diagnosis not present

## 2018-06-18 DIAGNOSIS — R079 Chest pain, unspecified: Secondary | ICD-10-CM | POA: Diagnosis not present

## 2018-06-18 DIAGNOSIS — Z720 Tobacco use: Secondary | ICD-10-CM | POA: Diagnosis not present

## 2018-06-18 LAB — TROPONIN I

## 2018-06-18 MED ORDER — ASPIRIN 81 MG PO CHEW
81.0000 mg | CHEWABLE_TABLET | ORAL | Status: AC
  Administered 2018-06-19: 81 mg via ORAL
  Filled 2018-06-18: qty 1

## 2018-06-18 MED ORDER — DIPHENHYDRAMINE HCL 25 MG PO CAPS
50.0000 mg | ORAL_CAPSULE | Freq: Once | ORAL | Status: AC
  Administered 2018-06-19: 50 mg via ORAL
  Filled 2018-06-18: qty 2

## 2018-06-18 MED ORDER — PREDNISONE 50 MG PO TABS
50.0000 mg | ORAL_TABLET | Freq: Four times a day (QID) | ORAL | Status: AC
  Administered 2018-06-18 – 2018-06-19 (×3): 50 mg via ORAL
  Filled 2018-06-18 (×3): qty 1

## 2018-06-18 MED ORDER — SODIUM CHLORIDE 0.9% FLUSH: 3.0000 mL | INTRAVENOUS | Status: DC | PRN

## 2018-06-18 MED ORDER — SODIUM CHLORIDE 0.9 % WEIGHT BASED INFUSION
1.0000 mL/kg/h | INTRAVENOUS | Status: DC
  Administered 2018-06-19: 1 mL/kg/h via INTRAVENOUS

## 2018-06-18 MED ORDER — DIPHENHYDRAMINE HCL 50 MG/ML IJ SOLN: 50.0000 mg | Freq: Once | INTRAMUSCULAR | Status: AC

## 2018-06-18 MED ORDER — SODIUM CHLORIDE 0.9% FLUSH
3.0000 mL | Freq: Two times a day (BID) | INTRAVENOUS | Status: DC
  Administered 2018-06-18 – 2018-06-19 (×2): 3 mL via INTRAVENOUS

## 2018-06-18 MED ORDER — SODIUM CHLORIDE 0.9 % WEIGHT BASED INFUSION
3.0000 mL/kg/h | INTRAVENOUS | Status: DC
  Administered 2018-06-19: 3 mL/kg/h via INTRAVENOUS

## 2018-06-18 MED ORDER — SODIUM CHLORIDE 0.9 % IV SOLN: 250.0000 mL | INTRAVENOUS | Status: DC | PRN

## 2018-06-18 NOTE — Progress Notes (Signed)
Admitted from ED for evaluation of CP radiating to left jaw and arm. A&O, in no distress other than a headache.

## 2018-06-18 NOTE — ED Notes (Signed)
Pt resting with eyes closed on stretcher in darkened exam room with no distress noted; resp even/unlab

## 2018-06-18 NOTE — ED Notes (Signed)
Pt awakens easily for lab draw; denies c/o; up to room commode to void

## 2018-06-18 NOTE — H&P (View-Only) (Signed)
Cardiology Consultation:   Patient ID: Lindsey Bautista MRN: [id number]; DOB: [birth year]  Admit date: 06/17/2018 Date of Consult: 06/18/2018  Primary Care Provider: [name], MD Primary Cardiologist: Lindsey Rogue, MD Primary Electrophysiologist:  None   Patient Profile:   Lindsey Bautista is a 57 y.o. female with a hx of  atypical chest pain, chronic dyspnea on exertion with ongoing tobacco abuse at at least 1 pack daily, previously 2 packs daily since she was 57 years old, stroke symptoms in 2015, situational syncope, depression, anxiety, conversion disorder, HLD, and noncompliance, who is being seen today for the evaluation of chest pain at the request of Dr. Vianne Bautista.  History of Present Illness:   [name] has a long history of intermittent chest pain.  Yesterday morning, she developed severe substernal chest pressure and shortness of breath while lying in bed.  She contacted our office and was seen urgently by Lindsey Faith, PA.  Exam and EKG were unrevealing at that time.  However, she continued to have 8/10 substernal chest pressure, prompting referral to the emergency department.  In the ED, chest pain resolved.  She notes that nitroglycerin helped to relieve the discomfort.  She is currently only complaining of a headache.  She has noted occasional fluttering of her heart, though it is unclear if this is related to her chest pain.  She denies orthopnea, PND, and edema.  ED work-up was notable for nonacute EKG and negative troponin x3.  She reports recently being told that she has some sort of abnormality in her head contributing to her headache.  She was recently seen at [facility] for transient right-sided weakness.  However, CTs of the head on 05/15/2018 and 06/12/2018 were unremarkable except for small vessel ischemic disease.  Brain MRI on 06/12/2018 was normal.  Carotid Dopplers have shown mild plaquing of the left internal carotid artery with less than 50% stenosis.  Past Medical  History:  Diagnosis Date  . Atypical chest pain    a. 08/2016 Neg MV.  [name] COPD (chronic obstructive pulmonary disease) ([place])   . Depression   . Diastolic dysfunction    a. 10/2016 Echo: EF 60-65%, no rwma, Gr1 DD, mild MR, nl LA size, nl RV fxn.  [name] GERD (gastroesophageal reflux disease)   . Hypotension   . IBS (irritable bowel syndrome)   . Right arm weakness   . Situational syncope   . TIA (transient ischemic attack)     Past Surgical History:  Procedure Laterality Date  . COLONOSCOPY WITH PROPOFOL N/A 11/22/2015   Procedure: COLONOSCOPY WITH PROPOFOL;  Surgeon: Lindsey Silvas, MD;  Location: [facility] ENDOSCOPY;  Service: Endoscopy;  Laterality: N/A;  . ESOPHAGOGASTRODUODENOSCOPY (EGD) WITH PROPOFOL N/A 11/22/2015   Procedure: ESOPHAGOGASTRODUODENOSCOPY (EGD) WITH PROPOFOL;  Surgeon: Lindsey Silvas, MD;  Location: [facility] ENDOSCOPY;  Service: Endoscopy;  Laterality: N/A;  . OOPHORECTOMY    . TONSILLECTOMY    . VAGINAL HYSTERECTOMY       Home Medications:  Prior to Admission medications   Medication Sig Start Date Lindsey Bautista Date Taking? Authorizing Provider  aspirin 81 MG tablet Take 81 mg by mouth daily.   Yes [provider]  clonazePAM (KLONOPIN) 1 MG tablet TAKE 1 TABLET BY MOUTH TWICE DAILY 02/20/18  Yes Bautista, Lindsey How, MD  estradiol (ESTRACE) 1 MG tablet TAKE 1 TABLET BY MOUTH ONCE DAILY 05/20/18  Yes Bautista, Lindsey How, MD  FLUoxetine (PROZAC) 20 MG capsule TAKE 1 CAPSULE BY MOUTH ONCE DAILY 05/22/18  Yes  Lindsey Maple, MD  atorvastatin (LIPITOR) 80 MG tablet Take 1 tablet (80 mg total) by mouth daily. Patient not taking: Reported on 06/17/2018 06/13/18   Lindsey Costa, MD    Inpatient Medications: Scheduled Meds: . [START ON 06/19/2018] aspirin  81 mg Oral Pre-Cath  . aspirin EC  81 mg Oral Daily  . atorvastatin  80 mg Oral Daily  . clonazePAM  1 mg Oral BID  . diphenhydrAMINE  50 mg Oral Once   Or  . diphenhydrAMINE  50 mg Intravenous Once  . enoxaparin (LOVENOX)  injection  40 mg Subcutaneous Q24H  . estradiol  1 mg Oral Daily  . FLUoxetine  20 mg Oral Daily  . predniSONE  50 mg Oral Q6H  . sodium chloride flush  3 mL Intravenous Q12H  . sodium chloride flush  3 mL Intravenous Q12H   Continuous Infusions: . sodium chloride    . [START ON 06/19/2018] sodium chloride     Followed by  . [START ON 06/19/2018] sodium chloride     PRN Meds: sodium chloride, acetaminophen **OR** acetaminophen, albuterol, ondansetron **OR** ondansetron (ZOFRAN) IV, polyethylene glycol, sodium chloride flush  Allergies:    Allergies  Allergen Reactions  . Penicillin G Anaphylaxis and Other (See Comments)    Has patient had a PCN reaction causing immediate rash, facial/tongue/throat swelling, SOB or lightheadedness with hypotension: [id number]} Has patient had a PCN reaction causing severe rash involving mucus membranes or skin necrosis: no:[id number]} Has patient had a PCN reaction that required hospitalization no:[id number]} Has patient had a PCN reaction occurring within the last 10 years: [id number]} If all of the above answers are "NO", then may proceed with Cephalosporin use.   . Iodinated Diagnostic Agents Rash and Nausea And Vomiting    Social History:   Social History   Socioeconomic History  . Marital status: Married    Spouse name: Lindsey Bautista  . Number of children: 2  . Years of education: 50  . Highest education level: High school graduate  Occupational History  . Occupation: Unemployed  Social Needs  . Financial resource strain: Not on file  . Food insecurity:    Worry: Not on file    Inability: Not on file  . Transportation needs:    Medical: Not on file    Non-medical: Not on file  Tobacco Use  . Smoking status: Current Every Day Smoker    Packs/day: 1.00    Years: 39.00    Pack years: 39.00    Types: Cigarettes  . Smokeless tobacco: Never Used  Substance and Sexual Activity  . Alcohol use: No    Alcohol/week: 0.0  standard drinks  . Drug use: No  . Sexual activity: Yes  Lifestyle  . Physical activity:    Days per week: Not on file    Minutes per session: Not on file  . Stress: Not at all  Relationships  . Social connections:    Talks on phone: Not on file    Gets together: Not on file    Attends religious service: Not on file    Active member of club or organization: Not on file    Attends meetings of clubs or organizations: Not on file    Relationship status: Married  . Intimate partner violence:    Fear of current or ex partner: No    Emotionally abused: No    Physically abused: No    Forced sexual activity: No  Other Topics Concern  . Not on  file  Social History Narrative   Lives at home with husband.    Caffeine use: 1 cup coffee per day    Family History:   Family History  Problem Relation Age of Onset  . Bladder Cancer Mother   . Heart disease Unknown   . COPD Unknown   . Stroke Unknown   . Breast cancer Paternal Aunt 72  . Colon cancer Brother      ROS:  Please see the history of present illness.   All other ROS reviewed and negative.     Physical Exam/Data:   Vitals:   06/18/18 0700 06/18/18 0800 06/18/18 1300 06/18/18 1337  BP: (!) 98/59 102/60 131/69 127/61  Pulse: (!) 54 (!) 57 (!) 58 (!) 59  Resp: 13 14 15 19   Temp:    98.7 F (37.1 C)  TempSrc:    Oral  SpO2: 97% 94% 96% 98%  Weight:    64.4 kg  Height:    5\' 1"  (1.549 m)   No intake or output data in the 24 hours ending 06/18/18 1542 Filed Weights   06/17/18 1423 06/17/18 2017 06/18/18 1337  Weight: 63.5 kg 64.9 kg 64.4 kg   Body mass index is 26.83 kg/m.  General:  Well nourished, well developed, in no acute distress.  She is accompanied by her husband. HEENT: normal Lymph: no adenopathy Neck: no JVD Endocrine:  No thryomegaly Vascular: No carotid bruits; FA pulses 2+ bilaterally without bruits  Cardiac: Bradycardic but regular without murmurs or rubs. Lungs:  clear to auscultation  bilaterally, no wheezing, rhonchi or rales  Abd: soft, nontender, no hepatomegaly  Ext: no edema Musculoskeletal:  No deformities, BUE and BLE strength normal and equal Skin: warm and dry  Neuro:  CNs 3-12 intact, no focal abnormalities noted Psych:  Normal affect   EKG:  The EKG was personally reviewed and demonstrates: Sinus bradycardia with poor R wave progression from V2 to V3, which may reflect lead placement. Telemetry:  Telemetry was personally reviewed and demonstrates: Normal sinus rhythm and artifact.  Relevant CV Studies: Echocardiogram (06/12/2018): Normal LV size and function.  LVEF 60-65% with normal diastolic function.  Mild mitral regurgitation.  Normal RV size and function.  No PFO or other right to left shunt.  Normal PA pressure.  Pharmacologic myocardial perfusion stress test (09/20/2016): Low risk study without ischemia.  LVEF 81%.  Laboratory Data:  Chemistry Recent Labs  Lab 06/12/18 1237 06/17/18 1533  NA 141 139  K 4.1 3.5  CL 106 102  CO2 30 28  GLUCOSE 82 139*  BUN 10 9  CREATININE 0.85 0.84  CALCIUM 8.7* 9.2  GFRNONAA >60 >60  GFRAA >60 >60  ANIONGAP 5 9    No results for input(s): PROT, ALBUMIN, AST, ALT, ALKPHOS, BILITOT in the last 168 hours. Hematology Recent Labs  Lab 06/12/18 1237 06/17/18 1424  WBC 3.8* 5.8  RBC 4.82 4.82  HGB 14.4 14.5  HCT 43.4 42.8  MCV 90.0 88.8  MCH 29.9 30.1  MCHC 33.2 33.9  RDW 12.8 12.8  PLT 284 294   Cardiac Enzymes Recent Labs  Lab 06/12/18 1237 06/17/18 1533 06/17/18 2258 06/18/18 0502  TROPONINI <0.03 <0.03 <0.03 <0.03   No results for input(s): TROPIPOC in the last 168 hours.  BNPNo results for input(s): BNP, PROBNP in the last 168 hours.  DDimer No results for input(s): DDIMER in the last 168 hours.  Radiology/Studies:  Dg Chest 2 View  Result Date: 06/17/2018 CLINICAL DATA:  Chest pain. EXAM: CHEST - 2 VIEW COMPARISON:  Radiograph of June 12, 2018 FINDINGS: The heart size and  mediastinal contours are within normal limits. Both lungs are clear. No pneumothorax or pleural effusion is noted. The visualized skeletal structures are unremarkable. IMPRESSION: No active cardiopulmonary disease. Electronically Signed   By: Marijo Conception, M.D.   On: 06/17/2018 14:55    Assessment and Plan:   Unstable angina Patient has experienced recurrent chest pain at rest, most recently yesterday.  She is currently chest pain-free.  Prior work-up including echo and myocardial perfusion stress test have been unrevealing.  EKG does not show any acute ischemic changes.  I reviewed unenhanced CT of the chest from last month, which showed minimal coronary artery calcification involving the RCA.  Overall, I think the likelihood of her having obstructive coronary artery disease is low.  However, given recurrent symptoms, including pain at rest leading to hospitalization, we have agreed that more definitive coronary assessment is indicated.  We discussed outpatient cardiac CT versus cardiac catheterization.  Ms. Monda would like to proceed with catheterization.  Given her history of IV contrast allergy, we will premedicate her using the 13-hour protocol with prednisone and diphenhydramine.  I have reviewed the risks, indications, and alternatives to cardiac catheterization, possible angioplasty, and stenting with the patient. Risks include but are not limited to bleeding, infection, vascular injury, stroke, myocardial infection, arrhythmia, kidney injury, radiation-related injury in the case of prolonged fluoroscopy use, emergency cardiac surgery, and death. The patient understands the risks of serious complication is 1-2 in 1287 with diagnostic cardiac cath and 1-2% or less with angioplasty/stenting.  Smoking cessation was advised.  I recommend continuation of atorvastatin and low-dose aspirin.  Headache Uncertain etiology.  Recent brain imaging did not show any significant abnormalities.  Defer work-up  to internal medicine and the patient's outpatient neurologist.  For questions or updates, please contact [place] Please consult [email or web address] for contact info under [facility].  Signed, Nelva Bush, MD  06/18/2018 3:42 PM

## 2018-06-18 NOTE — Progress Notes (Signed)
[facility] [place] NAME: Lindsey Bautista    MR#:  [id number]  DATE OF BIRTH:  Mar 07, 1961  SUBJECTIVE: Patient admitted for recurrent chest pain.  She is patient admitted from cardiologist office.  No further chest pain, troponins have been negative x3.  CHIEF COMPLAINT:   Chief Complaint  Patient presents with  . Chest Pain  no further chest pain.  REVIEW OF SYSTEMS:    Review of Systems  Constitutional: Negative for chills and fever.  HENT: Negative for hearing loss.   Eyes: Negative for blurred vision, double vision and photophobia.  Respiratory: Negative for cough, hemoptysis and shortness of breath.   Cardiovascular: Negative for palpitations, orthopnea and leg swelling.  Gastrointestinal: Negative for abdominal pain, diarrhea and vomiting.  Genitourinary: Negative for dysuria and urgency.  Musculoskeletal: Negative for myalgias and neck pain.  Skin: Negative for rash.  Neurological: Negative for dizziness, focal weakness, seizures, weakness and headaches.  Psychiatric/Behavioral: Negative for memory loss. The patient does not have insomnia.     Nutrition:  Tolerating Diet: Tolerating PT:      DRUG ALLERGIES:   Allergies  Allergen Reactions  . Penicillin G Anaphylaxis and Other (See Comments)    Has patient had a PCN reaction causing immediate rash, facial/tongue/throat swelling, SOB or lightheadedness with hypotension: [id number]} Has patient had a PCN reaction causing severe rash involving mucus membranes or skin necrosis: no:[id number]} Has patient had a PCN reaction that required hospitalization no:[id number]} Has patient had a PCN reaction occurring within the last 10 years: [id number]} If all of the above answers are "NO", then may proceed with Cephalosporin use.   . Iodinated Diagnostic Agents Rash and Nausea And Vomiting    VITALS:  Blood pressure 127/61, pulse (!) 59, temperature 98.7 F (37.1 C),  temperature source Oral, resp. rate 19, height 5\' 1"  (1.549 m), weight 64.9 kg, SpO2 98 %.  PHYSICAL EXAMINATION:   Physical Exam  GENERAL:  57 y.o.-year-old patient lying in the bed with no acute distress.  EYES: Pupils equal, round, reactive to light and accommodation. No scleral icterus. Extraocular muscles intact.  HEENT: Head atraumatic, normocephalic. Oropharynx and nasopharynx clear.  NECK:  Supple, no jugular venous distention. No thyroid enlargement, no tenderness.  LUNGS: Normal breath sounds bilaterally, no wheezing, rales,rhonchi or crepitation. No use of accessory muscles of respiration.  CARDIOVASCULAR: S1, S2 normal. No murmurs, rubs, or gallops.  ABDOMEN: Soft, nontender, nondistended. Bowel sounds present. No organomegaly or mass.  EXTREMITIES: No pedal edema, cyanosis, or clubbing.  NEUROLOGIC: Cranial nerves II through XII are intact. Muscle strength 5/5 in all extremities. Sensation intact. Gait not checked.  PSYCHIATRIC: The patient is alert and oriented x 3.  SKIN: No obvious rash, lesion, or ulcer.    LABORATORY PANEL:   CBC Recent Labs  Lab 06/17/18 1424  WBC 5.8  HGB 14.5  HCT 42.8  PLT 294   ------------------------------------------------------------------------------------------------------------------  Chemistries  Recent Labs  Lab 06/17/18 1533  NA 139  K 3.5  CL 102  CO2 28  GLUCOSE 139*  BUN 9  CREATININE 0.84  CALCIUM 9.2   ------------------------------------------------------------------------------------------------------------------  Cardiac Enzymes Recent Labs  Lab 06/18/18 0502  TROPONINI <0.03   ------------------------------------------------------------------------------------------------------------------  RADIOLOGY:  Dg Chest 2 View  Result Date: 06/17/2018 CLINICAL DATA:  Chest pain. EXAM: CHEST - 2 VIEW COMPARISON:  Radiograph of June 12, 2018 FINDINGS: The heart size and mediastinal contours are within normal  limits. Both lungs are clear. No pneumothorax  or pleural effusion is noted. The visualized skeletal structures are unremarkable. IMPRESSION: No active cardiopulmonary disease. Electronically Signed   By: Marijo Conception, M.D.   On: 06/17/2018 14:55     ASSESSMENT AND PLAN:   Active Problems:   Chest pain  Recurrent chest pains, troponins have been negative, patient seen by Dr. Thurmond Butts from cardiology yesterday, sent the patient here for possible cardiac cath, discussed the case with Dr. Saunders Revel today, troponins have been negative, patient has allergy to contrast so she cannot have cardiac cath today, started on the diet, cardiology to decide on either stress test or cardiac cath. History of CVA on aspirin: No weakness    All the records are reviewed and case discussed with Care Management/Social Workerr. Management plans discussed with the patient, family and they are in agreement.  CODE STATUS:full code  TOTAL TIME TAKING CARE OF THIS PATIENT: 35 minutes.   POSSIBLE D/C IN 1-2DAYS, DEPENDING ON CLINICAL CONDITION.   Epifanio Lesches M.D on 06/18/2018 at 2:14 PM  Between 7am to 6pm - Pager - [phone]  After 6pm go to [email or web address] - password [email or web address] [facility]  Office  [phone]  CC: Primary care physician; [name], MD

## 2018-06-18 NOTE — Consult Note (Signed)
Cardiology Consultation:   Patient ID: Lindsey Bautista MRN: [id number]; DOB: 1960-11-01  Admit date: 06/17/2018 Date of Consult: 06/18/2018  Primary Care Provider: [name], MD Primary Cardiologist: Ida Rogue, MD Primary Electrophysiologist:  None   Patient Profile:   Lindsey Bautista is a 57 y.o. female with a hx of  atypical chest pain, chronic dyspnea on exertion with ongoing tobacco abuse at at least 1 pack daily, previously 2 packs daily since she was 57 years old, stroke symptoms in 2015, situational syncope, depression, anxiety, conversion disorder, HLD, and noncompliance, who is being seen today for the evaluation of chest pain at the request of Dr. Vianne Bulls.  History of Present Illness:   Lindsey Bautista has a long history of intermittent chest pain.  Yesterday morning, she developed severe substernal chest pressure and shortness of breath while lying in bed.  She contacted our office and was seen urgently by Christell Faith, PA.  Exam and EKG were unrevealing at that time.  However, she continued to have 8/10 substernal chest pressure, prompting referral to the emergency department.  In the ED, chest pain resolved.  She notes that nitroglycerin helped to relieve the discomfort.  She is currently only complaining of a headache.  She has noted occasional fluttering of her heart, though it is unclear if this is related to her chest pain.  She denies orthopnea, PND, and edema.  ED work-up was notable for nonacute EKG and negative troponin x3.  She reports recently being told that she has some sort of abnormality in her head contributing to her headache.  She was recently seen at [facility] for transient right-sided weakness.  However, CTs of the head on 05/15/2018 and 06/12/2018 were unremarkable except for small vessel ischemic disease.  Brain MRI on 06/12/2018 was normal.  Carotid Dopplers have shown mild plaquing of the left internal carotid artery with less than 50% stenosis.  Past Medical  History:  Diagnosis Date  . Atypical chest pain    a. 08/2016 Neg MV.  [name] COPD (chronic obstructive pulmonary disease) ([place])   . Depression   . Diastolic dysfunction    a. 10/2016 Echo: EF 60-65%, no rwma, Gr1 DD, mild MR, nl LA size, nl RV fxn.  [name] GERD (gastroesophageal reflux disease)   . Hypotension   . IBS (irritable bowel syndrome)   . Right arm weakness   . Situational syncope   . TIA (transient ischemic attack)     Past Surgical History:  Procedure Laterality Date  . COLONOSCOPY WITH PROPOFOL N/A 11/22/2015   Procedure: COLONOSCOPY WITH PROPOFOL;  Surgeon: Manya Silvas, MD;  Location: [facility] ENDOSCOPY;  Service: Endoscopy;  Laterality: N/A;  . ESOPHAGOGASTRODUODENOSCOPY (EGD) WITH PROPOFOL N/A 11/22/2015   Procedure: ESOPHAGOGASTRODUODENOSCOPY (EGD) WITH PROPOFOL;  Surgeon: Manya Silvas, MD;  Location: [facility] ENDOSCOPY;  Service: Endoscopy;  Laterality: N/A;  . OOPHORECTOMY    . TONSILLECTOMY    . VAGINAL HYSTERECTOMY       Home Medications:  Prior to Admission medications   Medication Sig Start Date Amiel Mccaffrey Date Taking? Authorizing Provider  aspirin 81 MG tablet Take 81 mg by mouth daily.   Yes [provider]  clonazePAM (KLONOPIN) 1 MG tablet TAKE 1 TABLET BY MOUTH TWICE DAILY 02/20/18  Yes Crissman, Jeannette How, MD  estradiol (ESTRACE) 1 MG tablet TAKE 1 TABLET BY MOUTH ONCE DAILY 05/20/18  Yes Crissman, Jeannette How, MD  FLUoxetine (PROZAC) 20 MG capsule TAKE 1 CAPSULE BY MOUTH ONCE DAILY 05/22/18  Yes  [name], MD  atorvastatin (LIPITOR) 80 MG tablet Take 1 tablet (80 mg total) by mouth daily. Patient not taking: Reported on 06/17/2018 06/13/18   Bettey Costa, MD    Inpatient Medications: Scheduled Meds: . [START ON 06/19/2018] aspirin  81 mg Oral Pre-Cath  . aspirin EC  81 mg Oral Daily  . atorvastatin  80 mg Oral Daily  . clonazePAM  1 mg Oral BID  . diphenhydrAMINE  50 mg Oral Once   Or  . diphenhydrAMINE  50 mg Intravenous Once  . enoxaparin (LOVENOX)  injection  40 mg Subcutaneous Q24H  . estradiol  1 mg Oral Daily  . FLUoxetine  20 mg Oral Daily  . predniSONE  50 mg Oral Q6H  . sodium chloride flush  3 mL Intravenous Q12H  . sodium chloride flush  3 mL Intravenous Q12H   Continuous Infusions: . sodium chloride    . [START ON 06/19/2018] sodium chloride     Followed by  . [START ON 06/19/2018] sodium chloride     PRN Meds: sodium chloride, acetaminophen **OR** acetaminophen, albuterol, ondansetron **OR** ondansetron (ZOFRAN) IV, polyethylene glycol, sodium chloride flush  Allergies:    Allergies  Allergen Reactions  . Penicillin G Anaphylaxis and Other (See Comments)    Has patient had a PCN reaction causing immediate rash, facial/tongue/throat swelling, SOB or lightheadedness with hypotension: [id number]} Has patient had a PCN reaction causing severe rash involving mucus membranes or skin necrosis: no:[id number]} Has patient had a PCN reaction that required hospitalization no:[id number]} Has patient had a PCN reaction occurring within the last 10 years: [id number]} If all of the above answers are "NO", then may proceed with Cephalosporin use.   . Iodinated Diagnostic Agents Rash and Nausea And Vomiting    Social History:   Social History   Socioeconomic History  . Marital status: Married    Spouse name: Maryanne Huneycutt  . Number of children: 2  . Years of education: 12  . Highest education level: High school graduate  Occupational History  . Occupation: Unemployed  Social Needs  . Financial resource strain: Not on file  . Food insecurity:    Worry: Not on file    Inability: Not on file  . Transportation needs:    Medical: Not on file    Non-medical: Not on file  Tobacco Use  . Smoking status: Current Every Day Smoker    Packs/day: 1.00    Years: 39.00    Pack years: 39.00    Types: Cigarettes  . Smokeless tobacco: Never Used  Substance and Sexual Activity  . Alcohol use: No    Alcohol/week: 0.0  standard drinks  . Drug use: No  . Sexual activity: Yes  Lifestyle  . Physical activity:    Days per week: Not on file    Minutes per session: Not on file  . Stress: Not at all  Relationships  . Social connections:    Talks on phone: Not on file    Gets together: Not on file    Attends religious service: Not on file    Active member of club or organization: Not on file    Attends meetings of clubs or organizations: Not on file    Relationship status: Married  . Intimate partner violence:    Fear of current or ex partner: No    Emotionally abused: No    Physically abused: No    Forced sexual activity: No  Other Topics Concern  . Not on  file  Social History Narrative   Lives at home with husband.    Caffeine use: 1 cup coffee per day    Family History:   Family History  Problem Relation Age of Onset  . Bladder Cancer Mother   . Heart disease Unknown   . COPD Unknown   . Stroke Unknown   . Breast cancer Paternal Aunt 75  . Colon cancer Brother      ROS:  Please see the history of present illness.   All other ROS reviewed and negative.     Physical Exam/Data:   Vitals:   06/18/18 0700 06/18/18 0800 06/18/18 1300 06/18/18 1337  BP: (!) 98/59 102/60 131/69 127/61  Pulse: (!) 54 (!) 57 (!) 58 (!) 59  Resp: 13 14 15 19   Temp:    98.7 F (37.1 C)  TempSrc:    Oral  SpO2: 97% 94% 96% 98%  Weight:    64.4 kg  Height:    5\' 1"  (1.549 m)   No intake or output data in the 24 hours ending 06/18/18 1542 Filed Weights   06/17/18 1423 06/17/18 2017 06/18/18 1337  Weight: 63.5 kg 64.9 kg 64.4 kg   Body mass index is 26.83 kg/m.  General:  Well nourished, well developed, in no acute distress.  She is accompanied by her husband. HEENT: normal Lymph: no adenopathy Neck: no JVD Endocrine:  No thryomegaly Vascular: No carotid bruits; FA pulses 2+ bilaterally without bruits  Cardiac: Bradycardic but regular without murmurs or rubs. Lungs:  clear to auscultation  bilaterally, no wheezing, rhonchi or rales  Abd: soft, nontender, no hepatomegaly  Ext: no edema Musculoskeletal:  No deformities, BUE and BLE strength normal and equal Skin: warm and dry  Neuro:  CNs 3-12 intact, no focal abnormalities noted Psych:  Normal affect   EKG:  The EKG was personally reviewed and demonstrates: Sinus bradycardia with poor R wave progression from V2 to V3, which may reflect lead placement. Telemetry:  Telemetry was personally reviewed and demonstrates: Normal sinus rhythm and artifact.  Relevant CV Studies: Echocardiogram (06/12/2018): Normal LV size and function.  LVEF 60-65% with normal diastolic function.  Mild mitral regurgitation.  Normal RV size and function.  No PFO or other right to left shunt.  Normal PA pressure.  Pharmacologic myocardial perfusion stress test (09/20/2016): Low risk study without ischemia.  LVEF 81%.  Laboratory Data:  Chemistry Recent Labs  Lab 06/12/18 1237 06/17/18 1533  NA 141 139  K 4.1 3.5  CL 106 102  CO2 30 28  GLUCOSE 82 139*  BUN 10 9  CREATININE 0.85 0.84  CALCIUM 8.7* 9.2  GFRNONAA >60 >60  GFRAA >60 >60  ANIONGAP 5 9    No results for input(s): PROT, ALBUMIN, AST, ALT, ALKPHOS, BILITOT in the last 168 hours. Hematology Recent Labs  Lab 06/12/18 1237 06/17/18 1424  WBC 3.8* 5.8  RBC 4.82 4.82  HGB 14.4 14.5  HCT 43.4 42.8  MCV 90.0 88.8  MCH 29.9 30.1  MCHC 33.2 33.9  RDW 12.8 12.8  PLT 284 294   Cardiac Enzymes Recent Labs  Lab 06/12/18 1237 06/17/18 1533 06/17/18 2258 06/18/18 0502  TROPONINI <0.03 <0.03 <0.03 <0.03   No results for input(s): TROPIPOC in the last 168 hours.  BNPNo results for input(s): BNP, PROBNP in the last 168 hours.  DDimer No results for input(s): DDIMER in the last 168 hours.  Radiology/Studies:  Dg Chest 2 View  Result Date: 06/17/2018 CLINICAL DATA:  Chest pain. EXAM: CHEST - 2 VIEW COMPARISON:  Radiograph of June 12, 2018 FINDINGS: The heart size and  mediastinal contours are within normal limits. Both lungs are clear. No pneumothorax or pleural effusion is noted. The visualized skeletal structures are unremarkable. IMPRESSION: No active cardiopulmonary disease. Electronically Signed   By: Marijo Conception, M.D.   On: 06/17/2018 14:55    Assessment and Plan:   Unstable angina Patient has experienced recurrent chest pain at rest, most recently yesterday.  She is currently chest pain-free.  Prior work-up including echo and myocardial perfusion stress test have been unrevealing.  EKG does not show any acute ischemic changes.  I reviewed unenhanced CT of the chest from last month, which showed minimal coronary artery calcification involving the RCA.  Overall, I think the likelihood of her having obstructive coronary artery disease is low.  However, given recurrent symptoms, including pain at rest leading to hospitalization, we have agreed that more definitive coronary assessment is indicated.  We discussed outpatient cardiac CT versus cardiac catheterization.  Ms. Dagher would like to proceed with catheterization.  Given her history of IV contrast allergy, we will premedicate her using the 13-hour protocol with prednisone and diphenhydramine.  I have reviewed the risks, indications, and alternatives to cardiac catheterization, possible angioplasty, and stenting with the patient. Risks include but are not limited to bleeding, infection, vascular injury, stroke, myocardial infection, arrhythmia, kidney injury, radiation-related injury in the case of prolonged fluoroscopy use, emergency cardiac surgery, and death. The patient understands the risks of serious complication is 1-2 in 6712 with diagnostic cardiac cath and 1-2% or less with angioplasty/stenting.  Smoking cessation was advised.  I recommend continuation of atorvastatin and low-dose aspirin.  Headache Uncertain etiology.  Recent brain imaging did not show any significant abnormalities.  Defer work-up  to internal medicine and the patient's outpatient neurologist.  For questions or updates, please contact [place] Please consult [email or web address] for contact info under [facility].  Signed, Nelva Bush, MD  06/18/2018 3:42 PM

## 2018-06-18 NOTE — ED Notes (Signed)
Pt resting quietly with eyes closed on stretcher in darkened exam room with no distress noted; resp even/unlab 

## 2018-06-19 ENCOUNTER — Encounter
Admission: EM | Disposition: A | Discharge status: Home / Self Care | Payer: Self-pay | Source: Home / Self Care | Attending: Emergency Medicine

## 2018-06-19 DIAGNOSIS — I200 Unstable angina: Secondary | ICD-10-CM

## 2018-06-19 DIAGNOSIS — R531 Weakness: Secondary | ICD-10-CM | POA: Diagnosis not present

## 2018-06-19 DIAGNOSIS — R079 Chest pain, unspecified: Secondary | ICD-10-CM | POA: Diagnosis not present

## 2018-06-19 DIAGNOSIS — Z720 Tobacco use: Secondary | ICD-10-CM | POA: Diagnosis not present

## 2018-06-19 DIAGNOSIS — I25110 Atherosclerotic heart disease of native coronary artery with unstable angina pectoris: Secondary | ICD-10-CM | POA: Diagnosis not present

## 2018-06-19 DIAGNOSIS — E785 Hyperlipidemia, unspecified: Secondary | ICD-10-CM | POA: Diagnosis not present

## 2018-06-19 HISTORY — PX: CORONARY PRESSURE/FFR STUDY: CATH118243

## 2018-06-19 HISTORY — PX: LEFT HEART CATH AND CORONARY ANGIOGRAPHY: CATH118249

## 2018-06-19 LAB — POCT ACTIVATED CLOTTING TIME: Activated Clotting Time: 285 seconds

## 2018-06-19 SURGERY — LEFT HEART CATH AND CORONARY ANGIOGRAPHY
Anesthesia: Moderate Sedation

## 2018-06-19 MED ORDER — MIDAZOLAM HCL 2 MG/2ML IJ SOLN
INTRAMUSCULAR | Status: DC | PRN
  Administered 2018-06-19: 1 mg via INTRAVENOUS

## 2018-06-19 MED ORDER — IOPAMIDOL (ISOVUE-300) INJECTION 61%
INTRAVENOUS | Status: DC | PRN
  Administered 2018-06-19: 55 mL via INTRA_ARTERIAL

## 2018-06-19 MED ORDER — NITROGLYCERIN 1 MG/10 ML FOR IR/CATH LAB
INTRA_ARTERIAL | Status: DC | PRN
  Administered 2018-06-19: 200 ug via INTRACORONARY

## 2018-06-19 MED ORDER — HEPARIN (PORCINE) IN NACL 1000-0.9 UT/500ML-% IV SOLN
INTRAVENOUS | Status: AC
  Filled 2018-06-19: qty 1000

## 2018-06-19 MED ORDER — VERAPAMIL HCL 2.5 MG/ML IV SOLN
INTRAVENOUS | Status: AC
  Filled 2018-06-19: qty 2

## 2018-06-19 MED ORDER — SODIUM CHLORIDE 0.9 % IV SOLN
INTRAVENOUS | Status: DC
  Administered 2018-06-19: 16:00:00 via INTRAVENOUS

## 2018-06-19 MED ORDER — SODIUM CHLORIDE 0.9% FLUSH: 3.0000 mL | INTRAVENOUS | Status: DC | PRN

## 2018-06-19 MED ORDER — FENTANYL CITRATE (PF) 100 MCG/2ML IJ SOLN
INTRAMUSCULAR | Status: AC
  Filled 2018-06-19: qty 2

## 2018-06-19 MED ORDER — ISOSORBIDE MONONITRATE ER 30 MG PO TB24
15.0000 mg | ORAL_TABLET | Freq: Every day | ORAL | Status: DC
  Administered 2018-06-19: 15 mg via ORAL
  Filled 2018-06-19: qty 1

## 2018-06-19 MED ORDER — ENOXAPARIN SODIUM 40 MG/0.4ML ~~LOC~~ SOLN: 40.0000 mg | SUBCUTANEOUS | Status: DC

## 2018-06-19 MED ORDER — SODIUM CHLORIDE 0.9% FLUSH
3.0000 mL | Freq: Two times a day (BID) | INTRAVENOUS | Status: DC
  Administered 2018-06-19: 3 mL via INTRAVENOUS

## 2018-06-19 MED ORDER — HYDRALAZINE HCL 20 MG/ML IJ SOLN: 5.0000 mg | INTRAMUSCULAR | Status: DC | PRN

## 2018-06-19 MED ORDER — SODIUM CHLORIDE 0.9 % IV SOLN: 250.0000 mL | INTRAVENOUS | Status: DC | PRN

## 2018-06-19 MED ORDER — MIDAZOLAM HCL 2 MG/2ML IJ SOLN
INTRAMUSCULAR | Status: AC
  Filled 2018-06-19: qty 2

## 2018-06-19 MED ORDER — HEPARIN SODIUM (PORCINE) 1000 UNIT/ML IJ SOLN
INTRAMUSCULAR | Status: AC
  Filled 2018-06-19: qty 1

## 2018-06-19 MED ORDER — ATORVASTATIN CALCIUM 80 MG PO TABS: 80.0000 mg | ORAL_TABLET | Freq: Every day | ORAL | 3 refills | Status: DC

## 2018-06-19 MED ORDER — FENTANYL CITRATE (PF) 100 MCG/2ML IJ SOLN
INTRAMUSCULAR | Status: DC | PRN
  Administered 2018-06-19 (×2): 25 ug via INTRAVENOUS

## 2018-06-19 MED ORDER — HEPARIN SODIUM (PORCINE) 1000 UNIT/ML IJ SOLN
INTRAMUSCULAR | Status: DC | PRN
  Administered 2018-06-19: 3500 [IU] via INTRAVENOUS

## 2018-06-19 MED ORDER — ISOSORBIDE MONONITRATE ER 30 MG PO TB24: 15.0000 mg | ORAL_TABLET | Freq: Every day | ORAL | 0 refills | Status: DC

## 2018-06-19 MED ORDER — LABETALOL HCL 5 MG/ML IV SOLN: 10.0000 mg | INTRAVENOUS | Status: DC | PRN

## 2018-06-19 MED ORDER — NITROGLYCERIN 5 MG/ML IV SOLN
INTRAVENOUS | Status: AC
  Filled 2018-06-19: qty 10

## 2018-06-19 SURGICAL SUPPLY — 10 items
CATH INFINITI 5 FR JL3.5 (CATHETERS) ×2 IMPLANT
CATH INFINITI JR4 5F (CATHETERS) ×2 IMPLANT
CATH LAUNCHER 6FR EBU 3 (CATHETERS) ×2 IMPLANT
DEVICE INFLAT 30 PLUS (MISCELLANEOUS) ×2 IMPLANT
DEVICE RAD TR BAND REGULAR (VASCULAR PRODUCTS) ×2 IMPLANT
GLIDESHEATH SLEND SS 6F .021 (SHEATH) ×2 IMPLANT
KIT MANI 3VAL PERCEP (MISCELLANEOUS) ×2 IMPLANT
PACK CARDIAC CATH (CUSTOM PROCEDURE TRAY) ×2 IMPLANT
WIRE PRESSURE VERRATA (WIRE) ×2 IMPLANT
WIRE ROSEN-J .035X260CM (WIRE) ×2 IMPLANT

## 2018-06-19 NOTE — Plan of Care (Signed)
  Problem: Activity: Goal: Risk for activity intolerance will decrease Outcome: Progressing Note:  Up independently in room, daughter at bedside, tolerating well   Problem: Coping: Goal: Level of anxiety will decrease Outcome: Progressing   Problem: Elimination: Goal: Will not experience complications related to urinary retention Outcome: Progressing   Problem: Pain Managment: Goal: General experience of comfort will improve Outcome: Progressing Note:  No complaints of pain this shift   Problem: Safety: Goal: Ability to remain free from injury will improve Outcome: Progressing   Problem: Education: Goal: Knowledge of General Education information will improve Description Including pain rating scale, medication(s)/side effects and non-pharmacologic comfort measures Outcome: Completed/Met   Problem: Nutrition: Goal: Adequate nutrition will be maintained Outcome: Completed/Met

## 2018-06-19 NOTE — Progress Notes (Signed)
Physical Therapy Evaluation Patient Details Name: Lindsey Bautista MRN: [id number] DOB: 05/02/1961 Today's Date: 06/19/2018   History of Present Illness  Pt is 57 y/o female presenting with chest pain and admitted for unstable angina. PMH includes atypical chest pain, CVA, anxiety, depression, conversion disorder, HLD, tobacco use, and noncompliance. Pt has hx of recent hospitalization for R-sided weakness with negative MRI. Pt continues to have R-sided weakness. Imaging this hospitalization showed no acute cardiopulmonary disease. Pt had cardiac cath procedure performed this date, mild-moderate non-obstructive CAD.  Clinical Impression  Pt is a pleasant 57 year old female who was admitted for unstable angina. Pt performs bed mobility and transfers independently, and ambulation with CGA. CGA was only given d/t not wearing glasses this date. Pt demonstrates good safety awareness with all mobility. Has difficulty maintain SLS on RLE.  PT educated pt and spouse on RUE NWB for next 48 hours and gradually increasing weight bearing in following week. Pt also educated on HEP including frequency and duration for improved strength. Pt demonstrates deficits with strength on R-side, and balance in SLS on RLE. This entire session was guided, instructed, and directly supervised by Collie Siad, DPT.     Follow Up Recommendations Outpatient PT    Equipment Recommendations  None recommended by PT    Recommendations for Other Services       Precautions / Restrictions Restrictions Weight Bearing Restrictions: Yes RUE Weight Bearing: Non weight bearing Other Position/Activity Restrictions: RUE NWB 48 hrs s/p radial cath. with progressively increasing WBing over the next week.      Mobility  Bed Mobility Overal bed mobility: Independent             General bed mobility comments: Performs safe bed mobility without cues, while maintaining RUE NWB precautions.  Transfers Overall transfer level:  Independent Equipment used: None             General transfer comment: Performs safe transfers without cues, while maintaining RUE NWB precautions.  Ambulation/Gait Ambulation/Gait assistance: Min guard Gait Distance (Feet): 200 Feet Assistive device: None Gait Pattern/deviations: Step-through pattern Gait velocity: 10 ft in 4.2 seconds (2.38 ft/sec) Gait velocity interpretation: 1.31 - 2.62 ft/sec, indicative of limited community ambulator General Gait Details: Pt has normal reciprocal gait pattern with good bilateral heel strike, upright posturing.  Stairs            Wheelchair Mobility    Modified Rankin (Stroke Patients Only)       Balance Overall balance assessment: Needs assistance;History of Falls Sitting-balance support: Feet supported;No upper extremity supported Sitting balance-Leahy Scale: Normal Sitting balance - Comments: Tolerates mod-max weight shift in all directions without LOB   Standing balance support: No upper extremity supported Standing balance-Leahy Scale: Good Standing balance comment: Tolerates moderate weight shift in all directions without LOB Single Leg Stance - Right Leg: 2.8 Single Leg Stance - Left Leg: 12     Rhomberg - Eyes Opened: 30 Rhomberg - Eyes Closed: 30     Standardized Balance Assessment Standardized Balance Assessment : Dynamic Gait Index   Dynamic Gait Index Level Surface: Normal Change in Gait Speed: Normal Gait with Horizontal Head Turns: Normal Gait with Vertical Head Turns: Normal Gait and Pivot Turn: Normal Step Around Obstacles: Normal       Pertinent Vitals/Pain Pain Assessment: No/denies pain    Home Living Family/patient expects to be discharged to:: Private residence Living Arrangements: Spouse/significant other Available Help at Discharge: Family;Available PRN/intermittently(Husband works part-time) Type of Home: [place]  Access: Stairs to enter Entrance Stairs-Rails: Right;Left;Can  reach both Entrance Stairs-Number of Steps: 4 Home Layout: One level Home Equipment: None      Prior Function Level of Independence: Independent         Comments: Per pt is independent with all ADL, IADL, and ambulates without AD. Reports her husband drives her in the community. Pt reports recent fall (~5 days ago) when RLE gave out during ambulation.     Hand Dominance   Dominant Hand: Right    Extremity/Trunk Assessment   Upper Extremity Assessment Upper Extremity Assessment: Generalized weakness(RUE: 3/5, LUE 4/5)    Lower Extremity Assessment Lower Extremity Assessment: Generalized weakness(RLE: 4-/5, LLE: 5/5 )       Communication   Communication: No difficulties  Cognition Arousal/Alertness: Awake/alert Behavior During Therapy: WFL for tasks assessed/performed Overall Cognitive Status: Within Functional Limits for tasks assessed                                        General Comments      Exercises Other Exercises Other Exercises: BLE AROM supine: hip abd/add, SLRs, heel slides, and glute sets. All ther-ex performed 10 reps with VC for technique. Other Exercises: Pt/family education on RUE NWB status   Assessment/Plan    PT Assessment Patient needs continued PT services  PT Problem List Decreased strength;Decreased balance       PT Treatment Interventions Gait training;Stair training;Functional mobility training;Therapeutic exercise;Balance training;Therapeutic activities;Patient/family education    PT Goals (Current goals can be found in the Care Plan section)  Acute Rehab PT Goals Patient Stated Goal: To go home. PT Goal Formulation: With patient Time For Goal Achievement: 07/03/18 Potential to Achieve Goals: Good Additional Goals Additional Goal #1: Pt will perform SLS on RLE for 15 sec demonstrating improved balance for functional activities in the home and reduced fall risk.    Frequency Min 2X/week   Barriers to discharge         Co-evaluation               AM-PAC PT "6 Clicks" Daily Activity  Outcome Measure Difficulty turning over in bed (including adjusting bedclothes, sheets and blankets)?: None Difficulty moving from lying on back to sitting on the side of the bed? : None Difficulty sitting down on and standing up from a chair with arms (e.g., wheelchair, bedside commode, etc,.)?: None Help needed moving to and from a bed to chair (including a wheelchair)?: None Help needed walking in hospital room?: A Little Help needed climbing 3-5 steps with a railing? : A Little 6 Click Score: 22    End of Session Equipment Utilized During Treatment: Gait belt Activity Tolerance: Patient tolerated treatment well Patient left: in bed;with call bell/phone within reach Nurse Communication: Mobility status PT Visit Diagnosis: Muscle weakness (generalized) (M62.81)(R-sided)    Time: 1027-2536 PT Time Calculation (min) (ACUTE ONLY): 24 min   Charges:   PT Evaluation $PT Eval Low Complexity: 1 Low PT Treatments $Therapeutic Activity: 8-22 mins       Algis Downs, SPT  06/19/2018, 4:34 PM

## 2018-06-19 NOTE — Care Management Note (Signed)
Case Management Note  Patient Details  Name: Lindsey Bautista MRN: [id number] Date of Birth: 05-02-1961  Subjective/Objective:    Patient discharging to home with husband post cardiac cath.  Adding Imdur to medications.  Lipitor and Imdur on the $4 list at [facility] which is where patient gets her prescriptions filled.  Out patient PT recommended; however patient is declining at this time.  Independent in all adls, denies issues accessing medical care, obtaining medications or with transportation.  Current with PCP.  Will follow up with Dr. Rockey Situ and PCP.  Has plenty of family support.   No discharge needs identified at present by care manager or members of care team                Action/Plan:   Expected Discharge Date:  06/19/18               Expected Discharge Plan:  Home/Self Care  In-House Referral:     Discharge planning Services  CM Consult  Post Acute Care Choice:    Choice offered to:     DME Arranged:    DME Agency:     HH Arranged:    [place] Agency:     Status of Service:  Completed, signed off  If discussed at [employer] of Stay Meetings, dates discussed:    Additional Comments:  Elza Rafter, RN 06/19/2018, 5:10 PM

## 2018-06-19 NOTE — Discharge Summary (Signed)
Lindsey Bautista, is a 57 y.o. female  DOB 11/25/1960  MRN [id number].  Admission date:  06/17/2018  Admitting Physician  Hillary Bow, MD  Discharge Date:  06/19/2018   Primary MD  [name], MD  Recommendations for primary care physician for things to follow:   With PCP in 1 week   Admission Diagnosis  Unstable angina (HCC) [I20.0]   Discharge Diagnosis  Unstable angina ([place]) [I20.0]    Active Problems:   Chest pain   Unstable angina [facility])      Past Medical History:  Diagnosis Date  . Atypical chest pain    a. 08/2016 Neg MV.  [name] COPD (chronic obstructive pulmonary disease) ([place])   . Depression   . Diastolic dysfunction    a. 10/2016 Echo: EF 60-65%, no rwma, Gr1 DD, mild MR, nl LA size, nl RV fxn.  [name] GERD (gastroesophageal reflux disease)   . Hypotension   . IBS (irritable bowel syndrome)   . Right arm weakness   . Situational syncope   . TIA (transient ischemic attack)     Past Surgical History:  Procedure Laterality Date  . COLONOSCOPY WITH PROPOFOL N/A 11/22/2015   Procedure: COLONOSCOPY WITH PROPOFOL;  Surgeon: Manya Silvas, MD;  Location: [facility] ENDOSCOPY;  Service: Endoscopy;  Laterality: N/A;  . ESOPHAGOGASTRODUODENOSCOPY (EGD) WITH PROPOFOL N/A 11/22/2015   Procedure: ESOPHAGOGASTRODUODENOSCOPY (EGD) WITH PROPOFOL;  Surgeon: Manya Silvas, MD;  Location: [facility] ENDOSCOPY;  Service: Endoscopy;  Laterality: N/A;  . OOPHORECTOMY    . TONSILLECTOMY    . VAGINAL HYSTERECTOMY         History of present illness and  Hospital Course:     Kindly see H&P for history of present illness and admission details, please review complete Labs, Consult reports and Test reports for all details in brief  HPI  from the history and physical done on the day of admission 52-year-old woman with intermittent  atypical chest pain admitted for evaluation of unstable angina.  Patient troponins have been negative x3. Hospital Course   #1 recurrent chest pains, patient troponins have been negative, seen by [facility] cardiology patient received aspirin, statins, patient received prednisone due to her contrast neurologist, we premedicated her with prednisone.  Patient had a cardiac cath this morning,, Cath showed nonobstructive CAD, mild to moderate nonobstructive coronary artery disease, 50% mid LAD stenosis that is not hemodynamically significant.  Rectal management advised by cardiology, recommend adding isosorbide mononitrate 50 mg daily, aspirin, intensive statin, smoking cessation.  And discharged home today. Chest pain likely due to her anxiety, patient advised to see her PCP regarding her anxiety, continue Klonopin at this time.  Patient also takes Prozac 20 mg daily.  Discharge Condition: stable   Follow UP  Follow-up Information    Crissman, Jeannette How, MD. Schedule an appointment as soon as possible for a visit in 1 week(s).   Specialty:  Family Medicine Contact information: [place] [address] [phone]        Minna Merritts, MD .   Specialty:  Cardiology Contact information: [place] [address] [phone]             Discharge Instructions  and  Discharge Medications      Allergies as of 06/19/2018      Reactions   Penicillin G Anaphylaxis, Other (See Comments)   Has patient had a PCN reaction causing immediate rash, facial/tongue/throat swelling, SOB or lightheadedness  with hypotension: [id number]} Has patient had a PCN reaction causing severe rash involving mucus membranes or skin necrosis: no:[id number]} Has patient had a PCN reaction that required hospitalization no:[id number]} Has patient had a PCN reaction occurring within the last 10 years: [id number]} If all of the above answers are "NO", then may proceed  with Cephalosporin use.   Iodinated Diagnostic Agents Rash, Nausea And Vomiting      Medication List    TAKE these medications   aspirin 81 MG tablet Take 81 mg by mouth daily.   atorvastatin 80 MG tablet Commonly known as:  LIPITOR Take 1 tablet (80 mg total) by mouth daily.   clonazePAM 1 MG tablet Commonly known as:  KLONOPIN TAKE 1 TABLET BY MOUTH TWICE DAILY   estradiol 1 MG tablet Commonly known as:  ESTRACE TAKE 1 TABLET BY MOUTH ONCE DAILY   FLUoxetine 20 MG capsule Commonly known as:  PROZAC TAKE 1 CAPSULE BY MOUTH ONCE DAILY         Diet and Activity recommendation: See Discharge Instructions above   Consults obtained -cardiology  Major procedures and Radiology Reports - PLEASE review detailed and final reports for all details, in brief -      Dg Chest 1 View  Result Date: 06/12/2018 CLINICAL DATA:  57 year old female found down after fall. EXAM: CHEST  1 VIEW COMPARISON:  Chest CT 06/04/2018 and earlier. FINDINGS: Portable AP upright view at 1407 hours. Stable lung volumes and mediastinal contours are within normal limits. Allowing for portable technique the lungs are clear. Visualized tracheal air column is within normal limits. Negative visible bowel gas pattern. No acute osseous abnormality identified. IMPRESSION: No acute cardiopulmonary abnormality or acute traumatic injury identified. Electronically Signed   By: Genevie Ann M.D.   On: 06/12/2018 14:23   Dg Chest 2 View  Result Date: 06/17/2018 CLINICAL DATA:  Chest pain. EXAM: CHEST - 2 VIEW COMPARISON:  Radiograph of June 12, 2018 FINDINGS: The heart size and mediastinal contours are within normal limits. Both lungs are clear. No pneumothorax or pleural effusion is noted. The visualized skeletal structures are unremarkable. IMPRESSION: No active cardiopulmonary disease. Electronically Signed   By: Marijo Conception, M.D.   On: 06/17/2018 14:55   Ct Head Wo Contrast  Result Date: 06/12/2018 CLINICAL  DATA:  Fall EXAM: CT HEAD WITHOUT CONTRAST TECHNIQUE: Contiguous axial images were obtained from the base of the skull through the vertex without intravenous contrast. COMPARISON:  CT 05/15/2018, MRI 03/12/2017 FINDINGS: Brain: No acute territorial infarction, hemorrhage or intracranial mass. Stable ventricle size. Minimal small vessel ischemic changes of the white matter. Vascular: No hyperdense vessels.  Carotid vascular calcification. Skull: Normal. Negative for fracture or focal lesion. Sinuses/Orbits: No acute finding. Other: None IMPRESSION: 1. No CT evidence for acute intracranial abnormality. 2. Minimal small vessel ischemic changes of the white matter. Electronically Signed   By: Donavan Foil M.D.   On: 06/12/2018 14:46   Mr Brain Wo Contrast  Result Date: 06/13/2018 CLINICAL DATA:  [place] today, weakness. Found down. Headache and blurry vision since this morning. History of TIA. EXAM: MRI HEAD WITHOUT CONTRAST TECHNIQUE: Multiplanar, multiecho pulse sequences of the brain and surrounding structures were obtained without intravenous contrast. COMPARISON:  CT HEAD June 12, 2018 and MRI of the head March 12, 2017. FINDINGS: INTRACRANIAL CONTENTS: No reduced diffusion to suggest acute ischemia or hyperacute demyelination. No susceptibility artifact to suggest hemorrhage. A few scattered subcentimeter supratentorial white matter FLAIR T2 hyperintensities are similar. The ventricles  and sulci are normal for patient's age. No suspicious parenchymal signal, masses, mass effect. No abnormal extra-axial fluid collections. No extra-axial masses. VASCULAR: Normal major intracranial vascular flow voids present at skull base. SKULL AND UPPER CERVICAL SPINE: No abnormal sellar expansion. No suspicious calvarial bone marrow signal. Craniocervical junction maintained. SINUSES/ORBITS: The mastoid air-cells and included paranasal sinuses are well-aerated.The included ocular globes and orbital contents are  non-suspicious. OTHER: None. IMPRESSION: Negative noncontrast MRI head. Electronically Signed   By: Elon Alas M.D.   On: 06/13/2018 01:14   US Carotid Bilateral (at [facility] Only)  Result Date: 06/13/2018 CLINICAL DATA:  Right-sided weakness, syncope and visual disturbance. History of hypertension, TIA, hyperlipidemia and smoking. EXAM: BILATERAL CAROTID DUPLEX ULTRASOUND TECHNIQUE: Pearline Cables scale imaging, color Doppler and duplex ultrasound were performed of bilateral carotid and vertebral arteries in the neck. COMPARISON:  11/17/2016 FINDINGS: Criteria: Quantification of carotid stenosis is based on velocity parameters that correlate the residual internal carotid diameter with NASCET-based stenosis levels, using the diameter of the distal internal carotid lumen as the denominator for stenosis measurement. The following velocity measurements were obtained: RIGHT ICA:  91/30 cm/sec CCA:  87/86 cm/sec SYSTOLIC ICA/CCA RATIO:  1.1 ECA:  132 cm/sec LEFT ICA:  105/40 cm/sec CCA:  767/20 cm/sec SYSTOLIC ICA/CCA RATIO:  0.9 ECA:  115 cm/sec RIGHT CAROTID ARTERY: Stable intimal thickening. Minimal plaque at the level of the carotid bulb. No evidence of focal plaque or stenosis in the right internal carotid artery. RIGHT VERTEBRAL ARTERY: Antegrade flow with normal waveform and velocity. LEFT CAROTID ARTERY: Stable intimal thickening. Mild amount of predominately noncalcified plaque in the carotid bulb extending just up to the left ICA origin. Velocities and waveforms are normal. There is no evidence of significant carotid stenosis. Estimated left ICA stenosis is less than 50%. LEFT VERTEBRAL ARTERY: Antegrade flow with normal waveform and velocity. IMPRESSION: 1. Mild plaque in the left carotid bulb extending up to the left ICA origin. Estimated left ICA stenosis is less than 50%. 2. Minimal plaque in the right carotid bulb. No evidence of right ICA plaque or stenosis. Electronically Signed   By: Aletta Edouard M.D.   On: 06/13/2018 10:37   Ct Chest Lung Cancer Screening Low Dose Wo Contrast  Result Date: 06/04/2018 CLINICAL DATA:  57 year old female current smoker with 39 pack year history of smoking. Lung cancer screening examination. EXAM: CT CHEST WITHOUT CONTRAST LOW-DOSE FOR LUNG CANCER SCREENING TECHNIQUE: Multidetector CT imaging of the chest was performed following the standard protocol without IV contrast. COMPARISON:  No priors. FINDINGS: Cardiovascular: Heart size is normal. There is no significant pericardial fluid, thickening or pericardial calcification. There is aortic atherosclerosis, as well as atherosclerosis of the great vessels of the mediastinum and the coronary arteries, including calcified atherosclerotic plaque in the right coronary artery. Mediastinum/Nodes: No pathologically enlarged mediastinal or hilar lymph nodes. Please note that accurate exclusion of hilar adenopathy is limited on noncontrast CT scans. Esophagus is unremarkable in appearance. No axillary lymphadenopathy. Lungs/Pleura: Small right upper lobe pulmonary nodule immediately anterior to the major fissure (axial image 53 of series 3), with a volume derived mean diameter of 3.6 mm. No larger more suspicious appearing pulmonary nodules or masses are noted. No acute consolidative airspace disease. No pleural effusions. Mild diffuse bronchial wall thickening with mild centrilobular and paraseptal emphysema. Upper Abdomen: Aortic atherosclerosis. 1.9 x 1.3 cm low-attenuation (-13 HU) right adrenal nodule, compatible with an adenoma. Musculoskeletal: There are no aggressive appearing lytic or blastic lesions  noted in the visualized portions of the skeleton. IMPRESSION: 1. Lung-RADS 2S, benign appearance or behavior. Continue annual screening with low-dose chest CT without contrast in 12 months. 2. The "S" modifier above refers to potentially clinically significant non lung cancer related findings. Specifically, there is  aortic atherosclerosis, in addition to right coronary artery disease. Please note that although the presence of coronary artery calcium documents the presence of coronary artery disease, the severity of this disease and any potential stenosis cannot be assessed on this non-gated CT examination. Assessment for potential risk factor modification, dietary therapy or pharmacologic therapy may be warranted, if clinically indicated. 3. Mild diffuse bronchial wall thickening with mild centrilobular and paraseptal emphysema; imaging findings suggestive of underlying COPD. 4. 1.9 x 1.3 cm right adrenal adenoma. Aortic Atherosclerosis (ICD10-I70.0) and Emphysema (ICD10-J43.9). Electronically Signed   By: Vinnie Langton M.D.   On: 06/04/2018 13:53    Micro Results     Recent Results (from the past 240 hour(s))  Urine culture     Status: None   Collection Time: 06/12/18 11:37 PM  Result Value Ref Range Status   Specimen Description   Final    URINE, RANDOM Performed at [facility], [address], [place], [place] [address]    Special Requests   Final    NONE Performed at [facility], [address], [place], [place] [address]    Culture   Final    NO GROWTH Performed at [facility], [place] [address] [place], [place] [address]    Report Status 06/14/2018 FINAL  Final       Today   Subjective:   Lindsey Bautista today has no headache,no chest abdominal pain,no new weakness tingling or numbness, feels much better wants to go home today.   Objective:   Blood pressure 117/61, pulse 76, temperature 98.5 F (36.9 C), temperature source Oral, resp. rate (!) 21, height 5\' 1"  (1.549 m), weight 65.1 kg, SpO2 95 %.   Intake/Output Summary (Last 24 hours) at 06/19/2018 1417 Last data filed at 06/19/2018 0700 Gross per 24 hour  Intake 319.94 ml  Output 550 ml  Net -230.06 ml    Exam Awake Alert, Oriented x 3, No new F.N deficits, Normal affect [place].AT,PERRAL Supple  Neck,No JVD, No cervical lymphadenopathy appriciated.  Symmetrical Chest wall movement, Good air movement bilaterally, CTAB RRR,No Gallops,Rubs or new Murmurs, No Parasternal Heave +ve B.Sounds, Abd Soft, Non tender, No organomegaly appriciated, No rebound -guarding or rigidity. No Cyanosis, Clubbing or edema, No new Rash or bruise  Data Review   CBC w Diff:  Lab Results  Component Value Date   WBC 5.8 06/17/2018   HGB 14.5 06/17/2018   HGB 15.0 08/02/2015   HCT 42.8 06/17/2018   HCT 43.1 08/02/2015   PLT 294 06/17/2018   PLT 303 08/02/2015   LYMPHOPCT 41 06/12/2018   LYMPHOPCT 48.0 04/27/2014   MONOPCT 10 06/12/2018   MONOPCT 7.2 04/27/2014   EOSPCT 1 06/12/2018   EOSPCT 0.6 04/27/2014   BASOPCT 1 06/12/2018   BASOPCT 1.0 04/27/2014    CMP:  Lab Results  Component Value Date   NA 139 06/17/2018   NA 138 08/02/2015   NA 144 04/27/2014   K 3.5 06/17/2018   K 3.8 04/27/2014   CL 102 06/17/2018   CL 106 04/27/2014   CO2 28 06/17/2018   CO2 27 04/27/2014   BUN 9 06/17/2018   BUN 9 08/02/2015   BUN 7 04/27/2014  CREATININE 0.84 06/17/2018   CREATININE 0.81 04/27/2014   PROT 7.2 03/11/2018   PROT 6.9 06/21/2015   PROT 8.0 11/27/2013   ALBUMIN 4.0 03/11/2018   ALBUMIN 4.3 06/21/2015   ALBUMIN 3.9 11/27/2013   BILITOT 0.9 03/11/2018   BILITOT 0.4 06/21/2015   BILITOT 0.4 11/27/2013   ALKPHOS 74 03/11/2018   ALKPHOS 73 11/27/2013   AST 21 03/11/2018   AST 25 08/31/2016   AST 29 11/27/2013   ALT 12 03/11/2018   ALT 18 08/31/2016   ALT 23 11/27/2013  .   Total Time in preparing paper work, data evaluation and todays exam - 35 minutes  Epifanio Lesches M.D on 06/19/2018 at 2:17 PM    Note: This dictation was prepared with Dragon dictation along with smaller phrase technology. Any transcriptional errors that result from this process are unintentional.

## 2018-06-19 NOTE — Progress Notes (Signed)
Pt ambulated around nurses station and tolerated well, no complains of chest pain.

## 2018-06-19 NOTE — Progress Notes (Signed)
PT Cancellation Note  Patient Details Name: Lindsey Bautista MRN: [id number] DOB: Apr 03, 1961   Cancelled Treatment:    Reason Eval/Treat Not Completed: Patient at procedure or test/unavailable.  Pt currently off floor for cardiac cath.  Will attempt to see pt again later today, schedule permitting.    Collie Siad PT, DPT 06/19/2018, 1:03 PM

## 2018-06-19 NOTE — Brief Op Note (Signed)
BRIEF CARDIAC CATHETERIZATION NOTE  DATE: 06/19/2018 TIME: 12:12 PM  PATIENT:  Lindsey Bautista  57 y.o. female  PRE-OPERATIVE DIAGNOSIS:  Unstable angina  POST-OPERATIVE DIAGNOSIS:  Non-obstructive coronary artery disease  PROCEDURE:  Procedure(s): LEFT HEART CATH AND CORONARY ANGIOGRAPHY (N/A)  SURGEON:  Surgeon(s) and Role:    * Enrika Aguado, MD - Primary  FINDINGS: 1. Mild to moderate, non-obstructive coronary artery disease.  Most significant lesion is 50% mid LAD stenosis that is not hemodynamically significant (iFR 0.94). 2. Normal LVEF with mildly elevated LVEDP.  RECOMMENDATIONS: 1. Medical therapy; will add isosorbide mononitrate 15 mg daily. 2. ASA, high-intensity statin therapy, and smoking cessation. 3. Anticipated d/c home this afternoon and outpatient f/u with Dr. Rockey Situ.  Nelva Bush, MD [facility] Pager: [phone]

## 2018-06-19 NOTE — Interval H&P Note (Signed)
History and Physical Interval Note:  06/19/2018 11:15 AM  Lindsey Bautista  has presented today for cardiac catheterization, with the diagnosis of unstable angina.  The various methods of treatment have been discussed with the patient and family. After consideration of risks, benefits and other options for treatment, the patient has consented to  Procedure(s): LEFT HEART CATH AND CORONARY ANGIOGRAPHY (N/A) as a surgical intervention .  The patient's history has been reviewed, patient examined, no change in status, stable for surgery.  I have reviewed the patient's chart and labs.  Questions were answered to the patient's satisfaction.    Cath Lab Visit (complete for each Cath Lab visit)  Clinical Evaluation Leading to the Procedure:   ACS: Yes.    Non-ACS:  N/A  Lindsey Bautista

## 2018-06-19 NOTE — Progress Notes (Signed)
Lindsey Bautista to be D/C'd Home per MD order.  Discussed prescriptions and follow up appointments with the patient. Prescriptions given to patient, medication list explained in detail. Pt verbalized understanding.  Allergies as of 06/19/2018      Reactions   Penicillin G Anaphylaxis, Other (See Comments)   Has patient had a PCN reaction causing immediate rash, facial/tongue/throat swelling, SOB or lightheadedness with hypotension: [id number]} Has patient had a PCN reaction causing severe rash involving mucus membranes or skin necrosis: no:[id number]} Has patient had a PCN reaction that required hospitalization no:[id number]} Has patient had a PCN reaction occurring within the last 10 years: [id number]} If all of the above answers are "NO", then may proceed with Cephalosporin use.   Iodinated Diagnostic Agents Rash, Nausea And Vomiting      Medication List    TAKE these medications   aspirin 81 MG tablet Take 81 mg by mouth daily.   atorvastatin 80 MG tablet Commonly known as:  LIPITOR Take 1 tablet (80 mg total) by mouth daily at 6 PM. What changed:  when to take this   clonazePAM 1 MG tablet Commonly known as:  KLONOPIN TAKE 1 TABLET BY MOUTH TWICE DAILY   estradiol 1 MG tablet Commonly known as:  ESTRACE TAKE 1 TABLET BY MOUTH ONCE DAILY   FLUoxetine 20 MG capsule Commonly known as:  PROZAC TAKE 1 CAPSULE BY MOUTH ONCE DAILY   isosorbide mononitrate 30 MG 24 hr tablet Commonly known as:  IMDUR Take 0.5 tablets (15 mg total) by mouth daily.       Vitals:   06/19/18 1445 06/19/18 1509  BP:  (!) 103/53  Pulse: 78 77  Resp: 18 18  Temp:  98.3 F (36.8 C)  SpO2: 96% 96%    Tele box removed and returned.Skin clean, dry and intact without evidence of skin break down, no evidence of skin tears noted. IV catheter discontinued intact. Site without signs and symptoms of complications. Dressing and pressure applied. Pt denies pain at this time. No complaints noted.  An  After Visit Summary was printed and given to the patient. Patient escorted via [place], and D/C home via private auto.  Lindsey Bautista

## 2018-06-19 NOTE — Progress Notes (Signed)
Progress Note  Patient Name: Lindsey Bautista Date of Encounter: 06/19/2018  Primary Cardiologist: Ida Rogue, MD  Subjective   Lindsey Bautista is a 57 y.o. female scheduled for cardiac catheterization this AM.  She states that she is feeling better today and hasn't had any further chest pain since 10/28. She states she is currently pain free other than a headache. She does state that she is anxious about the procedure, but she is looking forward to having an answer. She denies chest pain, SOB, nausea, vomiting, abdominal distension, weakness, numbness, or swelling.   Inpatient Medications    Scheduled Meds: . aspirin EC  81 mg Oral Daily  . atorvastatin  80 mg Oral Daily  . clonazePAM  1 mg Oral BID  . diphenhydrAMINE  50 mg Oral Once   Or  . diphenhydrAMINE  50 mg Intravenous Once  . enoxaparin (LOVENOX) injection  40 mg Subcutaneous Q24H  . estradiol  1 mg Oral Daily  . FLUoxetine  20 mg Oral Daily  . predniSONE  50 mg Oral Q6H  . sodium chloride flush  3 mL Intravenous Q12H  . sodium chloride flush  3 mL Intravenous Q12H   Continuous Infusions: . sodium chloride    . sodium chloride 1 mL/kg/hr (06/19/18 0546)   PRN Meds: sodium chloride, acetaminophen **OR** acetaminophen, albuterol, ondansetron **OR** ondansetron (ZOFRAN) IV, polyethylene glycol, sodium chloride flush   Vital Signs    Vitals:   06/18/18 1300 06/18/18 1337 06/18/18 1957 06/19/18 0427  BP: 131/69 127/61 107/65 129/68  Pulse: (!) 58 (!) 59 66 60  Resp: 15 19 18 20   Temp:  98.7 F (37.1 C) 98.7 F (37.1 C) 98.4 F (36.9 C)  TempSrc:  Oral Oral Oral  SpO2: 96% 98% 98% 94%  Weight:  64.4 kg  65.1 kg  Height:  5\' 1"  (1.549 m)      Intake/Output Summary (Last 24 hours) at 06/19/2018 0726 Last data filed at 06/19/2018 0601 Gross per 24 hour  Intake 256.12 ml  Output 450 ml  Net -193.88 ml   Filed Weights   06/17/18 2017 06/18/18 1337 06/19/18 0427  Weight: 64.9 kg 64.4 kg 65.1 kg     Physical Exam   GEN: Resting calmly in bed, well nourished, well developed, in no acute distress.  HEENT: Grossly normal.  Neck: Supple, no JVD, or masses. Cardiac: RRR, no murmurs, rubs, or gallops. No clubbing, cyanosis, edema.  Radials/PT 2+ and equal bilaterally.  Respiratory:  Respirations regular and unlabored, clear to auscultation bilaterally. GI: Soft, nontender, nondistended, BS + x 4. MS: no deformity or atrophy, full ROM, patient moving independently in bed. Skin: warm and dry, no rash. Neuro:  Strength and sensation are grossly intact. Psych: AAOx3.  Normal affect.  Labs    Chemistry Recent Labs  Lab 06/12/18 1237 06/17/18 1533  NA 141 139  K 4.1 3.5  CL 106 102  CO2 30 28  GLUCOSE 82 139*  BUN 10 9  CREATININE 0.85 0.84  CALCIUM 8.7* 9.2  GFRNONAA >60 >60  GFRAA >60 >60  ANIONGAP 5 9     Hematology Recent Labs  Lab 06/12/18 1237 06/17/18 1424  WBC 3.8* 5.8  RBC 4.82 4.82  HGB 14.4 14.5  HCT 43.4 42.8  MCV 90.0 88.8  MCH 29.9 30.1  MCHC 33.2 33.9  RDW 12.8 12.8  PLT 284 294    Cardiac Enzymes Recent Labs  Lab 06/12/18 1237 06/17/18 1533 06/17/18 2258 06/18/18 0502  TROPONINI <0.03 <0.03 <0.03 <0.03   No results for input(s): TROPIPOC in the last 168 hours.   BNPNo results for input(s): BNP, PROBNP in the last 168 hours.   DDimer No results for input(s): DDIMER in the last 168 hours.   Radiology    Dg Chest 2 View  Result Date: 06/17/2018 CLINICAL DATA:  Chest pain. EXAM: CHEST - 2 VIEW COMPARISON:  Radiograph of June 12, 2018 FINDINGS: The heart size and mediastinal contours are within normal limits. Both lungs are clear. No pneumothorax or pleural effusion is noted. The visualized skeletal structures are unremarkable. IMPRESSION: No active cardiopulmonary disease. Electronically Signed   By: Marijo Conception, M.D.   On: 06/17/2018 14:55    Telemetry    NSR, rate 62 - Personally Reviewed  ECG    No new EKG, most recent  on 10/28  Cardiac Studies   Lab Results  Component Value Date   TROPONINI <0.03 06/18/2018   Echocardiogram 06/12/2018 Study Conclusions  - Left ventricle: The cavity size was normal. Systolic function was   normal. The estimated ejection fraction was in the range of 60%   to 65%. Wall motion was normal; there were no regional wall   motion abnormalities. Left ventricular diastolic function   parameters were normal. - Mitral valve: There was mild regurgitation. - Right ventricle: Systolic function was normal. - Atrial septum: No defect or patent foramen ovale was identified.   Echo contrast study showed no right-to-left atrial level shunt,   at baseline or with provocation. - Pulmonary arteries: Systolic pressure was within the normal   range.   Patient Profile     57 y.o. female with severe, recurrent atypical chest pain, HLD, chronic dyspnea on exertion, ongoing tobacco abuse at 1ppd currently (previously 2ppd since 58 y.o.), recurrent headaches, stroke symptoms in 2015, depression, anxiety, and conversion disorder. She presented on 10/27 to the cardiology office with severe, crushing chest pain. Cardiac workup in the emergency department was negative.   Assessment & Plan    1. Unstable Angina vs. Atypical Chest pain -Pt voiced understanding of procedure and plan. Going forward with cardiac catheterization today for definitive coronary assessment to evaluate chronic, recurrent chest pain. Pt reassured about the procedure and given emotional support.  -Given her contrast allergy pt has been premedicated with prednisone and diphenhydramine per protocol.  -Continue ASA and atorvastatin. -If catheterization does not reveal cardiac abnormalities she can be discharged today with follow-up in our office in 1-2 weeks for post-cath eval, and further recommendation to follow-up with primary care and behavioral health for further evaluation of chest pain.  2. Headache -Not severe at  this time, recurrent per pt. Manage per I.M.  Signed, Brenton Grills, Student-PA  06/19/2018, 7:26 AM    For questions or updates, please contact   Please consult [email or web address] for contact info under Cardiology/STEMI.   I have seen and examined the patient and agree with the findings and plan, as documented in the PA student's note, with the following additions/changes.  Ms. Mackins reports feeling well this morning without chest pain or shortness of breath.  She noted anxiety leading up to the catheterization, though she tolerated the procedure well (see details below).  Physical exam is notable for regular rate and rhythm without murmurs.  Lungs are clear.  There is no lower extremity edema or JVD.  Abdomen is soft and nontender.  Cardiac catheterization revealed mild to moderate, nonobstructive coronary artery disease.  Most significant stenosis involve the mid  LAD and was not hemodynamically significant (iFR 0.94).  She had transient chest pressure during the catheterization, which resolved with IV fentanyl.  In summary, Ms. Korell is a 57 year old woman with intermittent atypical chest pain, admitted with recurrent chest pain concerning for unstable angina.  She is currently chest pain-free.  Cardiac catheterization showed no severe disease to account for her symptoms.  Chest pain could be microvascular, though I suspect it is more likely to be related to underlying anxiety.  We will empirically add isosorbide mononitrate 15 mg daily.  Continued management of anxiety is recommended.  I think it is reasonable for Ms. Boback to be discharged home later today and to follow-up with Dr. Rockey Situ as an outpatient.  Nelva Bush, MD [facility] Pager: [phone]

## 2018-06-19 NOTE — Progress Notes (Signed)
Pt returns from cath, Radial site is clean, dry, and intact, no hematoma, no bleeding, no complains of pain at this time.

## 2018-06-19 NOTE — Care Management Obs Status (Signed)
[place] NOTIFICATION   Patient Details  Name: Lindsey Bautista MRN: [id number] Date of Birth: 1960/09/10   Medicare Observation Status Notification Given:  Yes    Elza Rafter, RN 06/19/2018, 10:01 AM

## 2018-06-19 NOTE — Progress Notes (Addendum)
Pt off the floor for cath. Report given to [place], [place]

## 2018-06-20 ENCOUNTER — Encounter: Payer: Self-pay | Admitting: Internal Medicine

## 2018-06-20 ENCOUNTER — Ambulatory Visit: Payer: Self-pay | Admitting: Family Medicine

## 2018-06-24 ENCOUNTER — Encounter: Payer: Self-pay | Admitting: Family Medicine

## 2018-06-24 ENCOUNTER — Ambulatory Visit (INDEPENDENT_AMBULATORY_CARE_PROVIDER_SITE_OTHER): Payer: Medicare Other | Admitting: Family Medicine

## 2018-06-24 DIAGNOSIS — I200 Unstable angina: Secondary | ICD-10-CM

## 2018-06-24 DIAGNOSIS — F447 Conversion disorder with mixed symptom presentation: Secondary | ICD-10-CM | POA: Diagnosis not present

## 2018-06-24 MED ORDER — QUETIAPINE FUMARATE 50 MG PO TABS: 50.0000 mg | ORAL_TABLET | Freq: Every day | ORAL | 1 refills | Status: DC

## 2018-06-24 NOTE — Progress Notes (Signed)
BP 134/72   Pulse 69   Temp 98.2 F (36.8 C) (Oral)   Wt 142 lb (64.4 kg)   SpO2 97%   BMI 26.83 kg/m    Subjective:    Patient ID: Lindsey Bautista, female    DOB: 1960/12/15, 57 y.o.   MRN: [id number]  HPI: Lindsey Bautista is a 57 y.o. female  Chief Complaint  Patient presents with  . Hospitalization Follow-up    Chest pain likely due to her anxiety, patient advised to see her PCP regarding her anxiety, continue Klonopin at this time.  Patient also takes Prozac 20 mg daily.  Patient follow-up from hospitalization: AND of course her CT and MRI of her brain were normal.  Patient also with essentially normal cardiac catheterization showing minimal atherosclerosis which is apparent also and carotid artery and aortic atherosclerosis.  Patient is taking Lipitor 80 mg 1 a day without problems. Patient's chronic anxiety being treated with fluoxetine 20 and clonazepam 1 mg twice a day. With patient's conversion disorder this is apparently inadequate.  Relevant past medical, surgical, family and social history reviewed and updated as indicated. Interim medical history since our last visit reviewed. Allergies and medications reviewed and updated.  Review of Systems  Constitutional: Negative.   Respiratory: Negative.   Cardiovascular: Negative.     Per HPI unless specifically indicated above     Objective:    BP 134/72   Pulse 69   Temp 98.2 F (36.8 C) (Oral)   Wt 142 lb (64.4 kg)   SpO2 97%   BMI 26.83 kg/m   Wt Readings from Last 3 Encounters:  06/24/18 142 lb (64.4 kg)  06/19/18 143 lb 8 oz (65.1 kg)  06/17/18 140 lb (63.5 kg)    Physical Exam  Constitutional: She is oriented to person, place, and time. She appears well-developed and well-nourished.  HENT:  Head: Normocephalic and atraumatic.  Eyes: Conjunctivae and EOM are normal.  Neck: Normal range of motion.  Cardiovascular: Normal rate, regular rhythm and normal heart sounds.  Pulmonary/Chest: Effort normal and  breath sounds normal.  Musculoskeletal: Normal range of motion.  Neurological: She is alert and oriented to person, place, and time.  Skin: No erythema.  Psychiatric: She has a normal mood and affect. Her behavior is normal. Judgment and thought content normal.    Results for orders placed or performed during the hospital encounter of 06/17/18  CBC  Result Value Ref Range   WBC 5.8 4.0 - 10.5 K/uL   RBC 4.82 3.87 - 5.11 MIL/uL   Hemoglobin 14.5 12.0 - 15.0 g/dL   HCT 42.8 36.0 - 46.0 %   MCV 88.8 80.0 - 100.0 fL   MCH 30.1 26.0 - 34.0 pg   MCHC 33.9 30.0 - 36.0 g/dL   RDW 12.8 11.5 - 15.5 %   Platelets 294 150 - 400 K/uL   nRBC 0.0 0.0 - 0.2 %  Basic metabolic panel  Result Value Ref Range   Sodium 139 135 - 145 mmol/L   Potassium 3.5 3.5 - 5.1 mmol/L   Chloride 102 98 - 111 mmol/L   CO2 28 22 - 32 mmol/L   Glucose, Bld 139 (H) 70 - 99 mg/dL   BUN 9 6 - 20 mg/dL   Creatinine, Ser 0.84 0.44 - 1.00 mg/dL   Calcium 9.2 8.9 - 10.3 mg/dL   GFR calc non Af Amer >60 >60 mL/min   GFR calc Af Amer >60 >60 mL/min   Anion gap  9 5 - 15  Troponin I  Result Value Ref Range   Troponin I <0.03 <0.03 ng/mL  Troponin I  Result Value Ref Range   Troponin I <0.03 <0.03 ng/mL  Troponin I  Result Value Ref Range   Troponin I <0.03 <0.03 ng/mL  Pregnancy, urine POC  Result Value Ref Range   Preg Test, Ur NEGATIVE NEGATIVE  POCT Activated clotting time  Result Value Ref Range   Activated Clotting Time 285 seconds      Assessment & Plan:   Problem List Items Addressed This Visit      Other   Conversion disorder with mixed symptoms    Discussed conversion disorder with patient and multiple negative work-ups with need for better treatment for her anxiety and psychiatric illnesses. We will try Seroquel as patient reluctant to go back to psychiatry. Will continue fluoxetine and clonazepam       From overview of conversion disorder: Conversion disorder presents with strokelike  symptoms syncope and collapse area and multiple negative workups Symptoms also include cardiovascular symptoms with multiple negative cardiovascular workups.  From note 12-04-2016 Patient for review of hospitalization for TIA type symptoms again. Reviewed patient's extensive medical history with extensive and recurrent similar workups that she had in March 2018. These w/us have all come back negative. The patient has been to multiple medical specialists regarding these type symptoms with all negative workups. This problem has been ongoing for 3+ years. The symptoms have not gotten worse or gotten better. The patient has suffered complications from the falls. Most recently from yesterday with her right hand and wrist banged up from falling on the steps. Patient has accommodated her lifestyle and is not left alone and does not drive. The patient has seen a psychiatrist with ultimate diagnosis of conversion disorder with mixed symptoms. No treatment has been offered, or trying multiple medications and different medications for various conditions with no difference in patient's outcome.     Follow up plan: Return in about 4 weeks (around 07/22/2018) for BMP,  Lipids, ALT, AST, Seroquel check.

## 2018-06-24 NOTE — Assessment & Plan Note (Addendum)
Discussed conversion disorder with patient and multiple negative work-ups with need for better treatment for her anxiety and psychiatric illnesses. We will try Seroquel as patient reluctant to go back to psychiatry. Will continue fluoxetine and clonazepam

## 2018-07-02 ENCOUNTER — Encounter: Payer: Self-pay | Admitting: Physician Assistant

## 2018-07-02 ENCOUNTER — Ambulatory Visit (INDEPENDENT_AMBULATORY_CARE_PROVIDER_SITE_OTHER): Payer: Medicare Other | Admitting: Physician Assistant

## 2018-07-02 VITALS — BP 110/60 | HR 58 | Ht 61.0 in | Wt 144.5 lb

## 2018-07-02 DIAGNOSIS — I700 Atherosclerosis of aorta: Secondary | ICD-10-CM | POA: Diagnosis not present

## 2018-07-02 DIAGNOSIS — F449 Dissociative and conversion disorder, unspecified: Secondary | ICD-10-CM

## 2018-07-02 DIAGNOSIS — E279 Disorder of adrenal gland, unspecified: Secondary | ICD-10-CM | POA: Diagnosis not present

## 2018-07-02 DIAGNOSIS — I200 Unstable angina: Secondary | ICD-10-CM

## 2018-07-02 DIAGNOSIS — I2510 Atherosclerotic heart disease of native coronary artery without angina pectoris: Secondary | ICD-10-CM

## 2018-07-02 DIAGNOSIS — R0789 Other chest pain: Secondary | ICD-10-CM

## 2018-07-02 DIAGNOSIS — F419 Anxiety disorder, unspecified: Secondary | ICD-10-CM

## 2018-07-02 DIAGNOSIS — E782 Mixed hyperlipidemia: Secondary | ICD-10-CM | POA: Diagnosis not present

## 2018-07-02 MED ORDER — ISOSORBIDE MONONITRATE ER 30 MG PO TB24: 15.0000 mg | ORAL_TABLET | Freq: Every day | ORAL | 3 refills | Status: DC

## 2018-07-02 NOTE — Patient Instructions (Signed)
Medication Instructions:  Your physician has recommended you make the following change in your medication:  1- START Imdur 15 mg (0.5 tablet) by mouth once a day.  If you need a refill on your cardiac medications before your next appointment, please call your pharmacy.   Lab work: Your physician recommends that you return for lab work in: [place] TO BE FASTING. - AROUND August 01, 2018. - Please go to the [facility]. You will check in at the front desk to the right as you walk into the atrium. Valet Parking is offered if needed.   If you have labs (blood work) drawn today and your tests are completely normal, you will receive your results only by: [name] MyChart Message (if you have MyChart) OR . A paper copy in the mail If you have any lab test that is abnormal or we need to change your treatment, we will call you to review the results.  Testing/Procedures: none  Follow-Up: At [facility], you and your health needs are our priority.  As part of our continuing mission to provide you with exceptional heart care, we have created designated Provider Care Teams.  These Care Teams include your primary Cardiologist (physician) and Advanced Practice Providers (APPs -  Physician Assistants and Nurse Practitioners) who all work together to provide you with the care you need, when you need it. You will need a follow up appointment in 6 months.  Please call our office 2 months in advance to schedule this appointment.  You may see Ida Rogue, MD or one of the following Advanced Practice Providers on your designated Care Team:   Murray Hodgkins, NP Christell Faith, PA-C . Marrianne Mood, PA-C

## 2018-07-02 NOTE — Progress Notes (Signed)
Cardiology Office Note Date:  07/02/2018  Patient ID:  Lindsey, Bautista 1960-11-27, MRN [id number] PCP:  [name], MD  Cardiologist:  Dr. Rockey Situ, MD    Chief Complaint: Hospital follow-up  History of Present Illness: Lindsey Bautista is a 57 y.o. female with history of mild to moderate nonobstructive CAD by [place] on 06/19/2018, atypical chest pain, chronic dyspnea on exertion with ongoing tobacco abuse at at least 1 pack daily, previously 2 packs daily since she was 57 years old, stroke symptoms in 2015, situational syncope, depression, anxiety, conversion disorder, HLD, and noncompliance who presents for follow-up of recent admission to [facility] from 10/28 through 10/30 for chest pain concerning for unstable angina.  Prior echo in the setting of stroke-like symptoms in 2015 showed an EF of 55-60%, normal wall motion, normal LV diastolic function, trace TR. Follow up echo in 10/2016 in the setting of possible TIA showed an EF of 60-65%, no RWMA, Gr1DD, mild MR, LA normal in size, RVSF normal, PASP normal. Patient underwent a Myoview in 08/2016 for constant, atypical chest pain that was negative for significant ischemia.  She was seen in the ED in the fall 2018 for recurrent, constant, atypical chest pain with reassurance provided.  Prior lung cancer screening chest CT on 06/04/2018 demonstrated aortic calcification as well as coronary artery calcifications.  Patient was recently admitted to [facility] from 10/23 through 10/24 for right-sided weakness.  Head CT was nonacute.  MRI of the brain was nonacute.  Carotid artery ultrasound showed less than 28% LICA stenosis with minimal R ICA stenosis.  Echocardiogram on 06/13/2018 showed an EF of 60 to 65%, no regional wall motion abnormality's, normal LV diastolic function, mild mitral regurgitation, RV systolic function normal, no ASD or PFO with contrast showing no atrial level shunt at baseline or with provocation, PASP normal.  Patient was noted to be back at  her baseline quickly.  Patient was seen as a working on 10/28 noting severe shortness of breath with substernal chest pain that radiated to the left side of her neck, jaw, and left shoulder.  Chest pain was noted to be 8 out of 10 and associated shortness of breath, diaphoresis, nausea.  At the time of her office visit she continued to note 6 out of 10 chest pain and was sent to the ED for further evaluation where she was subsequently admitted for rule out an ischemic evaluation.  During admission, patient's troponins were negative x3.  Chest x-ray was without cardiopulmonary disease.  EKG showed sinus bradycardia with poor R wave progression from V2 to V3 which may have reflected lead placement.  Patient preferred cardiac catheterization and following pretreatment for contrast allergy she subsequently underwent cardiac catheterization on 06/19/2018 that showed mild to moderate, nonobstructive coronary artery disease with the most significant lesion being a 50% mid LAD stenosis which was not hemodynamically significant with an iFR of 0.94.  She was noted to have normal LV systolic function with a mildly elevated LV filling pressure.  Medical therapy was recommended and she was started on Imdur 15 mg daily.  Labs: 05/2018 - LDL 151, A1c 4.9, potassium 3.5, serum creatinine 0.84, CBC unremarkable  02/2018 - LFT normal  She comes in doing well from a cardiac perspective today.  She does continue to note some intermittent chest discomfort though this is significantly improved following her cardiac catheterization as above.  She has not started Imdur 15 mg daily though she does have this at home.  Overall,  she feels significantly improved following her cardiac catheterization.  No shortness of breath, palpitations, dizziness, presyncope, or syncope.  She is tolerating aspirin and Lipitor without issues.  She has been started on Seroquel for conversion disorder by PCP which she has not yet started.  No issues from  cardiac cath site.  Past Medical History:  Diagnosis Date  . Atypical chest pain    a. 08/2016 Neg MV.  [name] COPD (chronic obstructive pulmonary disease) ([place])   . Coronary artery disease, non-occlusive    a.  LHC 06/19/2018: Mid LAD 50% with iFR 0.94  . Depression   . Diastolic dysfunction    a. 10/2016 Echo: EF 60-65%, no rwma, Gr1 DD, mild MR, nl LA size, nl RV fxn.  [name] GERD (gastroesophageal reflux disease)   . Hypotension   . IBS (irritable bowel syndrome)   . Right arm weakness   . Situational syncope   . TIA (transient ischemic attack)     Past Surgical History:  Procedure Laterality Date  . COLONOSCOPY WITH PROPOFOL N/A 11/22/2015   Procedure: COLONOSCOPY WITH PROPOFOL;  Surgeon: Manya Silvas, MD;  Location: [facility] ENDOSCOPY;  Service: Endoscopy;  Laterality: N/A;  . ESOPHAGOGASTRODUODENOSCOPY (EGD) WITH PROPOFOL N/A 11/22/2015   Procedure: ESOPHAGOGASTRODUODENOSCOPY (EGD) WITH PROPOFOL;  Surgeon: Manya Silvas, MD;  Location: [facility] ENDOSCOPY;  Service: Endoscopy;  Laterality: N/A;  . INTRAVASCULAR PRESSURE WIRE/FFR STUDY N/A 06/19/2018   Procedure: INTRAVASCULAR PRESSURE WIRE/FFR STUDY;  Surgeon: Nelva Bush, MD;  Location: [place] CV LAB;  Service: Cardiovascular;  Laterality: N/A;  . LEFT HEART CATH AND CORONARY ANGIOGRAPHY N/A 06/19/2018   Procedure: LEFT HEART CATH AND CORONARY ANGIOGRAPHY;  Surgeon: Nelva Bush, MD;  Location: [place] CV LAB;  Service: Cardiovascular;  Laterality: N/A;  . OOPHORECTOMY    . TONSILLECTOMY    . VAGINAL HYSTERECTOMY      Current Meds  Medication Sig  . aspirin 81 MG tablet Take 81 mg by mouth daily.  [name] atorvastatin (LIPITOR) 80 MG tablet Take 1 tablet (80 mg total) by mouth daily at 6 PM.  . clonazePAM (KLONOPIN) 1 MG tablet TAKE 1 TABLET BY MOUTH TWICE DAILY  . estradiol (ESTRACE) 1 MG tablet TAKE 1 TABLET BY MOUTH ONCE DAILY  . FLUoxetine (PROZAC) 20 MG capsule TAKE 1 CAPSULE BY MOUTH ONCE DAILY    Allergies:    Penicillin g and Iodinated diagnostic agents   Social History:  The patient  reports that she has been smoking cigarettes. She has a 39.00 pack-year smoking history. She has never used smokeless tobacco. She reports that she does not drink alcohol or use drugs.   Family History:  The patient's family history includes Bladder Cancer in her mother; Breast cancer (age of onset: 5) in her paternal aunt; COPD in her unknown relative; Colon cancer in her brother; Heart disease in her unknown relative; Stroke in her unknown relative.  ROS:   Review of Systems  Constitutional: Positive for malaise/fatigue. Negative for chills, diaphoresis, fever and weight loss.  HENT: Negative for congestion.   Eyes: Negative for discharge and redness.  Respiratory: Negative for cough, hemoptysis, sputum production, shortness of breath and wheezing.   Cardiovascular: Positive for chest pain. Negative for palpitations, orthopnea, claudication, leg swelling and PND.  Gastrointestinal: Negative for abdominal pain, blood in stool, heartburn, melena, nausea and vomiting.  Genitourinary: Negative for hematuria.  Musculoskeletal: Negative for falls and myalgias.  Skin: Negative for rash.  Neurological: Negative for dizziness, tingling, tremors, sensory change, speech  change, focal weakness, loss of consciousness and weakness.  Endo/Heme/Allergies: Does not bruise/bleed easily.  Psychiatric/Behavioral: Negative for substance abuse. The patient is nervous/anxious.   All other systems reviewed and are negative.    PHYSICAL EXAM:  VS:  BP 110/60 (BP Location: Left Arm, Patient Position: Sitting, Cuff Size: Normal)   Pulse (!) 58   Ht 5\' 1"  (1.549 m)   Wt 144 lb 8 oz (65.5 kg)   BMI 27.30 kg/m  BMI: Body mass index is 27.3 kg/m.  Physical Exam  Constitutional: She is oriented to person, place, and time. She appears well-developed and well-nourished.  HENT:  Head: Normocephalic and atraumatic.  Eyes: Right eye  exhibits no discharge. Left eye exhibits no discharge.  Neck: Normal range of motion. No JVD present.  Cardiovascular: Normal rate, regular rhythm, S1 normal, S2 normal and normal heart sounds. Exam reveals no distant heart sounds, no friction rub, no midsystolic click and no opening snap.  No murmur heard. Pulses:      Radial pulses are 2+ on the right side.       Posterior tibial pulses are 2+ on the right side, and 2+ on the left side.  Right renal cath site is well-healing without any bruising, bleeding, swelling, erythema, tenderness to palpation, or warmth.  Right radial pulse 2+  Pulmonary/Chest: Effort normal and breath sounds normal. No respiratory distress. She has no decreased breath sounds. She has no wheezes. She has no rales. She exhibits no tenderness.  Abdominal: Soft. She exhibits no distension. There is no tenderness.  Musculoskeletal: She exhibits no edema.  Neurological: She is alert and oriented to person, place, and time.  Skin: Skin is warm and dry. No cyanosis. Nails show no clubbing.  Psychiatric: She has a normal mood and affect. Her speech is normal and behavior is normal. Judgment and thought content normal.     EKG:  Was ordered and interpreted by me today. Shows sinus bradycardia, 58 bpm, no acute st/t changes   Recent Labs: 03/11/2018: ALT 12 06/17/2018: BUN 9; Creatinine, Ser 0.84; Hemoglobin 14.5; Platelets 294; Potassium 3.5; Sodium 139  06/13/2018: Cholesterol 222; HDL 45; LDL Cholesterol 151; Total CHOL/HDL Ratio 4.9; Triglycerides 128; VLDL 26   Estimated Creatinine Clearance: 64 mL/min (by C-G formula based on SCr of 0.84 mg/dL).   Wt Readings from Last 3 Encounters:  07/02/18 144 lb 8 oz (65.5 kg)  06/24/18 142 lb (64.4 kg)  06/19/18 143 lb 8 oz (65.1 kg)     Other studies reviewed: Additional studies/records reviewed today include: summarized above  ASSESSMENT AND PLAN:  1. Nonocclusive coronary artery disease without angina: Doing well  without symptoms concerning for angina.  Recommend patient try Imdur 15 mg daily (has prescription at home).  Continue aspirin 81 mg daily and Lipitor 80 mg daily.  Aggressive risk factor modification and primary prevention.  2. Hyperlipidemia: LDL of 151 from 05/2018.  Now on Lipitor 80 mg daily.  Check fasting lipid panel and liver function in mid December, 2019.  3. Anxiety/conversion disorder: Likely playing a significant role in her presentation.  Per PCP.  4. Abdominal atherosclerosis/coronary calcifications: Cardiac catheterization as above.  Lipitor as above.  Disposition: F/u with Dr. Rockey Situ or APP in 6 months.   Current medicines are reviewed at length with the patient today.  The patient did not have any concerns regarding medicines.  Signed, Christell Faith, PA-C 07/02/2018 2:31 PM     [address]  [place], [place] [address] [phone]

## 2018-07-10 ENCOUNTER — Ambulatory Visit: Payer: Self-pay | Admitting: Nurse Practitioner

## 2018-07-15 ENCOUNTER — Encounter

## 2018-07-15 ENCOUNTER — Ambulatory Visit: Payer: Self-pay | Admitting: Cardiovascular Disease

## 2018-07-15 ENCOUNTER — Ambulatory Visit: Payer: Self-pay | Admitting: Physician Assistant

## 2018-07-25 ENCOUNTER — Telehealth: Payer: Self-pay

## 2018-07-25 ENCOUNTER — Encounter

## 2018-07-25 ENCOUNTER — Ambulatory Visit: Payer: Medicare Other | Admitting: Neurology

## 2018-07-25 NOTE — Telephone Encounter (Signed)
Rn informed Jael to inform patient that  Dr. Leonie Man will be seeing her around 89 -145pm due to system wife computer issues. Pt was informed this when they check in at 12:46pm. Faith Rn also went to inform pt that Dr Leonie Man would be seeing them but after appt time. Rn went to get pt at 150pm and they were not in the waiting room. The pts had left per [facility] . The pt did not reschedule an appt.Dr. Leonie Man was aware of this.

## 2018-07-29 ENCOUNTER — Ambulatory Visit: Payer: Medicare Other | Admitting: Family Medicine

## 2018-08-08 ENCOUNTER — Telehealth: Payer: Self-pay | Admitting: *Deleted

## 2018-08-08 NOTE — Telephone Encounter (Signed)
Patient due for fasting lipid and liver lab work this month. Patient has not had done yet. Called patient as a friendly reminder. She is aware and verbalized understanding to be [facility]

## 2018-08-19 ENCOUNTER — Other Ambulatory Visit: Payer: Self-pay | Admitting: Family Medicine

## 2018-08-19 NOTE — Telephone Encounter (Signed)
Spoke with patient. She stated she has been out of medication since the 27th

## 2018-08-19 NOTE — Telephone Encounter (Signed)
Please see if she has enough to last her until her appointment with Dr. Jeananne Rama on 1/13. Thanks!

## 2018-08-19 NOTE — Telephone Encounter (Signed)
Enough medicine to make it to her appointment sent to her pharmacy.

## 2018-08-22 ENCOUNTER — Ambulatory Visit (INDEPENDENT_AMBULATORY_CARE_PROVIDER_SITE_OTHER): Payer: Medicare Other | Admitting: Family Medicine

## 2018-08-22 ENCOUNTER — Encounter: Payer: Self-pay | Admitting: Family Medicine

## 2018-08-22 VITALS — BP 126/70 | HR 72 | Ht 61.0 in | Wt 144.0 lb

## 2018-08-22 DIAGNOSIS — J069 Acute upper respiratory infection, unspecified: Secondary | ICD-10-CM | POA: Diagnosis not present

## 2018-08-22 DIAGNOSIS — R1030 Lower abdominal pain, unspecified: Secondary | ICD-10-CM | POA: Diagnosis not present

## 2018-08-22 DIAGNOSIS — R110 Nausea: Secondary | ICD-10-CM | POA: Diagnosis not present

## 2018-08-22 DIAGNOSIS — R6889 Other general symptoms and signs: Secondary | ICD-10-CM | POA: Diagnosis not present

## 2018-08-22 DIAGNOSIS — R399 Unspecified symptoms and signs involving the genitourinary system: Secondary | ICD-10-CM | POA: Diagnosis not present

## 2018-08-22 LAB — VERITOR FLU A/B WAIVED
Influenza A: NEGATIVE
Influenza B: NEGATIVE

## 2018-08-22 LAB — UA/M W/RFLX CULTURE, ROUTINE
Bilirubin, UA: NEGATIVE
Glucose, UA: NEGATIVE
Ketones, UA: NEGATIVE
LEUKOCYTES UA: NEGATIVE
NITRITE UA: NEGATIVE
PH UA: 5.5 (ref 5.0–7.5)
Protein, UA: NEGATIVE
RBC, UA: NEGATIVE
Specific Gravity, UA: <1.005 — ABNORMAL LOW (ref 1.005–1.030)
UUROB: 0.2 mg/dL (ref 0.2–1.0)

## 2018-08-22 MED ORDER — AZITHROMYCIN 250 MG PO TABS: ORAL_TABLET | ORAL | 0 refills | Status: DC

## 2018-08-22 MED ORDER — ONDANSETRON 4 MG PO TBDP: 4.0000 mg | ORAL_TABLET | Freq: Three times a day (TID) | ORAL | 0 refills | Status: DC | PRN

## 2018-08-22 NOTE — Progress Notes (Signed)
BP 126/70   Pulse 72   Ht 5\' 1"  (1.549 m)   Wt 144 lb (65.3 kg)   SpO2 98%   BMI 27.21 kg/m    Subjective:    Patient ID: Lindsey Bautista, female    DOB: March 26, 1961, 58 y.o.   MRN: [id number]  HPI: Lindsey Bautista is a 58 y.o. female  Chief Complaint  Patient presents with  . URI    Started last friday. Body aches, headache, fever, nasal congestion  . Back Pain    blood on tp after urination, abdomen pain. pressure   Headache, nausea, fever, congestion, sore throat for 1 week. Not taking anything OTC for sxs other than ibuprofen prn.   Also having abdominal pain, pressure, and some blood on tissue after wiping for about 4 days.   Relevant past medical, surgical, family and social history reviewed and updated as indicated. Interim medical history since our last visit reviewed. Allergies and medications reviewed and updated.  Review of Systems  Per HPI unless specifically indicated above     Objective:    BP 126/70   Pulse 72   Ht 5\' 1"  (1.549 m)   Wt 144 lb (65.3 kg)   SpO2 98%   BMI 27.21 kg/m   Wt Readings from Last 3 Encounters:  08/22/18 144 lb (65.3 kg)  07/02/18 144 lb 8 oz (65.5 kg)  06/24/18 142 lb (64.4 kg)    Physical Exam Vitals signs and nursing note reviewed.  Constitutional:      Appearance: Normal appearance.  HENT:     Head: Atraumatic.     Right Ear: Tympanic membrane and external ear normal.     Left Ear: Tympanic membrane and external ear normal.     Nose: Congestion present.     Mouth/Throat:     Mouth: Mucous membranes are moist.     Pharynx: Posterior oropharyngeal erythema present.  Eyes:     Extraocular Movements: Extraocular movements intact.     Conjunctiva/sclera: Conjunctivae normal.  Neck:     Musculoskeletal: Normal range of motion and neck supple.  Cardiovascular:     Rate and Rhythm: Normal rate and regular rhythm.     Heart sounds: Normal heart sounds.  Pulmonary:     Effort: Pulmonary effort is normal.     Breath  sounds: Normal breath sounds. No wheezing.  Abdominal:     General: Bowel sounds are normal. There is no distension.     Palpations: Abdomen is soft.     Tenderness: There is no abdominal tenderness. There is no right CVA tenderness, left CVA tenderness or guarding.  Musculoskeletal: Normal range of motion.  Skin:    General: Skin is warm and dry.  Neurological:     Mental Status: She is alert and oriented to person, place, and time.  Psychiatric:        Mood and Affect: Mood normal.        Thought Content: Thought content normal.     Results for orders placed or performed during the hospital encounter of 06/17/18  CBC  Result Value Ref Range   WBC 5.8 4.0 - 10.5 K/uL   RBC 4.82 3.87 - 5.11 MIL/uL   Hemoglobin 14.5 12.0 - 15.0 g/dL   HCT 42.8 36.0 - 46.0 %   MCV 88.8 80.0 - 100.0 fL   MCH 30.1 26.0 - 34.0 pg   MCHC 33.9 30.0 - 36.0 g/dL   RDW 12.8 11.5 - 15.5 %  Platelets 294 150 - 400 K/uL   nRBC 0.0 0.0 - 0.2 %  Basic metabolic panel  Result Value Ref Range   Sodium 139 135 - 145 mmol/L   Potassium 3.5 3.5 - 5.1 mmol/L   Chloride 102 98 - 111 mmol/L   CO2 28 22 - 32 mmol/L   Glucose, Bld 139 (H) 70 - 99 mg/dL   BUN 9 6 - 20 mg/dL   Creatinine, Ser 0.84 0.44 - 1.00 mg/dL   Calcium 9.2 8.9 - 10.3 mg/dL   GFR calc non Af Amer >60 >60 mL/min   GFR calc Af Amer >60 >60 mL/min   Anion gap 9 5 - 15  Troponin I  Result Value Ref Range   Troponin I <0.03 <0.03 ng/mL  Troponin I  Result Value Ref Range   Troponin I <0.03 <0.03 ng/mL  Troponin I  Result Value Ref Range   Troponin I <0.03 <0.03 ng/mL  Pregnancy, urine POC  Result Value Ref Range   Preg Test, Ur NEGATIVE NEGATIVE  POCT Activated clotting time  Result Value Ref Range   Activated Clotting Time 285 seconds      Assessment & Plan:   Problem List Items Addressed This Visit    None    Visit Diagnoses    Upper respiratory tract infection, unspecified type    -  Primary   Rapid flu neg, given duration  will tx with zpak. Supportive care reviewed. Follow up if not improving   Relevant Medications   azithromycin (ZITHROMAX) 250 MG tablet   Other Relevant Orders   Veritor Flu A/B Waived   Lower abdominal pain       Improving, U/A exam and vitals benign today. Zofran sent for nausea, increase PO intake as tolerated. Return precautions reviewed   Relevant Orders   UA/M w/rflx Culture, Routine   Nausea       Tx with zofran, BRAT diet, lots of fluids       Follow up plan: Return if symptoms worsen or fail to improve.

## 2018-09-02 ENCOUNTER — Encounter: Payer: Self-pay | Admitting: Family Medicine

## 2018-09-02 ENCOUNTER — Ambulatory Visit (INDEPENDENT_AMBULATORY_CARE_PROVIDER_SITE_OTHER): Payer: Medicare Other | Admitting: Family Medicine

## 2018-09-02 VITALS — BP 125/68 | HR 81 | Ht 61.0 in | Wt 144.0 lb

## 2018-09-02 DIAGNOSIS — F419 Anxiety disorder, unspecified: Secondary | ICD-10-CM

## 2018-09-02 DIAGNOSIS — R55 Syncope and collapse: Secondary | ICD-10-CM

## 2018-09-02 DIAGNOSIS — I2510 Atherosclerotic heart disease of native coronary artery without angina pectoris: Secondary | ICD-10-CM | POA: Insufficient documentation

## 2018-09-02 LAB — LP+ALT+AST PICCOLO, WAIVED
ALT (SGPT) PICCOLO, WAIVED: 17 U/L (ref 10–47)
AST (SGOT) PICCOLO, WAIVED: 24 U/L (ref 11–38)
CHOLESTEROL PICCOLO, WAIVED: 164 mg/dL (ref <200)
Chol/HDL Ratio Piccolo,Waive: 2.8 mg/dL
HDL Chol Piccolo, Waived: 58 mg/dL (ref >59)
LDL Chol Calc Piccolo Waived: 91 mg/dL (ref <100)
Triglycerides Piccolo,Waived: 76 mg/dL (ref <150)
VLDL Chol Calc Piccolo,Waive: 15 mg/dL (ref <30)

## 2018-09-02 MED ORDER — EZETIMIBE 10 MG PO TABS: 10.0000 mg | ORAL_TABLET | Freq: Every day | ORAL | 3 refills | Status: DC

## 2018-09-02 MED ORDER — FLUOXETINE HCL 20 MG PO CAPS: 20.0000 mg | ORAL_CAPSULE | Freq: Every day | ORAL | 2 refills | Status: DC

## 2018-09-02 MED ORDER — ESTRADIOL 1 MG PO TABS: 1.0000 mg | ORAL_TABLET | Freq: Every day | ORAL | 1 refills | Status: DC

## 2018-09-02 MED ORDER — CLONAZEPAM 1 MG PO TABS: 1.0000 mg | ORAL_TABLET | Freq: Two times a day (BID) | ORAL | 5 refills | Status: DC

## 2018-09-02 NOTE — Assessment & Plan Note (Signed)
Stable with no further episodes 

## 2018-09-02 NOTE — Assessment & Plan Note (Addendum)
Takes Lipitor will check lipid panel today LDL 91 will add Zetia to regimen recheck this spring

## 2018-09-02 NOTE — Progress Notes (Signed)
BP 125/68   Pulse 81   Ht 5\' 1"  (1.549 m)   Wt 144 lb (65.3 kg)   SpO2 97%   BMI 27.21 kg/m    Subjective:    Patient ID: Lindsey Bautista, female    DOB: Sep 13, 1960, 58 y.o.   MRN: [id number]  HPI: Lindsey Bautista is a 58 y.o. female  Chief Complaint  Patient presents with  . Follow-up  . Hypertension  . Hyperlipidemia  . Depression    Stopped Seroquel to do side effects  . Anxiety   Patient all in all doing well has discovered sleep does not seem to help anything and was sleeping 20 out of 24 hours.  Now is getting up staying awake during the day even though it becomes a long day.  Nerves are better and energy is a little better.  Patient is taking fluoxetine 20 mg without problems once a day taking clonazepam 1 mg twice a day for anxiety nerves and that really helps her get through with her various other ailments and nerve issues. Stop Seroquel because of too much drowsiness and stopped isosorbide because of headaches no cardiovascular symptoms as long as she is taking her clonazepam. Takes Estrace daily without problems seems to really help with hot flashes. Taking atorvastatin 80 mg without problems along with aspirin 81 mg without problems.  Of note with the above regimen patient has not been back to the emergency room since September or October.  This is probably the longest time she is gone without specialty testing in  Years.  Relevant past medical, surgical, family and social history reviewed and updated as indicated. Interim medical history since our last visit reviewed. Allergies and medications reviewed and updated.  Review of Systems  Constitutional: Negative.   Respiratory: Negative.   Cardiovascular: Negative.     Per HPI unless specifically indicated above     Objective:    BP 125/68   Pulse 81   Ht 5\' 1"  (1.549 m)   Wt 144 lb (65.3 kg)   SpO2 97%   BMI 27.21 kg/m   Wt Readings from Last 3 Encounters:  09/02/18 144 lb (65.3 kg)  08/22/18 144 lb  (65.3 kg)  07/02/18 144 lb 8 oz (65.5 kg)    Physical Exam Constitutional:      Appearance: She is well-developed.  HENT:     Head: Normocephalic and atraumatic.  Eyes:     Conjunctiva/sclera: Conjunctivae normal.  Neck:     Musculoskeletal: Normal range of motion.  Cardiovascular:     Rate and Rhythm: Normal rate and regular rhythm.     Heart sounds: Normal heart sounds.  Pulmonary:     Effort: Pulmonary effort is normal.     Breath sounds: Normal breath sounds.  Musculoskeletal: Normal range of motion.  Skin:    Findings: No erythema.  Neurological:     Mental Status: She is alert and oriented to person, place, and time.  Psychiatric:        Behavior: Behavior normal.        Thought Content: Thought content normal.        Judgment: Judgment normal.     Results for orders placed or performed in visit on 08/22/18  Veritor Flu A/B Waived  Result Value Ref Range   Influenza A Negative Negative   Influenza B Negative Negative  UA/M w/rflx Culture, Routine  Result Value Ref Range   Specific Gravity, UA <1.005 (L) 1.005 - 1.030  pH, UA 5.5 5.0 - 7.5   Color, UA Yellow Yellow   Appearance Ur Clear Clear   Leukocytes, UA Negative Negative   Protein, UA Negative Negative/Trace   Glucose, UA Negative Negative   Ketones, UA Negative Negative   RBC, UA Negative Negative   Bilirubin, UA Negative Negative   Urobilinogen, Ur 0.2 0.2 - 1.0 mg/dL   Nitrite, UA Negative Negative      Assessment & Plan:   Problem List Items Addressed This Visit      Cardiovascular and Mediastinum   Syncope and collapse    Stable with no further episodes      Relevant Medications   ezetimibe (ZETIA) 10 MG tablet   Other Relevant Orders   Basic metabolic panel   Coronary artery disease, non-occlusive    Takes Lipitor will check lipid panel today LDL 91 will add Zetia to regimen recheck this spring      Relevant Medications   ezetimibe (ZETIA) 10 MG tablet   Other Relevant Orders    Basic metabolic panel   LP+ALT+AST [place], Waived     Other   Anxiety - Primary   Relevant Medications   clonazePAM (KLONOPIN) 1 MG tablet   FLUoxetine (PROZAC) 20 MG capsule       Follow up plan: Return in about 3 months (around 12/02/2018) for Physical Exam.

## 2018-09-03 ENCOUNTER — Encounter: Payer: Self-pay | Admitting: Family Medicine

## 2018-09-03 LAB — BASIC METABOLIC PANEL
BUN / CREAT RATIO: 12 (ref 9–23)
BUN: 9 mg/dL (ref 6–24)
CO2: 23 mmol/L (ref 20–29)
CREATININE: 0.75 mg/dL (ref 0.57–1.00)
Calcium: 9.3 mg/dL (ref 8.7–10.2)
Chloride: 104 mmol/L (ref 96–106)
GFR calc Af Amer: 102 mL/min/{1.73_m2} (ref >59)
GFR, EST NON AFRICAN AMERICAN: 89 mL/min/{1.73_m2} (ref >59)
Glucose: 82 mg/dL (ref 65–99)
Potassium: 4.2 mmol/L (ref 3.5–5.2)
SODIUM: 140 mmol/L (ref 134–144)

## 2018-09-04 ENCOUNTER — Ambulatory Visit: Payer: Self-pay

## 2018-09-04 NOTE — Telephone Encounter (Signed)
LVM for patient. Explained we will try call again tomorrow.

## 2018-09-04 NOTE — Telephone Encounter (Signed)
Call pt 

## 2018-09-04 NOTE — Telephone Encounter (Signed)
Returned call to patient who states that about 2 weeks ago she was seen and given azithromycin for an sinus and chest infection.  She states that it had cleared but is now coming back.  She has a cough especially at night and coughs up tan phlegm.  She has a low grade fever of 99 today. Her nose is runny. She refuses an appointment and is requesting an antibiotic to be phoned to her pharmacy. Care advice read to patient. Pt verbalized understanding of all instructions. Reason for Disposition . [1] Nasal discharge AND [2] present > 10 days  Answer Assessment - Initial Assessment Questions 1. ONSET: "When did the cough begin?"      2 weeks ago 2. SEVERITY: "How bad is the cough today?"      Severe cough at night 3. RESPIRATORY DISTRESS: "Describe your breathing."      yes 4. FEVER: "Do you have a fever?" If so, ask: "What is your temperature, how was it measured, and when did it start?"     99 5. SPUTUM: "Describe the color of your sputum" (clear, white, yellow, green)     tan 6. HEMOPTYSIS: "Are you coughing up any blood?" If so ask: "How much?" (flecks, streaks, tablespoons, etc.)     no 7. CARDIAC HISTORY: "Do you have any history of heart disease?" (e.g., heart attack, congestive heart failure)      no 8. LUNG HISTORY: "Do you have any history of lung disease?"  (e.g., pulmonary embolus, asthma, emphysema)     no 9. PE RISK FACTORS: "Do you have a history of blood clots?" (or: recent major surgery, recent prolonged travel, bedridden)     no 10. OTHER SYMPTOMS: "Do you have any other symptoms?" (e.g., runny nose, wheezing, chest pain)       Runny to stuffy nose 11. PREGNANCY: "Is there any chance you are pregnant?" "When was your last menstrual period?"       N/A 12. TRAVEL: "Have you traveled out of the country in the last month?" (e.g., travel history, exposures)       no  Protocols used: [place]

## 2018-09-09 ENCOUNTER — Other Ambulatory Visit: Payer: Self-pay | Admitting: Family Medicine

## 2018-09-09 MED ORDER — AZITHROMYCIN 250 MG PO TABS: ORAL_TABLET | ORAL | 0 refills | Status: DC

## 2018-09-09 NOTE — Telephone Encounter (Signed)
Patient was transferred to provider for telephone conversation.   

## 2018-09-09 NOTE — Progress Notes (Signed)
Phone call Discussed with patient sinusitis will start Z-Pak.

## 2018-11-08 ENCOUNTER — Telehealth: Payer: Medicare Other

## 2018-11-08 ENCOUNTER — Ambulatory Visit: Payer: Self-pay | Admitting: Licensed Clinical Social Worker

## 2018-11-08 NOTE — Chronic Care Management (AMB) (Signed)
  Care Management Note   Lindsey Bautista is a 58 y.o. year old female who is a primary care patient of Crissman, Jeannette How, MD. The CM team was consulted for assistance with chronic disease management and care coordination.   I reached out to Lindsey Bautista by phone today to introduce the CCM program but was unable to reach her successfully. [name] left a HIPPA compliant voice message encouraging patient to return call once available.  Review of patient status, including review of consultants reports, relevant laboratory and other test results, and collaboration with appropriate care team members and the patient's provider was performed as part of comprehensive patient evaluation and provision of chronic care management services.   Follow Up Plan: The CM team will reach out to the patient again over the next 14-28 days.   Eula Fried, BSW, MSW, [facility] Care Management [email or web address] Phone: [phone]

## 2018-11-18 ENCOUNTER — Encounter: Payer: Self-pay | Admitting: Nurse Practitioner

## 2018-11-18 ENCOUNTER — Ambulatory Visit (INDEPENDENT_AMBULATORY_CARE_PROVIDER_SITE_OTHER): Payer: Medicare Other | Admitting: Nurse Practitioner

## 2018-11-18 ENCOUNTER — Other Ambulatory Visit: Payer: Self-pay

## 2018-11-18 ENCOUNTER — Telehealth: Payer: Self-pay | Admitting: Family Medicine

## 2018-11-18 VITALS — BP 125/79 | HR 60 | Temp 98.4°F

## 2018-11-18 DIAGNOSIS — I2510 Atherosclerotic heart disease of native coronary artery without angina pectoris: Secondary | ICD-10-CM

## 2018-11-18 DIAGNOSIS — N760 Acute vaginitis: Secondary | ICD-10-CM | POA: Diagnosis not present

## 2018-11-18 DIAGNOSIS — K5901 Slow transit constipation: Secondary | ICD-10-CM

## 2018-11-18 DIAGNOSIS — B379 Candidiasis, unspecified: Secondary | ICD-10-CM | POA: Diagnosis not present

## 2018-11-18 DIAGNOSIS — R1030 Lower abdominal pain, unspecified: Secondary | ICD-10-CM | POA: Diagnosis not present

## 2018-11-18 DIAGNOSIS — B9689 Other specified bacterial agents as the cause of diseases classified elsewhere: Secondary | ICD-10-CM

## 2018-11-18 LAB — WET PREP FOR TRICH, YEAST, CLUE
Clue Cell Exam: POSITIVE — AB
TRICHOMONAS EXAM: NEGATIVE
YEAST EXAM: POSITIVE — AB

## 2018-11-18 LAB — UA/M W/RFLX CULTURE, ROUTINE
BILIRUBIN UA: NEGATIVE
Glucose, UA: NEGATIVE
Leukocytes,UA: NEGATIVE
Nitrite, UA: NEGATIVE
PH UA: 5.5 (ref 5.0–7.5)
Protein,UA: NEGATIVE
RBC, UA: NEGATIVE
Specific Gravity, UA: 1.02 (ref 1.005–1.030)
UUROB: 0.2 mg/dL (ref 0.2–1.0)

## 2018-11-18 MED ORDER — FLUCONAZOLE 150 MG PO TABS: 150.0000 mg | ORAL_TABLET | Freq: Once | ORAL | 0 refills | Status: AC

## 2018-11-18 MED ORDER — METRONIDAZOLE 500 MG PO TABS: 500.0000 mg | ORAL_TABLET | Freq: Two times a day (BID) | ORAL | 0 refills | Status: DC

## 2018-11-18 NOTE — Assessment & Plan Note (Signed)
Acute with positive clue cells on wet prep, UA neg except for ketones.  Educated patient on BV.  Script for Flagyl sent to pharmacy and advised to take this on full stomach and avoid alcohol.  Return for worsening or continued symptoms.

## 2018-11-18 NOTE — Assessment & Plan Note (Signed)
Acute with yeast positive on wet prep.  Diflucan script sent.  Advised to hold Atorvastatin x 48 hours after Diflucan dose to avoid interaction with Diflucan.  Discussed vaginal care, including avoiding douches at home.  Return for worsening or continued symptoms.

## 2018-11-18 NOTE — Telephone Encounter (Signed)
Pt was scheduled by pec has a swollen belly and belly pain does pt need to be seen?

## 2018-11-18 NOTE — Assessment & Plan Note (Signed)
Ongoing since stroke in October 2019.  Recommended to continue Colace as has been taking, but to also add on daily Miralax in the morning to help bulk stool.  Notify provider if ongoing issues.

## 2018-11-18 NOTE — Progress Notes (Signed)
BP 125/79   Pulse 60   Temp 98.4 F (36.9 C) (Oral)   SpO2 97%    Subjective:    Patient ID: Lindsey Bautista, female    DOB: November 05, 1960, 58 y.o.   MRN: [id number]  HPI: Lindsey Bautista is a 58 y.o. female  Chief Complaint  Patient presents with  . Pain    pt states she has been having lower abdominal pain that goes around to her back for the past 3 days   ABDOMINAL PAIN  Started three days ago.  Reports suprapubic and radiates around to left back.  Has been taking Tylenol and Ibuprofen at home with no relief.  She reports some increased vaginal discharge with a little tan color and mild odor.  Does endorse poor bowel pattern, reports before her stroke in October 2019 she had a bowel movement every day because she was on schedule and going to work, now has bowel movement twice a week and often strains with these.  States she is taking Colace once a day at home.  At this time she does report some bloating with the abdominal discomfort.   Duration:days Onset: gradual Severity: 7/10 Quality: dull and pressure-like, constant Location:  suprapubic". "lower abdominal quadrants  Episode duration:  Radiation: yes Frequency: constant Alleviating factors:  Aggravating factors: Status: fluctuating Treatments attempted: none Fever: thinks she had one a night ago, as she woke-up covered in sweat and had chills Nausea: yes Vomiting: no Weight loss: no Decreased appetite: yes Diarrhea: no Constipation: no Blood in stool: no Heartburn: no Jaundice: no Rash: no Dysuria/urinary frequency: no dysuria.  Endorses frequency, small amounts, and pressure when she has to void. Hematuria: no History of sexually transmitted disease: no Recurrent NSAID use: no  Relevant past medical, surgical, family and social history reviewed and updated as indicated. Interim medical history since our last visit reviewed. Allergies and medications reviewed and updated.  Review of Systems  Constitutional:  Positive for appetite change (decreased over past 3 days) and chills. Negative for activity change, fatigue and fever.  Eyes: Negative for pain and visual disturbance.  Respiratory: Negative for cough, chest tightness, shortness of breath and wheezing.   Cardiovascular: Negative for chest pain, palpitations and leg swelling.  Gastrointestinal: Positive for abdominal distention, abdominal pain, constipation and nausea. Negative for blood in stool, diarrhea and vomiting.  Endocrine: Negative.   Musculoskeletal: Negative for myalgias.  Neurological: Negative for dizziness, numbness and headaches.  Psychiatric/Behavioral: Negative.     Per HPI unless specifically indicated above     Objective:    BP 125/79   Pulse 60   Temp 98.4 F (36.9 C) (Oral)   SpO2 97%   Wt Readings from Last 3 Encounters:  09/02/18 144 lb (65.3 kg)  08/22/18 144 lb (65.3 kg)  07/02/18 144 lb 8 oz (65.5 kg)    Physical Exam Vitals signs and nursing note reviewed.  Constitutional:      General: She is awake.     Appearance: She is well-developed.  HENT:     Head: Normocephalic.  Eyes:     General:        Right eye: No discharge.        Left eye: No discharge.     Conjunctiva/sclera: Conjunctivae normal.     Pupils: Pupils are equal, round, and reactive to light.  Neck:     Musculoskeletal: Normal range of motion and neck supple.     Thyroid: No thyromegaly.  Vascular: No carotid bruit or JVD.  Cardiovascular:     Rate and Rhythm: Normal rate and regular rhythm.     Heart sounds: Normal heart sounds.  Pulmonary:     Effort: Pulmonary effort is normal.     Breath sounds: Normal breath sounds.  Abdominal:     General: Bowel sounds are normal.     Palpations: Abdomen is soft. There is no hepatomegaly or splenomegaly.     Tenderness: There is abdominal tenderness in the right lower quadrant and suprapubic area. There is no right CVA tenderness, left CVA tenderness, guarding or rebound.     Hernia:  No hernia is present.     Comments: Mild abdominal distension with no masses noted.  Tenderness to RLQ and suprapubic area with RLQ>suprapubic.  No guarding.    Lymphadenopathy:     Cervical: No cervical adenopathy.  Skin:    General: Skin is warm and dry.  Neurological:     Mental Status: She is alert and oriented to person, place, and time.  Psychiatric:        Attention and Perception: Attention normal.        Mood and Affect: Mood normal.        Speech: Speech normal.        Behavior: Behavior normal. Behavior is cooperative.        Thought Content: Thought content normal.        Judgment: Judgment normal.     Results for orders placed or performed in visit on 27/06/23  Basic metabolic panel  Result Value Ref Range   Glucose 82 65 - 99 mg/dL   BUN 9 6 - 24 mg/dL   Creatinine, Ser 0.75 0.57 - 1.00 mg/dL   GFR calc non Af Amer 89 >59 mL/min/1.73   GFR calc Af Amer 102 >59 mL/min/1.73   BUN/Creatinine Ratio 12 9 - 23   Sodium 140 134 - 144 mmol/L   Potassium 4.2 3.5 - 5.2 mmol/L   Chloride 104 96 - 106 mmol/L   CO2 23 20 - 29 mmol/L   Calcium 9.3 8.7 - 10.2 mg/dL  LP+ALT+AST Piccolo, Waived  Result Value Ref Range   ALT (SGPT) Piccolo, Waived 17 10 - 47 U/L   AST (SGOT) Piccolo, Waived 24 11 - 38 U/L   Cholesterol Piccolo, Waived 164 <200 mg/dL   HDL Chol Piccolo, Waived 58 >59 mg/dL   Triglycerides Piccolo,Waived 76 <150 mg/dL   Chol/HDL Ratio Piccolo,Waive 2.8 mg/dL   LDL Chol Calc Piccolo Waived 91 <100 mg/dL   VLDL Chol Calc Piccolo,Waive 15 <30 mg/dL      Assessment & Plan:   Problem List Items Addressed This Visit      Digestive   Slow transit constipation    Ongoing since stroke in October 2019.  Recommended to continue Colace as has been taking, but to also add on daily Miralax in the morning to help bulk stool.  Notify provider if ongoing issues.          Genitourinary   Bacterial vaginosis - Primary    Acute with positive clue cells on wet prep, UA  neg except for ketones.  Educated patient on BV.  Script for Flagyl sent to pharmacy and advised to take this on full stomach and avoid alcohol.  Return for worsening or continued symptoms.        Relevant Medications   metroNIDAZOLE (FLAGYL) 500 MG tablet   fluconazole (DIFLUCAN) 150 MG tablet   Other Relevant  Orders   UA/M w/rflx Culture, Routine   WET PREP FOR TRICH, YEAST, CLUE     Other   Yeast infection    Acute with yeast positive on wet prep.  Diflucan script sent.  Advised to hold Atorvastatin x 48 hours after Diflucan dose to avoid interaction with Diflucan.  Discussed vaginal care, including avoiding douches at home.  Return for worsening or continued symptoms.      Relevant Medications   metroNIDAZOLE (FLAGYL) 500 MG tablet   fluconazole (DIFLUCAN) 150 MG tablet       Follow up plan: Return if symptoms worsen or fail to improve.

## 2018-11-18 NOTE — Patient Instructions (Signed)
Vaginal Yeast infection, Adult  Vaginal yeast infection is a condition that causes vaginal discharge as well as soreness, swelling, and redness (inflammation) of the vagina. This is a common condition. Some women get this infection frequently. What are the causes? This condition is caused by a change in the normal balance of the yeast (candida) and bacteria that live in the vagina. This change causes an overgrowth of yeast, which causes the inflammation. What increases the risk? The condition is more likely to develop in women who:  Take antibiotic medicines.  Have diabetes.  Take birth control pills.  Are pregnant.  Douche often.  Have a weak body defense system (immune system).  Have been taking steroid medicines for a long time.  Frequently wear tight clothing. What are the signs or symptoms? Symptoms of this condition include:  White, thick, creamy vaginal discharge.  Swelling, itching, redness, and irritation of the vagina. The lips of the vagina (vulva) may be affected as well.  Pain or a burning feeling while urinating.  Pain during sex. How is this diagnosed? This condition is diagnosed based on:  Your medical history.  A physical exam.  A pelvic exam. Your health care provider will examine a sample of your vaginal discharge under a microscope. Your health care provider may send this sample for testing to confirm the diagnosis. How is this treated? This condition is treated with medicine. Medicines may be over-the-counter or prescription. You may be told to use one or more of the following:  Medicine that is taken by mouth (orally).  Medicine that is applied as a cream (topically).  Medicine that is inserted directly into the vagina (suppository). Follow these instructions at home:  Lifestyle  Do not have sex until your health care provider approves. Tell your sex partner that you have a yeast infection. That person should go to his or her health care  provider and ask if they should also be treated.  Do not wear tight clothes, such as pantyhose or tight pants.  Wear breathable cotton underwear. General instructions  Take or apply over-the-counter and prescription medicines only as told by your health care provider.  Eat more yogurt. This may help to keep your yeast infection from returning.  Do not use tampons until your health care provider approves.  Try taking a sitz bath to help with discomfort. This is a warm water bath that is taken while you are sitting down. The water should only come up to your hips and should cover your buttocks. Do this 3-4 times per day or as told by your health care provider.  Do not douche.  If you have diabetes, keep your blood sugar levels under control.  Keep all follow-up visits as told by your health care provider. This is important. Contact a health care provider if:  You have a fever.  Your symptoms go away and then return.  Your symptoms do not get better with treatment.  Your symptoms get worse.  You have new symptoms.  You develop blisters in or around your vagina.  You have blood coming from your vagina and it is not your menstrual period.  You develop pain in your abdomen. Summary  Vaginal yeast infection is a condition that causes discharge as well as soreness, swelling, and redness (inflammation) of the vagina.  This condition is treated with medicine. Medicines may be over-the-counter or prescription.  Take or apply over-the-counter and prescription medicines only as told by your health care provider.  Do not douche.   Do not have sex or use tampons until your health care provider approves.  Contact a health care provider if your symptoms do not get better with treatment or your symptoms go away and then return. This information is not intended to replace advice given to you by your health care provider. Make sure you discuss any questions you have with your health care  provider. Document Released: 05/17/2005 Document Revised: 12/24/2017 Document Reviewed: 12/24/2017 Elsevier Interactive Patient Education  2019 Elsevier Inc.  Bacterial Vaginosis  Bacterial vaginosis is an infection of the vagina. It happens when too many normal germs (healthy bacteria) grow in the vagina. This infection puts you at risk for infections from sex (STIs). Treating this infection can lower your risk for some STIs. You should also treat this if you are pregnant. It can cause your baby to be born early. Follow these instructions at home: Medicines  Take over-the-counter and prescription medicines only as told by your doctor.  Take or use your antibiotic medicine as told by your doctor. Do not stop taking or using it even if you start to feel better. General instructions  If you your sexual partner is a woman, tell her that you have this infection. She needs to get treatment if she has symptoms. If you have a female partner, he does not need to be treated.  During treatment: ? Avoid sex. ? Do not douche. ? Avoid alcohol as told. ? Avoid breastfeeding as told.  Drink enough fluid to keep your pee (urine) clear or pale yellow.  Keep your vagina and butt (rectum) clean. ? Wash the area with warm water every day. ? Wipe from front to back after you use the toilet.  Keep all follow-up visits as told by your doctor. This is important. Preventing this condition  Do not douche.  Use only warm water to wash around your vagina.  Use protection when you have sex. This includes: ? Latex condoms. ? Dental dams.  Limit how many people you have sex with. It is best to only have sex with the same person (be monogamous).  Get tested for STIs. Have your partner get tested.  Wear underwear that is cotton or lined with cotton.  Avoid tight pants and pantyhose. This is most important in summer.  Do not use any products that have nicotine or tobacco in them. These include  cigarettes and e-cigarettes. If you need help quitting, ask your doctor.  Do not use illegal drugs.  Limit how much alcohol you drink. Contact a doctor if:  Your symptoms do not get better, even after you are treated.  You have more discharge or pain when you pee (urinate).  You have a fever.  You have pain in your belly (abdomen).  You have pain with sex.  Your bleed from your vagina between periods. Summary  This infection happens when too many germs (bacteria) grow in the vagina.  Treating this condition can lower your risk for some infections from sex (STIs).  You should also treat this if you are pregnant. It can cause early (premature) birth.  Do not stop taking or using your antibiotic medicine even if you start to feel better. This information is not intended to replace advice given to you by your health care provider. Make sure you discuss any questions you have with your health care provider. Document Released: 05/16/2008 Document Revised: 04/22/2016 Document Reviewed: 04/22/2016 Elsevier Interactive Patient Education  2019 Elsevier Inc.  

## 2018-11-18 NOTE — Telephone Encounter (Signed)
Seen by provider in clinic

## 2018-11-22 ENCOUNTER — Ambulatory Visit: Payer: Self-pay | Admitting: Licensed Clinical Social Worker

## 2018-11-22 ENCOUNTER — Telehealth: Payer: Self-pay

## 2018-11-22 NOTE — Chronic Care Management (AMB) (Signed)
  Chronic Care Management    Clinical Social Work General Note  11/22/2018 Name: Lindsey Bautista MRN: [id number] DOB: 1960/11/10  Lindsey Bautista is a 58 y.o. year old female who is a primary care patient of Crissman, Jeannette How, MD. The CCM was consulted to assist the patient with Mental Health Counseling and Resources. [name] completed second outreach attempt but was unable to reach patient successfully. HIPPA compliant voice message left encouraging a return call once available.  Review of patient status, including review of consultants reports, relevant laboratory and other test results, and collaboration with appropriate care team members and the patient's provider was performed as part of comprehensive patient evaluation and provision of chronic care management services.    Follow Up Plan: SW will follow up with patient by phone over the next 30 days      Eula Fried, [place], MSW, [email or web address] Phone: [phone]

## 2018-12-20 ENCOUNTER — Other Ambulatory Visit: Payer: Self-pay

## 2018-12-20 ENCOUNTER — Ambulatory Visit: Payer: Medicare Other | Admitting: Licensed Clinical Social Worker

## 2018-12-20 DIAGNOSIS — F419 Anxiety disorder, unspecified: Secondary | ICD-10-CM

## 2018-12-20 NOTE — Patient Instructions (Signed)
Licensed Clinical Social Worker Visit Information  Goals we discussed today:  Goals Addressed    . "I want to alleviate stress as much as possible" (pt-stated)       Current Barriers:  [name] Mental Health Concerns  . Limited education about appropriate self-care methods to implement into her daily routine to combat depression and stress* . Limited access to caregiver- in terms of caring of her sick mother  . Lacks knowledge of community resource: Available mental health support services  Clinical Social Work Clinical Goal(s):  [name] Over the next 90 days, patient will work with [name] to address needs related to learning healthy stress management  Interventions: . Patient interviewed and appropriate assessments performed . [name] provided talk therapy during session  . Patient was able to identify key triggers that cause her anxiety/stress . Provided mental health counseling with regard to Depression . Discussed plans with patient for ongoing care management follow up and provided patient with direct contact information for care management team . Assisted patient/caregiver with obtaining information about health plan benefits  Patient Self Care Activities:  . Self administers medications as prescribed . Attends all scheduled provider appointments . Calls provider office for new concerns or questions  Initial goal documentation       Materials provided: Verbal education about mental health support resources provided by phone  Lindsey Bautista was given information about Chronic Care Management services today including:  1. CCM service includes personalized support from designated clinical staff supervised by her physician, including individualized plan of care and coordination with other care providers 2. 24/7 contact phone numbers for assistance for urgent and routine care needs. 3. Service will only be billed when office clinical staff spend 20 minutes or more in a month to coordinate  care. 4. Only one practitioner may furnish and bill the service in a calendar month. 5. The patient may stop CCM services at any time (effective at the end of the month) by phone call to the office staff. 6. The patient will be responsible for cost sharing (co-pay) of up to 20% of the service fee (after annual deductible is met).  Patient agreed to services and verbal consent obtained.   The patient verbalized understanding of instructions provided today and declined a print copy of patient instruction materials.   Follow up plan: SW will follow up with patient by phone over the next 30 days   Lindsey Bautista, [place], MSW, [email or web address] Phone: [phone]

## 2018-12-20 NOTE — Chronic Care Management (AMB) (Signed)
  Chronic Care Management    Clinical Social Work General Note  12/20/2018 Name: Lindsey Bautista MRN: [id number] DOB: Aug 26, 1960  Lindsey Bautista is a 58 y.o. year old female who is a primary care patient of Crissman, Jeannette How, MD. The CCM was consulted to assist the patient with Mental Health Counseling and Resources.   Lindsey Bautista was given information about Chronic Care Management services today including:  1. CCM service includes personalized support from designated clinical staff supervised by her physician, including individualized plan of care and coordination with other care providers 2. 24/7 contact phone numbers for assistance for urgent and routine care needs. 3. Service will only be billed when office clinical staff spend 20 minutes or more in a month to coordinate care. 4. Only one practitioner may furnish and bill the service in a calendar month. 5. The patient may stop CCM services at any time (effective at the end of the month) by phone call to the office staff. 6. The patient will be responsible for cost sharing (co-pay) of up to 20% of the service fee (after annual deductible is met).  Patient agreed to services and verbal consent obtained.   Review of patient status, including review of consultants reports, relevant laboratory and other test results, and collaboration with appropriate care team members and the patient's provider was performed as part of comprehensive patient evaluation and provision of chronic care management services.    Goals Addressed    . "I want to alleviate stress as much as possible" (pt-stated)       Current Barriers:  [name] Mental Health Concerns  . Limited education about appropriate self-care methods to implement into her daily routine to combat depression and stress* . Limited access to caregiver- in terms of caring of her sick mother  . Lacks knowledge of community resource: Available mental health support services  Clinical Social Work Clinical  Goal(s):  [name] Over the next 90 days, patient will work with [name] to address needs related to learning healthy stress management  Interventions: . Patient interviewed and appropriate assessments performed . [name] provided talk therapy during session  . Patient was able to identify key triggers that cause her anxiety/stress . Provided mental health counseling with regard to Depression . Discussed plans with patient for ongoing care management follow up and provided patient with direct contact information for care management team . Assisted patient/caregiver with obtaining information about health plan benefits  Patient Self Care Activities:  . Self administers medications as prescribed . Attends all scheduled provider appointments . Calls provider office for new concerns or questions  Initial goal documentation       Follow Up Plan: SW will follow up with patient by phone over the next 30 days      Eula Fried, [place], MSW, [email or web address] Phone: [phone]

## 2019-01-09 ENCOUNTER — Other Ambulatory Visit: Payer: Self-pay

## 2019-01-09 ENCOUNTER — Encounter: Payer: Self-pay | Admitting: Family Medicine

## 2019-01-09 ENCOUNTER — Ambulatory Visit (INDEPENDENT_AMBULATORY_CARE_PROVIDER_SITE_OTHER): Payer: Medicare Other | Admitting: Family Medicine

## 2019-01-09 ENCOUNTER — Ambulatory Visit: Payer: Medicare Other | Admitting: Family Medicine

## 2019-01-09 DIAGNOSIS — F1320 Sedative, hypnotic or anxiolytic dependence, uncomplicated: Secondary | ICD-10-CM

## 2019-01-09 DIAGNOSIS — E279 Disorder of adrenal gland, unspecified: Secondary | ICD-10-CM

## 2019-01-09 DIAGNOSIS — R55 Syncope and collapse: Secondary | ICD-10-CM

## 2019-01-09 DIAGNOSIS — F339 Major depressive disorder, recurrent, unspecified: Secondary | ICD-10-CM

## 2019-01-09 DIAGNOSIS — I700 Atherosclerosis of aorta: Secondary | ICD-10-CM | POA: Diagnosis not present

## 2019-01-09 DIAGNOSIS — J432 Centrilobular emphysema: Secondary | ICD-10-CM

## 2019-01-09 DIAGNOSIS — I739 Peripheral vascular disease, unspecified: Secondary | ICD-10-CM | POA: Diagnosis not present

## 2019-01-09 DIAGNOSIS — I2510 Atherosclerotic heart disease of native coronary artery without angina pectoris: Secondary | ICD-10-CM

## 2019-01-09 DIAGNOSIS — E278 Other specified disorders of adrenal gland: Secondary | ICD-10-CM

## 2019-01-09 NOTE — Assessment & Plan Note (Signed)
Stable during extruded stressful situation at mother's dying.  Continue current medications

## 2019-01-09 NOTE — Assessment & Plan Note (Signed)
Stable on medications

## 2019-01-09 NOTE — Assessment & Plan Note (Signed)
The current medical regimen is effective;  continue present plan and medications.  

## 2019-01-09 NOTE — Progress Notes (Signed)
There were no vitals taken for this visit.   Subjective:    Patient ID: Lindsey Bautista, female    DOB: Jan 10, 1961, 58 y.o.   MRN: [id number]  HPI: Lindsey Bautista is a 58 y.o. female  Mom is dying and stress  Telemedicine using audio/video telecommunications for a synchronous communication visit. Today's visit due to COVID-19 isolation precautions I connected with and verified that I am speaking with the correct person using two identifiers.   I discussed the limitations, risks, security and privacy concerns of performing an evaluation and management service by telecommunication and the availability of in person appointments. I also discussed with the patient that there may be a patient responsible charge related to this service. The patient expressed understanding and agreed to proceed. The patient's location is home. I am at home.  Discussed with patient has been caregiver for her mother who is dying is kept her mother at home with hospice help but with mother's deteriorating condition and falling out of bed has been admitted to the hospice home.  Patient all in all doing well takes occasional clonazepam. Fainting spells have resolved for now with no further fainting spells did have one spell of weakness but attributes that to poor sleep. Depression stable cholesterol stable  Relevant past medical, surgical, family and social history reviewed and updated as indicated. Interim medical history since our last visit reviewed. Allergies and medications reviewed and updated.  Review of Systems  Constitutional: Positive for fatigue. Negative for fever.  Respiratory: Negative.   Cardiovascular: Negative.     Per HPI unless specifically indicated above     Objective:    There were no vitals taken for this visit.  Wt Readings from Last 3 Encounters:  09/02/18 144 lb (65.3 kg)  08/22/18 144 lb (65.3 kg)  07/02/18 144 lb 8 oz (65.5 kg)    Physical Exam  Results for orders placed or  performed in visit on 11/18/18  WET PREP FOR [place], YEAST, CLUE  Result Value Ref Range   Trichomonas Exam Negative Negative   Yeast Exam Positive (A) Negative   Clue Cell Exam Positive (A) Negative  UA/M w/rflx Culture, Routine  Result Value Ref Range   Specific Gravity, UA 1.020 1.005 - 1.030   pH, UA 5.5 5.0 - 7.5   Color, UA Yellow Yellow   Appearance Ur Hazy (A) Clear   Leukocytes,UA Negative Negative   Protein,UA Negative Negative/Trace   Glucose, UA Negative Negative   Ketones, UA Trace (A) Negative   RBC, UA Negative Negative   Bilirubin, UA Negative Negative   Urobilinogen, Ur 0.2 0.2 - 1.0 mg/dL   Nitrite, UA Negative Negative      Assessment & Plan:   Problem List Items Addressed This Visit      Cardiovascular and Mediastinum   Situational syncope    Stable for now      PAD (peripheral artery disease) (HCC)    The current medical regimen is effective;  continue present plan and medications.       Aortic atherosclerosis (HCC)    Stable on medications        Respiratory   COPD (chronic obstructive pulmonary disease) ([place])    The current medical regimen is effective;  continue present plan and medications.         Other   Depression, recurrent ([place])    Stable during extruded stressful situation at mother's dying.  Continue current medications      Benzodiazepine dependence,  continuous ([place])    Stable on current medications      Adrenal nodule (HCC)    No sx        I discussed the assessment and treatment plan with the patient. The patient was provided an opportunity to ask questions and all were answered. The patient agreed with the plan and demonstrated an understanding of the instructions.   The patient was advised to call back or seek an in-person evaluation if the symptoms worsen or if the condition fails to improve as anticipated.   I provided 21+ minutes of time during this encounter.  Follow up plan: Return if symptoms worsen or fail  to improve, for As scheduled.

## 2019-01-09 NOTE — Assessment & Plan Note (Signed)
Stable for now

## 2019-01-09 NOTE — Assessment & Plan Note (Signed)
No sx 

## 2019-01-09 NOTE — Assessment & Plan Note (Signed)
Stable on current medications 

## 2019-01-29 ENCOUNTER — Encounter: Payer: Self-pay | Admitting: General Practice

## 2019-01-29 ENCOUNTER — Telehealth (INDEPENDENT_AMBULATORY_CARE_PROVIDER_SITE_OTHER): Payer: Medicare Other | Admitting: General Practice

## 2019-01-29 ENCOUNTER — Telehealth: Payer: Self-pay

## 2019-01-29 ENCOUNTER — Other Ambulatory Visit: Payer: Self-pay

## 2019-01-29 VITALS — Ht 61.0 in | Wt 140.0 lb

## 2019-01-29 DIAGNOSIS — F32A Depression, unspecified: Secondary | ICD-10-CM

## 2019-01-29 DIAGNOSIS — E782 Mixed hyperlipidemia: Secondary | ICD-10-CM

## 2019-01-29 DIAGNOSIS — I2510 Atherosclerotic heart disease of native coronary artery without angina pectoris: Secondary | ICD-10-CM

## 2019-01-29 DIAGNOSIS — F329 Major depressive disorder, single episode, unspecified: Secondary | ICD-10-CM

## 2019-01-29 DIAGNOSIS — F419 Anxiety disorder, unspecified: Secondary | ICD-10-CM

## 2019-01-29 NOTE — Patient Instructions (Addendum)
It was a pleasure to speak with you on the phone today! Thank you for allowing [place] to continue taking care of your [facility] needs during this time.   Feel free to call as needed for questions and concerns related to your cardiac needs.   Medication Instructions:  Your physician recommends that you continue on your current medications as directed. Please refer to the Current Medication list given to you today.  If you need a refill on your cardiac medications before your next appointment, please call your pharmacy.   Lab work: None ordered  If you have labs (blood work) drawn today and your tests are completely normal, you will receive your results only by: [name] MyChart Message (if you have MyChart) OR . A paper copy in the mail If you have any lab test that is abnormal or we need to change your treatment, we will call you to review the results.  Testing/Procedures: None ordered   Follow-Up: At [facility], you and your health needs are our priority.  As part of our continuing mission to provide you with exceptional heart care, we have created designated Provider Care Teams.  These Care Teams include your primary Cardiologist (physician) and Advanced Practice Providers (APPs -  Physician Assistants and Nurse Practitioners) who all work together to provide you with the care you need, when you need it. You will need a follow up appointment in 1 weeks.  Please see Murray Hodgkins, NP. 02/05/19@ 2:30PM

## 2019-01-29 NOTE — Telephone Encounter (Signed)
Virtual Visit Pre-Appointment Phone Call  "Lindsey Bautista, I am calling you today to discuss your upcoming appointment. We are currently trying to limit exposure to the virus that causes COVID-19 by seeing patients at home rather than in the office."  1. "What is the BEST phone number to call the day of the visit?" - include this in appointment notes  2. Do you have or have access to (through a family member/friend) a smartphone with video capability that we can use for your visit?" a. If yes - list this number in appt notes as cell (if different from BEST phone #) and list the appointment type as a VIDEO visit in appointment notes b. If no - list the appointment type as a PHONE visit in appointment notes  3. Confirm consent - "In the setting of the current Covid19 crisis, you are scheduled for a phone visit with your provider on 01/29/2019 at 3:30PM.  Just as we do with many in-office visits, in order for you to participate in this visit, we must obtain consent.  If you'd like, I can send this to your mychart (if signed up) or email for you to review.  Otherwise, I can obtain your verbal consent now.  All virtual visits are billed to your insurance company just like a normal visit would be.  By agreeing to a virtual visit, we'd like you to understand that the technology does not allow for your provider to perform an examination, and thus may limit your provider's ability to fully assess your condition. If your provider identifies any concerns that need to be evaluated in person, we will make arrangements to do so.  Finally, though the technology is pretty good, we cannot assure that it will always work on either your or our end, and in the setting of a video visit, we may have to convert it to a phone-only visit.  In either situation, we cannot ensure that we have a secure connection.  Are you willing to proceed?" STAFF: Did the patient verbally acknowledge consent to telehealth visit? Document YES/NO  here: YES  4. Advise patient to be prepared - "Two hours prior to your appointment, go ahead and check your blood pressure, pulse, oxygen saturation, and your weight (if you have the equipment to check those) and write them all down. When your visit starts, your provider will ask you for this information. If you have an Apple Watch or Kardia device, please plan to have heart rate information ready on the day of your appointment. Please have a pen and paper handy nearby the day of the visit as well."  5. Give patient instructions for MyChart download to smartphone OR Doximity/[email or web address] as below if video visit (depending on what platform provider is using)  6. Inform patient they will receive a phone call 15 minutes prior to their appointment time (may be from unknown caller ID) so they should be prepared to answer    TELEPHONE CALL NOTE  Lindsey Bautista has been deemed a candidate for a follow-up tele-health visit to limit community exposure during the Covid-19 pandemic. I spoke with the patient via phone to ensure availability of phone/video source, confirm preferred email & phone number, and discuss instructions and expectations.  I reminded Lindsey Bautista to be prepared with any vital sign and/or heart rhythm information that could potentially be obtained via home monitoring, at the time of her visit. I reminded Lindsey Bautista to expect a phone call prior to her visit.  Rene Paci McClain 01/29/2019 11:03 AM    FULL LENGTH CONSENT FOR TELE-HEALTH VISIT   I hereby voluntarily request, consent and authorize [facility] and its employed or contracted physicians, physician assistants, nurse practitioners or other licensed health care professionals (the Practitioner), to provide me with telemedicine health care services (the Services") as deemed necessary by the treating Practitioner. I acknowledge and consent to receive the Services by the Practitioner via telemedicine. I understand that  the telemedicine visit will involve communicating with the Practitioner through live audiovisual communication technology and the disclosure of certain medical information by electronic transmission. I acknowledge that I have been given the opportunity to request an in-person assessment or other available alternative prior to the telemedicine visit and am voluntarily participating in the telemedicine visit.  I understand that I have the right to withhold or withdraw my consent to the use of telemedicine in the course of my care at any time, without affecting my right to future care or treatment, and that the Practitioner or I may terminate the telemedicine visit at any time. I understand that I have the right to inspect all information obtained and/or recorded in the course of the telemedicine visit and may receive copies of available information for a reasonable fee.  I understand that some of the potential risks of receiving the Services via telemedicine include:   Delay or interruption in medical evaluation due to technological equipment failure or disruption;  Information transmitted may not be sufficient (e.g. poor resolution of images) to allow for appropriate medical decision making by the Practitioner; and/or   In rare instances, security protocols could fail, causing a breach of personal health information.  Furthermore, I acknowledge that it is my responsibility to provide information about my medical history, conditions and care that is complete and accurate to the best of my ability. I acknowledge that Practitioner's advice, recommendations, and/or decision may be based on factors not within their control, such as incomplete or inaccurate data provided by me or distortions of diagnostic images or specimens that may result from electronic transmissions. I understand that the practice of medicine is not an exact science and that Practitioner makes no warranties or guarantees regarding treatment  outcomes. I acknowledge that I will receive a copy of this consent concurrently upon execution via email to the email address I last provided but may also request a printed copy by calling the office of [facility].    I understand that my insurance will be billed for this visit.   I have read or had this consent read to me.  I understand the contents of this consent, which adequately explains the benefits and risks of the Services being provided via telemedicine.   I have been provided ample opportunity to ask questions regarding this consent and the Services and have had my questions answered to my satisfaction.  I give my informed consent for the services to be provided through the use of telemedicine in my medical care  By participating in this telemedicine visit I agree to the above.

## 2019-01-29 NOTE — Progress Notes (Signed)
Virtual Visit via Telephone Note   This visit type was conducted due to national recommendations for restrictions regarding the COVID-19 Pandemic (e.g. social distancing) in an effort to limit this patient's exposure and mitigate transmission in our community.  Due to her co-morbid illnesses, this patient is at least at moderate risk for complications without adequate follow up.  This format is felt to be most appropriate for this patient at this time.  The patient did not have access to video technology/had technical difficulties with video requiring transitioning to audio format only (telephone).  All issues noted in this document were discussed and addressed.  No physical exam could be performed with this format.  Please refer to the patient's chart for her  consent to telehealth for [facility]. Evaluation Performed:  Follow-up visit  This visit type was conducted due to national recommendations for restrictions regarding the COVID-19 Pandemic (e.g. social distancing).  This format is felt to be most appropriate for this patient at this time.  All issues noted in this document were discussed and addressed.  No physical exam was performed (except for noted visual exam findings with Video Visits).  Please refer to the patient's chart (MyChart message for video visits and phone note for telephone visits) for the patient's consent to telehealth for [facility]. _____________   Date:  01/29/2019   Patient ID:  Lindsey Bautista, DOB [birth year], MRN [id number] Patient Location:  [address] [place], [place] [address]  Provider location:   [facility]  Primary Care Provider:  [name], MD Primary Cardiologist:  Ida Rogue, MD  Chief Complaint    Follow-up for coronary artery disease  Past Medical History    Past Medical History:  Diagnosis Date  . Atypical chest pain    a. 08/2016 Neg MV.  [name] COPD (chronic obstructive pulmonary disease) ([place])   . Coronary artery  disease, non-occlusive    a.  LHC 06/19/2018: Mid LAD 50% with iFR 0.94  . Depression   . Diastolic dysfunction    a. 10/2016 Echo: EF 60-65%, no rwma, Gr1 DD, mild MR, nl LA size, nl RV fxn.  [name] GERD (gastroesophageal reflux disease)   . Hypotension   . IBS (irritable bowel syndrome)   . Right arm weakness   . Situational syncope   . TIA (transient ischemic attack)    Past Surgical History:  Procedure Laterality Date  . COLONOSCOPY WITH PROPOFOL N/A 11/22/2015   Procedure: COLONOSCOPY WITH PROPOFOL;  Surgeon: Manya Silvas, MD;  Location: [facility] ENDOSCOPY;  Service: Endoscopy;  Laterality: N/A;  . ESOPHAGOGASTRODUODENOSCOPY (EGD) WITH PROPOFOL N/A 11/22/2015   Procedure: ESOPHAGOGASTRODUODENOSCOPY (EGD) WITH PROPOFOL;  Surgeon: Manya Silvas, MD;  Location: [facility] ENDOSCOPY;  Service: Endoscopy;  Laterality: N/A;  . INTRAVASCULAR PRESSURE WIRE/FFR STUDY N/A 06/19/2018   Procedure: INTRAVASCULAR PRESSURE WIRE/FFR STUDY;  Surgeon: Nelva Bush, MD;  Location: [facility] CV LAB;  Service: Cardiovascular;  Laterality: N/A;  . LEFT HEART CATH AND CORONARY ANGIOGRAPHY N/A 06/19/2018   Procedure: LEFT HEART CATH AND CORONARY ANGIOGRAPHY;  Surgeon: Nelva Bush, MD;  Location: [facility] CV LAB;  Service: Cardiovascular;  Laterality: N/A;  . OOPHORECTOMY    . TONSILLECTOMY    . VAGINAL HYSTERECTOMY      Allergies  Allergies  Allergen Reactions  . Penicillin G Anaphylaxis and Other (See Comments)    Has patient had a PCN reaction causing immediate rash, facial/tongue/throat swelling, SOB or lightheadedness with hypotension: [id number]} Has patient had a  PCN reaction causing severe rash involving mucus membranes or skin necrosis: no:[id number]} Has patient had a PCN reaction that required hospitalization no:[id number]} Has patient had a PCN reaction occurring within the last 10 years: [id number]} If all of the above answers are "NO", then may proceed with Cephalosporin use.   .  Iodinated Diagnostic Agents Rash and Nausea And Vomiting    History of Present Illness    Lindsey Bautista is a 58 y.o. female who presents via audio/video conferencing for a telehealth visit today.    PMH: Mild to moderate nonobstructive coronary artery disease (50% mid LAD), last heart cath on 06/19/2018, atypical chest pain, depression, anxiety, chronic dyspnea on exertion, situational syncope, conversion disorder, HLD, history of noncompliance.   Echocardiogram 06/12/2018-LVEF 60 to 36%, grade 1 diastolic dysfunction, and mild MR.  Today Lindsey Bautista states that she has been taking care of her mother who has just passed away.  She states that she has still been able to do housework and indoor chores.  But does state that she has been having intermittent periods of chest pain, the last episode she remembers was before Christmas 2019.  She states that she has occasional dyspnea on exertion.  She states that she has intermittent lower extremity swelling and also states that she has dizziness from time to time.  During her November 2019 visit it was recommended that she start 15 mg of isosorbide mononitrate daily however, she did not initiate the use of this medication.  She has lost 4 pounds since her last recorded weight in January (140 pounds), and she states that she has no appetite and usually only eats 1 meal in the evening.  She expressed concern about her nonobstructed coronary artery disease and wanted reassurance that her heart was okay.  The patient does not have symptoms concerning for COVID-19 infection (fever, chills, cough, or new shortness of breath).   Home Medications    Prior to Admission medications   Medication Sig Start Date End Date Taking? Authorizing Provider  aspirin 81 MG tablet Take 81 mg by mouth daily.   Yes [provider]  clonazePAM (KLONOPIN) 1 MG tablet Take 1 tablet (1 mg total) by mouth 2 (two) times daily. 09/02/18 03/01/19 Yes Crissman, Jeannette How, MD   estradiol (ESTRACE) 1 MG tablet Take 1 tablet (1 mg total) by mouth daily. 09/02/18  Yes Crissman, Jeannette How, MD  FLUoxetine (PROZAC) 20 MG capsule Take 1 capsule (20 mg total) by mouth daily. 09/02/18  Yes [name], MD  atorvastatin (LIPITOR) 80 MG tablet Take 1 tablet (80 mg total) by mouth daily at 6 PM. Patient not taking: Reported on 01/29/2019 06/19/18   Epifanio Lesches, MD  ezetimibe (ZETIA) 10 MG tablet Take 1 tablet (10 mg total) by mouth daily. Patient not taking: Reported on 01/29/2019 09/02/18   [name], MD    Review of Systems      All other systems reviewed and are otherwise negative except as noted above.  Physical Exam    Vital Signs:  Ht 5\' 1"  (1.549 m)   Wt 140 lb (63.5 kg)   BMI 26.45 kg/m    Patient in no acute distress, alert and oriented x3, and unlabored breathing.   Accessory Clinical Findings    ECG personally reviewed by me today -No EKG today.  Assessment & Plan    1.  Nonobstructive coronary artery disease- occasional episodes of chest discomfort Continue 81 mg aspirin daily Continue atorvastatin  80 mg  tablet daily Obtain EKG in the next week Obtain vitals and perform physical exam in the next week.  2.  Hyperlipidemia- LDL 91 (08/2018) Continue atorvastatin 80 mg tablet daily  3.  Anxiety/depression-Mother recently passed away Considered given patient's presentation. Monitored per PCP.  COVID-19 Education: The signs and symptoms of COVID-19 were discussed with the patient and how to seek care for testing (follow up with PCP or arrange E-visit).  The importance of social distancing was discussed today.  Patient Risk:   After full review of this patient's history and clinical status, I feel that he is at least moderate risk for cardiac complications at this time, thus necessitating a telehealth visit sooner than our first available in office visit.  Time:   Today, I have spent 20 minutes with the patient with telehealth  technology discussing medical history, symptoms, and management plan.    Disposition: Follow-up in the office in 1 week  Deberah Pelton, NP 01/29/2019, 4:13 PM

## 2019-01-29 NOTE — Telephone Encounter (Signed)
Call to pt to make f/u appt for 1 week. Appt made for 02/05/19@ 2:30 PM with Ignacia Bayley, NP.   Made pt aware of need to bring medications to next appt.      COVID-19 Pre-Screening Questions:  . In the past 7 to 10 days have you had a cough,  shortness of breath, headache, congestion, fever (100 or greater) body aches, chills, sore throat, or sudden loss of taste or sense of smell? NO . Have you been around anyone with known Covid 19. NO  . Have you been around anyone who is awaiting Covid 19 test results in the past 7 to 10 days? NO . Have you been around anyone who has been exposed to Covid 19, or has mentioned symptoms of Covid 19 within the past 7 to 10 days? NO   If you have any concerns/questions about symptoms patients report during screening (either on the phone or at [facility]). Contact the provider seeing the patient or DOD for further guidance.  If neither are available contact a member of the leadership team.

## 2019-02-05 ENCOUNTER — Other Ambulatory Visit: Payer: Self-pay

## 2019-02-05 ENCOUNTER — Ambulatory Visit (INDEPENDENT_AMBULATORY_CARE_PROVIDER_SITE_OTHER): Payer: Medicare Other | Admitting: Nurse Practitioner

## 2019-02-05 ENCOUNTER — Encounter: Payer: Self-pay | Admitting: Nurse Practitioner

## 2019-02-05 VITALS — BP 128/76 | HR 56 | Ht 61.0 in | Wt 143.8 lb

## 2019-02-05 DIAGNOSIS — R0789 Other chest pain: Secondary | ICD-10-CM | POA: Diagnosis not present

## 2019-02-05 DIAGNOSIS — E782 Mixed hyperlipidemia: Secondary | ICD-10-CM | POA: Diagnosis not present

## 2019-02-05 DIAGNOSIS — R0609 Other forms of dyspnea: Secondary | ICD-10-CM

## 2019-02-05 DIAGNOSIS — I2510 Atherosclerotic heart disease of native coronary artery without angina pectoris: Secondary | ICD-10-CM | POA: Diagnosis not present

## 2019-02-05 DIAGNOSIS — I25110 Atherosclerotic heart disease of native coronary artery with unstable angina pectoris: Secondary | ICD-10-CM | POA: Diagnosis not present

## 2019-02-05 MED ORDER — ATORVASTATIN CALCIUM 80 MG PO TABS: 80.0000 mg | ORAL_TABLET | Freq: Every day | ORAL | 3 refills | Status: DC

## 2019-02-05 NOTE — Patient Instructions (Signed)
Medication Instructions:  Your physician has recommended you make the following change in your medication:  1- RESTART Lipitor Take 1 tablet (80 mg total) by mouth daily at 6 PM If you need a refill on your cardiac medications before your next appointment, please call your pharmacy.   Lab work: Your physician recommends that you return for lab work in: 6 weeks (on or around 03/24/19) at the medical mall. You will need to be fasting.  No appt is needed. Hours are M-F 7AM- 6 PM.  If you have labs (blood work) drawn today and your tests are completely normal, you will receive your results only by: [name] MyChart Message (if you have MyChart) OR . A paper copy in the mail If you have any lab test that is abnormal or we need to change your treatment, we will call you to review the results.  Testing/Procedures: None ordered   Follow-Up: At [facility], you and your health needs are our priority.  As part of our continuing mission to provide you with exceptional heart care, we have created designated Provider Care Teams.  These Care Teams include your primary Cardiologist (physician) and Advanced Practice Providers (APPs -  Physician Assistants and Nurse Practitioners) who all work together to provide you with the care you need, when you need it. You will need a follow up appointment in 4 months.  Please call our office 2 months in advance to schedule this appointment.  You may see Ida Rogue, MD or Murray Hodgkins, NP.

## 2019-02-05 NOTE — Progress Notes (Signed)
Office Visit    Patient Name: Lindsey Bautista Date of Encounter: 02/05/2019  Primary Care Provider:  [name], MD Primary Cardiologist:  Ida Rogue, MD  Chief Complaint    58 year old female with a history of chest pain, nonobstructive CAD, diastolic dysfunction, hyperlipidemia, anxiety, depression, and COPD, who presents for follow-up related to chest pain.  Past Medical History    Past Medical History:  Diagnosis Date  . Atypical chest pain    a. 08/2016 Neg MV.  [name] COPD (chronic obstructive pulmonary disease) ([place])   . Coronary artery disease, non-occlusive    a.  LHC 06/19/2018: Mid LAD 50% with iFR 0.94  . Depression   . Diastolic dysfunction    a. 10/2016 Echo: EF 60-65%, no rwma, Gr1 DD, mild MR, nl LA size, nl RV fxn; b. 05/2018 Echo: EF 60-65%. No rwma. Mild MR. Nl RV fxn.  [name] GERD (gastroesophageal reflux disease)   . Hypotension   . IBS (irritable bowel syndrome)   . Right arm weakness   . Situational syncope   . TIA (transient ischemic attack)    Past Surgical History:  Procedure Laterality Date  . COLONOSCOPY WITH PROPOFOL N/A 11/22/2015   Procedure: COLONOSCOPY WITH PROPOFOL;  Surgeon: Manya Silvas, MD;  Location: [facility] ENDOSCOPY;  Service: Endoscopy;  Laterality: N/A;  . ESOPHAGOGASTRODUODENOSCOPY (EGD) WITH PROPOFOL N/A 11/22/2015   Procedure: ESOPHAGOGASTRODUODENOSCOPY (EGD) WITH PROPOFOL;  Surgeon: Manya Silvas, MD;  Location: [facility] ENDOSCOPY;  Service: Endoscopy;  Laterality: N/A;  . INTRAVASCULAR PRESSURE WIRE/FFR STUDY N/A 06/19/2018   Procedure: INTRAVASCULAR PRESSURE WIRE/FFR STUDY;  Surgeon: Nelva Bush, MD;  Location: [place] CV LAB;  Service: Cardiovascular;  Laterality: N/A;  . LEFT HEART CATH AND CORONARY ANGIOGRAPHY N/A 06/19/2018   Procedure: LEFT HEART CATH AND CORONARY ANGIOGRAPHY;  Surgeon: Nelva Bush, MD;  Location: [place] CV LAB;  Service: Cardiovascular;  Laterality: N/A;  . OOPHORECTOMY    .  TONSILLECTOMY    . VAGINAL HYSTERECTOMY      Allergies  Allergies  Allergen Reactions  . Penicillin G Anaphylaxis and Other (See Comments)    Has patient had a PCN reaction causing immediate rash, facial/tongue/throat swelling, SOB or lightheadedness with hypotension: [id number]} Has patient had a PCN reaction causing severe rash involving mucus membranes or skin necrosis: no:[id number]} Has patient had a PCN reaction that required hospitalization no:[id number]} Has patient had a PCN reaction occurring within the last 10 years: [id number]} If all of the above answers are "NO", then may proceed with Cephalosporin use.   . Iodinated Diagnostic Agents Rash and Nausea And Vomiting    History of Present Illness    58 year old female with the above past medical history including nonobstructive CAD, atypical chest pain, hyperlipidemia, anxiety, depression, COPD, and diastolic dysfunction.  She was evaluated at [facility] in October 2019 with chest pain.  She ruled out and underwent diagnostic catheterization which showed a 50% mid LAD stenosis.  Fractional flow reserve was performed and was normal and thus medical therapy was recommended.  She was initially placed on Imdur therapy but this caused headaches and she discontinued.  She was seen via telemedicine visit last week and reported intermittent chest discomfort since her last visit in the office in November.  She had a particularly bad episode around Christmas but did not require medical attention.  Her mother, who has been chronically ill, recently passed, and in that setting, she noted some increase in chest discomfort as well.  Over the  past 3 months, she has had some increase in dyspnea on exertion as well.  It was felt that she would require an office evaluation and ECG and thus appointment was established for today.  She says that over the past 4 days, she has had a constant low level chest discomfort that is not any worse with deep  breathing, palpation, or position changes.  Over the past 3 months, she has noted some dyspnea on exertion when walking outside but overall, her activity has been limited.  She denies palpitations, PND, orthopnea, dizziness, syncope, edema, or early satiety.  Home Medications    Prior to Admission medications   Medication Sig Start Date End Date Taking? Authorizing Provider  aspirin 81 MG tablet Take 81 mg by mouth daily.   Yes [provider]  clonazePAM (KLONOPIN) 1 MG tablet Take 1 tablet (1 mg total) by mouth 2 (two) times daily. 09/02/18 03/01/19 Yes Criss[facility], Jeannette How, MD  estradiol (ESTRACE) 1 MG tablet Take 1 tablet (1 mg total) by mouth daily. 09/02/18  Yes Crissman, Jeannette How, MD  FLUoxetine (PROZAC) 20 MG capsule Take 1 capsule (20 mg total) by mouth daily. 09/02/18  Yes Crissman, Jeannette How, MD  atorvastatin (LIPITOR) 80 MG tablet Take 1 tablet (80 mg total) by mouth daily at 6 PM. Patient not taking: Reported on 02/05/2019 06/19/18   Epifanio Lesches, MD  ezetimibe (ZETIA) 10 MG tablet Take 1 tablet (10 mg total) by mouth daily. Patient not taking: Reported on 02/05/2019 09/02/18   [name], MD    Review of Systems    Chest discomfort and dyspnea on exertion as outlined above.  She denies palpitations, PND, orthopnea, dizziness, syncope, edema, or early satiety.  All other systems reviewed and are otherwise negative except as noted above.  Physical Exam    VS:  BP 128/76 (BP Location: Left Arm, Patient Position: Sitting, Cuff Size: Normal)   Pulse (!) 56   Ht 5\' 1"  (1.549 m)   Wt 143 lb 12.8 oz (65.2 kg)   SpO2 98%   BMI 27.17 kg/m  , BMI Body mass index is 27.17 kg/m. GEN: Well nourished, well developed, in no acute distress. HEENT: normal. Neck: Supple, no JVD, carotid bruits, or masses. Cardiac: RRR, no murmurs, rubs, or gallops. No clubbing, cyanosis, edema.  Radials/DP/PT 2+ and equal bilaterally.  Respiratory:  Respirations regular and unlabored, clear to  auscultation bilaterally. GI: Soft, nontender, nondistended, BS + x 4. MS: no deformity or atrophy. Skin: warm and dry, no rash. Neuro:  Strength and sensation are intact. Psych: Normal affect.  Accessory Clinical Findings    ECG personally reviewed by me today -sinus bradycardia, 56- no acute changes.  Assessment & Plan    1.  Atypical chest pain/nonobstructive coronary artery disease: Patient underwent diagnostic catheterization October 2019 which showed moderate mid LAD disease with normal fractional flow reserve.  She has been medically managed with aspirin.  She was previously prescribed statin but has not been taking it.  She is willing to resume.  She was seen via telemedicine visit last week and reported intermittent chest discomfort.  On further discussion today, she says this is mostly been occurring in the setting of the recent passing of her mother, and has been somewhat constant in nature.  This does not change with position changes, palpation, or deep breathing.  ECG today is normal.  Given known anatomy and atypical symptoms with normal ECG, I do not feel that further ischemic evaluation is  warranted at this time.  She agrees that situational depression is likely playing a role.  2.  Hyperlipidemia: Previously on Lipitor 80 mg but stopped it.  She says she does not remember why she stopped it but is interested in resuming.  We will send in a prescription and plan to follow-up lipids and LFTs in about 6 weeks.  If LDL still markedly elevated, would add Zetia.  Of note, Zetia was on her list today but she was unaware of ever taking it.  3.  Anxiety and depression: Managed by primary care and likely contributing to some of her symptoms.  She remains on Prozac and Klonopin.  4.  Dyspnea on exertion: She notes that in the setting of reduced activity over the past 3 months, she has had some increase in dyspnea on exertion.  Echo in October showed normal LV function.  She is euvolemic on  exam.  I suspect deconditioning is playing a role and I have encouraged her to increase her activity at home.  5.  Disposition: Follow-up with Dr. Rockey Situ in 4 months or sooner if necessary.  Follow-up lipids and LFTs in 6 weeks given resumption of statin therapy.   Murray Hodgkins, NP 02/05/2019, 3:49 PM

## 2019-02-06 NOTE — Addendum Note (Signed)
Addended by: Alba Destine on: 02/06/2019 12:21 PM   Modules accepted: Orders

## 2019-02-22 ENCOUNTER — Other Ambulatory Visit: Payer: Self-pay | Admitting: Family Medicine

## 2019-02-22 DIAGNOSIS — F419 Anxiety disorder, unspecified: Secondary | ICD-10-CM

## 2019-02-22 NOTE — Telephone Encounter (Signed)
Requested medication (s) are due for refill today: yes  Requested medication (s) are on the active medication list: yes  Last refill:  09/02/18  Future visit scheduled: no  Notes to clinic:  Medication not delegated to NT to refill   Requested Prescriptions  Pending Prescriptions Disp Refills   clonazePAM (KLONOPIN) 1 MG tablet [Pharmacy Med Name: clonazePAM 1 MG Oral Tablet] 60 tablet 0    Sig: Take 1 tablet by mouth twice daily     Not Delegated - Psychiatry:  Anxiolytics/Hypnotics Failed - 02/22/2019 12:38 PM      Failed - This refill cannot be delegated      Failed - Urine Drug Screen completed in last 360 days.      Passed - Valid encounter within last 6 months    Recent Outpatient Visits          1 month ago Aortic atherosclerosis [facility])   [facility], Jeannette How, MD   3 months ago Bacterial vaginosis   [facility] [place], [place] T, NP   5 months ago [place], Jeannette How, MD   6 months ago Upper respiratory tract infection, unspecified type   [place], [place]   8 months ago Conversion disorder with mixed symptoms   [facility], Jeannette How, MD

## 2019-03-10 ENCOUNTER — Ambulatory Visit (INDEPENDENT_AMBULATORY_CARE_PROVIDER_SITE_OTHER): Payer: Medicare Other | Admitting: Family Medicine

## 2019-03-10 ENCOUNTER — Emergency Department: Payer: Medicare Other

## 2019-03-10 ENCOUNTER — Emergency Department
Admission: EM | Admit: 2019-03-10 | Discharge: 2019-03-11 | Disposition: A | Discharge status: Home / Self Care | Payer: Medicare Other | Attending: Emergency Medicine | Admitting: Emergency Medicine

## 2019-03-10 ENCOUNTER — Encounter: Payer: Self-pay | Admitting: Emergency Medicine

## 2019-03-10 ENCOUNTER — Encounter: Payer: Self-pay | Admitting: Family Medicine

## 2019-03-10 ENCOUNTER — Other Ambulatory Visit: Payer: Self-pay

## 2019-03-10 VITALS — BP 128/77 | HR 89 | Temp 98.4°F

## 2019-03-10 DIAGNOSIS — R21 Rash and other nonspecific skin eruption: Secondary | ICD-10-CM | POA: Diagnosis not present

## 2019-03-10 DIAGNOSIS — Z8673 Personal history of transient ischemic attack (TIA), and cerebral infarction without residual deficits: Secondary | ICD-10-CM | POA: Diagnosis not present

## 2019-03-10 DIAGNOSIS — R10A1 Flank pain, right side: Secondary | ICD-10-CM

## 2019-03-10 DIAGNOSIS — J449 Chronic obstructive pulmonary disease, unspecified: Secondary | ICD-10-CM | POA: Insufficient documentation

## 2019-03-10 DIAGNOSIS — R262 Difficulty in walking, not elsewhere classified: Secondary | ICD-10-CM | POA: Diagnosis not present

## 2019-03-10 DIAGNOSIS — F17210 Nicotine dependence, cigarettes, uncomplicated: Secondary | ICD-10-CM | POA: Insufficient documentation

## 2019-03-10 DIAGNOSIS — R109 Unspecified abdominal pain: Secondary | ICD-10-CM

## 2019-03-10 DIAGNOSIS — I700 Atherosclerosis of aorta: Secondary | ICD-10-CM | POA: Diagnosis not present

## 2019-03-10 DIAGNOSIS — B9689 Other specified bacterial agents as the cause of diseases classified elsewhere: Secondary | ICD-10-CM | POA: Diagnosis not present

## 2019-03-10 DIAGNOSIS — R1031 Right lower quadrant pain: Secondary | ICD-10-CM | POA: Insufficient documentation

## 2019-03-10 DIAGNOSIS — R399 Unspecified symptoms and signs involving the genitourinary system: Secondary | ICD-10-CM | POA: Diagnosis not present

## 2019-03-10 DIAGNOSIS — N760 Acute vaginitis: Secondary | ICD-10-CM | POA: Diagnosis not present

## 2019-03-10 DIAGNOSIS — Z7982 Long term (current) use of aspirin: Secondary | ICD-10-CM | POA: Insufficient documentation

## 2019-03-10 DIAGNOSIS — R110 Nausea: Secondary | ICD-10-CM | POA: Diagnosis not present

## 2019-03-10 DIAGNOSIS — I2510 Atherosclerotic heart disease of native coronary artery without angina pectoris: Secondary | ICD-10-CM

## 2019-03-10 DIAGNOSIS — N898 Other specified noninflammatory disorders of vagina: Secondary | ICD-10-CM | POA: Diagnosis not present

## 2019-03-10 DIAGNOSIS — Z79899 Other long term (current) drug therapy: Secondary | ICD-10-CM | POA: Diagnosis not present

## 2019-03-10 DIAGNOSIS — T7840XA Allergy, unspecified, initial encounter: Secondary | ICD-10-CM

## 2019-03-10 DIAGNOSIS — L509 Urticaria, unspecified: Secondary | ICD-10-CM | POA: Diagnosis present

## 2019-03-10 LAB — UA/M W/RFLX CULTURE, ROUTINE
Bilirubin, UA: NEGATIVE
Glucose, UA: NEGATIVE
Ketones, UA: NEGATIVE
Leukocytes,UA: NEGATIVE
Nitrite, UA: NEGATIVE
Protein,UA: NEGATIVE
Specific Gravity, UA: 1.015 (ref 1.005–1.030)
Urobilinogen, Ur: 0.2 mg/dL (ref 0.2–1.0)
pH, UA: 7 (ref 5.0–7.5)

## 2019-03-10 LAB — CBC WITH DIFFERENTIAL/PLATELET
Abs Immature Granulocytes: 0.01 10*3/uL (ref 0.00–0.07)
Basophils Absolute: 0 10*3/uL (ref 0.0–0.1)
Basophils Relative: 1 %
Eosinophils Absolute: 0.1 10*3/uL (ref 0.0–0.5)
Eosinophils Relative: 1 %
HCT: 41 % (ref 36.0–46.0)
Hemoglobin: 13.5 g/dL (ref 12.0–15.0)
Immature Granulocytes: 0 %
Lymphocytes Relative: 40 %
Lymphs Abs: 2.5 10*3/uL (ref 0.7–4.0)
MCH: 29.9 pg (ref 26.0–34.0)
MCHC: 32.9 g/dL (ref 30.0–36.0)
MCV: 90.7 fL (ref 80.0–100.0)
Monocytes Absolute: 0.4 10*3/uL (ref 0.1–1.0)
Monocytes Relative: 7 %
Neutro Abs: 3.1 10*3/uL (ref 1.7–7.7)
Neutrophils Relative %: 51 %
Platelets: 260 10*3/uL (ref 150–400)
RBC: 4.52 MIL/uL (ref 3.87–5.11)
RDW: 13 % (ref 11.5–15.5)
WBC: 6.1 10*3/uL (ref 4.0–10.5)
nRBC: 0 % (ref 0.0–0.2)

## 2019-03-10 LAB — WET PREP FOR TRICH, YEAST, CLUE
Clue Cell Exam: POSITIVE — AB
Trichomonas Exam: NEGATIVE
Yeast Exam: NEGATIVE

## 2019-03-10 LAB — MICROSCOPIC EXAMINATION

## 2019-03-10 LAB — BASIC METABOLIC PANEL
Anion gap: 7 (ref 5–15)
BUN: 14 mg/dL (ref 6–20)
CO2: 27 mmol/L (ref 22–32)
Calcium: 8.8 mg/dL — ABNORMAL LOW (ref 8.9–10.3)
Chloride: 105 mmol/L (ref 98–111)
Creatinine, Ser: 0.78 mg/dL (ref 0.44–1.00)
GFR calc Af Amer: >60 mL/min (ref >60)
GFR calc non Af Amer: >60 mL/min (ref >60)
Glucose, Bld: 110 mg/dL — ABNORMAL HIGH (ref 70–99)
Potassium: 3.6 mmol/L (ref 3.5–5.1)
Sodium: 139 mmol/L (ref 135–145)

## 2019-03-10 MED ORDER — METRONIDAZOLE 500 MG PO TABS: 500.0000 mg | ORAL_TABLET | Freq: Two times a day (BID) | ORAL | 0 refills | Status: DC

## 2019-03-10 MED ORDER — PROMETHAZINE HCL 25 MG PO TABS: 25.0000 mg | ORAL_TABLET | Freq: Three times a day (TID) | ORAL | 0 refills | Status: DC | PRN

## 2019-03-10 MED ORDER — TAMSULOSIN HCL 0.4 MG PO CAPS: 0.4000 mg | ORAL_CAPSULE | Freq: Every day | ORAL | 0 refills | Status: DC

## 2019-03-10 MED ORDER — PREDNISONE 20 MG PO TABS
60.0000 mg | ORAL_TABLET | Freq: Once | ORAL | Status: AC
  Administered 2019-03-10: 60 mg via ORAL
  Filled 2019-03-10: qty 3

## 2019-03-10 MED ORDER — METHYLPREDNISOLONE SODIUM SUCC 125 MG IJ SOLR
125.0000 mg | Freq: Once | INTRAMUSCULAR | Status: AC
  Administered 2019-03-10: 125 mg via INTRAVENOUS
  Filled 2019-03-10: qty 2

## 2019-03-10 NOTE — ED Triage Notes (Signed)
Pt arrived via EMS from home where pt reported having allergic reaction at home post starting Tamulosin first dose today. Pt reported rash bilateral arms, cleared up current. Pt denies SOB but reports chest tightness. Pt has had a total of 50mg  Benadryl prior to arrival.

## 2019-03-10 NOTE — ED Provider Notes (Addendum)
[facility] Emergency Department Provider Note  ____________________________________________   I have reviewed the triage vital signs and the nursing notes. Where available I have reviewed prior notes and, if possible and indicated, outside hospital notes.    HISTORY  Chief Complaint Allergic Reaction    HPI Lindsey Bautista is a 58 y.o. female with multiple medical problems, presents today after taking Tamulosin , shortly thereafter she had hives all over her entire body and her lips felt tingly but she had no throat closure.  She had no significant shortness of breath.  She states she felt tight "everywhere" including her chest.  She denies chest pain.  She did not have any change in her voice.  She received Benadryl from EMS and her symptoms are now resolved.  Her symptoms started 10 minutes after she first took the medication she is never taken it before.   Past Medical History:  Diagnosis Date  . Atypical chest pain    a. 08/2016 Neg MV.  [name] COPD (chronic obstructive pulmonary disease) ([place])   . Coronary artery disease, non-occlusive    a.  LHC 06/19/2018: Mid LAD 50% with iFR 0.94  . Depression   . Diastolic dysfunction    a. 10/2016 Echo: EF 60-65%, no rwma, Gr1 DD, mild MR, nl LA size, nl RV fxn; b. 05/2018 Echo: EF 60-65%. No rwma. Mild MR. Nl RV fxn.  [name] GERD (gastroesophageal reflux disease)   . Hypotension   . IBS (irritable bowel syndrome)   . Right arm weakness   . Situational syncope   . TIA (transient ischemic attack)     Patient Active Problem List   Diagnosis Date Noted  . Bacterial vaginosis 11/18/2018  . Yeast infection 11/18/2018  . Slow transit constipation 11/18/2018  . Coronary artery disease, non-occlusive 09/02/2018  . Fibromyalgia 08/29/2017  . Conversion disorder with mixed symptoms 03/30/2016  . Aortic atherosclerosis ([place]) 12/29/2015  . Adrenal nodule ([place]) 12/29/2015  . Syncope and collapse   . COPD (chronic obstructive  pulmonary disease) ([place]) 12/13/2015  . Chest pain 12/13/2015  . Gastroesophageal reflux disease without esophagitis   . PAD (peripheral artery disease) ([place]) 10/22/2015  . Hyperlipidemia 10/22/2015  . Cervical paraspinal muscle spasm 09/30/2015  . Benzodiazepine dependence, continuous ([place]) 09/08/2015  . Acute anxiety 06/07/2015  . Situational syncope 06/07/2015  . Smoker 04/21/2015  . Depression, recurrent ([place]) 12/19/2013  . Anxiety 12/19/2013    Past Surgical History:  Procedure Laterality Date  . COLONOSCOPY WITH PROPOFOL N/A 11/22/2015   Procedure: COLONOSCOPY WITH PROPOFOL;  Surgeon: Manya Silvas, MD;  Location: [facility] ENDOSCOPY;  Service: Endoscopy;  Laterality: N/A;  . ESOPHAGOGASTRODUODENOSCOPY (EGD) WITH PROPOFOL N/A 11/22/2015   Procedure: ESOPHAGOGASTRODUODENOSCOPY (EGD) WITH PROPOFOL;  Surgeon: Manya Silvas, MD;  Location: [facility] ENDOSCOPY;  Service: Endoscopy;  Laterality: N/A;  . INTRAVASCULAR PRESSURE WIRE/FFR STUDY N/A 06/19/2018   Procedure: INTRAVASCULAR PRESSURE WIRE/FFR STUDY;  Surgeon: Nelva Bush, MD;  Location: [place] CV LAB;  Service: Cardiovascular;  Laterality: N/A;  . LEFT HEART CATH AND CORONARY ANGIOGRAPHY N/A 06/19/2018   Procedure: LEFT HEART CATH AND CORONARY ANGIOGRAPHY;  Surgeon: Nelva Bush, MD;  Location: [place] CV LAB;  Service: Cardiovascular;  Laterality: N/A;  . OOPHORECTOMY    . TONSILLECTOMY    . VAGINAL HYSTERECTOMY      Prior to Admission medications   Medication Sig Start Date End Date Taking? Authorizing Provider  aspirin 81 MG tablet Take 81 mg by mouth daily.  [provider]  atorvastatin (LIPITOR) 80 MG tablet Take 1 tablet (80 mg total) by mouth daily at 6 PM. 02/05/19   Theora Gianotti, NP  clonazePAM [place]) 1 MG tablet Take 1 tablet by mouth twice daily 02/24/19   [name], MD  estradiol (ESTRACE) 1 MG tablet Take 1 tablet (1 mg total) by mouth daily. 09/02/18   [name], MD  FLUoxetine (PROZAC) 20 MG capsule Take 1 capsule (20 mg total) by mouth daily. 09/02/18   [name], MD  metroNIDAZOLE (FLAGYL) 500 MG tablet Take 1 tablet (500 mg total) by mouth 2 (two) times daily. 03/10/19   Volney American, PA-C  promethazine (PHENERGAN) 25 MG tablet Take 1 tablet (25 mg total) by mouth every 8 (eight) hours as needed for nausea or vomiting. 03/10/19   Volney American, PA-C  tamsulosin (FLOMAX) 0.4 MG CAPS capsule Take 1 capsule (0.4 mg total) by mouth daily. 03/10/19   Volney American, PA-C    Allergies Penicillin g and Iodinated diagnostic agents  Family History  Problem Relation Age of Onset  . Bladder Cancer Mother   . Heart failure Mother   . Renal Disease Mother   . COPD Father   . Heart disease Other   . COPD Other   . Stroke Other   . Breast cancer Paternal Aunt 29  . Colon cancer Brother     Social History Social History   Tobacco Use  . Smoking status: Current Every Day Smoker    Packs/day: 1.00    Years: 39.00    Pack years: 39.00    Types: Cigarettes  . Smokeless tobacco: Never Used  Substance Use Topics  . Alcohol use: No    Alcohol/week: 0.0 standard drinks  . Drug use: No    Review of Systems Constitutional: No fever/chills Eyes: No visual changes. ENT: No sore throat. No stiff neck no neck pain Cardiovascular: Denies chest pain. Respiratory: Denies shortness of breath. Gastrointestinal:   no vomiting.  No diarrhea.  No constipation. Genitourinary: Negative for dysuria. Musculoskeletal: Negative lower extremity swelling Skin: + for rash. Neurological: Negative for severe headaches, focal weakness or numbness.   ____________________________________________   PHYSICAL EXAM:  VITAL SIGNS: ED Triage Vitals [03/10/19 2103]  Enc Vitals Group     BP 123/64     Pulse Rate 64     Resp 20     Temp      Temp src      SpO2 92 %     Weight      Height      Head Circumference      Peak Flow       Pain Score      Pain Loc      Pain Edu?      Excl. in [place]?     Constitutional: Alert and oriented. Well appearing and in no acute distress. Eyes: Conjunctivae are normal Head: Atraumatic HEENT: No congestion/rhinnorhea. Mucous membranes are moist.  Oropharynx non-erythematous Neck:   Nontender with no meningismus, no masses, no stridor Cardiovascular: Normal rate, regular rhythm. Grossly normal heart sounds.  Good peripheral circulation. Respiratory: Normal respiratory effort.  No retractions. Lungs CTAB. Abdominal: Soft and nontender. No distention. No guarding no rebound Back:  There is no focal tenderness or step off.  there is no midline tenderness there are no lesions noted. there is no CVA tenderness Musculoskeletal: No lower extremity tenderness, no upper extremity tenderness. No joint effusions,  no DVT signs strong distal pulses no edema Neurologic:  Normal speech and language. No gross focal neurologic deficits are appreciated.  Skin:  Skin is warm, dry and intact. No rash noted. Psychiatric: Mood and affect are normal. Speech and behavior are normal.  ____________________________________________   LABS (all labs ordered are listed, but only abnormal results are displayed)  Labs Reviewed - No data to display  Pertinent labs  results that were available during my care of the patient were reviewed by me and considered in my medical decision making (see chart for details). ____________________________________________  EKG  I personally interpreted any EKGs ordered by me or triage  ____________________________________________  RADIOLOGY  Pertinent labs & imaging results that were available during my care of the patient were reviewed by me and considered in my medical decision making (see chart for details). If possible, patient and/or family made aware of any abnormal findings.  No results found. ____________________________________________     PROCEDURES  Procedure(s) performed: None  Procedures  Critical Care performed: None  ____________________________________________   INITIAL IMPRESSION / ASSESSMENT AND PLAN / ED COURSE  Pertinent labs & imaging results that were available during my care of the patient were reviewed by me and considered in my medical decision making (see chart for details).  No evidence of angioedema no evidence of airway involvement no change in voice no stridor, no rash at this time we will give her Solu-Medrol, and Pepcid and observed here.  Patient did have allergic reaction it appears but now is doing much better.  ----------------------------------------- 10:04 PM on 03/10/2019 -----------------------------------------  No signs or symptoms detected here of emergent allergic reaction however patient states that she is on the Flomax because she was having flank pain on the right side and saw her doctor and they told her that she was having a kidney stone and she wants me to further evaluate the kidney stone.  She does not want pain medication but she has ongoing flank pain and hematuria for the last couple days.  We will obtain CT scan to further evaluate.    ____________________________________________   FINAL CLINICAL IMPRESSION(S) / ED DIAGNOSES  Final diagnoses:  None      This chart was dictated using voice recognition software.  Despite best efforts to proofread,  errors can occur which can change meaning.     Schuyler Amor, MD 03/10/19 2116    Schuyler Amor, MD 03/10/19 2204

## 2019-03-10 NOTE — Progress Notes (Signed)
BP 128/77   Pulse 89   Temp 98.4 F (36.9 C) (Oral)   SpO2 97%    Subjective:    Patient ID: Lindsey Bautista, female    DOB: 29-Aug-1960, 58 y.o.   MRN: [id number]  HPI: Lindsey Bautista is a 58 y.o. female  Chief Complaint  Patient presents with  . Abdominal Pain    Started on Saturday hurts on right side and around to her back. Feels like labor pains per patient 10/10    . This visit was completed via telephone due to the restrictions of the COVID-19 pandemic. All issues as above were discussed and addressed. Physical exam was done as above through visual confirmation on telephone. If it was felt that the patient should be evaluated in the office, they were directed there. The patient verbally consented to this visit. . Location of the patient: home . Location of the provider: home . Those involved with this call:  . Provider: Merrie Roof, PA-C . CMA: Gerda Diss, CMA . Front Desk/Registration: Jill Side  . Time spent on call: 20 minutes on the phone discussing health concerns. 5 minutes total spent in review of patient's record and preparation of their chart. I verified patient identity using two factors (patient name and date of birth). Patient consents verbally to being seen via telemedicine visit today.   2 day of right sided flank pain that she rates 10/10, nausea without vomiting. Denies hematuria, fevers, dysuria, frequency, diarrhea. Thinks she has a kidney stone. Not trying anything for sxs other than OTC pain relievers.   Also having a vaginal odor and discharge ongoing for months. Was treated for BV 4 months ago with temporary relief. Denies itching, rashes, lesions, concern for STIs. Not trying anything for sxs.   Relevant past medical, surgical, family and social history reviewed and updated as indicated. Interim medical history since our last visit reviewed. Allergies and medications reviewed and updated.  Review of Systems  Per HPI unless specifically indicated  above     Objective:    BP 128/77   Pulse 89   Temp 98.4 F (36.9 C) (Oral)   SpO2 97%   Wt Readings from Last 3 Encounters:  02/05/19 143 lb 12.8 oz (65.2 kg)  01/29/19 140 lb (63.5 kg)  09/02/18 144 lb (65.3 kg)    Physical Exam  Unable to perform PE today due to patient lack of access to video technology for visit  Results for orders placed or performed in visit on 11/18/18  WET PREP FOR [place], YEAST, CLUE   Specimen: Vaginal Fluid   VAGINAL FLUI  Result Value Ref Range   Trichomonas Exam Negative Negative   Yeast Exam Positive (A) Negative   Clue Cell Exam Positive (A) Negative  UA/M w/rflx Culture, Routine   Specimen: Urine   URINE  Result Value Ref Range   Specific Gravity, UA 1.020 1.005 - 1.030   pH, UA 5.5 5.0 - 7.5   Color, UA Yellow Yellow   Appearance Ur Hazy (A) Clear   Leukocytes,UA Negative Negative   Protein,UA Negative Negative/Trace   Glucose, UA Negative Negative   Ketones, UA Trace (A) Negative   RBC, UA Negative Negative   Bilirubin, UA Negative Negative   Urobilinogen, Ur 0.2 0.2 - 1.0 mg/dL   Nitrite, UA Negative Negative      Assessment & Plan:   Problem List Items Addressed This Visit    None    Visit Diagnoses    Flank  pain    -  Primary   with hematuria and no evidence of UTI on U/A. Suspect kidney stone. Tx with flomax, push fluids. F/u if worsening or not resolving over next few days   Relevant Orders   UA/M w/rflx Culture, Routine   WET PREP FOR TRICH, YEAST, CLUE   BV (bacterial vaginosis)       Wet prep + for clue cells. Tx with another round of flagyl. Start daily probiotics, good vaginal hygiene reviewed   Relevant Medications   metroNIDAZOLE (FLAGYL) 500 MG tablet   Other Relevant Orders   UA/M w/rflx Culture, Routine   WET PREP FOR TRICH, YEAST, CLUE   Nausea       Tx with phenergan prn. Suspect related to flank pain from suspected kidney stone       Follow up plan: Return if symptoms worsen or fail to improve.

## 2019-03-10 NOTE — ED Provider Notes (Signed)
-----------------------------------------   11:07 PM on 03/10/2019 -----------------------------------------   Assuming care from Dr. Burlene Arnt.  In short, Lindsey Bautista is a 58 y.o. female with a chief complaint of allergic reaction (and right flank pain).  Refer to the original H&P for additional details.  The current plan of care is to follow up CT and reassess.      ----------------------------------------- 2:25 AM on 03/11/2019 -----------------------------------------  Patient has had no more allergic symptoms and she has been resting comfortably and quietly in her room.  CT scan was unremarkable, urinalysis showed no sign of infection.  I updated the patient and answered all of her questions to the best of my ability.  She is comfortable with the plan for discharge.  Given the acute onset and severity of her allergic reaction, even though I encouraged her to not take any more Flomax, I prescribed her a short course of prednisone as well as some EpiPens and gave her my usual recommendations.  She will follow-up with her regular doctor for her ongoing right flank pain but I assured her that at this point there is no evidence of any emergent medical condition.  I gave my usual customary return precautions.   Hinda Kehr, MD 03/11/19 [phone]

## 2019-03-10 NOTE — ED Notes (Signed)
Assumed care ofpatient aox4 in no obvious resp distress. Vss. Reports rash all over 10 minutes after taking new medication today. Awaiting  md eval and plan of care.

## 2019-03-11 DIAGNOSIS — T7840XA Allergy, unspecified, initial encounter: Secondary | ICD-10-CM | POA: Diagnosis not present

## 2019-03-11 LAB — URINALYSIS, COMPLETE (UACMP) WITH MICROSCOPIC
Bilirubin Urine: NEGATIVE
Glucose, UA: NEGATIVE mg/dL
Hgb urine dipstick: NEGATIVE
Ketones, ur: NEGATIVE mg/dL
Leukocytes,Ua: NEGATIVE
Nitrite: NEGATIVE
Protein, ur: NEGATIVE mg/dL
Specific Gravity, Urine: 1.014 (ref 1.005–1.030)
pH: 5 (ref 5.0–8.0)

## 2019-03-11 MED ORDER — PREDNISONE 20 MG PO TABS: 40.0000 mg | ORAL_TABLET | Freq: Every day | ORAL | 0 refills | Status: AC

## 2019-03-11 MED ORDER — EPINEPHRINE 0.3 MG/0.3ML IJ SOAJ: 0.3000 mg | Freq: Once | INTRAMUSCULAR | 0 refills | Status: AC

## 2019-03-11 NOTE — Discharge Instructions (Signed)
You have been seen in the Emergency Department (ED) today for an allergic reaction.  You have been stable throughout your stay in the Emergency Department.  Please take your medications as prescribed and follow up with your doctor as indicated.  You should also take over-the-counter Benadryl around the clock for the next three days according to the dosing instructions on the package.  Please keep your Epi-Pen with you at all times and use it if experience shortness of breath or difficulty breathing or if you believe you are having a severe allergic reaction.  If you use the Epi-Pen, though, please call [phone] afterwards or go immediately to your nearest Emergency Department.  Your CT scan did not demonstrate any kidney stones at this time and there was no evidence of infection on your urinalysis.  Please follow up with your regular doctor regarding your on-going right flank pain.  Return to the Emergency Department (ED) if you experience any worsening or new symptoms that concern you.

## 2019-03-12 ENCOUNTER — Telehealth: Payer: Self-pay | Admitting: Family Medicine

## 2019-03-12 MED ORDER — TRAMADOL HCL 50 MG PO TABS: 50.0000 mg | ORAL_TABLET | Freq: Three times a day (TID) | ORAL | 0 refills | Status: DC | PRN

## 2019-03-12 NOTE — Telephone Encounter (Signed)
Pt schedule for in office visit.

## 2019-03-12 NOTE — Telephone Encounter (Signed)
Per record review presented to ER for allergic reaction to flomax. Flomax taken off chart, and added to allergy list. Please see how she's feeling. Looks like they started her on some home prednisone after CT abdomen pelvis negative for kidney stones or other acute abnormalities in the ER yesterday  Copied from [place] [id number]. Topic: General - Other >> Mar 12, 2019 11:44 AM Jodie Echevaria wrote: Reason for CRM: Patient called to ask Darrick Meigs to give her a call in reference to her and the pain she is still feeling. She stated that she was seen at the hospital but they saw no sign of a kidney stone and need a call back ASAP.  Ph# [phone]

## 2019-03-12 NOTE — Telephone Encounter (Signed)
Needs in person evaluation if her sxs aren't improving. Will send over tramadol for prn pain relief. Avoid taking alongside klonopin due to sedation risks

## 2019-03-12 NOTE — Telephone Encounter (Signed)
Spoke with patient. Very upset and crying while on the phone. Patient states pain is still 10/10 and she is still distended. Patient verbalized concern for bladder or other cancer, as mother has just passed from bladder cancer and states the symptoms are very similar. Pt would like to know what she should do now for further evaluation and pain relief. Please advise.

## 2019-03-13 ENCOUNTER — Ambulatory Visit: Payer: Self-pay | Admitting: Family Medicine

## 2019-03-13 ENCOUNTER — Encounter: Payer: Self-pay | Admitting: Family Medicine

## 2019-03-13 ENCOUNTER — Ambulatory Visit (INDEPENDENT_AMBULATORY_CARE_PROVIDER_SITE_OTHER): Payer: Medicare Other | Admitting: Family Medicine

## 2019-03-13 ENCOUNTER — Other Ambulatory Visit: Payer: Self-pay

## 2019-03-13 VITALS — BP 114/58 | HR 64 | Temp 97.6°F | Ht 61.0 in | Wt 140.0 lb

## 2019-03-13 DIAGNOSIS — I2510 Atherosclerotic heart disease of native coronary artery without angina pectoris: Secondary | ICD-10-CM

## 2019-03-13 DIAGNOSIS — R109 Unspecified abdominal pain: Secondary | ICD-10-CM

## 2019-03-13 MED ORDER — POLYETHYLENE GLYCOL 3350 17 GM/SCOOP PO POWD: 17.0000 g | Freq: Two times a day (BID) | ORAL | 1 refills | Status: DC | PRN

## 2019-03-13 NOTE — Progress Notes (Signed)
BP (!) 114/58 (BP Location: Left Arm, Patient Position: Sitting, Cuff Size: Normal)   Pulse 64   Temp 97.6 F (36.4 C) (Oral)   Ht 5\' 1"  (1.549 m)   Wt 140 lb (63.5 kg)   SpO2 97%   BMI 26.45 kg/m    Subjective:    Patient ID: Lindsey Bautista, female    DOB: 1961-05-12, 58 y.o.   MRN: [id number]  HPI: Lindsey Bautista is a 58 y.o. female  Chief Complaint  Patient presents with  . Flank Pain    Ongoing 5 days, pain keeps worsening. Right side. Did not pick up tramadol, patient states medication is ineffective.   Lindsey Bautista presents today for evaluation of flank pain. She has been having some pain in her side for about 5 days. There was a concern for a kidney stone 3 days ago. She was started on flomax- didn't feel well with it, had some swelling and hives with it. She went to the ER for the allergic reaction to flomax. At that time, she had a CT of her abdomen which did not show any abdominal pathology, but did show a large amount of stool. She is not feeling any better. Hasn't had a BM in about 5 days. She denies any rashes, no diarrhea, no burning when she pees. She is otherwise feeling OK. No other concerns or complaints at this time.  Relevant past medical, surgical, family and social history reviewed and updated as indicated. Interim medical history since our last visit reviewed. Allergies and medications reviewed and updated.  Review of Systems  Constitutional: Negative.   HENT: Negative.   Respiratory: Negative.   Cardiovascular: Negative.   Gastrointestinal: Positive for abdominal pain and constipation. Negative for abdominal distention, anal bleeding, blood in stool, diarrhea, nausea, rectal pain and vomiting.  Genitourinary: Positive for flank pain. Negative for decreased urine volume, difficulty urinating, dyspareunia, dysuria, enuresis, frequency, genital sores, hematuria, menstrual problem, pelvic pain, urgency, vaginal bleeding, vaginal discharge and vaginal pain.  Skin:  Negative.   Psychiatric/Behavioral: Negative.     Per HPI unless specifically indicated above     Objective:    BP (!) 114/58 (BP Location: Left Arm, Patient Position: Sitting, Cuff Size: Normal)   Pulse 64   Temp 97.6 F (36.4 C) (Oral)   Ht 5\' 1"  (1.549 m)   Wt 140 lb (63.5 kg)   SpO2 97%   BMI 26.45 kg/m   Wt Readings from Last 3 Encounters:  03/13/19 140 lb (63.5 kg)  02/05/19 143 lb 12.8 oz (65.2 kg)  01/29/19 140 lb (63.5 kg)    Physical Exam Vitals signs and nursing note reviewed.  Constitutional:      General: She is not in acute distress.    Appearance: Normal appearance. She is not ill-appearing, toxic-appearing or diaphoretic.  HENT:     Head: Normocephalic and atraumatic.     Right Ear: External ear normal.     Left Ear: External ear normal.     Nose: Nose normal.     Mouth/Throat:     Mouth: Mucous membranes are moist.     Pharynx: Oropharynx is clear.  Eyes:     General: No scleral icterus.       Right eye: No discharge.        Left eye: No discharge.     Extraocular Movements: Extraocular movements intact.     Conjunctiva/sclera: Conjunctivae normal.     Pupils: Pupils are equal, round, and reactive to light.  Neck:     Musculoskeletal: Normal range of motion and neck supple.  Cardiovascular:     Rate and Rhythm: Normal rate and regular rhythm.     Pulses: Normal pulses.     Heart sounds: Normal heart sounds. No murmur. No friction rub. No gallop.   Pulmonary:     Effort: Pulmonary effort is normal. No respiratory distress.     Breath sounds: Normal breath sounds. No stridor. No wheezing, rhonchi or rales.  Chest:     Chest wall: No tenderness.  Abdominal:     General: Abdomen is flat. Bowel sounds are normal. There is no distension.     Palpations: Abdomen is soft. There is no mass.     Tenderness: There is no abdominal tenderness. There is no right CVA tenderness, left CVA tenderness, guarding or rebound.     Hernia: No hernia is present.   Musculoskeletal: Normal range of motion.  Skin:    General: Skin is warm and dry.     Capillary Refill: Capillary refill takes less than 2 seconds.     Coloration: Skin is not jaundiced or pale.     Findings: No bruising, erythema, lesion or rash.  Neurological:     General: No focal deficit present.     Mental Status: She is alert and oriented to person, place, and time. Mental status is at baseline.  Psychiatric:        Mood and Affect: Mood normal.        Behavior: Behavior normal.        Thought Content: Thought content normal.        Judgment: Judgment normal.     Results for orders placed or performed in visit on 03/13/19  Microscopic Examination   URINE  Result Value Ref Range   WBC, UA 0-5 0 - 5 /hpf   RBC 0-2 0 - 2 /hpf   Epithelial Cells (non renal) 0-10 0 - 10 /hpf   Bacteria, UA Many (A) None seen/Few  Urine Culture, Reflex   URINE  Result Value Ref Range   Urine Culture, Routine Final report    Organism ID, Bacteria Comment   UA/M w/rflx Culture, Routine   Specimen: Urine   URINE  Result Value Ref Range   Specific Gravity, UA 1.020 1.005 - 1.030   pH, UA 6.0 5.0 - 7.5   Color, UA Yellow Yellow   Appearance Ur Clear Clear   Leukocytes,UA Trace (A) Negative   Protein,UA Negative Negative/Trace   Glucose, UA Negative Negative   Ketones, UA Negative Negative   RBC, UA Trace (A) Negative   Bilirubin, UA Negative Negative   Urobilinogen, Ur 0.2 0.2 - 1.0 mg/dL   Nitrite, UA Negative Negative   Microscopic Examination See below:    Urinalysis Reflex Comment       Assessment & Plan:   Problem List Items Addressed This Visit    None    Visit Diagnoses    Flank pain    -  Primary   UA clear. CT shows no sign of kidney stone, but a lot of stool. Will get her cleared out and recheck in a few days. Call if not getting better or worse.    Relevant Orders   UA/M w/rflx Culture, Routine (Completed)       Follow up plan: Return Mpnday or Tuesday- virtual-  recheck pain.

## 2019-03-15 LAB — UA/M W/RFLX CULTURE, ROUTINE
Bilirubin, UA: NEGATIVE
Glucose, UA: NEGATIVE
Ketones, UA: NEGATIVE
Nitrite, UA: NEGATIVE
Protein,UA: NEGATIVE
Specific Gravity, UA: 1.02 (ref 1.005–1.030)
Urobilinogen, Ur: 0.2 mg/dL (ref 0.2–1.0)
pH, UA: 6 (ref 5.0–7.5)

## 2019-03-15 LAB — URINE CULTURE, REFLEX

## 2019-03-15 LAB — MICROSCOPIC EXAMINATION

## 2019-03-17 ENCOUNTER — Ambulatory Visit: Payer: Medicare Other | Admitting: Family Medicine

## 2019-04-10 ENCOUNTER — Other Ambulatory Visit: Payer: Self-pay | Admitting: Family Medicine

## 2019-04-16 ENCOUNTER — Other Ambulatory Visit: Payer: Self-pay | Admitting: Family Medicine

## 2019-04-16 ENCOUNTER — Ambulatory Visit (INDEPENDENT_AMBULATORY_CARE_PROVIDER_SITE_OTHER): Payer: Medicare Other | Admitting: Family Medicine

## 2019-04-16 ENCOUNTER — Encounter: Payer: Self-pay | Admitting: Family Medicine

## 2019-04-16 ENCOUNTER — Other Ambulatory Visit: Payer: Self-pay

## 2019-04-16 DIAGNOSIS — Z7189 Other specified counseling: Secondary | ICD-10-CM | POA: Diagnosis not present

## 2019-04-16 DIAGNOSIS — N644 Mastodynia: Secondary | ICD-10-CM

## 2019-04-16 DIAGNOSIS — K5901 Slow transit constipation: Secondary | ICD-10-CM

## 2019-04-16 DIAGNOSIS — F339 Major depressive disorder, recurrent, unspecified: Secondary | ICD-10-CM

## 2019-04-16 DIAGNOSIS — F419 Anxiety disorder, unspecified: Secondary | ICD-10-CM

## 2019-04-16 DIAGNOSIS — I2510 Atherosclerotic heart disease of native coronary artery without angina pectoris: Secondary | ICD-10-CM

## 2019-04-16 MED ORDER — CLONAZEPAM 1 MG PO TABS: 1.0000 mg | ORAL_TABLET | Freq: Two times a day (BID) | ORAL | 1 refills | Status: DC

## 2019-04-16 MED ORDER — FLUOXETINE HCL 20 MG PO CAPS: 20.0000 mg | ORAL_CAPSULE | Freq: Every day | ORAL | 2 refills | Status: DC

## 2019-04-16 MED ORDER — POLYETHYLENE GLYCOL 3350 17 GM/SCOOP PO POWD: 17.0000 g | Freq: Two times a day (BID) | ORAL | 1 refills | Status: DC | PRN

## 2019-04-16 MED ORDER — NITROGLYCERIN 0.4 MG SL SUBL: 0.4000 mg | SUBLINGUAL_TABLET | SUBLINGUAL | 3 refills | Status: DC | PRN

## 2019-04-16 MED ORDER — ESTRADIOL 1 MG PO TABS: 1.0000 mg | ORAL_TABLET | Freq: Every day | ORAL | 1 refills | Status: DC

## 2019-04-16 MED ORDER — SULFAMETHOXAZOLE-TRIMETHOPRIM 800-160 MG PO TABS: 1.0000 | ORAL_TABLET | Freq: Two times a day (BID) | ORAL | 0 refills | Status: DC

## 2019-04-16 NOTE — Assessment & Plan Note (Signed)
The current medical regimen is effective;  continue present plan and medications.  

## 2019-04-16 NOTE — Assessment & Plan Note (Signed)
Still using occasional MiraLAX

## 2019-04-16 NOTE — Assessment & Plan Note (Signed)
A voluntary discussion about advanced care planning including explanation and discussion of advanced directives was extentively discussed with the patient.  Explained about the healthcare proxy and living will was reviewed and packet with forms with expiration of how to fill them out was given.  Time spent: Encounter 16+ min individuals present: Patient 

## 2019-04-16 NOTE — Assessment & Plan Note (Signed)
Due to ongoing breast pain symptoms will order diagnostic mammogram and ultrasound

## 2019-04-16 NOTE — Assessment & Plan Note (Signed)
Continued anxiety issues especially made worse with mother's recent passing and disagreements with sister

## 2019-04-16 NOTE — Progress Notes (Signed)
There were no vitals taken for this visit.   Subjective:    Patient ID: Lindsey Bautista, female    DOB: 20-Nov-1960, 58 y.o.   MRN: [id number]  HPI: Lindsey Bautista is a 58 y.o. female  Med check Patient with UTI symptoms.  We will go ahead and treat. Reviewed depression which is stable along with coronary artery disease 50% blockage and taking cholesterol medicine without problems. Constipation still using occasional MiraLAX. Anxiety is patient's biggest concern with continued issues over passing of mother and disagreements with her sister. Patient also with ongoing bilateral breast pain discomfort nipple tenderness this is bilateral maybe left is a little greater than right patient is contacted the mammogram center and they recommended diagnostic mammograms with ultrasound which is ordered.  Relevant past medical, surgical, family and social history reviewed and updated as indicated. Interim medical history since our last visit reviewed. Allergies and medications reviewed and updated.  Review of Systems  Constitutional: Negative.   HENT: Negative.   Eyes: Negative.   Respiratory: Negative.   Cardiovascular: Negative.   Gastrointestinal: Negative.   Endocrine: Negative.   Genitourinary: Negative.   Musculoskeletal: Negative.   Skin: Negative.   Allergic/Immunologic: Negative.   Neurological: Negative.   Hematological: Negative.   Psychiatric/Behavioral: Negative.     Per HPI unless specifically indicated above     Objective:    There were no vitals taken for this visit.  Wt Readings from Last 3 Encounters:  03/13/19 140 lb (63.5 kg)  02/05/19 143 lb 12.8 oz (65.2 kg)  01/29/19 140 lb (63.5 kg)    Physical Exam  Results for orders placed or performed in visit on 03/13/19  Microscopic Examination   URINE  Result Value Ref Range   WBC, UA 0-5 0 - 5 /hpf   RBC 0-2 0 - 2 /hpf   Epithelial Cells (non renal) 0-10 0 - 10 /hpf   Bacteria, UA Many (A) None seen/Few   Urine Culture, Reflex   URINE  Result Value Ref Range   Urine Culture, Routine Final report    Organism ID, Bacteria Comment   UA/M w/rflx Culture, Routine   Specimen: Urine   URINE  Result Value Ref Range   Specific Gravity, UA 1.020 1.005 - 1.030   pH, UA 6.0 5.0 - 7.5   Color, UA Yellow Yellow   Appearance Ur Clear Clear   Leukocytes,UA Trace (A) Negative   Protein,UA Negative Negative/Trace   Glucose, UA Negative Negative   Ketones, UA Negative Negative   RBC, UA Trace (A) Negative   Bilirubin, UA Negative Negative   Urobilinogen, Ur 0.2 0.2 - 1.0 mg/dL   Nitrite, UA Negative Negative   Microscopic Examination See below:    Urinalysis Reflex Comment       Assessment & Plan:   Problem List Items Addressed This Visit      Cardiovascular and Mediastinum   Coronary artery disease, non-occlusive    The current medical regimen is effective;  continue present plan and medications.         Digestive   Slow transit constipation    Still using occasional MiraLAX        Other   Acute anxiety    Continued anxiety issues especially made worse with mother's recent passing and disagreements with sister      Depression, recurrent ([place])    The current medical regimen is effective;  continue present plan and medications.       Breast pain  Due to ongoing breast pain symptoms will order diagnostic mammogram and ultrasound      Anxiety   Advance care planning    A voluntary discussion about advanced care planning including explanation and discussion of advanced directives was extentively discussed with the patient.  Explained about the healthcare proxy and living will was reviewed and packet with forms with expiration of how to fill them out was given.  Time spent: Encounter 16+ min individuals present: Patient          Follow up plan: Return for Physical Exam, has apt.

## 2019-04-23 ENCOUNTER — Ambulatory Visit
Admission: RE | Admit: 2019-04-23 | Discharge: 2019-04-23 | Disposition: A | Discharge status: Home / Self Care | Payer: Medicare Other | Source: Ambulatory Visit | Attending: Family Medicine | Admitting: Family Medicine

## 2019-04-23 DIAGNOSIS — N644 Mastodynia: Secondary | ICD-10-CM

## 2019-04-23 DIAGNOSIS — R922 Inconclusive mammogram: Secondary | ICD-10-CM | POA: Diagnosis not present

## 2019-05-06 ENCOUNTER — Other Ambulatory Visit: Payer: Self-pay

## 2019-05-06 DIAGNOSIS — R6889 Other general symptoms and signs: Secondary | ICD-10-CM | POA: Diagnosis not present

## 2019-05-06 DIAGNOSIS — Z20822 Contact with and (suspected) exposure to covid-19: Secondary | ICD-10-CM

## 2019-05-08 ENCOUNTER — Telehealth: Payer: Self-pay | Admitting: General Practice

## 2019-05-08 LAB — NOVEL CORONAVIRUS, NAA: SARS-CoV-2, NAA: NOT DETECTED

## 2019-05-08 NOTE — Telephone Encounter (Signed)
Negative COVID results given. Patient results "NOT Detected." Caller expressed understanding. ° °

## 2019-05-09 ENCOUNTER — Ambulatory Visit (INDEPENDENT_AMBULATORY_CARE_PROVIDER_SITE_OTHER): Payer: Medicare Other | Admitting: Nurse Practitioner

## 2019-05-09 ENCOUNTER — Encounter: Payer: Self-pay | Admitting: Nurse Practitioner

## 2019-05-09 ENCOUNTER — Emergency Department
Admission: EM | Admit: 2019-05-09 | Discharge: 2019-05-09 | Disposition: A | Discharge status: Home / Self Care | Payer: Medicare Other | Attending: Emergency Medicine | Admitting: Emergency Medicine

## 2019-05-09 ENCOUNTER — Other Ambulatory Visit: Payer: Self-pay

## 2019-05-09 VITALS — Wt 140.0 lb

## 2019-05-09 DIAGNOSIS — F17210 Nicotine dependence, cigarettes, uncomplicated: Secondary | ICD-10-CM | POA: Diagnosis not present

## 2019-05-09 DIAGNOSIS — J449 Chronic obstructive pulmonary disease, unspecified: Secondary | ICD-10-CM | POA: Diagnosis not present

## 2019-05-09 DIAGNOSIS — T782XXA Anaphylactic shock, unspecified, initial encounter: Secondary | ICD-10-CM

## 2019-05-09 DIAGNOSIS — R0789 Other chest pain: Secondary | ICD-10-CM | POA: Diagnosis not present

## 2019-05-09 DIAGNOSIS — I259 Chronic ischemic heart disease, unspecified: Secondary | ICD-10-CM | POA: Diagnosis not present

## 2019-05-09 DIAGNOSIS — Z7982 Long term (current) use of aspirin: Secondary | ICD-10-CM | POA: Diagnosis not present

## 2019-05-09 DIAGNOSIS — L509 Urticaria, unspecified: Secondary | ICD-10-CM | POA: Diagnosis not present

## 2019-05-09 DIAGNOSIS — R600 Localized edema: Secondary | ICD-10-CM | POA: Diagnosis present

## 2019-05-09 DIAGNOSIS — R8290 Unspecified abnormal findings in urine: Secondary | ICD-10-CM | POA: Insufficient documentation

## 2019-05-09 DIAGNOSIS — T7840XA Allergy, unspecified, initial encounter: Secondary | ICD-10-CM | POA: Diagnosis not present

## 2019-05-09 DIAGNOSIS — I2510 Atherosclerotic heart disease of native coronary artery without angina pectoris: Secondary | ICD-10-CM

## 2019-05-09 LAB — CBC WITH DIFFERENTIAL/PLATELET
Abs Immature Granulocytes: 0.04 10*3/uL (ref 0.00–0.07)
Basophils Absolute: 0.1 10*3/uL (ref 0.0–0.1)
Basophils Relative: 1 %
Eosinophils Absolute: 0.2 10*3/uL (ref 0.0–0.5)
Eosinophils Relative: 2 %
HCT: 42.4 % (ref 36.0–46.0)
Hemoglobin: 14 g/dL (ref 12.0–15.0)
Immature Granulocytes: 0 %
Lymphocytes Relative: 57 %
Lymphs Abs: 5.5 10*3/uL — ABNORMAL HIGH (ref 0.7–4.0)
MCH: 30.3 pg (ref 26.0–34.0)
MCHC: 33 g/dL (ref 30.0–36.0)
MCV: 91.8 fL (ref 80.0–100.0)
Monocytes Absolute: 0.6 10*3/uL (ref 0.1–1.0)
Monocytes Relative: 6 %
Neutro Abs: 3.4 10*3/uL (ref 1.7–7.7)
Neutrophils Relative %: 34 %
Platelets: 320 10*3/uL (ref 150–400)
RBC: 4.62 MIL/uL (ref 3.87–5.11)
RDW: 12.9 % (ref 11.5–15.5)
WBC: 9.8 10*3/uL (ref 4.0–10.5)
nRBC: 0 % (ref 0.0–0.2)

## 2019-05-09 LAB — BASIC METABOLIC PANEL
Anion gap: 12 (ref 5–15)
BUN: 9 mg/dL (ref 6–20)
CO2: 23 mmol/L (ref 22–32)
Calcium: 9 mg/dL (ref 8.9–10.3)
Chloride: 103 mmol/L (ref 98–111)
Creatinine, Ser: 0.77 mg/dL (ref 0.44–1.00)
GFR calc Af Amer: >60 mL/min (ref >60)
GFR calc non Af Amer: >60 mL/min (ref >60)
Glucose, Bld: 147 mg/dL — ABNORMAL HIGH (ref 70–99)
Potassium: 3.2 mmol/L — ABNORMAL LOW (ref 3.5–5.1)
Sodium: 138 mmol/L (ref 135–145)

## 2019-05-09 MED ORDER — POTASSIUM CHLORIDE CRYS ER 20 MEQ PO TBCR
40.0000 meq | EXTENDED_RELEASE_TABLET | Freq: Once | ORAL | Status: AC
  Administered 2019-05-09: 40 meq via ORAL
  Filled 2019-05-09: qty 2

## 2019-05-09 MED ORDER — DIPHENHYDRAMINE HCL 25 MG PO TABS: 25.0000 mg | ORAL_TABLET | Freq: Two times a day (BID) | ORAL | 0 refills | Status: DC

## 2019-05-09 MED ORDER — METRONIDAZOLE 500 MG PO TABS: 500.0000 mg | ORAL_TABLET | Freq: Two times a day (BID) | ORAL | 0 refills | Status: DC

## 2019-05-09 MED ORDER — LORAZEPAM 2 MG/ML IJ SOLN: 0.5000 mg | Freq: Once | INTRAMUSCULAR | Status: DC

## 2019-05-09 MED ORDER — CLINDAMYCIN HCL 300 MG PO CAPS: 300.0000 mg | ORAL_CAPSULE | Freq: Two times a day (BID) | ORAL | 0 refills | Status: DC

## 2019-05-09 MED ORDER — FAMOTIDINE IN NACL 20-0.9 MG/50ML-% IV SOLN
20.0000 mg | Freq: Once | INTRAVENOUS | Status: AC
  Administered 2019-05-09: 20 mg via INTRAVENOUS
  Filled 2019-05-09: qty 50

## 2019-05-09 MED ORDER — EPINEPHRINE 0.3 MG/0.3ML IJ SOAJ: 0.3000 mg | INTRAMUSCULAR | 0 refills | Status: DC | PRN

## 2019-05-09 MED ORDER — FAMOTIDINE 20 MG PO TABS: 20.0000 mg | ORAL_TABLET | Freq: Two times a day (BID) | ORAL | 0 refills | Status: DC

## 2019-05-09 MED ORDER — PREDNISONE 20 MG PO TABS: 40.0000 mg | ORAL_TABLET | Freq: Every day | ORAL | 0 refills | Status: AC

## 2019-05-09 MED ORDER — SODIUM CHLORIDE 0.9 % IV BOLUS
1000.0000 mL | Freq: Once | INTRAVENOUS | Status: AC
  Administered 2019-05-09: 21:00:00 1000 mL via INTRAVENOUS

## 2019-05-09 MED ORDER — METHYLPREDNISOLONE SODIUM SUCC 125 MG IJ SOLR
125.0000 mg | Freq: Once | INTRAMUSCULAR | Status: AC
  Administered 2019-05-09: 125 mg via INTRAVENOUS

## 2019-05-09 NOTE — Assessment & Plan Note (Signed)
Unable to come into office today due to URI symptoms.  Suspect this odor is another bacterial infection, has had a few episodes of BV past months.  Will send in script for Flagyl.  Have instructed patient on proper hygiene techniques and discussed frequent infections with aging in females.  Recommend if worsening symptoms over weekend to be seen immediately in UC or ER.  She has scheduled appointment Wednesday for physical.

## 2019-05-09 NOTE — Progress Notes (Signed)
Wt 140 lb (63.5 kg)    BMI 26.45 kg/m    Subjective:    Patient ID: Lindsey Bautista, female    DOB: [birth year], 58 y.o.   MRN: [id number]  HPI: Lindsey Bautista is a 57 y.o. female  Chief Complaint  Patient presents with   Urinary Frequency    strong smell urine. symptoms started about 2 weeks ago   Abdominal Pain    lower abdomen     This visit was completed via telephone due to the restrictions of the COVID-19 pandemic. All issues as above were discussed and addressed but no physical exam was performed. If it was felt that the patient should be evaluated in the office, they were directed there. The patient verbally consented to this visit. Patient was unable to complete an audio/visual visit due to Lack of equipment. Due to the catastrophic nature of the COVID-19 pandemic, this visit was done through audio contact only.  Location of the patient: home  Location of the provider: work  Those involved with this call:   Provider: Marnee Guarneri, DNP  CMA: Lesle Chris, [place] Desk/Registration: Jill Side   Time spent on call: 15 minutes on the phone discussing health concerns. 10 minutes total spent in review of patient's record and preparation of their chart.   I verified patient identity using two factors (patient name and date of birth). Patient consents verbally to being seen via telemedicine visit today.    URINARY SYMPTOMS Recently treated for BV 03/10/19 with Flagyl and had issues with flank pain, suspected kidney stone during that visit and she was treated with Flomax.  Went to ER later on 03/10/19 for allergic reaction Flomax.  Return visit 03/13/19 a CT was performed with no kidney stone noted, but a lot of stool.  Today she feels she has "another UTI and another bacteria infection". States she sits down and smells and odor, "not fishy, but just an odor".  Was treated for BV on 11/18/18.  Reports that Dr. Jeananne Rama sent her in some Bactrim during their last visit  04/16/2019, but she did not take it because the pharmacist told her "it could cause kidney stones".  States her mother just passed away from bladder cancer and now her mother's sister has been diagnosed with bladder cancer.  At her upcoming physical she wishes to discuss referral as she is concerned it is hereditary, she read this online.  Can not come into office today due to current URI symptoms with negative Covid testing.   Dysuria: no Urinary frequency: no Urgency: no Small volume voids: no Symptom severity: no Urinary incontinence: no Foul odor: yes Hematuria: no Abdominal pain: yes Back pain: no Suprapubic pain/pressure: no Flank pain: no Fever:  no Vomiting: no Relief with cranberry juice: none taken Relief with pyridium: none taken Status: stable Previous urinary tract infection: no Recurrent urinary tract infection: no Sexual activity: not sexually active Treatments attempted: increasing fluids   Relevant past medical, surgical, family and social history reviewed and updated as indicated. Interim medical history since our last visit reviewed. Allergies and medications reviewed and updated.  Review of Systems  Constitutional: Negative for activity change, appetite change, diaphoresis, fatigue and fever.  Respiratory: Negative for cough, chest tightness and shortness of breath.   Cardiovascular: Negative for chest pain, palpitations and leg swelling.  Gastrointestinal: Positive for abdominal pain. Negative for abdominal distention, constipation, diarrhea, nausea and vomiting.  Genitourinary: Positive for vaginal discharge. Negative for decreased urine volume, dysuria,  flank pain, frequency, hematuria, pelvic pain and vaginal pain.  Neurological: Negative for dizziness, syncope, weakness, light-headedness, numbness and headaches.  Psychiatric/Behavioral: Negative.     Per HPI unless specifically indicated above     Objective:    Wt 140 lb (63.5 kg)    BMI 26.45 kg/m    Wt Readings from Last 3 Encounters:  05/09/19 140 lb (63.5 kg)  03/13/19 140 lb (63.5 kg)  02/05/19 143 lb 12.8 oz (65.2 kg)    Physical Exam   Unable to perform due to telephone only visit.  Results for orders placed or performed in visit on 05/06/19  Novel Coronavirus, NAA ([facility])   Specimen: Oropharyngeal(OP) collection in vial transport medium   OROPHARYNGEA  TESTING  Result Value Ref Range   SARS-CoV-2, NAA Not Detected Not Detected      Assessment & Plan:   Problem List Items Addressed This Visit      Other   Bad odor of urine - Primary    Unable to come into office today due to URI symptoms.  Suspect this odor is another bacterial infection, has had a few episodes of BV past months.  Will send in script for Flagyl.  Have instructed patient on proper hygiene techniques and discussed frequent infections with aging in females.  Recommend if worsening symptoms over weekend to be seen immediately in UC or ER.  She has scheduled appointment Wednesday for physical.         I discussed the assessment and treatment plan with the patient. The patient was provided an opportunity to ask questions and all were answered. The patient agreed with the plan and demonstrated an understanding of the instructions.   The patient was advised to call back or seek an in-person evaluation if the symptoms worsen or if the condition fails to improve as anticipated.   I provided 21+ minutes of time during this encounter.  Follow up plan: Return if symptoms worsen or fail to improve.

## 2019-05-09 NOTE — Discharge Instructions (Addendum)
You have been seen in the Emergency Department (ED) today for an allergic reaction.  You have been stable throughout your stay in the Emergency Department.  Please take your medications as prescribed and follow up with your doctor as indicated.   Please keep your Epi-Pen with you at all times and use it if experience shortness of breath or difficulty breathing or if you believe you are having a severe allergic reaction.  If you use the Epi-Pen, though, please call 911 afterwards or go immediately to your nearest Emergency Department.  If the source of your allergic reaction is not known, keep an exposure diary documenting the food you eat, any new lotions/soaps, or other potential triggers, and discuss this with your doctor.  Return to the Emergency Department (ED) if you experience any worsening or new symptoms that concern you.

## 2019-05-09 NOTE — ED Triage Notes (Signed)
Pt from home via [facility]. Per EMS, pt st prescribes Flagyl took it at 7pm an at 720pm broke out in hives, swollen lips; husband gave her with epi pen and 50mg  benadryl. Pt c/o "chest feels heavy".Pt denies SHOB at this time.  Tremors noted upon arrival. ED provider at bedside.

## 2019-05-09 NOTE — Patient Instructions (Signed)

## 2019-05-09 NOTE — ED Provider Notes (Signed)
10:51 PM Assumed care for off going team.   Blood pressure 132/73, pulse 75, temperature 97.7 F (36.5 C), temperature source Oral, resp. rate 20, height 5\' 6"  (1.676 m), weight 63.5 kg, SpO2 94 %.  See their HPI for full report but in brief patient came in for anaphylaxis.  Plan to reassess patient at 1030 to see if stable for discharge home.  10:54 PM reevaluated patient.  She has no swelling of her lips.  Patient feels much better.  Patient feels comfortable with discharge home.       Vanessa [place], MD 05/09/19 [phone]

## 2019-05-09 NOTE — ED Notes (Signed)
Pt provided with phone to contact family (ride home).

## 2019-05-09 NOTE — ED Notes (Signed)
Pt assisted up to commode to void.  

## 2019-05-09 NOTE — ED Provider Notes (Signed)
[facility] Emergency Department Provider Note  ____________________________________________   First MD Initiated Contact with Patient 05/09/19 2021     (approximate)  I have reviewed the triage vital signs and the nursing notes.   HISTORY  Chief Complaint Allergic Reaction    HPI Lindsey Bautista is a 58 y.o. female with past medical history as below here with lip swelling.  The patient states that she was in her usual state of health.  She was recent prescribed Flagyl for abdominal infection.  She states that she took her first dose.  Approximately 30 minutes later, she began to develop a diffuse, itchy, red, rash.  She then began to have lip swelling and felt very anxious.  She felt like her tongue was slightly swollen.  She felt short of breath.  She gave herself a an epinephrine pen that she had at home, and took 50 mg of Benadryl.  She then called EMS.  On EMS arrival, patient was noted to have marketed lip swelling.  She was anxious.  Since then, the rash has improved and the swelling is improving.  She states she feels somewhat better but feels very anxious and shaky.  She has a mild chest pressure, but states she is also very anxious which is common for her.       Past Medical History:  Diagnosis Date  . Atypical chest pain    a. 08/2016 Neg MV.  [name] COPD (chronic obstructive pulmonary disease) ([place])   . Coronary artery disease, non-occlusive    a.  LHC 06/19/2018: Mid LAD 50% with iFR 0.94  . Depression   . Diastolic dysfunction    a. 10/2016 Echo: EF 60-65%, no rwma, Gr1 DD, mild MR, nl LA size, nl RV fxn; b. 05/2018 Echo: EF 60-65%. No rwma. Mild MR. Nl RV fxn.  [name] GERD (gastroesophageal reflux disease)   . Hypotension   . IBS (irritable bowel syndrome)   . Right arm weakness   . Situational syncope   . TIA (transient ischemic attack)     Patient Active Problem List   Diagnosis Date Noted  . Bad odor of urine 05/09/2019  . Advance care  planning 04/16/2019  . Bacterial vaginosis 11/18/2018  . Slow transit constipation 11/18/2018  . Coronary artery disease, non-occlusive 09/02/2018  . Fibromyalgia 08/29/2017  . Conversion disorder with mixed symptoms 03/30/2016  . Aortic atherosclerosis ([place]) 12/29/2015  . Adrenal nodule ([place]) 12/29/2015  . Syncope and collapse   . COPD (chronic obstructive pulmonary disease) ([place]) 12/13/2015  . Breast pain 12/13/2015  . Gastroesophageal reflux disease without esophagitis   . PAD (peripheral artery disease) ([place]) 10/22/2015  . Hyperlipidemia 10/22/2015  . Cervical paraspinal muscle spasm 09/30/2015  . Benzodiazepine dependence, continuous ([place]) 09/08/2015  . Acute anxiety 06/07/2015  . Situational syncope 06/07/2015  . Smoker 04/21/2015  . Depression, recurrent ([place]) 12/19/2013  . Anxiety 12/19/2013    Past Surgical History:  Procedure Laterality Date  . COLONOSCOPY WITH PROPOFOL N/A 11/22/2015   Procedure: COLONOSCOPY WITH PROPOFOL;  Surgeon: Manya Silvas, MD;  Location: [facility] ENDOSCOPY;  Service: Endoscopy;  Laterality: N/A;  . ESOPHAGOGASTRODUODENOSCOPY (EGD) WITH PROPOFOL N/A 11/22/2015   Procedure: ESOPHAGOGASTRODUODENOSCOPY (EGD) WITH PROPOFOL;  Surgeon: Manya Silvas, MD;  [facility] ENDOSCOPY;  Service: Endoscopy;  Laterality: N/A;  . INTRAVASCULAR PRESSURE WIRE/FFR STUDY N/A 06/19/2018   Procedure: INTRAVASCULAR PRESSURE WIRE/FFR STUDY;  Surgeon: Nelva Bush, MD;  Location: [place] CV LAB;  Service: Cardiovascular;  Laterality: N/A;  .  LEFT HEART CATH AND CORONARY ANGIOGRAPHY N/A 06/19/2018   Procedure: LEFT HEART CATH AND CORONARY ANGIOGRAPHY;  Surgeon: Nelva Bush, MD;  Location: [place] CV LAB;  Service: Cardiovascular;  Laterality: N/A;  . OOPHORECTOMY    . TONSILLECTOMY    . VAGINAL HYSTERECTOMY      Prior to Admission medications   Medication Sig Start Date End Date Taking? Authorizing Provider  aspirin 81 MG tablet Take 81 mg by  mouth daily.    [provider]  atorvastatin (LIPITOR) 80 MG tablet Take 1 tablet (80 mg total) by mouth daily at 6 PM. 02/05/19   Theora Gianotti, NP  clindamycin (CLEOCIN) 300 MG capsule Take 1 capsule (300 mg total) by mouth 2 (two) times daily for 7 days. 05/09/19 05/16/19  Duffy Bruce, MD  clonazePAM (KLONOPIN) 1 MG tablet Take 1 tablet (1 mg total) by mouth 2 (two) times daily. 04/16/19   [name], MD  diphenhydrAMINE (BENADRYL) 25 MG tablet Take 1 tablet (25 mg total) by mouth 2 (two) times daily for 3 days. 05/09/19 05/12/19  Duffy Bruce, MD  EPINEPHrine 0.3 mg/0.3 mL IJ SOAJ injection Inject 0.3 mLs (0.3 mg total) into the muscle as needed for anaphylaxis. 05/09/19   Duffy Bruce, MD  estradiol (ESTRACE) 1 MG tablet Take 1 tablet (1 mg total) by mouth daily. 04/16/19   [name], MD  famotidine (PEPCID) 20 MG tablet Take 1 tablet (20 mg total) by mouth 2 (two) times daily for 3 days. 05/09/19 05/12/19  Duffy Bruce, MD  FLUoxetine (PROZAC) 20 MG capsule Take 1 capsule (20 mg total) by mouth daily. 04/16/19   [name], MD  metroNIDAZOLE (FLAGYL) 500 MG tablet Take 1 tablet (500 mg total) by mouth 2 (two) times daily for 7 days. 05/09/19 05/16/19  [name], Henrine Screws T, NP  nitroGLYCERIN (NITROSTAT) 0.4 MG SL tablet Place 1 tablet (0.4 mg total) under the tongue every 5 (five) minutes as needed for chest pain. 04/16/19   [name], MD  polyethylene glycol powder (GLYCOLAX/MIRALAX) 17 GM/SCOOP powder Take 17 g by mouth 2 (two) times daily as needed. 04/16/19   [name], MD  predniSONE (DELTASONE) 20 MG tablet Take 2 tablets (40 mg total) by mouth daily for 3 days. 05/09/19 05/12/19  Duffy Bruce, MD    Allergies Flagyl [metronidazole], Penicillin g, Iodinated diagnostic agents, and Flomax [tamsulosin hcl]  Family History  Problem Relation Age of Onset  . Bladder Cancer Mother   . Heart failure Mother   . Renal Disease Mother   . COPD  Father   . Heart disease Other   . COPD Other   . Stroke Other   . Breast cancer Paternal Aunt 4  . Colon cancer Brother     Social History Social History   Tobacco Use  . Smoking status: Current Every Day Smoker    Packs/day: 1.00    Years: 39.00    Pack years: 39.00    Types: Cigarettes  . Smokeless tobacco: Never Used  Substance Use Topics  . Alcohol use: No    Alcohol/week: 0.0 standard drinks  . Drug use: No    Review of Systems  Review of Systems  Constitutional: Negative for fatigue and fever.  HENT: Positive for facial swelling. Negative for congestion and sore throat.   Eyes: Negative for visual disturbance.  Respiratory: Positive for wheezing. Negative for cough and shortness of breath.   Cardiovascular: Positive for chest pain.  Gastrointestinal: Positive  for nausea. Negative for abdominal pain, diarrhea and vomiting.  Genitourinary: Negative for flank pain.  Musculoskeletal: Negative for back pain and neck pain.  Skin: Negative for rash and wound.  Neurological: Negative for weakness.  All other systems reviewed and are negative.    ____________________________________________  PHYSICAL EXAM:      VITAL SIGNS: ED Triage Vitals  Enc Vitals Group     BP 05/09/19 2017 127/76     Pulse Rate 05/09/19 2017 82     Resp 05/09/19 2017 (!) 30     Temp --      Temp src --      SpO2 05/09/19 2017 98 %     Weight 05/09/19 2018 139 lb 15.9 oz (63.5 kg)     Height 05/09/19 2018 5\' 6"  (1.676 m)     Head Circumference --      Peak Flow --      Pain Score --      Pain Loc --      Pain Edu? --      Excl. in [place]? --      Physical Exam Vitals signs and nursing note reviewed.  Constitutional:      General: She is not in acute distress.    Appearance: She is well-developed.  HENT:     Head: Normocephalic and atraumatic.     Comments: Moderate upper and lower lip swelling. OP widely patent. No tongue swelling. Posterior OP patent. Eyes:      Conjunctiva/sclera: Conjunctivae normal.  Neck:     Musculoskeletal: Neck supple.     Comments: No stridor Cardiovascular:     Rate and Rhythm: Normal rate and regular rhythm.     Heart sounds: Normal heart sounds. No murmur. No friction rub.  Pulmonary:     Effort: Pulmonary effort is normal. No respiratory distress.     Breath sounds: Normal breath sounds. No wheezing or rales.     Comments: No appreciable wheezing or increased WOB Abdominal:     General: There is no distension.     Palpations: Abdomen is soft.     Tenderness: There is no abdominal tenderness.  Skin:    General: Skin is warm.     Capillary Refill: Capillary refill takes less than 2 seconds.     Comments: Minimal scattered urticarial rash  Neurological:     Mental Status: She is alert and oriented to person, place, and time.     Motor: No abnormal muscle tone.       ____________________________________________   LABS (all labs ordered are listed, but only abnormal results are displayed)  Labs Reviewed  CBC WITH DIFFERENTIAL/PLATELET  BASIC METABOLIC PANEL    ____________________________________________  EKG: normal sinus rhythm, ventricular rate 77.  PR 143, QRS 103, QTc 468.  No acute ST elevations or depressions.  No acute ischemia or infarct. ________________________________________  RADIOLOGY All imaging, including plain films, CT scans, and ultrasounds, independently reviewed by me, and interpretations confirmed via formal radiology reads.  ED MD interpretation:   None  Official radiology report(s): No results found.  ____________________________________________  PROCEDURES   Procedure(s) performed (including Critical Care):  Procedures  ____________________________________________  INITIAL IMPRESSION / MDM / [place] / ED COURSE  As part of my medical decision making, I reviewed the following data within the electronic MEDICAL RECORD NUMBER Notes from prior ED visits and [place]  Controlled Substance Database      *PARICIA SHULTIS was evaluated in Emergency Department on 05/09/2019  for the symptoms described in the history of present illness. She was evaluated in the context of the global COVID-19 pandemic, which necessitated consideration that the patient might be at risk for infection with the SARS-CoV-2 virus that causes COVID-19. Institutional protocols and algorithms that pertain to the evaluation of patients at risk for COVID-19 are in a state of rapid change based on information released by regulatory bodies including the CDC and federal and state organizations. These policies and algorithms were followed during the patient's care in the ED.  Some ED evaluations and interventions may be delayed as a result of limited staffing during the pandemic.*   Clinical Course as of May 08 2046  Fri May 09, 2019  2033 58 yo F here with likely anaphylaxis 2/2 flagyl exposure. H/o multiple drug allergies in past. No other apparent triggers. S/p Epi and benadryl 50 mg at home. No ongoing signs of anaphylaxis or shock. Will give pepcid, steroids, fluids, and monitor >3 hr s/p epi. Pt anxious as wel likely 2/2 epi use, with reported CP. EKG non-ischemic, will monitor. Do not suspect ACS, PE, dissection clinically.   [CI]    Clinical Course User Index [CI] Duffy Bruce, MD    Medical Decision Making: As above.   Patient care transferred to Dr. Jari Pigg at the end of my shift. Patient presentation, ED course, and plan of care discussed with review of all pertinent labs and imaging. Please see his/her note for further details regarding further ED course and disposition.   ____________________________________________  FINAL CLINICAL IMPRESSION(S) / ED DIAGNOSES  Final diagnoses:  Anaphylaxis, initial encounter     MEDICATIONS GIVEN DURING THIS VISIT:  Medications  famotidine (PEPCID) IVPB 20 mg premix (20 mg Intravenous New Bag/Given 05/09/19 2030)  LORazepam (ATIVAN)  injection 0.5 mg (has no administration in time range)  sodium chloride 0.9 % bolus 1,000 mL (1,000 mLs Intravenous New Bag/Given 05/09/19 2030)  methylPREDNISolone sodium succinate (SOLU-MEDROL) 125 mg/2 mL injection 125 mg (125 mg Intravenous Given 05/09/19 2030)     ED Discharge Orders         Ordered    clindamycin (CLEOCIN) 300 MG capsule  2 times daily     05/09/19 2036    EPINEPHrine 0.3 mg/0.3 mL IJ SOAJ injection  As needed     05/09/19 2039    famotidine (PEPCID) 20 MG tablet  2 times daily     05/09/19 2039    predniSONE (DELTASONE) 20 MG tablet  Daily     05/09/19 2039    diphenhydrAMINE (BENADRYL) 25 MG tablet  2 times daily     05/09/19 2039           Note:  This document was prepared using Dragon voice recognition software and may include unintentional dictation errors.   Duffy Bruce, MD 05/09/19 2047

## 2019-05-14 ENCOUNTER — Other Ambulatory Visit: Payer: Self-pay

## 2019-05-14 ENCOUNTER — Encounter: Payer: Self-pay | Admitting: Nurse Practitioner

## 2019-05-14 ENCOUNTER — Ambulatory Visit (INDEPENDENT_AMBULATORY_CARE_PROVIDER_SITE_OTHER): Payer: Medicare Other | Admitting: Nurse Practitioner

## 2019-05-14 VITALS — BP 127/74 | HR 62 | Temp 98.2°F | Ht 61.0 in | Wt 145.0 lb

## 2019-05-14 DIAGNOSIS — F339 Major depressive disorder, recurrent, unspecified: Secondary | ICD-10-CM

## 2019-05-14 DIAGNOSIS — F17210 Nicotine dependence, cigarettes, uncomplicated: Secondary | ICD-10-CM | POA: Diagnosis not present

## 2019-05-14 DIAGNOSIS — F419 Anxiety disorder, unspecified: Secondary | ICD-10-CM

## 2019-05-14 DIAGNOSIS — I2510 Atherosclerotic heart disease of native coronary artery without angina pectoris: Secondary | ICD-10-CM | POA: Diagnosis not present

## 2019-05-14 DIAGNOSIS — N760 Acute vaginitis: Secondary | ICD-10-CM | POA: Diagnosis not present

## 2019-05-14 DIAGNOSIS — B379 Candidiasis, unspecified: Secondary | ICD-10-CM

## 2019-05-14 DIAGNOSIS — J029 Acute pharyngitis, unspecified: Secondary | ICD-10-CM

## 2019-05-14 DIAGNOSIS — F1320 Sedative, hypnotic or anxiolytic dependence, uncomplicated: Secondary | ICD-10-CM

## 2019-05-14 DIAGNOSIS — B9689 Other specified bacterial agents as the cause of diseases classified elsewhere: Secondary | ICD-10-CM | POA: Diagnosis not present

## 2019-05-14 DIAGNOSIS — K5901 Slow transit constipation: Secondary | ICD-10-CM | POA: Diagnosis not present

## 2019-05-14 LAB — UA/M W/RFLX CULTURE, ROUTINE
Bilirubin, UA: NEGATIVE
Glucose, UA: NEGATIVE
Ketones, UA: NEGATIVE
Leukocytes,UA: NEGATIVE
Nitrite, UA: NEGATIVE
Protein,UA: NEGATIVE
RBC, UA: NEGATIVE
Specific Gravity, UA: 1.01 (ref 1.005–1.030)
Urobilinogen, Ur: 0.2 mg/dL (ref 0.2–1.0)
pH, UA: 7 (ref 5.0–7.5)

## 2019-05-14 LAB — WET PREP FOR TRICH, YEAST, CLUE
Clue Cell Exam: NEGATIVE
Trichomonas Exam: NEGATIVE
Yeast Exam: POSITIVE — AB

## 2019-05-14 MED ORDER — FLUCONAZOLE 150 MG PO TABS: 150.0000 mg | ORAL_TABLET | Freq: Once | ORAL | 0 refills | Status: AC

## 2019-05-14 MED ORDER — FAMOTIDINE 20 MG PO TABS: 20.0000 mg | ORAL_TABLET | Freq: Two times a day (BID) | ORAL | 2 refills | Status: DC

## 2019-05-14 MED ORDER — CLONAZEPAM 1 MG PO TABS: 1.0000 mg | ORAL_TABLET | Freq: Two times a day (BID) | ORAL | 3 refills | Status: DC

## 2019-05-14 NOTE — Assessment & Plan Note (Signed)
At length discussion on long term use, she is not interested in reduction at this time or medication changes. 

## 2019-05-14 NOTE — Progress Notes (Signed)
BP 127/74   Pulse 62   Temp 98.2 F (36.8 C) (Oral)   Ht 5\' 1"  (1.549 m)   Wt 145 lb (65.8 kg)   SpO2 96%   BMI 27.40 kg/m    Subjective:    Patient ID: Lindsey Bautista, female    DOB: 10/14/1960, 58 y.o.   MRN: [id number]  HPI: Lindsey Bautista is a 58 y.o. female presenting on 05/14/2019 for comprehensive medical examination. Current medical complaints include:none  She currently lives with: self Menopausal Symptoms: no   She had recent vaginal infection symptoms and had allergic reaction to abx therapy, would like to do self swab today and urine check.  States symptoms not 100% better.  Is nervous because her mother had bladder cancer and then aunt had bladder cancer, aunt found out a month after mother passed.  Reviewed bladder cancer with her via research based pieces and discussed a major risk factor is smoking, she is a smoker but is not interested in quitting at this time . Recommended complete cessation.  She denies hematuria, dysuria, frequency, or abdominal pain at this time.  SORE THROAT Loses voice a lot, this started with her first stroke 5 years ago.  Has had sore throat for 6 weeks, before she got sick.  Denies any heart burn symptoms.  Does state sometimes she feels like food is getting stuck in throat.  Has history of tonsillectomy.  Smokes 1 PPD at this time, is not interested in quitting.  Denies N&V, diarrhea, blood in stool, or abdominal pain. Fever: no Cough: no Shortness of breath: no Wheezing: no Chest pain: no Chest tightness: no Chest congestion: no Nasal congestion: no Runny nose: no Post nasal drip: no Sneezing: no Sore throat: yes Swollen glands: no Sinus pressure: no Headache: no Face pain: no Toothache: no Ear pain: none Ear pressure: none Eyes red/itching:no Eye drainage/crusting: no  Vomiting: no Rash: no Fatigue: yes   ANXIETY/STRESS Continues Prozac 20 MG daily and Klonopin 1 MG BID.  Pt is aware of risks of benzo medication use to  include increased sedation, respiratory suppression, falls, dependence and cardiovascular events.  Pt would like to continue treatment as benefit determined to outweigh risk.  Has been to psychiatry and therapy in past, but does not wish to return as did not have good experience. Duration:stable Anxious mood: yes  Excessive worrying: yes Irritability: no  Sweating: no Nausea: no Palpitations:no Hyperventilation: no Panic attacks: yes Agoraphobia: no  Obscessions/compulsions: no Depressed mood: yes Depression screen [facility] 2/9 05/14/2019 09/02/2018 05/27/2018 08/29/2017 05/09/2017  Decreased Interest 3 1 0 3 3  Down, Depressed, Hopeless 3 1 2 2 3   PHQ - 2 Score 6 2 2 5 6   Altered sleeping 2 2 3 1 2   Tired, decreased energy 3 3 3 3 3   Change in appetite 3 1 3 3 3   Feeling bad or failure about yourself  3 1 2 1 2   Trouble concentrating 2 2 1 2 3   Moving slowly or fidgety/restless 3 1 2 1 2   Suicidal thoughts 0 0 0 0 0  PHQ-9 Score 22 12 16 16 21   Difficult doing work/chores Somewhat difficult - Somewhat difficult - Very difficult   Anhedonia: no Weight changes: no Insomnia: yes hard to fall asleep  Hypersomnia: no Fatigue/loss of energy: no Feelings of worthlessness: no Feelings of guilt: no Impaired concentration/indecisiveness: yes Suicidal ideations: no  Crying spells: no Recent Stressors/Life Changes: no   Relationship problems: no  Family stress: no     Financial stress: no    Job stress: no    Recent death/loss: no  GAD 7 : Generalized Anxiety Score 05/14/2019 09/02/2018  Nervous, Anxious, on Edge 3 1  Control/stop worrying 3 3  Worry too much - different things 3 3  Trouble relaxing 3 1  Restless 2 2  Easily annoyed or irritable 3 2  Afraid - awful might happen 3 1  Total GAD 7 Score 20 13  Anxiety Difficulty Somewhat difficult -    The patient does not have a history of falls. I did not complete a risk assessment for falls. A plan of care for falls was not  documented.   Past Medical History:  Past Medical History:  Diagnosis Date  . Atypical chest pain    a. 08/2016 Neg MV.  [name] COPD (chronic obstructive pulmonary disease) ([place])   . Coronary artery disease, non-occlusive    a.  LHC 06/19/2018: Mid LAD 50% with iFR 0.94  . Depression   . Diastolic dysfunction    a. 10/2016 Echo: EF 60-65%, no rwma, Gr1 DD, mild MR, nl LA size, nl RV fxn; b. 05/2018 Echo: EF 60-65%. No rwma. Mild MR. Nl RV fxn.  [name] GERD (gastroesophageal reflux disease)   . Hypotension   . IBS (irritable bowel syndrome)   . Right arm weakness   . Situational syncope   . TIA (transient ischemic attack)     Surgical History:  Past Surgical History:  Procedure Laterality Date  . COLONOSCOPY WITH PROPOFOL N/A 11/22/2015   Procedure: COLONOSCOPY WITH PROPOFOL;  Surgeon: Manya Silvas, MD;  Location: [facility] ENDOSCOPY;  Service: Endoscopy;  Laterality: N/A;  . ESOPHAGOGASTRODUODENOSCOPY (EGD) WITH PROPOFOL N/A 11/22/2015   Procedure: ESOPHAGOGASTRODUODENOSCOPY (EGD) WITH PROPOFOL;  Surgeon: Manya Silvas, MD;  Location: [facility] ENDOSCOPY;  Service: Endoscopy;  Laterality: N/A;  . INTRAVASCULAR PRESSURE WIRE/FFR STUDY N/A 06/19/2018   Procedure: INTRAVASCULAR PRESSURE WIRE/FFR STUDY;  Surgeon: Nelva Bush, MD;  Location: [place] CV LAB;  Service: Cardiovascular;  Laterality: N/A;  . LEFT HEART CATH AND CORONARY ANGIOGRAPHY N/A 06/19/2018   Procedure: LEFT HEART CATH AND CORONARY ANGIOGRAPHY;  Surgeon: Nelva Bush, MD;  Location: [place] CV LAB;  Service: Cardiovascular;  Laterality: N/A;  . OOPHORECTOMY    . TONSILLECTOMY    . VAGINAL HYSTERECTOMY      Medications:  Current Outpatient Medications on File Prior to Visit  Medication Sig  . aspirin 81 MG tablet Take 81 mg by mouth daily.  [name] atorvastatin (LIPITOR) 80 MG tablet Take 1 tablet (80 mg total) by mouth daily at 6 PM.  . estradiol (ESTRACE) 1 MG tablet Take 1 tablet (1 mg total) by mouth daily.  [name]  FLUoxetine (PROZAC) 20 MG capsule Take 1 capsule (20 mg total) by mouth daily.  . polyethylene glycol powder (GLYCOLAX/MIRALAX) 17 GM/SCOOP powder Take 17 g by mouth 2 (two) times daily as needed.  [name] EPINEPHrine 0.3 mg/0.3 mL IJ SOAJ injection Inject 0.3 mLs (0.3 mg total) into the muscle as needed for anaphylaxis. (Patient not taking: Reported on 05/14/2019)  . nitroGLYCERIN (NITROSTAT) 0.4 MG SL tablet Place 1 tablet (0.4 mg total) under the tongue every 5 (five) minutes as needed for chest pain. (Patient not taking: Reported on 05/14/2019)   No current facility-administered medications on file prior to visit.     Allergies:  Allergies  Allergen Reactions  . Flagyl [Metronidazole] Anaphylaxis  . Penicillin G Anaphylaxis and Other (See Comments)  Has patient had a PCN reaction causing immediate rash, facial/tongue/throat swelling, SOB or lightheadedness with hypotension: [id number] Has patient had a PCN reaction causing severe rash involving mucus membranes or skin necrosis: [id number]} Has patient had a PCN reaction that required hospitalization no:[id number]} Has patient had a PCN reaction occurring within the last 10 years: [id number] If all of the above answers are "NO", then may proceed with Cephalosporin use.   . Iodinated Diagnostic Agents Rash and Nausea And Vomiting  . Flomax [Tamsulosin Hcl] Hives    Per ER visit 03/11/19    Social History:  Social History   Socioeconomic History  . Marital status: Married    Spouse name: Caitlin Comis  . Number of children: 2  . Years of education: 31  . Highest education level: High school graduate  Occupational History  . Occupation: Unemployed  Social Needs  . Financial resource strain: Not on file  . Food insecurity    Worry: Not on file    Inability: Not on file  . Transportation needs    Medical: Not on file    Non-medical: Not on file  Tobacco Use  . Smoking status: Current Every Day Smoker    Packs/day: 1.00     Years: 39.00    Pack years: 39.00    Types: Cigarettes  . Smokeless tobacco: Never Used  Substance and Sexual Activity  . Alcohol use: No    Alcohol/week: 0.0 standard drinks  . Drug use: No  . Sexual activity: Yes  Lifestyle  . Physical activity    Days per week: Not on file    Minutes per session: Not on file  . Stress: Not at all  Relationships  . Social Herbalist on phone: Not on file    Gets together: Not on file    Attends religious service: Not on file    Active member of club or organization: Not on file    Attends meetings of clubs or organizations: Not on file    Relationship status: Married  . Intimate partner violence    Fear of current or ex partner: No    Emotionally abused: No    Physically abused: No    Forced sexual activity: No  Other Topics Concern  . Not on file  Social History Narrative   Lives at home with husband.    Caffeine use: 1 cup coffee per day   Social History   Tobacco Use  Smoking Status Current Every Day Smoker  . Packs/day: 1.00  . Years: 39.00  . Pack years: 39.00  . Types: Cigarettes  Smokeless Tobacco Never Used   Social History   Substance and Sexual Activity  Alcohol Use No  . Alcohol/week: 0.0 standard drinks    Family History:  Family History  Problem Relation Age of Onset  . Bladder Cancer Mother   . Heart failure Mother   . Renal Disease Mother   . COPD Father   . Heart disease Other   . COPD Other   . Stroke Other   . Breast cancer Paternal Aunt 42  . Colon cancer Brother     Past medical history, surgical history, medications, allergies, family history and social history reviewed with patient today and changes made to appropriate areas of the chart.   Review of Systems - negative All other ROS negative except what is listed above and in the HPI.      Objective:    BP 127/74   Pulse  62   Temp 98.2 F (36.8 C) (Oral)   Ht 5\' 1"  (1.549 m)   Wt 145 lb (65.8 kg)   SpO2 96%   BMI  27.40 kg/m   Wt Readings from Last 3 Encounters:  05/14/19 145 lb (65.8 kg)  05/09/19 139 lb 15.9 oz (63.5 kg)  05/09/19 140 lb (63.5 kg)    Physical Exam Constitutional:      General: She is awake. She is not in acute distress.    Appearance: She is well-developed. She is not ill-appearing.  HENT:     Head: Normocephalic and atraumatic.     Right Ear: Hearing, tympanic membrane, ear canal and external ear normal. No drainage.     Left Ear: Hearing, tympanic membrane, ear canal and external ear normal. No drainage.     Nose: Nose normal.     Right Sinus: No maxillary sinus tenderness or frontal sinus tenderness.     Left Sinus: No maxillary sinus tenderness or frontal sinus tenderness.     Mouth/Throat:     Mouth: Mucous membranes are moist.     Pharynx: Oropharynx is clear. Uvula midline. No pharyngeal swelling, oropharyngeal exudate or posterior oropharyngeal erythema.  Eyes:     General: Lids are normal.        Right eye: No discharge.        Left eye: No discharge.     Extraocular Movements: Extraocular movements intact.     Conjunctiva/sclera: Conjunctivae normal.     Pupils: Pupils are equal, round, and reactive to light.     Visual Fields: Right eye visual fields normal and left eye visual fields normal.  Neck:     Musculoskeletal: Normal range of motion and neck supple.     Thyroid: No thyromegaly.     Vascular: No carotid bruit.     Trachea: Trachea normal.  Cardiovascular:     Rate and Rhythm: Normal rate and regular rhythm.     Heart sounds: Normal heart sounds. No murmur. No gallop.   Pulmonary:     Effort: Pulmonary effort is normal. No accessory muscle usage or respiratory distress.     Breath sounds: Normal breath sounds.  Chest:     Comments: Deferred per patient request, recent mammogram. Abdominal:     General: Bowel sounds are normal.     Palpations: Abdomen is soft. There is no hepatomegaly or splenomegaly.     Tenderness: There is no abdominal  tenderness.  Musculoskeletal: Normal range of motion.     Right lower leg: No edema.     Left lower leg: No edema.  Lymphadenopathy:     Head:     Right side of head: No submental, submandibular, tonsillar, preauricular or posterior auricular adenopathy.     Left side of head: No submental, submandibular, tonsillar, preauricular or posterior auricular adenopathy.     Cervical: No cervical adenopathy.  Skin:    General: Skin is warm and dry.     Capillary Refill: Capillary refill takes less than 2 seconds.     Findings: No rash.  Neurological:     Mental Status: She is alert and oriented to person, place, and time.     Cranial Nerves: Cranial nerves are intact.     Gait: Gait is intact.     Deep Tendon Reflexes: Reflexes are normal and symmetric.     Reflex Scores:      Brachioradialis reflexes are 2+ on the right side and 2+ on the left side.  Patellar reflexes are 2+ on the right side and 2+ on the left side. Psychiatric:        Attention and Perception: Attention normal.        Mood and Affect: Mood normal.        Speech: Speech normal.        Behavior: Behavior normal. Behavior is cooperative.        Thought Content: Thought content normal.        Judgment: Judgment normal.     Results for orders placed or performed in visit on 05/14/19  WET PREP FOR [place], YEAST, CLUE   Specimen: Urine   URINE  Result Value Ref Range   Trichomonas Exam Negative Negative   Yeast Exam Positive (A) Negative   Clue Cell Exam Negative Negative  UA/M w/rflx Culture, Routine   Specimen: Urine   URINE  Result Value Ref Range   Specific Gravity, UA 1.010 1.005 - 1.030   pH, UA 7.0 5.0 - 7.5   Color, UA Yellow Yellow   Appearance Ur Clear Clear   Leukocytes,UA Negative Negative   Protein,UA Negative Negative/Trace   Glucose, UA Negative Negative   Ketones, UA Negative Negative   RBC, UA Negative Negative   Bilirubin, UA Negative Negative   Urobilinogen, Ur 0.2 0.2 - 1.0 mg/dL    Nitrite, UA Negative Negative      Assessment & Plan:   Problem List Items Addressed This Visit      Cardiovascular and Mediastinum   Coronary artery disease, non-occlusive     Other   Nicotine dependence, cigarettes, uncomplicated    I have recommended complete cessation of tobacco use. I have discussed various options available for assistance with tobacco cessation including over the counter methods (Nicotine gum, patch and lozenges). We also discussed prescription options (Chantix, Nicotine Inhaler / Nasal Spray). The patient is not interested in pursuing any prescription tobacco cessation options at this time.       Depression, recurrent ([place]) - Primary    Chronic, stable.  Continue current medication regimen and adjust as needed.  Denies SI/HI.  Consider change from Prozac to Zoloft in future, which would offer benefit with anxiety and depression.      Benzodiazepine dependence, continuous ([place])    At length discussion on long term use, she is not interested in reduction at this time or medication changes.      Anxiety    Chronic, ongoing in conjunction with conversation disorder.  Continue Klonopin at this time, patient educated on risks of medication and is not interested in reducing.  Continue Prozac, discussed with her changing to Zoloft if increase anxiety/depression noted.  At this time she denies SI/HI and wishes to maintain current regimen.      Relevant Medications   clonazePAM (KLONOPIN) 1 MG tablet   Yeast infection    Acute with negative clue cells, but positive yeast on wet prep.  UA negative.  Script for one dose Fluconazole sent.  Return to office for worsening or continued symptoms.      Relevant Medications   fluconazole (DIFLUCAN) 150 MG tablet   Other Relevant Orders   UA/M w/rflx Culture, Routine (Completed)   WET PREP FOR [place], YEAST, CLUE (Completed)   Sore throat    Suspect related to reflux.  Will trial Pepcid, script sent.  Educated on diet  changes and keeping food journal.  If worsening or continued symptoms return to office immediately.  Follow up plan: Return in about 6 months (around 11/11/2019) for Anxiety/Depression,  HTN/HLD.   LABORATORY TESTING:  - Pap smear: refused today, will try to obtain past GYN records to see if complete hysterectomy in past  IMMUNIZATIONS:   - Tdap: Tetanus vaccination status reviewed: last tetanus booster within 10 years. - Influenza: Administered today - Pneumovax: Not applicable - Prevnar: Not applicable - HPV: Not applicable - Zostavax vaccine: Refused  SCREENING: -Mammogram: Up to date  - Colonoscopy: Up to date  - Bone Density: Not applicable  -Hearing Test: Not applicable  -Spirometry: Not applicable   PATIENT COUNSELING:   Advised to take 1 mg of folate supplement per day if capable of pregnancy.   Sexuality: Discussed sexually transmitted diseases, partner selection, use of condoms, avoidance of unintended pregnancy  and contraceptive alternatives.   Advised to avoid cigarette smoking.  I discussed with the patient that most people either abstain from alcohol or drink within safe limits (<=14/week and <=4 drinks/occasion for males, <=7/weeks and <= 3 drinks/occasion for females) and that the risk for alcohol disorders and other health effects rises proportionally with the number of drinks per week and how often a drinker exceeds daily limits.  Discussed cessation/primary prevention of drug use and availability of treatment for abuse.   Diet: Encouraged to adjust caloric intake to maintain  or achieve ideal body weight, to reduce intake of dietary saturated fat and total fat, to limit sodium intake by avoiding high sodium foods and not adding table salt, and to maintain adequate dietary potassium and calcium preferably from fresh fruits, vegetables, and low-fat dairy products.    stressed the importance of regular exercise  Injury prevention: Discussed safety  belts, safety helmets, smoke detector, smoking near bedding or upholstery.   Dental health: Discussed importance of regular tooth brushing, flossing, and dental visits.    NEXT PREVENTATIVE PHYSICAL DUE IN 1 YEAR. Return in about 6 months (around 11/11/2019) for Anxiety/Depression,  HTN/HLD.

## 2019-05-14 NOTE — Assessment & Plan Note (Signed)
Chronic, ongoing in conjunction with conversation disorder.  Continue Klonopin at this time, patient educated on risks of medication and is not interested in reducing.  Continue Prozac, discussed with her changing to Zoloft if increase anxiety/depression noted.  At this time she denies SI/HI and wishes to maintain current regimen.

## 2019-05-14 NOTE — Assessment & Plan Note (Signed)
Acute with negative clue cells, but positive yeast on wet prep.  UA negative.  Script for one dose Fluconazole sent.  Return to office for worsening or continued symptoms.

## 2019-05-14 NOTE — Patient Instructions (Signed)
Vaginal Yeast infection, Adult  Vaginal yeast infection is a condition that causes vaginal discharge as well as soreness, swelling, and redness (inflammation) of the vagina. This is a common condition. Some women get this infection frequently. What are the causes? This condition is caused by a change in the normal balance of the yeast (candida) and bacteria that live in the vagina. This change causes an overgrowth of yeast, which causes the inflammation. What increases the risk? The condition is more likely to develop in women who:  Take antibiotic medicines.  Have diabetes.  Take birth control pills.  Are pregnant.  Douche often.  Have a weak body defense system (immune system).  Have been taking steroid medicines for a long time.  Frequently wear tight clothing. What are the signs or symptoms? Symptoms of this condition include:  White, thick, creamy vaginal discharge.  Swelling, itching, redness, and irritation of the vagina. The lips of the vagina (vulva) may be affected as well.  Pain or a burning feeling while urinating.  Pain during sex. How is this diagnosed? This condition is diagnosed based on:  Your medical history.  A physical exam.  A pelvic exam. Your health care provider will examine a sample of your vaginal discharge under a microscope. Your health care provider may send this sample for testing to confirm the diagnosis. How is this treated? This condition is treated with medicine. Medicines may be over-the-counter or prescription. You may be told to use one or more of the following:  Medicine that is taken by mouth (orally).  Medicine that is applied as a cream (topically).  Medicine that is inserted directly into the vagina (suppository). Follow these instructions at home:  Lifestyle  Do not have sex until your health care provider approves. Tell your sex partner that you have a yeast infection. That person should go to his or her health care  provider and ask if they should also be treated.  Do not wear tight clothes, such as pantyhose or tight pants.  Wear breathable cotton underwear. General instructions  Take or apply over-the-counter and prescription medicines only as told by your health care provider.  Eat more yogurt. This may help to keep your yeast infection from returning.  Do not use tampons until your health care provider approves.  Try taking a sitz bath to help with discomfort. This is a warm water bath that is taken while you are sitting down. The water should only come up to your hips and should cover your buttocks. Do this 3-4 times per day or as told by your health care provider.  Do not douche.  If you have diabetes, keep your blood sugar levels under control.  Keep all follow-up visits as told by your health care provider. This is important. Contact a health care provider if:  You have a fever.  Your symptoms go away and then return.  Your symptoms do not get better with treatment.  Your symptoms get worse.  You have new symptoms.  You develop blisters in or around your vagina.  You have blood coming from your vagina and it is not your menstrual period.  You develop pain in your abdomen. Summary  Vaginal yeast infection is a condition that causes discharge as well as soreness, swelling, and redness (inflammation) of the vagina.  This condition is treated with medicine. Medicines may be over-the-counter or prescription.  Take or apply over-the-counter and prescription medicines only as told by your health care provider.  Do not douche.   Do not have sex or use tampons until your health care provider approves.  Contact a health care provider if your symptoms do not get better with treatment or your symptoms go away and then return. This information is not intended to replace advice given to you by your health care provider. Make sure you discuss any questions you have with your health care  provider. Document Released: 05/17/2005 Document Revised: 12/24/2017 Document Reviewed: 12/24/2017 Elsevier Patient Education  2020 [employer]  

## 2019-05-14 NOTE — Assessment & Plan Note (Addendum)
Suspect related to reflux.  Will trial Pepcid, script sent.  Educated on diet changes and keeping food journal.  If worsening or continued symptoms return to office immediately.

## 2019-05-14 NOTE — Assessment & Plan Note (Signed)
Chronic, stable.  Continue current medication regimen and adjust as needed.  Denies SI/HI.  Consider change from Prozac to Zoloft in future, which would offer benefit with anxiety and depression. 

## 2019-05-14 NOTE — Assessment & Plan Note (Signed)
I have recommended complete cessation of tobacco use. I have discussed various options available for assistance with tobacco cessation including over the counter methods (Nicotine gum, patch and lozenges). We also discussed prescription options (Chantix, Nicotine Inhaler / Nasal Spray). The patient is not interested in pursuing any prescription tobacco cessation options at this time.  

## 2019-05-15 LAB — LIPID PANEL
Chol/HDL Ratio: 4.1 ratio (ref 0.0–4.4)
Cholesterol, Total: 191 mg/dL (ref 100–199)
HDL: 47 mg/dL (ref >39)
LDL Chol Calc (NIH): 116 mg/dL — ABNORMAL HIGH (ref 0–99)
Triglycerides: 157 mg/dL — ABNORMAL HIGH (ref 0–149)
VLDL Cholesterol Cal: 28 mg/dL (ref 5–40)

## 2019-05-15 LAB — COMPREHENSIVE METABOLIC PANEL
ALT: 7 IU/L (ref 0–32)
AST: 15 IU/L (ref 0–40)
Albumin/Globulin Ratio: 1.5 (ref 1.2–2.2)
Albumin: 4.1 g/dL (ref 3.8–4.9)
Alkaline Phosphatase: 86 IU/L (ref 39–117)
BUN/Creatinine Ratio: 10 (ref 9–23)
BUN: 9 mg/dL (ref 6–24)
Bilirubin Total: 0.5 mg/dL (ref 0.0–1.2)
CO2: 27 mmol/L (ref 20–29)
Calcium: 10.1 mg/dL (ref 8.7–10.2)
Chloride: 100 mmol/L (ref 96–106)
Creatinine, Ser: 0.9 mg/dL (ref 0.57–1.00)
GFR calc Af Amer: 82 mL/min/{1.73_m2} (ref >59)
GFR calc non Af Amer: 71 mL/min/{1.73_m2} (ref >59)
Globulin, Total: 2.7 g/dL (ref 1.5–4.5)
Glucose: 78 mg/dL (ref 65–99)
Potassium: 4.6 mmol/L (ref 3.5–5.2)
Sodium: 141 mmol/L (ref 134–144)
Total Protein: 6.8 g/dL (ref 6.0–8.5)

## 2019-05-15 LAB — TSH: TSH: 1.1 u[IU]/mL (ref 0.450–4.500)

## 2019-05-15 LAB — CBC WITH DIFFERENTIAL/PLATELET
Basophils Absolute: 0.1 10*3/uL (ref 0.0–0.2)
Basos: 1 %
EOS (ABSOLUTE): 0.1 10*3/uL (ref 0.0–0.4)
Eos: 2 %
Hematocrit: 41.2 % (ref 34.0–46.6)
Hemoglobin: 13.9 g/dL (ref 11.1–15.9)
Immature Grans (Abs): 0 10*3/uL (ref 0.0–0.1)
Immature Granulocytes: 0 %
Lymphocytes Absolute: 2.5 10*3/uL (ref 0.7–3.1)
Lymphs: 36 %
MCH: 30.3 pg (ref 26.6–33.0)
MCHC: 33.7 g/dL (ref 31.5–35.7)
MCV: 90 fL (ref 79–97)
Monocytes Absolute: 0.6 10*3/uL (ref 0.1–0.9)
Monocytes: 8 %
Neutrophils Absolute: 3.6 10*3/uL (ref 1.4–7.0)
Neutrophils: 53 %
Platelets: 272 10*3/uL (ref 150–450)
RBC: 4.59 x10E6/uL (ref 3.77–5.28)
RDW: 12.8 % (ref 11.7–15.4)
WBC: 6.9 10*3/uL (ref 3.4–10.8)

## 2019-05-25 ENCOUNTER — Emergency Department
Admission: EM | Admit: 2019-05-25 | Discharge: 2019-05-25 | Disposition: A | Discharge status: Home / Self Care | Payer: Medicare Other | Attending: Emergency Medicine | Admitting: Emergency Medicine

## 2019-05-25 ENCOUNTER — Emergency Department: Payer: Medicare Other

## 2019-05-25 ENCOUNTER — Other Ambulatory Visit: Payer: Self-pay

## 2019-05-25 ENCOUNTER — Encounter: Payer: Self-pay | Admitting: Emergency Medicine

## 2019-05-25 DIAGNOSIS — R52 Pain, unspecified: Secondary | ICD-10-CM

## 2019-05-25 DIAGNOSIS — J449 Chronic obstructive pulmonary disease, unspecified: Secondary | ICD-10-CM | POA: Diagnosis not present

## 2019-05-25 DIAGNOSIS — N3000 Acute cystitis without hematuria: Secondary | ICD-10-CM

## 2019-05-25 DIAGNOSIS — R200 Anesthesia of skin: Secondary | ICD-10-CM | POA: Insufficient documentation

## 2019-05-25 DIAGNOSIS — F17210 Nicotine dependence, cigarettes, uncomplicated: Secondary | ICD-10-CM | POA: Insufficient documentation

## 2019-05-25 DIAGNOSIS — R519 Headache, unspecified: Secondary | ICD-10-CM | POA: Diagnosis not present

## 2019-05-25 DIAGNOSIS — R079 Chest pain, unspecified: Secondary | ICD-10-CM | POA: Diagnosis not present

## 2019-05-25 DIAGNOSIS — Z8673 Personal history of transient ischemic attack (TIA), and cerebral infarction without residual deficits: Secondary | ICD-10-CM | POA: Diagnosis not present

## 2019-05-25 DIAGNOSIS — I2510 Atherosclerotic heart disease of native coronary artery without angina pectoris: Secondary | ICD-10-CM | POA: Diagnosis not present

## 2019-05-25 DIAGNOSIS — F419 Anxiety disorder, unspecified: Secondary | ICD-10-CM | POA: Insufficient documentation

## 2019-05-25 DIAGNOSIS — Z7982 Long term (current) use of aspirin: Secondary | ICD-10-CM | POA: Insufficient documentation

## 2019-05-25 DIAGNOSIS — Z79899 Other long term (current) drug therapy: Secondary | ICD-10-CM | POA: Insufficient documentation

## 2019-05-25 DIAGNOSIS — F410 Panic disorder [episodic paroxysmal anxiety] without agoraphobia: Secondary | ICD-10-CM | POA: Diagnosis not present

## 2019-05-25 DIAGNOSIS — R0789 Other chest pain: Secondary | ICD-10-CM | POA: Diagnosis not present

## 2019-05-25 DIAGNOSIS — M7989 Other specified soft tissue disorders: Secondary | ICD-10-CM | POA: Diagnosis not present

## 2019-05-25 LAB — URINALYSIS, COMPLETE (UACMP) WITH MICROSCOPIC
Bilirubin Urine: NEGATIVE
Glucose, UA: NEGATIVE mg/dL
Hgb urine dipstick: NEGATIVE
Ketones, ur: NEGATIVE mg/dL
Leukocytes,Ua: NEGATIVE
Nitrite: NEGATIVE
Protein, ur: NEGATIVE mg/dL
Specific Gravity, Urine: 1.009 (ref 1.005–1.030)
pH: 7 (ref 5.0–8.0)

## 2019-05-25 LAB — BASIC METABOLIC PANEL
Anion gap: 10 (ref 5–15)
BUN: 8 mg/dL (ref 6–20)
CO2: 27 mmol/L (ref 22–32)
Calcium: 9.2 mg/dL (ref 8.9–10.3)
Chloride: 103 mmol/L (ref 98–111)
Creatinine, Ser: 0.85 mg/dL (ref 0.44–1.00)
GFR calc Af Amer: >60 mL/min (ref >60)
GFR calc non Af Amer: >60 mL/min (ref >60)
Glucose, Bld: 91 mg/dL (ref 70–99)
Potassium: 4.7 mmol/L (ref 3.5–5.1)
Sodium: 140 mmol/L (ref 135–145)

## 2019-05-25 LAB — CBC
HCT: 43.6 % (ref 36.0–46.0)
Hemoglobin: 14.3 g/dL (ref 12.0–15.0)
MCH: 30.6 pg (ref 26.0–34.0)
MCHC: 32.8 g/dL (ref 30.0–36.0)
MCV: 93.4 fL (ref 80.0–100.0)
Platelets: 276 10*3/uL (ref 150–400)
RBC: 4.67 MIL/uL (ref 3.87–5.11)
RDW: 13.1 % (ref 11.5–15.5)
WBC: 5.6 10*3/uL (ref 4.0–10.5)
nRBC: 0 % (ref 0.0–0.2)

## 2019-05-25 LAB — TROPONIN I (HIGH SENSITIVITY): Troponin I (High Sensitivity): 3 ng/L (ref <18)

## 2019-05-25 MED ORDER — NITROFURANTOIN MONOHYD MACRO 100 MG PO CAPS: 100.0000 mg | ORAL_CAPSULE | Freq: Two times a day (BID) | ORAL | 0 refills | Status: AC

## 2019-05-25 MED ORDER — LORAZEPAM 1 MG PO TABS
1.0000 mg | ORAL_TABLET | Freq: Once | ORAL | Status: AC
  Administered 2019-05-25: 1 mg via ORAL
  Filled 2019-05-25: qty 1

## 2019-05-25 MED ORDER — SODIUM CHLORIDE 0.9% FLUSH: 3.0000 mL | Freq: Once | INTRAVENOUS | Status: DC

## 2019-05-25 NOTE — ED Provider Notes (Signed)
[facility] Emergency Department Provider Note  ____________________________________________  Time seen: Approximately 3:27 PM  I have reviewed the triage vital signs and the nursing notes.   HISTORY  Chief Complaint Chest Pain and Back Pain    HPI Lindsey Bautista is a 58 y.o. female that presents to the emergency department for evaluation of body aches for 2 days.  Patient states that she can feel tingling all over her body.  She has had to stay in bed for the last 2 days because her body has hurt and she has not wanted to get out of bed.  She has not had an appetite.  No recent illness.  Patient has a history of anxiety and depression and takes Klonopin but did not take it this morning.  Patient's mother passed away a couple of months ago and she has been anxious since.  No suicidal or homicidal ideations.  No fever, dizziness, shortness breath, chest pain, abdominal pain, weakness.   Past Medical History:  Diagnosis Date  . Atypical chest pain    a. 08/2016 Neg MV.  [name] COPD (chronic obstructive pulmonary disease) ([place])   . Coronary artery disease, non-occlusive    a.  LHC 06/19/2018: Mid LAD 50% with iFR 0.94  . Depression   . Diastolic dysfunction    a. 10/2016 Echo: EF 60-65%, no rwma, Gr1 DD, mild MR, nl LA size, nl RV fxn; b. 05/2018 Echo: EF 60-65%. No rwma. Mild MR. Nl RV fxn.  [name] GERD (gastroesophageal reflux disease)   . Hypotension   . IBS (irritable bowel syndrome)   . Right arm weakness   . Situational syncope   . TIA (transient ischemic attack)     Patient Active Problem List   Diagnosis Date Noted  . Sore throat 05/14/2019  . Advance care planning 04/16/2019  . Yeast infection 11/18/2018  . Slow transit constipation 11/18/2018  . Coronary artery disease, non-occlusive 09/02/2018  . Fibromyalgia 08/29/2017  . Conversion disorder with mixed symptoms 03/30/2016  . Aortic atherosclerosis ([place]) 12/29/2015  . Adrenal nodule ([place]) 12/29/2015   . COPD (chronic obstructive pulmonary disease) ([place]) 12/13/2015  . Gastroesophageal reflux disease without esophagitis   . PAD (peripheral artery disease) ([place]) 10/22/2015  . Hyperlipidemia 10/22/2015  . Cervical paraspinal muscle spasm 09/30/2015  . Benzodiazepine dependence, continuous ([place]) 09/08/2015  . Situational syncope 06/07/2015  . Nicotine dependence, cigarettes, uncomplicated [id number]  . Depression, recurrent ([place]) 12/19/2013  . Anxiety 12/19/2013    Past Surgical History:  Procedure Laterality Date  . COLONOSCOPY WITH PROPOFOL N/A 11/22/2015   Procedure: COLONOSCOPY WITH PROPOFOL;  Surgeon: Manya Silvas, MD;  Location: [facility] ENDOSCOPY;  Service: Endoscopy;  Laterality: N/A;  . ESOPHAGOGASTRODUODENOSCOPY (EGD) WITH PROPOFOL N/A 11/22/2015   Procedure: ESOPHAGOGASTRODUODENOSCOPY (EGD) WITH PROPOFOL;  Surgeon: Manya Silvas, MD;  Location: [facility] ENDOSCOPY;  Service: Endoscopy;  Laterality: N/A;  . INTRAVASCULAR PRESSURE WIRE/FFR STUDY N/A 06/19/2018   Procedure: INTRAVASCULAR PRESSURE WIRE/FFR STUDY;  Surgeon: Nelva [place], MD;  Location: [place] CV LAB;  Service: Cardiovascular;  Laterality: N/A;  . LEFT HEART CATH AND CORONARY ANGIOGRAPHY N/A 06/19/2018   Procedure: LEFT HEART CATH AND CORONARY ANGIOGRAPHY;  Surgeon: Nelva [place], MD;  Location: [place] CV LAB;  Service: Cardiovascular;  Laterality: N/A;  . OOPHORECTOMY    . TONSILLECTOMY    . VAGINAL HYSTERECTOMY      Prior to Admission medications   Medication Sig Start Date End Date Taking? Authorizing Provider  aspirin 81 MG tablet  Take 81 mg by mouth daily.    [provider]  atorvastatin (LIPITOR) 80 MG tablet Take 1 tablet (80 mg total) by mouth daily at 6 PM. 02/05/19   Theora Gianotti, NP  clonazePAM (KLONOPIN) 1 MG tablet Take 1 tablet (1 mg total) by mouth 2 (two) times daily. 05/14/19   [name], Henrine Screws T, NP  EPINEPHrine 0.3 mg/0.3 mL IJ SOAJ injection Inject 0.3 mLs  (0.3 mg total) into the muscle as needed for anaphylaxis. Patient not taking: Reported on 05/14/2019 05/09/19   Duffy Bruce, MD  estradiol (ESTRACE) 1 MG tablet Take 1 tablet (1 mg total) by mouth daily. 04/16/19   [name], MD  famotidine (PEPCID) 20 MG tablet Take 1 tablet (20 mg total) by mouth 2 (two) times daily. 05/14/19   [name], Henrine Screws T, NP  FLUoxetine (PROZAC) 20 MG capsule Take 1 capsule (20 mg total) by mouth daily. 04/16/19   [name], MD  nitrofurantoin, macrocrystal-monohydrate, (MACROBID) 100 MG capsule Take 1 capsule (100 mg total) by mouth 2 (two) times daily for 7 days. 05/25/19 06/01/19  Laban Emperor, PA-C  nitroGLYCERIN (NITROSTAT) 0.4 MG SL tablet Place 1 tablet (0.4 mg total) under the tongue every 5 (five) minutes as needed for chest pain. Patient not taking: Reported on 05/14/2019 04/16/19   [name], MD  polyethylene glycol powder (GLYCOLAX/MIRALAX) 17 GM/SCOOP powder Take 17 g by mouth 2 (two) times daily as needed. 04/16/19   [name], MD    Allergies Flagyl [metronidazole], Penicillin g, Iodinated diagnostic agents, and Flomax [tamsulosin hcl]  Family History  Problem Relation Age of Onset  . Bladder Cancer Mother   . Heart failure Mother   . Renal Disease Mother   . COPD Father   . Heart disease Other   . COPD Other   . Stroke Other   . Breast cancer Paternal Aunt 71  . Colon cancer Brother     Social History Social History   Tobacco Use  . Smoking status: Current Every Day Smoker    Packs/day: 1.00    Years: 39.00    Pack years: 39.00    Types: Cigarettes  . Smokeless tobacco: Never Used  Substance Use Topics  . Alcohol use: No    Alcohol/week: 0.0 standard drinks  . Drug use: No     Review of Systems  Constitutional: No fever/chills ENT: No upper respiratory complaints. Cardiovascular: No chest pain. Respiratory: No cough. No SOB. Gastrointestinal: No abdominal pain.  No nausea, no vomiting.   Musculoskeletal: Positive for body aches. Skin: Negative for rash, abrasions, lacerations, ecchymosis. Neurological: Negative for headaches, numbness   ____________________________________________   PHYSICAL EXAM:  VITAL SIGNS: ED Triage Vitals  Enc Vitals Group     BP 05/25/19 1250 (!) 141/65     Pulse Rate 05/25/19 1250 60     Resp 05/25/19 1250 20     Temp 05/25/19 1250 98.2 F (36.8 C)     Temp Source 05/25/19 1250 Oral     SpO2 05/25/19 1250 97 %     Weight 05/25/19 1250 140 lb (63.5 kg)     Height 05/25/19 1250 5\' 1"  (1.549 m)     Head Circumference --      Peak Flow --      Pain Score 05/25/19 1255 9     Pain Loc --      Pain Edu? --      Excl. in [place]? --  Constitutional: Alert and oriented. Anxious. Eyes: Conjunctivae are normal. PERRL. EOMI. Head: Atraumatic. ENT:      Ears:      Nose: No congestion/rhinnorhea.      Mouth/Throat: Mucous membranes are moist.  Neck: No stridor. Cardiovascular: Normal rate, regular rhythm.  Good peripheral circulation. Respiratory: Normal respiratory effort without tachypnea or retractions. Lungs CTAB. Good air entry to the bases with no decreased or absent breath sounds. Gastrointestinal: Bowel sounds 4 quadrants. Soft and nontender to palpation. No guarding or rigidity. No palpable masses. No distention.  Musculoskeletal: Full range of motion to all extremities. No gross deformities appreciated. Neurologic:  Normal speech and language. No gross focal neurologic deficits are appreciated.  Skin:  Skin is warm, dry and intact. No rash noted. Psychiatric: Mood and affect are normal. Speech and behavior are normal. Patient exhibits appropriate insight and judgement.   ____________________________________________   LABS (all labs ordered are listed, but only abnormal results are displayed)  Labs Reviewed  URINALYSIS, COMPLETE (UACMP) WITH MICROSCOPIC - Abnormal; Notable for the following components:      Result Value    Color, Urine YELLOW (*)    APPearance CLEAR (*)    Bacteria, UA RARE (*)    All other components within normal limits  URINE CULTURE  BASIC METABOLIC PANEL  CBC  TROPONIN I (HIGH SENSITIVITY)  TROPONIN I (HIGH SENSITIVITY)   ____________________________________________  EKG   ____________________________________________  RADIOLOGY Robinette Haines, personally viewed and evaluated these images (plain radiographs) as part of my medical decision making, as well as reviewing the written report by the radiologist.  Dg Chest 2 View  Result Date: 05/25/2019 CLINICAL DATA:  Chest pain and arm pain 2-3 days. EXAM: CHEST - 2 VIEW COMPARISON:  06/17/2018 FINDINGS: Lungs are adequately inflated without focal consolidation or effusion. Cardiomediastinal silhouette and remainder of the exam is unchanged. IMPRESSION: No active cardiopulmonary disease. Electronically Signed   By: Marin Olp M.D.   On: 05/25/2019 14:05   Ct Head Wo Contrast  Result Date: 05/25/2019 CLINICAL DATA:  Headache with left facial tingling and right hand numbness worsening today. Possible trauma. EXAM: CT HEAD WITHOUT CONTRAST TECHNIQUE: Contiguous axial images were obtained from the base of the skull through the vertex without intravenous contrast. COMPARISON:  06/12/2018 FINDINGS: Brain: Ventricles, cisterns and other CSF spaces are normal. There is no mass, mass effect, shift of midline structures or acute hemorrhage. No evidence of acute infarction. Vascular: No hyperdense vessel or unexpected calcification. Skull: Normal. Negative for fracture or focal lesion. Sinuses/Orbits: No acute finding. Other: None. IMPRESSION: Normal head CT. Electronically Signed   By: Marin Olp M.D.   On: 05/25/2019 14:07    ____________________________________________    PROCEDURES  Procedure(s) performed:    Procedures    Medications  sodium chloride flush (NS) 0.9 % injection 3 mL (has no administration in time range)   LORazepam (ATIVAN) tablet 1 mg (1 mg Oral Given 05/25/19 1419)     ____________________________________________   INITIAL IMPRESSION / ASSESSMENT AND PLAN / ED COURSE  Pertinent labs & imaging results that were available during my care of the patient were reviewed by me and considered in my medical decision making (see chart for details).  Review of the [place] CSRS was performed in accordance of the [place] prior to dispensing any controlled drugs.  Patient presented the emergency department for evaluation of body aches for 2 days.  Vital signs and exam are reassuring.  Patient appears very anxious in the room  and I suspect that this is contributing to the patient's symptoms.  On chart review, patient has been diagnosed with fibromyalgia in the past as well.  She was given Ativan and felt better after medication.  No suicidal or homicidal ideations.  Lab work reassuring.  Head CT negative for acute abnormalities.  Chest x-ray negative for acute abnormalities.  Urine shows some rare bacteria and patient will be started on antibiotics for infection.  Urine was sent for culture.  Patient will be discharged home with prescriptions for macrobid. Patient felt improved prior to discharge. She will call primary care this week.  Patient declines referrals to counseling or therapy.  Patient is to follow up with PCP as directed. Patient is given ED precautions to return to the ED for any worsening or new symptoms.   Lindsey Bautista was evaluated in Emergency Department on 05/25/2019 for the symptoms described in the history of present illness. She was evaluated in the context of the global COVID-19 pandemic, which necessitated consideration that the patient might be at risk for infection with the SARS-CoV-2 virus that causes COVID-19. Institutional protocols and algorithms that pertain to the evaluation of patients at risk for COVID-19 are in a state of rapid change based on information released by regulatory bodies  including the CDC and federal and state organizations. These policies and algorithms were followed during the patient's care in the ED.  ____________________________________________  FINAL CLINICAL IMPRESSION(S) / ED DIAGNOSES  Final diagnoses:  Acute cystitis without hematuria  Anxiety  Generalized body aches      NEW MEDICATIONS STARTED DURING THIS VISIT:  ED Discharge Orders         Ordered    nitrofurantoin, macrocrystal-monohydrate, (MACROBID) 100 MG capsule  2 times daily     05/25/19 1503              This chart was dictated using voice recognition software/Dragon. Despite best efforts to proofread, errors can occur which can change the meaning. Any change was purely unintentional.    Laban Emperor, PA-C 05/25/19 2001    Harvest Dark, MD 05/26/19 2053

## 2019-05-25 NOTE — ED Provider Notes (Signed)
-----------------------------------------   2:19 PM on 05/25/2019 -----------------------------------------  Patient seen in conjunction with physician assistant Laban Emperor.  Overall patient appears quite anxious but otherwise well-appearing.  Reassuringly patient's blood work including cardiac enzymes are negative, CT imaging and chest x-ray are clear, EKG reassuring.  Patient is quite anxious, he is concerned that her mother was having vague symptoms and ultimately was diagnosed with bladder cancer after multiple ER visits.  She is tearful at times during the exam.  Highly suspect anxiety to be playing a major role in the patient's current presentation.  We will check urinalysis, treat with 1 mg of oral Ativan and reassess.  Overall patient appears well from a medical standpoint has good follow-up with Dr. Freddi Starr with whom the patient will follow-up this week.   Harvest Dark, MD 05/25/19 1422

## 2019-05-25 NOTE — ED Triage Notes (Signed)
Pt comes into the ED via EMS from home with c/o chest pain and BL, with palpation increased pain. 135/86, HR84, CBG110

## 2019-05-25 NOTE — ED Triage Notes (Signed)
Pt to ED via [facility] from home for chest pain, bilateral arm pain and bilateral shoulder blade pain x 2 days. Pt state that she has had increased weakness. Pt also reports nausea without emesis. Pt is in NAD at this time.

## 2019-05-26 LAB — URINE CULTURE: Culture: <10000 — AB

## 2019-05-29 ENCOUNTER — Ambulatory Visit (INDEPENDENT_AMBULATORY_CARE_PROVIDER_SITE_OTHER): Payer: Medicare Other

## 2019-05-29 DIAGNOSIS — Z0000 Encounter for general adult medical examination without abnormal findings: Secondary | ICD-10-CM | POA: Diagnosis not present

## 2019-05-29 NOTE — Patient Instructions (Signed)
Lindsey Bautista , Thank you for taking time to come for your Medicare Wellness Visit. I appreciate your ongoing commitment to your health goals. Please review the following plan we discussed and let me know if I can assist you in the future.   Screening recommendations/referrals: Colonoscopy: up to date Mammogram: up to date Bone Density: not indicated Recommended yearly ophthalmology/optometry visit for glaucoma screening and checkup Recommended yearly dental visit for hygiene and checkup  Vaccinations: Influenza vaccine: declined Pneumococcal vaccine: not indicated  Tdap vaccine: due, check with your insurance for coverage Shingles vaccine: shingrix eligible     Advanced directives: Please bring a copy of your health care power of attorney and living will to the office at your convenience.  Conditions/risks identified: please call if you need anything   Next appointment: follow up in one year for your annual wellness visit   Preventive Care 40-64 Years, Female Preventive care refers to lifestyle choices and visits with your health care provider that can promote health and wellness. What does preventive care include?  A yearly physical exam. This is also called an annual well check.  Dental exams once or twice a year.  Routine eye exams. Ask your health care provider how often you should have your eyes checked.  Personal lifestyle choices, including:  Daily care of your teeth and gums.  Regular physical activity.  Eating a healthy diet.  Avoiding tobacco and drug use.  Limiting alcohol use.  Practicing safe sex.  Taking low-dose aspirin daily starting at age 86.  Taking vitamin and mineral supplements as recommended by your health care provider. What happens during an annual well check? The services and screenings done by your health care provider during your annual well check will depend on your age, overall health, lifestyle risk factors, and family history of  disease. Counseling  Your health care provider may ask you questions about your:  Alcohol use.  Tobacco use.  Drug use.  Emotional well-being.  Home and relationship well-being.  Sexual activity.  Eating habits.  Work and work Statistician.  Method of birth control.  Menstrual cycle.  Pregnancy history. Screening  You may have the following tests or measurements:  Height, weight, and BMI.  Blood pressure.  Lipid and cholesterol levels. These may be checked every 5 years, or more frequently if you are over 48 years old.  Skin check.  Lung cancer screening. You may have this screening every year starting at age 42 if you have a 30-pack-year history of smoking and currently smoke or have quit within the past 15 years.  Fecal occult blood test (FOBT) of the stool. You may have this test every year starting at age 70.  Flexible sigmoidoscopy or colonoscopy. You may have a sigmoidoscopy every 5 years or a colonoscopy every 10 years starting at age 70.  Hepatitis C blood test.  Hepatitis B blood test.  Sexually transmitted disease (STD) testing.  Diabetes screening. This is done by checking your blood sugar (glucose) after you have not eaten for a while (fasting). You may have this done every 1-3 years.  Mammogram. This may be done every 1-2 years. Talk to your health care provider about when you should start having regular mammograms. This may depend on whether you have a family history of breast cancer.  BRCA-related cancer screening. This may be done if you have a family history of breast, ovarian, tubal, or peritoneal cancers.  Pelvic exam and Pap test. This may be done every 3 years starting  at age 23. Starting at age 58, this may be done every 5 years if you have a Pap test in combination with an HPV test.  Bone density scan. This is done to screen for osteoporosis. You may have this scan if you are at high risk for osteoporosis. Discuss your test results,  treatment options, and if necessary, the need for more tests with your health care provider. Vaccines  Your health care provider may recommend certain vaccines, such as:  Influenza vaccine. This is recommended every year.  Tetanus, diphtheria, and acellular pertussis (Tdap, Td) vaccine. You may need a Td booster every 10 years.  Zoster vaccine. You may need this after age 70.  Pneumococcal 13-valent conjugate (PCV13) vaccine. You may need this if you have certain conditions and were not previously vaccinated.  Pneumococcal polysaccharide (PPSV23) vaccine. You may need one or two doses if you smoke cigarettes or if you have certain conditions. Talk to your health care provider about which screenings and vaccines you need and how often you need them. This information is not intended to replace advice given to you by your health care provider. Make sure you discuss any questions you have with your health care provider. Document Released: 09/03/2015 Document Revised: 04/26/2016 Document Reviewed: 06/08/2015 Elsevier Interactive Patient Education  2017 [place] Prevention in the Home Falls can cause injuries. They can happen to people of all ages. There are many things you can do to make your home safe and to help prevent falls. What can I do on the outside of my home?  Regularly fix the edges of walkways and driveways and fix any cracks.  Remove anything that might make you trip as you walk through a door, such as a raised step or threshold.  Trim any bushes or trees on the path to your home.  Use bright outdoor lighting.  Clear any walking paths of anything that might make someone trip, such as rocks or tools.  Regularly check to see if handrails are loose or broken. Make sure that both sides of any steps have handrails.  Any raised decks and porches should have guardrails on the edges.  Have any leaves, snow, or ice cleared regularly.  Use sand or salt on walking  paths during winter.  Clean up any spills in your garage right away. This includes oil or grease spills. What can I do in the bathroom?  Use night lights.  Install grab bars by the toilet and in the tub and shower. Do not use towel bars as grab bars.  Use non-skid mats or decals in the tub or shower.  If you need to sit down in the shower, use a plastic, non-slip stool.  Keep the floor dry. Clean up any water that spills on the floor as soon as it happens.  Remove soap buildup in the tub or shower regularly.  Attach bath mats securely with double-sided non-slip rug tape.  Do not have throw rugs and other things on the floor that can make you trip. What can I do in the bedroom?  Use night lights.  Make sure that you have a light by your bed that is easy to reach.  Do not use any sheets or blankets that are too big for your bed. They should not hang down onto the floor.  Have a firm chair that has side arms. You can use this for support while you get dressed.  Do not have throw rugs and other  things on the floor that can make you trip. What can I do in the kitchen?  Clean up any spills right away.  Avoid walking on wet floors.  Keep items that you use a lot in easy-to-reach places.  If you need to reach something above you, use a strong step stool that has a grab bar.  Keep electrical cords out of the way.  Do not use floor polish or wax that makes floors slippery. If you must use wax, use non-skid floor wax.  Do not have throw rugs and other things on the floor that can make you trip. What can I do with my stairs?  Do not leave any items on the stairs.  Make sure that there are handrails on both sides of the stairs and use them. Fix handrails that are broken or loose. Make sure that handrails are as long as the stairways.  Check any carpeting to make sure that it is firmly attached to the stairs. Fix any carpet that is loose or worn.  Avoid having throw rugs at  the top or bottom of the stairs. If you do have throw rugs, attach them to the floor with carpet tape.  Make sure that you have a light switch at the top of the stairs and the bottom of the stairs. If you do not have them, ask someone to add them for you. What else can I do to help prevent falls?  Wear shoes that:  Do not have high heels.  Have rubber bottoms.  Are comfortable and fit you well.  Are closed at the toe. Do not wear sandals.  If you use a stepladder:  Make sure that it is fully opened. Do not climb a closed stepladder.  Make sure that both sides of the stepladder are locked into place.  Ask someone to hold it for you, if possible.  Clearly mark and make sure that you can see:  Any grab bars or handrails.  First and last steps.  Where the edge of each step is.  Use tools that help you move around (mobility aids) if they are needed. These include:  Canes.  Walkers.  Scooters.  Crutches.  Turn on the lights when you go into a dark area. Replace any light bulbs as soon as they burn out.  Set up your furniture so you have a clear path. Avoid moving your furniture around.  If any of your floors are uneven, fix them.  If there are any pets around you, be aware of where they are.  Review your medicines with your doctor. Some medicines can make you feel dizzy. This can increase your chance of falling. Ask your doctor what other things that you can do to help prevent falls. This information is not intended to replace advice given to you by your health care provider. Make sure you discuss any questions you have with your health care provider. Document Released: 06/03/2009 Document Revised: 01/13/2016 Document Reviewed: 09/11/2014 Elsevier Interactive Patient Education  2017 [employer].

## 2019-05-29 NOTE — Progress Notes (Signed)
Subjective:   Lindsey Bautista is a 58 y.o. female who presents for Medicare Annual (Subsequent) preventive examination.  This visit is being conducted via phone call  - after an attmept to do on video chat - due to the COVID-19 pandemic. This patient has given me verbal consent via phone to conduct this visit, patient states they are participating from their home address. Some vital signs may be absent or patient reported.   Patient identification: identified by name, DOB, and current address.    Review of Systems:   Cardiac Risk Factors include: advanced age (>93men, >57 women);dyslipidemia;smoking/ tobacco exposure     Objective:     Vitals: There were no vitals taken for this visit.  There is no height or weight on file to calculate BMI.  Advanced Directives 05/25/2019 05/09/2019 06/18/2018 06/17/2018 06/12/2018 06/12/2018 05/27/2018  Does Patient Have a Medical Advance Directive? No No No No No No No  Does patient want to make changes to medical advance directive? - - - - - - -  Would patient like information on creating a medical advance directive? No - Patient declined - No - Patient declined - No - Patient declined - Yes (MAU/Ambulatory/Procedural Areas - Information given)    Tobacco Social History   Tobacco Use  Smoking Status Current Every Day Smoker  . Packs/day: 1.00  . Years: 39.00  . Pack years: 39.00  . Types: Cigarettes  Smokeless Tobacco Never Used     Ready to quit: No Counseling given: Yes   Clinical Intake:  Pre-visit preparation completed: Yes  Pain : No/denies pain     Nutritional Status: BMI 25 -29 Overweight Nutritional Risks: None Diabetes: No  How often do you need to have someone help you when you read instructions, pamphlets, or other written materials from your doctor or pharmacy?: 1 - Never  Interpreter Needed?: No  Information entered by :: Karen Kinnard,LPN  Past Medical History:  Diagnosis Date  . Atypical chest pain    a.  08/2016 Neg MV.  [name] COPD (chronic obstructive pulmonary disease) ([place])   . Coronary artery disease, non-occlusive    a.  LHC 06/19/2018: Mid LAD 50% with iFR 0.94  . Depression   . Diastolic dysfunction    a. 10/2016 Echo: EF 60-65%, no rwma, Gr1 DD, mild MR, nl LA size, nl RV fxn; b. 05/2018 Echo: EF 60-65%. No rwma. Mild MR. Nl RV fxn.  [name] GERD (gastroesophageal reflux disease)   . Hypotension   . IBS (irritable bowel syndrome)   . Right arm weakness   . Situational syncope   . TIA (transient ischemic attack)    Past Surgical History:  Procedure Laterality Date  . COLONOSCOPY WITH PROPOFOL N/A 11/22/2015   Procedure: COLONOSCOPY WITH PROPOFOL;  Surgeon: Manya Silvas, MD;  Location[facility] ENDOSCOPY;  Service: Endoscopy;  Laterality: N/A;  . ESOPHAGOGASTRODUODENOSCOPY (EGD) WITH PROPOFOL N/A 11/22/2015   Procedure: ESOPHAGOGASTRODUODENOSCOPY (EGD) WITH PROPOFOL;  Surgeon: Manya Silvas, MD;  Location: [facility] ENDOSCOPY;  Service: Endoscopy;  Laterality: N/A;  . INTRAVASCULAR PRESSURE WIRE/FFR STUDY N/A 06/19/2018   Procedure: INTRAVASCULAR PRESSURE WIRE/FFR STUDY;  Surgeon: Nelva Bush, MD;  Location: [facility];  Service: Cardiovascular;  Laterality: N/A;  . LEFT HEART CATH AND CORONARY ANGIOGRAPHY N/A 06/19/2018   Procedure: LEFT HEART CATH AND CORONARY ANGIOGRAPHY;  Surgeon: Nelva Bush, MD;  Location: [facility];  Service: Cardiovascular;  Laterality: N/A;  . OOPHORECTOMY    . TONSILLECTOMY    .  VAGINAL HYSTERECTOMY     Family History  Problem Relation Age of Onset  . Bladder Cancer Mother   . Heart failure Mother   . Renal Disease Mother   . COPD Father   . Heart disease Other   . COPD Other   . Stroke Other   . Breast cancer Paternal Aunt 32  . Colon cancer Brother    Social History   Socioeconomic History  . Marital status: Married    Spouse name: Lakeyta Hirakawa  . Number of children: 2  . Years of education: 75  . Highest education  level: High school graduate  Occupational History  . Occupation: Unemployed  Social Needs  . Financial resource strain: Not hard at all  . Food insecurity    Worry: Never true    Inability: Never true  . Transportation needs    Medical: No    Non-medical: No  Tobacco Use  . Smoking status: Current Every Day Smoker    Packs/day: 1.00    Years: 39.00    Pack years: 39.00    Types: Cigarettes  . Smokeless tobacco: Never Used  Substance and Sexual Activity  . Alcohol use: No    Alcohol/week: 0.0 standard drinks  . Drug use: No  . Sexual activity: Yes  Lifestyle  . Physical activity    Days per week: 0 days    Minutes per session: 0 min  . Stress: Not at all  Relationships  . Social Herbalist on phone: Not on file    Gets together: Not on file    Attends religious service: Not on file    Active member of club or organization: Not on file    Attends meetings of clubs or organizations: Not on file    Relationship status: Married  Other Topics Concern  . Not on file  Social History Narrative   Lives at home with husband.    Caffeine use: 1 cup coffee per day    Outpatient Encounter Medications as of 05/29/2019  Medication Sig  . aspirin 81 MG tablet Take 81 mg by mouth daily.  [name] atorvastatin (LIPITOR) 80 MG tablet Take 1 tablet (80 mg total) by mouth daily at 6 PM.  . clonazePAM (KLONOPIN) 1 MG tablet Take 1 tablet (1 mg total) by mouth 2 (two) times daily.  [name] docusate sodium (COLACE) 100 MG capsule Take 100 mg by mouth 2 (two) times daily. Every other day  . EPINEPHrine 0.3 mg/0.3 mL IJ SOAJ injection Inject 0.3 mLs (0.3 mg total) into the muscle as needed for anaphylaxis.  [name] estradiol (ESTRACE) 1 MG tablet Take 1 tablet (1 mg total) by mouth daily.  [name] FLUoxetine (PROZAC) 20 MG capsule Take 1 capsule (20 mg total) by mouth daily.  . nitrofurantoin, macrocrystal-monohydrate, (MACROBID) 100 MG capsule Take 1 capsule (100 mg total) by mouth 2 (two) times daily for  7 days. (Patient not taking: Reported on 05/29/2019)  . nitroGLYCERIN (NITROSTAT) 0.4 MG SL tablet Place 1 tablet (0.4 mg total) under the tongue every 5 (five) minutes as needed for chest pain. (Patient not taking: Reported on 05/14/2019)  . polyethylene glycol powder (GLYCOLAX/MIRALAX) 17 GM/SCOOP powder Take 17 g by mouth 2 (two) times daily as needed.  . [DISCONTINUED] famotidine (PEPCID) 20 MG tablet Take 1 tablet (20 mg total) by mouth 2 (two) times daily. (Patient not taking: Reported on 05/29/2019)   No facility-administered encounter medications on file as of 05/29/2019.  Activities of Daily Living In your present state of health, do you have any difficulty performing the following activities: 05/29/2019 05/14/2019  Hearing? N N  Comment no hearing aids -  Vision? Y Y  Comment eyeglasses -  Difficulty concentrating or making decisions? N Y  Walking or climbing stairs? [name]  Comment takes time -  Dressing or bathing? N N  Doing errands, shopping? Y N  Comment cant drive anymore -  Preparing Food and eating ? N -  Using the Toilet? N -  In the past six months, have you accidently leaked urine? N -  Do you have problems with loss of bowel control? N -  Managing your Medications? N -  Managing your Finances? N -  Housekeeping or managing your Housekeeping? N -  Some recent data might be hidden    Patient Care Team: [name], MD as PCP - General (Family Medicine) Minna Merritts, MD as PCP - Cardiology (Cardiology) Abisogun, Domenica Reamer, MD as Consulting Physician (Endocrinology) Minna Merritts, MD as Consulting Physician (Cardiology) Greg Cutter, [name] as Social Worker (Licensed Clinical Social Worker)    Assessment:   This is a routine wellness examination for Jamita.  Exercise Activities and Dietary recommendations Current Exercise Habits: The patient does not participate in regular exercise at present, Exercise limited by: None identified  Goals    . "I  want to alleviate stress as much as possible" (pt-stated)     Current Barriers:  [name] Mental Health Concerns  . Limited education about appropriate self-care methods to implement into her daily routine to combat depression and stress* . Limited access to caregiver- in terms of caring of her sick mother  . Lacks knowledge of community resource: Available mental health support services  Clinical Social Work Clinical Goal(s):  [name] Over the next 90 days, patient will work with [name] to address needs related to learning healthy stress management  Interventions: . Patient interviewed and appropriate assessments performed . [name] provided talk therapy during session  . Patient was able to identify key triggers that cause her anxiety/stress . Provided mental health counseling with regard to Depression . Discussed plans with patient for ongoing care management follow up and provided patient with direct contact information for care management team . Assisted patient/caregiver with obtaining information about health plan benefits  Patient Self Care Activities:  . Self administers medications as prescribed . Attends all scheduled provider appointments . Calls provider office for new concerns or questions  Initial goal documentation     . Quit smoking / using tobacco     Smoking cessation discussed        Fall Risk: Fall Risk  05/29/2019 03/10/2019 09/02/2018 08/22/2018 06/24/2018  Falls in the past year? 1 0 1 1 1   Number falls in past yr: 0 0 1 1 1   Injury with Fall? 0 0 0 0 0  Risk for fall due to : Impaired balance/gait - - - History of fall(s);Mental status change  Follow up - Falls evaluation completed Falls evaluation completed Falls evaluation completed Falls evaluation completed    [place]:  Any stairs in or around the home? no If so, are there any without handrails? No   Home free of loose throw rugs in walkways, pet beds, electrical cords, etc? Yes   Adequate lighting in your home to reduce risk of falls? Yes   ASSISTIVE DEVICES UTILIZED TO PREVENT FALLS:  Life alert? No  Use of a cane, walker or w/c? No  Grab bars in the bathroom? No  Shower chair or bench in shower? No  Elevated toilet seat or a handicapped toilet? No   DME ORDERS:  DME order needed?  No   TIMED UP AND GO:  Unable to perform    Depression Screen PHQ 2/9 Scores 05/14/2019 09/02/2018 05/27/2018 08/29/2017  PHQ - 2 Score 6 2 2 5   PHQ- 9 Score 22 12 16 16      Cognitive Function     6CIT Screen 05/27/2018 05/09/2017  What Year? 0 points 0 points  What month? 0 points 0 points  What time? 0 points 0 points  Count back from 20 0 points 0 points  Months in reverse 0 points 0 points  Repeat phrase 2 points 6 points  Total Score 2 6    Immunization History  Administered Date(s) Administered  . Influenza,inj,Quad PF,6+ Mos 06/21/2015, 05/09/2017  . Td 08/21/2001    Qualifies for Shingles Vaccine? Yes  Zostavax completed n/a. Due for Shingrix. Education has been provided regarding the importance of this vaccine. Pt has been advised to call insurance company to determine out of pocket expense. Advised may also receive vaccine at local pharmacy or Health Dept. Verbalized acceptance and understanding.  Tdap: Discussed need for TD/TDAP vaccine, patient verbalized understanding that this is not covered as a preventative with there insurance and to call the office if she develops any new skin injuries, ie: cuts, scrapes, bug bites, or open wounds.  Flu Vaccine: declined   Pneumococcal Vaccine: not indicated   Screening Tests Health Maintenance  Topic Date Due  . TETANUS/TDAP  08/22/2011  . INFLUENZA VACCINE  11/19/2019 (Originally 03/22/2019)  . COLONOSCOPY  11/21/2020  . MAMMOGRAM  04/22/2021  . Hepatitis C Screening  Completed  . HIV Screening  Completed    Cancer Screenings:  Colorectal Screening: Completed 11/22/2015. Repeat every 5 years  Mammogram:  Completed 04/23/2019. Repeat every year  Bone Density: not indicated   Lung Cancer Screening: (Low Dose CT Chest recommended if Age 76-80 years, 30 pack-year currently smoking OR have quit w/in 15years.) does qualify.   06/04/2018   Additional Screening:  Hepatitis C Screening: does qualify; Completed 03/30/2016  Vision Screening: Recommended annual ophthalmology exams for early detection of glaucoma and other disorders of the eye. Is the patient up to date with their annual eye exam?  No  Scheduling with [facility]    Dental Screening: Recommended annual dental exams for proper oral hygiene  Community Resource Referral:  CRR required this visit?  No       Plan:  I have personally reviewed and addressed the Medicare Annual Wellness questionnaire and have noted the following in the patient's chart:  A. Medical and social history B. Use of alcohol, tobacco or illicit drugs  C. Current medications and supplements D. Functional ability and status E.  Nutritional status F.  Physical activity G. Advance directives H. List of other physicians I.  Hospitalizations, surgeries, and ER visits in previous 12 months J.  [place] such as hearing and vision if needed, cognitive and depression L. Referrals and appointments   In addition, I have reviewed and discussed with patient certain preventive protocols, quality metrics, and best practice recommendations. A written personalized care plan for preventive services as well as general preventive health recommendations were provided to patient.  Signed,    Bevelyn Ngo, LPN  [id number] Nurse Health Advisor   Nurse Notes:  none

## 2019-06-03 ENCOUNTER — Telehealth: Payer: Self-pay | Admitting: *Deleted

## 2019-06-03 DIAGNOSIS — Z87891 Personal history of nicotine dependence: Secondary | ICD-10-CM

## 2019-06-03 DIAGNOSIS — Z122 Encounter for screening for malignant neoplasm of respiratory organs: Secondary | ICD-10-CM

## 2019-06-03 NOTE — Telephone Encounter (Signed)
Patient has been notified that annual lung cancer screening low dose CT scan is due currently or will be in near future. Confirmed that patient is within the age range of 55-77, and asymptomatic, (no signs or symptoms of lung cancer). Patient denies illness that would prevent curative treatment for lung cancer if found. Verified smoking history, (current, 40 pack year). The shared decision making visit was done 06/04/18. Patient is agreeable for CT scan being scheduled.

## 2019-06-05 ENCOUNTER — Ambulatory Visit
Admission: RE | Admit: 2019-06-05 | Discharge: 2019-06-05 | Disposition: A | Discharge status: Home / Self Care | Payer: Medicare Other | Source: Ambulatory Visit | Attending: Oncology | Admitting: Oncology

## 2019-06-05 ENCOUNTER — Other Ambulatory Visit: Payer: Self-pay

## 2019-06-05 DIAGNOSIS — Z87891 Personal history of nicotine dependence: Secondary | ICD-10-CM | POA: Diagnosis not present

## 2019-06-05 DIAGNOSIS — Z122 Encounter for screening for malignant neoplasm of respiratory organs: Secondary | ICD-10-CM

## 2019-06-05 DIAGNOSIS — F17210 Nicotine dependence, cigarettes, uncomplicated: Secondary | ICD-10-CM | POA: Diagnosis not present

## 2019-06-06 ENCOUNTER — Emergency Department
Admission: EM | Admit: 2019-06-06 | Discharge: 2019-06-06 | Disposition: A | Discharge status: Home / Self Care | Payer: Medicare Other | Attending: Emergency Medicine | Admitting: Emergency Medicine

## 2019-06-06 ENCOUNTER — Other Ambulatory Visit: Payer: Self-pay

## 2019-06-06 ENCOUNTER — Encounter: Payer: Self-pay | Admitting: *Deleted

## 2019-06-06 ENCOUNTER — Emergency Department: Payer: Medicare Other

## 2019-06-06 DIAGNOSIS — J449 Chronic obstructive pulmonary disease, unspecified: Secondary | ICD-10-CM | POA: Insufficient documentation

## 2019-06-06 DIAGNOSIS — R0689 Other abnormalities of breathing: Secondary | ICD-10-CM | POA: Diagnosis not present

## 2019-06-06 DIAGNOSIS — Z79899 Other long term (current) drug therapy: Secondary | ICD-10-CM | POA: Insufficient documentation

## 2019-06-06 DIAGNOSIS — F17210 Nicotine dependence, cigarettes, uncomplicated: Secondary | ICD-10-CM | POA: Insufficient documentation

## 2019-06-06 DIAGNOSIS — I2510 Atherosclerotic heart disease of native coronary artery without angina pectoris: Secondary | ICD-10-CM | POA: Insufficient documentation

## 2019-06-06 DIAGNOSIS — R069 Unspecified abnormalities of breathing: Secondary | ICD-10-CM | POA: Diagnosis not present

## 2019-06-06 DIAGNOSIS — R0600 Dyspnea, unspecified: Secondary | ICD-10-CM | POA: Insufficient documentation

## 2019-06-06 DIAGNOSIS — Z7982 Long term (current) use of aspirin: Secondary | ICD-10-CM | POA: Diagnosis not present

## 2019-06-06 DIAGNOSIS — R0789 Other chest pain: Secondary | ICD-10-CM | POA: Diagnosis not present

## 2019-06-06 DIAGNOSIS — R0602 Shortness of breath: Secondary | ICD-10-CM | POA: Diagnosis not present

## 2019-06-06 DIAGNOSIS — Z8673 Personal history of transient ischemic attack (TIA), and cerebral infarction without residual deficits: Secondary | ICD-10-CM | POA: Diagnosis not present

## 2019-06-06 DIAGNOSIS — Z20828 Contact with and (suspected) exposure to other viral communicable diseases: Secondary | ICD-10-CM | POA: Diagnosis not present

## 2019-06-06 DIAGNOSIS — M542 Cervicalgia: Secondary | ICD-10-CM | POA: Diagnosis not present

## 2019-06-06 DIAGNOSIS — R079 Chest pain, unspecified: Secondary | ICD-10-CM | POA: Diagnosis not present

## 2019-06-06 LAB — CBC WITH DIFFERENTIAL/PLATELET
Abs Immature Granulocytes: 0.03 10*3/uL (ref 0.00–0.07)
Basophils Absolute: 0 10*3/uL (ref 0.0–0.1)
Basophils Relative: 0 %
Eosinophils Absolute: 0.4 10*3/uL (ref 0.0–0.5)
Eosinophils Relative: 4 %
HCT: 41.4 % (ref 36.0–46.0)
Hemoglobin: 14 g/dL (ref 12.0–15.0)
Immature Granulocytes: 0 %
Lymphocytes Relative: 21 %
Lymphs Abs: 2 10*3/uL (ref 0.7–4.0)
MCH: 31 pg (ref 26.0–34.0)
MCHC: 33.8 g/dL (ref 30.0–36.0)
MCV: 91.6 fL (ref 80.0–100.0)
Monocytes Absolute: 0.9 10*3/uL (ref 0.1–1.0)
Monocytes Relative: 9 %
Neutro Abs: 6.3 10*3/uL (ref 1.7–7.7)
Neutrophils Relative %: 66 %
Platelets: 295 10*3/uL (ref 150–400)
RBC: 4.52 MIL/uL (ref 3.87–5.11)
RDW: 12.8 % (ref 11.5–15.5)
WBC: 9.5 10*3/uL (ref 4.0–10.5)
nRBC: 0 % (ref 0.0–0.2)

## 2019-06-06 LAB — BASIC METABOLIC PANEL
Anion gap: 11 (ref 5–15)
BUN: 10 mg/dL (ref 6–20)
CO2: 26 mmol/L (ref 22–32)
Calcium: 8.9 mg/dL (ref 8.9–10.3)
Chloride: 103 mmol/L (ref 98–111)
Creatinine, Ser: 0.77 mg/dL (ref 0.44–1.00)
GFR calc Af Amer: >60 mL/min (ref >60)
GFR calc non Af Amer: >60 mL/min (ref >60)
Glucose, Bld: 105 mg/dL — ABNORMAL HIGH (ref 70–99)
Potassium: 3.8 mmol/L (ref 3.5–5.1)
Sodium: 140 mmol/L (ref 135–145)

## 2019-06-06 LAB — SARS CORONAVIRUS 2 (TAT 6-24 HRS): SARS Coronavirus 2: NEGATIVE

## 2019-06-06 LAB — BRAIN NATRIURETIC PEPTIDE: B Natriuretic Peptide: 32 pg/mL (ref 0.0–100.0)

## 2019-06-06 LAB — TROPONIN I (HIGH SENSITIVITY): Troponin I (High Sensitivity): 4 ng/L (ref <18)

## 2019-06-06 MED ORDER — AZITHROMYCIN 500 MG PO TABS
500.0000 mg | ORAL_TABLET | Freq: Once | ORAL | Status: AC
  Administered 2019-06-06: 500 mg via ORAL
  Filled 2019-06-06: qty 1

## 2019-06-06 MED ORDER — DIPHENHYDRAMINE HCL 50 MG/ML IJ SOLN
50.0000 mg | Freq: Once | INTRAMUSCULAR | Status: AC
  Administered 2019-06-06: 50 mg via INTRAVENOUS
  Filled 2019-06-06: qty 1

## 2019-06-06 MED ORDER — HYDROCORTISONE NA SUCCINATE PF 250 MG IJ SOLR
200.0000 mg | Freq: Once | INTRAMUSCULAR | Status: AC
  Administered 2019-06-06: 200 mg via INTRAVENOUS
  Filled 2019-06-06: qty 200

## 2019-06-06 MED ORDER — AZITHROMYCIN 250 MG PO TABS: ORAL_TABLET | ORAL | 0 refills | Status: AC

## 2019-06-06 NOTE — ED Provider Notes (Signed)
[facility] Emergency Department Provider Note   ____________________________________________   First MD Initiated Contact with Patient 06/06/19 0710     (approximate)  I have reviewed the triage vital signs and the nursing notes.   HISTORY  Chief Complaint Shortness of Breath  HPI Lindsey Bautista is a 58 y.o. female who complains of 3 weeks of left-sided neck pain and some swelling.  It has been getting worse.  She also reports she has had shortness of breath starting last night and continue on today.  She is cannot get enough air.  Seems to get worse if she walks or exerts herself.  She is not having any chest pain or tightness.  She has no pleuritic pain.  She has no swelling in her legs.  She is not running a fever.  She is not coughing.  There seems to be getting caught at the base of her neck.         Past Medical History:  Diagnosis Date  . Atypical chest pain    a. 08/2016 Neg MV.  [name] COPD (chronic obstructive pulmonary disease) ([place])   . Coronary artery disease, non-occlusive    a.  LHC 06/19/2018: Mid LAD 50% with iFR 0.94  . Depression   . Diastolic dysfunction    a. 10/2016 Echo: EF 60-65%, no rwma, Gr1 DD, mild MR, nl LA size, nl RV fxn; b. 05/2018 Echo: EF 60-65%. No rwma. Mild MR. Nl RV fxn.  [name] GERD (gastroesophageal reflux disease)   . Hypotension   . IBS (irritable bowel syndrome)   . Right arm weakness   . Situational syncope   . TIA (transient ischemic attack)     Patient Active Problem List   Diagnosis Date Noted  . Sore throat 05/14/2019  . Advance care planning 04/16/2019  . Yeast infection 11/18/2018  . Slow transit constipation 11/18/2018  . Coronary artery disease, non-occlusive 09/02/2018  . Fibromyalgia 08/29/2017  . Conversion disorder with mixed symptoms 03/30/2016  . Aortic atherosclerosis ([place]) 12/29/2015  . Adrenal nodule ([place]) 12/29/2015  . COPD (chronic obstructive pulmonary disease) ([place]) 12/13/2015  .  Gastroesophageal reflux disease without esophagitis   . PAD (peripheral artery disease) ([place]) 10/22/2015  . Hyperlipidemia 10/22/2015  . Cervical paraspinal muscle spasm 09/30/2015  . Benzodiazepine dependence, continuous ([place]) 09/08/2015  . Situational syncope 06/07/2015  . Nicotine dependence, cigarettes, uncomplicated [id number]  . Depression, recurrent ([place]) 12/19/2013  . Anxiety 12/19/2013    Past Surgical History:  Procedure Laterality Date  . COLONOSCOPY WITH PROPOFOL N/A 11/22/2015   Procedure: COLONOSCOPY WITH PROPOFOL;  Surgeon: Manya Silvas, MD;  Location: [facility] ENDOSCOPY;  Service: Endoscopy;  Laterality: N/A;  . ESOPHAGOGASTRODUODENOSCOPY (EGD) WITH PROPOFOL N/A 11/22/2015   Procedure: ESOPHAGOGASTRODUODENOSCOPY (EGD) WITH PROPOFOL;  Surgeon: Manya Silvas, MD;  Location: [facility] ENDOSCOPY;  Service: Endoscopy;  Laterality: N/A;  . INTRAVASCULAR PRESSURE WIRE/FFR STUDY N/A 06/19/2018   Procedure: INTRAVASCULAR PRESSURE WIRE/FFR STUDY;  Surgeon: Nelva Bush, MD;  Location: [facility];  Service: Cardiovascular;  Laterality: N/A;  . LEFT HEART CATH AND CORONARY ANGIOGRAPHY N/A 06/19/2018   Procedure: LEFT HEART CATH AND CORONARY ANGIOGRAPHY;  Surgeon: Nelva Bush, MD;  Location: [facility];  Service: Cardiovascular;  Laterality: N/A;  . OOPHORECTOMY    . TONSILLECTOMY    . VAGINAL HYSTERECTOMY      Prior to Admission medications   Medication Sig Start Date End Date Taking? Authorizing Provider  aspirin 81 MG tablet Take 81  mg by mouth daily.    [provider]  atorvastatin (LIPITOR) 80 MG tablet Take 1 tablet (80 mg total) by mouth daily at 6 PM. 02/05/19   Theora Gianotti, NP  azithromycin (ZITHROMAX Z-PAK) 250 MG tablet Take 2 tablets (500 mg) on  Day 1,  followed by 1 tablet (250 mg) once daily on Days 2 through 5. 06/06/19 06/11/19  Nena Polio, MD  clonazePAM (KLONOPIN) 1 MG tablet Take 1 tablet (1 mg total) by mouth 2  (two) times daily. 05/14/19   [name], Henrine Screws T, NP  docusate sodium (COLACE) 100 MG capsule Take 100 mg by mouth 2 (two) times daily. Every other day    [provider]  EPINEPHrine 0.3 mg/0.3 mL IJ SOAJ injection Inject 0.3 mLs (0.3 mg total) into the muscle as needed for anaphylaxis. 05/09/19   Duffy Bruce, MD  estradiol (ESTRACE) 1 MG tablet Take 1 tablet (1 mg total) by mouth daily. 04/16/19   [name], MD  FLUoxetine (PROZAC) 20 MG capsule Take 1 capsule (20 mg total) by mouth daily. 04/16/19   [name], MD  nitroGLYCERIN (NITROSTAT) 0.4 MG SL tablet Place 1 tablet (0.4 mg total) under the tongue every 5 (five) minutes as needed for chest pain. Patient not taking: Reported on 05/14/2019 04/16/19   [name], MD  polyethylene glycol powder (GLYCOLAX/MIRALAX) 17 GM/SCOOP powder Take 17 g by mouth 2 (two) times daily as needed. 04/16/19   [name], MD    Allergies Flagyl [metronidazole], Penicillin g, Iodinated diagnostic agents, and Flomax [tamsulosin hcl]  Family History  Problem Relation Age of Onset  . Bladder Cancer Mother   . Heart failure Mother   . Renal Disease Mother   . COPD Father   . Heart disease Other   . COPD Other   . Stroke Other   . Breast cancer Paternal Aunt 17  . Colon cancer Brother     Social History Social History   Tobacco Use  . Smoking status: Current Every Day Smoker    Packs/day: 1.00    Years: 39.00    Pack years: 39.00    Types: Cigarettes  . Smokeless tobacco: Never Used  Substance Use Topics  . Alcohol use: No    Alcohol/week: 0.0 standard drinks  . Drug use: No    Review of Systems  Constitutional: No fever/chills Eyes: No visual changes. ENT: No sore throat. Cardiovascular: Denies chest pain. Respiratory: shortness of breath. Gastrointestinal: No abdominal pain.  No nausea, no vomiting.  No diarrhea.  No constipation. Genitourinary: Negative for dysuria. Musculoskeletal: Negative for back  pain. Skin: Negative for rash. Neurological: Negative for headaches, focal weakness  ____________________________________________   PHYSICAL EXAM:  VITAL SIGNS: ED Triage Vitals  Enc Vitals Group     BP 06/06/19 0643 136/65     Pulse Rate 06/06/19 0643 80     Resp 06/06/19 0643 (!) 24     Temp 06/06/19 0643 98.7 F (37.1 C)     Temp src --      SpO2 06/06/19 0643 94 %     Weight 06/06/19 0644 139 lb 15.9 oz (63.5 kg)     Height 06/06/19 0644 5\' 1"  (1.549 m)     Head Circumference --      Peak Flow --      Pain Score 06/06/19 0644 8     Pain Loc --      Pain Edu? --  Excl. in [place]? --     Constitutional: Alert and oriented. Well appearing and in no acute distress. Eyes: Conjunctivae are normal.  Head: Atraumatic. Nose: No congestion/rhinnorhea. Mouth/Throat: Mucous membranes are moist.  Oropharynx non-erythematous. Neck: No stridor.   Hematological/Lymphatic/Immunilogical: No cervical lymphadenopathy.  There is a suggestion of slight fullness on the left side of the neck however Cardiovascular: Normal rate, regular rhythm. Grossly normal heart sounds.  Good peripheral circulation. Respiratory: Normal respiratory effort.  No retractions. Lungs CTAB. Gastrointestinal: Soft and nontender. No distention. No abdominal bruits. No CVA tenderness. Musculoskeletal: No lower extremity tenderness nor edema.   Neurologic:  Normal speech and language. No gross focal neurologic deficits are appreciated. No gait instability. Skin:  Skin is warm, dry and intact.  ____________________________________________   LABS (all labs ordered are listed, but only abnormal results are displayed)  Labs Reviewed  BASIC METABOLIC PANEL - Abnormal; Notable for the following components:      Result Value   Glucose, Bld 105 (*)    All other components within normal limits  SARS CORONAVIRUS 2 (TAT 6-24 HRS)  CBC WITH DIFFERENTIAL/PLATELET  BRAIN NATRIURETIC PEPTIDE  TROPONIN I (HIGH  SENSITIVITY)   ____________________________________________  EKG  EKG read interpreted by me shows normal sinus rhythm rate of 81 normal axis no acute changes ____________________________________________  RADIOLOGY  ED MD interpretation: Chest x-ray read by radiology reviewed by me shows possible mild vague bibasilar atelectasis possible infiltrate.  CT of the chest yesterday was negative.  I reviewed the films.  Official radiology report(s): Ct Soft Tissue Neck Wo Contrast  Result Date: 06/06/2019 CLINICAL DATA:  Sore throat/stridor with epiglottitis/tonsillitis suspected. Left-sided neck pain for 3 weeks EXAM: CT NECK WITHOUT CONTRAST TECHNIQUE: Multidetector CT imaging of the neck was performed following the standard protocol without intravenous contrast. COMPARISON:  None. FINDINGS: Pharynx and larynx: No evidence of mass or swelling. Salivary glands: No inflammation, mass, or stone. Thyroid: Normal Lymph nodes: None enlarged or abnormal density. Vascular: Scattered atherosclerotic calcification along the aorta and great vessels. Limited intracranial: Negative Visualized orbits: Limited coverage is negative Mastoids and visualized paranasal sinuses: Clear Skeleton: Negative Upper chest: Clear apical lungs. Centrilobular emphysema that is mild IMPRESSION: 1. No acute finding or specific explanation for pain. 2. Aortic Atherosclerosis (ICD10-I70.0) and Emphysema (ICD10-J43.9). Electronically Signed   By: Monte Fantasia M.D.   On: 06/06/2019 08:48   Dg Chest Port 1 View  Result Date: 06/06/2019 CLINICAL DATA:  Shortness of breath. EXAM: PORTABLE CHEST 1 VIEW COMPARISON:  CT 06/05/2019.  Chest x-ray 06/17/2018. FINDINGS: Mediastinum hilar structures normal. Heart size normal. Mild bibasilar atelectasis/infiltrates. No pleural effusion or pneumothorax. No acute bony abnormality. IMPRESSION: Mild bibasilar atelectasis/infiltrates. Electronically Signed   By: Marcello Moores  Register   On: 06/06/2019  07:40    ____________________________________________   PROCEDURES  Procedure(s) performed (including Critical Care):  Procedures   ____________________________________________   INITIAL IMPRESSION / ASSESSMENT AND PLAN / ED COURSE  Patient ambulated O2 sats did not go below 93.  Patient sitting in the bed at 95-93 .  Lungs sound clear.  White count is normal she is not febrile not sure she has a pneumonia or not does not clinically appear but she does.  However I will give her Zithromax Z-Pak because of the x-ray reading and she will return if she is worse at all.  We will call her when she can call back and find out the results of the Covid test that I did.  She feels comfortable  with this.  She is also reassured by the fact that the CT was negative.              ____________________________________________   FINAL CLINICAL IMPRESSION(S) / ED DIAGNOSES  Final diagnoses:  Dyspnea, unspecified type     ED Discharge Orders         Ordered    azithromycin (ZITHROMAX Z-PAK) 250 MG tablet     06/06/19 1054           Note:  This document was prepared using Dragon voice recognition software and may include unintentional dictation errors.    Nena Polio, MD 06/06/19 [id number]

## 2019-06-06 NOTE — ED Notes (Signed)
Discharge pending toleration of Azithromycin.

## 2019-06-06 NOTE — ED Triage Notes (Signed)
Pt arrived via EMS with c/o increasing shortness of breath over the night. She had a cough when she went to bed but it continued to worsen. Also now with chest pain central heaviness and some pain and swelling to the left side of neck x 3 weeks

## 2019-06-06 NOTE — ED Notes (Signed)
Ambulated pt 3 x laps around the room, pt O2 sats remained in the 93 to 95 range throughout. Assisted pt to toilet during ambulation.

## 2019-06-06 NOTE — ED Notes (Signed)
Patient transported to CT 

## 2019-06-06 NOTE — Discharge Instructions (Addendum)
We have sent the Covid test but it still pending.  It should be back by tomorrow.  Chest x-ray shows the possibility of a very slight pneumonia.  Nothing else looks like pneumonia though.  I will give you some antibiotics just in case.  Please return if you are worse at all.  Even if you get more short of breath at night you can come back at that time by calling the ambulance.  The antibiotic I will give you a Zithromax 2 today and 1 every day after that.

## 2019-06-09 ENCOUNTER — Encounter: Payer: Self-pay | Admitting: *Deleted

## 2019-06-15 NOTE — Progress Notes (Signed)
Cardiology Office Note    Date:  06/17/2019   ID:  Lindsey Bautista, DOB 07-01-1961, MRN [id number]  PCP:  [name], MD  Cardiologist:  Ida Rogue, MD  Electrophysiologist:  None   Chief Complaint: Follow-up  History of Present Illness:   Lindsey Bautista is a 58 y.o. female with history of nonobstructive CAD, aortic atherosclerosis noted on prior CT imaging, chronic atypical chest pain, diastolic dysfunction, HLD, COPD, anxiety, and depression who presents for follow-up of the above.  Patient was evaluated in the hospital in 05/2018 with chest pain and ruled out.  She underwent diagnostic cath which showed 50% mid LAD stenosis with FFR being normal.  Echo at that time showed normal LV systolic function.  She was initially placed on Imdur though this was discontinued secondary to headaches.  She has continued to note intermittent chest discomfort as well as some dyspnea on exertion.  She was last seen in the office in 01/2019 for evaluation of these complaints.  At that time, she was noted to be noncompliant with statin therapy.  Patient felt her symptoms of chest discomfort were in the setting of her mother recently passing away.  EKG was nonacute.  No further cardiac testing was recommended at that time.  Since her last visit with [place] she has been seen in the ED with several allergic reactions in the setting of recently started Flomax and Flagyl.  She was seen in the ED on 05/25/2019 with body aches and tingling "all over her body."  She indicated she had not been out of bed for 2 days and had not had an appetite.  High-sensitivity troponin of 3, CBC, and BMP unrevealing.  CT head nonacute.  Chest x-ray nonacute.  EKG nonacute.  Patient was diagnosed with anxiety.  She returned to the ED on 06/06/2019 with a 3-week history of left-sided neck pain and swelling that she felt like had been getting worse as well as some shortness of breath.  COVID-19 was negative, high-sensitivity troponin 4,  BNP 32, CBC/BMP unrevealing, chest x-ray showed mild bibasilar atelectasis/infiltrates, CT neck showed no acute finding or specific explanation for patient's symptoms with noted aortic atherosclerosis and emphysema.  Of note, the patient had just undergone a lung cancer screening CT the day prior to her ED visit which showed no acute pathology.  EKG was nonacute.  Patient was treated with azithromycin.  She comes in today continuing to note almost daily substernal chest heaviness that is exertional and limits her functional capacity when trying to clean up her yard.  There is some associated left arm pain with this as well as some shortness of breath.  Symptoms feels somewhat different than her prior episodes and that this is more of a pressure.  She is very concerned with regards to her prior known nonobstructive disease as outlined above.  She has been compliant with aspirin though has not been taking Lipitor on a daily basis.  She indicates she will now start to do that.  She continues to struggle with underlying anxiety and depression.  No falls, BRBPR, melena, lower extremity swelling, abdominal distention, orthopnea, PND, early satiety.  No dizziness, presyncope, or syncope.  Secondly, she notes bilateral lower extremity cramping with overexertion.  No prior lower extremity noninvasive imaging lastly, she asks if her carotids are okay.  Review of epic shows the patient had less than 50% left internal carotid artery stenosis and minimal plaquing of the right internal carotid artery just 1 year  prior.  Past Medical History:  Diagnosis Date   Atypical chest pain    a. 08/2016 Neg MV.   COPD (chronic obstructive pulmonary disease) (HCC)    Coronary artery disease, non-occlusive    a.  LHC 06/19/2018: Mid LAD 50% with iFR 0.94   Depression    Diastolic dysfunction    a. 10/2016 Echo: EF 60-65%, no rwma, Gr1 DD, mild MR, nl LA size, nl RV fxn; b. 05/2018 Echo: EF 60-65%. No rwma. Mild MR. Nl RV fxn.     GERD (gastroesophageal reflux disease)    Hypotension    IBS (irritable bowel syndrome)    Right arm weakness    Situational syncope    TIA (transient ischemic attack)     Past Surgical History:  Procedure Laterality Date   COLONOSCOPY WITH PROPOFOL N/A 11/22/2015   Procedure: COLONOSCOPY WITH PROPOFOL;  Surgeon: Manya Silvas, MD;  Location[facility] ENDOSCOPY;  Service: Endoscopy;  Laterality: N/A;   ESOPHAGOGASTRODUODENOSCOPY (EGD) WITH PROPOFOL N/A 11/22/2015   Procedure: ESOPHAGOGASTRODUODENOSCOPY (EGD) WITH PROPOFOL;  Surgeon: Manya Silvas, MD;  Location: [facility] ENDOSCOPY;  Service: Endoscopy;  Laterality: N/A;   INTRAVASCULAR PRESSURE WIRE/FFR STUDY N/A 06/19/2018   Procedure: INTRAVASCULAR PRESSURE WIRE/FFR STUDY;  Surgeon: Nelva Bush, MD;  Location: [place] CV LAB;  Service: Cardiovascular;  Laterality: N/A;   LEFT HEART CATH AND CORONARY ANGIOGRAPHY N/A 06/19/2018   Procedure: LEFT HEART CATH AND CORONARY ANGIOGRAPHY;  Surgeon: Nelva Bush, MD;  Location: [place] CV LAB;  Service: Cardiovascular;  Laterality: N/A;   OOPHORECTOMY     TONSILLECTOMY     VAGINAL HYSTERECTOMY      Current Medications: Current Meds  Medication Sig   aspirin 81 MG tablet Take 81 mg by mouth daily.   atorvastatin (LIPITOR) 80 MG tablet Take 1 tablet (80 mg total) by mouth daily at 6 PM.   clonazePAM (KLONOPIN) 1 MG tablet Take 1 tablet (1 mg total) by mouth 2 (two) times daily.   docusate sodium (COLACE) 100 MG capsule Take 100 mg by mouth 2 (two) times daily. Every other day   EPINEPHrine 0.3 mg/0.3 mL IJ SOAJ injection Inject 0.3 mLs (0.3 mg total) into the muscle as needed for anaphylaxis.   estradiol (ESTRACE) 1 MG tablet Take 1 tablet (1 mg total) by mouth daily.   FLUoxetine (PROZAC) 20 MG capsule Take 1 capsule (20 mg total) by mouth daily.   nitroGLYCERIN (NITROSTAT) 0.4 MG SL tablet Place 1 tablet (0.4 mg total) under the tongue every 5 (five)  minutes as needed for chest pain.   polyethylene glycol powder (GLYCOLAX/MIRALAX) 17 GM/SCOOP powder Take 17 g by mouth 2 (two) times daily as needed.    Allergies:   Flagyl [metronidazole], Penicillin g, Iodinated diagnostic agents, and Flomax [tamsulosin hcl]   Social History   Socioeconomic History   Marital status: Married    Spouse name: Jenetta Downer Bulthuis   Number of children: 2   Years of education: 12   Highest education level: High school graduate  Occupational History   Occupation: Unemployed  Scientist, product/process development strain: Not hard at all   Food insecurity    Worry: Never true    Inability: Never true   Transportation needs    Medical: No    Non-medical: No  Tobacco Use   Smoking status: Current Every Day Smoker    Packs/day: 1.00    Years: 39.00    Pack years: 39.00    Types: Cigarettes  Smokeless tobacco: Never Used  Substance and Sexual Activity   Alcohol use: No    Alcohol/week: 0.0 standard drinks   Drug use: No   Sexual activity: Yes  Lifestyle   Physical activity    Days per week: 0 days    Minutes per session: 0 min   Stress: Not at all  Relationships   Social connections    Talks on phone: Not on file    Gets together: Not on file    Attends religious service: Not on file    Active member of club or organization: Not on file    Attends meetings of clubs or organizations: Not on file    Relationship status: Married  Other Topics Concern   Not on file  Social History Narrative   Lives at home with husband.    Caffeine use: 1 cup coffee per day     Family History:  The patient's family history includes Bladder Cancer in her mother; Breast cancer (age of onset: 11) in her paternal aunt; COPD in her father and another family member; Colon cancer in her brother; Heart disease in an other family member; Heart failure in her mother; Renal Disease in her mother; Stroke in an other family member.  ROS:   Review of  Systems  Constitutional: Positive for malaise/fatigue. Negative for chills, diaphoresis, fever and weight loss.  HENT: Negative for congestion.   Eyes: Negative for discharge and redness.  Respiratory: Positive for shortness of breath. Negative for cough, hemoptysis, sputum production and wheezing.   Cardiovascular: Positive for chest pain and claudication. Negative for palpitations, orthopnea, leg swelling and PND.  Gastrointestinal: Negative for abdominal pain, blood in stool, heartburn, melena, nausea and vomiting.  Genitourinary: Negative for hematuria.  Musculoskeletal: Negative for falls and myalgias.  Skin: Negative for rash.  Neurological: Positive for weakness. Negative for dizziness, tingling, tremors, sensory change, speech change, focal weakness and loss of consciousness.  Endo/Heme/Allergies: Does not bruise/bleed easily.  Psychiatric/Behavioral: Negative for substance abuse. The patient is nervous/anxious.   All other systems reviewed and are negative.    EKGs/Labs/Other Studies Reviewed:    Studies reviewed were summarized above. The additional studies were reviewed today: As above.   EKG:  EKG is ordered today.  The EKG ordered today demonstrates sinus bradycardia, 56 bpm, low voltage QRS, no acute ST-T changes, unchanged from prior  Recent Labs: 05/14/2019: ALT 7; TSH 1.100 06/06/2019: B Natriuretic Peptide 32.0; BUN 10; Creatinine, Ser 0.77; Hemoglobin 14.0; Platelets 295; Potassium 3.8; Sodium 140  Recent Lipid Panel    Component Value Date/Time   CHOL 191 05/14/2019 1401   CHOL 164 09/02/2018 1056   CHOL 203 (H) 11/28/2013 0424   TRIG 157 (H) 05/14/2019 1401   TRIG 76 09/02/2018 1056   TRIG 140 11/28/2013 0424   HDL 47 05/14/2019 1401   HDL 44 11/28/2013 0424   CHOLHDL 4.1 05/14/2019 1401   CHOLHDL 4.9 06/13/2018 0455   VLDL 15 09/02/2018 1056   VLDL 28 11/28/2013 0424   LDLCALC 116 (H) 05/14/2019 1401   LDLCALC 131 (H) 11/28/2013 0424    PHYSICAL  EXAM:    VS:  BP 120/80 (BP Location: Left Arm, Patient Position: Sitting, Cuff Size: Normal)    Pulse (!) 56    Ht 5\' 1"  (1.549 m)    Wt 144 lb 12 oz (65.7 kg)    SpO2 98%    BMI 27.35 kg/m   BMI: Body mass index is 27.35 kg/m.  Physical Exam  Constitutional: She is oriented to person, place, and time. She appears well-developed and well-nourished.  HENT:  Head: Normocephalic and atraumatic.  Eyes: Right eye exhibits no discharge. Left eye exhibits no discharge.  Neck: Normal range of motion. No JVD present.  Cardiovascular: Regular rhythm, S1 normal, S2 normal and normal heart sounds. Bradycardia present. Exam reveals no distant heart sounds, no friction rub, no midsystolic click and no opening snap.  No murmur heard. Pulses:      Posterior tibial pulses are 1+ on the right side and 1+ on the left side.  Pulmonary/Chest: Effort normal and breath sounds normal. No respiratory distress. She has no decreased breath sounds. She has no wheezes. She has no rales. She exhibits no tenderness.  Abdominal: Soft. She exhibits no distension. There is no abdominal tenderness.  Musculoskeletal:        General: No edema.  Neurological: She is alert and oriented to person, place, and time.  Skin: Skin is warm and dry. No cyanosis. Nails show no clubbing.  Psychiatric: She has a normal mood and affect. Her speech is normal and behavior is normal. Judgment and thought content normal.    Wt Readings from Last 3 Encounters:  06/17/19 144 lb 12 oz (65.7 kg)  06/06/19 139 lb 15.9 oz (63.5 kg)  06/05/19 140 lb (63.5 kg)     ASSESSMENT & PLAN:   1. Nonobstructive CAD with chronic chest pain with moderate risk for cardiac etiology/dyspnea: Currently chest pain-free.  Patient has a long history of chronic atypical chest pain however over the past couple of months she has noted a more heaviness that seems to be associated with exertion.  She is quite concerned with regards to her previously noted 50% mid LAD  stenosis.  She does have a contrast allergy and in this setting we have agreed to defer coronary CTA and proceed with Lexiscan Myoview to evaluate for high risk ischemia.  She will continue aspirin 81 mg daily.  She will try to be more compliant with atorvastatin as outlined below.  2. Aortic atherosclerosis: Noted on prior CT chest earlier this month.  When patient was seen in 01/2019 she had self discontinued her Lipitor though was agreeable to restarting this at that time.  It also appeared she had not been taking Zetia which was also previously prescribed.  Most recent LDL of 116 from 04/2019.  3. Lower extremity cramping: Patient has noted a several month history of cramping along the bilateral lower extremities with exertion.  Schedule lower extremity arterial ultrasound/ABI.  Continue aspirin with recommendation to be compliant with atorvastatin.  4. Hyperlipidemia: LDL of 116 from 04/2019 as outlined above.  She continues to be noncompliant with statin therapy.  Given known coronary artery calcium as outlined above goal LDL is less than 70.  She indicates she will now be compliant with atorvastatin.  Recommend rechecking fasting lipid panel and liver function in 8 weeks, if she has been compliant with therapy.  5. Anxiety/depression: Suspect her underlying anxiety is playing a role in her overall presentation.  Recommend she follow-up with PCP.  Disposition: F/u with Dr. Rockey Situ or an APP in 1 month.   Medication Adjustments/Labs and Tests Ordered: Current medicines are reviewed at length with the patient today.  Concerns regarding medicines are outlined above. Medication changes, Labs and Tests ordered today are summarized above and listed in the Patient Instructions accessible in Encounters.   Signed, Christell Faith, PA-C 06/17/2019 2:23 PM     [facility] -  [address], [place] [address] [phone]

## 2019-06-17 ENCOUNTER — Other Ambulatory Visit: Payer: Self-pay

## 2019-06-17 ENCOUNTER — Encounter: Payer: Self-pay | Admitting: Physician Assistant

## 2019-06-17 ENCOUNTER — Ambulatory Visit (INDEPENDENT_AMBULATORY_CARE_PROVIDER_SITE_OTHER): Payer: Medicare Other | Admitting: Physician Assistant

## 2019-06-17 VITALS — BP 120/80 | HR 56 | Ht 61.0 in | Wt 144.8 lb

## 2019-06-17 DIAGNOSIS — I2510 Atherosclerotic heart disease of native coronary artery without angina pectoris: Secondary | ICD-10-CM

## 2019-06-17 DIAGNOSIS — I25118 Atherosclerotic heart disease of native coronary artery with other forms of angina pectoris: Secondary | ICD-10-CM | POA: Diagnosis not present

## 2019-06-17 DIAGNOSIS — F419 Anxiety disorder, unspecified: Secondary | ICD-10-CM | POA: Diagnosis not present

## 2019-06-17 DIAGNOSIS — R0602 Shortness of breath: Secondary | ICD-10-CM | POA: Diagnosis not present

## 2019-06-17 DIAGNOSIS — R252 Cramp and spasm: Secondary | ICD-10-CM | POA: Diagnosis not present

## 2019-06-17 DIAGNOSIS — I700 Atherosclerosis of aorta: Secondary | ICD-10-CM | POA: Diagnosis not present

## 2019-06-17 DIAGNOSIS — R079 Chest pain, unspecified: Secondary | ICD-10-CM

## 2019-06-17 DIAGNOSIS — E785 Hyperlipidemia, unspecified: Secondary | ICD-10-CM

## 2019-06-17 NOTE — Patient Instructions (Addendum)
Medication Instructions:  Your physician recommends that you continue on your current medications as directed. Please refer to the Current Medication list given to you today.  *If you need a refill on your cardiac medications before your next appointment, please call your pharmacy*  Lab Work: None ordered  If you have labs (blood work) drawn today and your tests are completely normal, you will receive your results only by: [name] MyChart Message (if you have MyChart) OR . A paper copy in the mail If you have any lab test that is abnormal or we need to change your treatment, we will call you to review the results.  Testing/Procedures: 1- [place]  Your caregiver has ordered a Stress Test with nuclear imaging. The purpose of this test is to evaluate the blood supply to your heart muscle. This procedure is referred to as a "Non-Invasive Stress Test." This is because other than having an IV started in your vein, nothing is inserted or "invades" your body. Cardiac stress tests are done to find areas of poor blood flow to the heart by determining the extent of coronary artery disease (CAD). Some patients exercise on a treadmill, which naturally increases the blood flow to your heart, while others who are  unable to walk on a treadmill due to physical limitations have a pharmacologic/chemical stress agent called Lexiscan . This medicine will mimic walking on a treadmill by temporarily increasing your coronary blood flow.   Please note: these test may take anywhere between 2-4 hours to complete  PLEASE REPORT TO [place] AT THE FIRST DESK WILL DIRECT YOU WHERE TO GO  Date of Procedure:_____________________________________  Arrival Time for Procedure:______________________________  Instructions regarding medication:   _____:  Hold other medications as  follows:_________________________________________________________________________________________________________________________________________________________________________________________________________________________________________________________________________________________  PLEASE NOTIFY THE OFFICE AT LEAST 24 HOURS IN ADVANCE IF YOU ARE UNABLE TO KEEP YOUR APPOINTMENT.  [phone] AND  PLEASE NOTIFY NUCLEAR MEDICINE AT [facility] AT LEAST 24 HOURS IN ADVANCE IF YOU ARE UNABLE TO KEEP YOUR APPOINTMENT. [phone]  How to prepare for your Myoview test:  1. Do not eat or drink after midnight 2. No caffeine for 24 hours prior to test 3. No smoking 24 hours prior to test. 4. Your medication may be taken with water.  If your doctor stopped a medication because of this test, do not take that medication. 5. Ladies, please do not wear dresses.  Skirts or pants are appropriate. Please wear a short sleeve shirt. 6. No perfume, cologne or lotion. 7. Wear comfortable walking shoes. No heels!   2- Lower Extremity ultrasound Date________ Time_________ @ [place] office Your physician has requested that you have a lower extremity arterial exercise duplex. During this test, exercise and ultrasound are used to evaluate arterial blood flow in the legs. Allow one hour for this exam. There are no restrictions or special instructions.    Follow-Up: At [facility], you and your health needs are our priority.  As part of our continuing mission to provide you with exceptional heart care, we have created designated Provider Care Teams.  These Care Teams include your primary Cardiologist (physician) and Advanced Practice Providers (APPs -  Physician Assistants and Nurse Practitioners) who all work together to provide you with the care you need, when you need it.  Your next appointment:   4 weeks  The format for your next appointment:   In Person  Provider:    You may see Ida Rogue, MD  or Christell Faith, PA-C.

## 2019-06-27 ENCOUNTER — Telehealth: Payer: Self-pay

## 2019-06-27 NOTE — Telephone Encounter (Signed)
Copied from [place] [id number]. Topic: General - Call Back - No Documentation >> Jun 27, 2019  3:25 PM Erick Blinks wrote: Reason for CRM: Pt called and is requesting to speak to Nurse  Pharmacy needs correspondence from PCP before pt can receive her refills. Pt has UTI (for a while) pt had allergic reactions from medications Microbid + Diflucan  Best contact: [phone] >> Jun 27, 2019  4:09 PM Erick Blinks wrote: Bailey Mech called back from Pharmacy, best contact: [phone] requesting to speak with clinic    Called and spoke to patient. She states she is still having discharge and urine issues. States she has been dealing with this for a while and wants to know if she can have another rough of Macrobid and Diflucan sent to [facility].

## 2019-06-27 NOTE — Telephone Encounter (Signed)
Called and spoke to patient. Explained Jolene's message and patient refuses to go to UC over the weekend. Appointment scheduled for Monday with Jolene. Patient asked if something could be sent in to get her through the weekend. I told the patient that I would ask Jolene but could not guarantee as a urine sample must be collected from her. Advised patient to check with her pharmacy in a little bit as it is after 5 pm and our office is closed.

## 2019-06-27 NOTE — Telephone Encounter (Signed)
I would recommend she go to urgent care this evening or tomorrow prior to treatment.  It is better to have urine tested prior to treatment to ensure we are treating with correct abx therapy.  Thank you.

## 2019-06-30 ENCOUNTER — Telehealth: Payer: Self-pay | Admitting: Nurse Practitioner

## 2019-06-30 ENCOUNTER — Ambulatory Visit (INDEPENDENT_AMBULATORY_CARE_PROVIDER_SITE_OTHER): Payer: Medicare Other | Admitting: Nurse Practitioner

## 2019-06-30 ENCOUNTER — Other Ambulatory Visit: Payer: Self-pay

## 2019-06-30 ENCOUNTER — Telehealth: Payer: Self-pay | Admitting: Cardiovascular Disease

## 2019-06-30 ENCOUNTER — Encounter: Payer: Self-pay | Admitting: Nurse Practitioner

## 2019-06-30 VITALS — BP 110/69 | HR 60 | Temp 98.6°F | Ht 61.0 in | Wt 144.0 lb

## 2019-06-30 DIAGNOSIS — B9689 Other specified bacterial agents as the cause of diseases classified elsewhere: Secondary | ICD-10-CM

## 2019-06-30 DIAGNOSIS — J432 Centrilobular emphysema: Secondary | ICD-10-CM

## 2019-06-30 DIAGNOSIS — N760 Acute vaginitis: Secondary | ICD-10-CM

## 2019-06-30 DIAGNOSIS — I2510 Atherosclerotic heart disease of native coronary artery without angina pectoris: Secondary | ICD-10-CM

## 2019-06-30 DIAGNOSIS — R300 Dysuria: Secondary | ICD-10-CM | POA: Diagnosis not present

## 2019-06-30 LAB — WET PREP FOR TRICH, YEAST, CLUE
Clue Cell Exam: POSITIVE — AB
Trichomonas Exam: NEGATIVE
Yeast Exam: NEGATIVE

## 2019-06-30 MED ORDER — CLINDAMYCIN PHOSPHATE 2 % VA CREA: 1.0000 | TOPICAL_CREAM | Freq: Every day | VAGINAL | 0 refills | Status: DC

## 2019-06-30 NOTE — Chronic Care Management (AMB) (Signed)
  Chronic Care Management   Note  06/30/2019 Name: [name] MRN: [id number] DOB: [birth year]  [name] is a 58 y.o. year old female who is a primary care patient of Crissman, Jeannette How, MD. KIMBERLIN PALA is currently enrolled in care management services. An additional referral for Pharmacist was placed.   Follow up plan: Telephone appointment with CCM team member scheduled for:08/12/2019  Glenna Durand LPN Nurse Health Advisor . [place]  ??[email or web address] ??[phone]

## 2019-06-30 NOTE — Patient Instructions (Signed)

## 2019-06-30 NOTE — Progress Notes (Signed)
BP 110/69   Pulse 60   Temp 98.6 F (37 C) (Oral)   Ht 5\' 1"  (1.549 m)   Wt 144 lb (65.3 kg)   SpO2 98%   BMI 27.21 kg/m    Subjective:    Patient ID: Lindsey Bautista, female    DOB: 01/26/1961, 58 y.o.   MRN: [id number]  HPI: Lindsey Bautista is a 58 y.o. female  Chief Complaint  Patient presents with  . Vaginal Discharge    strong smell urine x over 2-3 months   VAGINAL DISCHARGE Not sexually active in 2 years. Started having symptoms last week and was concerned for UTI.  Was treated about a month back for yeast infection.  Denies placing anything in vagina, such as douching.  Is on oral Estrace, has been on for 23 years. Duration: days Discharge description: yellow  Pruritus: no Dysuria: yes Malodorous: yes Urinary frequency: no Fevers: no Abdominal pain: no  Sexual activity: monogamous History of sexually transmitted diseases: no Recent antibiotic use: no Context: worsening  Treatments attempted: none  Relevant past medical, surgical, family and social history reviewed and updated as indicated. Interim medical history since our last visit reviewed. Allergies and medications reviewed and updated.  Review of Systems  Constitutional: Negative for activity change, appetite change, diaphoresis, fatigue and fever.  Respiratory: Negative for cough, chest tightness and shortness of breath.   Cardiovascular: Negative for chest pain, palpitations and leg swelling.  Gastrointestinal: Negative for abdominal distention, abdominal pain, constipation, diarrhea, nausea and vomiting.  Genitourinary: Positive for dysuria and vaginal discharge. Negative for frequency, hematuria, pelvic pain, urgency, vaginal bleeding and vaginal pain.  Psychiatric/Behavioral: Negative.     Per HPI unless specifically indicated above     Objective:    BP 110/69   Pulse 60   Temp 98.6 F (37 C) (Oral)   Ht 5\' 1"  (1.549 m)   Wt 144 lb (65.3 kg)   SpO2 98%   BMI 27.21 kg/m   Wt Readings  from Last 3 Encounters:  06/30/19 144 lb (65.3 kg)  06/17/19 144 lb 12 oz (65.7 kg)  06/06/19 139 lb 15.9 oz (63.5 kg)    Physical Exam Vitals signs and nursing note reviewed.  Constitutional:      General: She is awake. She is not in acute distress.    Appearance: She is well-developed. She is not ill-appearing.  HENT:     Head: Normocephalic.     Right Ear: Hearing normal.     Left Ear: Hearing normal.  Eyes:     General: Lids are normal.        Right eye: No discharge.        Left eye: No discharge.     Conjunctiva/sclera: Conjunctivae normal.     Pupils: Pupils are equal, round, and reactive to light.  Neck:     Musculoskeletal: Normal range of motion and neck supple.  Cardiovascular:     Rate and Rhythm: Normal rate and regular rhythm.     Heart sounds: Normal heart sounds. No murmur. No gallop.   Pulmonary:     Effort: Pulmonary effort is normal. No accessory muscle usage or respiratory distress.     Breath sounds: Normal breath sounds.  Abdominal:     General: Bowel sounds are normal.     Palpations: Abdomen is soft. There is no hepatomegaly or splenomegaly.     Tenderness: There is no abdominal tenderness. There is no right CVA tenderness or left CVA tenderness.  Genitourinary:    Comments: Self swab performed, no vaginal exam this visit. Musculoskeletal:     Right lower leg: No edema.     Left lower leg: No edema.  Skin:    General: Skin is warm and dry.  Neurological:     Mental Status: She is alert and oriented to person, place, and time.  Psychiatric:        Attention and Perception: Attention normal.        Mood and Affect: Mood normal.        Behavior: Behavior normal. Behavior is cooperative.        Thought Content: Thought content normal.        Judgment: Judgment normal.     Results for orders placed or performed during the hospital encounter of 06/06/19  SARS CORONAVIRUS 2 (TAT 6-24 HRS) Nasopharyngeal Nasopharyngeal Swab   Specimen:  Nasopharyngeal Swab  Result Value Ref Range   SARS Coronavirus 2 NEGATIVE NEGATIVE  CBC with Differential  Result Value Ref Range   WBC 9.5 4.0 - 10.5 K/uL   RBC 4.52 3.87 - 5.11 MIL/uL   Hemoglobin 14.0 12.0 - 15.0 g/dL   HCT 41.4 36.0 - 46.0 %   MCV 91.6 80.0 - 100.0 fL   MCH 31.0 26.0 - 34.0 pg   MCHC 33.8 30.0 - 36.0 g/dL   RDW 12.8 11.5 - 15.5 %   Platelets 295 150 - 400 K/uL   nRBC 0.0 0.0 - 0.2 %   Neutrophils Relative % 66 %   Neutro Abs 6.3 1.7 - 7.7 K/uL   Lymphocytes Relative 21 %   Lymphs Abs 2.0 0.7 - 4.0 K/uL   Monocytes Relative 9 %   Monocytes Absolute 0.9 0.1 - 1.0 K/uL   Eosinophils Relative 4 %   Eosinophils Absolute 0.4 0.0 - 0.5 K/uL   Basophils Relative 0 %   Basophils Absolute 0.0 0.0 - 0.1 K/uL   Immature Granulocytes 0 %   Abs Immature Granulocytes 0.03 0.00 - 0.07 K/uL  Basic metabolic panel  Result Value Ref Range   Sodium 140 135 - 145 mmol/L   Potassium 3.8 3.5 - 5.1 mmol/L   Chloride 103 98 - 111 mmol/L   CO2 26 22 - 32 mmol/L   Glucose, Bld 105 (H) 70 - 99 mg/dL   BUN 10 6 - 20 mg/dL   Creatinine, Ser 0.77 0.44 - 1.00 mg/dL   Calcium 8.9 8.9 - 10.3 mg/dL   GFR calc non Af Amer >60 >60 mL/min   GFR calc Af Amer >60 >60 mL/min   Anion gap 11 5 - 15  Brain natriuretic peptide  Result Value Ref Range   B Natriuretic Peptide 32.0 0.0 - 100.0 pg/mL  Troponin I (High Sensitivity)  Result Value Ref Range   Troponin I (High Sensitivity) 4 <18 ng/L      Assessment & Plan:   Problem List Items Addressed This Visit      Respiratory   COPD (chronic obstructive pulmonary disease) ([place])   Relevant Orders   Referral to Chronic Care Management Services     Genitourinary   Bacterial vaginosis - Primary    Acute with wet prep noting clue cells.  UA noting ketones trace, blood trace, bacteria moderate (will send for culture).  Allergic to Flagyl, will send script for Clindamycin vaginal 2% cream.  She is aware to alert provider if any issues.   Return for worsening or ongoing issues.  May benefit from change to  vaginal hormone therapy due to frequent infections.      Relevant Orders   UA/M w/rflx Culture, Routine   WET PREP FOR TRICH, YEAST, CLUE       Follow up plan: Return if symptoms worsen or fail to improve.
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
## 2019-06-30 NOTE — Chronic Care Management (AMB) (Signed)
  Chronic Care Management   Outreach Note  06/30/2019 Name: Lindsey Bautista MRN: [id number] DOB: 1960/09/20  Lindsey Bautista is a 58 y.o. year old female who is a primary care patient of Crissman, Jeannette How, MD. I reached out to [name] by phone today in response to a referral sent by Ms. Iline Oven Gulick's PCP, Marnee Guarneri NP     An unsuccessful telephone outreach was attempted today. The patient was referred to the case management team by for assistance with care management and care coordination.   Follow Up Plan: A HIPPA compliant phone message was left for the patient providing contact information and requesting a return call.  The care management team will reach out to the patient again over the next 7 days.  If patient returns call to provider office, please advise to call Shawnee Glenna Durand LPN at [phone]  Rocky Gladden LPN Nurse Health Advisor . [place]  ??[email or web address] ??[phone]

## 2019-06-30 NOTE — Assessment & Plan Note (Signed)
Acute with wet prep noting clue cells.  UA noting ketones trace, blood trace, bacteria moderate (will send for culture).  Allergic to Flagyl, will send script for Clindamycin vaginal 2% cream.  She is aware to alert provider if any issues.  Return for worsening or ongoing issues.  May benefit from change to vaginal hormone therapy due to frequent infections.
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
## 2019-07-01 NOTE — Telephone Encounter (Signed)
Given her contrast allergy, I do not think it is a great idea to premedicate for the coronary CTA. I would recommend proceeding with the Lexiscan, as there is no cross reaction noted with contrast allergy in this situation. If the Lexiscan is abnormal, then it justify moving forward with LHC, in which she would need to be premedicated given her allergy. I just do not think it is worth it to risk exposing her to a potential life threatening allergic reaction when we have an alternative.

## 2019-07-02 ENCOUNTER — Ambulatory Visit: Payer: Medicare Other

## 2019-07-02 LAB — UA/M W/RFLX CULTURE, ROUTINE
Bilirubin, UA: NEGATIVE
Glucose, UA: NEGATIVE
Leukocytes,UA: NEGATIVE
Nitrite, UA: NEGATIVE
Protein,UA: NEGATIVE
Specific Gravity, UA: 1.025 (ref 1.005–1.030)
Urobilinogen, Ur: 1 mg/dL (ref 0.2–1.0)
pH, UA: 5.5 (ref 5.0–7.5)

## 2019-07-02 LAB — MICROSCOPIC EXAMINATION

## 2019-07-02 LAB — URINE CULTURE, REFLEX: Organism ID, Bacteria: NO GROWTH

## 2019-07-02 NOTE — Telephone Encounter (Signed)
Spoke with the patient and made her aware of Christell Faith, PA recommendation. Patient cancelled her 07/02/19 lexiscan because she thought she was going to have the Cor CTA. Patient is in agreement with Ryan's recommendation to have the Lexiscan vs the Cor CTA due to her allergy of contrast. Patient sts that she was previously given the the pre-test instruction for the [place] and has them on hand. Advised the patient that a scheduler will contact her to re-schedule her myoview.  Patient has no questions or concerns at this time and voiced appreciation for the call back.

## 2019-07-02 NOTE — Telephone Encounter (Signed)
Line busy x 2 .  Will attempt to contact another time

## 2019-07-03 NOTE — Telephone Encounter (Signed)
Left voicemail message to call back and schedule her stress test.

## 2019-07-15 ENCOUNTER — Other Ambulatory Visit: Payer: Self-pay

## 2019-07-15 ENCOUNTER — Encounter
Admission: RE | Admit: 2019-07-15 | Discharge: 2019-07-15 | Disposition: A | Discharge status: Home / Self Care | Payer: Medicare Other | Source: Ambulatory Visit | Attending: Physician Assistant | Admitting: Physician Assistant

## 2019-07-15 ENCOUNTER — Telehealth: Payer: Self-pay

## 2019-07-15 DIAGNOSIS — R0602 Shortness of breath: Secondary | ICD-10-CM | POA: Insufficient documentation

## 2019-07-15 DIAGNOSIS — I25118 Atherosclerotic heart disease of native coronary artery with other forms of angina pectoris: Secondary | ICD-10-CM

## 2019-07-15 LAB — NM MYOCAR MULTI W/SPECT W/WALL MOTION / EF
LV dias vol: 47 mL (ref 46–106)
LV sys vol: 13 mL
Peak HR: 98 {beats}/min
Percent HR: 60 %
Rest HR: 59 {beats}/min
TID: 0.94

## 2019-07-15 MED ORDER — TECHNETIUM TC 99M TETROFOSMIN IV KIT
10.4500 | PACK | Freq: Once | INTRAVENOUS | Status: AC | PRN
  Administered 2019-07-15: 10.45 via INTRAVENOUS

## 2019-07-15 MED ORDER — REGADENOSON 0.4 MG/5ML IV SOLN
0.4000 mg | Freq: Once | INTRAVENOUS | Status: AC
  Administered 2019-07-15: 0.4 mg via INTRAVENOUS

## 2019-07-15 MED ORDER — EZETIMIBE 10 MG PO TABS: 10.0000 mg | ORAL_TABLET | Freq: Every day | ORAL | 3 refills | Status: DC

## 2019-07-15 MED ORDER — TECHNETIUM TC 99M TETROFOSMIN IV KIT
30.0000 | PACK | Freq: Once | INTRAVENOUS | Status: AC | PRN
  Administered 2019-07-15: 29.87 via INTRAVENOUS

## 2019-07-15 NOTE — Telephone Encounter (Signed)
-----   Message from Rise Mu, PA-C sent at 07/15/2019  2:32 PM EST ----- Stress test showed no significant ischemia or scar.  Pump function of the heart is normal.  No significant coronary artery calcification.  Aortic atherosclerosis noted on CT images.  Overall, low risk study.   Last LDL of 116 from 04/2019.  With noted aortic atherosclerosis, please add Zetia 10 mg daily.  Goal LDL < 70.  Recheck fasting lipid and liver function in 8 weeks.

## 2019-07-15 NOTE — Progress Notes (Signed)
Cardiology Office Note  Date:  07/16/2019   ID:  Lindsey Bautista, DOB 29-Oct-1960, MRN [id number]  PCP:  [name], MD   Chief Complaint  Patient presents with  . Other    4 week follow up. Patietn c/o some swelling in legs. Patient denies chest pain and SOB. Meds reviewed verbally with patient.     HPI:  [name] is a 58 year old woman with history of  Medication noncompliance anxiety/depression,  long smoking history for close to 40 years, who continues to smoke stroke type symptoms in April 2015 where she could not talk for 4 days, again in April 2017 diagnosed with conversion disorder again in March 2018 CAD Hyperlipidemia who presents for prior episode of syncope, chest pain.  hospital in 05/2018 with chest pain and ruled out.  diagnostic cath which showed 50% mid LAD stenosis with FFR being normal.  Echo  normal LV systolic function.    placed on Imdur though this was discontinued secondary to headaches.   chronic chest discomfort as well as some dyspnea on exertion anxiety  Stress test yesterday with no ischemia, no ejection fraction  Weight   Stroke type symptoms April 2017 diagnosed with conversion disorder Stress test January 2018 for chest pain with no ischemia  Had  stroke type symptomsMarch 2018 Right-sided weakness, migraine She had chest pain, multiple problems, right-sided weakness, headache Carotid u/s mild disease MRI was negative. Started on for verapamil for migraine.  Not taking lipitor, has several bottles at home as she kept getting refills Not taking verapamil, "scared"  She reports that her right leg is swollen and hurts Worried about a blood clot  Notes indicating shedisability for CVA, memory Issues On previous office visit she was Crying, "stress", "hurts all over" In the past,Arms, legs are hurting, etiology unclear  At baseline she reports having No energy Stays in bed all day Lives with husband  stress test 04/2015,  08/2016  continues to smoke, less than before, 1/2 ppd Trying to quit on her own  EKG on today's visit shows normal sinus rhythm with rate 64 bpm, no significant ST or T-wave changes  Other past medical history reviewed Previously having "passed out spells" sometimes when she is sitting, sometimes when she is standing.  Previous history of excess somnolence, random periods of weakness She is never injured herself when she "passes out" even despite having events when she is standing up.  CT scan of the abdomen from December 2016 Images pulled up in the office, showing moderate diffuse PAD extending into the common iliac arteries  Previous episode of chest pain, had a stress test in 2015 which was reportedly normal.   stress test in 2013 which was normal by report.  Previous Orthostatics done in the office did not show any significant drop in blood pressure Currently not on any cardiac medications .  PMH:   has a past medical history of Atypical chest pain, COPD (chronic obstructive pulmonary disease) (HCC), Coronary artery disease, non-occlusive, Depression, Diastolic dysfunction, GERD (gastroesophageal reflux disease), Hypotension, IBS (irritable bowel syndrome), Right arm weakness, Situational syncope, and TIA (transient ischemic attack).  PSH:    Past Surgical History:  Procedure Laterality Date  . COLONOSCOPY WITH PROPOFOL N/A 11/22/2015   Procedure: COLONOSCOPY WITH PROPOFOL;  Surgeon: Manya Silvas, MD;  Location: [facility] ENDOSCOPY;  Service: Endoscopy;  Laterality: N/A;  . ESOPHAGOGASTRODUODENOSCOPY (EGD) WITH PROPOFOL N/A 11/22/2015   Procedure: ESOPHAGOGASTRODUODENOSCOPY (EGD) WITH PROPOFOL;  Surgeon: Manya Silvas, MD;  Location:  [facility] ENDOSCOPY;  Service: Endoscopy;  Laterality: N/A;  . INTRAVASCULAR PRESSURE WIRE/FFR STUDY N/A 06/19/2018   Procedure: INTRAVASCULAR PRESSURE WIRE/FFR STUDY;  Surgeon: Nelva Bush, MD;  Location: [facility] LAB;  Service:  Cardiovascular;  Laterality: N/A;  . LEFT HEART CATH AND CORONARY ANGIOGRAPHY N/A 06/19/2018   Procedure: LEFT HEART CATH AND CORONARY ANGIOGRAPHY;  Surgeon: Nelva Bush, MD;  Location: [facility] CV LAB;  Service: Cardiovascular;  Laterality: N/A;  . OOPHORECTOMY    . TONSILLECTOMY    . VAGINAL HYSTERECTOMY      Current Outpatient Medications  Medication Sig Dispense Refill  . aspirin 81 MG tablet Take 81 mg by mouth daily.    [name] atorvastatin (LIPITOR) 80 MG tablet Take 1 tablet (80 mg total) by mouth daily at 6 PM. 90 tablet 3  . clonazePAM (KLONOPIN) 1 MG tablet Take 1 tablet (1 mg total) by mouth 2 (two) times daily. 60 tablet 3  . docusate sodium (COLACE) 100 MG capsule Take 100 mg by mouth 2 (two) times daily. Every other day    . EPINEPHrine 0.3 mg/0.3 mL IJ SOAJ injection Inject 0.3 mLs (0.3 mg total) into the muscle as needed for anaphylaxis. 1 each 0  . estradiol (ESTRACE) 1 MG tablet Take 1 tablet (1 mg total) by mouth daily. 90 tablet 1  . ezetimibe (ZETIA) 10 MG tablet Take 1 tablet (10 mg total) by mouth daily. 90 tablet 3  . FLUoxetine (PROZAC) 20 MG capsule Take 1 capsule (20 mg total) by mouth daily. 90 capsule 2  . nitroGLYCERIN (NITROSTAT) 0.4 MG SL tablet Place 1 tablet (0.4 mg total) under the tongue every 5 (five) minutes as needed for chest pain. 50 tablet 3  . polyethylene glycol powder (GLYCOLAX/MIRALAX) 17 GM/SCOOP powder Take 17 g by mouth 2 (two) times daily as needed. 3350 g 1   No current facility-administered medications for this visit.      Allergies:   Flagyl [metronidazole], Penicillin g, Iodinated diagnostic agents, and Flomax [tamsulosin hcl]   Social History:  The patient  reports that she has been smoking cigarettes. She has a 39.00 pack-year smoking history. She has never used smokeless tobacco. She reports that she does not drink alcohol or use drugs.   Family History:   family history includes Bladder Cancer in her mother; Breast cancer  (age of onset: 97) in her paternal aunt; COPD in her father and another family member; Colon cancer in her brother; Heart disease in an other family member; Heart failure in her mother; Renal Disease in her mother; Stroke in an other family member.    Review of Systems: Review of Systems  Constitutional: Positive for malaise/fatigue.  HENT: Negative.   Respiratory: Negative.   Cardiovascular: Negative.   Gastrointestinal: Negative.   Musculoskeletal: Negative.   Neurological: Negative.   Psychiatric/Behavioral: Positive for depression. The patient is nervous/anxious.   All other systems reviewed and are negative.   PHYSICAL EXAM: VS:  BP 128/62 (BP Location: Left Arm, Patient Position: Sitting, Cuff Size: Normal)   Pulse 61   Ht 5\' 1"  (1.549 m)   Wt 147 lb 4 oz (66.8 kg)   BMI 27.82 kg/m  , BMI Body mass index is 27.82 kg/m. GEN: Well nourished, well developed, in no acute distress  HEENT: normal  Neck: no JVD, carotid bruits, or masses Cardiac: RRR; no murmurs, rubs, or gallops,no edema  Respiratory:  clear to auscultation bilaterally, normal work of breathing GI: soft, nontender, nondistended, + BS MS:  no deformity or atrophy  Skin: warm and dry, no rash Neuro:  Strength and sensation are intact Psych: full affect, tearful in the exam room today, better at the end of the visit    Recent Labs: 05/14/2019: ALT 7; TSH 1.100 06/06/2019: B Natriuretic Peptide 32.0; BUN 10; Creatinine, Ser 0.77; Hemoglobin 14.0; Platelets 295; Potassium 3.8; Sodium 140    Lipid Panel Lab Results  Component Value Date   CHOL 191 05/14/2019   HDL 47 05/14/2019   LDLCALC 116 (H) 05/14/2019   TRIG 157 (H) 05/14/2019      Wt Readings from Last 3 Encounters:  07/16/19 147 lb 4 oz (66.8 kg)  06/30/19 144 lb (65.3 kg)  06/17/19 144 lb 12 oz (65.7 kg)       ASSESSMENT AND PLAN:  PAD (peripheral artery disease) (HCC) Moderate disease seen on CT scan Recommended she restart her  cholesterol medication Scheduled forLE arterial doppler  Situational syncope No episodes of syncope  Abdominal aortic atherosclerosis (HCC) Smoking cessation, aggressive lipid management Recommended she restart Lipitor. Add zetia  Mixed hyperlipidemia - Plan: EKG 12-Lead lipitor and zetia  Smoker - Plan: EKG 12-Lead We have encouraged her to continue to work on weaning her cigarettes and smoking cessation. She will continue to work on this and does not want any assistance with chantix.   Centrilobular emphysema (HCC) Continues to smoke, chronic mild shortness of breath on exertion Smoking cessation recommended  Chest pressure - Plan: EKG 12-Lead Stress test January 2018 with no ischemia Stress test 2020 no ischemia Atypical symptoms  Depression/anxiety Managed by primary care     Total encounter time more than 25 minutes  Greater than 50% was spent in counseling and coordination of care with the patient   Disposition:   F/U  12 months   No orders of the defined types were placed in this encounter.    Signed, Esmond Plants, M.D., Ph.D. 07/16/2019  [place], [place]

## 2019-07-15 NOTE — Telephone Encounter (Signed)
Call to patient to discuss lab results and POC. Pt verbalized understanding and in agreement with POC>   Lab orders placed. And Rx sent to preferred pharmacy on file.   Pt has f/u with Dr. Rockey Situ tomorrow.

## 2019-07-16 ENCOUNTER — Ambulatory Visit (INDEPENDENT_AMBULATORY_CARE_PROVIDER_SITE_OTHER): Payer: Medicare Other | Admitting: Cardiovascular Disease

## 2019-07-16 VITALS — BP 128/62 | HR 61 | Ht 61.0 in | Wt 147.2 lb

## 2019-07-16 DIAGNOSIS — I25118 Atherosclerotic heart disease of native coronary artery with other forms of angina pectoris: Secondary | ICD-10-CM

## 2019-07-16 DIAGNOSIS — I700 Atherosclerosis of aorta: Secondary | ICD-10-CM | POA: Diagnosis not present

## 2019-07-16 DIAGNOSIS — E785 Hyperlipidemia, unspecified: Secondary | ICD-10-CM | POA: Diagnosis not present

## 2019-07-16 DIAGNOSIS — R0602 Shortness of breath: Secondary | ICD-10-CM | POA: Diagnosis not present

## 2019-07-16 DIAGNOSIS — R079 Chest pain, unspecified: Secondary | ICD-10-CM

## 2019-07-16 DIAGNOSIS — I2510 Atherosclerotic heart disease of native coronary artery without angina pectoris: Secondary | ICD-10-CM

## 2019-07-16 NOTE — Patient Instructions (Addendum)
Work on the smoking   Medication Instructions:  Please add zetia 10 mg daily with atorvastatin one a day Already called in  If you need a refill on your cardiac medications before your next appointment, please call your pharmacy.    Lab work: No new labs needed   If you have labs (blood work) drawn today and your tests are completely normal, you will receive your results only by: [name] MyChart Message (if you have MyChart) OR . A paper copy in the mail If you have any lab test that is abnormal or we need to change your treatment, we will call you to review the results.   Testing/Procedures: No new testing needed   Follow-Up: At [facility], you and your health needs are our priority.  As part of our continuing mission to provide you with exceptional heart care, we have created designated Provider Care Teams.  These Care Teams include your primary Cardiologist (physician) and Advanced Practice Providers (APPs -  Physician Assistants and Nurse Practitioners) who all work together to provide you with the care you need, when you need it.  . You will need a follow up appointment in 12 months .   Please call our office 2 months in advance to schedule this appointment.    . Providers on your designated Care Team:   . Murray Hodgkins, NP . Christell Faith, PA-C . Marrianne Mood, PA-C  Any Other Special Instructions Will Be Listed Below (If Applicable).  For educational health videos Log in to : [email or web address] Or : [email or web address], password : [email or web address]

## 2019-08-01 ENCOUNTER — Encounter: Payer: Self-pay | Admitting: Nurse Practitioner

## 2019-08-01 ENCOUNTER — Ambulatory Visit (INDEPENDENT_AMBULATORY_CARE_PROVIDER_SITE_OTHER): Payer: Medicare Other | Admitting: Nurse Practitioner

## 2019-08-01 ENCOUNTER — Other Ambulatory Visit: Payer: Self-pay

## 2019-08-01 VITALS — BP 123/71 | HR 66 | Temp 98.1°F

## 2019-08-01 DIAGNOSIS — N952 Postmenopausal atrophic vaginitis: Secondary | ICD-10-CM | POA: Insufficient documentation

## 2019-08-01 DIAGNOSIS — I2510 Atherosclerotic heart disease of native coronary artery without angina pectoris: Secondary | ICD-10-CM

## 2019-08-01 DIAGNOSIS — R300 Dysuria: Secondary | ICD-10-CM | POA: Diagnosis not present

## 2019-08-01 LAB — WET PREP FOR TRICH, YEAST, CLUE
Clue Cell Exam: NEGATIVE
Trichomonas Exam: NEGATIVE
Yeast Exam: NEGATIVE

## 2019-08-01 LAB — UA/M W/RFLX CULTURE, ROUTINE
Bilirubin, UA: NEGATIVE
Glucose, UA: NEGATIVE
Ketones, UA: NEGATIVE
Leukocytes,UA: NEGATIVE
Nitrite, UA: NEGATIVE
Protein,UA: NEGATIVE
RBC, UA: NEGATIVE
Specific Gravity, UA: 1.015 (ref 1.005–1.030)
Urobilinogen, Ur: 1 mg/dL (ref 0.2–1.0)
pH, UA: 6 (ref 5.0–7.5)

## 2019-08-01 MED ORDER — ESTRADIOL 0.1 MG/GM VA CREA: TOPICAL_CREAM | VAGINAL | 5 refills | Status: DC

## 2019-08-01 NOTE — Assessment & Plan Note (Signed)
UA and wet prep negative today.  Suspect atrophic vaginitis based on exam findings and recent frequent vaginal infections.  Script for vaginal estrace sent, no contraindications reported by patient on discussion.  Return in 4 weeks for follow-up.

## 2019-08-01 NOTE — Patient Instructions (Signed)
Atrophic Vaginitis Atrophic vaginitis is a condition in which the tissues that line the vagina become dry and thin. This condition occurs in women who have stopped having their period. It is caused by a drop in a female hormone (estrogen). This hormone helps:  To keep the vagina moist.  To make a clear fluid. This clear fluid helps: ? To make the vagina ready for sex. ? To protect the vagina from infection. If the lining of the vagina is dry and thin, it may cause irritation, burning, or itchiness. It may also:  Make sex painful.  Make an exam of your vagina painful.  Cause bleeding.  Make you lose interest in sex.  Cause a burning feeling when you pee (urinate).  Cause a brown or yellow fluid to come from your vagina. Some women do not have symptoms. Follow these instructions at home: Medicines  Take over-the-counter and prescription medicines only as told by your doctor.  Do not use herbs or other medicines unless your doctor says it is okay.  Use medicines for for dryness. These include: ? Oils to make the vagina soft. ? Creams. ? Moisturizers. General instructions  Do not douche.  Do not use products that can make your vagina dry. These include: ? Scented sprays. ? Scented tampons. ? Scented soaps.  Sex can help increase blood flow and soften the tissue in the vagina. If it hurts to have sex: ? Tell your partner. ? Use products to make sex more comfortable. Use these only as told by your doctor. Contact a doctor if you:  Have discharge from the vagina that is different than usual.  Have a bad smell coming from your vagina.  Have new symptoms.  Do not get better.  Get worse. Summary  Atrophic vaginitis is a condition in which the lining of the vagina becomes dry and thin.  This condition affects women who have stopped having their periods.  Treatment may include using products that help make the vagina soft.  Call a doctor if do not get better with  treatment. This information is not intended to replace advice given to you by your health care provider. Make sure you discuss any questions you have with your health care provider. Document Released: 01/24/2008 Document Revised: 08/20/2017 Document Reviewed: 08/20/2017 Elsevier Patient Education  2020 [employer]  

## 2019-08-01 NOTE — Progress Notes (Signed)
BP 123/71   Pulse 66   Temp 98.1 F (36.7 C) (Oral)   SpO2 97%    Subjective:    Patient ID: Lindsey Bautista, female    DOB: 04-15-1961, 58 y.o.   MRN: [id number]  HPI: [name] is a 59 y.o. female  Chief Complaint  Patient presents with  . Vaginal Pain    pt states she has had an on going UTI/BV for the past 3 months, states it will clear up with abx but then come right back. States she feels like her vagina is swollen and would like Rhilynn Preyer to take a look at it    [place] to report discharge, with abx will go away for a week or so.  Last treated 06/30/19, treated with Clindamycin gel.  Reports that the applicator did not fit in well and it felt tight.  Reports she has had some abdominal discomfort recently with her vaginal discharge, but also endorses underlying constipation she takes Miralax and Colace for.  Has BM about every 3 days.  Last colonoscopy in 2017.  Denies melena, rectal bleeding, diarrhea, or N&V.    Is concerned with bladder cancer due to her mother (she passed from this) and mother's sister having bladder cancer. Denies urinary symptoms. States her vagina feels hot and swollen.  Had a hysterectomy in 1992 took uterus, 1993 took right ovary, 1994 took left ovary.  She is unsure if cervix is still present.  No history of breast CA, endometrial CA, or ovarian CA.  Does endorse vaginal dryness and dyspareunia.  Has not been sexually active with her husband in several years. Duration: days Discharge description: white  Pruritus: no Dysuria: no Malodorous: yes Urinary frequency: no Fevers: no Abdominal pain: occasional  Sexual activity: not sexually active History of sexually transmitted diseases: no Recent antibiotic use: yes Context: recurrent BV  Treatments attempted: none  Relevant past medical, surgical, family and social history reviewed and updated as indicated. Interim medical history since our last visit reviewed. Allergies and  medications reviewed and updated.  Review of Systems  Constitutional: Negative for activity change, appetite change, diaphoresis, fatigue and fever.  Respiratory: Negative for cough, chest tightness and shortness of breath.   Cardiovascular: Negative for chest pain, palpitations and leg swelling.  Gastrointestinal: Negative for abdominal distention, abdominal pain, constipation, diarrhea, nausea and vomiting.  Genitourinary: Positive for vaginal discharge. Negative for dysuria, frequency, hematuria, pelvic pain, urgency, vaginal bleeding and vaginal pain.  Psychiatric/Behavioral: Negative.     Per HPI unless specifically indicated above     Objective:    BP 123/71   Pulse 66   Temp 98.1 F (36.7 C) (Oral)   SpO2 97%   Wt Readings from Last 3 Encounters:  07/16/19 147 lb 4 oz (66.8 kg)  06/30/19 144 lb (65.3 kg)  06/17/19 144 lb 12 oz (65.7 kg)    Physical Exam Vitals and nursing note reviewed.  Constitutional:      General: She is awake. She is not in acute distress.    Appearance: She is well-developed. She is not ill-appearing.  HENT:     Head: Normocephalic.     Right Ear: Hearing normal.     Left Ear: Hearing normal.  Eyes:     General: Lids are normal.        Right eye: No discharge.        Left eye: No discharge.     Conjunctiva/sclera: Conjunctivae normal.     Pupils:  Pupils are equal, round, and reactive to light.  Cardiovascular:     Rate and Rhythm: Normal rate and regular rhythm.     Heart sounds: Normal heart sounds. No murmur. No gallop.   Pulmonary:     Effort: Pulmonary effort is normal. No accessory muscle usage or respiratory distress.     Breath sounds: Normal breath sounds.  Abdominal:     General: Bowel sounds are normal. There is no distension.     Palpations: Abdomen is soft. There is no hepatomegaly or splenomegaly.     Tenderness: There is no abdominal tenderness. There is no right CVA tenderness or left CVA tenderness.  Genitourinary:     Exam position: Lithotomy position.     Labia:        Right: No rash.        Left: No rash.      Vagina: Erythema present. No vaginal discharge.     Comments: No cervix viewed, suspect not present. Vulvovaginal erythema noted and loss of rugae + decreased secretions.   Musculoskeletal:     Cervical back: Normal range of motion and neck supple.     Right lower leg: No edema.     Left lower leg: No edema.  Skin:    General: Skin is warm and dry.  Neurological:     Mental Status: She is alert and oriented to person, place, and time.  Psychiatric:        Attention and Perception: Attention normal.        Mood and Affect: Mood normal.        Behavior: Behavior normal. Behavior is cooperative.        Thought Content: Thought content normal.        Judgment: Judgment normal.     Results for orders placed or performed in visit on 08/01/19  WET PREP FOR [place], YEAST, CLUE   Specimen: Vaginal Fluid   VAGINAL FLUI  Result Value Ref Range   Trichomonas Exam Negative Negative   Yeast Exam Negative Negative   Clue Cell Exam Negative Negative  UA/M w/rflx Culture, Routine   Specimen: Vaginal Fluid   VAGINAL FLUI  Result Value Ref Range   Specific Gravity, UA 1.015 1.005 - 1.030   pH, UA 6.0 5.0 - 7.5   Color, UA Yellow Yellow   Appearance Ur Clear Clear   Leukocytes,UA Negative Negative   Protein,UA Negative Negative/Trace   Glucose, UA Negative Negative   Ketones, UA Negative Negative   RBC, UA Negative Negative   Bilirubin, UA Negative Negative   Urobilinogen, Ur 1.0 0.2 - 1.0 mg/dL   Nitrite, UA Negative Negative      Assessment & Plan:   Problem List Items Addressed This Visit      Genitourinary   Atrophic vaginitis - Primary    UA and wet prep negative today.  Suspect atrophic vaginitis based on exam findings and recent frequent vaginal infections.  Script for vaginal estrace sent, no contraindications reported by patient on discussion.  Return in 4 weeks for follow-up.        Relevant Orders   WET PREP FOR [place], YEAST, [place] (Completed)   UA/M w/rflx Culture, Routine (Completed)       Follow up plan: Return in about 4 weeks (around 08/29/2019) for Follow-up chronic illnesses.

## 2019-08-12 ENCOUNTER — Ambulatory Visit: Payer: Medicare Other

## 2019-08-12 NOTE — Chronic Care Management (AMB) (Signed)
  Chronic Care Management   Note  08/12/2019 Name: [name] MRN: [id number] DOB: 1961-02-21  Lindsey Bautista is a 58 y.o. year old female who is a primary care patient of Crissman, Jeannette How, MD. The CCM team was consulted for assistance with chronic disease management and care coordination needs.    Contacted patient today as scheduled for medication management review, however, she had forgotten about our appointment and was in the middle of cooking.   Reiterated the purpose of CCM team, and patient remains interested. Reminded her of her upcoming appointment with Rutherford Limerick, RN CM next week, and rescheduled our pharmacy phone call for 09/12/2019 at 2 pm.   Courtney Heys, PharmD, [place] [phone]

## 2019-08-19 ENCOUNTER — Ambulatory Visit: Payer: Self-pay | Admitting: *Deleted

## 2019-08-19 ENCOUNTER — Telehealth: Payer: Medicare Other

## 2019-08-19 NOTE — Chronic Care Management (AMB) (Signed)
  Chronic Care Management   Outreach Note  08/19/2019 Name: Lindsey Bautista MRN: [id number] DOB: 1961/08/04  Referred by: [name], MD Reason for referral : Chronic Care Management (Unsuccessful Outreach )   An unsuccessful telephone outreach was attempted today. The patient was referred to the case management team by for assistance with care management and care coordination.   Follow Up Plan: A HIPPA compliant phone message was left for the patient providing contact information and requesting a return call.  The care management team will reach out to the patient again over the next 60  days.   Merlene Morse Kylor Valverde RN, BSN Nurse Case Editor, [facility]  [phone]) Business Mobile

## 2019-08-20 ENCOUNTER — Telehealth: Payer: Self-pay | Admitting: Family Medicine

## 2019-08-20 NOTE — Chronic Care Management (AMB) (Signed)
°  Care Management   Note  08/20/2019 Name: TONASIA DELAIR MRN: [id number] DOB: September 29, 1960  CASS NOBLE is a 58 y.o. year old female who is a primary care patient of [name], MD and is actively engaged with the care management team. I reached out to [name] by phone today to assist with re-scheduling a follow up appointment with the RN Case Manager  Follow up plan: The care management team will reach out to the patient again over the next 7 days.   [place], [place] [address] Direct Dial: [email or web address]  Website: [email or web address]

## 2019-08-25 ENCOUNTER — Telehealth: Payer: Self-pay | Admitting: Family Medicine

## 2019-08-25 NOTE — Chronic Care Management (AMB) (Signed)
  Care Management   Note  08/25/2019 Name: [name] MRN: [id number] DOB: [birth year]  Lindsey Bautista is a 59 y.o. year old female who is a primary care patient of [name], MD and is actively engaged with the care management team. I reached out to [name] by phone today to assist with re-scheduling a follow up appointment with the RN Case Manager  Follow up plan: Patient declines further follow up and engagement by the care management team. Appropriate care team members and provider have been notified via electronic communication. , The care management team is available to follow up with the patient after provider conversation with the patient regarding recommendation for care management engagement and subsequent re-referral to the care management team.  and The patient has been provided with contact information for the care management team and has been advised to call with any health related questions or concerns.   [place], [place] [address] Direct Dial: [email or web address]  Website: [email or web address]

## 2019-08-25 NOTE — Chronic Care Management (AMB) (Signed)
  Care Management   Note  08/25/2019 Name: Lindsey Bautista MRN: [id number] DOB: [birth year]  Lindsey Bautista is a 59 y.o. year old female who is a primary care patient of [name], MD and is actively engaged with the care management team. I reached out to [name] by phone today to assist with re-scheduling a follow up appointment with the RN Case Manager  Follow up plan: Patient declines further follow up and engagement by the care management team. Appropriate care team members and provider have been notified via electronic communication. , The care management team is available to follow up with the patient after provider conversation with the patient regarding recommendation for care management engagement and subsequent re-referral to the care management team.  and The patient has been provided with contact information for the care management team and has been advised to call with any health related questions or concerns.   [place], [place] [address] Direct Dial: [email or web address]  Website: [email or web address]

## 2019-09-12 ENCOUNTER — Telehealth: Payer: Medicare Other

## 2019-09-15 ENCOUNTER — Other Ambulatory Visit: Payer: Self-pay

## 2019-09-15 ENCOUNTER — Ambulatory Visit (INDEPENDENT_AMBULATORY_CARE_PROVIDER_SITE_OTHER): Payer: Medicare Other | Admitting: Nurse Practitioner

## 2019-09-15 ENCOUNTER — Encounter: Payer: Self-pay | Admitting: Nurse Practitioner

## 2019-09-15 VITALS — BP 122/68 | HR 60 | Temp 97.6°F | Wt 145.4 lb

## 2019-09-15 DIAGNOSIS — D3501 Benign neoplasm of right adrenal gland: Secondary | ICD-10-CM | POA: Insufficient documentation

## 2019-09-15 DIAGNOSIS — N760 Acute vaginitis: Secondary | ICD-10-CM | POA: Diagnosis not present

## 2019-09-15 DIAGNOSIS — R1084 Generalized abdominal pain: Secondary | ICD-10-CM

## 2019-09-15 DIAGNOSIS — B9689 Other specified bacterial agents as the cause of diseases classified elsewhere: Secondary | ICD-10-CM | POA: Diagnosis not present

## 2019-09-15 LAB — WET PREP FOR TRICH, YEAST, CLUE
Clue Cell Exam: POSITIVE — AB
Trichomonas Exam: NEGATIVE
Yeast Exam: NEGATIVE

## 2019-09-15 LAB — UA/M W/RFLX CULTURE, ROUTINE
Bilirubin, UA: NEGATIVE
Glucose, UA: NEGATIVE
Ketones, UA: NEGATIVE
Leukocytes,UA: NEGATIVE
Nitrite, UA: NEGATIVE
Protein,UA: NEGATIVE
RBC, UA: NEGATIVE
Specific Gravity, UA: 1.01 (ref 1.005–1.030)
Urobilinogen, Ur: 0.2 mg/dL (ref 0.2–1.0)
pH, UA: 6.5 (ref 5.0–7.5)

## 2019-09-15 MED ORDER — CLINDAMYCIN PHOSPHATE 2 % VA CREA: 1.0000 | TOPICAL_CREAM | Freq: Every day | VAGINAL | 0 refills | Status: DC

## 2019-09-15 MED ORDER — LINACLOTIDE 72 MCG PO CAPS: 72.0000 ug | ORAL_CAPSULE | Freq: Every day | ORAL | 0 refills | Status: DC

## 2019-09-15 NOTE — Patient Instructions (Signed)

## 2019-09-15 NOTE — Assessment & Plan Note (Signed)
Acute with wet prep noting clue cells.  UA negative.  Allergic to Flagyl, will send script for Clindamycin vaginal 2% cream which was beneficial last time.  She is aware to alert provider if any issues.  Return for worsening or ongoing issues.  Continue vaginal estrace for atrophy.

## 2019-09-15 NOTE — Assessment & Plan Note (Signed)
Acute over past few weeks, has had similar complaints in past.  Suspect related to ongoing issues with constipation and current BV infection. UA negative today, including no blood.  Will send script for Linzess to trial, since OTC medications offering minimal benefit, plus treat BV.  Obtain CT abdomen pelvis to reassess right adrenal adenoma and due to patient concern family history bladder CA, have discussed at length with her risk factors for this, she wishes imaging to ease her mind.  Recommend continued use of vaginal estrace + focus on healthy fiber diet and complete cessation of smoking.  Return in 4 weeks or sooner if worsening.

## 2019-09-15 NOTE — Progress Notes (Signed)
BP 122/68 (BP Location: Left Arm, Patient Position: Sitting, Cuff Size: Normal)   Pulse 60   Temp 97.6 F (36.4 C) (Oral)   Wt 145 lb 6.4 oz (66 kg)   SpO2 98%   BMI 27.47 kg/m    Subjective:    Patient ID: Lindsey Bautista, female    DOB: 1961-07-27, 59 y.o.   MRN: [id number]  HPI: Lindsey Bautista is a 59 y.o. female  Chief Complaint  Patient presents with  . Abdominal Pain    3 weeks  . Nausea    constant. can not eat.    ABDOMINAL PAIN  Reports this has been ongoing for 3 weeks.  Has a history of frequent UTI and BV infections.  Reports she stays nauseous every day, from time her eyes open until she goes to sleep.  Her appetite started becoming poor about 4 weeks ago.  Reports when she bends over "you can see my stomach roll, like a knot, and it hurts".    Is concerned with bladder cancer due to her mother (she passed from this) and mother's sister having bladder cancer. Denies urinary symptoms, including no hematuria. Is a current smoker.  Had a hysterectomy in 1992 took uterus, 1993 took right ovary, 1994 took left ovary.  She is unsure if cervix is still present.  No history of breast CA, endometrial CA, or ovarian CA.  Does endorse vaginal dryness and dyspareunia, was started on vaginal Estrace last visit for this due to significant atrophy.  Has not been sexually active with her husband in several years. In July 2020 a renal study CT was done and noted moderate stool burden.  She reports her bowel pattern varies.  Last three days had had BM daily.  Takes Miralax and Colace, takes Colace every day.  Miralax takes as needed.  Does strain at times when passing bowels.  Last CT abdomen and pelvis was in July 2019 with right adrenal adenoma noted that had slight increase in size, 1.8 x 1.3 cm and mild prominent stool throughout colon.  Recent TSH in September 2020 = 1.100. Duration:weeks Onset: gradual Severity: 6/10 at worst, will wake her up sometimes Quality: sticking sensation,  dull and aching Location:  right middle abdomen and lower and RLQ  Episode duration:  Radiation: down into groin area Frequency: constant Alleviating factors:  Aggravating factors: Status: fluctuating Treatments attempted: Tylenol and Ibuprofen (alternates) Fever: no Nausea: yes Vomiting: no Weight loss: no Decreased appetite: yes Diarrhea: no Constipation: yes Blood in stool: no Heartburn: no Jaundice: no Rash: no Dysuria/urinary frequency: no Hematuria: no History of sexually transmitted disease: no Recurrent NSAID use: no  Relevant past medical, surgical, family and social history reviewed and updated as indicated. Interim medical history since our last visit reviewed. Allergies and medications reviewed and updated.  Review of Systems  Constitutional: Positive for appetite change. Negative for activity change, diaphoresis, fatigue and fever.  Respiratory: Negative for cough, chest tightness, shortness of breath and wheezing.   Cardiovascular: Negative for chest pain, palpitations and leg swelling.  Gastrointestinal: Positive for abdominal pain, constipation and nausea. Negative for abdominal distention, blood in stool, diarrhea and vomiting.  Endocrine: Negative for cold intolerance and heat intolerance.  Neurological: Negative for dizziness, syncope, weakness, light-headedness, numbness and headaches.  Psychiatric/Behavioral: Negative.     Per HPI unless specifically indicated above     Objective:    BP 122/68 (BP Location: Left Arm, Patient Position: Sitting, Cuff Size: Normal)   Pulse 60  Temp 97.6 F (36.4 C) (Oral)   Wt 145 lb 6.4 oz (66 kg)   SpO2 98%   BMI 27.47 kg/m   Wt Readings from Last 3 Encounters:  09/15/19 145 lb 6.4 oz (66 kg)  07/16/19 147 lb 4 oz (66.8 kg)  06/30/19 144 lb (65.3 kg)    Physical Exam Vitals and nursing note reviewed.  Constitutional:      General: She is awake. She is not in acute distress.    Appearance: She is  well-developed, well-groomed and overweight. She is not ill-appearing.  HENT:     Head: Normocephalic.     Right Ear: Hearing normal.     Left Ear: Hearing normal.  Eyes:     General: Lids are normal.        Right eye: No discharge.        Left eye: No discharge.     Conjunctiva/sclera: Conjunctivae normal.     Pupils: Pupils are equal, round, and reactive to light.  Neck:     Thyroid: No thyromegaly.     Vascular: No carotid bruit.  Cardiovascular:     Rate and Rhythm: Normal rate and regular rhythm.     Heart sounds: Normal heart sounds. No murmur. No gallop.   Pulmonary:     Effort: Pulmonary effort is normal. No accessory muscle usage or respiratory distress.     Breath sounds: Normal breath sounds.  Abdominal:     General: Bowel sounds are normal. There is no distension or abdominal bruit.     Palpations: Abdomen is soft. There is no hepatomegaly or splenomegaly.     Tenderness: There is generalized abdominal tenderness. There is no right CVA tenderness, left CVA tenderness, guarding or rebound. Negative signs include Murphy's sign, McBurney's sign and psoas sign.     Hernia: No hernia is present.     Comments: Diffused tenderness on palpation to RUQ, LUQ, RLQ, and suprapubic.  Active BS throughout, normal.    Musculoskeletal:     Cervical back: Normal range of motion and neck supple.     Right lower leg: No edema.     Left lower leg: No edema.  Skin:    General: Skin is warm and dry.  Neurological:     Mental Status: She is alert and oriented to person, place, and time.  Psychiatric:        Attention and Perception: Attention normal.        Mood and Affect: Mood normal.        Behavior: Behavior normal. Behavior is cooperative.        Thought Content: Thought content normal.        Judgment: Judgment normal.     Results for orders placed or performed in visit on 09/15/19  WET PREP FOR [place], YEAST, CLUE   Specimen: Vaginal Fluid   VAGINAL FLUI  Result Value Ref  Range   Trichomonas Exam Negative Negative   Yeast Exam Negative Negative   Clue Cell Exam Positive (A) Negative  UA/M w/rflx Culture, Routine   Specimen: Vaginal Fluid   VAGINAL FLUI  Result Value Ref Range   Specific Gravity, UA 1.010 1.005 - 1.030   pH, UA 6.5 5.0 - 7.5   Color, UA Yellow Yellow   Appearance Ur Clear Clear   Leukocytes,UA Negative Negative   Protein,UA Negative Negative/Trace   Glucose, UA Negative Negative   Ketones, UA Negative Negative   RBC, UA Negative Negative   Bilirubin, UA Negative Negative  Urobilinogen, Ur 0.2 0.2 - 1.0 mg/dL   Nitrite, UA Negative Negative      Assessment & Plan:   Problem List Items Addressed This Visit      Endocrine   Adrenal adenoma, right    Repeat abdominal/pelvic CT to reassess area due to patient concerns and current symptoms.  Previous scan in 2019.      Relevant Orders   CT Abdomen Pelvis Wo Contrast     Genitourinary   Bacterial vaginosis    Acute with wet prep noting clue cells.  UA negative.  Allergic to Flagyl, will send script for Clindamycin vaginal 2% cream which was beneficial last time.  She is aware to alert provider if any issues.  Return for worsening or ongoing issues.  Continue vaginal estrace for atrophy.        Other   Generalized abdominal pain - Primary    Acute over past few weeks, has had similar complaints in past.  Suspect related to ongoing issues with constipation and current BV infection. UA negative today, including no blood.  Will send script for Linzess to trial, since OTC medications offering minimal benefit, plus treat BV.  Obtain CT abdomen pelvis to reassess right adrenal adenoma and due to patient concern family history bladder CA, have discussed at length with her risk factors for this, she wishes imaging to ease her mind.  Recommend continued use of vaginal estrace + focus on healthy fiber diet and complete cessation of smoking.  Return in 4 weeks or sooner if worsening.       Relevant Orders   UA/M w/rflx Culture, Routine (Completed)   WET PREP FOR [place], YEAST, CLUE (Completed)   CT Abdomen Pelvis Wo Contrast      Time: 25 minutes, >50% spent counseling patient on bladder cancer and discussing at length plan of care   Follow up plan: Return in about 4 weeks (around 10/13/2019) for abdominal pain.

## 2019-09-15 NOTE — Assessment & Plan Note (Addendum)
Repeat abdominal/pelvic CT to reassess area due to patient concerns and current symptoms.  Previous scan in 2019.

## 2019-09-26 ENCOUNTER — Other Ambulatory Visit: Payer: Self-pay

## 2019-09-26 ENCOUNTER — Ambulatory Visit
Admission: RE | Admit: 2019-09-26 | Discharge: 2019-09-26 | Disposition: A | Discharge status: Home / Self Care | Payer: Medicare Other | Source: Ambulatory Visit | Attending: Nurse Practitioner | Admitting: Nurse Practitioner

## 2019-09-26 DIAGNOSIS — D3501 Benign neoplasm of right adrenal gland: Secondary | ICD-10-CM | POA: Diagnosis not present

## 2019-09-26 DIAGNOSIS — R1084 Generalized abdominal pain: Secondary | ICD-10-CM | POA: Diagnosis not present

## 2019-09-28 NOTE — Progress Notes (Signed)
Please let Lindsey Bautista know her CT returned and there is no abdominal or pelvic, bladder issues present on CT.  The small area seen on the gland above her right kidney on previous imaging remains the same with no change in size, it remains similar to her prior abdominal CT.  This is good, meaning more non cancerous in appearance. Bladder was reported as unremarkable, no findings.  If ongoing discomfort then please return to office for further evaluation.

## 2019-09-29 ENCOUNTER — Telehealth: Payer: Self-pay | Admitting: Nurse Practitioner

## 2019-09-29 NOTE — Telephone Encounter (Signed)
Patient was notified of results, see result note.

## 2019-09-29 NOTE — Telephone Encounter (Signed)
Copied from [place] [id number]. Topic: Quick Communication - Other Results (Clinic Use ONLY) >> Sep 29, 2019  8:22 AM Scherrie Gerlach wrote: Pt would like a call back with her CT results

## 2019-10-14 ENCOUNTER — Other Ambulatory Visit: Payer: Self-pay | Admitting: Nurse Practitioner

## 2019-10-14 DIAGNOSIS — F419 Anxiety disorder, unspecified: Secondary | ICD-10-CM

## 2019-10-14 MED ORDER — CLONAZEPAM 1 MG PO TABS: 1.0000 mg | ORAL_TABLET | Freq: Two times a day (BID) | ORAL | 0 refills | Status: DC

## 2019-10-14 NOTE — Telephone Encounter (Signed)
Routing to provider  

## 2019-10-14 NOTE — Telephone Encounter (Signed)
Requested medication (s) are due for refill today: yes  Requested medication (s) are on the active medication list: yes  Last refill:  09/13/19  Future visit scheduled: no  Notes to clinic:  not delegated    Requested Prescriptions  Pending Prescriptions Disp Refills   clonazePAM (KLONOPIN) 1 MG tablet [Pharmacy Med Name: clonazePAM 1 MG Oral Tablet] 60 tablet 0    Sig: Take 1 tablet by mouth twice daily      Not Delegated - Psychiatry:  Anxiolytics/Hypnotics Failed - 10/14/2019 11:46 AM      Failed - This refill cannot be delegated      Failed - Urine Drug Screen completed in last 360 days.      Passed - Valid encounter within last 6 months    Recent Outpatient Visits           4 weeks ago Generalized abdominal pain   [place], [place] T, NP   2 months ago Atrophic vaginitis   [place], [place] T, NP   3 months ago Bacterial vaginosis   [place], [place] T, NP   5 months ago Depression, recurrent ([place])   [name], Barbaraann Faster, NP   5 months ago Bad odor of urine   [employer], [place] T, NP               Signed Prescriptions Disp Refills   LINZESS 72 MCG capsule 30 capsule 0    Sig: TAKE 1 CAPSULE BY MOUTH ONCE DAILY BEFORE BREAKFAST      Gastroenterology: Irritable Bowel Syndrome Passed - 10/14/2019 11:46 AM      Passed - Valid encounter within last 12 months    Recent Outpatient Visits           4 weeks ago Generalized abdominal pain   [place], Henrine [place] T, NP   2 months ago Atrophic vaginitis   [place], [place] T, NP   3 months ago Bacterial vaginosis   [place], [place] T, NP   5 months ago Depression, recurrent ([place])   [place], Barbaraann Faster, NP   5 months ago Bad odor of urine   [place], Barbaraann Faster, NP

## 2019-10-14 NOTE — Telephone Encounter (Signed)
Requested Prescriptions  Pending Prescriptions Disp Refills  . LINZESS 72 MCG capsule [Pharmacy Med Name: Linzess 72 MCG Oral Capsule] 30 capsule 0    Sig: TAKE 1 CAPSULE BY MOUTH ONCE DAILY BEFORE BREAKFAST     Gastroenterology: Irritable Bowel Syndrome Passed - 10/14/2019 11:46 AM      Passed - Valid encounter within last 12 months    Recent Outpatient Visits          4 weeks ago Generalized abdominal pain   [place], [place] T, NP   2 months ago Atrophic vaginitis   [place], [place] T, NP   3 months ago Bacterial vaginosis   [place], [place] T, NP   5 months ago Depression, recurrent ([place])   [place], Barbaraann Faster, NP   5 months ago Bad odor of urine   [employer], [place] T, NP             . clonazePAM (KLONOPIN) 1 MG tablet [Pharmacy Med Name: clonazePAM 1 MG Oral Tablet] 60 tablet 0    Sig: Take 1 tablet by mouth twice daily     Not Delegated - Psychiatry:  Anxiolytics/Hypnotics Failed - 10/14/2019 11:46 AM      Failed - This refill cannot be delegated      Failed - Urine Drug Screen completed in last 360 days.      Passed - Valid encounter within last 6 months    Recent Outpatient Visits          4 weeks ago Generalized abdominal pain   [place], [place] T, NP   2 months ago Atrophic vaginitis   [place], [place] T, NP   3 months ago Bacterial vaginosis   [place], [place] T, NP   5 months ago Depression, recurrent ([place])   [place], Barbaraann Faster, NP   5 months ago Bad odor of urine   [place], Barbaraann Faster, NP

## 2019-10-31 ENCOUNTER — Encounter: Payer: Self-pay | Admitting: Nurse Practitioner

## 2019-10-31 ENCOUNTER — Other Ambulatory Visit: Payer: Self-pay

## 2019-10-31 ENCOUNTER — Ambulatory Visit (INDEPENDENT_AMBULATORY_CARE_PROVIDER_SITE_OTHER): Payer: Medicare Other | Admitting: Nurse Practitioner

## 2019-10-31 VITALS — BP 128/80 | HR 65 | Temp 98.0°F

## 2019-10-31 DIAGNOSIS — F419 Anxiety disorder, unspecified: Secondary | ICD-10-CM | POA: Diagnosis not present

## 2019-10-31 DIAGNOSIS — F339 Major depressive disorder, recurrent, unspecified: Secondary | ICD-10-CM

## 2019-10-31 DIAGNOSIS — E782 Mixed hyperlipidemia: Secondary | ICD-10-CM | POA: Diagnosis not present

## 2019-10-31 DIAGNOSIS — N952 Postmenopausal atrophic vaginitis: Secondary | ICD-10-CM | POA: Diagnosis not present

## 2019-10-31 DIAGNOSIS — N760 Acute vaginitis: Secondary | ICD-10-CM | POA: Diagnosis not present

## 2019-10-31 DIAGNOSIS — B9689 Other specified bacterial agents as the cause of diseases classified elsewhere: Secondary | ICD-10-CM

## 2019-10-31 LAB — UA/M W/RFLX CULTURE, ROUTINE
Bilirubin, UA: NEGATIVE
Glucose, UA: NEGATIVE
Ketones, UA: NEGATIVE
Leukocytes,UA: NEGATIVE
Nitrite, UA: NEGATIVE
Protein,UA: NEGATIVE
RBC, UA: NEGATIVE
Specific Gravity, UA: 1.02 (ref 1.005–1.030)
Urobilinogen, Ur: 0.2 mg/dL (ref 0.2–1.0)
pH, UA: 5.5 (ref 5.0–7.5)

## 2019-10-31 LAB — WET PREP FOR TRICH, YEAST, CLUE
Clue Cell Exam: POSITIVE — AB
Trichomonas Exam: NEGATIVE
Yeast Exam: NEGATIVE

## 2019-10-31 MED ORDER — ESTRADIOL 0.5 MG PO TABS: 0.5000 mg | ORAL_TABLET | Freq: Every day | ORAL | 2 refills | Status: DC

## 2019-10-31 MED ORDER — CLINDAMYCIN PHOSPHATE 2 % VA CREA: 1.0000 | TOPICAL_CREAM | Freq: Every day | VAGINAL | 0 refills | Status: DC

## 2019-10-31 MED ORDER — CLONAZEPAM 1 MG PO TABS: 1.0000 mg | ORAL_TABLET | Freq: Two times a day (BID) | ORAL | 2 refills | Status: DC

## 2019-10-31 MED ORDER — FLUOXETINE HCL 20 MG PO CAPS: 20.0000 mg | ORAL_CAPSULE | Freq: Every day | ORAL | 3 refills | Status: DC

## 2019-10-31 MED ORDER — LINACLOTIDE 72 MCG PO CAPS: ORAL_CAPSULE | ORAL | 3 refills | Status: DC

## 2019-10-31 NOTE — Assessment & Plan Note (Addendum)
Chronic, ongoing.  Continue Klonopin at this time, patient educated on risks of medication and is not interested in reducing.  Continue Prozac, discussed with her changing to Zoloft if increase anxiety/depression noted.  At this time she denies SI/HI and wishes to maintain current regimen. °

## 2019-10-31 NOTE — Assessment & Plan Note (Signed)
Chronic, ongoing.  Continue current medication regimen and collaboration with cardiology.  Lipid panel and CMP today.

## 2019-10-31 NOTE — Progress Notes (Signed)
BP 128/80 (BP Location: Left Arm, Patient Position: Sitting, Cuff Size: Normal)   Pulse 65   Temp 98 F (36.7 C) (Oral)   SpO2 96%    Subjective:    Patient ID: Lindsey Bautista, female    DOB: [birth year], 59 y.o.   MRN: [id number]  HPI: Lindsey Bautista is a 59 y.o. female  Chief Complaint  Patient presents with  . Medication Refill  . Depression  . Anxiety  . Urinary Tract Infection    possible. BV.  [name] Hyperlipidemia    would like Cholesterol checked.   DEPRESSION Continues on Prozac 20 MG daily and Klonopin 1 MG BID.  Pt is aware of risks of benzo medication use to include increased sedation, respiratory suppression, falls, dependence and cardiovascular events.  Pt would like to continue treatment as benefit determined to outweigh risk.  She inquired into whether she could increase Klonopin, discussed with her that would prefer psychiatry involved if changes in this were needed.  At this time she agrees to continuing current dose. Mood status: stable Satisfied with current treatment?: yes Symptom severity: mild  Duration of current treatment : chronic Side effects: no Medication compliance: good compliance Psychotherapy/counseling: none Previous psychiatric medications: Prozac and Klonopin Depressed mood: no Anxious mood: yes Anhedonia: no Significant weight loss or gain: no Insomnia: yes hard to fall asleep Fatigue: at times Feelings of worthlessness or guilt: at times Impaired concentration/indecisiveness: yes Suicidal ideations: no Hopelessness: no Crying spells: no Depression screen [facility] 2/9 10/31/2019 05/14/2019 09/02/2018 05/27/2018 08/29/2017  Decreased Interest 2 3 1  0 3  Down, Depressed, Hopeless 2 3 1 2 2   PHQ - 2 Score 4 6 2 2 5   Altered sleeping 1 2 2 3 1   Tired, decreased energy 3 3 3 3 3   Change in appetite 2 3 1 3 3   Feeling bad or failure about yourself  2 3 1 2 1   Trouble concentrating 2 2 2 1 2   Moving slowly or fidgety/restless 2 3 1 2 1   Suicidal  thoughts 0 0 0 0 0  PHQ-9 Score 16 22 12 16 16   Difficult doing work/chores Somewhat difficult Somewhat difficult - Somewhat difficult -   GAD 7 : Generalized Anxiety Score 10/31/2019 05/14/2019 09/02/2018  Nervous, Anxious, on Edge 2 3 1   Control/stop worrying 3 3 3   Worry too much - different things 2 3 3   Trouble relaxing 1 3 1   Restless 1 2 2   Easily annoyed or irritable 3 3 2   Afraid - awful might happen 2 3 1   Total GAD 7 Score 14 20 13   Anxiety Difficulty Somewhat difficult Somewhat difficult -   HYPERLIPIDEMIA Had Zetia added at last cardiology visit, last saw Dr. Rockey Situ in November 2020.  Atorvastatin 80 MG daily and Zetia 10 MG daily. Hyperlipidemia status: good compliance Satisfied with current treatment?  yes Side effects:  no Medication compliance: good compliance Past cholesterol meds: Atorvastatin and Zetia Supplements: none Aspirin:  yes The 10-year ASCVD risk score Mikey Bussing DC Jr., et al., 2013) is: 6.5%   Values used to calculate the score:     Age: 26 years     Sex: Female     Is Non-Hispanic African American: No     Diabetic: No     Tobacco smoker: Yes     Systolic Blood Pressure: 0000000 mmHg     Is BP treated: No     HDL Cholesterol: 47 mg/dL     Total Cholesterol: 191  mg/dL Chest pain:  no Coronary artery disease:  no Family history CAD:  no Family history early CAD:  no   URINARY SYMPTOMS Currently dose reducing Estradiol orally and bringing in Estradiol vaginal cream due to vaginal atrophy.  Continues to have extreme dryness vaginally and recurrent infections.   Dysuria: no Urinary frequency: no Urgency: no Small volume voids: no Symptom severity: yes Urinary incontinence: no Foul odor: yes Hematuria: no Abdominal pain: yes Back pain: no Suprapubic pain/pressure: yes Flank pain: no Fever:  no Vomiting: no Relief with cranberry juice: none taken Relief with pyridium: none taken Status: stable Previous urinary tract infection: recurrent  BV Recurrent urinary tract infection: recurrent BV Sexual activity: No sexually active History of sexually transmitted disease: no Treatments attempted: none   Relevant past medical, surgical, family and social history reviewed and updated as indicated. Interim medical history since our last visit reviewed. Allergies and medications reviewed and updated.  Review of Systems  Constitutional: Negative for activity change, appetite change, diaphoresis, fatigue and fever.  Respiratory: Negative for cough, chest tightness, shortness of breath and wheezing.   Cardiovascular: Negative for chest pain, palpitations and leg swelling.  Gastrointestinal: Positive for abdominal pain. Negative for abdominal distention, blood in stool, constipation, diarrhea, nausea and vomiting.  Endocrine: Negative.   Genitourinary: Negative for dysuria, frequency, hematuria, urgency, vaginal discharge and vaginal pain.  Neurological: Negative.   Psychiatric/Behavioral: Negative.     Per HPI unless specifically indicated above     Objective:    BP 128/80 (BP Location: Left Arm, Patient Position: Sitting, Cuff Size: Normal)   Pulse 65   Temp 98 F (36.7 C) (Oral)   SpO2 96%   Wt Readings from Last 3 Encounters:  09/15/19 145 lb 6.4 oz (66 kg)  07/16/19 147 lb 4 oz (66.8 kg)  06/30/19 144 lb (65.3 kg)    Physical Exam Vitals and nursing note reviewed.  Constitutional:      General: She is awake. She is not in acute distress.    Appearance: She is well-developed, well-groomed and overweight. She is not ill-appearing.  HENT:     Head: Normocephalic.     Right Ear: Hearing normal.     Left Ear: Hearing normal.  Eyes:     General: Lids are normal.        Right eye: No discharge.        Left eye: No discharge.     Conjunctiva/sclera: Conjunctivae normal.     Pupils: Pupils are equal, round, and reactive to light.  Neck:     Thyroid: No thyromegaly.     Vascular: No carotid bruit.  Cardiovascular:      Rate and Rhythm: Normal rate and regular rhythm.     Heart sounds: Normal heart sounds. No murmur. No gallop.   Pulmonary:     Effort: Pulmonary effort is normal. No accessory muscle usage or respiratory distress.     Breath sounds: Normal breath sounds.  Abdominal:     General: Bowel sounds are normal. There is no distension or abdominal bruit.     Palpations: Abdomen is soft. There is no hepatomegaly or splenomegaly.     Tenderness: There is no abdominal tenderness. There is no right CVA tenderness, left CVA tenderness, guarding or rebound.     Hernia: No hernia is present.  Musculoskeletal:     Cervical back: Normal range of motion and neck supple.     Right lower leg: No edema.     Left lower  leg: No edema.  Skin:    General: Skin is warm and dry.  Neurological:     Mental Status: She is alert and oriented to person, place, and time.  Psychiatric:        Attention and Perception: Attention normal.        Mood and Affect: Mood normal.        Behavior: Behavior normal. Behavior is cooperative.        Thought Content: Thought content normal.        Judgment: Judgment normal.    Results for orders placed or performed in visit on 10/31/19  WET PREP FOR [place], YEAST, CLUE   Specimen: Vaginal Fluid   VAGINAL FLUI  Result Value Ref Range   Trichomonas Exam Negative Negative   Yeast Exam Negative Negative   Clue Cell Exam Positive (A) Negative  UA/M w/rflx Culture, Routine (STAT)   Specimen: Vaginal Fluid   VAGINAL FLUI  Result Value Ref Range   Specific Gravity, UA 1.020 1.005 - 1.030   pH, UA 5.5 5.0 - 7.5   Color, UA Yellow Yellow   Appearance Ur Hazy (A) Clear   Leukocytes,UA Negative Negative   Protein,UA Negative Negative/Trace   Glucose, UA Negative Negative   Ketones, UA Negative Negative   RBC, UA Negative Negative   Bilirubin, UA Negative Negative   Urobilinogen, Ur 0.2 0.2 - 1.0 mg/dL   Nitrite, UA Negative Negative      Assessment & Plan:   Problem List  Items Addressed This Visit      Genitourinary   Bacterial vaginosis    Acute, recurrent with wet prep noting clue cells.  UA negative.  Allergic to Flagyl, will send script for Clindamycin vaginal 2% cream which was beneficial last time.  She is aware to alert provider if any issues.  Return for worsening or ongoing issues.  Continue vaginal estrace for atrophy.  Referral to GYN for recurrent infections.      Relevant Orders   WET PREP FOR [place], [place] (Completed)   UA/M w/rflx Culture, Routine (STAT) (Completed)   Ambulatory referral to Gynecology   Atrophic vaginitis    Ongoing.  Dose reduce oral Estradiol to 0.5MG  with goal to discontinue oral treatment and continue vaginal estrace only.  Referral to GYN due to ongoing vaginal infections, guidance appreciated.      Relevant Orders   Ambulatory referral to Gynecology     Other   Depression, recurrent ([place]) - Primary    Chronic, stable.  Continue current medication regimen and adjust as needed.  Denies SI/HI.  Consider change from Prozac to Zoloft in future, which would offer benefit with anxiety and depression.      Relevant Medications   FLUoxetine (PROZAC) 20 MG capsule   Hyperlipidemia    Chronic, ongoing.  Continue current medication regimen and collaboration with cardiology.  Lipid panel and CMP today.        Relevant Orders   Comprehensive metabolic panel   Lipid Panel w/o Chol/HDL Ratio   Anxiety    Chronic, ongoing.  Continue Klonopin at this time, patient educated on risks of medication and is not interested in reducing.  Continue Prozac, discussed with her changing to Zoloft if increase anxiety/depression noted.  At this time she denies SI/HI and wishes to maintain current regimen.      Relevant Medications   FLUoxetine (PROZAC) 20 MG capsule   clonazePAM (KLONOPIN) 1 MG tablet       Follow up plan:  Return in about 6 weeks (around 12/12/2019) for BV and Mood.

## 2019-10-31 NOTE — Assessment & Plan Note (Signed)
Acute, recurrent with wet prep noting clue cells.  UA negative.  Allergic to Flagyl, will send script for Clindamycin vaginal 2% cream which was beneficial last time.  She is aware to alert provider if any issues.  Return for worsening or ongoing issues.  Continue vaginal estrace for atrophy.  Referral to GYN for recurrent infections.

## 2019-10-31 NOTE — Assessment & Plan Note (Signed)
Chronic, stable.  Continue current medication regimen and adjust as needed.  Denies SI/HI.  Consider change from Prozac to Zoloft in future, which would offer benefit with anxiety and depression. 

## 2019-10-31 NOTE — Patient Instructions (Signed)

## 2019-10-31 NOTE — Assessment & Plan Note (Signed)
Ongoing.  Dose reduce oral Estradiol to 0.5MG  with goal to discontinue oral treatment and continue vaginal estrace only.  Referral to GYN due to ongoing vaginal infections, guidance appreciated.

## 2019-11-01 LAB — COMPREHENSIVE METABOLIC PANEL
ALT: 15 IU/L (ref 0–32)
AST: 19 IU/L (ref 0–40)
Albumin/Globulin Ratio: 1.8 (ref 1.2–2.2)
Albumin: 4.4 g/dL (ref 3.8–4.9)
Alkaline Phosphatase: 88 IU/L (ref 39–117)
BUN/Creatinine Ratio: 14 (ref 9–23)
BUN: 11 mg/dL (ref 6–24)
Bilirubin Total: 0.5 mg/dL (ref 0.0–1.2)
CO2: 23 mmol/L (ref 20–29)
Calcium: 9.2 mg/dL (ref 8.7–10.2)
Chloride: 104 mmol/L (ref 96–106)
Creatinine, Ser: 0.8 mg/dL (ref 0.57–1.00)
GFR calc Af Amer: 94 mL/min/{1.73_m2} (ref >59)
GFR calc non Af Amer: 82 mL/min/{1.73_m2} (ref >59)
Globulin, Total: 2.5 g/dL (ref 1.5–4.5)
Glucose: 72 mg/dL (ref 65–99)
Potassium: 4.3 mmol/L (ref 3.5–5.2)
Sodium: 140 mmol/L (ref 134–144)
Total Protein: 6.9 g/dL (ref 6.0–8.5)

## 2019-11-01 LAB — LIPID PANEL W/O CHOL/HDL RATIO
Cholesterol, Total: 129 mg/dL (ref 100–199)
HDL: 54 mg/dL (ref >39)
LDL Chol Calc (NIH): 62 mg/dL (ref 0–99)
Triglycerides: 63 mg/dL (ref 0–149)
VLDL Cholesterol Cal: 13 mg/dL (ref 5–40)

## 2019-11-01 IMAGING — CT CT ABD-PELV W/O CM
2 of 4 series · 16 of 46 positions shown, 18 images · non-contrast
Comparison: 11/02/2016 and 08/03/2015.

CLINICAL DATA: Left lower quadrant abdominal pain and tenderness.
Nausea.

EXAM:
CT ABDOMEN AND PELVIS WITHOUT CONTRAST
TECHNIQUE: Multidetector CT imaging of the abdomen and pelvis was performed
following the standard protocol without IV contrast.

[Series 2: axial st · axial · 0.65mm/px · z∈[-1034,-634]mm · 13 of 88 slices shown, 15 images]
[im 4/88  soft-tissue]
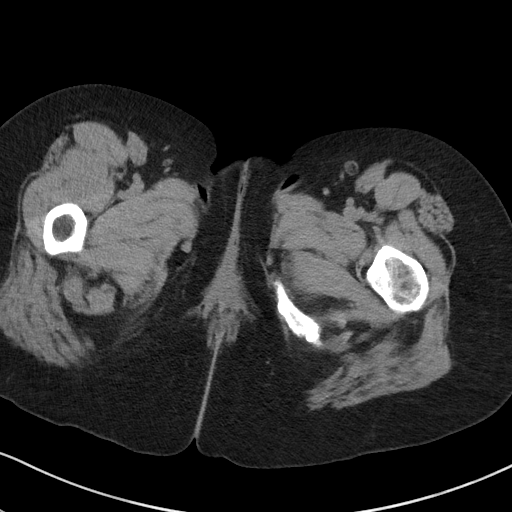
[im 4/88  bone]
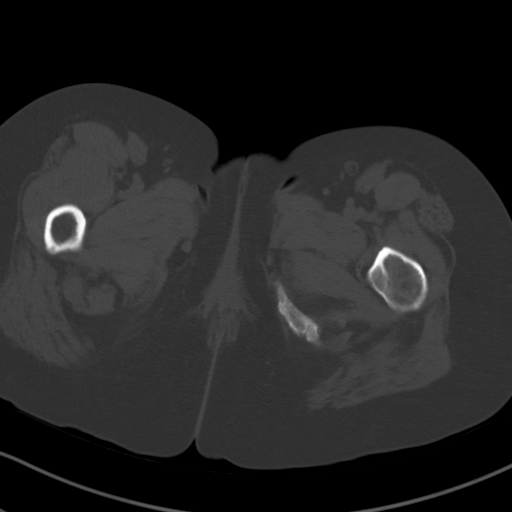
[im 12/88  soft-tissue]
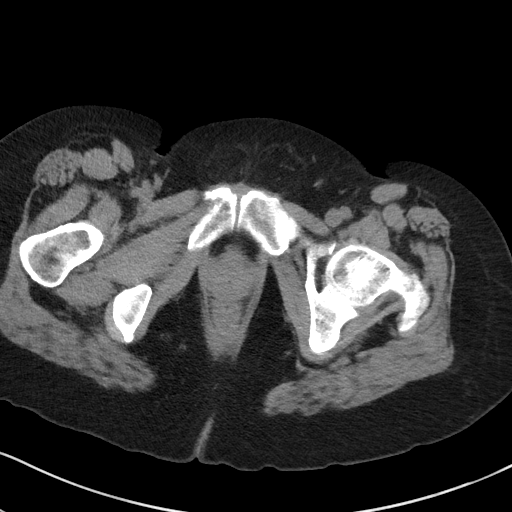
[im 19/88  soft-tissue]
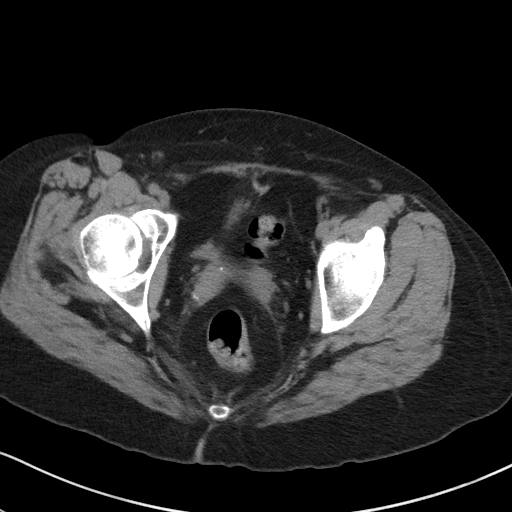
[im 23/88  soft-tissue]
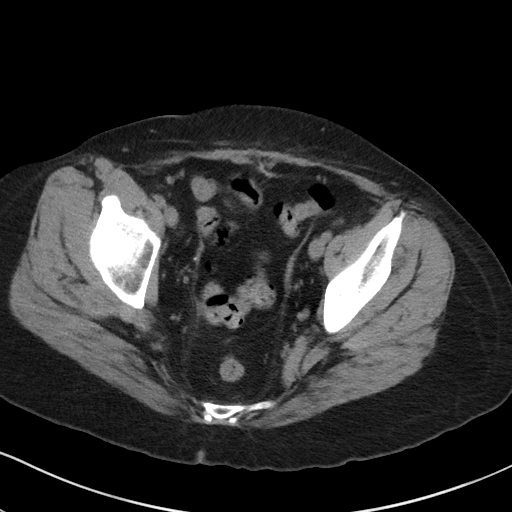
[im 31/88  soft-tissue]
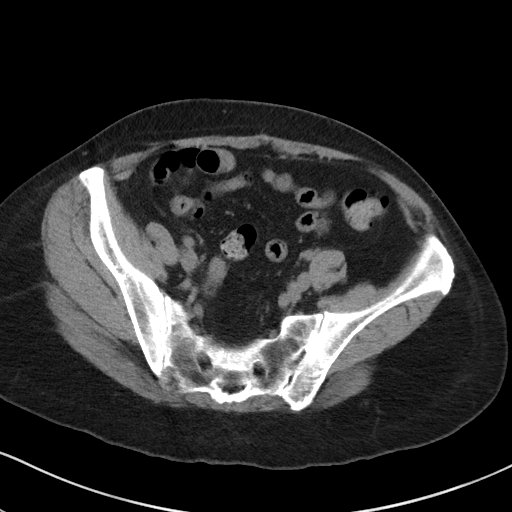
[im 38/88  soft-tissue]
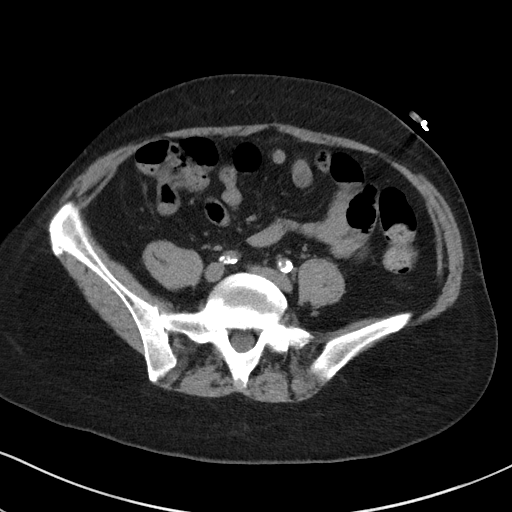
[im 46/88  soft-tissue]
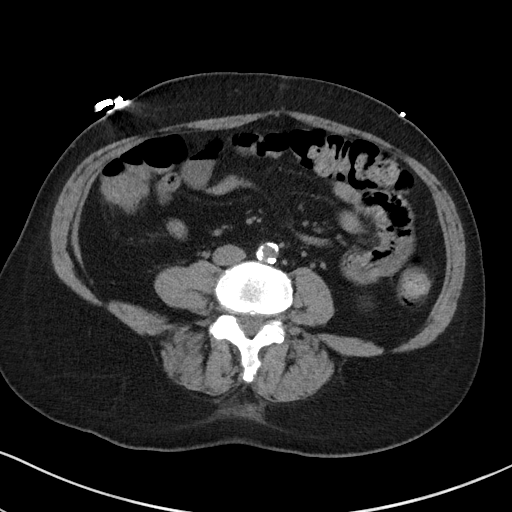
[im 50/88  soft-tissue]
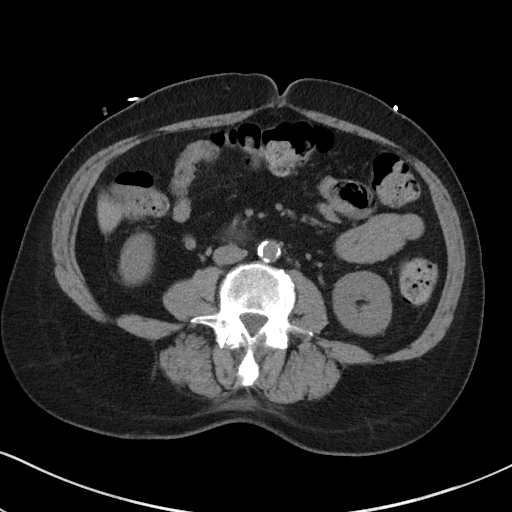
[im 57/88  soft-tissue]
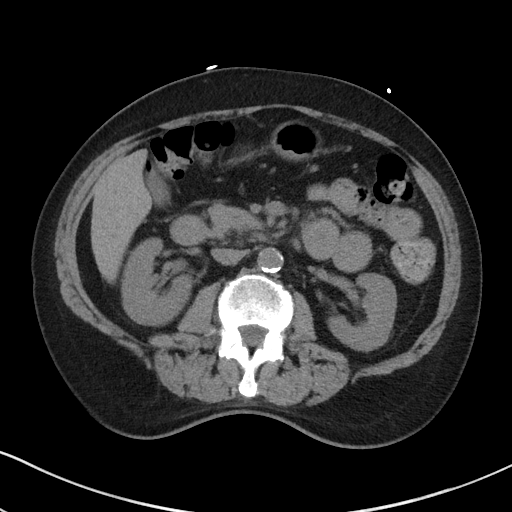
[im 57/88  bone]
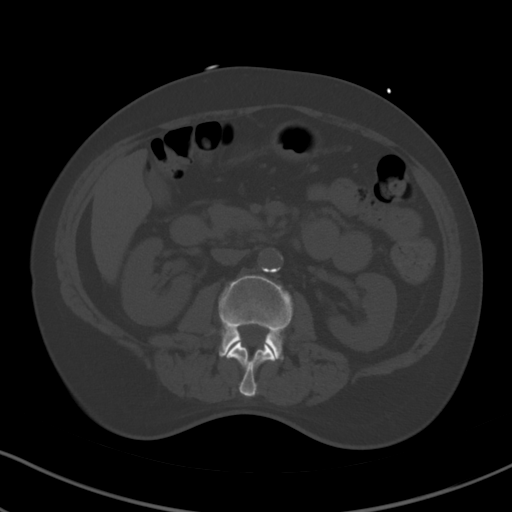
[im 65/88  soft-tissue]
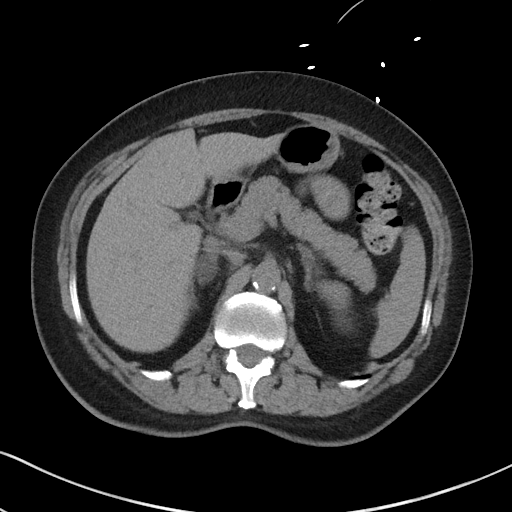
[im 69/88  soft-tissue]
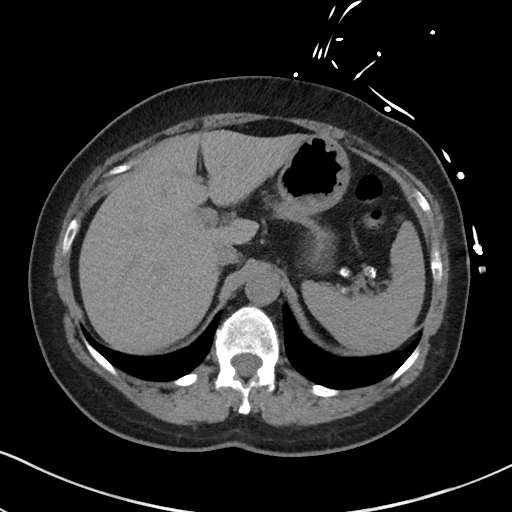
[im 76/88  soft-tissue]
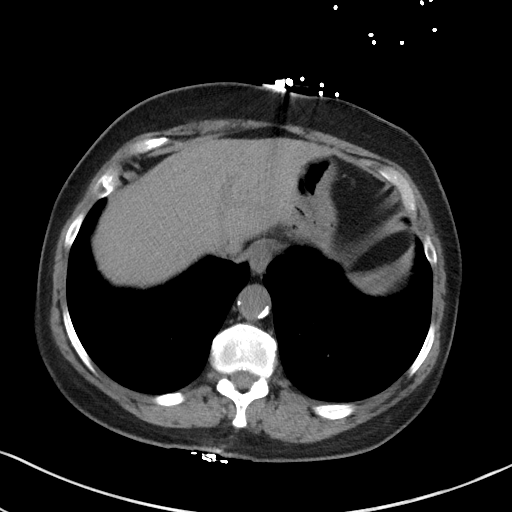
[im 84/88  soft-tissue]
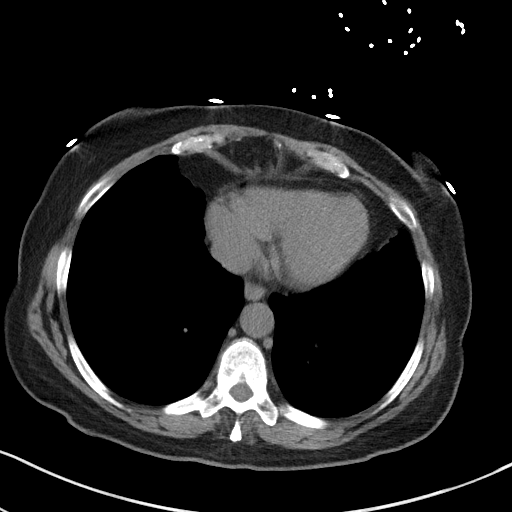

[Series 5: coronal st · coronal · 0.65mm/px · 3 of 76 slices shown]
[im 26/76  soft-tissue]
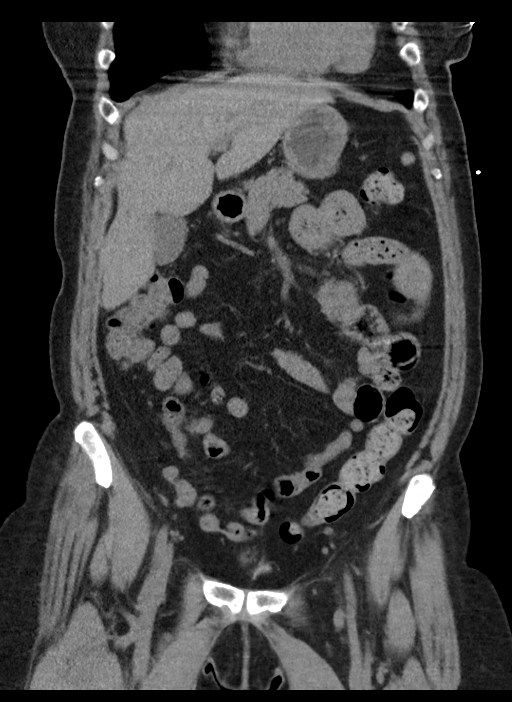
[im 34/76  soft-tissue]
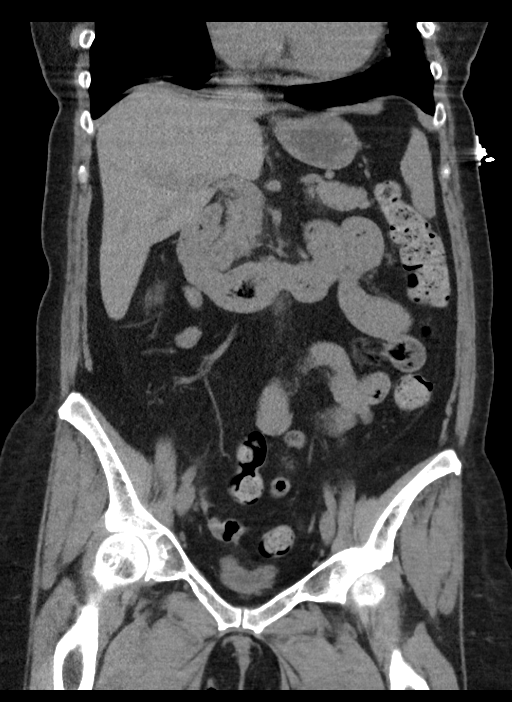
[im 42/76  soft-tissue]
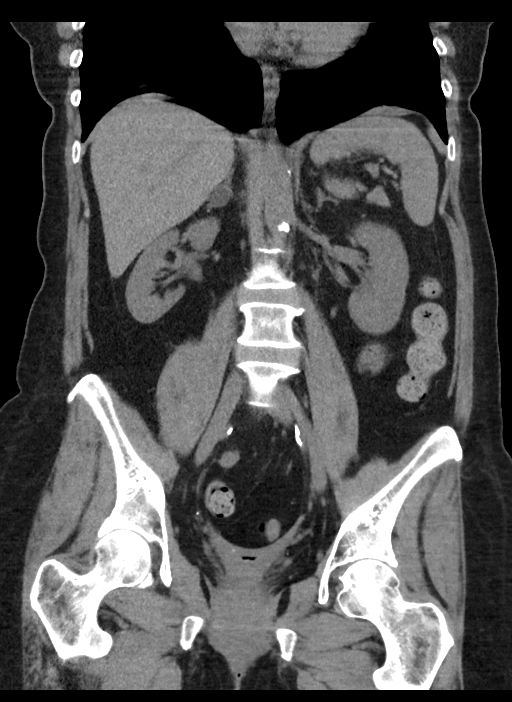

[16 of 46 positions shown; findings below may reference images not displayed]

FINDINGS: Lower chest: Unremarkable.

Hepatobiliary: No focal liver abnormality is seen. No gallstones,
gallbladder wall thickening, or biliary dilatation.

Pancreas: Unremarkable. No pancreatic ductal dilatation or
surrounding inflammatory changes.

Spleen: Normal in size without focal abnormality.

Adrenals/Urinary Tract: Slight increase in size of a previously
demonstrated low density right adrenal mass, measuring 1.8 x 1.3 cm
on image number 24 series 2. Normal appearing left adrenal gland,
kidneys and ureters. Minimal urine in the urinary bladder. No
urinary tract calculi or hydronephrosis seen.

Stomach/Bowel: Unremarkable stomach, small bowel and colon. No
evidence of appendicitis or diverticulitis. There is mildly
prominent stool throughout the colon.

Vascular/Lymphatic: Atheromatous arterial calcifications without
aneurysm. No enlarged lymph nodes.

Reproductive: Status post hysterectomy. No adnexal masses.

Other: No abdominal wall hernia or abnormality. No abdominopelvic
ascites.

Musculoskeletal: Mild lumbar and lower thoracic spine degenerative
changes.
IMPRESSION: 1. No acute abnormality.
2. Slight increase in size of the previously demonstrated right
adrenal adenoma.
3. Mildly prominent stool throughout the colon.

## 2019-11-01 NOTE — Progress Notes (Signed)
Good morning.  Please let Lindsey Bautista know: Your labs have returned and overall your cholesterol levels are MUCH improved from 5 months ago, LDL is below goal for stroke prevention.  This is great!!  Continue Atorvastatin.  Kidney and liver function remain in normal ranges.  Overall very good labs.  Have a great day!!

## 2019-11-06 ENCOUNTER — Ambulatory Visit (INDEPENDENT_AMBULATORY_CARE_PROVIDER_SITE_OTHER): Payer: Medicare Other

## 2019-11-06 ENCOUNTER — Other Ambulatory Visit: Payer: Self-pay

## 2019-11-06 DIAGNOSIS — M79604 Pain in right leg: Secondary | ICD-10-CM | POA: Diagnosis not present

## 2019-11-06 DIAGNOSIS — M79605 Pain in left leg: Secondary | ICD-10-CM

## 2019-11-06 DIAGNOSIS — R252 Cramp and spasm: Secondary | ICD-10-CM

## 2019-11-07 ENCOUNTER — Telehealth: Payer: Self-pay

## 2019-11-07 NOTE — Telephone Encounter (Signed)
Call to patient to make her aware of ABI results.   Pt verbalized understanding and had no further questions at this time.   No further orders.   Advised pt to call for any further questions or concerns.

## 2019-11-07 NOTE — Telephone Encounter (Signed)
-----   Message from Theora Gianotti, NP sent at 11/06/2019  3:55 PM EDT ----- ABIs are normal.

## 2019-11-07 NOTE — Telephone Encounter (Signed)
Left message on patients voicemail to please return our call.   

## 2019-11-07 NOTE — Telephone Encounter (Signed)
-----   Message from Theora Gianotti, NP sent at 11/07/2019  4:50 PM EDT ----- Normal ABIs

## 2019-11-10 ENCOUNTER — Ambulatory Visit: Payer: Medicare Other | Admitting: Nurse Practitioner

## 2019-11-10 NOTE — Telephone Encounter (Signed)
Call to patient to review ABI results and POC>   Pt verbalized understanding and had no further questions at this time.   Advised pt to call for any further questions or concerns.

## 2019-11-11 ENCOUNTER — Encounter: Payer: Self-pay | Admitting: Obstetrics and Gynecology

## 2019-11-11 ENCOUNTER — Other Ambulatory Visit: Payer: Self-pay

## 2019-11-11 ENCOUNTER — Ambulatory Visit (INDEPENDENT_AMBULATORY_CARE_PROVIDER_SITE_OTHER): Payer: Medicare Other | Admitting: Obstetrics and Gynecology

## 2019-11-11 VITALS — BP 130/70 | Ht 61.0 in | Wt 149.0 lb

## 2019-11-11 DIAGNOSIS — Z79890 Hormone replacement therapy (postmenopausal): Secondary | ICD-10-CM | POA: Diagnosis not present

## 2019-11-11 DIAGNOSIS — Z90710 Acquired absence of both cervix and uterus: Secondary | ICD-10-CM | POA: Diagnosis not present

## 2019-11-11 DIAGNOSIS — N898 Other specified noninflammatory disorders of vagina: Secondary | ICD-10-CM

## 2019-11-11 DIAGNOSIS — R1031 Right lower quadrant pain: Secondary | ICD-10-CM | POA: Diagnosis not present

## 2019-11-11 LAB — POCT WET PREP WITH KOH
Clue Cells Wet Prep HPF POC: NEGATIVE
KOH Prep POC: NEGATIVE
Trichomonas, UA: NEGATIVE
Yeast Wet Prep HPF POC: NEGATIVE

## 2019-11-11 NOTE — Patient Instructions (Signed)
I value your feedback and entrusting us with your care. If you get a [facility] patient survey, I would appreciate you taking the time to let us know about your experience today. Thank you!  As of July 31, 2019, your lab results will be released to your MyChart immediately, before I even have a chance to see them. Please give me time to review them and contact you if there are any abnormalities. Thank you for your patience.  

## 2019-11-11 NOTE — Progress Notes (Signed)
Lindsey Maple, MD   Chief Complaint  Patient presents with  . Vaginal Discharge    abnormal odor, no itchiness. Just finished 4 days ago recent abx. Recurrent BV's for the past 6 months    HPI:      Lindsey Bautista is a 59 y.o. No obstetric history on file. who LMP was No LMP recorded. Patient has had a hysterectomy., presents today for NP eval of recurrent BV and atrophic vaginitis, referred by PCP. Pt notes an increased d/c with non-fishy odor, no vaginal itching/irritation; sx for past 6 months. Notices smell in her clothes. Washes with unscented soaps, no dryer sheets, has scented detergent, no wipes, no thongs, no bathbombs or douches. No urinary incont. Pos wet preps with PCP, no cultures sent. Has been treated multiple times with flagyl (now allergic with anaphylaxis), clindamycin orally and cleocin vag crm (last treated 10/31/19) with sx recurrence about a wk later. Recently started probiotics. Has been on oral estradiol 1 mg for a few yrs, recently decreased to 0.5 mg dose, just started vaginal ERT twice wkly 12/20 without sx change.  Pt is not sex active. No vag bleeding/PMB. S/p hyst with BSO.   Pt with occas RLQ pain with leaning forward. Abd "rolls" and pt can see it. Causes a lot of pain. Sx improve if she leans back, causing "rolling" to stop. Hx of constipation, on linzesse now with some improvement. Neg CT abd/pelvis 2/21.   Past Medical History:  Diagnosis Date  . Atypical chest pain    a. 08/2016 Neg MV.  [name] COPD (chronic obstructive pulmonary disease) ([place])   . Coronary artery disease, non-occlusive    a.  LHC 06/19/2018: Mid LAD 50% with iFR 0.94  . Depression   . Diastolic dysfunction    a. 10/2016 Echo: EF 60-65%, no rwma, Gr1 DD, mild MR, nl LA size, nl RV fxn; b. 05/2018 Echo: EF 60-65%. No rwma. Mild MR. Nl RV fxn.  [name] GERD (gastroesophageal reflux disease)   . Hypotension   . IBS (irritable bowel syndrome)   . Right arm weakness   . Situational syncope     . TIA (transient ischemic attack)     Past Surgical History:  Procedure Laterality Date  . COLONOSCOPY WITH PROPOFOL N/A 11/22/2015   Procedure: COLONOSCOPY WITH PROPOFOL;  Surgeon: Manya Silvas, MD;  Location: [facility] ENDOSCOPY;  Service: Endoscopy;  Laterality: N/A;  . ESOPHAGOGASTRODUODENOSCOPY (EGD) WITH PROPOFOL N/A 11/22/2015   Procedure: ESOPHAGOGASTRODUODENOSCOPY (EGD) WITH PROPOFOL;  Surgeon: Manya Silvas, MD;  Location: [facility] ENDOSCOPY;  Service: Endoscopy;  Laterality: N/A;  . INTRAVASCULAR PRESSURE WIRE/FFR STUDY N/A 06/19/2018   Procedure: INTRAVASCULAR PRESSURE WIRE/FFR STUDY;  Surgeon: Nelva Bush, MD;  Location: [place] CV LAB;  Service: Cardiovascular;  Laterality: N/A;  . LEFT HEART CATH AND CORONARY ANGIOGRAPHY N/A 06/19/2018   Procedure: LEFT HEART CATH AND CORONARY ANGIOGRAPHY;  Surgeon: Nelva Bush, MD;  Location: [place] CV LAB;  Service: Cardiovascular;  Laterality: N/A;  . OOPHORECTOMY    . TONSILLECTOMY    . VAGINAL HYSTERECTOMY      Family History  Problem Relation Age of Onset  . Bladder Cancer Mother   . Heart failure Mother   . Renal Disease Mother   . COPD Father   . Heart disease Other   . COPD Other   . Stroke Other   . Breast cancer Paternal Aunt 35  . Colon cancer Brother     Social History  Socioeconomic History  . Marital status: Married    Spouse name: Emonie Polivka  . Number of children: 2  . Years of education: 71  . Highest education level: High school graduate  Occupational History  . Occupation: Unemployed  Tobacco Use  . Smoking status: Current Every Day Smoker    Packs/day: 1.00    Years: 39.00    Pack years: 39.00    Types: Cigarettes  . Smokeless tobacco: Never Used  Substance and Sexual Activity  . Alcohol use: No    Alcohol/week: 0.0 standard drinks  . Drug use: No  . Sexual activity: Not Currently    Birth control/protection: Surgical    Comment: Hysterectomy  Other Topics Concern   . Not on file  Social History Narrative   Lives at home with husband.    Caffeine use: 1 cup coffee per day   Social Determinants of Health   Financial Resource Strain: Low Risk   . Difficulty of Paying Living Expenses: Not hard at all  Food Insecurity: No Food Insecurity  . Worried About Charity fundraiser in the Last Year: Never true  . Ran Out of Food in the Last Year: Never true  Transportation Needs: No Transportation Needs  . Lack of Transportation (Medical): No  . Lack of Transportation (Non-Medical): No  Physical Activity: Inactive  . Days of Exercise per Week: 0 days  . Minutes of Exercise per Session: 0 min  Stress:   . Feeling of Stress :   Social Connections:   . Frequency of Communication with Friends and Family:   . Frequency of Social Gatherings with Friends and Family:   . Attends Religious Services:   . Active Member of Clubs or Organizations:   . Attends Archivist Meetings:   [name] Marital Status:   Intimate Partner Violence:   . Fear of Current or Ex-Partner:   . Emotionally Abused:   [name] Physically Abused:   . Sexually Abused:     Outpatient Medications Prior to Visit  Medication Sig Dispense Refill  . aspirin 81 MG tablet Take 81 mg by mouth daily.    [name] atorvastatin (LIPITOR) 80 MG tablet Take 1 tablet (80 mg total) by mouth daily at 6 PM. 90 tablet 3  . clonazePAM (KLONOPIN) 1 MG tablet Take 1 tablet (1 mg total) by mouth 2 (two) times daily. Need visit for further refills please. 60 tablet 2  . docusate sodium (COLACE) 100 MG capsule Take 100 mg by mouth 2 (two) times daily. Every other day    . EPINEPHrine 0.3 mg/0.3 mL IJ SOAJ injection Inject 0.3 mLs (0.3 mg total) into the muscle as needed for anaphylaxis. 1 each 0  . estradiol (ESTRACE VAGINAL) 0.1 MG/GM vaginal cream One applicator of cream intravaginally nightly for 1 week, then reduce to one applicator twice weekly only. 42.5 g 5  . estradiol (ESTRACE) 0.5 MG tablet Take 1 tablet (0.5  mg total) by mouth daily. 30 tablet 2  . FLUoxetine (PROZAC) 20 MG capsule Take 1 capsule (20 mg total) by mouth daily. 90 capsule 3  . linaclotide (LINZESS) 72 MCG capsule TAKE 1 CAPSULE BY MOUTH ONCE DAILY BEFORE BREAKFAST 90 capsule 3  . nitroGLYCERIN (NITROSTAT) 0.4 MG SL tablet Place 1 tablet (0.4 mg total) under the tongue every 5 (five) minutes as needed for chest pain. 50 tablet 3  . polyethylene glycol powder (GLYCOLAX/MIRALAX) 17 GM/SCOOP powder Take 17 g by mouth 2 (two) times daily as needed.  3350 g 1  . ezetimibe (ZETIA) 10 MG tablet Take 1 tablet (10 mg total) by mouth daily. 90 tablet 3   No facility-administered medications prior to visit.      ROS:  Review of Systems  Constitutional: Negative for fever.  Gastrointestinal: Positive for constipation. Negative for blood in stool, diarrhea, nausea and vomiting.  Genitourinary: Positive for pelvic pain and vaginal discharge. Negative for dyspareunia, dysuria, flank pain, frequency, hematuria, urgency, vaginal bleeding and vaginal pain.  Musculoskeletal: Negative for back pain.  Skin: Negative for rash.  BREAST: No symptoms   OBJECTIVE:   Vitals:  BP 130/70   Ht 5\' 1"  (1.549 m)   Wt 149 lb (67.6 kg)   BMI 28.15 kg/m   Physical Exam Vitals reviewed.  Constitutional:      Appearance: She is well-developed.  Pulmonary:     Effort: Pulmonary effort is normal.  Abdominal:     Palpations: Abdomen is soft. There is no mass.     Tenderness: There is no abdominal tenderness. There is no guarding or rebound.     Hernia: No hernia is present.  Genitourinary:    General: Normal vulva.     Pubic Area: No rash.      Labia:        Right: No rash, tenderness or lesion.        Left: No rash, tenderness or lesion.      Vagina: Normal. No vaginal discharge, erythema or tenderness.     Uterus: Absent. Not enlarged and not tender.      Adnexa: Right adnexa normal and left adnexa normal.       Right: No mass or tenderness.          Left: No mass or tenderness.       Comments: UTERUS/CX SURG ABSENT; NEG EXAM; VAGINAL TISSUE LOOKS HEALTHY Musculoskeletal:        General: Normal range of motion.     Cervical back: Normal range of motion.  Skin:    General: Skin is warm and dry.  Neurological:     General: No focal deficit present.     Mental Status: She is alert and oriented to person, place, and time.  Psychiatric:        Mood and Affect: Mood normal.        Behavior: Behavior normal.        Thought Content: Thought content normal.        Judgment: Judgment normal.     Results: Results for orders placed or performed in visit on 11/11/19 (from the past 24 hour(s))  POCT Wet Prep with KOH     Status: Normal   Collection Time: 11/11/19  2:39 PM  Result Value Ref Range   Trichomonas, UA Negative    Clue Cells Wet Prep HPF POC neg    Epithelial Wet Prep HPF POC     Yeast Wet Prep HPF POC neg    Bacteria Wet Prep HPF POC     RBC Wet Prep HPF POC     WBC Wet Prep HPF POC     KOH Prep POC Negative Negative     Assessment/Plan: Vaginal discharge - Plan: POCT Wet Prep with KOH, Other/Misc lab test; Neg wet prep/exam. Check MDL culture for BV and AV. Will call with results. If neg, most likely normal d/c. D/C scented detergent in meantime. Cont probiotics.   Vaginal odor--rule out vag infection.   RLQ abdominal pain--Question MSK given visible "rolling" movements and  only triggered by certain position. Neg exam, neg CT scan. Try ab exercises to strengthen muscles. Not GYN etiology due to hyst/BSO. F/u with PCP prn.  Hormone replacement therapy (HRT)--given hx of TIA/CVA and CVD, suggested pt d/c ERT after discussing with PCP.     Return if symptoms worsen or fail to improve.  Sadye Kiernan B. Salimah Martinovich, PA-C 11/11/2019 2:43 PM

## 2019-11-19 ENCOUNTER — Telehealth: Payer: Self-pay

## 2019-11-19 NOTE — Telephone Encounter (Signed)
Pt left msg on triage line asking if her test results (MDL) were back. Advised no, we will call her once we have them.

## 2019-11-20 ENCOUNTER — Other Ambulatory Visit: Payer: Self-pay

## 2019-11-20 ENCOUNTER — Emergency Department
Admission: EM | Admit: 2019-11-20 | Discharge: 2019-11-20 | Disposition: A | Discharge status: Home / Self Care | Payer: Medicare Other | Attending: Emergency Medicine | Admitting: Emergency Medicine

## 2019-11-20 ENCOUNTER — Emergency Department: Payer: Medicare Other

## 2019-11-20 ENCOUNTER — Ambulatory Visit: Payer: Self-pay | Admitting: *Deleted

## 2019-11-20 DIAGNOSIS — Z79899 Other long term (current) drug therapy: Secondary | ICD-10-CM | POA: Insufficient documentation

## 2019-11-20 DIAGNOSIS — F17210 Nicotine dependence, cigarettes, uncomplicated: Secondary | ICD-10-CM | POA: Diagnosis not present

## 2019-11-20 DIAGNOSIS — R109 Unspecified abdominal pain: Secondary | ICD-10-CM

## 2019-11-20 DIAGNOSIS — E279 Disorder of adrenal gland, unspecified: Secondary | ICD-10-CM | POA: Diagnosis not present

## 2019-11-20 DIAGNOSIS — Z7982 Long term (current) use of aspirin: Secondary | ICD-10-CM | POA: Diagnosis not present

## 2019-11-20 DIAGNOSIS — R1031 Right lower quadrant pain: Secondary | ICD-10-CM | POA: Diagnosis not present

## 2019-11-20 LAB — CBC
HCT: 45 % (ref 36.0–46.0)
Hemoglobin: 14.9 g/dL (ref 12.0–15.0)
MCH: 30.5 pg (ref 26.0–34.0)
MCHC: 33.1 g/dL (ref 30.0–36.0)
MCV: 92.2 fL (ref 80.0–100.0)
Platelets: 272 10*3/uL (ref 150–400)
RBC: 4.88 MIL/uL (ref 3.87–5.11)
RDW: 12.9 % (ref 11.5–15.5)
WBC: 5.5 10*3/uL (ref 4.0–10.5)
nRBC: 0 % (ref 0.0–0.2)

## 2019-11-20 LAB — COMPREHENSIVE METABOLIC PANEL
ALT: 19 U/L (ref 0–44)
AST: 21 U/L (ref 15–41)
Albumin: 4.7 g/dL (ref 3.5–5.0)
Alkaline Phosphatase: 79 U/L (ref 38–126)
Anion gap: 10 (ref 5–15)
BUN: 12 mg/dL (ref 6–20)
CO2: 27 mmol/L (ref 22–32)
Calcium: 9.5 mg/dL (ref 8.9–10.3)
Chloride: 103 mmol/L (ref 98–111)
Creatinine, Ser: 0.78 mg/dL (ref 0.44–1.00)
GFR calc Af Amer: >60 mL/min (ref >60)
GFR calc non Af Amer: >60 mL/min (ref >60)
Glucose, Bld: 86 mg/dL (ref 70–99)
Potassium: 4.2 mmol/L (ref 3.5–5.1)
Sodium: 140 mmol/L (ref 135–145)
Total Bilirubin: 0.9 mg/dL (ref 0.3–1.2)
Total Protein: 8 g/dL (ref 6.5–8.1)

## 2019-11-20 LAB — URINALYSIS, COMPLETE (UACMP) WITH MICROSCOPIC
Bacteria, UA: NONE SEEN
Bilirubin Urine: NEGATIVE
Glucose, UA: NEGATIVE mg/dL
Ketones, ur: NEGATIVE mg/dL
Leukocytes,Ua: NEGATIVE
Nitrite: NEGATIVE
Protein, ur: NEGATIVE mg/dL
Specific Gravity, Urine: 1.004 — ABNORMAL LOW (ref 1.005–1.030)
pH: 6 (ref 5.0–8.0)

## 2019-11-20 LAB — LIPASE, BLOOD: Lipase: 43 U/L (ref 11–51)

## 2019-11-20 MED ORDER — HYDROCODONE-ACETAMINOPHEN 5-325 MG PO TABS: 1.0000 | ORAL_TABLET | Freq: Four times a day (QID) | ORAL | 0 refills | Status: DC | PRN

## 2019-11-20 MED ORDER — ONDANSETRON HCL 4 MG/2ML IJ SOLN
4.0000 mg | Freq: Once | INTRAMUSCULAR | Status: AC
  Administered 2019-11-20: 4 mg via INTRAVENOUS
  Filled 2019-11-20: qty 2

## 2019-11-20 MED ORDER — MORPHINE SULFATE (PF) 4 MG/ML IV SOLN
INTRAVENOUS | Status: AC
  Administered 2019-11-20: 17:00:00 4 mg via INTRAVENOUS
  Filled 2019-11-20: qty 1

## 2019-11-20 MED ORDER — MORPHINE SULFATE (PF) 4 MG/ML IV SOLN: 4.0000 mg | Freq: Once | INTRAVENOUS | Status: AC

## 2019-11-20 MED ORDER — KETOROLAC TROMETHAMINE 30 MG/ML IJ SOLN
15.0000 mg | Freq: Once | INTRAMUSCULAR | Status: DC
  Filled 2019-11-20: qty 1

## 2019-11-20 NOTE — Telephone Encounter (Signed)
Patient calling with constant right-sided abdominal pain that began over the weekend and has worsened overnight. Rates a 10 on the scale.Denies CP/SOB/Sweating/dizziness. Pain radiates to the right side and around.abdomen distended and hard. Voiding is painful, denies blood in urine. Recently with diarrhea for several days. LBM yesterday was soft no blood or mucous noticed. Nausea no vomiting. No fever. Has not tried any medications. Had dx of bacterial vaginosis recently with swabs sent out-not available yet. Advised ED at this time. Husband to drive her now.   Reason for Disposition . [1] SEVERE pain (e.g., excruciating) AND [2] present > 1 hour  Answer Assessment - Initial Assessment Questions 1. LOCATION: "Where does it hurt?"      Right side of abdomen towards the back 2. RADIATION: "Does the pain shoot anywhere else?" (e.g., chest, back)     To the right side and towards the back 3. ONSET: "When did the pain begin?" (e.g., minutes, hours or days ago)      Began over the weekend, 4 days ago. 4. SUDDEN: "Gradual or sudden onset?"     suddely 5. PATTERN "Does the pain come and go, or is it constant?"    - If constant: "Is it getting better, staying the same, or worsening?"      (Note: Constant means the pain never goes away completely; most serious pain is constant and it progresses)     - If intermittent: "How long does it last?" "Do you have pain now?"     (Note: Intermittent means the pain goes away completely between bouts)    Reports it is constant and has worsened over the last 1-2 days. 6. SEVERITY: "How bad is the pain?"  (e.g., Scale 1-10; mild, moderate, or severe)   - MILD (1-3): doesn't interfere with normal activities, abdomen soft and not tender to touch    - MODERATE (4-7): interferes with normal activities or awakens from sleep, tender to touch    - SEVERE (8-10): excruciating pain, doubled over, unable to do any normal activities      Severe-10 on the scale. Softly  crying 7. RECURRENT SYMPTOM: "Have you ever had this type of abdominal pain before?" If so, ask: "When was the last time?" and "What happened that time?"      No 8. CAUSE: "What do you think is causing the abdominal pain?"     Recently diagnosed with bacterial vaginosis she reports. Unsure, really. 9. RELIEVING/AGGRAVATING FACTORS: "What makes it better or worse?" (e.g., movement, antacids, bowel movement)     Nothing 10. OTHER SYMPTOMS: "Has there been any vomiting, diarrhea, constipation, or urine problems?"       Nausea, diarrhea over the last few days. Yesterday's BM was soft.  11. PREGNANCY: "Is there any chance you are pregnant?" "When was your last menstrual period?"       na  Protocols used: ABDOMINAL PAIN - [facility]

## 2019-11-20 NOTE — ED Provider Notes (Signed)
[facility] Emergency Department Provider Note   ____________________________________________    I have reviewed the triage vital signs and the nursing notes.   HISTORY  Chief Complaint Flank Pain     HPI Lindsey Bautista is a 59 y.o. female with a history as noted below who presents today with complaints of right lower quadrant and right flank pain.  Patient reports this is been ongoing for about 5 days now, she reports it is varying in pain level, ranges between mild to moderate.  Does not radiate anywhere else.  Has had some nausea, no vomiting.  No fevers or chills.  No dysuria.  No hematuria noted.  No history of kidney stones reported.  Has not take anything for this.  Past Medical History:  Diagnosis Date  . Atypical chest pain    a. 08/2016 Neg MV.  [name] COPD (chronic obstructive pulmonary disease) ([place])   . Coronary artery disease, non-occlusive    a.  LHC 06/19/2018: Mid LAD 50% with iFR 0.94  . Depression   . Diastolic dysfunction    a. 10/2016 Echo: EF 60-65%, no rwma, Gr1 DD, mild MR, nl LA size, nl RV fxn; b. 05/2018 Echo: EF 60-65%. No rwma. Mild MR. Nl RV fxn.  [name] GERD (gastroesophageal reflux disease)   . Hypotension   . IBS (irritable bowel syndrome)   . Right arm weakness   . Situational syncope   . TIA (transient ischemic attack)     Patient Active Problem List   Diagnosis Date Noted  . Adrenal adenoma, right 09/15/2019  . Atrophic vaginitis 08/01/2019  . Advance care planning 04/16/2019  . Bacterial vaginosis 11/18/2018  . Slow transit constipation 11/18/2018  . Coronary artery disease, non-occlusive 09/02/2018  . Fibromyalgia 08/29/2017  . Conversion disorder with mixed symptoms 03/30/2016  . Aortic atherosclerosis ([place]) 12/29/2015  . Adrenal nodule ([place]) 12/29/2015  . COPD (chronic obstructive pulmonary disease) ([place]) 12/13/2015  . Gastroesophageal reflux disease without esophagitis   . PAD (peripheral artery disease)  ([place]) 10/22/2015  . Hyperlipidemia 10/22/2015  . Cervical paraspinal muscle spasm 09/30/2015  . Benzodiazepine dependence, continuous ([place]) 09/08/2015  . Situational syncope 06/07/2015  . Nicotine dependence, cigarettes, uncomplicated [id number]  . Depression, recurrent ([place]) 12/19/2013  . Anxiety 12/19/2013    Past Surgical History:  Procedure Laterality Date  . COLONOSCOPY WITH PROPOFOL N/A 11/22/2015   Procedure: COLONOSCOPY WITH PROPOFOL;  Surgeon: Manya Silvas, MD;  Location: [facility] ENDOSCOPY;  Service: Endoscopy;  Laterality: N/A;  . ESOPHAGOGASTRODUODENOSCOPY (EGD) WITH PROPOFOL N/A 11/22/2015   Procedure: ESOPHAGOGASTRODUODENOSCOPY (EGD) WITH PROPOFOL;  Surgeon: Manya Silvas, MD;  Location: [facility] ENDOSCOPY;  Service: Endoscopy;  Laterality: N/A;  . INTRAVASCULAR PRESSURE WIRE/FFR STUDY N/A 06/19/2018   Procedure: INTRAVASCULAR PRESSURE WIRE/FFR STUDY;  Surgeon: Nelva Bush, MD;  Location: [place] CV LAB;  Service: Cardiovascular;  Laterality: N/A;  . LEFT HEART CATH AND CORONARY ANGIOGRAPHY N/A 06/19/2018   Procedure: LEFT HEART CATH AND CORONARY ANGIOGRAPHY;  Surgeon: Nelva Bush, MD;  Location: [place] CV LAB;  Service: Cardiovascular;  Laterality: N/A;  . OOPHORECTOMY    . TONSILLECTOMY    . VAGINAL HYSTERECTOMY      Prior to Admission medications   Medication Sig Start Date End Date Taking? Authorizing Provider  aspirin 81 MG tablet Take 81 mg by mouth daily.    [provider]  atorvastatin (LIPITOR) 80 MG tablet Take 1 tablet (80 mg total) by mouth daily at 6 PM. 02/05/19  Theora Gianotti, NP  clonazePAM (KLONOPIN) 1 MG tablet Take 1 tablet (1 mg total) by mouth 2 (two) times daily. Need visit for further refills please. 10/31/19   [name], Henrine Screws T, NP  docusate sodium (COLACE) 100 MG capsule Take 100 mg by mouth 2 (two) times daily. Every other day    [provider]  EPINEPHrine 0.3 mg/0.3 mL IJ SOAJ injection Inject  0.3 mLs (0.3 mg total) into the muscle as needed for anaphylaxis. 05/09/19   Duffy Bruce, MD  estradiol (ESTRACE VAGINAL) 0.1 MG/GM vaginal cream One applicator of cream intravaginally nightly for 1 week, then reduce to one applicator twice weekly only. 08/01/19   [name], Henrine Screws T, NP  estradiol (ESTRACE) 0.5 MG tablet Take 1 tablet (0.5 mg total) by mouth daily. 10/31/19   [name], Henrine Screws T, NP  ezetimibe (ZETIA) 10 MG tablet Take 1 tablet (10 mg total) by mouth daily. 07/15/19 10/13/19  Rise Mu, PA-C  FLUoxetine (PROZAC) 20 MG capsule Take 1 capsule (20 mg total) by mouth daily. 10/31/19   [name], Henrine Screws T, NP  HYDROcodone-acetaminophen (NORCO/VICODIN) 5-325 MG tablet Take 1 tablet by mouth every 6 (six) hours as needed for severe pain. 11/20/19   Lavonia Drafts, MD  linaclotide (LINZESS) 72 MCG capsule TAKE 1 CAPSULE BY MOUTH ONCE DAILY BEFORE BREAKFAST 10/31/19   [name], Henrine Screws T, NP  nitroGLYCERIN (NITROSTAT) 0.4 MG SL tablet Place 1 tablet (0.4 mg total) under the tongue every 5 (five) minutes as needed for chest pain. 04/16/19   [name], MD  polyethylene glycol powder (GLYCOLAX/MIRALAX) 17 GM/SCOOP powder Take 17 g by mouth 2 (two) times daily as needed. 04/16/19   [name], MD     Allergies Flagyl [metronidazole], Penicillin g, Iodinated diagnostic agents, and Flomax [tamsulosin hcl]  Family History  Problem Relation Age of Onset  . Bladder Cancer Mother   . Heart failure Mother   . Renal Disease Mother   . COPD Father   . Heart disease Other   . COPD Other   . Stroke Other   . Breast cancer Paternal Aunt 22  . Colon cancer Brother     Social History Social History   Tobacco Use  . Smoking status: Current Every Day Smoker    Packs/day: 1.00    Years: 39.00    Pack years: 39.00    Types: Cigarettes  . Smokeless tobacco: Never Used  Substance Use Topics  . Alcohol use: No    Alcohol/week: 0.0 standard drinks  . Drug use: No    Review of Systems   Constitutional: No fever/chills Eyes: No visual changes.  ENT: No sore throat. Cardiovascular: Denies chest pain. Respiratory: Denies shortness of breath. Gastrointestinal: as above Genitourinary: as above Musculoskeletal: Negative for back pain. Skin: Negative for rash. Neurological: Negative for headaches or weakness   ____________________________________________   PHYSICAL EXAM:  VITAL SIGNS: ED Triage Vitals  Enc Vitals Group     BP 11/20/19 1240 (!) 124/52     Pulse Rate 11/20/19 1240 69     Resp 11/20/19 1240 18     Temp 11/20/19 1240 98 F (36.7 C)     Temp Source 11/20/19 1240 Oral     SpO2 11/20/19 1240 97 %     Weight 11/20/19 1241 67.6 kg (149 lb)     Height 11/20/19 1241 1.549 m (5\' 1" )     Head Circumference --      Peak Flow --  Pain Score 11/20/19 1241 10     Pain Loc --      Pain Edu? --      Excl. in [place]? --     Constitutional: Alert and oriented. No acute distress. \ . Nose: No congestion/rhinnorhea. Mouth/Throat: Mucous membranes are moist.   Neck:  Painless ROM Cardiovascular: Normal rate, regular rhythm. Grossly normal heart sounds.  Good peripheral circulation. Respiratory: Normal respiratory effort.  No retractions. Lungs CTAB. Gastrointestinal: Soft  No distention.  No CVA tenderness. Reassuring exam Genitourinary: deferred Musculoskeletal: .  Warm and well perfused Neurologic:  Normal speech and language. No gross focal neurologic deficits are appreciated.  Skin:  Skin is warm, dry and intact. No rash noted. Psychiatric: Mood and affect are normal. Speech and behavior are normal.  ____________________________________________   LABS (all labs ordered are listed, but only abnormal results are displayed)  Labs Reviewed  URINALYSIS, COMPLETE (UACMP) WITH MICROSCOPIC - Abnormal; Notable for the following components:      Result Value   Color, Urine STRAW (*)    APPearance CLEAR (*)    Specific Gravity, Urine 1.004 (*)    Hgb urine  dipstick SMALL (*)    All other components within normal limits  COMPREHENSIVE METABOLIC PANEL  CBC  LIPASE, BLOOD   ____________________________________________  EKG   ____________________________________________  RADIOLOGY Ct abd/pel no ureterolithiasis, no abnormality noted ____________________________________________   PROCEDURES  Procedure(s) performed: No  Procedures   Critical Care performed: No ____________________________________________   INITIAL IMPRESSION / ASSESSMENT AND PLAN / ED COURSE  Pertinent labs & imaging results that were available during my care of the patient were reviewed by me and considered in my medical decision making (see chart for details).  Patient presents with right lower quadrant/right flank pain times approximately 5 days.  Varying degrees of pain, has not take anything for this.  Denies dysuria.  Suspicious for urolithiasis she believes that she has had an appendectomy.  We will treat with IV morphine, IV Zofran and obtain CT imaging.  Lab work is overall quite reassuring, normal white blood cell count, normal BMP, normal CBC  Patient is feeling better after IV morphine and Zofran.  CT scan is overall unremarkable, she does have an IV dye allergy which limited CT imaging.  I discussed with her admission for further evaluation but she declined in favor of outpatient follow-up with GI.  She does know to return immediately if any worsening of her pain    ____________________________________________   FINAL CLINICAL IMPRESSION(S) / ED DIAGNOSES  Final diagnoses:  Flank pain        Note:  This document was prepared using Dragon voice recognition software and may include unintentional dictation errors.   Lavonia Drafts, MD 11/20/19 2036

## 2019-11-20 NOTE — ED Triage Notes (Signed)
Pt c/o right side abd pain that radiates into back since Friday - the pain has intensified - pt reports hx of BV that has cleared - pt reports nausea - denies diarrhea - last BM yesterday - PCP sent to ED

## 2019-11-20 NOTE — Telephone Encounter (Signed)
Noted, looks like she may be discharged this evening, please call tomorrow to schedule ER follow-up is she is discharged.

## 2019-11-21 NOTE — Telephone Encounter (Signed)
Noted, thank you

## 2019-11-21 NOTE — Telephone Encounter (Signed)
Called pt she has been discharged she states that she is suppose to be getting a call from a gastrologist to have an appt so she did not want to schedule the hosp f/u right now.

## 2019-11-22 ENCOUNTER — Other Ambulatory Visit: Payer: Self-pay

## 2019-11-22 ENCOUNTER — Emergency Department: Payer: Medicare Other

## 2019-11-22 ENCOUNTER — Emergency Department
Admission: EM | Admit: 2019-11-22 | Discharge: 2019-11-22 | Disposition: A | Discharge status: Home / Self Care | Payer: Medicare Other | Attending: Emergency Medicine | Admitting: Emergency Medicine

## 2019-11-22 ENCOUNTER — Encounter: Payer: Self-pay | Admitting: Emergency Medicine

## 2019-11-22 DIAGNOSIS — Z8673 Personal history of transient ischemic attack (TIA), and cerebral infarction without residual deficits: Secondary | ICD-10-CM | POA: Insufficient documentation

## 2019-11-22 DIAGNOSIS — F17210 Nicotine dependence, cigarettes, uncomplicated: Secondary | ICD-10-CM | POA: Insufficient documentation

## 2019-11-22 DIAGNOSIS — Z7982 Long term (current) use of aspirin: Secondary | ICD-10-CM | POA: Diagnosis not present

## 2019-11-22 DIAGNOSIS — R1011 Right upper quadrant pain: Secondary | ICD-10-CM | POA: Insufficient documentation

## 2019-11-22 DIAGNOSIS — R112 Nausea with vomiting, unspecified: Secondary | ICD-10-CM | POA: Diagnosis not present

## 2019-11-22 DIAGNOSIS — I2510 Atherosclerotic heart disease of native coronary artery without angina pectoris: Secondary | ICD-10-CM | POA: Diagnosis not present

## 2019-11-22 DIAGNOSIS — R109 Unspecified abdominal pain: Secondary | ICD-10-CM

## 2019-11-22 DIAGNOSIS — Z79899 Other long term (current) drug therapy: Secondary | ICD-10-CM | POA: Insufficient documentation

## 2019-11-22 DIAGNOSIS — R1031 Right lower quadrant pain: Secondary | ICD-10-CM | POA: Diagnosis not present

## 2019-11-22 DIAGNOSIS — J449 Chronic obstructive pulmonary disease, unspecified: Secondary | ICD-10-CM | POA: Diagnosis not present

## 2019-11-22 LAB — URINALYSIS, COMPLETE (UACMP) WITH MICROSCOPIC
Bilirubin Urine: NEGATIVE
Glucose, UA: NEGATIVE mg/dL
Hgb urine dipstick: NEGATIVE
Ketones, ur: NEGATIVE mg/dL
Leukocytes,Ua: NEGATIVE
Nitrite: NEGATIVE
Protein, ur: 30 mg/dL — AB
Specific Gravity, Urine: 1.03 (ref 1.005–1.030)
pH: 5 (ref 5.0–8.0)

## 2019-11-22 LAB — CBC WITH DIFFERENTIAL/PLATELET
Abs Immature Granulocytes: 0.02 10*3/uL (ref 0.00–0.07)
Basophils Absolute: 0 10*3/uL (ref 0.0–0.1)
Basophils Relative: 1 %
Eosinophils Absolute: 0.1 10*3/uL (ref 0.0–0.5)
Eosinophils Relative: 1 %
HCT: 43.6 % (ref 36.0–46.0)
Hemoglobin: 14.6 g/dL (ref 12.0–15.0)
Immature Granulocytes: 0 %
Lymphocytes Relative: 31 %
Lymphs Abs: 2 10*3/uL (ref 0.7–4.0)
MCH: 30.5 pg (ref 26.0–34.0)
MCHC: 33.5 g/dL (ref 30.0–36.0)
MCV: 91 fL (ref 80.0–100.0)
Monocytes Absolute: 0.5 10*3/uL (ref 0.1–1.0)
Monocytes Relative: 8 %
Neutro Abs: 3.7 10*3/uL (ref 1.7–7.7)
Neutrophils Relative %: 59 %
Platelets: 247 10*3/uL (ref 150–400)
RBC: 4.79 MIL/uL (ref 3.87–5.11)
RDW: 13 % (ref 11.5–15.5)
WBC: 6.3 10*3/uL (ref 4.0–10.5)
nRBC: 0 % (ref 0.0–0.2)

## 2019-11-22 LAB — BASIC METABOLIC PANEL
Anion gap: 8 (ref 5–15)
BUN: 15 mg/dL (ref 6–20)
CO2: 28 mmol/L (ref 22–32)
Calcium: 9.1 mg/dL (ref 8.9–10.3)
Chloride: 102 mmol/L (ref 98–111)
Creatinine, Ser: 0.85 mg/dL (ref 0.44–1.00)
GFR calc Af Amer: >60 mL/min (ref >60)
GFR calc non Af Amer: >60 mL/min (ref >60)
Glucose, Bld: 100 mg/dL — ABNORMAL HIGH (ref 70–99)
Potassium: 4.2 mmol/L (ref 3.5–5.1)
Sodium: 138 mmol/L (ref 135–145)

## 2019-11-22 MED ORDER — DICYCLOMINE HCL 10 MG/ML IM SOLN
20.0000 mg | Freq: Once | INTRAMUSCULAR | Status: DC
  Filled 2019-11-22 (×2): qty 2

## 2019-11-22 MED ORDER — DICYCLOMINE HCL 10 MG PO CAPS: 10.0000 mg | ORAL_CAPSULE | Freq: Four times a day (QID) | ORAL | 0 refills | Status: DC | PRN

## 2019-11-22 MED ORDER — ONDANSETRON HCL 4 MG/2ML IJ SOLN
4.0000 mg | Freq: Once | INTRAMUSCULAR | Status: AC
  Administered 2019-11-22: 4 mg via INTRAVENOUS

## 2019-11-22 MED ORDER — HALOPERIDOL LACTATE 5 MG/ML IJ SOLN
INTRAMUSCULAR | Status: AC
  Filled 2019-11-22: qty 1

## 2019-11-22 MED ORDER — LIDOCAINE 5 % EX PTCH
1.0000 | MEDICATED_PATCH | CUTANEOUS | Status: DC
  Filled 2019-11-22: qty 1

## 2019-11-22 MED ORDER — HALOPERIDOL LACTATE 5 MG/ML IJ SOLN
5.0000 mg | Freq: Once | INTRAMUSCULAR | Status: AC
  Administered 2019-11-22: 5 mg via INTRAVENOUS

## 2019-11-22 MED ORDER — ONDANSETRON HCL 4 MG/2ML IJ SOLN
INTRAMUSCULAR | Status: AC
  Filled 2019-11-22: qty 2

## 2019-11-22 NOTE — ED Notes (Signed)
ED Provider at bedside. 

## 2019-11-22 NOTE — ED Notes (Signed)
First Nurse Note: Pt to ED via POV c/ right flank pain. Pt tearful on check in but in NAD.

## 2019-11-22 NOTE — ED Notes (Signed)
Pt given sprite 

## 2019-11-22 NOTE — ED Triage Notes (Signed)
Pt seen on the 1st for same with full work up and ct. D/c with vicodin and dx flank pain with followup with gi. Pt states vicodin not working.

## 2019-11-22 NOTE — ED Provider Notes (Signed)
[facility] Emergency Department Provider Note  ____________________________________________   I have reviewed the triage vital signs and the nursing notes.   HISTORY  Chief Complaint Flank Pain   History limited by: Not Limited   HPI Lindsey Bautista is a 59 y.o. female who presents to the emergency department today because of concerns for continued right sided abdominal pain.  Patient was seen for this pain 2 days ago in the emergency department.  At that time she underwent blood work and CT scan.  This did not reveal the cause of the patient's pain.  She states she was discharged home on pain medication.  She has been trying it without any significant relief.    Records reviewed. Per medical record review patient has a history of recent ER visit for same pain. Negative CT scan and work up at that time. IBS.   Past Medical History:  Diagnosis Date  . Atypical chest pain    a. 08/2016 Neg MV.  [name] COPD (chronic obstructive pulmonary disease) ([place])   . Coronary artery disease, non-occlusive    a.  LHC 06/19/2018: Mid LAD 50% with iFR 0.94  . Depression   . Diastolic dysfunction    a. 10/2016 Echo: EF 60-65%, no rwma, Gr1 DD, mild MR, nl LA size, nl RV fxn; b. 05/2018 Echo: EF 60-65%. No rwma. Mild MR. Nl RV fxn.  [name] GERD (gastroesophageal reflux disease)   . Hypotension   . IBS (irritable bowel syndrome)   . Right arm weakness   . Situational syncope   . TIA (transient ischemic attack)     Patient Active Problem List   Diagnosis Date Noted  . Adrenal adenoma, right 09/15/2019  . Atrophic vaginitis 08/01/2019  . Advance care planning 04/16/2019  . Bacterial vaginosis 11/18/2018  . Slow transit constipation 11/18/2018  . Coronary artery disease, non-occlusive 09/02/2018  . Fibromyalgia 08/29/2017  . Conversion disorder with mixed symptoms 03/30/2016  . Aortic atherosclerosis ([place]) 12/29/2015  . Adrenal nodule ([place]) 12/29/2015  . COPD (chronic  obstructive pulmonary disease) ([place]) 12/13/2015  . Gastroesophageal reflux disease without esophagitis   . PAD (peripheral artery disease) ([place]) 10/22/2015  . Hyperlipidemia 10/22/2015  . Cervical paraspinal muscle spasm 09/30/2015  . Benzodiazepine dependence, continuous ([place]) 09/08/2015  . Situational syncope 06/07/2015  . Nicotine dependence, cigarettes, uncomplicated 99991111  . Depression, recurrent ([place]) 12/19/2013  . Anxiety 12/19/2013    Past Surgical History:  Procedure Laterality Date  . COLONOSCOPY WITH PROPOFOL N/A 11/22/2015   Procedure: COLONOSCOPY WITH PROPOFOL;  Surgeon: Manya Silvas, MD;  Location[facility] ENDOSCOPY;  Service: Endoscopy;  Laterality: N/A;  . ESOPHAGOGASTRODUODENOSCOPY (EGD) WITH PROPOFOL N/A 11/22/2015   Procedure: ESOPHAGOGASTRODUODENOSCOPY (EGD) WITH PROPOFOL;  Surgeon: Manya Silvas, MD;  Location: [facility] ENDOSCOPY;  Service: Endoscopy;  Laterality: N/A;  . INTRAVASCULAR PRESSURE WIRE/FFR STUDY N/A 06/19/2018   Procedure: INTRAVASCULAR PRESSURE WIRE/FFR STUDY;  Surgeon: Nelva Bush, MD;  Location: [place] CV LAB;  Service: Cardiovascular;  Laterality: N/A;  . LEFT HEART CATH AND CORONARY ANGIOGRAPHY N/A 06/19/2018   Procedure: LEFT HEART CATH AND CORONARY ANGIOGRAPHY;  Surgeon: Nelva Bush, MD;  Location: [place] CV LAB;  Service: Cardiovascular;  Laterality: N/A;  . OOPHORECTOMY    . TONSILLECTOMY    . VAGINAL HYSTERECTOMY      Prior to Admission medications   Medication Sig Start Date End Date Taking? Authorizing Provider  aspirin 81 MG tablet Take 81 mg by mouth daily.    [provider]  atorvastatin (LIPITOR) 80 MG tablet Take 1 tablet (80 mg total) by mouth daily at 6 PM. 02/05/19   Theora Gianotti, NP  clonazePAM (KLONOPIN) 1 MG tablet Take 1 tablet (1 mg total) by mouth 2 (two) times daily. Need visit for further refills please. 10/31/19   [name], Henrine Screws T, NP  docusate sodium (COLACE) 100 MG capsule  Take 100 mg by mouth 2 (two) times daily. Every other day    [provider]  EPINEPHrine 0.3 mg/0.3 mL IJ SOAJ injection Inject 0.3 mLs (0.3 mg total) into the muscle as needed for anaphylaxis. 05/09/19   Duffy Bruce, MD  estradiol (ESTRACE VAGINAL) 0.1 MG/GM vaginal cream One applicator of cream intravaginally nightly for 1 week, then reduce to one applicator twice weekly only. 08/01/19   [name], Henrine Screws T, NP  estradiol (ESTRACE) 0.5 MG tablet Take 1 tablet (0.5 mg total) by mouth daily. 10/31/19   [name], Henrine Screws T, NP  ezetimibe (ZETIA) 10 MG tablet Take 1 tablet (10 mg total) by mouth daily. 07/15/19 10/13/19  Rise Mu, PA-C  FLUoxetine (PROZAC) 20 MG capsule Take 1 capsule (20 mg total) by mouth daily. 10/31/19   [name], Henrine Screws T, NP  HYDROcodone-acetaminophen (NORCO/VICODIN) 5-325 MG tablet Take 1 tablet by mouth every 6 (six) hours as needed for severe pain. 11/20/19   Lavonia Drafts, MD  linaclotide (LINZESS) 72 MCG capsule TAKE 1 CAPSULE BY MOUTH ONCE DAILY BEFORE BREAKFAST 10/31/19   [name], Henrine Screws T, NP  nitroGLYCERIN (NITROSTAT) 0.4 MG SL tablet Place 1 tablet (0.4 mg total) under the tongue every 5 (five) minutes as needed for chest pain. 04/16/19   [name], MD  polyethylene glycol powder (GLYCOLAX/MIRALAX) 17 GM/SCOOP powder Take 17 g by mouth 2 (two) times daily as needed. 04/16/19   [name], MD    Allergies Flagyl [metronidazole], Penicillin g, Iodinated diagnostic agents, and Flomax [tamsulosin hcl]  Family History  Problem Relation Age of Onset  . Bladder Cancer Mother   . Heart failure Mother   . Renal Disease Mother   . COPD Father   . Heart disease Other   . COPD Other   . Stroke Other   . Breast cancer Paternal Aunt 58  . Colon cancer Brother     Social History Social History   Tobacco Use  . Smoking status: Current Every Day Smoker    Packs/day: 1.00    Years: 39.00    Pack years: 39.00    Types: Cigarettes  . Smokeless  tobacco: Never Used  Substance Use Topics  . Alcohol use: No    Alcohol/week: 0.0 standard drinks  . Drug use: No    Review of Systems Constitutional: No fever/chills Eyes: No visual changes. ENT: No sore throat. Cardiovascular: Denies chest pain. Respiratory: Denies shortness of breath. Gastrointestinal: Positive for right flank pain. Genitourinary: Negative for dysuria. Musculoskeletal: Negative for back pain. Skin: Negative for rash. Neurological: Negative for headaches, focal weakness or numbness.  ____________________________________________   PHYSICAL EXAM:  VITAL SIGNS: ED Triage Vitals  Enc Vitals Group     BP 11/22/19 1444 (!) 107/52     Pulse Rate 11/22/19 1444 69     Resp 11/22/19 1444 16     Temp 11/22/19 1444 98.4 F (36.9 C)     Temp src --      SpO2 11/22/19 1444 96 %     Weight 11/22/19 1445 146 lb (66.2 kg)     Height 11/22/19 1445 5'  1" (1.549 m)     Head Circumference --      Peak Flow --      Pain Score 11/22/19 1444 10   Constitutional: Alert and oriented.  Eyes: Conjunctivae are normal.  ENT      Head: Normocephalic and atraumatic.      Nose: No congestion/rhinnorhea.      Mouth/Throat: Mucous membranes are moist.      Neck: No stridor. Hematological/Lymphatic/Immunilogical: No cervical lymphadenopathy. Cardiovascular: Normal rate, regular rhythm.  No murmurs, rubs, or gallops. Respiratory: Normal respiratory effort without tachypnea nor retractions. Breath sounds are clear and equal bilaterally. No wheezes/rales/rhonchi. Gastrointestinal: Soft and minimally tender in the right side of the abdomen. Genitourinary: Deferred Musculoskeletal: Normal range of motion in all extremities. No lower extremity edema. Neurologic:  Normal speech and language. No gross focal neurologic deficits are appreciated.  Skin:  Skin is warm, dry and intact. No rash noted. Psychiatric: Mood and affect are normal. Speech and behavior are normal. Patient exhibits  appropriate insight and judgment.  ____________________________________________    LABS (pertinent positives/negatives)  CBC wbc 6.3, hgb 14.6, plt 247 BMP wnl except glu 100 UA hazy, protein 30, 6-10 RBC, rare bacteria, squamous epi 11-20  ____________________________________________   EKG  None  ____________________________________________    RADIOLOGY  RUQ [place] Normal gallbladder  ____________________________________________   PROCEDURES  Procedures  ____________________________________________   INITIAL IMPRESSION / ASSESSMENT AND PLAN / ED COURSE  Pertinent labs & imaging results that were available during my care of the patient were reviewed by me and considered in my medical decision making (see chart for details).   Patient presented to the emergency department today with continued right flank pain.  Patient was evaluated for this 2 days ago with negative CT scan.  Did obtain right upper quadrant ultrasound today in case patient had radio lucent gallstones.  Right upper quadrant ultrasound was negative.  Patient was given Haldol for pain control.  After receiving the although she states she felt better and comfortable going home.  I did discuss with the patient importance of follow up with the GI doctor as instructed on previous visit.   ____________________________________________   FINAL CLINICAL IMPRESSION(S) / ED DIAGNOSES  Final diagnoses:  RUQ abdominal pain  Right flank pain     Note: This dictation was prepared with Dragon dictation. Any transcriptional errors that result from this process are unintentional     Nance Pear, MD 11/22/19 1932

## 2019-11-22 NOTE — Discharge Instructions (Signed)
Please follow up with GI as previously discussed. Please seek medical attention for any high fevers, chest pain, shortness of breath, change in behavior, persistent vomiting, bloody stool or any other new or concerning symptoms.

## 2019-11-22 NOTE — ED Notes (Signed)
Pt ambulatory to bathroom at this time.

## 2019-11-24 ENCOUNTER — Telehealth: Payer: Self-pay | Admitting: Gastroenterology

## 2019-11-24 NOTE — Telephone Encounter (Signed)
Patient called the office & l/m on v/m stating she was seen in the ED & needed to make an appointment. I returned her call & l/m to call the office to schedule her appointment.

## 2019-11-25 ENCOUNTER — Telehealth: Payer: Self-pay

## 2019-11-25 ENCOUNTER — Telehealth: Payer: Self-pay | Admitting: Nurse Practitioner

## 2019-11-25 ENCOUNTER — Other Ambulatory Visit: Payer: Self-pay | Admitting: Nurse Practitioner

## 2019-11-25 DIAGNOSIS — K5901 Slow transit constipation: Secondary | ICD-10-CM

## 2019-11-25 DIAGNOSIS — R1084 Generalized abdominal pain: Secondary | ICD-10-CM

## 2019-11-25 NOTE — Telephone Encounter (Signed)
Called pt advised to her that referral has been placed pt verbalized understanding.

## 2019-11-25 NOTE — Progress Notes (Signed)
GI referral per ER.

## 2019-11-25 NOTE — Telephone Encounter (Signed)
Referral placed.

## 2019-11-25 NOTE — Telephone Encounter (Signed)
Patient left voicemail wanting to cancel

## 2019-11-25 NOTE — Telephone Encounter (Signed)
Copied from [place] [phone]. Topic: Referral - Request for Referral >> Nov 25, 2019  8:44 AM Celene Kras wrote: Has patient seen PCP for this complaint? Yes.   *If NO, is insurance requiring patient see PCP for this issue before PCP can refer them? Referral for which specialty: Gastroenterology Preferred provider/office: Dr. Lanny Hurst [place]// [facility]// [phone] Reason for referral: Pt states that she is having stomach issues and thy are getting worse. She states that she went to ED and was directed to see GI doctor ASAP. Please advise.

## 2019-11-27 NOTE — Telephone Encounter (Signed)
Thank you so much.  I will keep working on tapering off ERT too with her.  We had reduced recent visit, will continue working on this.

## 2019-11-27 NOTE — Telephone Encounter (Signed)
Spoke with pt re: neg AV and BV culture results. E. Coli on AV swab but that can be normal for postmenopausal women and culture results stated AV neg.  Pt still has a little d/c but odor improved. Has stopped scented detergent. D/C most likely normal. Pt also still on oral ERT. Stopped vag ERT. Tried to stop oral estradiol 0.5 mg daily cold [place] but had vasomotor sx. Suggested she wean down to 0.25 mg dose daily for 2 wks, then QOD, then stop. ERT may be contributing to vag d/c.  Reassurance. F/u prn.

## 2019-12-09 ENCOUNTER — Other Ambulatory Visit: Payer: Self-pay | Admitting: Nurse Practitioner

## 2019-12-09 NOTE — Telephone Encounter (Signed)
Requested Prescriptions  Pending Prescriptions Disp Refills  . estradiol (ESTRACE) 0.5 MG tablet [Pharmacy Med Name: Estradiol 0.5 MG Oral Tablet] 90 tablet 0    Sig: Take 1 tablet by mouth once daily     OB/GYN:  Estrogens Passed - 12/09/2019  9:47 AM      Passed - Mammogram is up-to-date per Health Maintenance      Passed - Last BP in normal range    BP Readings from Last 1 Encounters:  11/22/19 119/67         Passed - Valid encounter within last 12 months    Recent Outpatient Visits          1 month ago Depression, recurrent ([place])   [place], Greenhorn T, NP   2 months ago Generalized abdominal pain   Kramer, Johnson T, NP   4 months ago Atrophic vaginitis   [place], Gilman T, NP   5 months ago Bacterial vaginosis   [place], [place] T, NP   6 months ago Depression, recurrent [facility])   [place], Barbaraann Faster, NP

## 2019-12-17 ENCOUNTER — Encounter: Payer: Self-pay | Admitting: Emergency Medicine

## 2019-12-17 ENCOUNTER — Emergency Department: Payer: Medicare Other

## 2019-12-17 ENCOUNTER — Observation Stay
Admission: EM | Admit: 2019-12-17 | Discharge: 2019-12-17 | Disposition: A | Discharge status: Home / Self Care | Payer: Medicare Other | Attending: Hospitalist | Admitting: Hospitalist

## 2019-12-17 ENCOUNTER — Other Ambulatory Visit: Payer: Self-pay

## 2019-12-17 DIAGNOSIS — Z8249 Family history of ischemic heart disease and other diseases of the circulatory system: Secondary | ICD-10-CM | POA: Insufficient documentation

## 2019-12-17 DIAGNOSIS — R0789 Other chest pain: Secondary | ICD-10-CM | POA: Diagnosis not present

## 2019-12-17 DIAGNOSIS — I2510 Atherosclerotic heart disease of native coronary artery without angina pectoris: Secondary | ICD-10-CM | POA: Diagnosis not present

## 2019-12-17 DIAGNOSIS — R531 Weakness: Secondary | ICD-10-CM | POA: Diagnosis not present

## 2019-12-17 DIAGNOSIS — K589 Irritable bowel syndrome without diarrhea: Secondary | ICD-10-CM | POA: Insufficient documentation

## 2019-12-17 DIAGNOSIS — M542 Cervicalgia: Secondary | ICD-10-CM | POA: Diagnosis not present

## 2019-12-17 DIAGNOSIS — Z20822 Contact with and (suspected) exposure to covid-19: Secondary | ICD-10-CM | POA: Insufficient documentation

## 2019-12-17 DIAGNOSIS — Z79899 Other long term (current) drug therapy: Secondary | ICD-10-CM | POA: Insufficient documentation

## 2019-12-17 DIAGNOSIS — Z9114 Patient's other noncompliance with medication regimen: Secondary | ICD-10-CM | POA: Insufficient documentation

## 2019-12-17 DIAGNOSIS — J811 Chronic pulmonary edema: Secondary | ICD-10-CM | POA: Diagnosis not present

## 2019-12-17 DIAGNOSIS — I6782 Cerebral ischemia: Secondary | ICD-10-CM | POA: Diagnosis not present

## 2019-12-17 DIAGNOSIS — F17210 Nicotine dependence, cigarettes, uncomplicated: Secondary | ICD-10-CM | POA: Diagnosis not present

## 2019-12-17 DIAGNOSIS — E785 Hyperlipidemia, unspecified: Secondary | ICD-10-CM | POA: Insufficient documentation

## 2019-12-17 DIAGNOSIS — K219 Gastro-esophageal reflux disease without esophagitis: Secondary | ICD-10-CM | POA: Insufficient documentation

## 2019-12-17 DIAGNOSIS — F419 Anxiety disorder, unspecified: Secondary | ICD-10-CM | POA: Diagnosis not present

## 2019-12-17 DIAGNOSIS — I700 Atherosclerosis of aorta: Secondary | ICD-10-CM | POA: Insufficient documentation

## 2019-12-17 DIAGNOSIS — M797 Fibromyalgia: Secondary | ICD-10-CM | POA: Insufficient documentation

## 2019-12-17 DIAGNOSIS — F4521 Hypochondriasis: Secondary | ICD-10-CM | POA: Insufficient documentation

## 2019-12-17 DIAGNOSIS — Z7982 Long term (current) use of aspirin: Secondary | ICD-10-CM | POA: Diagnosis not present

## 2019-12-17 DIAGNOSIS — Z8673 Personal history of transient ischemic attack (TIA), and cerebral infarction without residual deficits: Secondary | ICD-10-CM | POA: Insufficient documentation

## 2019-12-17 DIAGNOSIS — R519 Headache, unspecified: Secondary | ICD-10-CM | POA: Diagnosis not present

## 2019-12-17 DIAGNOSIS — F329 Major depressive disorder, single episode, unspecified: Secondary | ICD-10-CM | POA: Diagnosis not present

## 2019-12-17 DIAGNOSIS — J449 Chronic obstructive pulmonary disease, unspecified: Secondary | ICD-10-CM | POA: Diagnosis not present

## 2019-12-17 DIAGNOSIS — R202 Paresthesia of skin: Secondary | ICD-10-CM | POA: Diagnosis not present

## 2019-12-17 DIAGNOSIS — R079 Chest pain, unspecified: Secondary | ICD-10-CM

## 2019-12-17 DIAGNOSIS — I739 Peripheral vascular disease, unspecified: Secondary | ICD-10-CM | POA: Insufficient documentation

## 2019-12-17 LAB — TROPONIN I (HIGH SENSITIVITY)
Troponin I (High Sensitivity): <2 ng/L (ref <18)
Troponin I (High Sensitivity): 3 ng/L (ref <18)

## 2019-12-17 LAB — BASIC METABOLIC PANEL
Anion gap: 6 (ref 5–15)
BUN: 14 mg/dL (ref 6–20)
CO2: 28 mmol/L (ref 22–32)
Calcium: 9.1 mg/dL (ref 8.9–10.3)
Chloride: 104 mmol/L (ref 98–111)
Creatinine, Ser: 0.86 mg/dL (ref 0.44–1.00)
GFR calc Af Amer: >60 mL/min (ref >60)
GFR calc non Af Amer: >60 mL/min (ref >60)
Glucose, Bld: 71 mg/dL (ref 70–99)
Potassium: 4 mmol/L (ref 3.5–5.1)
Sodium: 138 mmol/L (ref 135–145)

## 2019-12-17 LAB — URINALYSIS, COMPLETE (UACMP) WITH MICROSCOPIC
Bacteria, UA: NONE SEEN
Bilirubin Urine: NEGATIVE
Glucose, UA: NEGATIVE mg/dL
Hgb urine dipstick: NEGATIVE
Ketones, ur: NEGATIVE mg/dL
Leukocytes,Ua: NEGATIVE
Nitrite: NEGATIVE
Protein, ur: NEGATIVE mg/dL
Specific Gravity, Urine: 1.013 (ref 1.005–1.030)
pH: 5 (ref 5.0–8.0)

## 2019-12-17 LAB — RESPIRATORY PANEL BY RT PCR (FLU A&B, COVID)
Influenza A by PCR: NEGATIVE
Influenza B by PCR: NEGATIVE
SARS Coronavirus 2 by RT PCR: NEGATIVE

## 2019-12-17 LAB — CBC
HCT: 43.3 % (ref 36.0–46.0)
Hemoglobin: 14.7 g/dL (ref 12.0–15.0)
MCH: 31 pg (ref 26.0–34.0)
MCHC: 33.9 g/dL (ref 30.0–36.0)
MCV: 91.4 fL (ref 80.0–100.0)
Platelets: 273 10*3/uL (ref 150–400)
RBC: 4.74 MIL/uL (ref 3.87–5.11)
RDW: 12.7 % (ref 11.5–15.5)
WBC: 6 10*3/uL (ref 4.0–10.5)
nRBC: 0 % (ref 0.0–0.2)

## 2019-12-17 MED ORDER — SODIUM CHLORIDE 0.9% FLUSH
3.0000 mL | Freq: Once | INTRAVENOUS | Status: AC
  Administered 2019-12-17: 3 mL via INTRAVENOUS

## 2019-12-17 MED ORDER — GADOBUTROL 1 MMOL/ML IV SOLN
6.0000 mL | Freq: Once | INTRAVENOUS | Status: AC | PRN
  Administered 2019-12-17: 6 mL via INTRAVENOUS

## 2019-12-17 NOTE — ED Notes (Signed)
Pt given ascom phone to contact husband at this time

## 2019-12-17 NOTE — ED Triage Notes (Signed)
First Nurse Note:  Arrives via [facility].  Patient c/o chest pain since 0800.  Per report, NSR.  VSS.  20 RAC.  nad

## 2019-12-17 NOTE — ED Provider Notes (Signed)
[facility] Emergency Department Provider Note  ____________________________________________   First MD Initiated Contact with Patient 12/17/19 1313     (approximate)  I have reviewed the triage vital signs and the nursing notes.  History  Chief Complaint Chest Pain    HPI SURENA Bautista is a 59 y.o. female past medical history as below, including CAD, TIA who presents to the emergency department for multiple complaints.  She states over the last several days she has been feeling generally weak.  However, this morning when she woke up at 6 AM she noticed tingling on the left side of her head, as well as weakness to the right side of her body.  The symptoms are similar to prior TIAs that she has had.  Additionally, she thereafter developed some central and left-sided chest discomfort, that she describes as a pressure.  This radiates into her neck and down her left arm.  She describes the neck component as sharp and shooting.  7/10 in severity.  No apparent alleviating or aggravating components.  She does have a history of CAD.  She denies any component of chest or neck pain with regards to her prior TIAs, and states this feels different.   Past Medical Hx Past Medical History:  Diagnosis Date  . Atypical chest pain    a. 08/2016 Neg MV.  [name] COPD (chronic obstructive pulmonary disease) ([place])   . Coronary artery disease, non-occlusive    a.  LHC 06/19/2018: Mid LAD 50% with iFR 0.94  . Depression   . Diastolic dysfunction    a. 10/2016 Echo: EF 60-65%, no rwma, Gr1 DD, mild MR, nl LA size, nl RV fxn; b. 05/2018 Echo: EF 60-65%. No rwma. Mild MR. Nl RV fxn.  [name] GERD (gastroesophageal reflux disease)   . Hypotension   . IBS (irritable bowel syndrome)   . Right arm weakness   . Situational syncope   . TIA (transient ischemic attack)     Problem List Patient Active Problem List   Diagnosis Date Noted  . Adrenal adenoma, right 09/15/2019  . Atrophic vaginitis  08/01/2019  . Advance care planning 04/16/2019  . Bacterial vaginosis 11/18/2018  . Slow transit constipation 11/18/2018  . Coronary artery disease, non-occlusive 09/02/2018  . Fibromyalgia 08/29/2017  . Conversion disorder with mixed symptoms 03/30/2016  . Aortic atherosclerosis ([place]) 12/29/2015  . Adrenal nodule ([place]) 12/29/2015  . COPD (chronic obstructive pulmonary disease) ([place]) 12/13/2015  . Gastroesophageal reflux disease without esophagitis   . PAD (peripheral artery disease) ([place]) 10/22/2015  . Hyperlipidemia 10/22/2015  . Cervical paraspinal muscle spasm 09/30/2015  . Benzodiazepine dependence, continuous ([place]) 09/08/2015  . Situational syncope 06/07/2015  . Nicotine dependence, cigarettes, uncomplicated 99991111  . Depression, recurrent ([place]) 12/19/2013  . Anxiety 12/19/2013    Past Surgical Hx Past Surgical History:  Procedure Laterality Date  . COLONOSCOPY WITH PROPOFOL N/A 11/22/2015   Procedure: COLONOSCOPY WITH PROPOFOL;  Surgeon: Manya Silvas, MD;  Location: [facility] ENDOSCOPY;  Service: Endoscopy;  Laterality: N/A;  . ESOPHAGOGASTRODUODENOSCOPY (EGD) WITH PROPOFOL N/A 11/22/2015   Procedure: ESOPHAGOGASTRODUODENOSCOPY (EGD) WITH PROPOFOL;  Surgeon: Manya Silvas, MD;  Location: [facility] ENDOSCOPY;  Service: Endoscopy;  Laterality: N/A;  . INTRAVASCULAR PRESSURE WIRE/FFR STUDY N/A 06/19/2018   Procedure: INTRAVASCULAR PRESSURE WIRE/FFR STUDY;  Surgeon: Nelva Bush, MD;  Location: [place] CV LAB;  Service: Cardiovascular;  Laterality: N/A;  . LEFT HEART CATH AND CORONARY ANGIOGRAPHY N/A 06/19/2018   Procedure: LEFT HEART CATH AND CORONARY ANGIOGRAPHY;  Surgeon: Nelva Bush, MD;  Location: [place] CV LAB;  Service: Cardiovascular;  Laterality: N/A;  . OOPHORECTOMY    . TONSILLECTOMY    . VAGINAL HYSTERECTOMY      Medications Prior to Admission medications   Medication Sig Start Date End Date Taking? Authorizing Provider  aspirin 81 MG tablet  Take 81 mg by mouth daily.    [provider]  atorvastatin (LIPITOR) 80 MG tablet Take 1 tablet (80 mg total) by mouth daily at 6 PM. Patient taking differently: Take 80 mg by mouth daily.  02/05/19   Theora Gianotti, NP  clonazePAM (KLONOPIN) 1 MG tablet Take 1 tablet (1 mg total) by mouth 2 (two) times daily. Need visit for further refills please. 10/31/19   [name], Henrine Screws T, NP  dicyclomine (BENTYL) 10 MG capsule Take 1 capsule (10 mg total) by mouth 4 (four) times daily as needed for up to 14 days (abdominal pain). 11/22/19 12/06/19  Nance Pear, MD  docusate sodium (COLACE) 100 MG capsule Take 100 mg by mouth every other day.     [provider]  EPINEPHrine 0.3 mg/0.3 mL IJ SOAJ injection Inject 0.3 mLs (0.3 mg total) into the muscle as needed for anaphylaxis. 05/09/19   Duffy Bruce, MD  estradiol (ESTRACE) 0.5 MG tablet Take 1 tablet by mouth once daily 12/09/19   [name], Henrine Screws T, NP  ezetimibe (ZETIA) 10 MG tablet Take 1 tablet (10 mg total) by mouth daily. 07/15/19 11/22/19  Rise Mu, PA-C  FLUoxetine (PROZAC) 20 MG capsule Take 1 capsule (20 mg total) by mouth daily. 10/31/19   [name], Henrine Screws T, NP  HYDROcodone-acetaminophen (NORCO/VICODIN) 5-325 MG tablet Take 1 tablet by mouth every 6 (six) hours as needed for severe pain. 11/20/19   Lavonia Drafts, MD  linaclotide (LINZESS) 72 MCG capsule TAKE 1 CAPSULE BY MOUTH ONCE DAILY BEFORE BREAKFAST 10/31/19   [name], Henrine Screws T, NP  nitroGLYCERIN (NITROSTAT) 0.4 MG SL tablet Place 1 tablet (0.4 mg total) under the tongue every 5 (five) minutes as needed for chest pain. 04/16/19   [name], MD    Allergies Flagyl [metronidazole], Penicillin g, Iodinated diagnostic agents, and Flomax [tamsulosin hcl]  Family Hx Family History  Problem Relation Age of Onset  . Bladder Cancer Mother   . Heart failure Mother   . Renal Disease Mother   . COPD Father   . Heart disease Other   . COPD Other   . Stroke  Other   . Breast cancer Paternal Aunt 39  . Colon cancer Brother     Social Hx Social History   Tobacco Use  . Smoking status: Current Every Day Smoker    Packs/day: 1.00    Years: 39.00    Pack years: 39.00    Types: Cigarettes  . Smokeless tobacco: Never Used  Substance Use Topics  . Alcohol use: No    Alcohol/week: 0.0 standard drinks  . Drug use: No     Review of Systems  Constitutional: Negative for fever. Negative for chills. Eyes: Negative for visual changes. ENT: Negative for sore throat. Cardiovascular: Positive for chest pain. Respiratory: Negative for shortness of breath. Gastrointestinal: Negative for nausea. Negative for vomiting.  Genitourinary: Negative for dysuria. Musculoskeletal: Negative for leg swelling. Skin: Negative for rash. Neurological: Negative for headaches.  Positive for right-sided weakness.   Physical Exam  Vital Signs: ED Triage Vitals  Enc Vitals Group     BP 12/17/19 1207 118/67     Pulse  Rate 12/17/19 1207 63     Resp 12/17/19 1207 18     Temp 12/17/19 1207 98.3 F (36.8 C)     Temp Source 12/17/19 1207 Oral     SpO2 12/17/19 1207 100 %     Weight 12/17/19 1207 146 lb (66.2 kg)     Height 12/17/19 1207 5\' 1"  (1.549 m)     Head Circumference --      Peak Flow --      Pain Score 12/17/19 1216 7     Pain Loc --      Pain Edu? --      Excl. in [place]? --     Constitutional: Alert and oriented. Well appearing. NAD.  Head: Normocephalic. Atraumatic. Eyes: Conjunctivae clear. Sclera anicteric. Pupils equal and symmetric. Nose: No masses or lesions. No congestion or rhinorrhea. Mouth/Throat: Wearing mask.  Neck: No stridor. Trachea midline.  No audible bruits. Cardiovascular: Normal rate, regular rhythm. Extremities well perfused. Respiratory: Normal respiratory effort.  Lungs CTAB. Gastrointestinal: Soft. Non-distended. Non-tender.  Genitourinary: Deferred. Musculoskeletal: No lower extremity edema. No  deformities. Neurologic:  Normal speech and language.  No dysarthria, aphasia, or word finding difficulties.  Mild weakness of the RUE and RLE compared to the left, 4/5 versus 5/5.  Reports present, but difference in sensation right compared to left. Skin: Skin is warm, dry and intact. No rash noted. Psychiatric: Mood and affect are appropriate for situation.  EKG  Personally reviewed and interpreted by myself.   Date: 12/17/2019 Time: 1203 Rate: 64 Rhythm: Sinus Axis: Normal Intervals: Within normal limits No acute ischemic changes No STEMI    Radiology  Personally reviewed available imaging myself.   CTH  - IMPRESSION:  Slight periventricular small vessel disease. No acute infarct. No  mass or hemorrhage.   There is mild arterial vascular calcification.   CXR - IMPRESSION:  No active cardiopulmonary disease.    Procedures  Procedure(s) performed (including critical care):  Procedures   Initial Impression / Assessment and Plan / MDM / ED Course  60 y.o. female who presents to the ED for chest pain, radiating to the neck and left arm, as well as right-sided weakness.  Neurological symptoms started at 6 AM today.  Presents outside of the TPA window.  Ddx: ACS, neck or cerebral vascular dissection, TIA, CVA, complex/atypical migraine  Will plan for labs, imaging, EKG, cardiac monitoring  EKG without acute ischemic changes and initial high-sensitivity troponin is negative.  CT negative for any acute infarct.  Discussed appropriate imaging studies with radiology given her allergy to iodine, will pursue MRI brain, MRA brain, MRA neck for further evaluation of TIA/CVA as well as potential dissection.  Will admit for further stroke work-up and cardiac rule out.  Patient voices understanding and is in agreement.   _______________________________   As part of my medical decision making I have reviewed available labs, radiology tests, reviewed old records/performed chart  review.    Final Clinical Impression(s) / ED Diagnosis  Final diagnoses:  Left-sided chest pain  Right sided weakness  Neck pain       Note:  This document was prepared using Dragon voice recognition software and may include unintentional dictation errors.   Lilia Pro., MD 12/17/19 [phone]

## 2019-12-17 NOTE — ED Triage Notes (Signed)
Pt presents to ED via [facility] with c/o L chest pain that radiates to L side of her neck, pt also c/o tingling to L arm. Pt reports started this morning. Pt also reports tingling to L side of her head. Pt reports this morning had rapid HR, took SL nitro prior to arrival. Upon arrival to ED VSS, pt noted to be tearful.   20G to L AC initiated by EMS.

## 2019-12-17 NOTE — ED Notes (Signed)
20G, Right AC removed. Catheter intact and bleeding controlled. Removed for pts discharge.

## 2019-12-17 NOTE — H&P (Addendum)
History and Physical    Lindsey Bautista [id number] DOB: [birth year] DOA: 12/17/2019  PCP: Venita Lick, NP  Patient coming from: home  I have personally briefly reviewed patient's old medical records in [place]  Chief Complaint: left chest pain, left neck pain, right-sided weakness.   HPI: Lindsey Bautista is a 59 y.o. Caucasian female with medical history significant of medication noncompliance, anxiety and depression, conversion disorder, CAD, long smoking history who presented with multiple symptoms.   At triage, pt complained of left-sided chest pain radiating to left side of her neck, and also right-sided weakness.  During my interview, pt mostly complained of pain on the left side of her neck, which she said had been present for several weeks.  Pt could not specify what weakness she had on her right side.  No fever, dyspnea, abdominal pain, N/V/D, dysuria.  Pt said that her mother and uncle died of heart attacks, carotid stenosis, and she was worried about having the same conditions.    Of note, pt has had multiple cardiac stress in the past, last neg on 07/15/19, and MR of brain at least once per year.  Pt had a CT soft tissue neck study on 06/06/19 for the same indication of left-sided neck pain for 3 weeks, which showed no acute finding.     ED Course: initial vitals: afebrile, pulse 63, BP 118/67, sating 100% on room air.  Labs all wnl, trop neg x2, CT head no acute finding.  ED provider ordered MRA head and neck and MR brain and requested admission for observation.   Assessment/Plan Active Problems:   Left-sided chest pain  # Left-sided chest pain, ACS ruled out --trop neg, EKG no ACS-related changes.  Pt had a neg cardiac stress test on 07/15/19.  # Left-sided neck pain --due to pt complaining of right-sided weakness, ED physician ordered MRA head and neck and MR brain to assess for dissection.   --CT soft tissue neck study on 06/06/19 for the same showed no  acute finding.   PLAN: --follow up on results of MR studies  # Hypochondriasis  # Anxiety and depression # Hx of conversion disorder --Pt was very worried about having the same medical conditions that caused her mother and uncle's demise.   --continue home Klonopin and Prozac    DVT prophylaxis: Lovenox SQ Code Status: Full code  Family Communication:   Disposition Plan: home  Consults called:  Admission status: Observation   Review of Systems: As per HPI otherwise 10 point review of systems negative.   Past Medical History:  Diagnosis Date  . Atypical chest pain    a. 08/2016 Neg MV.  [name] COPD (chronic obstructive pulmonary disease) ([place])   . Coronary artery disease, non-occlusive    a.  LHC 06/19/2018: Mid LAD 50% with iFR 0.94  . Depression   . Diastolic dysfunction    a. 10/2016 Echo: EF 60-65%, no rwma, Gr1 DD, mild MR, nl LA size, nl RV fxn; b. 05/2018 Echo: EF 60-65%. No rwma. Mild MR. Nl RV fxn.  [name] GERD (gastroesophageal reflux disease)   . Hypotension   . IBS (irritable bowel syndrome)   . Right arm weakness   . Situational syncope   . TIA (transient ischemic attack)     Past Surgical History:  Procedure Laterality Date  . COLONOSCOPY WITH PROPOFOL N/A 11/22/2015   Procedure: COLONOSCOPY WITH PROPOFOL;  Surgeon: Manya Silvas, MD;  Location: [facility] ENDOSCOPY;  Service: Endoscopy;  Laterality:  N/A;  . ESOPHAGOGASTRODUODENOSCOPY (EGD) WITH PROPOFOL N/A 11/22/2015   Procedure: ESOPHAGOGASTRODUODENOSCOPY (EGD) WITH PROPOFOL;  Surgeon: Manya Silvas, MD;  Location: [facility] ENDOSCOPY;  Service: Endoscopy;  Laterality: N/A;  . INTRAVASCULAR PRESSURE WIRE/FFR STUDY N/A 06/19/2018   Procedure: INTRAVASCULAR PRESSURE WIRE/FFR STUDY;  Surgeon: Nelva Bush, MD;  Location: [place] CV LAB;  Service: Cardiovascular;  Laterality: N/A;  . LEFT HEART CATH AND CORONARY ANGIOGRAPHY N/A 06/19/2018   Procedure: LEFT HEART CATH AND CORONARY ANGIOGRAPHY;  Surgeon: Nelva Bush, MD;  Location: [place] CV LAB;  Service: Cardiovascular;  Laterality: N/A;  . OOPHORECTOMY    . TONSILLECTOMY    . VAGINAL HYSTERECTOMY       reports that she has been smoking cigarettes. She has a 39.00 pack-year smoking history. She has never used smokeless tobacco. She reports that she does not drink alcohol or use drugs.  Allergies  Allergen Reactions  . Flagyl [Metronidazole] Anaphylaxis  . Penicillin G Anaphylaxis and Other (See Comments)    Has patient had a PCN reaction causing immediate rash, facial/tongue/throat swelling, SOB or lightheadedness with hypotension: [id number] Has patient had a PCN reaction causing severe rash involving mucus membranes or skin necrosis: no:[id number]} Has patient had a PCN reaction that required hospitalization no:[id number]} Has patient had a PCN reaction occurring within the last 10 years: [id number] If all of the above answers are "NO", then may proceed with Cephalosporin use.   . Iodinated Diagnostic Agents Rash and Nausea And Vomiting  . Flomax [Tamsulosin Hcl] Hives    Per ER visit 03/11/19    Family History  Problem Relation Age of Onset  . Bladder Cancer Mother   . Heart failure Mother   . Renal Disease Mother   . COPD Father   . Heart disease Other   . COPD Other   . Stroke Other   . Breast cancer Paternal Aunt 52  . Colon cancer Brother     Prior to Admission medications   Medication Sig Start Date End Date Taking? Authorizing Provider  aspirin 81 MG tablet Take 81 mg by mouth daily.    [provider]  atorvastatin (LIPITOR) 80 MG tablet Take 1 tablet (80 mg total) by mouth daily at 6 PM. Patient taking differently: Take 80 mg by mouth daily.  02/05/19   Theora Gianotti, NP  clonazePAM (KLONOPIN) 1 MG tablet Take 1 tablet (1 mg total) by mouth 2 (two) times daily. Need visit for further refills please. 10/31/19   [name], Henrine Screws T, NP  dicyclomine (BENTYL) 10 MG capsule Take 1 capsule (10  mg total) by mouth 4 (four) times daily as needed for up to 14 days (abdominal pain). 11/22/19 12/06/19  Nance Pear, MD  docusate sodium (COLACE) 100 MG capsule Take 100 mg by mouth every other day.     [provider]  EPINEPHrine 0.3 mg/0.3 mL IJ SOAJ injection Inject 0.3 mLs (0.3 mg total) into the muscle as needed for anaphylaxis. 05/09/19   Duffy Bruce, MD  estradiol (ESTRACE) 0.5 MG tablet Take 1 tablet by mouth once daily 12/09/19   [name], Henrine Screws T, NP  ezetimibe (ZETIA) 10 MG tablet Take 1 tablet (10 mg total) by mouth daily. 07/15/19 11/22/19  Rise Mu, PA-C  FLUoxetine (PROZAC) 20 MG capsule Take 1 capsule (20 mg total) by mouth daily. 10/31/19   [name], Henrine Screws T, NP  HYDROcodone-acetaminophen (NORCO/VICODIN) 5-325 MG tablet Take 1 tablet by mouth every 6 (six) hours as needed for severe pain. 11/20/19  Lavonia Drafts, MD  linaclotide [facility]) 72 MCG capsule TAKE 1 CAPSULE BY MOUTH ONCE DAILY BEFORE BREAKFAST 10/31/19   [name], Henrine Screws T, NP  nitroGLYCERIN (NITROSTAT) 0.4 MG SL tablet Place 1 tablet (0.4 mg total) under the tongue every 5 (five) minutes as needed for chest pain. 04/16/19   [name], MD    Physical Exam: Vitals:   12/17/19 1700 12/17/19 1715 12/17/19 1730 12/17/19 1800  BP: 121/72  126/70 125/70  Pulse:  (!) 54 (!) 58 63  Resp: 11 20 17 12   Temp:      TempSrc:      SpO2: (!) 86% 97% 96% 99%  Weight:      Height:        Constitutional: NAD, AAOx3 HEENT: conjunctivae and lids normal, EOMI CV: RRR no M,R,G. Distal pulses +2.  No cyanosis.   RESP: CTA B/L, normal respiratory effort  GI: +BS, NTND Extremities: No effusions, edema, or tenderness in BLE SKIN: warm, dry and intact Neuro: II - XII grossly intact.  Sensation intact Psych: Labile mood and affect.  Intermittently teary.   Labs on Admission: I have personally reviewed following labs and imaging studies  CBC: Recent Labs  Lab 12/17/19 1208  WBC 6.0  HGB 14.7  HCT 43.3    MCV 91.4  PLT 123456   Basic Metabolic Panel: Recent Labs  Lab 12/17/19 1208  NA 138  K 4.0  CL 104  CO2 28  GLUCOSE 71  BUN 14  CREATININE 0.86  CALCIUM 9.1   GFR: Estimated Creatinine Clearance: 62.1 mL/min (by C-G formula based on SCr of 0.86 mg/dL). Liver Function Tests: No results for input(s): AST, ALT, ALKPHOS, BILITOT, PROT, ALBUMIN in the last 168 hours. No results for input(s): LIPASE, AMYLASE in the last 168 hours. No results for input(s): AMMONIA in the last 168 hours. Coagulation Profile: No results for input(s): INR, PROTIME in the last 168 hours. Cardiac Enzymes: No results for input(s): CKTOTAL, CKMB, CKMBINDEX, TROPONINI in the last 168 hours. BNP (last 3 results) No results for input(s): PROBNP in the last 8760 hours. HbA1C: No results for input(s): HGBA1C in the last 72 hours. CBG: No results for input(s): GLUCAP in the last 168 hours. Lipid Profile: No results for input(s): CHOL, HDL, LDLCALC, TRIG, CHOLHDL, LDLDIRECT in the last 72 hours. Thyroid Function Tests: No results for input(s): TSH, T4TOTAL, FREET4, T3FREE, THYROIDAB in the last 72 hours. Anemia Panel: No results for input(s): VITAMINB12, FOLATE, FERRITIN, TIBC, IRON, RETICCTPCT in the last 72 hours. Urine analysis:    Component Value Date/Time   COLORURINE YELLOW (A) 12/17/2019 1358   APPEARANCEUR CLEAR (A) 12/17/2019 1358   APPEARANCEUR Hazy (A) 10/31/2019 1342   LABSPEC 1.013 12/17/2019 1358   LABSPEC 1.005 04/27/2014 1404   PHURINE 5.0 12/17/2019 1358   GLUCOSEU NEGATIVE 12/17/2019 1358   GLUCOSEU Negative 04/27/2014 1404   HGBUR NEGATIVE 12/17/2019 1358   BILIRUBINUR NEGATIVE 12/17/2019 1358   BILIRUBINUR Negative 10/31/2019 1342   BILIRUBINUR Negative 04/27/2014 1404   KETONESUR NEGATIVE 12/17/2019 1358   PROTEINUR NEGATIVE 12/17/2019 1358   NITRITE NEGATIVE 12/17/2019 1358   LEUKOCYTESUR NEGATIVE 12/17/2019 1358   LEUKOCYTESUR Negative 04/27/2014 1404    Radiological  Exams on Admission: DG Chest 2 View  Result Date: 12/17/2019 CLINICAL DATA:  Acute chest pain. EXAM: CHEST - 2 VIEW COMPARISON:  06/06/2019 FINDINGS: The cardiomediastinal silhouette is unremarkable. There is no evidence of focal airspace disease, pulmonary edema, suspicious pulmonary nodule/mass, pleural effusion, or pneumothorax. No  acute bony abnormalities are identified. IMPRESSION: No active cardiopulmonary disease. Electronically Signed   By: Margarette Canada M.D.   On: 12/17/2019 13:47   CT Head Wo Contrast  Result Date: 12/17/2019 CLINICAL DATA:  Headache with left upper extremity tingling sensation EXAM: CT HEAD WITHOUT CONTRAST TECHNIQUE: Contiguous axial images were obtained from the base of the skull through the vertex without intravenous contrast. COMPARISON:  May 25, 2019 FINDINGS: Brain: The ventricles and sulci are normal in size and configuration. There is no intracranial mass, hemorrhage, extra-axial fluid collection, or midline shift. There is slight small vessel disease in the centra semiovale anteriorly. Elsewhere brain parenchyma appears unremarkable. No acute infarct evident. Vascular: No hyperdense vessel. There is mild calcification in each carotid siphon region. Skull: Bony calvarium appears intact. Sinuses/Orbits: Visualized paranasal sinuses are clear. Visualized orbits appear symmetric bilaterally. Other: Mastoid air cells are clear. IMPRESSION: Slight periventricular small vessel disease. No acute infarct. No mass or hemorrhage. There is mild arterial vascular calcification. Electronically Signed   By: Lowella Grip III M.D.   On: 12/17/2019 14:46   MR ANGIO HEAD WO CONTRAST  Result Date: 12/17/2019 CLINICAL DATA:  59 year old female with headache and left upper extremity tingling. History of prior TIA. EXAM: MRA HEAD WITHOUT CONTRAST TECHNIQUE: Angiographic images of the Circle of Willis were obtained using MRA technique without intravenous contrast. COMPARISON:  Brain MRI  and neck MRA today reported separately. Intracranial MRA 11/17/2016. FINDINGS: Dominant appearing distal right vertebral artery as before, with no right V4 stenosis. Normal right PICA origin. The right vertebral supplies the basilar. A diminutive left PICA or V4 segment arises from the basilar on series 1, image 52 and is stable since 2018. Also, the left AICA appears dominant as before, and patent. Patent basilar artery without stenosis. Patent SCA and normal right PCA origins. Fetal type left PCA origin (normal variant) with diminutive or absent right posterior communicating artery as before. Bilateral PCA branches are stable and within normal limits. Antegrade flow in both ICA siphons. No siphon stenosis. Ophthalmic and left posterior communicating artery origins are normal. Patent carotid termini. Normal MCA and ACA origins. Anterior communicating artery and visible ACA branches are stable and normal. MCA M1 segments and MCA bifurcations remain patent without stenosis. Visible bilateral MCA branches are stable and within normal limits. IMPRESSION: Stable and negative intracranial MRA, with a dominant right vertebral artery supplying the basilar and a fetal type left PCA origin. Electronically Signed   By: Genevie Ann M.D.   On: 12/17/2019 16:56   MR Angiogram Neck W or Wo Contrast  Result Date: 12/17/2019 CLINICAL DATA:  59 year old female with headache and left upper extremity tingling. History of prior TIA. EXAM: MRA NECK WITHOUT AND WITH CONTRAST TECHNIQUE: Multiplanar and multiecho pulse sequences of the neck were obtained without and with intravenous contrast. Angiographic images of the neck were obtained using MRA technique without and with intravenous contrast. CONTRAST:  4mL GADAVIST GADOBUTROL 1 MMOL/ML IV SOLN COMPARISON:  Brain MRI today reported separately. Carotid Doppler ultrasound 06/13/2018. Neck CT without contrast 06/06/2019. FINDINGS: Precontrast time-of-flight images demonstrate antegrade  flow in both cervical carotid arteries and a dominant right vertebral artery. There is minimal antegrade flow signal evident in the diminutive left vertebral artery. Postcontrast neck MRA images reveal a 3 vessel arch configuration. Normal great vessel origins. Right CCA, right carotid bifurcation and cervical right ICA appear normal. Left CCA, left carotid bifurcation and cervical left ICA appear normal. Both proximal subclavian arteries appear normal. The  right vertebral artery origin is normal. The right vertebral artery is dominant, and patent to the skull base with no stenosis or abnormality identified. The left vertebral artery appears non dominant and highly diminutive, not visible postcontrast until only faintly so in the left V3 segment. Comparing to the cervical vertebrae on the prior neck CT, the left cervical transverse foramen is smaller than the right although initially a normal size in the mid to lower cervical spine. However, the left cervical foramen is highly diminutive at the C2 level. Additionally, the visible anterior and posterior circulation appears grossly normal. There is a fetal type left PCA origin. IMPRESSION: 1. Essentially absent flow in the cervical left vertebral artery, which has not been visible on intracranial MRA since 2017, and not visible at the skull base by MRI since 2014. This could be a congenitally non-dominant (favored) or chronically thrombosed vessel. 2. Otherwise negative neck MRA. Dominant right vertebral artery without stenosis. Electronically Signed   By: Genevie Ann M.D.   On: 12/17/2019 16:52   MR Brain Wo Contrast (neuro protocol)  Result Date: 12/17/2019 CLINICAL DATA:  59 year old female with headache and left upper extremity tingling. History of prior TIA. EXAM: MRI HEAD WITHOUT CONTRAST TECHNIQUE: Multiplanar, multiecho pulse sequences of the brain and surrounding structures were obtained without intravenous contrast. COMPARISON:  Head CT without contrast  earlier today. Brain MRI 06/13/2018, intracranial MRA 11/17/2016, and earlier. FINDINGS: Brain: No restricted diffusion to suggest acute infarction. No midline shift, mass effect, evidence of mass lesion, ventriculomegaly, extra-axial collection or acute intracranial hemorrhage. Cervicomedullary junction and pituitary are within normal limits. Stable gray and white matter signal throughout the brain. Minimal/punctate occasional nonspecific cerebral white matter T2 and FLAIR hyperintensity appears stable since 2019. No cortical encephalomalacia or chronic cerebral blood products identified. The deep gray nuclei, brainstem and cerebellum remain normal. Vascular: Major intracranial vascular flow voids are stable since 2019, the right vertebral artery appears dominant as before. Skull and upper cervical spine: Negative visible cervical spine and bone marrow signal. Sinuses/Orbits: Stable and negative. Other: Mastoids remain clear. Visible internal auditory structures appear stable and negative. IMPRESSION: 1. No acute intracranial abnormality. 2. Continued normal for age noncontrast MRI appearance of the brain. Electronically Signed   By: Genevie Ann M.D.   On: 12/17/2019 16:57      Enzo Bi MD [facility]  If 7PM-7AM, please contact night-coverage 12/18/2019, 4:34 PM

## 2019-12-17 NOTE — Discharge Summary (Signed)
Physician Discharge Summary   Lindsey Bautista  female DOB: [birth year]  [id number]  PCP: Venita Lick, NP  Admit date: 12/17/2019 Discharge date: 12/17/2019  Admitted From: home Disposition:  home CODE STATUS: Full code  Discharge Instructions    Discharge instructions   Complete by: As directed    MRI of your brain showed no stroke and no blockage in the arteries that feed your brain.  MRI of your neck showed no blockage in your carotid arteries on both sides.  You also had a CT of the neck on 06/06/2019 that was normal.    The pain in your neck is likely muscle strain.  Try some some neck exercises or heat pad to the area to relax the muscles.  [facility] Course:  For full details, please see H&P, progress notes, consult notes and ancillary notes.  Briefly,  Lindsey Bautista is a 59 y.o. Caucasian female with medical history significant of medication noncompliance, anxiety and depression, conversion disorder, CAD, long smoking history who presented with multiple symptoms which included left chest pain, left neck pain, right-sided weakness.  # Left-sided chest pain, ACS ruled out Trop neg, EKG no ACS-related changes.  Pt had a neg cardiac stress test on 07/15/19.  # Left-sided neck pain, MSK related Due to pt complaining of right-sided weakness, ED physician ordered MRA head and neck and MR brain to assess for dissection, which all returned wnl, specifically, no carotid stenosis on both sides (which was what pt was concerned about).  Pt had a CT soft tissue neck study on 06/06/19 for the same showed no acute finding, so this pain is chronic and likely related to muscular, tendon issues.  Recommend supportive treatment with neck range-of-motion exercises and heat pad.  # Hypochondriasis  # Anxiety and depression # Hx of conversion disorder Pt was very worried about having the same medical conditions that caused her mother and uncle's demise.  Pt has had  yearly cardiac stress test and MR brain.  Pt takes Klonopin and Prozac at home, and would benefit from outpatient counseling, psych eval and frequent PCP followups.     Discharge Diagnoses:  Active Problems:   Left-sided chest pain    Discharge Instructions:  Allergies as of 12/17/2019      Reactions   Flagyl [metronidazole] Anaphylaxis   Penicillin G Anaphylaxis, Other (See Comments)   Has patient had a PCN reaction causing immediate rash, facial/tongue/throat swelling, SOB or lightheadedness with hypotension: [id number] Has patient had a PCN reaction causing severe rash involving mucus membranes or skin necrosis: no:[id number]} Has patient had a PCN reaction that required hospitalization no:[id number]} Has patient had a PCN reaction occurring within the last 10 years: [id number] If all of the above answers are "NO", then may proceed with Cephalosporin use.   Iodinated Diagnostic Agents Rash, Nausea And Vomiting   Flomax [tamsulosin Hcl] Hives   Per ER visit 03/11/19      Medication List    TAKE these medications   aspirin 81 MG tablet Take 81 mg by mouth daily.   atorvastatin 80 MG tablet Commonly known as: LIPITOR Take 1 tablet (80 mg total) by mouth daily at 6 PM. What changed: when to take this   clonazePAM 1 MG tablet Commonly known as: KLONOPIN Take 1 tablet (1 mg total) by mouth 2 (two) times daily. Need visit for further refills please.   dicyclomine 10 MG capsule Commonly known as: Bentyl Take 1  capsule (10 mg total) by mouth 4 (four) times daily as needed for up to 14 days (abdominal pain).   docusate sodium 100 MG capsule Commonly known as: COLACE Take 100 mg by mouth every other day.   EPINEPHrine 0.3 mg/0.3 mL Soaj injection Commonly known as: EPI-PEN Inject 0.3 mLs (0.3 mg total) into the muscle as needed for anaphylaxis.   estradiol 0.5 MG tablet Commonly known as: ESTRACE Take 1 tablet by mouth once daily   ezetimibe 10 MG tablet Commonly  known as: ZETIA Take 1 tablet (10 mg total) by mouth daily.   FLUoxetine 20 MG capsule Commonly known as: PROZAC Take 1 capsule (20 mg total) by mouth daily.   HYDROcodone-acetaminophen 5-325 MG tablet Commonly known as: NORCO/VICODIN Take 1 tablet by mouth every 6 (six) hours as needed for severe pain.   linaclotide 72 MCG capsule Commonly known as: Linzess TAKE 1 CAPSULE BY MOUTH ONCE DAILY BEFORE BREAKFAST   nitroGLYCERIN 0.4 MG SL tablet Commonly known as: NITROSTAT Place 1 tablet (0.4 mg total) under the tongue every 5 (five) minutes as needed for chest pain.       Follow-up Information    Venita Lick, NP. Schedule an appointment as soon as possible for a visit in 1 week(s).   Specialty: Nurse Practitioner Contact information: [place] [address] [phone]        Minna Merritts, MD .   Specialty: Cardiology Contact information: [address]  [place] [address] [phone]           Allergies  Allergen Reactions  . Flagyl [Metronidazole] Anaphylaxis  . Penicillin G Anaphylaxis and Other (See Comments)    Has patient had a PCN reaction causing immediate rash, facial/tongue/throat swelling, SOB or lightheadedness with hypotension: [id number] Has patient had a PCN reaction causing severe rash involving mucus membranes or skin necrosis: no:[id number]} Has patient had a PCN reaction that required hospitalization no:[id number]} Has patient had a PCN reaction occurring within the last 10 years: [id number] If all of the above answers are "NO", then may proceed with Cephalosporin use.   . Iodinated Diagnostic Agents Rash and Nausea And Vomiting  . Flomax [Tamsulosin Hcl] Hives    Per ER visit 03/11/19     The results of significant diagnostics from this hospitalization (including imaging, microbiology, ancillary and laboratory) are listed below for reference.   Consultations:   Procedures/Studies: DG Chest 2  View  Result Date: 12/17/2019 CLINICAL DATA:  Acute chest pain. EXAM: CHEST - 2 VIEW COMPARISON:  06/06/2019 FINDINGS: The cardiomediastinal silhouette is unremarkable. There is no evidence of focal airspace disease, pulmonary edema, suspicious pulmonary nodule/mass, pleural effusion, or pneumothorax. No acute bony abnormalities are identified. IMPRESSION: No active cardiopulmonary disease. Electronically Signed   By: Margarette Canada M.D.   On: 12/17/2019 13:47   CT Head Wo Contrast  Result Date: 12/17/2019 CLINICAL DATA:  Headache with left upper extremity tingling sensation EXAM: CT HEAD WITHOUT CONTRAST TECHNIQUE: Contiguous axial images were obtained from the base of the skull through the vertex without intravenous contrast. COMPARISON:  May 25, 2019 FINDINGS: Brain: The ventricles and sulci are normal in size and configuration. There is no intracranial mass, hemorrhage, extra-axial fluid collection, or midline shift. There is slight small vessel disease in the centra semiovale anteriorly. Elsewhere brain parenchyma appears unremarkable. No acute infarct evident. Vascular: No hyperdense vessel. There is mild calcification in each carotid siphon region. Skull: Bony calvarium appears intact. Sinuses/Orbits: Visualized paranasal  sinuses are clear. Visualized orbits appear symmetric bilaterally. Other: Mastoid air cells are clear. IMPRESSION: Slight periventricular small vessel disease. No acute infarct. No mass or hemorrhage. There is mild arterial vascular calcification. Electronically Signed   By: Lowella Grip III M.D.   On: 12/17/2019 14:46   MR ANGIO HEAD WO CONTRAST  Result Date: 12/17/2019 CLINICAL DATA:  59 year old female with headache and left upper extremity tingling. History of prior TIA. EXAM: MRA HEAD WITHOUT CONTRAST TECHNIQUE: Angiographic images of the Circle of Willis were obtained using MRA technique without intravenous contrast. COMPARISON:  Brain MRI and neck MRA today reported  separately. Intracranial MRA 11/17/2016. FINDINGS: Dominant appearing distal right vertebral artery as before, with no right V4 stenosis. Normal right PICA origin. The right vertebral supplies the basilar. A diminutive left PICA or V4 segment arises from the basilar on series 1, image 52 and is stable since 2018. Also, the left AICA appears dominant as before, and patent. Patent basilar artery without stenosis. Patent SCA and normal right PCA origins. Fetal type left PCA origin (normal variant) with diminutive or absent right posterior communicating artery as before. Bilateral PCA branches are stable and within normal limits. Antegrade flow in both ICA siphons. No siphon stenosis. Ophthalmic and left posterior communicating artery origins are normal. Patent carotid termini. Normal MCA and ACA origins. Anterior communicating artery and visible ACA branches are stable and normal. MCA M1 segments and MCA bifurcations remain patent without stenosis. Visible bilateral MCA branches are stable and within normal limits. IMPRESSION: Stable and negative intracranial MRA, with a dominant right vertebral artery supplying the basilar and a fetal type left PCA origin. Electronically Signed   By: Genevie Ann M.D.   On: 12/17/2019 16:56   MR Angiogram Neck W or Wo Contrast  Result Date: 12/17/2019 CLINICAL DATA:  59 year old female with headache and left upper extremity tingling. History of prior TIA. EXAM: MRA NECK WITHOUT AND WITH CONTRAST TECHNIQUE: Multiplanar and multiecho pulse sequences of the neck were obtained without and with intravenous contrast. Angiographic images of the neck were obtained using MRA technique without and with intravenous contrast. CONTRAST:  74mL GADAVIST GADOBUTROL 1 MMOL/ML IV SOLN COMPARISON:  Brain MRI today reported separately. Carotid Doppler ultrasound 06/13/2018. Neck CT without contrast 06/06/2019. FINDINGS: Precontrast time-of-flight images demonstrate antegrade flow in both cervical carotid  arteries and a dominant right vertebral artery. There is minimal antegrade flow signal evident in the diminutive left vertebral artery. Postcontrast neck MRA images reveal a 3 vessel arch configuration. Normal great vessel origins. Right CCA, right carotid bifurcation and cervical right ICA appear normal. Left CCA, left carotid bifurcation and cervical left ICA appear normal. Both proximal subclavian arteries appear normal. The right vertebral artery origin is normal. The right vertebral artery is dominant, and patent to the skull base with no stenosis or abnormality identified. The left vertebral artery appears non dominant and highly diminutive, not visible postcontrast until only faintly so in the left V3 segment. Comparing to the cervical vertebrae on the prior neck CT, the left cervical transverse foramen is smaller than the right although initially a normal size in the mid to lower cervical spine. However, the left cervical foramen is highly diminutive at the C2 level. Additionally, the visible anterior and posterior circulation appears grossly normal. There is a fetal type left PCA origin. IMPRESSION: 1. Essentially absent flow in the cervical left vertebral artery, which has not been visible on intracranial MRA since 2017, and not visible at the skull base by  MRI since 2014. This could be a congenitally non-dominant (favored) or chronically thrombosed vessel. 2. Otherwise negative neck MRA. Dominant right vertebral artery without stenosis. Electronically Signed   By: Genevie Ann M.D.   On: 12/17/2019 16:52   MR Brain Wo Contrast (neuro protocol)  Result Date: 12/17/2019 CLINICAL DATA:  59 year old female with headache and left upper extremity tingling. History of prior TIA. EXAM: MRI HEAD WITHOUT CONTRAST TECHNIQUE: Multiplanar, multiecho pulse sequences of the brain and surrounding structures were obtained without intravenous contrast. COMPARISON:  Head CT without contrast earlier today. Brain MRI  06/13/2018, intracranial MRA 11/17/2016, and earlier. FINDINGS: Brain: No restricted diffusion to suggest acute infarction. No midline shift, mass effect, evidence of mass lesion, ventriculomegaly, extra-axial collection or acute intracranial hemorrhage. Cervicomedullary junction and pituitary are within normal limits. Stable gray and white matter signal throughout the brain. Minimal/punctate occasional nonspecific cerebral white matter T2 and FLAIR hyperintensity appears stable since 2019. No cortical encephalomalacia or chronic cerebral blood products identified. The deep gray nuclei, brainstem and cerebellum remain normal. Vascular: Major intracranial vascular flow voids are stable since 2019, the right vertebral artery appears dominant as before. Skull and upper cervical spine: Negative visible cervical spine and bone marrow signal. Sinuses/Orbits: Stable and negative. Other: Mastoids remain clear. Visible internal auditory structures appear stable and negative. IMPRESSION: 1. No acute intracranial abnormality. 2. Continued normal for age noncontrast MRI appearance of the brain. Electronically Signed   By: Genevie Ann M.D.   On: 12/17/2019 16:57   CT RENAL STONE STUDY  Result Date: 11/20/2019 CLINICAL DATA:  Lower abdominal and back pain EXAM: CT ABDOMEN AND PELVIS WITHOUT CONTRAST TECHNIQUE: Multidetector CT imaging of the abdomen and pelvis was performed following the standard protocol without IV contrast. COMPARISON:  CT 09/26/2019 FINDINGS: Lower chest: Lung bases demonstrate no acute consolidation or effusion. The heart size is within normal limits. Hepatobiliary: No focal liver abnormality is seen. No gallstones, gallbladder wall thickening, or biliary dilatation. Possible sludge in the gallbladder. Pancreas: Unremarkable. No pancreatic ductal dilatation or surrounding inflammatory changes. Spleen: Normal in size without focal abnormality. Adrenals/Urinary Tract: Left adrenal gland is normal. 16 mm right  adrenal gland nodule without change, consistent with adenoma. Negative for hydronephrosis or ureteral stone. The bladder is unremarkable Stomach/Bowel: Stomach is within normal limits. Appendix appendix not well seen but no right lower quadrant inflammatory process. No evidence of bowel wall thickening, distention, or inflammatory changes. Vascular/Lymphatic: Moderate aortic atherosclerosis. No aneurysm. No suspicious adenopathy Reproductive: Status post hysterectomy. No adnexal masses. Other: Negative for free air or free fluid Musculoskeletal: No acute or suspicious osseous abnormality. IMPRESSION: 1. Negative for hydronephrosis or ureteral stone. 2. No CT evidence for acute intra-abdominal or pelvic abnormality 3. 16 mm right adrenal gland nodule without change consistent with adenoma Electronically Signed   By: Donavan Foil M.D.   On: 11/20/2019 16:34   US Abdomen Limited RUQ  Result Date: 11/22/2019 CLINICAL DATA:  Right upper quadrant pain.  Nausea and vomiting. EXAM: ULTRASOUND ABDOMEN LIMITED RIGHT UPPER QUADRANT COMPARISON:  CT 11/20/2019. FINDINGS: Gallbladder: Physiologically distended. Gallstones or wall thickening visualized. No sonographic Murphy sign noted by sonographer. Common bile duct: Diameter: 2 mm, normal. Liver: No focal lesion identified. Mildly increased in parenchymal echogenicity compared to right kidney. Portal vein is patent on color Doppler imaging with normal direction of blood flow towards the liver. Other: None. IMPRESSION: Normal sonographic appearance of gallbladder and biliary tree. Electronically Signed   By: Keith Rake M.D.   On:  11/22/2019 18:05      Labs: BNP (last 3 results) Recent Labs    06/06/19 0638  BNP [id number]   Basic Metabolic Panel: Recent Labs  Lab 12/17/19 1208  NA 138  K 4.0  CL 104  CO2 28  GLUCOSE 71  BUN 14  CREATININE 0.86  CALCIUM 9.1   Liver Function Tests: No results for input(s): AST, ALT, ALKPHOS, BILITOT, PROT, ALBUMIN in  the last 168 hours. No results for input(s): LIPASE, AMYLASE in the last 168 hours. No results for input(s): AMMONIA in the last 168 hours. CBC: Recent Labs  Lab 12/17/19 1208  WBC 6.0  HGB 14.7  HCT 43.3  MCV 91.4  PLT 273   Cardiac Enzymes: No results for input(s): CKTOTAL, CKMB, CKMBINDEX, TROPONINI in the last 168 hours. BNP: Invalid input(s): POCBNP CBG: No results for input(s): GLUCAP in the last 168 hours. D-Dimer No results for input(s): DDIMER in the last 72 hours. Hgb A1c No results for input(s): HGBA1C in the last 72 hours. Lipid Profile No results for input(s): CHOL, HDL, LDLCALC, TRIG, CHOLHDL, LDLDIRECT in the last 72 hours. Thyroid function studies No results for input(s): TSH, T4TOTAL, T3FREE, THYROIDAB in the last 72 hours.  Invalid input(s): FREET3 Anemia work up No results for input(s): VITAMINB12, FOLATE, FERRITIN, TIBC, IRON, RETICCTPCT in the last 72 hours. Urinalysis    Component Value Date/Time   COLORURINE YELLOW (A) 12/17/2019 1358   APPEARANCEUR CLEAR (A) 12/17/2019 1358   APPEARANCEUR Hazy (A) 10/31/2019 1342   LABSPEC 1.013 12/17/2019 1358   LABSPEC 1.005 04/27/2014 1404   PHURINE 5.0 12/17/2019 1358   GLUCOSEU NEGATIVE 12/17/2019 1358   GLUCOSEU Negative 04/27/2014 1404   HGBUR NEGATIVE 12/17/2019 1358   BILIRUBINUR NEGATIVE 12/17/2019 1358   BILIRUBINUR Negative 10/31/2019 1342   BILIRUBINUR Negative 04/27/2014 1404   KETONESUR NEGATIVE 12/17/2019 1358   PROTEINUR NEGATIVE 12/17/2019 1358   NITRITE NEGATIVE 12/17/2019 1358   LEUKOCYTESUR NEGATIVE 12/17/2019 1358   LEUKOCYTESUR Negative 04/27/2014 1404   Sepsis Labs Invalid input(s): PROCALCITONIN,  WBC,  LACTICIDVEN Microbiology No results found for this or any previous visit (from the past 240 hour(s)).   Total time spend on discharging this patient, including the last patient exam, discussing the hospital stay, instructions for ongoing care as it relates to all pertinent  caregivers, as well as preparing the medical discharge records, prescriptions, and/or referrals as applicable, is 30 minutes.    Enzo Bi, MD  [facility] 12/17/2019, 5:44 PM  If 7PM-7AM, please contact night-coverage

## 2019-12-17 NOTE — ED Notes (Signed)
Pt still receiving testing in radiology.

## 2019-12-25 ENCOUNTER — Encounter: Payer: Self-pay | Admitting: Family

## 2019-12-25 ENCOUNTER — Telehealth (INDEPENDENT_AMBULATORY_CARE_PROVIDER_SITE_OTHER): Payer: Medicare Other | Admitting: Family

## 2019-12-25 ENCOUNTER — Other Ambulatory Visit: Payer: Self-pay

## 2019-12-25 VITALS — Ht 61.0 in | Wt 145.0 lb

## 2019-12-25 DIAGNOSIS — I2510 Atherosclerotic heart disease of native coronary artery without angina pectoris: Secondary | ICD-10-CM | POA: Diagnosis not present

## 2019-12-25 DIAGNOSIS — R002 Palpitations: Secondary | ICD-10-CM

## 2019-12-25 DIAGNOSIS — Z720 Tobacco use: Secondary | ICD-10-CM | POA: Diagnosis not present

## 2019-12-25 DIAGNOSIS — I6521 Occlusion and stenosis of right carotid artery: Secondary | ICD-10-CM

## 2019-12-25 DIAGNOSIS — E785 Hyperlipidemia, unspecified: Secondary | ICD-10-CM

## 2019-12-25 NOTE — Progress Notes (Signed)
Virtual Visit via Telephone Note   This visit type was conducted due to national recommendations for restrictions regarding the COVID-19 Pandemic (e.g. social distancing) in an effort to limit this patient's exposure and mitigate transmission in our community.  Due to her co-morbid illnesses, this patient is at least at moderate risk for complications without adequate follow up.  This format is felt to be most appropriate for this patient at this time.  The patient did not have access to video technology/had technical difficulties with video requiring transitioning to audio format only (telephone).  All issues noted in this document were discussed and addressed.  No physical exam could be performed with this format.  Please refer to the patient's chart for her  consent to telehealth for [facility].   The patient was identified using 2 identifiers.  Date:  12/25/2019   ID:  Lindsey Bautista, DOB 1961-02-19, MRN [id number]  Patient Location: Home Provider Location: Office  PCP:  Venita Lick, NP  Cardiologist:  Ida Rogue, MD  Electrophysiologist:  None   Evaluation Performed:  Follow-Up Visit  Chief Complaint:  Follow up after ED visit for chest pain  History of Present Illness:    Lindsey Bautista is a 59 y.o. female with tobacco use, CAD, HLD, anxiety/depression. Additional noted history of medical noncompliance. She had stroke symptoms April 2014 unable to talk for 4 days, recurrent episode in 2017, diagnosed with conversion disorder in March 2018.   Her CAD is s/p LHC 05/2018 with mild to moderate nonobstructive CAD. Most significant lesion 50% mid LAD stenosis which was not hemodynamically significant. Normal LVEF and mildly elevated LVEDP. She was trialed on low dose Imdur but did not tolerate due to headache. Leane Call 06/2019 was low risk with no ischemia or scar and EF >65%.   ED visit 12/17/19 reporting weakness, tingling let side head, weakness right side of body,  centra/left chest discomfort radiating to neck and down arm. EKG was without acute ischemic changes and HS-TnI of <2 and 3 . MRI of head and neck, brain with no acute finding and no carotid stenosis bilaterally. Her neck pain was thought to be muscle strain.   Reports since leaving the ED she has had some recurrent symptoms. Tells me the last few weeks it has happened infrequently, but she notices some rapid heart rates. Reports it only lasts a short time and then normalizes. Tells me the pain she occasionally gets in her chest will radiate to her back and the left side of her head will "tingle". She also gets pain in her neck  It has woken her up from sleep that feels "pressurey" or like a "sticking sensation". Tells me it happens "at any time". It is sometimes associated with shortness of breath. Tells me she has "no energy".  Tells me she "does not trust [facility]".  Asked to review neck MRI as she was told that showed no carotid artery disease.  Reassurance provided that the study on my review did not fact not show carotid artery disease.  Discussed carotid duplex in 2019 with right-sided less than 50% stenosis.  Discussed that her aspirin, atorvastatin, and Zetia will help prevent this.  Lost her mother 11 months ago. Lost her uncle 5 months ago unexpectedly. Tells me her mother had heart issues as well as carotid disease. Tells me it was the same with her uncle. Very concerned about hereditary disease. Asks me "how would we know if the blockage has got worse".  Long discussion regarding guideline directed therapy, her recent stress test in November, typical presentation of angina.  Did my best to reassure her.  We discussed potentially getting involved in a support group or cardiac rehab.  The patient does not have symptoms concerning for COVID-19 infection (fever, chills, cough, or new shortness of breath).    Past Medical History:  Diagnosis Date  . Atypical chest pain    a. 08/2016 Neg MV.  Lindsey Bautista  COPD (chronic obstructive pulmonary disease) ([place])   . Coronary artery disease, non-occlusive    a.  LHC 06/19/2018: Mid LAD 50% with iFR 0.94  . Depression   . Diastolic dysfunction    a. 10/2016 Echo: EF 60-65%, no rwma, Gr1 DD, mild MR, nl LA size, nl RV fxn; b. 05/2018 Echo: EF 60-65%. No rwma. Mild MR. Nl RV fxn.  Lindsey Bautista GERD (gastroesophageal reflux disease)   . Hypotension   . IBS (irritable bowel syndrome)   . Right arm weakness   . Situational syncope   . TIA (transient ischemic attack)    Past Surgical History:  Procedure Laterality Date  . COLONOSCOPY WITH PROPOFOL N/A 11/22/2015   Procedure: COLONOSCOPY WITH PROPOFOL;  Surgeon: Manya Silvas, MD;  [facility] END[facility]COPY;  Service: Endoscopy;  Laterality: N/A;  . ESOPHAGOGASTRODUODENOSCOPY (EGD) WITH PROPOFOL N/A 11/22/2015   Procedure: ESOPHAGOGASTRODUODENOSCOPY (EGD) WITH PROPOFOL;  Surgeon: Manya Silvas, MD;  Location: [facility] ENDOSCOPY;  Service: Endoscopy;  Laterality: N/A;  . INTRAVASCULAR PRESSURE WIRE/FFR STUDY N/A 06/19/2018   Procedure: INTRAVASCULAR PRESSURE WIRE/FFR STUDY;  Surgeon: Nelva Bush, MD;  Location: [place] CV LAB;  Service: Cardiovascular;  Laterality: N/A;  . LEFT HEART CATH AND CORONARY ANGIOGRAPHY N/A 06/19/2018   Procedure: LEFT HEART CATH AND CORONARY ANGIOGRAPHY;  Surgeon: Nelva Bush, MD;  Location: [place] [facility];  Service: Cardiovascular;  Laterality: N/A;  . OOPHORECTOMY    . TONSILLECTOMY    . VAGINAL HYSTERECTOMY       Current Meds  Medication Sig  . aspirin 81 MG tablet Take 81 mg by mouth daily.  Lindsey Bautista atorvastatin (LIPITOR) 80 MG tablet Take 1 tablet (80 mg total) by mouth daily at 6 PM. (Patient taking differently: Take 80 mg by mouth daily. )  . clonazePAM (KLONOPIN) 1 MG tablet Take 1 tablet (1 mg total) by mouth 2 (two) times daily. Need visit for further refills please.  . docusate sodium (COLACE) 100 MG capsule Take 100 mg by mouth daily.   Lindsey Bautista EPINEPHrine 0.3  mg/0.3 mL IJ SOAJ injection Inject 0.3 mLs (0.3 mg total) into the muscle as needed for anaphylaxis.  Lindsey Bautista estradiol (ESTRACE) 0.5 MG tablet Take 1 tablet by mouth once daily  . ezetimibe (ZETIA) 10 MG tablet Take 1 tablet (10 mg total) by mouth daily.  Lindsey Bautista FLUoxetine (PROZAC) 20 MG capsule Take 1 capsule (20 mg total) by mouth daily.  Lindsey Bautista linaclotide (LINZESS) 72 MCG capsule TAKE 1 CAPSULE BY MOUTH ONCE DAILY BEFORE BREAKFAST  . nitroGLYCERIN (NITROSTAT) 0.4 MG SL tablet Place 1 tablet (0.4 mg total) under the tongue every 5 (five) minutes as needed for chest pain.     Allergies:   Flagyl [metronidazole], Penicillin g, Iodinated diagnostic agents, and Flomax [tamsulosin hcl]   Social History   Tobacco Use  . Smoking status: Current Every Day Smoker    Packs/day: 1.00    Years: 39.00    Pack years: 39.00    Types: Cigarettes  . Smokeless tobacco: Never Used  Substance Use Topics  .  Alcohol use: No    Alcohol/week: 0.0 standard drinks  . Drug use: No     Family Hx: The patient's family history includes Bladder Cancer in her mother; Breast cancer (age of onset: 65) in her paternal aunt; COPD in her father and another family member; Colon cancer in her brother; Heart disease in an other family member; Heart failure in her mother; Renal Disease in her mother; Stroke in an other family member.  ROS:   Please see the history of present illness.    Review of Systems  Constitution: Negative for chills, fever and malaise/fatigue.  Cardiovascular: Positive for chest pain and palpitations. Negative for dyspnea on exertion, leg swelling, near-syncope, orthopnea and syncope.  Respiratory: Negative for cough, shortness of breath and wheezing.   Musculoskeletal: Positive for neck pain.  Gastrointestinal: Negative for nausea and vomiting.  Neurological: Negative for dizziness, light-headedness and weakness.  Psychiatric/Behavioral: The patient is nervous/anxious.    All other systems reviewed and are  negative.  Prior CV studies:   The following studies were reviewed today:  Lexiscan Myoview 07/15/2019  Normal pharmacologic myocardial perfusion stress test without significant ischemia or scar.  The left ventricular ejection fraction is hyperdynamic (>65%).  There is no significant coronary artery calcification.  Aortic atherosclerosis is noted on the attenuation correction CT.  This is a low risk study.   Cardiac catheterization 2019 Conclusions: 1. Mild to moderate, non-obstructive coronary artery disease.  Most significant lesion is a 50% mid LAD stenosis, which is not hemodynamically significant (iFR 0.94). 2. Normal left ventricular systolic function. 3. Mildly elevated left ventricular filling pressure.   Recommendations: 1. Medical therapy; will add isosorbide mononitrate 15 mg daily. 2. Risk factor modification to prevent progression of disease, including high-intensity statin therapy and smoking cessation. 3. Anticipate discharge home this afternoon and outpatient follow-up with Dr. Rockey Situ.   Recommend Aspirin 81mg  daily for moderate CAD.   Labs/Other Tests and Data Reviewed:    EKG:  An ECG dated 12/17/2019 was personally reviewed today and demonstrated:  Normal sinus rhythm and no acute ST/T wave changes.  Recent Labs: 05/14/2019: TSH 1.100 06/06/2019: B Natriuretic Peptide 32.0 11/20/2019: ALT 19 12/17/2019: BUN 14; Creatinine, Ser 0.86; Hemoglobin 14.7; Platelets 273; Potassium 4.0; Sodium 138   Recent Lipid Panel Lab Results  Component Value Date/Time   CHOL 129 10/31/2019 02:27 PM   CHOL 164 09/02/2018 10:56 AM   CHOL 203 (H) 11/28/2013 04:24 AM   TRIG 63 10/31/2019 02:27 PM   TRIG 76 09/02/2018 10:56 AM   TRIG 140 11/28/2013 04:24 AM   HDL 54 10/31/2019 02:27 PM   HDL 44 11/28/2013 04:24 AM   CHOLHDL 4.1 05/14/2019 02:01 PM   CHOLHDL 4.9 06/13/2018 04:55 AM   LDLCALC 62 10/31/2019 02:27 PM   LDLCALC 131 (H) 11/28/2013 04:24 AM    Wt Readings from  Last 3 Encounters:  12/25/19 145 lb (65.8 kg)  12/17/19 146 lb (66.2 kg)  11/22/19 146 lb (66.2 kg)     Objective:    Vital Signs:  Ht 5\' 1"  (1.549 m)   Wt 145 lb (65.8 kg)   BMI 27.40 kg/m    VITAL SIGNS:  reviewed  ASSESSMENT & PLAN:    1. CAD -LHC 05/2018 mild to moderate nonobstructive disease with 50% mid LAD stenosis not hemodynamically significant (IFR 0.94).  Low risk Myoview 06/2019.  She is previously intolerant to Imdur with headache.  Presented to the ED 12/09/2018 with chest pain.  Negative troponin x2.  Atypical in symptoms this occurred at rest, EKG no acute changes on my independent review.  Her symptoms are very atypical for angina and she had a recent nonlow risk Lexiscan Myoview no indication for repeat ischemia evaluation at this time.  GDMT includes aspirin, statin, and Zetia as well as as needed nitroglycerin.  Reassurance provided.  Anticipate likely etiology of her chest pain is anxiety.  We discussed her possibly participating cardiac rehab but she politely declines.  2. HLD -LDL 10/2019 of 62.  Continue atorvastatin 80 mg daily, Zetia 10 mg daily.   3. Tobacco abuse - Smoking cessation encouraged. Recommend utilization of 1800QUITNOW.  4. Anxiety - Likely contributory to her symptoms.  Continue to follow with PCP.  We did discuss potentially getting involved with a support group for women with heart disease and she was provided with a link.  5. Palpitations -reports intermittent rapid heart rates over the last few days.  Does not check heart rate but tells me she can feel her heart racing.  Not associated with pain or shortness of breath.  We discussed potential ZIO monitor which she politely declines.  Recent ED visit with normal electrolytes, and no anemia.  Likely etiology anxiety.  6. Carotid artery disease -duplex 2019 with less than 50% stenosis of distal right ICA and left ICA with no significant stenosis.  MRI of neck during recent ED visit 12/17/2019 with no  evidence of carotid stenosis.  Reassurance provided.  Continue aspirin, atorvastatin, Zetia.  She is very anxious regarding strong family history of carotid artery disease.  May benefit from repeat duplex in 1 year for monitoring.  Time:   Today, I have spent 16 minutes with the patient with telehealth technology discussing the above problems.     Medication Adjustments/Labs and Tests Ordered: Current medicines are reviewed at length with the patient today.  Concerns regarding medicines are outlined above.   Tests Ordered: None testing ordered today. No orders of the defined types were placed in this encounter.  Medication Changes: No medication changes made today. No orders of the defined types were placed in this encounter.  Follow Up: Follow up In Person in 4 month(s) with Dr. Arta Bruce or APP  Signed, Loel Dubonnet, NP  12/25/2019 3:02 PM    [place]

## 2019-12-25 NOTE — Patient Instructions (Addendum)
Medication Instructions:  No medication changes today.   Your Aspirin, Atorvastatin (Lipitor), and Ezetimibe (Zetia) will help protect from any worsening blockages.   *If you need a refill on your cardiac medications before your next appointment, please call your pharmacy*   Lab Work: None ordered today. Your lab work in the emergency department showed normal electrolytes, normal kidney function, and normal cardiac enzymes. These were great results!  Testing/Procedures: None ordered today. Your MRI from the emergency department did not show carotid stenosis - this is a great result!  Follow-Up:  Follow up with Dr. Rockey Situ on 05/03/20 at 2:20 PM   Other Instructions   If you notice more episodes of elevated heart rates you can let us know.  We can consider mailing a monitor to your home for you to wear.  Recommend reducing caffeine intake, no alcohol to limit episodes of palpitations.  Palpitations are also often triggered by stress.  Your description sound a lot like PVCs which are early beats that are not dangerous.   Many people benefit from participating in a program called cardiac rehab.  This is available at [facility] and they have a wonderful group of staff.  It is a 3 part program consisting of monitored exercise, dietary education, and support group.  Please let us know if you would like to participate in we will gladly send referral.   [facility] does have a support group for women with heart disease.  Right now they are meeting virtually.  If you would like to participate please visit the link at [email or web address]   If you wake up in the middle of the night with discomfort recommend deep breathing exercises.  Particularly, pursed lip breathing is very helpful.  Breathe in for 4 counts, then hold for for counts, then blow out this if you are blowing through a straw nice and slow.  This can help to relax you and lower your heart rate   Carotid artery  disease is typically not associated with neck pain.  Your cholesterol medications that you are on as well as your aspirin will help prevent any plaque from forming in these arteries.  Follow these instructions at home: Eating and drinking Follow instructions about your diet from your doctor. It is important to follow a healthy diet.  Eat a diet that includes: ? A lot of fresh fruits and vegetables. ? Low-fat (lean) meats.  Avoid these foods: ? Foods that are high in fat. ? Foods that are high in salt (sodium). ? Foods that are fried. ? Foods that are processed. ? Foods that have few good nutrients (poor nutritional value).  Lifestyle   Keep a healthy weight.  Do exercises as told by your doctor to stay active. Each week, you should get one of the following: ? At least 150 minutes of exercise that raises your heart rate and makes you sweat (moderate-intensity exercise). ? At least 75 minutes of exercise that takes a lot of effort.  Do not use any products that contain nicotine or tobacco, such as cigarettes, e-cigarettes, and chewing tobacco. If you need help quitting, ask your doctor.  Do not drink alcohol if: ? Your doctor tells you not to drink. ? You are pregnant, may be pregnant, or are planning to become pregnant.  If you drink alcohol: ? Limit how much you use to:  0-1 drink a day for women.  0-2 drinks a day for men. ? Be aware of how much alcohol is in your drink. In  the [place] one drink equals one 12 oz bottle of beer (355 mL), one 5 oz glass of wine (148 mL), or one 1 oz glass of hard liquor (44 mL).  Do not use drugs.  Manage your stress. Ask your doctor for tips on how to do this. General instructions  Take over-the-counter and prescription medicines only as told by your doctor.  Keep all follow-up visits as told by your doctor. This is important.

## 2020-01-06 DIAGNOSIS — Z01812 Encounter for preprocedural laboratory examination: Secondary | ICD-10-CM | POA: Diagnosis not present

## 2020-01-06 DIAGNOSIS — K5909 Other constipation: Secondary | ICD-10-CM | POA: Diagnosis not present

## 2020-01-06 DIAGNOSIS — R110 Nausea: Secondary | ICD-10-CM | POA: Diagnosis not present

## 2020-01-06 DIAGNOSIS — R1310 Dysphagia, unspecified: Secondary | ICD-10-CM | POA: Diagnosis not present

## 2020-01-06 DIAGNOSIS — R0789 Other chest pain: Secondary | ICD-10-CM | POA: Diagnosis not present

## 2020-01-06 DIAGNOSIS — R1011 Right upper quadrant pain: Secondary | ICD-10-CM | POA: Diagnosis not present

## 2020-01-08 ENCOUNTER — Ambulatory Visit: Payer: Medicare Other | Admitting: Gastroenterology

## 2020-01-28 ENCOUNTER — Other Ambulatory Visit: Payer: Self-pay | Admitting: Nurse Practitioner

## 2020-01-28 DIAGNOSIS — F419 Anxiety disorder, unspecified: Secondary | ICD-10-CM

## 2020-01-28 NOTE — Telephone Encounter (Signed)
Requested medication (s) are due for refill today:   To be decided by provider.  Non delegated refill Klonopin;  Estradiol is being requested too early  Requested medication (s) are on the active medication list:   Yes for both meds  Future visit scheduled:   No   Last ordered: Estradiol 12/09/2019 #90 0 refills;  Klonopin is non delegated refill   Requested Prescriptions  Pending Prescriptions Disp Refills   clonazePAM (KLONOPIN) 1 MG tablet [Pharmacy Med Name: clonazePAM 1 MG Oral Tablet] 60 tablet 0    Sig: TAKE 1 TABLET BY MOUTH TWICE DAILY . APPOINTMENT REQUIRED FOR FUTURE REFILLS      Not Delegated - Psychiatry:  Anxiolytics/Hypnotics Failed - 01/28/2020 10:51 AM      Failed - This refill cannot be delegated      Failed - Urine Drug Screen completed in last 360 days.      Passed - Valid encounter within last 6 months    Recent Outpatient Visits           2 months ago Depression, recurrent ([place])   [place], [place] T, NP   4 months ago Generalized abdominal pain   [place], [place] T, NP   6 months ago Atrophic vaginitis   [place], [place] T, NP   7 months ago Bacterial vaginosis   [place], [place] T, NP   8 months ago Depression, recurrent ([place])   [place], Barbaraann Faster, NP       Future Appointments             In 3 months Gollan, Kathlene November, MD [place], [facility]              estradiol (ESTRACE) 0.5 MG tablet [employer] Med Name: Estradiol 0.5 MG Oral Tablet] 90 tablet 0    Sig: Take 1 tablet by mouth once daily      OB/GYN:  Estrogens Passed - 01/28/2020 10:51 AM      Passed - Mammogram is up-to-date per Health Maintenance      Passed - Last BP in normal range    BP Readings from Last 1 Encounters:  12/17/19 125/70          Passed - Valid encounter within last 12 months    Recent Outpatient Visits           2 months ago  Depression, recurrent ([place])   [place], [place] T, NP   4 months ago Generalized abdominal pain   [place], [place] T, NP   6 months ago Atrophic vaginitis   [place], [place] T, NP   7 months ago Bacterial vaginosis   [place], [place] T, NP   8 months ago Depression, recurrent ([place])   [place], Barbaraann Faster, NP       Future Appointments             In 3 months Gollan, Kathlene November, MD [facility], [facility]

## 2020-01-28 NOTE — Telephone Encounter (Signed)
Lvm to make this apt. 

## 2020-01-28 NOTE — Telephone Encounter (Signed)
See message below °

## 2020-01-29 NOTE — Telephone Encounter (Signed)
Pt did not want to schedule apt at this time, Pt verbalized and confirmed understanding. Pt stated she would call to make this apt.

## 2020-02-10 DIAGNOSIS — Z01812 Encounter for preprocedural laboratory examination: Secondary | ICD-10-CM | POA: Diagnosis not present

## 2020-02-13 DIAGNOSIS — K2950 Unspecified chronic gastritis without bleeding: Secondary | ICD-10-CM | POA: Diagnosis not present

## 2020-02-13 DIAGNOSIS — R1011 Right upper quadrant pain: Secondary | ICD-10-CM | POA: Diagnosis not present

## 2020-02-13 DIAGNOSIS — R1310 Dysphagia, unspecified: Secondary | ICD-10-CM | POA: Diagnosis not present

## 2020-02-13 DIAGNOSIS — R194 Change in bowel habit: Secondary | ICD-10-CM | POA: Diagnosis not present

## 2020-02-13 DIAGNOSIS — K3189 Other diseases of stomach and duodenum: Secondary | ICD-10-CM | POA: Diagnosis not present

## 2020-02-13 DIAGNOSIS — K5900 Constipation, unspecified: Secondary | ICD-10-CM | POA: Diagnosis not present

## 2020-02-13 DIAGNOSIS — K640 First degree hemorrhoids: Secondary | ICD-10-CM | POA: Diagnosis not present

## 2020-02-13 DIAGNOSIS — K2930 Chronic superficial gastritis without bleeding: Secondary | ICD-10-CM | POA: Diagnosis not present

## 2020-02-13 DIAGNOSIS — K5904 Chronic idiopathic constipation: Secondary | ICD-10-CM | POA: Diagnosis not present

## 2020-02-13 DIAGNOSIS — R0789 Other chest pain: Secondary | ICD-10-CM | POA: Diagnosis not present

## 2020-02-13 DIAGNOSIS — K228 Other specified diseases of esophagus: Secondary | ICD-10-CM | POA: Diagnosis not present

## 2020-02-13 DIAGNOSIS — K2270 Barrett's esophagus without dysplasia: Secondary | ICD-10-CM | POA: Diagnosis not present

## 2020-02-13 LAB — HM COLONOSCOPY

## 2020-03-01 ENCOUNTER — Encounter: Payer: Self-pay | Admitting: Nurse Practitioner

## 2020-03-01 ENCOUNTER — Ambulatory Visit (INDEPENDENT_AMBULATORY_CARE_PROVIDER_SITE_OTHER): Payer: Medicare Other | Admitting: Nurse Practitioner

## 2020-03-01 DIAGNOSIS — F339 Major depressive disorder, recurrent, unspecified: Secondary | ICD-10-CM | POA: Diagnosis not present

## 2020-03-01 DIAGNOSIS — E782 Mixed hyperlipidemia: Secondary | ICD-10-CM | POA: Diagnosis not present

## 2020-03-01 DIAGNOSIS — F1320 Sedative, hypnotic or anxiolytic dependence, uncomplicated: Secondary | ICD-10-CM

## 2020-03-01 DIAGNOSIS — F419 Anxiety disorder, unspecified: Secondary | ICD-10-CM | POA: Diagnosis not present

## 2020-03-01 MED ORDER — CLONAZEPAM 1 MG PO TABS: ORAL_TABLET | ORAL | 2 refills | Status: DC

## 2020-03-01 MED ORDER — ATORVASTATIN CALCIUM 80 MG PO TABS: 80.0000 mg | ORAL_TABLET | Freq: Every day | ORAL | 4 refills | Status: DC

## 2020-03-01 NOTE — Assessment & Plan Note (Signed)
At length discussion on long term use, she is not interested in reduction at this time or medication changes.

## 2020-03-01 NOTE — Assessment & Plan Note (Signed)
Chronic, ongoing.  Continue current medication regimen and collaboration with cardiology.  Lipid panel next in office visit in 3 months.

## 2020-03-01 NOTE — Progress Notes (Addendum)
There were no vitals taken for this visit.   Subjective:    Patient ID: Lindsey Bautista, female    DOB: 1960-11-17, 59 y.o.   MRN: [id number]  HPI: Lindsey Bautista is a 59 y.o. female  Chief Complaint  Patient presents with  . Anxiety  . Hyperlipidemia    . This visit was completed via telephone due to the restrictions of the COVID-19 pandemic. All issues as above were discussed and addressed but no physical exam was performed. If it was felt that the patient should be evaluated in the office, they were directed there. The patient verbally consented to this visit. Patient was unable to complete an audio/visual visit due to Technical difficulties,Lack of internet. Due to the catastrophic nature of the COVID-19 pandemic, this visit was done through audio contact only. . Location of the patient: home . Location of the provider: work . Those involved with this call:  . Provider: Marnee Guarneri, DNP . CMA: Yvonna Alanis, CMA . Front Desk/Registration: Don Perking  . Time spent on call: 20 minutes on the phone discussing health concerns. 15 minutes total spent in review of patient's record and preparation of their chart.  . I verified patient identity using two factors (patient name and date of birth). Patient consents verbally to being seen via telemedicine visit today.    DEPRESSION/ANXIETY Continues on Prozac 20 MG daily and Klonopin 1 MG BID.  Pt is aware of risks of benzo medication use to include increased sedation, respiratory suppression, falls, dependence and cardiovascular events.  Pt would like to continue treatment as benefit determined to outweigh risk.  She inquired into whether she could increase Klonopin, discussed with her that would prefer psychiatry involved if changes in this were needed.  At this time she agrees to continuing current dose.  On PDMP review last Klonopin fill 02/05/20 for #60.  No other controlled substances noted. Mood status: stable Satisfied  with current treatment?: yes Symptom severity: mild  Duration of current treatment : chronic Side effects: no Medication compliance: good compliance Psychotherapy/counseling: none Previous psychiatric medications: Prozac and Klonopin Depressed mood: no Anxious mood: yes Anhedonia: no Significant weight loss or gain: no Insomnia: yes hard to fall asleep Fatigue: at times Feelings of worthlessness or guilt: none Impaired concentration/indecisiveness: yes Suicidal ideations: no Hopelessness: no Crying spells: no Depression screen [facility] 2/9 03/01/2020 10/31/2019 05/14/2019 09/02/2018 05/27/2018  Decreased Interest 1 2 3 1  0  Down, Depressed, Hopeless 1 2 3 1 2   PHQ - 2 Score 2 4 6 2 2   Altered sleeping 1 1 2 2 3   Tired, decreased energy 3 3 3 3 3   Change in appetite 0 2 3 1 3   Feeling bad or failure about yourself  1 2 3 1 2   Trouble concentrating 0 2 2 2 1   Moving slowly or fidgety/restless 1 2 3 1 2   Suicidal thoughts 0 0 0 0 0  PHQ-9 Score 8 16 22 12 16   Difficult doing work/chores Somewhat difficult Somewhat difficult Somewhat difficult - Somewhat difficult   GAD 7 : Generalized Anxiety Score 03/01/2020 10/31/2019 05/14/2019 09/02/2018  Nervous, Anxious, on Edge 1 2 3 1   Control/stop worrying 1 3 3 3   Worry too much - different things 1 2 3 3   Trouble relaxing 0 1 3 1   Restless 0 1 2 2   Easily annoyed or irritable 1 3 3 2   Afraid - awful might happen 2 2 3 1   Total GAD 7 Score 6  14 20 13   Anxiety Difficulty Somewhat difficult Somewhat difficult Somewhat difficult -   HYPERLIPIDEMIA Had Zetia added at last cardiology visit, last saw Dr. Rockey Situ in November 2020.  Atorvastatin 80 MG daily and Zetia 10 MG daily.  Last lipid panel in March 2021 -- LDL 62. Hyperlipidemia status: good compliance Satisfied with current treatment?  yes Side effects:  no Medication compliance: good compliance Past cholesterol meds: Atorvastatin and Zetia Supplements: none Aspirin:  yes The ASCVD Risk  score Mikey Bussing DC Jr., et al., 2013) failed to calculate for the following reasons:   The valid total cholesterol range is 130 to 320 mg/dL Chest pain:  no Coronary artery disease:  no Family history CAD:  no Family history early CAD:  no   Relevant past medical, surgical, family and social history reviewed and updated as indicated. Interim medical history since our last visit reviewed. Allergies and medications reviewed and updated.  Review of Systems  Constitutional: Negative for activity change, appetite change, diaphoresis, fatigue and fever.  Respiratory: Negative for cough, chest tightness, shortness of breath and wheezing.   Cardiovascular: Negative for chest pain, palpitations and leg swelling.  Gastrointestinal: Negative.   Endocrine: Negative.   Neurological: Negative.   Psychiatric/Behavioral: Negative.     Per HPI unless specifically indicated above     Objective:    There were no vitals taken for this visit.  Wt Readings from Last 3 Encounters:  12/25/19 145 lb (65.8 kg)  12/17/19 146 lb (66.2 kg)  11/22/19 146 lb (66.2 kg)    Physical Exam   Unable to perform due to telephone visit only.  Results for orders placed or performed during the hospital encounter of 12/17/19  Respiratory Panel by RT PCR (Flu A&B, Covid) -  Result Value Ref Range   SARS Coronavirus 2 by RT PCR NEGATIVE NEGATIVE   Influenza A by PCR NEGATIVE NEGATIVE   Influenza B by PCR NEGATIVE NEGATIVE  Basic metabolic panel  Result Value Ref Range   Sodium 138 135 - 145 mmol/L   Potassium 4.0 3.5 - 5.1 mmol/L   Chloride 104 98 - 111 mmol/L   CO2 28 22 - 32 mmol/L   Glucose, Bld 71 70 - 99 mg/dL   BUN 14 6 - 20 mg/dL   Creatinine, Ser 0.86 0.44 - 1.00 mg/dL   Calcium 9.1 8.9 - 10.3 mg/dL   GFR calc non Af Amer >60 >60 mL/min   GFR calc Af Amer >60 >60 mL/min   Anion gap 6 5 - 15  CBC  Result Value Ref Range   WBC 6.0 4.0 - 10.5 K/uL   RBC 4.74 3.87 - 5.11 MIL/uL   Hemoglobin 14.7 12.0 -  15.0 g/dL   HCT 43.3 36 - 46 %   MCV 91.4 80.0 - 100.0 fL   MCH 31.0 26.0 - 34.0 pg   MCHC 33.9 30.0 - 36.0 g/dL   RDW 12.7 11.5 - 15.5 %   Platelets 273 150 - 400 K/uL   nRBC 0.0 0.0 - 0.2 %  Urinalysis, Complete w Microscopic  Result Value Ref Range   Color, Urine YELLOW (A) YELLOW   APPearance CLEAR (A) CLEAR   Specific Gravity, Urine 1.013 1.005 - 1.030   pH 5.0 5.0 - 8.0   Glucose, UA NEGATIVE NEGATIVE mg/dL   Hgb urine dipstick NEGATIVE NEGATIVE   Bilirubin Urine NEGATIVE NEGATIVE   Ketones, ur NEGATIVE NEGATIVE mg/dL   Protein, ur NEGATIVE NEGATIVE mg/dL   Nitrite NEGATIVE NEGATIVE  Leukocytes,Ua NEGATIVE NEGATIVE   RBC / HPF 0-5 0 - 5 RBC/hpf   WBC, UA 0-5 0 - 5 WBC/hpf   Bacteria, UA NONE SEEN NONE SEEN   Squamous Epithelial / LPF 0-5 0 - 5   Mucus PRESENT   Troponin I (High Sensitivity)  Result Value Ref Range   Troponin I (High Sensitivity) <2 <18 ng/L  Troponin I (High Sensitivity)  Result Value Ref Range   Troponin I (High Sensitivity) 3 <18 ng/L      Assessment & Plan:   Problem List Items Addressed This Visit      Other   Depression, recurrent (HCC) - Primary    Chronic, stable.  Continue current medication regimen and adjust as needed.  Denies SI/HI.  Consider change from Prozac to Zoloft in future, which would offer benefit with anxiety and depression, may assist with reduction on long term benzo.  Return in 3 months.      Benzodiazepine dependence, continuous ([place])    At length discussion on long term use, she is not interested in reduction at this time or medication changes.      Hyperlipidemia    Chronic, ongoing.  Continue current medication regimen and collaboration with cardiology.  Lipid panel next in office visit in 3 months.      Relevant Medications   atorvastatin (LIPITOR) 80 MG tablet   Anxiety    Chronic, ongoing.  Continue Klonopin at this time, patient educated on risks of medication and is not interested in reducing.  Continue  Prozac, discussed with her changing to Zoloft if increase anxiety/depression noted.  At this time she denies SI/HI and wishes to maintain current regimen.      Relevant Medications   clonazePAM (KLONOPIN) 1 MG tablet (Start on 03/06/2020)      I discussed the assessment and treatment plan with the patient. The patient was provided an opportunity to ask questions and all were answered. The patient agreed with the plan and demonstrated an understanding of the instructions.   The patient was advised to call back or seek an in-person evaluation if the symptoms worsen or if the condition fails to improve as anticipated.   I provided 21+ minutes of time during this encounter.  Follow up plan: Return in about 3 months (around 06/01/2020) for Anxiety, COPD, smoking, HLD, aortic atherosclerosis -- need spirometry.

## 2020-03-01 NOTE — Patient Instructions (Signed)

## 2020-03-01 NOTE — Assessment & Plan Note (Signed)
Chronic, ongoing.  Continue Klonopin at this time, patient educated on risks of medication and is not interested in reducing.  Continue Prozac, discussed with her changing to Zoloft if increase anxiety/depression noted.  At this time she denies SI/HI and wishes to maintain current regimen.

## 2020-03-01 NOTE — Assessment & Plan Note (Signed)
Chronic, stable.  Continue current medication regimen and adjust as needed.  Denies SI/HI.  Consider change from Prozac to Zoloft in future, which would offer benefit with anxiety and depression, may assist with reduction on long term benzo.  Return in 3 months.

## 2020-04-14 ENCOUNTER — Ambulatory Visit: Payer: Self-pay | Admitting: *Deleted

## 2020-04-14 NOTE — Telephone Encounter (Signed)
Yes, agree with triage nurse.  Patient does need testing ASAP if symptomatic and if worsening symptoms recommend UC or ER.  Try to schedule for virtual if possible this week or early next week with provider in office.

## 2020-04-14 NOTE — Telephone Encounter (Signed)
Caller requesting advise about symptoms and getting covid tested. C/o headache, ache all over, and dry cough. Unable to make appt for covid testing until she can find transportation. Symptoms started a couple of days ago.exposed to covid 1 week ago. Unable to check for fever due to no thermometer. Reviewed isolation precautions. Care advise given. Patient verbalized understanding of care advise and to call back or go to urgent care or ED is symptoms worsen. encouraged patient to call back and set up covid testing if she can get transportation.  Reason for Disposition . [1] COVID-19 infection suspected by caller or triager AND [2] mild symptoms (cough, fever, or others) AND [5] no complications or SOB  Answer Assessment - Initial Assessment Questions 1. COVID-19 DIAGNOSIS: "Who made your Coronavirus (COVID-19) diagnosis?" "Was it confirmed by a positive lab test?" If not diagnosed by a HCP, ask "Are there lots of cases (community spread) where you live?" (See public health department website, if unsure)     Unable to get transportation to get tested 2. COVID-19 EXPOSURE: "Was there any known exposure to COVID before the symptoms began?" CDC Definition of close contact: within 6 feet (2 meters) for a total of 15 minutes or more over a 24-hour period.      yes 3. ONSET: "When did the COVID-19 symptoms start?"      A couple of days ago  4. WORST SYMPTOM: "What is your worst symptom?" (e.g., cough, fever, shortness of breath, muscle aches)     Body aches , headache , dry cough,  5. COUGH: "Do you have a cough?" If Yes, ask: "How bad is the cough?"       Yes dry cough 6. FEVER: "Do you have a fever?" If Yes, ask: "What is your temperature, how was it measured, and when did it start?"     Not sure unable to check temperature, no thermometer. Positive for sweating and chills 7. RESPIRATORY STATUS: "Describe your breathing?" (e.g., shortness of breath, wheezing, unable to speak)      No difficulty  breating 8. BETTER-SAME-WORSE: "Are you getting better, staying the same or getting worse compared to yesterday?"  If getting worse, ask, "In what way?"     worse 9. HIGH RISK DISEASE: "Do you have any chronic medical problems?" (e.g., asthma, heart or lung disease, weak immune system, obesity, etc.)     Heart blockage, COPD, stroke, TIAs  10. PREGNANCY: "Is there any chance you are pregnant?" "When was your last menstrual period?"       na 11. OTHER SYMPTOMS: "Do you have any other symptoms?"  (e.g., chills, fatigue, headache, loss of smell or taste, muscle pain, sore throat; new loss of smell or taste especially support the diagnosis of COVID-19)       Headache, neck pain, body aches, possible fever, unable to check temperature  Protocols used: CORONAVIRUS (COVID-19) DIAGNOSED OR SUSPECTED-A-AH

## 2020-04-15 NOTE — Telephone Encounter (Signed)
Noted  

## 2020-04-15 NOTE — Telephone Encounter (Signed)
Pt stated she was lying in bed and was still not feeling well, I relayed providers message Pt said she would go to UC or ER if she wasn't feeling better, I offered multiple apt with multiple providers for a virtual apt this week,  Pt stated she only wanted to see PCP on Monday, I made her the the virtual apt on 04/19/2020 at 10:20 am. Pt verbalized and confirmed understanding

## 2020-04-19 ENCOUNTER — Telehealth (INDEPENDENT_AMBULATORY_CARE_PROVIDER_SITE_OTHER): Payer: Medicare Other | Admitting: Nurse Practitioner

## 2020-04-19 ENCOUNTER — Encounter: Payer: Self-pay | Admitting: Nurse Practitioner

## 2020-04-19 DIAGNOSIS — J3489 Other specified disorders of nose and nasal sinuses: Secondary | ICD-10-CM | POA: Insufficient documentation

## 2020-04-19 MED ORDER — AZITHROMYCIN 250 MG PO TABS: ORAL_TABLET | ORAL | 0 refills | Status: DC

## 2020-04-19 NOTE — Patient Instructions (Signed)

## 2020-04-19 NOTE — Assessment & Plan Note (Signed)
Acute with negative Covid testing reported.  Recommend she continue to self quarantine until symptoms improved.  Will send in script for ZPack and recommend she continue OTC medications for symptoms.  Recommend she also add on daily Vitamin D3, Zinc, and Vitamin C.  Have highly recommended she obtain Covid 19 vaccine once she is feeling better and discussed at length with her.  Return to office for worsening or ongoing symptoms.

## 2020-04-19 NOTE — Progress Notes (Signed)
Wt 140 lb (63.5 kg)    BMI 26.45 kg/m    Subjective:    Patient ID: Lindsey Bautista, female    DOB: [birth year], 59 y.o.   MRN: [id number]  HPI: Lindsey Bautista is a 59 y.o. female  Chief Complaint  Patient presents with   Cough   Generalized Body Aches    pt states has had a covid test this past Thurdays, negative results     This visit was completed via telephone due to the restrictions of the COVID-19 pandemic. All issues as above were discussed and addressed but no physical exam was performed. If it was felt that the patient should be evaluated in the office, they were directed there. The patient verbally consented to this visit. Patient was unable to complete an audio/visual visit due to Lack of equipment. Due to the catastrophic nature of the COVID-19 pandemic, this visit was done through audio contact only.  Location of the patient: home  Location of the provider: work  Those involved with this call:   Provider: Marnee Guarneri, DNP  CMA: Yvonna Alanis, [place] Desk/Registration: Don Perking   Time spent on call: 21 minutes on the phone discussing health concerns. 15 minutes total spent in review of patient's record and preparation of their chart.   I verified patient identity using two factors (patient name and date of birth). Patient consents verbally to being seen via telemedicine visit today.    UPPER RESPIRATORY TRACT INFECTION Reports feeling better today, but all last week was in the bed.  She did go on 04/15/20 for Covid testing which was negative, but she is unsure this was done properly.  She has not taken vaccine, discussed at length with her and listened to her fears.  Denies loss of taste or smell. Fever: last week, none this week Cough: occasional Shortness of breath: no Wheezing: no Chest pain: no Chest tightness: no Chest congestion: no Nasal congestion: no Runny nose: yes Post nasal drip: yes Sneezing: no Sore throat: no Swollen  glands: no Sinus pressure: no Headache: no Face pain: no Toothache: no Ear pain: none Ear pressure: none Eyes red/itching:no Eye drainage/crusting: no  Vomiting: no Rash: no Fatigue: yes Sick contacts: no Strep contacts: no  Context: better Recurrent sinusitis: no Relief with OTC cold/cough medications: yes  Treatments attempted: cold/sinus   Relevant past medical, surgical, family and social history reviewed and updated as indicated. Interim medical history since our last visit reviewed. Allergies and medications reviewed and updated.  Review of Systems  Constitutional: Positive for fatigue. Negative for activity change, appetite change, diaphoresis and fever.  HENT: Positive for congestion, postnasal drip and rhinorrhea. Negative for ear discharge, ear pain, facial swelling, sinus pressure, sinus pain, sneezing, sore throat and voice change.   Eyes: Negative for pain and visual disturbance.  Respiratory: Negative for cough, chest tightness, shortness of breath and wheezing.   Cardiovascular: Negative for chest pain, palpitations and leg swelling.  Gastrointestinal: Negative for abdominal distention, abdominal pain, constipation, diarrhea, nausea and vomiting.  Endocrine: Negative.   Musculoskeletal: Negative for myalgias.  Neurological: Negative for dizziness, numbness and headaches.  Psychiatric/Behavioral: Negative.     Per HPI unless specifically indicated above     Objective:    Wt 140 lb (63.5 kg)    BMI 26.45 kg/m   Wt Readings from Last 3 Encounters:  04/19/20 140 lb (63.5 kg)  12/25/19 145 lb (65.8 kg)  12/17/19 146 lb (66.2 kg)  Physical Exam   Unable to perform due to telephone visit only.  Results for orders placed or performed in visit on 03/04/20  HM COLONOSCOPY  Result Value Ref Range   HM Colonoscopy See Report (in chart) See Report (in chart), Patient Reported      Assessment & Plan:   Problem List Items Addressed This Visit      Other     Sinus pressure    Acute with negative Covid testing reported.  Recommend she continue to self quarantine until symptoms improved.  Will send in script for ZPack and recommend she continue OTC medications for symptoms.  Recommend she also add on daily Vitamin D3, Zinc, and Vitamin C.  Have highly recommended she obtain Covid 19 vaccine once she is feeling better and discussed at length with her.  Return to office for worsening or ongoing symptoms.         I discussed the assessment and treatment plan with the patient. The patient was provided an opportunity to ask questions and all were answered. The patient agreed with the plan and demonstrated an understanding of the instructions.   The patient was advised to call back or seek an in-person evaluation if the symptoms worsen or if the condition fails to improve as anticipated.   I provided 21+ minutes of time during this encounter.  Follow up plan: No follow-ups on file.

## 2020-05-01 NOTE — Progress Notes (Signed)
Cardiology Office Note  Date:  05/03/2020   ID:  Lindsey Bautista, DOB [birth year], MRN [id number]  PCP:  Venita Lick, NP   Chief Complaint  Patient presents with  . Other    4 month follow up. Patient c/o chest discomfort and heavy arms. Meds reviewed verbally with patient.     HPI:  Lindsey Bautista is a 59 year old woman with history of  Medication noncompliance anxiety/depression,  long smoking history for close to 40 years, who continues to smoke stroke type symptoms in April 2015 where she could not talk for 4 days, again in April 2017 diagnosed with conversion disorder again in March 2018 CAD Hyperlipidemia Chronic chest pain, dyspnea on exertion  who presents for prior episode of syncope, chest pain.  Anxious about family history, prior diagnosis of nonobstructive coronary disease Some chest discomfort, not typically with exertion Arms heavy at times With walking,no energy Not rested in the AM.  Feels that she is sleeping okay On previous office visits with spending long time in bed  Stress test 2020 with no ischemia, no ejection fraction Also stress test 2013, 2015, 2016, 2018  Concerned about carotids, last evaluated 2 years ago, very mild disease  Continues to smoke  EKG personally reviewed by myself on todays visit Shows sinus bradycardia rate 57 bpm no significant ST-T wave changes  Other past medical history reviewed hospital in 05/2018 with chest pain and ruled out.  diagnostic cath which showed 50% mid LAD stenosis with FFR being normal.  Echo  normal LV systolic function.   Headaches on Imdur  Stroke type symptoms April 2017 diagnosed with conversion disorder Stress test January 2018 for chest pain with no ischemia  Had  stroke type symptomsMarch 2018 Right-sided weakness, migraine She had chest pain, multiple problems, right-sided weakness, headache Carotid u/s mild disease MRI was negative. Started on for verapamil for migraine.   Previously  having "passed out spells" sometimes when she is sitting, sometimes when she is standing.  Previous history of excess somnolence, random periods of weakness She is never injured herself when she "passes out" even despite having events when she is standing up.  CT scan of the abdomen from December 2016  showing moderate diffuse PAD extending into the common iliac arteries   PMH:   has a past medical history of Atypical chest pain, COPD (chronic obstructive pulmonary disease) ([place]), Coronary artery disease, non-occlusive, Depression, Diastolic dysfunction, GERD (gastroesophageal reflux disease), Hypotension, IBS (irritable bowel syndrome), Right arm weakness, Situational syncope, and TIA (transient ischemic attack).  PSH:    Past Surgical History:  Procedure Laterality Date  . COLONOSCOPY WITH PROPOFOL N/A 11/22/2015   Procedure: COLONOSCOPY WITH PROPOFOL;  Surgeon: Manya Silvas, MD;  Location: [facility] END[facility]COPY;  Service: Endoscopy;  Laterality: N/A;  . ESOPHAGOGASTRODUODENOSCOPY (EGD) WITH PROPOFOL N/A 11/22/2015   Procedure: ESOPHAGOGASTRODUODENOSCOPY (EGD) WITH PROPOFOL;  Surgeon: Manya Silvas, MD;  Location: [facility] ENDOSCOPY;  Service: Endoscopy;  Laterality: N/A;  . INTRAVASCULAR PRESSURE WIRE/FFR STUDY N/A 06/19/2018   Procedure: INTRAVASCULAR PRESSURE WIRE/FFR STUDY;  Surgeon: Nelva Bush, MD;  Location: [facility];  Service: Cardiovascular;  Laterality: N/A;  . LEFT HEART CATH AND CORONARY ANGIOGRAPHY N/A 06/19/2018   Procedure: LEFT HEART CATH AND CORONARY ANGIOGRAPHY;  Surgeon: Nelva Bush, MD;  Location: [facility];  Service: Cardiovascular;  Laterality: N/A;  . OOPHORECTOMY    . TONSILLECTOMY    . VAGINAL HYSTERECTOMY      Current Outpatient Medications  Medication Sig  Dispense Refill  . aspirin 81 MG tablet Take 81 mg by mouth daily.    [name] atorvastatin (LIPITOR) 80 MG tablet Take 1 tablet (80 mg total) by mouth daily. 90 tablet 4  . clonazePAM  (KLONOPIN) 1 MG tablet TAKE 1 TABLET BY MOUTH TWICE DAILY . 60 tablet 2  . docusate sodium (COLACE) 100 MG capsule Take 100 mg by mouth daily.     [name] EPINEPHrine 0.3 mg/0.3 mL IJ SOAJ injection Inject 0.3 mLs (0.3 mg total) into the muscle as needed for anaphylaxis. 1 each 0  . estradiol (ESTRACE) 0.5 MG tablet Take 1 tablet by mouth once daily 90 tablet 0  . ezetimibe (ZETIA) 10 MG tablet Take 1 tablet (10 mg total) by mouth daily. 90 tablet 3  . FLUoxetine (PROZAC) 20 MG capsule Take 1 capsule (20 mg total) by mouth daily. 90 capsule 3  . linaclotide (LINZESS) 72 MCG capsule TAKE 1 CAPSULE BY MOUTH ONCE DAILY BEFORE BREAKFAST 90 capsule 3  . nitroGLYCERIN (NITROSTAT) 0.4 MG SL tablet Place 1 tablet (0.4 mg total) under the tongue every 5 (five) minutes as needed for chest pain. 50 tablet 3  . pantoprazole (PROTONIX) 40 MG tablet Take 40 mg by mouth daily.     No current facility-administered medications for this visit.     Allergies:   Flagyl [metronidazole], Penicillin g, Iodinated diagnostic agents, and Flomax [tamsulosin hcl]   Social History:  The patient  reports that she has been smoking cigarettes. She has a 39.00 pack-year smoking history. She has never used smokeless tobacco. She reports that she does not drink alcohol and does not use drugs.   Family History:   family history includes Bladder Cancer in her mother; Breast cancer (age of onset: 20) in her paternal aunt; COPD in her father and another family member; Colon cancer in her brother; Heart disease in an other family member; Heart failure in her mother; Renal Disease in her mother; Stroke in an other family member.    Review of Systems: Review of Systems  Constitutional: Positive for malaise/fatigue.  HENT: Negative.   Respiratory: Negative.   Cardiovascular: Negative.   Gastrointestinal: Negative.   Musculoskeletal: Negative.   Neurological: Negative.   Psychiatric/Behavioral: Positive for depression. The patient is  nervous/anxious.   All other systems reviewed and are negative.   PHYSICAL EXAM: VS:  BP 128/80 (BP Location: Left Arm, Patient Position: Sitting, Cuff Size: Normal)   Pulse (!) 57   Ht 5\' 1"  (1.549 m)   Wt 141 lb 6 oz (64.1 kg)   SpO2 96%   BMI 26.71 kg/m  , BMI Body mass index is 26.71 kg/m. Constitutional:  oriented to person, place, and time. No distress.  HENT:  Head: Grossly normal Eyes:  no discharge. No scleral icterus.  Neck: No JVD, no carotid bruits  Cardiovascular: Regular rate and rhythm, no murmurs appreciated Pulmonary/Chest: Clear to auscultation bilaterally, no wheezes or rails Abdominal: Soft.  no distension.  no tenderness.  Musculoskeletal: Normal range of motion Neurological:  normal muscle tone. Coordination normal. No atrophy Skin: Skin warm and dry Psychiatric: normal affect, pleasant   Recent Labs: 05/14/2019: TSH 1.100 06/06/2019: B Natriuretic Peptide 32.0 11/20/2019: ALT 19 12/17/2019: BUN 14; Creatinine, Ser 0.86; Hemoglobin 14.7; Platelets 273; Potassium 4.0; Sodium 138    Lipid Panel Lab Results  Component Value Date   CHOL 129 10/31/2019   HDL 54 10/31/2019   LDLCALC 62 10/31/2019   TRIG 63 10/31/2019    Wt  Readings from Last 3 Encounters:  05/03/20 141 lb 6 oz (64.1 kg)  04/19/20 140 lb (63.5 kg)  12/25/19 145 lb (65.8 kg)       ASSESSMENT AND PLAN:  PAD (peripheral artery disease) (HCC) Moderate disease seen on CT scan Cholesterol at goal, no medication changes made No further testing needed  Situational syncope No episodes of syncope No further episodes  Abdominal aortic atherosclerosis (HCC) Smoking cessation, aggressive lipid management Continue Lipitor Zetia  Mixed hyperlipidemia - Plan: EKG 12-Lead lipitor and zetia Numbers at goal  Smoker - Plan: EKG 12-Lead We have encouraged her to continue to work on weaning her cigarettes and smoking cessation. She will continue to work on this and does not want any  assistance with chantix.   Centrilobular emphysema (HCC) Continues to smoke, chronic shortness of breath on exertion Smoking cessation recommended   Chest pressure - Plan: EKG 12-Lead Numerous stress test dating back to 2013 one a year or every other year No further testing at this time, last was done 2020  Depression/anxiety Managed by primary care Anxiety concerning underlying health issues   Total encounter time more than 25 minutes  Greater than 50% was spent in counseling and coordination of care with the patient   Disposition:   F/U  12 months   No orders of the defined types were placed in this encounter.    Signed, Esmond Plants, M.D., Ph.D. 05/03/2020  [place], [place]

## 2020-05-03 ENCOUNTER — Ambulatory Visit (INDEPENDENT_AMBULATORY_CARE_PROVIDER_SITE_OTHER): Payer: Medicare Other | Admitting: Cardiovascular Disease

## 2020-05-03 ENCOUNTER — Encounter: Payer: Self-pay | Admitting: Cardiovascular Disease

## 2020-05-03 ENCOUNTER — Other Ambulatory Visit: Payer: Self-pay

## 2020-05-03 VITALS — BP 128/80 | HR 57 | Ht 61.0 in | Wt 141.4 lb

## 2020-05-03 DIAGNOSIS — I2510 Atherosclerotic heart disease of native coronary artery without angina pectoris: Secondary | ICD-10-CM

## 2020-05-03 DIAGNOSIS — Z720 Tobacco use: Secondary | ICD-10-CM

## 2020-05-03 DIAGNOSIS — I700 Atherosclerosis of aorta: Secondary | ICD-10-CM

## 2020-05-03 DIAGNOSIS — I25118 Atherosclerotic heart disease of native coronary artery with other forms of angina pectoris: Secondary | ICD-10-CM

## 2020-05-03 DIAGNOSIS — I739 Peripheral vascular disease, unspecified: Secondary | ICD-10-CM | POA: Diagnosis not present

## 2020-05-03 DIAGNOSIS — E785 Hyperlipidemia, unspecified: Secondary | ICD-10-CM | POA: Diagnosis not present

## 2020-05-03 DIAGNOSIS — I6521 Occlusion and stenosis of right carotid artery: Secondary | ICD-10-CM | POA: Diagnosis not present

## 2020-05-03 DIAGNOSIS — R0602 Shortness of breath: Secondary | ICD-10-CM

## 2020-05-03 NOTE — Patient Instructions (Signed)
Medication Instructions:  No changes  If you need a refill on your cardiac medications before your next appointment, please call your pharmacy.    Lab work: No new labs needed   If you have labs (blood work) drawn today and your tests are completely normal, you will receive your results only by: . MyChart Message (if you have MyChart) OR . A paper copy in the mail If you have any lab test that is abnormal or we need to change your treatment, we will call you to review the results.   Testing/Procedures: No new testing needed   Follow-Up: At [facility], you and your health needs are our priority.  As part of our continuing mission to provide you with exceptional heart care, we have created designated Provider Care Teams.  These Care Teams include your primary Cardiologist (physician) and Advanced Practice Providers (APPs -  Physician Assistants and Nurse Practitioners) who all work together to provide you with the care you need, when you need it.  . You will need a follow up appointment in 12 months  . Providers on your designated Care Team:   . Christopher Berge, NP . Ryan Dunn, PA-C . Jacquelyn Visser, PA-C  Any Other Special Instructions Will Be Listed Below (If Applicable).  COVID-19 Vaccine Information can be found at: [email or web address] For questions related to vaccine distribution or appointments, please email [email or web address] or call [phone].     

## 2020-05-09 DIAGNOSIS — Z23 Encounter for immunization: Secondary | ICD-10-CM | POA: Diagnosis not present

## 2020-05-25 ENCOUNTER — Telehealth: Payer: Self-pay | Admitting: *Deleted

## 2020-05-25 ENCOUNTER — Telehealth: Payer: Self-pay | Admitting: Nurse Practitioner

## 2020-05-25 NOTE — Telephone Encounter (Signed)
Attempted to contact and schedule lung screening scan. Message left for patient to call back to schedule. 

## 2020-05-25 NOTE — Telephone Encounter (Signed)
Copied from [place] [phone]. Topic: Medicare AWV >> May 25, 2020  3:22 PM Cher Nakai R wrote: Reason for CRM:  Left message for patient to call back and schedule the Medicare Annual Wellness Visit (AWV) virtually.  Last AWV 05/29/2019  Please schedule at anytime with [facility]-Nurse Health Advisor.  45 minute appointment  Any questions, please call me at [phone]

## 2020-05-26 ENCOUNTER — Other Ambulatory Visit: Payer: Self-pay | Admitting: *Deleted

## 2020-05-26 DIAGNOSIS — Z87891 Personal history of nicotine dependence: Secondary | ICD-10-CM

## 2020-05-26 DIAGNOSIS — Z122 Encounter for screening for malignant neoplasm of respiratory organs: Secondary | ICD-10-CM

## 2020-05-26 NOTE — Progress Notes (Signed)
Current smoker, 41 pack year

## 2020-06-01 ENCOUNTER — Ambulatory Visit (INDEPENDENT_AMBULATORY_CARE_PROVIDER_SITE_OTHER): Payer: Medicare Other | Admitting: Nurse Practitioner

## 2020-06-01 ENCOUNTER — Other Ambulatory Visit: Payer: Self-pay

## 2020-06-01 ENCOUNTER — Ambulatory Visit
Admission: RE | Admit: 2020-06-01 | Discharge: 2020-06-01 | Disposition: A | Discharge status: Home / Self Care | Payer: Medicare Other | Source: Ambulatory Visit | Attending: Oncology | Admitting: Oncology

## 2020-06-01 ENCOUNTER — Encounter: Payer: Self-pay | Admitting: Nurse Practitioner

## 2020-06-01 VITALS — BP 145/81 | HR 64 | Temp 98.7°F | Ht 61.0 in | Wt 143.0 lb

## 2020-06-01 DIAGNOSIS — F339 Major depressive disorder, recurrent, unspecified: Secondary | ICD-10-CM

## 2020-06-01 DIAGNOSIS — F419 Anxiety disorder, unspecified: Secondary | ICD-10-CM

## 2020-06-01 DIAGNOSIS — J432 Centrilobular emphysema: Secondary | ICD-10-CM

## 2020-06-01 DIAGNOSIS — I6521 Occlusion and stenosis of right carotid artery: Secondary | ICD-10-CM

## 2020-06-01 DIAGNOSIS — Z122 Encounter for screening for malignant neoplasm of respiratory organs: Secondary | ICD-10-CM | POA: Insufficient documentation

## 2020-06-01 DIAGNOSIS — R1084 Generalized abdominal pain: Secondary | ICD-10-CM | POA: Diagnosis not present

## 2020-06-01 DIAGNOSIS — E782 Mixed hyperlipidemia: Secondary | ICD-10-CM | POA: Diagnosis not present

## 2020-06-01 DIAGNOSIS — F17210 Nicotine dependence, cigarettes, uncomplicated: Secondary | ICD-10-CM

## 2020-06-01 DIAGNOSIS — I700 Atherosclerosis of aorta: Secondary | ICD-10-CM | POA: Diagnosis not present

## 2020-06-01 DIAGNOSIS — Z79899 Other long term (current) drug therapy: Secondary | ICD-10-CM | POA: Diagnosis not present

## 2020-06-01 DIAGNOSIS — Z87891 Personal history of nicotine dependence: Secondary | ICD-10-CM

## 2020-06-01 DIAGNOSIS — M797 Fibromyalgia: Secondary | ICD-10-CM

## 2020-06-01 DIAGNOSIS — K219 Gastro-esophageal reflux disease without esophagitis: Secondary | ICD-10-CM

## 2020-06-01 LAB — UA/M W/RFLX CULTURE, ROUTINE
Bilirubin, UA: NEGATIVE
Glucose, UA: NEGATIVE
Ketones, UA: NEGATIVE
Leukocytes,UA: NEGATIVE
Nitrite, UA: NEGATIVE
Protein,UA: NEGATIVE
RBC, UA: NEGATIVE
Specific Gravity, UA: 1.015 (ref 1.005–1.030)
Urobilinogen, Ur: 0.2 mg/dL (ref 0.2–1.0)
pH, UA: 6 (ref 5.0–7.5)

## 2020-06-01 LAB — WET PREP FOR TRICH, YEAST, CLUE
Clue Cell Exam: POSITIVE — AB
Trichomonas Exam: NEGATIVE
Yeast Exam: NEGATIVE

## 2020-06-01 MED ORDER — ESTRADIOL 0.5 MG PO TABS
0.5000 mg | ORAL_TABLET | Freq: Every day | ORAL | 4 refills | Status: DC
Start: 2020-06-01 — End: 2020-08-05

## 2020-06-01 MED ORDER — LINACLOTIDE 72 MCG PO CAPS
ORAL_CAPSULE | ORAL | 4 refills | Status: DC
Start: 2020-06-01 — End: 2020-12-27

## 2020-06-01 MED ORDER — CLONAZEPAM 1 MG PO TABS: ORAL_TABLET | ORAL | 2 refills | Status: DC

## 2020-06-01 MED ORDER — CLINDAMYCIN PHOSPHATE 2 % VA CREA: 1.0000 | TOPICAL_CREAM | Freq: Every day | VAGINAL | 0 refills | Status: AC

## 2020-06-01 MED ORDER — EZETIMIBE 10 MG PO TABS: 10.0000 mg | ORAL_TABLET | Freq: Every day | ORAL | 4 refills | Status: DC

## 2020-06-01 MED ORDER — FLUOXETINE HCL 40 MG PO CAPS: 40.0000 mg | ORAL_CAPSULE | Freq: Every day | ORAL | 4 refills | Status: DC

## 2020-06-01 NOTE — Patient Instructions (Signed)

## 2020-06-01 NOTE — Assessment & Plan Note (Addendum)
Chronic, ongoing.  Continue Klonopin at this time, patient educated on risks of medication and is not interested in reducing.  Increase Prozac 40 MG which may benefit anxiety and fibromyalgia, discussed with her changing to Zoloft or Duloxetine if increase anxiety/depression noted.  At this time she denies SI/HI and wishes to maintain current regimen.  UDS today and contract next visit.

## 2020-06-01 NOTE — Assessment & Plan Note (Signed)
Chronic, well controlled at this time.  Check TSH today and continue current regimen.

## 2020-06-01 NOTE — Assessment & Plan Note (Signed)
Chronic, stable.  Continue current medication regimen and adjust as needed.  Denies SI/HI.  Consider change from Prozac to Zoloft in future, which would offer benefit with anxiety and depression.

## 2020-06-01 NOTE — Assessment & Plan Note (Signed)
Acute, UA negative and wet prep + for BV, recurrent issue she has seen GYN for in past.  Has underlying atrophic vaginitis.  Treat with Clindamycin cream today, which has worked well in past. Return for worsening or ongoing symptoms.

## 2020-06-01 NOTE — Assessment & Plan Note (Signed)
Chronic, ongoing.  Will trial increasing Prozac to 40 MG daily which may benefit anxiety and fibromyalgia.

## 2020-06-01 NOTE — Assessment & Plan Note (Signed)
I have recommended complete cessation of tobacco use. I have discussed various options available for assistance with tobacco cessation including over the counter methods (Nicotine gum, patch and lozenges). We also discussed prescription options (Chantix, Nicotine Inhaler / Nasal Spray). The patient is not interested in pursuing any prescription tobacco cessation options at this time.  Continue annual lung CT screening. ? ?

## 2020-06-01 NOTE — Assessment & Plan Note (Signed)
Chronic, ongoing.  Continue current medication regimen and collaboration with cardiology.  Lipid panel today. 

## 2020-06-01 NOTE — Assessment & Plan Note (Signed)
Long term benzo use.  Obtain UDS today and annually + obtain controlled substance contract next visit.

## 2020-06-01 NOTE — Assessment & Plan Note (Signed)
Chronic, ongoing.  No current inhaler regimen.  Denies any symptoms.  Continue yearly lung screening and plan on spirometry next visit to assess function.  Recommend complete cessation of smoking.   

## 2020-06-01 NOTE — Assessment & Plan Note (Signed)
Chronic, ongoing, noted on October 2019 lung screening CT.  Continue statin and ASA daily + recommend complete cessation of smoking. 

## 2020-06-01 NOTE — Progress Notes (Signed)
BP (!) 145/81 (BP Location: Left Arm, Patient Position: Sitting, Cuff Size: Normal)   Pulse 64   Temp 98.7 F (37.1 C) (Oral)   Ht 5\' 1"  (1.549 m)   Wt 143 lb (64.9 kg)   SpO2 97%   BMI 27.02 kg/m    Subjective:    Patient ID: Lindsey Bautista, female    DOB: February 12, 1961, 59 y.o.   MRN: [id number]  HPI: Lindsey Bautista is a 59 y.o. female  Chief Complaint  Patient presents with  . Depression   DEPRESSION/ANXIETY Continues on Prozac 20 MG daily and Klonopin 1 MG BID. Pt isaware of risks of benzomedication use to include increased sedation, respiratory suppression, falls, dependence and cardiovascular events.Ptwould like to continue treatment as benefit determined to outweigh risk. She inquired into whether she could increase Klonopin, discussed with her that would prefer psychiatry involved if changes in this were needed. At this time she agrees to continuing current dose.  On PDMP review last Klonopin fill 05/07/20 for #60.  No other controlled substances noted.  Three weeks ago she does endorse having some irritability the whole week -- took an extra "nerve pill a day" during that time.  Reports she is being honest about it.   Mood status:stable Satisfied with current treatment?:yes Symptom severity:mild Duration of current treatment :chronic Side effects:no Medication compliance:good compliance Psychotherapy/counseling:none Previous psychiatric medications:Prozac and Klonopin Depressed mood:no Anxious mood:yes Anhedonia:no Significant weight loss or gain:no Insomnia:yeshard to fall asleep Fatigue:at times Feelings of worthlessness or guilt: none Impaired concentration/indecisiveness:yes Suicidal ideations:no Hopelessness:no Crying spells:no Depression screen [facility] 2/9 06/01/2020 03/01/2020 10/31/2019 05/14/2019 09/02/2018  Decreased Interest 1 1 2 3 1   Down, Depressed, Hopeless 1 1 2 3 1   PHQ - 2 Score 2 2 4 6 2   Altered sleeping 0 1 1 2 2   Tired,  decreased energy 1 3 3 3 3   Change in appetite 0 0 2 3 1   Feeling bad or failure about yourself  1 1 2 3 1   Trouble concentrating 1 0 2 2 2   Moving slowly or fidgety/restless 1 1 2 3 1   Suicidal thoughts 0 0 0 0 0  PHQ-9 Score 6 8 16 22 12   Difficult doing work/chores Somewhat difficult Somewhat difficult Somewhat difficult Somewhat difficult -  Some recent data might be hidden   GAD 7 : Generalized Anxiety Score 06/01/2020 03/01/2020 10/31/2019 05/14/2019  Nervous, Anxious, on Edge 2 1 2 3   Control/stop worrying 3 1 3 3   Worry too much - different things 3 1 2 3   Trouble relaxing 3 0 1 3  Restless 1 0 1 2  Easily annoyed or irritable 3 1 3 3   Afraid - awful might happen 3 2 2 3   Total GAD 7 Score 18 6 14 20   Anxiety Difficulty Very difficult Somewhat difficult Somewhat difficult Somewhat difficult   HYPERLIPIDEMIA Currently on Zetia, last saw Dr. Rockey Situ on 05/03/20. Atorvastatin 80 MG daily and Zetia 10 MG daily.  Last lipid panel in March 2021 -- LDL 62. Hyperlipidemia status:good compliance Satisfied with current treatment?yes Side effects:no Medication compliance:good compliance Past cholesterol meds:Atorvastatin and Zetia Supplements:none Aspirin:yes The ASCVD Risk score Mikey Bussing DC Jr., et al., 2013) failed to calculate for the following reasons:   The valid total cholesterol range is 130 to 320 mg/dL Chest pain:no Coronary artery disease:no Family history CAD:no Family history early CAD:no  COPD No current inhaler regimen.  Had lung CT screening repeat today, last year noted mild centrilobular emphysema  and aortic atherosclerosis. Currently smoking 1 PPD, has smoked for 40 years.  Not interested in quitting.   COPD status: stable Satisfied with current treatment?: yes Oxygen use: no Dyspnea frequency:  Cough frequency:  Rescue inhaler frequency:   Limitation of activity: no Productive cough:  Last Spirometry: none recent Pneumovax:  refused Influenza: refused   ABDOMINAL PAIN  Reports this has been ongoing for 2 weeks -- reports it is mild.  Has a history of frequent UTI and BV infections -- only able to use Clindamycin gel 2% to treat. Last treated in March 2021.  Saw GI on 01/06/20.  Is a current smoker. Had a hysterectomy in 1992 took uterus, 1993 took right ovary, 1994 took left ovary. She is unsure if cervix is still present. No history of breast CA, endometrial CA, or ovarian CA. Has not been sexually active with her husband in several years. In July 2020 a renal study CT was done and noted moderate stool burden.  She reports her bowel pattern varies.  Last three days had had BM daily.  Takes Miralax and Colace, takes Colace every day. Miralax takes as needed.  Does strain at times when passing bowels.  Duration:weeks Onset: gradual Severity: 3/10  Quality: dull and aching Location:  lower abdomen, suprapubic Episode duration:  Radiation: none Frequency: constant Alleviating factors:  Aggravating factors: Status: fluctuating Treatments attempted: Tylenol and Ibuprofen (alternates) Fever: no Nausea: yes Vomiting: no Weight loss: no Decreased appetite: none Diarrhea: no Constipation: yes Blood in stool: no Heartburn: no Jaundice: no Rash: no Dysuria/urinary frequency: no Hematuria: no History of sexually transmitted disease: no Recurrent NSAID use: no  Relevant past medical, surgical, family and social history reviewed and updated as indicated. Interim medical history since our last visit reviewed. Allergies and medications reviewed and updated.  Review of Systems  Constitutional: Negative for activity change, appetite change, diaphoresis, fatigue and fever.  Respiratory: Negative for cough, chest tightness, shortness of breath and wheezing.   Cardiovascular: Negative for chest pain, palpitations and leg swelling.  Gastrointestinal: Positive for abdominal pain and nausea. Negative for abdominal  distention, blood in stool, constipation, diarrhea and vomiting.  Endocrine: Negative for cold intolerance and heat intolerance.  Neurological: Negative for dizziness, syncope, weakness, light-headedness, numbness and headaches.  Psychiatric/Behavioral: Negative for decreased concentration, self-injury, sleep disturbance and suicidal ideas. The patient is nervous/anxious.     Per HPI unless specifically indicated above     Objective:    BP (!) 145/81 (BP Location: Left Arm, Patient Position: Sitting, Cuff Size: Normal)   Pulse 64   Temp 98.7 F (37.1 C) (Oral)   Ht 5\' 1"  (1.549 m)   Wt 143 lb (64.9 kg)   SpO2 97%   BMI 27.02 kg/m   Wt Readings from Last 3 Encounters:  06/01/20 143 lb (64.9 kg)  05/03/20 141 lb 6 oz (64.1 kg)  04/19/20 140 lb (63.5 kg)    Physical Exam Vitals and nursing note reviewed.  Constitutional:      General: She is awake. She is not in acute distress.    Appearance: She is well-developed, well-groomed and overweight. She is not ill-appearing.  HENT:     Head: Normocephalic.     Right Ear: Hearing normal.     Left Ear: Hearing normal.  Eyes:     General: Lids are normal.        Right eye: No discharge.        Left eye: No discharge.     Conjunctiva/sclera:  Conjunctivae normal.     Pupils: Pupils are equal, round, and reactive to light.  Neck:     Thyroid: No thyromegaly.     Vascular: No carotid bruit.  Cardiovascular:     Rate and Rhythm: Normal rate and regular rhythm.     Heart sounds: Normal heart sounds. No murmur heard.  No gallop.   Pulmonary:     Effort: Pulmonary effort is normal. No accessory muscle usage or respiratory distress.     Breath sounds: Normal breath sounds.  Abdominal:     General: Bowel sounds are normal. There is no distension or abdominal bruit.     Palpations: Abdomen is soft. There is no hepatomegaly or splenomegaly.     Tenderness: There is no abdominal tenderness. There is no right CVA tenderness, left CVA  tenderness, guarding or rebound.     Hernia: No hernia is present.  Musculoskeletal:     Cervical back: Normal range of motion and neck supple.     Right lower leg: No edema.     Left lower leg: No edema.  Skin:    General: Skin is warm and dry.  Neurological:     Mental Status: She is alert and oriented to person, place, and time.  Psychiatric:        Attention and Perception: Attention normal.        Mood and Affect: Mood normal.        Behavior: Behavior normal. Behavior is cooperative.        Thought Content: Thought content normal.        Judgment: Judgment normal.    Results for orders placed or performed in visit on 03/04/20  HM COLONOSCOPY  Result Value Ref Range   HM Colonoscopy See Report (in chart) See Report (in chart), Patient Reported      Assessment & Plan:   Problem List Items Addressed This Visit      Cardiovascular and Mediastinum   Aortic atherosclerosis ([place])    Chronic, ongoing, noted on October 2019 lung screening CT.  Continue statin and ASA daily + recommend complete cessation of smoking.      Relevant Medications   ezetimibe (ZETIA) 10 MG tablet     Respiratory   Centrilobular emphysema (HCC) - Primary    Chronic, ongoing.  No current inhaler regimen.  Denies any symptoms.  Continue yearly lung screening and plan on spirometry next visit to assess function.  Recommend complete cessation of smoking.        Digestive   Gastroesophageal reflux disease without esophagitis    Chronic, well controlled at this time.  Check TSH today and continue current regimen.      Relevant Medications   linaclotide (LINZESS) 72 MCG capsule   Other Relevant Orders   TSH     Other   Nicotine dependence, cigarettes, uncomplicated    I have recommended complete cessation of tobacco use. I have discussed various options available for assistance with tobacco cessation including over the counter methods (Nicotine gum, patch and lozenges). We also discussed  prescription options (Chantix, Nicotine Inhaler / Nasal Spray). The patient is not interested in pursuing any prescription tobacco cessation options at this time.  Continue annual lung CT screening.       Depression, recurrent (HCC)    Chronic, stable.  Continue current medication regimen and adjust as needed.  Denies SI/HI.  Consider change from Prozac to Zoloft in future, which would offer benefit with anxiety and depression.  Relevant Medications   FLUoxetine (PROZAC) 40 MG capsule   High risk medication use    Long term benzo use.  Obtain UDS today and annually + obtain controlled substance contract next visit.      Relevant Orders   Urine drugs of abuse scrn w alc, routine (Ref Lab)   Hyperlipidemia    Chronic, ongoing.  Continue current medication regimen and collaboration with cardiology.  Lipid panel today.      Relevant Medications   ezetimibe (ZETIA) 10 MG tablet   Other Relevant Orders   Lipid Panel w/o Chol/HDL Ratio   Comprehensive metabolic panel   Anxiety    Chronic, ongoing.  Continue Klonopin at this time, patient educated on risks of medication and is not interested in reducing.  Increase Prozac 40 MG which may benefit anxiety and fibromyalgia, discussed with her changing to Zoloft or Duloxetine if increase anxiety/depression noted.  At this time she denies SI/HI and wishes to maintain current regimen.  UDS today and contract next visit.      Relevant Medications   FLUoxetine (PROZAC) 40 MG capsule   clonazePAM (KLONOPIN) 1 MG tablet   Other Relevant Orders   Urine drugs of abuse scrn w alc, routine (Ref Lab)   TSH   Fibromyalgia    Chronic, ongoing.  Will trial increasing Prozac to 40 MG daily which may benefit anxiety and fibromyalgia.      Relevant Medications   FLUoxetine (PROZAC) 40 MG capsule   clonazePAM (KLONOPIN) 1 MG tablet   Generalized abdominal pain    Acute, UA negative and wet prep + for BV, recurrent issue she has seen GYN for in past.   Has underlying atrophic vaginitis.  Treat with Clindamycin cream today, which has worked well in past. Return for worsening or ongoing symptoms.      Relevant Orders   UA/M w/rflx Culture, Routine   WET PREP FOR TRICH, YEAST, CLUE   CBC with Differential/Platelet   TSH       Follow up plan: Return in about 3 months (around 09/01/2020) for Anxiety.

## 2020-06-02 ENCOUNTER — Telehealth: Payer: Self-pay | Admitting: *Deleted

## 2020-06-02 LAB — COMPREHENSIVE METABOLIC PANEL
ALT: 13 IU/L (ref 0–32)
AST: 17 IU/L (ref 0–40)
Albumin/Globulin Ratio: 1.9 (ref 1.2–2.2)
Albumin: 4.4 g/dL (ref 3.8–4.9)
Alkaline Phosphatase: 86 IU/L (ref 44–121)
BUN/Creatinine Ratio: 10 (ref 9–23)
BUN: 9 mg/dL (ref 6–24)
Bilirubin Total: 0.4 mg/dL (ref 0.0–1.2)
CO2: 27 mmol/L (ref 20–29)
Calcium: 9.5 mg/dL (ref 8.7–10.2)
Chloride: 102 mmol/L (ref 96–106)
Creatinine, Ser: 0.9 mg/dL (ref 0.57–1.00)
GFR calc Af Amer: 81 mL/min/{1.73_m2} (ref >59)
GFR calc non Af Amer: 70 mL/min/{1.73_m2} (ref >59)
Globulin, Total: 2.3 g/dL (ref 1.5–4.5)
Glucose: 77 mg/dL (ref 65–99)
Potassium: 4.6 mmol/L (ref 3.5–5.2)
Sodium: 139 mmol/L (ref 134–144)
Total Protein: 6.7 g/dL (ref 6.0–8.5)

## 2020-06-02 LAB — CBC WITH DIFFERENTIAL/PLATELET
Basophils Absolute: 0 10*3/uL (ref 0.0–0.2)
Basos: 1 %
EOS (ABSOLUTE): 0.1 10*3/uL (ref 0.0–0.4)
Eos: 1 %
Hematocrit: 40.6 % (ref 34.0–46.6)
Hemoglobin: 13.3 g/dL (ref 11.1–15.9)
Immature Grans (Abs): 0 10*3/uL (ref 0.0–0.1)
Immature Granulocytes: 0 %
Lymphocytes Absolute: 2.7 10*3/uL (ref 0.7–3.1)
Lymphs: 46 %
MCH: 29.8 pg (ref 26.6–33.0)
MCHC: 32.8 g/dL (ref 31.5–35.7)
MCV: 91 fL (ref 79–97)
Monocytes Absolute: 0.4 10*3/uL (ref 0.1–0.9)
Monocytes: 7 %
Neutrophils Absolute: 2.6 10*3/uL (ref 1.4–7.0)
Neutrophils: 45 %
Platelets: 268 10*3/uL (ref 150–450)
RBC: 4.47 x10E6/uL (ref 3.77–5.28)
RDW: 12.2 % (ref 11.7–15.4)
WBC: 5.8 10*3/uL (ref 3.4–10.8)

## 2020-06-02 LAB — LIPID PANEL W/O CHOL/HDL RATIO
Cholesterol, Total: 139 mg/dL (ref 100–199)
HDL: 53 mg/dL (ref >39)
LDL Chol Calc (NIH): 75 mg/dL (ref 0–99)
Triglycerides: 51 mg/dL (ref 0–149)
VLDL Cholesterol Cal: 11 mg/dL (ref 5–40)

## 2020-06-02 LAB — TSH: TSH: 1.37 u[IU]/mL (ref 0.450–4.500)

## 2020-06-02 NOTE — Progress Notes (Signed)
Please let Lindsey Bautista know all of her labs have returned and are within normal ranges.  No medication changes needed at this time.  Keep up the good work.   Keep being awesome!!  Thank you for allowing me to participate in your care. Kindest regards, Kierre Hintz

## 2020-06-02 NOTE — Telephone Encounter (Signed)

## 2020-06-02 NOTE — Telephone Encounter (Signed)
Noted, thank you

## 2020-06-03 LAB — URINE DRUGS OF ABUSE SCREEN W ALC, ROUTINE (REF LAB)
Amphetamines, Urine: NEGATIVE ng/mL
Barbiturate Quant, Ur: NEGATIVE ng/mL
Benzodiazepine Quant, Ur: NEGATIVE ng/mL
Cannabinoid Quant, Ur: NEGATIVE ng/mL
Cocaine (Metab.): NEGATIVE ng/mL
Ethanol, Urine: NEGATIVE %
Methadone Screen, Urine: NEGATIVE ng/mL
Opiate Quant, Ur: NEGATIVE ng/mL
PCP Quant, Ur: NEGATIVE ng/mL
Propoxyphene: NEGATIVE ng/mL

## 2020-06-04 ENCOUNTER — Ambulatory Visit: Payer: Medicare Other

## 2020-06-06 DIAGNOSIS — Z23 Encounter for immunization: Secondary | ICD-10-CM | POA: Diagnosis not present

## 2020-07-26 ENCOUNTER — Other Ambulatory Visit: Payer: Self-pay | Admitting: Nurse Practitioner

## 2020-07-26 MED ORDER — PANTOPRAZOLE SODIUM 40 MG PO TBEC
40.0000 mg | DELAYED_RELEASE_TABLET | Freq: Every day | ORAL | 4 refills | Status: DC
Start: 2020-07-26 — End: 2020-12-27

## 2020-07-26 NOTE — Telephone Encounter (Signed)
Routing to provider  

## 2020-07-26 NOTE — Telephone Encounter (Signed)
  Notes to clinic:  medication filled by historical provider  Review for refill Patient needs asap    Requested Prescriptions  Pending Prescriptions Disp Refills   pantoprazole (PROTONIX) 40 MG tablet      Sig: Take 1 tablet (40 mg total) by mouth daily.      Gastroenterology: Proton Pump Inhibitors Passed - 07/26/2020 10:57 AM      Passed - Valid encounter within last 12 months    Recent Outpatient Visits           1 month ago Centrilobular emphysema ([place])   [place], [place] T, NP   3 months ago Sinus pressure   [place], [place] T, NP   4 months ago Depression, recurrent ([place])   [place], Jolene T, NP   8 months ago Depression, recurrent ([place])   [place], Jolene T, NP   10 months ago Generalized abdominal pain   [place], Barbaraann Faster, NP

## 2020-07-26 NOTE — Telephone Encounter (Signed)
Medication Refill - Medication: pantoprazole (PROTONIX) 40 MG tablet (Patient would like request expeditied. Patient stated that pharmacy has tried reaching out mulitple times to get refills sent back over . Please advise )   Has the patient contacted their pharmacy?yes (Agent: If no, request that the patient contact the pharmacy for the refill.) (Agent: If yes, when and what did the pharmacy advise?)Contact pcp  Preferred Pharmacy (with phone number or street name):  [facility] [place] [facility] [address] Phone:  [phone]  Fax:  [phone]       Agent: Please be advised that RX refills may take up to 3 business days. We ask that you follow-up with your pharmacy.

## 2020-08-05 ENCOUNTER — Other Ambulatory Visit: Payer: Self-pay | Admitting: Nurse Practitioner

## 2020-08-11 ENCOUNTER — Other Ambulatory Visit: Payer: Self-pay

## 2020-08-11 ENCOUNTER — Ambulatory Visit (INDEPENDENT_AMBULATORY_CARE_PROVIDER_SITE_OTHER): Payer: Medicare Other | Admitting: Nurse Practitioner

## 2020-08-11 ENCOUNTER — Encounter: Payer: Self-pay | Admitting: Nurse Practitioner

## 2020-08-11 VITALS — BP 115/66 | HR 66 | Temp 98.2°F | Ht 61.34 in | Wt 143.6 lb

## 2020-08-11 DIAGNOSIS — K2270 Barrett's esophagus without dysplasia: Secondary | ICD-10-CM | POA: Diagnosis not present

## 2020-08-11 DIAGNOSIS — R1084 Generalized abdominal pain: Secondary | ICD-10-CM | POA: Diagnosis not present

## 2020-08-11 DIAGNOSIS — I6521 Occlusion and stenosis of right carotid artery: Secondary | ICD-10-CM

## 2020-08-11 NOTE — Assessment & Plan Note (Signed)
Acute, UA negative with exception of trace BLD and wet prep negative today.  Patient reassured and recommend she increase fluid intake at home + take daily Vitamin C tablet.  Return to office for worsening or ongoing symptoms.

## 2020-08-11 NOTE — Assessment & Plan Note (Signed)
Chronic, ongoing with reported poor control.  Recommend she follow-up with Dr. Alice Reichert due to ongoing symptoms since her EGD, may benefit from short period increase in Protonix to twice a day dosing.  Recommend cessation of smoking and cut back on Ibuprofen use which can cause further irritation of Barrett's esophagus.  Return in January.

## 2020-08-11 NOTE — Patient Instructions (Signed)

## 2020-08-11 NOTE — Progress Notes (Signed)
BP 115/66   Pulse 66   Temp 98.2 F (36.8 C) (Oral)   Ht 5' 1.34" (1.558 m)   Wt 143 lb 9.6 oz (65.1 kg)   SpO2 98%   BMI 26.83 kg/m    Subjective:    Patient ID: Lindsey Bautista, female    DOB: September 29, 1960, 59 y.o.   MRN: [id number]  HPI: Lindsey Bautista is a 59 y.o. female  Chief Complaint  Patient presents with  . Urinary Frequency    With odor for the past several days  . Abdominal Pain   ABDOMINAL PAIN  Reports this has been ongoing for 2 weeks -- reports it is mild.  Has a history of frequent UTI and BV infections -- only able to use Clindamycin gel 2% to treat. Last treated in 06/01/2020.  She reports increased reflux has been ongoing since EGD -- taking Protonix once a day.  EGD in July did note Barrett's esophagus -- repeat in 3 year EGD recommended.  Occasional Ibuprofen at home.  Is a current smoker. Had a hysterectomy in 1992 took uterus, 1993 took right ovary, 1994 took left ovary. She is unsure if cervix is still present. No history of breast CA, endometrial CA, or ovarian CA. Has not been sexually active with her husband in several years. Duration:weeks Onset: gradual Severity: 3/10  Quality: dull and aching Location: epigastric Episode duration:  Radiation: none Frequency: constant Alleviating factors:  Aggravating factors: Status: fluctuating Treatments attempted: Tylenol and Ibuprofen (alternates) Fever: no Nausea: yes Vomiting: no Weight loss: no Decreased appetite: none Diarrhea: no Constipation: yes Blood in stool: no Heartburn: no Jaundice: no Rash: no Dysuria/urinary frequency: no Hematuria: no History of sexually transmitted disease: no Recurrent NSAID use: no  Relevant past medical, surgical, family and social history reviewed and updated as indicated. Interim medical history since our last visit reviewed. Allergies and medications reviewed and updated.  Review of Systems  Constitutional: Negative for activity change, appetite  change, diaphoresis, fatigue and fever.  Respiratory: Negative for cough, chest tightness, shortness of breath and wheezing.   Cardiovascular: Negative for chest pain, palpitations and leg swelling.  Gastrointestinal: Positive for abdominal pain. Negative for abdominal distention, blood in stool, constipation, diarrhea, nausea and vomiting.  Endocrine: Negative for cold intolerance and heat intolerance.  Neurological: Negative for dizziness, syncope, weakness, light-headedness, numbness and headaches.  Psychiatric/Behavioral: Negative for decreased concentration, self-injury, sleep disturbance and suicidal ideas. The patient is nervous/anxious.     Per HPI unless specifically indicated above     Objective:    BP 115/66   Pulse 66   Temp 98.2 F (36.8 C) (Oral)   Ht 5' 1.34" (1.558 m)   Wt 143 lb 9.6 oz (65.1 kg)   SpO2 98%   BMI 26.83 kg/m   Wt Readings from Last 3 Encounters:  08/11/20 143 lb 9.6 oz (65.1 kg)  06/01/20 143 lb (64.9 kg)  06/01/20 143 lb (64.9 kg)    Physical Exam Vitals and nursing note reviewed.  Constitutional:      General: She is awake. She is not in acute distress.    Appearance: She is well-developed, well-groomed and overweight. She is not ill-appearing.  HENT:     Head: Normocephalic.     Right Ear: Hearing normal.     Left Ear: Hearing normal.  Eyes:     General: Lids are normal.        Right eye: No discharge.        Left  eye: No discharge.     Conjunctiva/sclera: Conjunctivae normal.     Pupils: Pupils are equal, round, and reactive to light.  Neck:     Thyroid: No thyromegaly.     Vascular: No carotid bruit.  Cardiovascular:     Rate and Rhythm: Normal rate and regular rhythm.     Heart sounds: Normal heart sounds. No murmur heard. No gallop.   Pulmonary:     Effort: Pulmonary effort is normal. No accessory muscle usage or respiratory distress.     Breath sounds: Normal breath sounds.  Abdominal:     General: Bowel sounds are normal.  There is no distension or abdominal bruit.     Palpations: Abdomen is soft. There is no hepatomegaly or splenomegaly.     Tenderness: There is no abdominal tenderness. There is no right CVA tenderness, left CVA tenderness, guarding or rebound.     Hernia: No hernia is present.  Musculoskeletal:     Cervical back: Normal range of motion and neck supple.     Right lower leg: No edema.     Left lower leg: No edema.  Skin:    General: Skin is warm and dry.  Neurological:     Mental Status: She is alert and oriented to person, place, and time.  Psychiatric:        Attention and Perception: Attention normal.        Mood and Affect: Mood normal.        Behavior: Behavior normal. Behavior is cooperative.        Thought Content: Thought content normal.        Judgment: Judgment normal.    Results for orders placed or performed in visit on 06/01/20  WET PREP FOR [place], YEAST, CLUE   Specimen: Vaginal; Sterile Swab   Sterile Swab  Result Value Ref Range   Trichomonas Exam Negative Negative   Yeast Exam Negative Negative   Clue Cell Exam Positive (A) Negative  UA/M w/rflx Culture, Routine   Specimen: Urine   Urine  Result Value Ref Range   Specific Gravity, UA 1.015 1.005 - 1.030   pH, UA 6.0 5.0 - 7.5   Color, UA Yellow Yellow   Appearance Ur Clear Clear   Leukocytes,UA Negative Negative   Protein,UA Negative Negative/Trace   Glucose, UA Negative Negative   Ketones, UA Negative Negative   RBC, UA Negative Negative   Bilirubin, UA Negative Negative   Urobilinogen, Ur 0.2 0.2 - 1.0 mg/dL   Nitrite, UA Negative Negative  Urine drugs of abuse scrn w alc, routine (Ref Lab)  Result Value Ref Range   Amphetamines, Urine Negative Cutoff=1000 ng/mL   Barbiturate Quant, Ur Negative Cutoff=300 ng/mL   Benzodiazepine Quant, Ur Negative Cutoff=300 ng/mL   Cannabinoid Quant, Ur Negative Cutoff=50 ng/mL   Cocaine (Metab.) Negative Cutoff=300 ng/mL   Opiate Quant, Ur Negative Cutoff=300 ng/mL    PCP Quant, Ur Negative Cutoff=25 ng/mL   Methadone Screen, Urine Negative Cutoff=300 ng/mL   Propoxyphene Negative Cutoff=300 ng/mL   Ethanol, Urine Negative Cutoff=0.020 %  Lipid Panel w/o Chol/HDL Ratio  Result Value Ref Range   Cholesterol, Total 139 100 - 199 mg/dL   Triglycerides 51 0 - 149 mg/dL   HDL 53 >39 mg/dL   VLDL Cholesterol Cal 11 5 - 40 mg/dL   LDL Chol Calc (NIH) 75 0 - 99 mg/dL  Comprehensive metabolic panel  Result Value Ref Range   Glucose 77 65 - 99 mg/dL   BUN  9 6 - 24 mg/dL   Creatinine, Ser 0.90 0.57 - 1.00 mg/dL   GFR calc non Af Amer 70 >59 mL/min/1.73   GFR calc Af Amer 81 >59 mL/min/1.73   BUN/Creatinine Ratio 10 9 - 23   Sodium 139 134 - 144 mmol/L   Potassium 4.6 3.5 - 5.2 mmol/L   Chloride 102 96 - 106 mmol/L   CO2 27 20 - 29 mmol/L   Calcium 9.5 8.7 - 10.2 mg/dL   Total Protein 6.7 6.0 - 8.5 g/dL   Albumin 4.4 3.8 - 4.9 g/dL   Globulin, Total 2.3 1.5 - 4.5 g/dL   Albumin/Globulin Ratio 1.9 1.2 - 2.2   Bilirubin Total 0.4 0.0 - 1.2 mg/dL   Alkaline Phosphatase 86 44 - 121 IU/L   AST 17 0 - 40 IU/L   ALT 13 0 - 32 IU/L  CBC with Differential/Platelet  Result Value Ref Range   WBC 5.8 3.4 - 10.8 x10E3/uL   RBC 4.47 3.77 - 5.28 x10E6/uL   Hemoglobin 13.3 11.1 - 15.9 g/dL   Hematocrit 40.6 34.0 - 46.6 %   MCV 91 79 - 97 fL   MCH 29.8 26.6 - 33.0 pg   MCHC 32.8 31.5 - 35.7 g/dL   RDW 12.2 11.7 - 15.4 %   Platelets 268 150 - 450 x10E3/uL   Neutrophils 45 Not Estab. %   Lymphs 46 Not Estab. %   Monocytes 7 Not Estab. %   Eos 1 Not Estab. %   Basos 1 Not Estab. %   Neutrophils Absolute 2.6 1.4 - 7.0 x10E3/uL   Lymphocytes Absolute 2.7 0.7 - 3.1 x10E3/uL   Monocytes Absolute 0.4 0.1 - 0.9 x10E3/uL   EOS (ABSOLUTE) 0.1 0.0 - 0.4 x10E3/uL   Basophils Absolute 0.0 0.0 - 0.2 x10E3/uL   Immature Granulocytes 0 Not Estab. %   Immature Grans (Abs) 0.0 0.0 - 0.1 x10E3/uL  TSH  Result Value Ref Range   TSH 1.370 0.450 - 4.500 uIU/mL       Assessment & Plan:   Problem List Items Addressed This Visit      Digestive   Barrett's esophagus    Chronic, ongoing with reported poor control.  Recommend she follow-up with Dr. Alice Reichert due to ongoing symptoms since her EGD, may benefit from short period increase in Protonix to twice a day dosing.  Recommend cessation of smoking and cut back on Ibuprofen use which can cause further irritation of Barrett's esophagus.  Return in January.        Other   Generalized abdominal pain - Primary    Acute, UA negative with exception of trace BLD and wet prep negative today.  Patient reassured and recommend she increase fluid intake at home + take daily Vitamin C tablet.  Return to office for worsening or ongoing symptoms.      Relevant Orders   Urinalysis, Routine w reflex microscopic   WET PREP FOR TRICH, YEAST, CLUE       Follow up plan: Return for should be scheduled January for refills.

## 2020-08-12 LAB — URINALYSIS, ROUTINE W REFLEX MICROSCOPIC
Bilirubin, UA: NEGATIVE
Glucose, UA: NEGATIVE
Ketones, UA: NEGATIVE
Leukocytes,UA: NEGATIVE
Nitrite, UA: NEGATIVE
Protein,UA: NEGATIVE
Specific Gravity, UA: 1.025 (ref 1.005–1.030)
Urobilinogen, Ur: 1 mg/dL (ref 0.2–1.0)
pH, UA: 5.5 (ref 5.0–7.5)

## 2020-08-12 LAB — MICROSCOPIC EXAMINATION: WBC, UA: NONE SEEN /hpf (ref 0–5)

## 2020-08-12 LAB — WET PREP FOR TRICH, YEAST, CLUE
Clue Cell Exam: NEGATIVE
Trichomonas Exam: NEGATIVE
Yeast Exam: NEGATIVE

## 2020-09-02 ENCOUNTER — Other Ambulatory Visit: Payer: Self-pay | Admitting: Nurse Practitioner

## 2020-09-02 DIAGNOSIS — F419 Anxiety disorder, unspecified: Secondary | ICD-10-CM

## 2020-09-02 NOTE — Telephone Encounter (Signed)
Requested medication (s) are due for refill today:  no  Requested medication (s) are on the active medication list:  yes  Last refill:  08/05/2020  Future visit scheduled: no  Notes to clinic:  this refill cannot be delegated    Requested Prescriptions  Pending Prescriptions Disp Refills   clonazePAM (KLONOPIN) 1 MG tablet [Pharmacy Med Name: clonazePAM 1 MG Oral Tablet] 60 tablet 0    Sig: TAKE 1 TABLET BY MOUTH TWICE DAILY .      Not Delegated - Psychiatry:  Anxiolytics/Hypnotics Failed - 09/02/2020 10:29 AM      Failed - This refill cannot be delegated      Failed - Urine Drug Screen completed in last 360 days      Passed - Valid encounter within last 6 months    Recent Outpatient Visits           3 weeks ago Generalized abdominal pain   [place], Henrine Screws T, NP   3 months ago Centrilobular emphysema ([place])   [place], [place] T, NP   4 months ago Sinus pressure   [place], [place] T, NP   6 months ago Depression, recurrent ([place])   [place], Jolene T, NP   10 months ago Depression, recurrent ([place])   [place], Barbaraann Faster, NP

## 2020-09-07 NOTE — Telephone Encounter (Signed)
Pt sated she would call back to make this apt.

## 2020-09-27 ENCOUNTER — Ambulatory Visit (INDEPENDENT_AMBULATORY_CARE_PROVIDER_SITE_OTHER): Payer: Medicare Other

## 2020-09-27 VITALS — Ht 61.0 in | Wt 140.0 lb

## 2020-09-27 DIAGNOSIS — Z0000 Encounter for general adult medical examination without abnormal findings: Secondary | ICD-10-CM

## 2020-09-27 NOTE — Patient Instructions (Signed)
Lindsey Bautista , Thank you for taking time to come for your Medicare Wellness Visit. I appreciate your ongoing commitment to your health goals. Please review the following plan we discussed and let me know if I can assist you in the future.   Screening recommendations/referrals: Colonoscopy: completed 02/13/2020, due 02/12/2025 Mammogram: patient to schedule Bone Density: n/a Recommended yearly ophthalmology/optometry visit for glaucoma screening and checkup Recommended yearly dental visit for hygiene and checkup  Vaccinations: Influenza vaccine: decline Pneumococcal vaccine: n/a Tdap vaccine: due Shingles vaccine:  discussed  Covid-19:  05/09/2020, 06/06/2020  Advanced directives: Advance directive discussed with you today.   Conditions/risks identified: smoking  Next appointment: Follow up in one year for your annual wellness visit.   Preventive Care 40-64 Years, Female Preventive care refers to lifestyle choices and visits with your health care provider that can promote health and wellness. What does preventive care include?  A yearly physical exam. This is also called an annual well check.  Dental exams once or twice a year.  Routine eye exams. Ask your health care provider how often you should have your eyes checked.  Personal lifestyle choices, including:  Daily care of your teeth and gums.  Regular physical activity.  Eating a healthy diet.  Avoiding tobacco and drug use.  Limiting alcohol use.  Practicing safe sex.  Taking low-dose aspirin daily starting at age 24.  Taking vitamin and mineral supplements as recommended by your health care provider. What happens during an annual well check? The services and screenings done by your health care provider during your annual well check will depend on your age, overall health, lifestyle risk factors, and family history of disease. Counseling  Your health care provider may ask you questions about your:  Alcohol  use.  Tobacco use.  Drug use.  Emotional well-being.  Home and relationship well-being.  Sexual activity.  Eating habits.  Work and work Statistician.  Method of birth control.  Menstrual cycle.  Pregnancy history. Screening  You may have the following tests or measurements:  Height, weight, and BMI.  Blood pressure.  Lipid and cholesterol levels. These may be checked every 5 years, or more frequently if you are over 84 years old.  Skin check.  Lung cancer screening. You may have this screening every year starting at age 18 if you have a 30-pack-year history of smoking and currently smoke or have quit within the past 15 years.  Fecal occult blood test (FOBT) of the stool. You may have this test every year starting at age 61.  Flexible sigmoidoscopy or colonoscopy. You may have a sigmoidoscopy every 5 years or a colonoscopy every 10 years starting at age 41.  Hepatitis C blood test.  Hepatitis B blood test.  Sexually transmitted disease (STD) testing.  Diabetes screening. This is done by checking your blood sugar (glucose) after you have not eaten for a while (fasting). You may have this done every 1-3 years.  Mammogram. This may be done every 1-2 years. Talk to your health care provider about when you should start having regular mammograms. This may depend on whether you have a family history of breast cancer.  BRCA-related cancer screening. This may be done if you have a family history of breast, ovarian, tubal, or peritoneal cancers.  Pelvic exam and Pap test. This may be done every 3 years starting at age 72. Starting at age 19, this may be done every 5 years if you have a Pap test in combination with an HPV  test.  Bone density scan. This is done to screen for osteoporosis. You may have this scan if you are at high risk for osteoporosis. Discuss your test results, treatment options, and if necessary, the need for more tests with your health care  provider. Vaccines  Your health care provider may recommend certain vaccines, such as:  Influenza vaccine. This is recommended every year.  Tetanus, diphtheria, and acellular pertussis (Tdap, Td) vaccine. You may need a Td booster every 10 years.  Zoster vaccine. You may need this after age 85.  Pneumococcal 13-valent conjugate (PCV13) vaccine. You may need this if you have certain conditions and were not previously vaccinated.  Pneumococcal polysaccharide (PPSV23) vaccine. You may need one or two doses if you smoke cigarettes or if you have certain conditions. Talk to your health care provider about which screenings and vaccines you need and how often you need them. This information is not intended to replace advice given to you by your health care provider. Make sure you discuss any questions you have with your health care provider. Document Released: 09/03/2015 Document Revised: 04/26/2016 Document Reviewed: 06/08/2015 Elsevier Interactive Patient Education  2017 [employer] Prevention in the Home Falls can cause injuries. They can happen to people of all ages. There are many things you can do to make your home safe and to help prevent falls. What can I do on the outside of my home?  Regularly fix the edges of walkways and driveways and fix any cracks.  Remove anything that might make you trip as you walk through a door, such as a raised step or threshold.  Trim any bushes or trees on the path to your home.  Use bright outdoor lighting.  Clear any walking paths of anything that might make someone trip, such as rocks or tools.  Regularly check to see if handrails are loose or broken. Make sure that both sides of any steps have handrails.  Any raised decks and porches should have guardrails on the edges.  Have any leaves, snow, or ice cleared regularly.  Use sand or salt on walking paths during winter.  Clean up any spills in your garage right away. This includes  oil or grease spills. What can I do in the bathroom?  Use night lights.  Install grab bars by the toilet and in the tub and shower. Do not use towel bars as grab bars.  Use non-skid mats or decals in the tub or shower.  If you need to sit down in the shower, use a plastic, non-slip stool.  Keep the floor dry. Clean up any water that spills on the floor as soon as it happens.  Remove soap buildup in the tub or shower regularly.  Attach bath mats securely with double-sided non-slip rug tape.  Do not have throw rugs and other things on the floor that can make you trip. What can I do in the bedroom?  Use night lights.  Make sure that you have a light by your bed that is easy to reach.  Do not use any sheets or blankets that are too big for your bed. They should not hang down onto the floor.  Have a firm chair that has side arms. You can use this for support while you get dressed.  Do not have throw rugs and other things on the floor that can make you trip. What can I do in the kitchen?  Clean up any spills right away.  Avoid  walking on wet floors.  Keep items that you use a lot in easy-to-reach places.  If you need to reach something above you, use a strong step stool that has a grab bar.  Keep electrical cords out of the way.  Do not use floor polish or wax that makes floors slippery. If you must use wax, use non-skid floor wax.  Do not have throw rugs and other things on the floor that can make you trip. What can I do with my stairs?  Do not leave any items on the stairs.  Make sure that there are handrails on both sides of the stairs and use them. Fix handrails that are broken or loose. Make sure that handrails are as long as the stairways.  Check any carpeting to make sure that it is firmly attached to the stairs. Fix any carpet that is loose or worn.  Avoid having throw rugs at the top or bottom of the stairs. If you do have throw rugs, attach them to the floor  with carpet tape.  Make sure that you have a light switch at the top of the stairs and the bottom of the stairs. If you do not have them, ask someone to add them for you. What else can I do to help prevent falls?  Wear shoes that:  Do not have high heels.  Have rubber bottoms.  Are comfortable and fit you well.  Are closed at the toe. Do not wear sandals.  If you use a stepladder:  Make sure that it is fully opened. Do not climb a closed stepladder.  Make sure that both sides of the stepladder are locked into place.  Ask someone to hold it for you, if possible.  Clearly mark and make sure that you can see:  Any grab bars or handrails.  First and last steps.  Where the edge of each step is.  Use tools that help you move around (mobility aids) if they are needed. These include:  Canes.  Walkers.  Scooters.  Crutches.  Turn on the lights when you go into a dark area. Replace any light bulbs as soon as they burn out.  Set up your furniture so you have a clear path. Avoid moving your furniture around.  If any of your floors are uneven, fix them.  If there are any pets around you, be aware of where they are.  Review your medicines with your doctor. Some medicines can make you feel dizzy. This can increase your chance of falling. Ask your doctor what other things that you can do to help prevent falls. This information is not intended to replace advice given to you by your health care provider. Make sure you discuss any questions you have with your health care provider. Document Released: 06/03/2009 Document Revised: 01/13/2016 Document Reviewed: 09/11/2014 Elsevier Interactive Patient Education  2017 [employer].

## 2020-09-27 NOTE — Progress Notes (Signed)
I connected with Lindsey Bautista today by telephone and verified that I am speaking with the correct person using two identifiers. Location patient: home Location provider: work Persons participating in the virtual visit: Ammarie, Rund LPN.   I discussed the limitations, risks, security and privacy concerns of performing an evaluation and management service by telephone and the availability of in person appointments. I also discussed with the patient that there may be a patient responsible charge related to this service. The patient expressed understanding and verbally consented to this telephonic visit.    Interactive audio and video telecommunications were attempted between this provider and patient, however failed, due to patient having technical difficulties OR patient did not have access to video capability.  We continued and completed visit with audio only.     Vital signs may be patient reported or missing.  Subjective:   Lindsey Bautista is a 60 y.o. female who presents for Medicare Annual (Subsequent) preventive examination.  Review of Systems     Cardiac Risk Factors include: dyslipidemia;sedentary lifestyle;smoking/ tobacco exposure     Objective:    Today's Vitals   09/27/20 1341 09/27/20 1342  Weight: 140 lb (63.5 kg)   Height: 5\' 1"  (1.549 m)   PainSc:  5    Body mass index is 26.45 kg/m.  Advanced Directives 09/27/2020 12/17/2019 11/22/2019 11/20/2019 06/06/2019 05/25/2019 05/09/2019  Does Patient Have a Medical Advance Directive? No No No No No No No  Does patient want to make changes to medical advance directive? - - - - - - -  Would patient like information on creating a medical advance directive? - No - Patient declined No - Patient declined - - No - Patient declined -    Current Medications (verified) Outpatient Encounter Medications as of 09/27/2020  Medication Sig  . aspirin 81 MG tablet Take 81 mg by mouth daily.  [name] atorvastatin (LIPITOR) 80 MG tablet  Take 1 tablet (80 mg total) by mouth daily.  . clonazePAM (KLONOPIN) 1 MG tablet Take 1 tablet by mouth twice daily  . docusate sodium (COLACE) 100 MG capsule Take 100 mg by mouth daily.   [name] EPINEPHrine 0.3 mg/0.3 mL IJ SOAJ injection Inject 0.3 mLs (0.3 mg total) into the muscle as needed for anaphylaxis.  [name] estradiol (ESTRACE) 0.5 MG tablet Take 1 tablet by mouth once daily  . FLUoxetine (PROZAC) 40 MG capsule Take 1 capsule (40 mg total) by mouth daily.  [name] linaclotide (LINZESS) 72 MCG capsule TAKE 1 CAPSULE BY MOUTH ONCE DAILY BEFORE BREAKFAST  . nitroGLYCERIN (NITROSTAT) 0.4 MG SL tablet Place 1 tablet (0.4 mg total) under the tongue every 5 (five) minutes as needed for chest pain.  . pantoprazole (PROTONIX) 40 MG tablet Take 1 tablet (40 mg total) by mouth daily.  [name] ezetimibe (ZETIA) 10 MG tablet Take 1 tablet (10 mg total) by mouth daily.   No facility-administered encounter medications on file as of 09/27/2020.    Allergies (verified) Flagyl [metronidazole], Penicillin g, Iodinated diagnostic agents, and Flomax [tamsulosin hcl]   History: Past Medical History:  Diagnosis Date  . Atypical chest pain    a. 08/2016 Neg MV.  [name] COPD (chronic obstructive pulmonary disease) ([place])   . Coronary artery disease, non-occlusive    a.  LHC 06/19/2018: Mid LAD 50% with iFR 0.94  . Depression   . Diastolic dysfunction    a. 10/2016 Echo: EF 60-65%, no rwma, Gr1 DD, mild MR, nl LA size, nl RV fxn; b.  05/2018 Echo: EF 60-65%. No rwma. Mild MR. Nl RV fxn.  [name] GERD (gastroesophageal reflux disease)   . Hypotension   . IBS (irritable bowel syndrome)   . Right arm weakness   . Situational syncope   . TIA (transient ischemic attack)    Past Surgical History:  Procedure Laterality Date  . COLONOSCOPY WITH PROPOFOL N/A 11/22/2015   Procedure: COLONOSCOPY WITH PROPOFOL;  Surgeon: Scot Jun, MD;  Location: [facility] ENDOSCOPY;  Service: Endoscopy;  Laterality: N/A;  . ESOPHAGOGASTRODUODENOSCOPY (EGD)  WITH PROPOFOL N/A 11/22/2015   Procedure: ESOPHAGOGASTRODUODENOSCOPY (EGD) WITH PROPOFOL;  Surgeon: Scot Jun, MD;  Location: [facility] ENDOSCOPY;  Service: Endoscopy;  Laterality: N/A;  . INTRAVASCULAR PRESSURE WIRE/FFR STUDY N/A 06/19/2018   Procedure: INTRAVASCULAR PRESSURE WIRE/FFR STUDY;  Surgeon: Yvonne Kendall, MD;  Location: [facility] INVASIVE CV LAB;  Service: Cardiovascular;  Laterality: N/A;  . LEFT HEART CATH AND CORONARY ANGIOGRAPHY N/A 06/19/2018   Procedure: LEFT HEART CATH AND CORONARY ANGIOGRAPHY;  Surgeon: Yvonne Kendall, MD;  Location: [facility] INVASIVE CV LAB;  Service: Cardiovascular;  Laterality: N/A;  . OOPHORECTOMY    . TONSILLECTOMY    . VAGINAL HYSTERECTOMY     Family History  Problem Relation Age of Onset  . Bladder Cancer Mother   . Heart failure Mother   . Renal Disease Mother   . COPD Father   . Heart disease Other   . COPD Other   . Stroke Other   . Breast cancer Paternal Aunt 17  . Colon cancer Brother    Social History   Socioeconomic History  . Marital status: Married    Spouse name: Lindsey Bautista  . Number of children: 2  . Years of education: 27  . Highest education level: High school graduate  Occupational History  . Occupation: Unemployed  Tobacco Use  . Smoking status: Current Every Day Smoker    Packs/day: 0.50    Years: 39.00    Pack years: 19.50    Types: Cigarettes  . Smokeless tobacco: Never Used  Vaping Use  . Vaping Use: Never used  Substance and Sexual Activity  . Alcohol use: No    Alcohol/week: 0.0 standard drinks  . Drug use: No  . Sexual activity: Not Currently    Birth control/protection: Surgical    Comment: Hysterectomy  Other Topics Concern  . Not on file  Social History Narrative   Lives at home with husband.    Caffeine use: 1 cup coffee per day   Social Determinants of Health   Financial Resource Strain: Low Risk   . Difficulty of Paying Living Expenses: Not hard at all  Food Insecurity: No Food  Insecurity  . Worried About Programme researcher, broadcasting/film/video in the Last Year: Never true  . Ran Out of Food in the Last Year: Never true  Transportation Needs: No Transportation Needs  . Lack of Transportation (Medical): No  . Lack of Transportation (Non-Medical): No  Physical Activity: Inactive  . Days of Exercise per Week: 0 days  . Minutes of Exercise per Session: 0 min  Stress: Stress Concern Present  . Feeling of Stress : To some extent  Social Connections: Not on file    Tobacco Counseling Ready to quit: Yes Counseling given: Not Answered   Clinical Intake:  Pre-visit preparation completed: Yes  Pain : 0-10 Pain Score: 5  Pain Location: Groin Pain Orientation: Right Pain Descriptors / Indicators: Sore Pain Onset: More than a month ago Pain Frequency: Constant  Nutritional Status: BMI 25 -29 Overweight Nutritional Risks: Nausea/ vomitting/ diarrhea (constant nausea) Diabetes: No  How often do you need to have someone help you when you read instructions, pamphlets, or other written materials from your doctor or pharmacy?: 1 - Never What is the last grade level you completed in school?: 12th grade  Diabetic? no  Interpreter Needed?: No  Information entered by :: NAllen LPN   Activities of Daily Living In your present state of health, do you have any difficulty performing the following activities: 09/27/2020 06/01/2020  Hearing? Y N  Vision? Y Y  Difficulty concentrating or making decisions? Y Y  Comment short term memory issues -  Walking or climbing stairs? N Y  Dressing or bathing? N N  Doing errands, shopping? Y Y  Comment does not drive Facilities manager and eating ? N -  Using the Toilet? N -  In the past six months, have you accidently leaked urine? Y -  Comment with blowing nose -  Do you have problems with loss of bowel control? N -  Managing your Medications? N -  Managing your Finances? N -  Housekeeping or managing your Housekeeping? N -  Some  recent data might be hidden    Patient Care Team: Venita Lick, NP as PCP - General (Nurse Practitioner) Minna Merritts, MD as PCP - Cardiology (Cardiology) Abisogun, Domenica Reamer, MD as Consulting Physician (Endocrinology) Minna Merritts, MD as Consulting Physician (Cardiology) Greg Cutter, [name] as Social Worker (Licensed Clinical Social Worker)  Indicate any recent [place] you may have received from other than [facility] providers in the past year (date may be approximate).     Assessment:   This is a routine wellness examination for Jakayla.  Hearing/Vision screen  Hearing Screening   125Hz  250Hz  500Hz  1000Hz  2000Hz  3000Hz  4000Hz  6000Hz  8000Hz   Right ear:           Left ear:           Vision Screening Comments: No regular eye exams  Dietary issues and exercise activities discussed: Current Exercise Habits: The patient does not participate in regular exercise at present  Goals    .  "I want to alleviate stress as much as possible" (pt-stated)      Current Barriers:  [name] Mental Health Concerns  . Limited education about appropriate self-care methods to implement into her daily routine to combat depression and stress* . Limited access to caregiver- in terms of caring of her sick mother  . Lacks knowledge of community resource: Available mental health support services  Clinical Social Work Clinical Goal(s):  [name] Over the next 90 days, patient will work with [name] to address needs related to learning healthy stress management  Interventions: . Patient interviewed and appropriate assessments performed . [name] provided talk therapy during session  . Patient was able to identify key triggers that cause her anxiety/stress . Provided mental health counseling with regard to Depression . Discussed plans with patient for ongoing care management follow up and provided patient with direct contact information for care management team . Assisted patient/caregiver with obtaining  information about health plan benefits  Patient Self Care Activities:  . Self administers medications as prescribed . Attends all scheduled provider appointments . Calls provider office for new concerns or questions  Initial goal documentation     .  Patient Stated      09/27/2020, no goals    .  Quit smoking / using tobacco  Smoking cessation discussed       Depression Screen PHQ 2/9 Scores 09/27/2020 08/11/2020 06/01/2020 03/01/2020 10/31/2019 05/14/2019 09/02/2018  PHQ - 2 Score 3 1 2 2 4 6 2   PHQ- 9 Score 10 9 6 8 16 22 12     Fall Risk Fall Risk  09/27/2020 08/11/2020 05/29/2019 03/10/2019 09/02/2018  Falls in the past year? 1 0 1 0 1  Comment passed out - - - -  Number falls in past yr: 1 0 0 0 1  Injury with Fall? 0 0 0 0 0  Risk for fall due to : Medication side effect - Impaired balance/gait - -  Follow up Falls evaluation completed;Education provided;Falls prevention discussed - - Falls evaluation completed Falls evaluation completed    FALL RISK PREVENTION PERTAINING TO THE HOME:  Any stairs in or around the home? Yes    If so, are there any without handrails? No  Home free of loose throw rugs in walkways, pet beds, electrical cords, etc? Yes  Adequate lighting in your home to reduce risk of falls? Yes   ASSISTIVE DEVICES UTILIZED TO PREVENT FALLS:  Life alert? No  Use of a cane, walker or w/c? No  Grab bars in the bathroom? No  Shower chair or bench in shower? No  Elevated toilet seat or a handicapped toilet? No   TIMED UP AND GO:  Was the test performed? No   Cognitive Function:     6CIT Screen 09/27/2020 05/27/2018 05/09/2017  What Year? 0 points 0 points 0 points  What month? 0 points 0 points 0 points  What time? 0 points 0 points 0 points  Count back from 20 0 points 0 points 0 points  Months in reverse 2 points 0 points 0 points  Repeat phrase 8 points 2 points 6 points  Total Score 10 2 6     Immunizations Immunization History  Administered  Date(s) Administered  . Influenza,inj,Quad PF,6+ Mos 06/21/2015, 05/09/2017  . Moderna Sars-Covid-2 Vaccination 05/09/2020, 06/06/2020  . Td 08/21/2001    TDAP status: Due, Education has been provided regarding the importance of this vaccine. Advised may receive this vaccine at local pharmacy or Health Dept. Aware to provide a copy of the vaccination record if obtained from local pharmacy or Health Dept. Verbalized acceptance and understanding.  Flu Vaccine status: Declined, Education has been provided regarding the importance of this vaccine but patient still declined. Advised may receive this vaccine at local pharmacy or Health Dept. Aware to provide a copy of the vaccination record if obtained from local pharmacy or Health Dept. Verbalized acceptance and understanding.  Pneumococcal vaccine status: Up to date  Covid-19 vaccine status: Completed vaccines  Qualifies for Shingles Vaccine? Yes   Zostavax completed No   Shingrix Completed?: No.    Education has been provided regarding the importance of this vaccine. Patient has been advised to call insurance company to determine out of pocket expense if they have not yet received this vaccine. Advised may also receive vaccine at local pharmacy or Health Dept. Verbalized acceptance and understanding.  Screening Tests Health Maintenance  Topic Date Due  . TETANUS/TDAP  08/22/2011  . INFLUENZA VACCINE  11/18/2020 (Originally 03/21/2020)  . COVID-19 Vaccine (3 - Booster for Moderna series) 12/05/2020  . MAMMOGRAM  04/22/2021  . COLONOSCOPY (Pts 45-40yrs Insurance coverage will need to be confirmed)  02/12/2025  . Hepatitis C Screening  Completed  . HIV Screening  Completed    Health Maintenance  Health Maintenance Due  Topic Date Due  . TETANUS/TDAP  08/22/2011    Colorectal cancer screening: Type of screening: Colonoscopy. Completed 02/13/2020. Repeat every 5 years  Mammogram status: patient to schedule  Bone Density status:  n/a  Lung Cancer Screening: (Low Dose CT Chest recommended if Age 29-80 years, 30 pack-year currently smoking OR have quit w/in 15years.) does qualify.   Lung Cancer Screening Referral: completed 06/01/2020  Additional Screening:  Hepatitis C Screening: does qualify; Completed 03/30/2016  Vision Screening: Recommended annual ophthalmology exams for early detection of glaucoma and other disorders of the eye. Is the patient up to date with their annual eye exam?  No  Who is the provider or what is the name of the office in which the patient attends annual eye exams? none If pt is not established with a provider, would they like to be referred to a provider to establish care? No .   Dental Screening: Recommended annual dental exams for proper oral hygiene  Community Resource Referral / Chronic Care Management: CRR required this visit?  No   CCM required this visit?  No      Plan:     I have personally reviewed and noted the following in the patient's chart:   . Medical and social history . Use of alcohol, tobacco or illicit drugs  . Current medications and supplements . Functional ability and status . Nutritional status . Physical activity . Advanced directives . List of other physicians . Hospitalizations, surgeries, and ER visits in previous 12 months . Vitals . Screenings to include cognitive, depression, and falls . Referrals and appointments  In addition, I have reviewed and discussed with patient certain preventive protocols, quality metrics, and best practice recommendations. A written personalized care plan for preventive services as well as general preventive health recommendations were provided to patient.     Kellie Simmering, LPN   11/23/8183   Nurse Notes:

## 2020-09-29 ENCOUNTER — Ambulatory Visit (INDEPENDENT_AMBULATORY_CARE_PROVIDER_SITE_OTHER): Payer: Medicare Other | Admitting: Nurse Practitioner

## 2020-09-29 ENCOUNTER — Other Ambulatory Visit: Payer: Self-pay

## 2020-09-29 ENCOUNTER — Encounter: Payer: Self-pay | Admitting: Nurse Practitioner

## 2020-09-29 VITALS — BP 136/79 | HR 65 | Temp 98.1°F | Ht 61.58 in | Wt 143.0 lb

## 2020-09-29 DIAGNOSIS — F419 Anxiety disorder, unspecified: Secondary | ICD-10-CM

## 2020-09-29 DIAGNOSIS — B9689 Other specified bacterial agents as the cause of diseases classified elsewhere: Secondary | ICD-10-CM

## 2020-09-29 DIAGNOSIS — I2510 Atherosclerotic heart disease of native coronary artery without angina pectoris: Secondary | ICD-10-CM

## 2020-09-29 DIAGNOSIS — R399 Unspecified symptoms and signs involving the genitourinary system: Secondary | ICD-10-CM | POA: Diagnosis not present

## 2020-09-29 DIAGNOSIS — J432 Centrilobular emphysema: Secondary | ICD-10-CM | POA: Diagnosis not present

## 2020-09-29 DIAGNOSIS — I700 Atherosclerosis of aorta: Secondary | ICD-10-CM | POA: Diagnosis not present

## 2020-09-29 DIAGNOSIS — F17210 Nicotine dependence, cigarettes, uncomplicated: Secondary | ICD-10-CM

## 2020-09-29 DIAGNOSIS — F339 Major depressive disorder, recurrent, unspecified: Secondary | ICD-10-CM

## 2020-09-29 DIAGNOSIS — E782 Mixed hyperlipidemia: Secondary | ICD-10-CM | POA: Diagnosis not present

## 2020-09-29 DIAGNOSIS — N952 Postmenopausal atrophic vaginitis: Secondary | ICD-10-CM

## 2020-09-29 DIAGNOSIS — N760 Acute vaginitis: Secondary | ICD-10-CM

## 2020-09-29 DIAGNOSIS — F1320 Sedative, hypnotic or anxiolytic dependence, uncomplicated: Secondary | ICD-10-CM | POA: Insufficient documentation

## 2020-09-29 LAB — WET PREP FOR TRICH, YEAST, CLUE
Clue Cell Exam: POSITIVE — AB
Trichomonas Exam: NEGATIVE
Yeast Exam: NEGATIVE

## 2020-09-29 LAB — URINALYSIS, ROUTINE W REFLEX MICROSCOPIC
Bilirubin, UA: NEGATIVE
Glucose, UA: NEGATIVE
Ketones, UA: NEGATIVE
Leukocytes,UA: NEGATIVE
Nitrite, UA: NEGATIVE
Protein,UA: NEGATIVE
Specific Gravity, UA: 1.01 (ref 1.005–1.030)
Urobilinogen, Ur: 0.2 mg/dL (ref 0.2–1.0)
pH, UA: 5.5 (ref 5.0–7.5)

## 2020-09-29 LAB — MICROSCOPIC EXAMINATION

## 2020-09-29 MED ORDER — CLONAZEPAM 1 MG PO TABS: 1.0000 mg | ORAL_TABLET | Freq: Two times a day (BID) | ORAL | 2 refills | Status: DC

## 2020-09-29 MED ORDER — CLINDAMYCIN PHOSPHATE 2 % VA CREA: 1.0000 | TOPICAL_CREAM | Freq: Every day | VAGINAL | 0 refills | Status: AC

## 2020-09-29 NOTE — Assessment & Plan Note (Signed)
Chronic, ongoing.  No current inhaler regimen.  Denies any symptoms.  Continue yearly lung screening and plan on spirometry next visit to assess function.  Recommend complete cessation of smoking.   

## 2020-09-29 NOTE — Patient Instructions (Signed)
Bacterial Vaginosis  Bacterial vaginosis is an infection of the vagina. It happens when too many normal germs (healthy bacteria) grow in the vagina. This infection can make it easier to get other infections from sex (STIs). It is very important for pregnant women to get treated. This infection can cause babies to be born early or at a low birth weight. What are the causes? This infection is caused by an increase in certain germs that grow in the vagina. You cannot get this infection from toilet seats, bedsheets, swimming pools, or things that touch your vagina. What increases the risk?  Having sex with a new person or more than one person.  Having sex without protection.  Douching.  Having an intrauterine device (IUD).  Smoking.  Using drugs or drinking alcohol. These can lead you to do things that are risky.  Taking certain antibiotic medicines.  Being pregnant. What are the signs or symptoms? Some women have no symptoms. Symptoms may include:  A discharge from your vagina. It may be gray or white. It can be watery or foamy.  A fishy smell. This can happen after sex or during your menstrual period.  Itching in and around your vagina.  A feeling of burning or pain when you pee (urinate). How is this treated? This infection is treated with antibiotic medicines. These may be given to you as:  A pill.  A cream for your vagina.  A medicine that you put into your vagina (suppository). If the infection comes back after treatment, you may need more antibiotics. Follow these instructions at home: Medicines  Take over-the-counter and prescription medicines as told by your doctor.  Take or use your antibiotic medicine as told by your doctor. Do not stop taking or using it, even if you start to feel better. General instructions  If the person you have sex with is a woman, tell her that you have this infection. She will need to follow up with her doctor. If you have a female  partner, he does not need to be treated.  Do not have sex until you finish treatment.  Drink enough fluid to keep your pee pale yellow.  Keep your vagina and butt clean. ? Wash the area with warm water each day. ? Wipe from front to back after you use the toilet.  If you are breastfeeding a baby, ask your doctor if you should keep doing so during treatment.  Keep all follow-up visits. How is this prevented? Self-care  Do not douche.  Use only warm water to wash around your vagina.  Wear underwear that is cotton or lined with cotton.  Do not wear tight pants and pantyhose, especially in the summer. Safe sex  Use protection when you have sex. This includes: ? Use condoms. ? Use dental dams. This is a thin layer that protects the mouth during oral sex.  Limit how many people you have sex with. To prevent this infection, it is best to have sex with just one person.  Get tested for STIs. The person you have sex with should also get tested. Drugs and alcohol  Do not smoke or use any products that contain nicotine or tobacco. If you need help quitting, ask your doctor.  Do not use drugs.  Do not drink alcohol if: ? Your doctor tells you not to drink. ? You are pregnant, may be pregnant, or are planning to become pregnant.  If you drink alcohol: ? Limit how much you have to 0-1 drink   a day. ? Know how much alcohol is in your drink. In the [place] one drink equals one 12 oz bottle of beer (355 mL), one 5 oz glass of wine (148 mL), or one 1 oz glass of hard liquor (44 mL). Where to find more information  Centers for Disease Control and Prevention: www.cdc.gov  American Sexual Health Association: www.ashastd.org  Office on Women's Health: www.womenshealth.gov Contact a doctor if:  Your symptoms do not get better, even after you are treated.  You have more discharge or pain when you pee.  You have a fever or chills.  You have pain in your belly (abdomen) or in the area  between your hips.  You have pain with sex.  You bleed from your vagina between menstrual periods. Summary  This infection can happen when too many germs (bacteria) grow in the vagina.  This infection can make it easier to get infections from sex (STIs). Treating this can lower that chance.  Get treated if you are pregnant. This infection can cause babies to be born early.  Do not stop taking or using your antibiotic medicine, even if you start to feel better. This information is not intended to replace advice given to you by your health care provider. Make sure you discuss any questions you have with your health care provider. Document Revised: 02/05/2020 Document Reviewed: 02/05/2020 Elsevier Patient Education  2021 Elsevier Inc.  

## 2020-09-29 NOTE — Assessment & Plan Note (Signed)
Chronic, stable.  Continue current medication regimen and collaboration with cardiology. 

## 2020-09-29 NOTE — Assessment & Plan Note (Signed)
Refer to anxiety plan for updates and discussion.

## 2020-09-29 NOTE — Assessment & Plan Note (Signed)
Chronic, ongoing, noted on October 2019 lung screening CT.  Continue statin and ASA daily + recommend complete cessation of smoking. 

## 2020-09-29 NOTE — Assessment & Plan Note (Signed)
Chronic, ongoing.  Continue current medication regimen and collaboration with cardiology.  Lipid panel today.

## 2020-09-29 NOTE — Assessment & Plan Note (Signed)
Acute, recurrent with wet prep noting clue cells.  UA negative, with exception trace BLD.  Allergic to Flagyl, will send script for Clindamycin vaginal 2% cream which was beneficial last time.  She is aware to alert provider if any issues.  Return for worsening or ongoing issues.  Continue vaginal estrace for atrophy.  Referral to GYN for recurrent infections as needed.

## 2020-09-29 NOTE — Assessment & Plan Note (Signed)
I have recommended complete cessation of tobacco use. I have discussed various options available for assistance with tobacco cessation including over the counter methods (Nicotine gum, patch and lozenges). We also discussed prescription options (Chantix, Nicotine Inhaler / Nasal Spray). The patient is not interested in pursuing any prescription tobacco cessation options at this time.  Continue annual lung CT screening. ? ?

## 2020-09-29 NOTE — Assessment & Plan Note (Signed)
Chronic, ongoing.  Continue Klonopin at this time, patient educated on risks of medication and is not interested in reducing.  Continue Prozac 40 MG which benefist anxiety and fibromyalgia, discussed with her changing to Zoloft or Duloxetine if increase anxiety/depression noted.  At this time she denies SI/HI and wishes to maintain current regimen.  UDS today and contract up to date October 2021.  Return in 3 months.

## 2020-09-29 NOTE — Assessment & Plan Note (Signed)
Chronic, stable.  Continue current medication regimen and adjust as needed.  Denies SI/HI.  Consider change from Prozac to Zoloft in future, which would offer benefit with anxiety and depression.  At this time she wishes not to change regimen.

## 2020-09-29 NOTE — Progress Notes (Signed)
BP 136/79   Pulse 65   Temp 98.1 F (36.7 C) (Oral)   Ht 5' 1.58" (1.564 m)   Wt 143 lb (64.9 kg)   SpO2 98%   BMI 26.52 kg/m    Subjective:    Patient ID: Lindsey Bautista, female    DOB: 25-Mar-1961, 60 y.o.   MRN: [id number]  HPI: Lindsey Bautista is a 60 y.o. female  Chief Complaint  Patient presents with  . Urinary Frequency    Patient states that it feels like there is something in her vagina, this happens when she sits and is very uncomfortable. For past week or so.  . Nausea    Has having nausea all day with the exception of when she sleeps for the past week or so   ABDOMINAL PAIN  Reports this has been ongoing for 1 week -- reports it is mild. Has a history of frequent UTI and BV infections -- only able to use Clindamycin gel 2% to treat. Last treated in 06/01/2020.  She reports feeling fullness in vagina.   Is a current smoker. Had a hysterectomy in 1992 took uterus, 1993 took right ovary, 1994 took left ovary. She is unsure if cervix is still present. No history of breast CA, endometrial CA, or ovarian CA. Has not been sexually active with her husband in several years. Duration:weeks Onset: gradual Severity: 6/10  Quality: dull and aching Location: epigastric Episode duration:  Radiation: none Frequency: constant Alleviating factors: unknown Aggravating factors: unknown Status: fluctuating Treatments attempted: Tylenol and Ibuprofen (alternates) Fever: no Nausea: yes Vomiting: no Weight loss: no Decreased appetite: none Diarrhea: no Constipation: yes Blood in stool: no Heartburn: no Jaundice: no Rash: no Dysuria/urinary frequency: no Hematuria: no History of sexually transmitted disease: no Recurrent NSAID use: no  DEPRESSION/ANXIETY Continues on Prozac 20 MG daily and Klonopin 1 MG BID. Pt isaware of risks of benzomedication use to include increased sedation, respiratory suppression, falls, dependence and cardiovascular events.Ptwould like to  continue treatment as benefit determined to outweigh risk. On PDMP review last Klonopin fill 09/02/20 for #60. No other controlled substances noted.   Mood status:stable Satisfied with current treatment?:yes Symptom severity:mild Duration of current treatment :chronic Side effects:no Medication compliance:good compliance Psychotherapy/counseling:none Previous psychiatric medications:Prozac and Klonopin Depressed mood:no Anxious mood:yes Anhedonia:no Significant weight loss or gain:no Insomnia:yeshard to stay asleep Fatigue:at times Feelings of worthlessness or guilt:none Impaired concentration/indecisiveness:yes Suicidal ideations:no Hopelessness:no Crying spells:no Depression screen [facility] 2/9 09/29/2020 09/27/2020 08/11/2020 06/01/2020 03/01/2020  Decreased Interest 0 3 0 1 1  Down, Depressed, Hopeless 1 0 1 1 1   PHQ - 2 Score 1 3 1 2 2   Altered sleeping 1 1 2  0 1  Tired, decreased energy 1 3 3 1 3   Change in appetite 1 0 2 0 0  Feeling bad or failure about yourself  0 0 0 1 1  Trouble concentrating 0 3 1 1  0  Moving slowly or fidgety/restless 0 0 0 1 1  Suicidal thoughts 0 0 0 0 0  PHQ-9 Score 4 10 9 6 8   Difficult doing work/chores - Somewhat difficult Somewhat difficult Somewhat difficult Somewhat difficult  Some recent data might be hidden   GAD 7 : Generalized Anxiety Score 09/29/2020 06/01/2020 03/01/2020 10/31/2019  Nervous, Anxious, on Edge 2 2 1 2   Control/stop worrying 2 3 1 3   Worry too much - different things 2 3 1 2   Trouble relaxing 1 3 0 1  Restless 1 1 0 1  Easily annoyed or irritable 2 3 1 3   Afraid - awful might happen 1 3 2 2   Total GAD 7 Score 11 18 6 14   Anxiety Difficulty Somewhat difficult Very difficult Somewhat difficult Somewhat difficult   HYPERLIPIDEMIA Currently on Zetia, last saw Dr. Rockey Situ on 05/03/20. Atorvastatin 80 MG daily and Zetia 10 MG daily.Last lipid panel in October 2021 -- LDL 75. Hyperlipidemia status:good  compliance Satisfied with current treatment?yes Side effects:no Medication compliance:good compliance Past cholesterol meds:Atorvastatin and Zetia Supplements:none Aspirin:yes The 10-year ASCVD risk score Mikey Bussing DC Jr., et al., 2013) is: 5.5%   Values used to calculate the score:     Age: 59 years     Sex: Female     Is Non-Hispanic African American: No     Diabetic: No     Tobacco smoker: Yes     Systolic Blood Pressure: 841 mmHg     Is BP treated: No     HDL Cholesterol: 53 mg/dL     Total Cholesterol: 139 mg/dL] Chest pain:no Coronary artery disease:no Family history CAD:no Family history early CAD:no  COPD No current inhaler regimen.  Had lung CT screening last 06/01/20, noted mild centrilobular emphysema and aortic atherosclerosis. Currently smoking 1/2 PPD, has smoked for 40 years.  Not interested in quitting.   COPD status: stable Satisfied with current treatment?: yes Oxygen use: no Dyspnea frequency:  Cough frequency:  Rescue inhaler frequency:   Limitation of activity: no Productive cough:  Last Spirometry: none recent Pneumovax: refused Influenza: refused   Relevant past medical, surgical, family and social history reviewed and updated as indicated. Interim medical history since our last visit reviewed. Allergies and medications reviewed and updated.  Review of Systems  Constitutional: Negative for activity change, appetite change, diaphoresis, fatigue and fever.  Respiratory: Negative for cough, chest tightness, shortness of breath and wheezing.   Cardiovascular: Negative for chest pain, palpitations and leg swelling.  Gastrointestinal: Positive for abdominal pain. Negative for abdominal distention, blood in stool, constipation, diarrhea, nausea and vomiting.  Endocrine: Negative for cold intolerance and heat intolerance.  Neurological: Negative for dizziness, syncope, weakness, light-headedness, numbness and headaches.   Psychiatric/Behavioral: Negative for decreased concentration, self-injury, sleep disturbance and suicidal ideas. The patient is nervous/anxious.     Per HPI unless specifically indicated above     Objective:    BP 136/79   Pulse 65   Temp 98.1 F (36.7 C) (Oral)   Ht 5' 1.58" (1.564 m)   Wt 143 lb (64.9 kg)   SpO2 98%   BMI 26.52 kg/m   Wt Readings from Last 3 Encounters:  09/29/20 143 lb (64.9 kg)  09/27/20 140 lb (63.5 kg)  08/11/20 143 lb 9.6 oz (65.1 kg)    Physical Exam Vitals and nursing note reviewed.  Constitutional:      General: She is awake. She is not in acute distress.    Appearance: She is well-developed, well-groomed and overweight. She is not ill-appearing.  HENT:     Head: Normocephalic.     Right Ear: Hearing normal.     Left Ear: Hearing normal.  Eyes:     General: Lids are normal.        Right eye: No discharge.        Left eye: No discharge.     Conjunctiva/sclera: Conjunctivae normal.     Pupils: Pupils are equal, round, and reactive to light.  Neck:     Thyroid: No thyromegaly.     Vascular: No carotid  bruit.  Cardiovascular:     Rate and Rhythm: Normal rate and regular rhythm.     Heart sounds: Normal heart sounds. No murmur heard. No gallop.   Pulmonary:     Effort: Pulmonary effort is normal. No accessory muscle usage or respiratory distress.     Breath sounds: Normal breath sounds.  Abdominal:     General: Bowel sounds are normal. There is no distension or abdominal bruit.     Palpations: Abdomen is soft. There is no hepatomegaly or splenomegaly.     Tenderness: There is no abdominal tenderness. There is no right CVA tenderness, left CVA tenderness, guarding or rebound.     Hernia: No hernia is present.  Musculoskeletal:     Cervical back: Normal range of motion and neck supple.     Right lower leg: No edema.     Left lower leg: No edema.  Skin:    General: Skin is warm and dry.  Neurological:     Mental Status: She is alert and  oriented to person, place, and time.  Psychiatric:        Attention and Perception: Attention normal.        Mood and Affect: Mood normal.        Behavior: Behavior normal. Behavior is cooperative.        Thought Content: Thought content normal.        Judgment: Judgment normal.    Results for orders placed or performed in visit on 08/11/20  WET PREP FOR [place], YEAST, CLUE   Specimen: Sterile Swab   Sterile Swab  Result Value Ref Range   Trichomonas Exam Negative Negative   Yeast Exam Negative Negative   Clue Cell Exam Negative Negative  Microscopic Examination   Urine  Result Value Ref Range   WBC, UA None seen 0 - 5 /hpf   RBC 0-2 0 - 2 /hpf   Epithelial Cells (non renal) 0-10 0 - 10 /hpf   Bacteria, UA Few (A) None seen/Few  Urinalysis, Routine w reflex microscopic  Result Value Ref Range   Specific Gravity, UA 1.025 1.005 - 1.030   pH, UA 5.5 5.0 - 7.5   Color, UA Yellow Yellow   Appearance Ur Clear Clear   Leukocytes,UA Negative Negative   Protein,UA Negative Negative/Trace   Glucose, UA Negative Negative   Ketones, UA Negative Negative   RBC, UA Trace (A) Negative   Bilirubin, UA Negative Negative   Urobilinogen, Ur 1.0 0.2 - 1.0 mg/dL   Nitrite, UA Negative Negative   Microscopic Examination See below:       Assessment & Plan:   Problem List Items Addressed This Visit      Cardiovascular and Mediastinum   Aortic atherosclerosis (HCC)    Chronic, ongoing, noted on October 2019 lung screening CT.  Continue statin and ASA daily + recommend complete cessation of smoking.      Coronary artery disease, non-occlusive    Chronic, stable.  Continue current medication regimen and collaboration with cardiology.        Respiratory   Centrilobular emphysema (HCC) - Primary    Chronic, ongoing.  No current inhaler regimen.  Denies any symptoms.  Continue yearly lung screening and plan on spirometry next visit to assess function.  Recommend complete cessation of  smoking.        Genitourinary   Bacterial vaginosis    Acute, recurrent with wet prep noting clue cells.  UA negative, with exception trace BLD.  Allergic  to Flagyl, will send script for Clindamycin vaginal 2% cream which was beneficial last time.  She is aware to alert provider if any issues.  Return for worsening or ongoing issues.  Continue vaginal estrace for atrophy.  Referral to GYN for recurrent infections as needed.      Relevant Orders   WET PREP FOR [place], YEAST, CLUE   Urinalysis, Routine w reflex microscopic   Atrophic vaginitis    Ongoing.  Continue oral Estradiol to 0.5MG , have attempted discontinuation and reduction without success.  Return to GYN as needed in future.        Other   Nicotine dependence, cigarettes, uncomplicated    I have recommended complete cessation of tobacco use. I have discussed various options available for assistance with tobacco cessation including over the counter methods (Nicotine gum, patch and lozenges). We also discussed prescription options (Chantix, Nicotine Inhaler / Nasal Spray). The patient is not interested in pursuing any prescription tobacco cessation options at this time.  Continue annual lung CT screening.       Depression, recurrent (HCC)    Chronic, stable.  Continue current medication regimen and adjust as needed.  Denies SI/HI.  Consider change from Prozac to Zoloft in future, which would offer benefit with anxiety and depression.  At this time she wishes not to change regimen.      Hyperlipidemia    Chronic, ongoing.  Continue current medication regimen and collaboration with cardiology.  Lipid panel today.      Relevant Orders   Basic metabolic panel   Lipid Panel w/o Chol/HDL Ratio   Anxiety    Chronic, ongoing.  Continue Klonopin at this time, patient educated on risks of medication and is not interested in reducing.  Continue Prozac 40 MG which benefist anxiety and fibromyalgia, discussed with her changing to Zoloft  or Duloxetine if increase anxiety/depression noted.  At this time she denies SI/HI and wishes to maintain current regimen.  UDS today and contract up to date October 2021.  Return in 3 months.      Benzodiazepine dependence, continuous ([place])    Refer to anxiety plan for updates and discussion.          Follow up plan: Return in about 3 months (around 12/27/2020) for MOOD, HTN/HLD, COPD -- spirometry.

## 2020-09-29 NOTE — Assessment & Plan Note (Signed)
Ongoing.  Continue oral Estradiol to 0.5MG , have attempted discontinuation and reduction without success.  Return to GYN as needed in future.

## 2020-09-30 LAB — BASIC METABOLIC PANEL
BUN/Creatinine Ratio: 15 (ref 9–23)
BUN: 12 mg/dL (ref 6–24)
CO2: 24 mmol/L (ref 20–29)
Calcium: 9.2 mg/dL (ref 8.7–10.2)
Chloride: 101 mmol/L (ref 96–106)
Creatinine, Ser: 0.79 mg/dL (ref 0.57–1.00)
GFR calc Af Amer: 95 mL/min/{1.73_m2} (ref >59)
GFR calc non Af Amer: 82 mL/min/{1.73_m2} (ref >59)
Glucose: 65 mg/dL (ref 65–99)
Potassium: 4.1 mmol/L (ref 3.5–5.2)
Sodium: 137 mmol/L (ref 134–144)

## 2020-09-30 LAB — LIPID PANEL W/O CHOL/HDL RATIO
Cholesterol, Total: 129 mg/dL (ref 100–199)
HDL: 52 mg/dL (ref >39)
LDL Chol Calc (NIH): 65 mg/dL (ref 0–99)
Triglycerides: 54 mg/dL (ref 0–149)
VLDL Cholesterol Cal: 12 mg/dL (ref 5–40)

## 2020-09-30 NOTE — Progress Notes (Signed)
Please let Noemi know her labs have returned and kidney function remains normal + cholesterol levels are at goal.  Continue all current medications as ordered. Keep being awesome!!  Thank you for allowing me to participate in your care. Kindest regards, Achsah Mcquade

## 2020-10-01 ENCOUNTER — Other Ambulatory Visit (HOSPITAL_COMMUNITY): Payer: Self-pay | Admitting: Gastroenterology

## 2020-10-01 ENCOUNTER — Other Ambulatory Visit: Payer: Self-pay | Admitting: Gastroenterology

## 2020-10-01 DIAGNOSIS — K2270 Barrett's esophagus without dysplasia: Secondary | ICD-10-CM | POA: Diagnosis not present

## 2020-10-01 DIAGNOSIS — R1319 Other dysphagia: Secondary | ICD-10-CM

## 2020-10-01 DIAGNOSIS — K219 Gastro-esophageal reflux disease without esophagitis: Secondary | ICD-10-CM

## 2020-10-01 DIAGNOSIS — R0789 Other chest pain: Secondary | ICD-10-CM | POA: Diagnosis not present

## 2020-10-06 ENCOUNTER — Ambulatory Visit: Payer: Medicare Other

## 2020-10-06 ENCOUNTER — Other Ambulatory Visit: Payer: Self-pay

## 2020-10-06 ENCOUNTER — Ambulatory Visit
Admission: RE | Admit: 2020-10-06 | Discharge: 2020-10-06 | Disposition: A | Discharge status: Home / Self Care | Payer: Medicare Other | Source: Ambulatory Visit | Attending: Gastroenterology | Admitting: Gastroenterology

## 2020-10-06 DIAGNOSIS — R0789 Other chest pain: Secondary | ICD-10-CM | POA: Diagnosis not present

## 2020-10-06 DIAGNOSIS — K2270 Barrett's esophagus without dysplasia: Secondary | ICD-10-CM | POA: Diagnosis not present

## 2020-10-06 DIAGNOSIS — K219 Gastro-esophageal reflux disease without esophagitis: Secondary | ICD-10-CM | POA: Diagnosis not present

## 2020-10-06 DIAGNOSIS — R1319 Other dysphagia: Secondary | ICD-10-CM | POA: Diagnosis not present

## 2020-10-06 DIAGNOSIS — R1310 Dysphagia, unspecified: Secondary | ICD-10-CM | POA: Diagnosis not present

## 2020-10-18 ENCOUNTER — Other Ambulatory Visit: Payer: Self-pay | Admitting: Nurse Practitioner

## 2020-10-18 DIAGNOSIS — I2510 Atherosclerotic heart disease of native coronary artery without angina pectoris: Secondary | ICD-10-CM

## 2020-10-18 MED ORDER — NITROGLYCERIN 0.4 MG SL SUBL: 0.4000 mg | SUBLINGUAL_TABLET | SUBLINGUAL | 3 refills | Status: DC | PRN

## 2020-10-18 MED ORDER — EPINEPHRINE 0.3 MG/0.3ML IJ SOAJ: 0.3000 mg | INTRAMUSCULAR | 0 refills | Status: DC | PRN

## 2020-10-18 NOTE — Telephone Encounter (Signed)
Copied from [place] [id number]. Topic: Quick Communication - Rx Refill/Question >> Oct 18, 2020 10:18 AM Leward Quan A wrote: Medication: EPINEPHrine 0.3 mg/0.3 mL IJ SOAJ injection, nitroGLYCERIN (NITROSTAT) 0.4 MG SL tablet  Has the patient contacted their pharmacy? Yes.   (Agent: If no, request that the patient contact the pharmacy for the refill.) (Agent: If yes, when and what did the pharmacy advise?)  Preferred Pharmacy (with phone number or street name): [facility] [place] [facility] [place]  Phone:  [phone] Fax:  [phone]     Agent: Please be advised that RX refills may take up to 3 business days. We ask that you follow-up with your pharmacy.

## 2020-10-28 ENCOUNTER — Other Ambulatory Visit: Payer: Self-pay | Admitting: Nurse Practitioner

## 2020-11-03 ENCOUNTER — Encounter: Payer: Self-pay | Admitting: Nurse Practitioner

## 2020-11-03 ENCOUNTER — Ambulatory Visit (INDEPENDENT_AMBULATORY_CARE_PROVIDER_SITE_OTHER): Payer: Medicare (Managed Care) | Admitting: Nurse Practitioner

## 2020-11-03 DIAGNOSIS — I700 Atherosclerosis of aorta: Secondary | ICD-10-CM | POA: Diagnosis not present

## 2020-11-03 DIAGNOSIS — J029 Acute pharyngitis, unspecified: Secondary | ICD-10-CM | POA: Diagnosis not present

## 2020-11-03 DIAGNOSIS — Z880 Allergy status to penicillin: Secondary | ICD-10-CM | POA: Diagnosis not present

## 2020-11-03 DIAGNOSIS — J060 Acute laryngopharyngitis: Secondary | ICD-10-CM | POA: Diagnosis not present

## 2020-11-03 DIAGNOSIS — R0789 Other chest pain: Secondary | ICD-10-CM | POA: Diagnosis not present

## 2020-11-03 DIAGNOSIS — R6884 Jaw pain: Secondary | ICD-10-CM | POA: Diagnosis not present

## 2020-11-03 DIAGNOSIS — Z79899 Other long term (current) drug therapy: Secondary | ICD-10-CM | POA: Diagnosis not present

## 2020-11-03 DIAGNOSIS — J392 Other diseases of pharynx: Secondary | ICD-10-CM | POA: Diagnosis not present

## 2020-11-03 DIAGNOSIS — Z7982 Long term (current) use of aspirin: Secondary | ICD-10-CM | POA: Diagnosis not present

## 2020-11-03 DIAGNOSIS — F17210 Nicotine dependence, cigarettes, uncomplicated: Secondary | ICD-10-CM | POA: Diagnosis not present

## 2020-11-03 DIAGNOSIS — J040 Acute laryngitis: Secondary | ICD-10-CM | POA: Diagnosis not present

## 2020-11-03 DIAGNOSIS — I2510 Atherosclerotic heart disease of native coronary artery without angina pectoris: Secondary | ICD-10-CM | POA: Diagnosis not present

## 2020-11-03 DIAGNOSIS — Z8673 Personal history of transient ischemic attack (TIA), and cerebral infarction without residual deficits: Secondary | ICD-10-CM | POA: Diagnosis not present

## 2020-11-03 DIAGNOSIS — R221 Localized swelling, mass and lump, neck: Secondary | ICD-10-CM | POA: Diagnosis not present

## 2020-11-03 DIAGNOSIS — K219 Gastro-esophageal reflux disease without esophagitis: Secondary | ICD-10-CM | POA: Diagnosis not present

## 2020-11-03 DIAGNOSIS — M542 Cervicalgia: Secondary | ICD-10-CM | POA: Diagnosis not present

## 2020-11-03 DIAGNOSIS — R590 Localized enlarged lymph nodes: Secondary | ICD-10-CM | POA: Diagnosis not present

## 2020-11-03 DIAGNOSIS — Z91041 Radiographic dye allergy status: Secondary | ICD-10-CM | POA: Diagnosis not present

## 2020-11-03 NOTE — Patient Instructions (Signed)

## 2020-11-03 NOTE — Progress Notes (Signed)
There were no vitals taken for this visit.   Subjective:    Patient ID: Lindsey Bautista, female    DOB: 04-30-1961, 60 y.o.   MRN: [id number]  HPI: Lindsey Bautista is a 60 y.o. female  Chief Complaint  Patient presents with  . Sore Throat  . Hoarse    Patient states she is has been hoarse for going on 2 weeks now and states the Friday before 10/20/20 she would run a fever and it wouldn't break and then she was nauseous, but denies every vomiting. Patient states her after that her throat felt as if it was on fire and dry. Patient states she has some knots on side of her throat and states she has some sores inside of her mouth with a blister underneath the skin. The right side of her jaw is bigger than the left side and complains of a lot of pain.   . Nausea    Patient states she is still sick to her stomach all the time. Patient denies trying anything over the counter.     . This visit was completed via telephone due to the restrictions of the COVID-19 pandemic. All issues as above were discussed and addressed but no physical exam was performed. If it was felt that the patient should be evaluated in the office, they were directed there. The patient verbally consented to this visit. Patient was unable to complete an audio/visual visit due to Technical difficulties, Lack of internet. Due to the catastrophic nature of the COVID-19 pandemic, this visit was done through audio contact only. . Location of the patient: home . Location of the provider: work . Those involved with this call:  . Provider: Marnee Guarneri, DNP . CMA: Irena Reichmann, CMA . Front Desk/Registration: Jill Side  . Time spent on call: 21 minutes on the phone discussing health concerns. 15 minutes total spent in review of patient's record and preparation of their chart.  . I verified patient identity using two factors (patient name and date of birth). Patient consents verbally to being seen via telemedicine visit today.     UPPER RESPIRATORY TRACT INFECTION Currently is having hoarseness -- started with initial symptoms about 2 weeks ago.  Had a fever, thermometer is broke, but would wake up sweating and then would have chills.  On 10/20/2020, started with hoarseness.  Is having ongoing nausea -- she feels like there is something in her esophagus -- feels like something is in her throat.  Inside of her mouth she noticed she had blisters, right side of her jaw she reports the inside of mouth is bigger then her left.  States she is "scared to death", continues to voice concerns about cancer -- which is underlying major concern for her at baseline.  Had an in home Covid test x 2 which were negative. Fever: yes Cough: yes a little bit Shortness of breath: no Wheezing: no Chest pain: no Chest tightness: no Chest congestion: no Nasal congestion: yes Runny nose: yes Post nasal drip: yes Sneezing: yes Sore throat: yes Swollen glands: yes Sinus pressure: no Headache: yes Face pain: yes Toothache: no Ear pain: none Ear pressure: none Eyes red/itching:no Eye drainage/crusting: no  Vomiting: no -- has nausea Rash: no Fatigue: yes Sick contacts: no Strep contacts: no  Context: worse Recurrent sinusitis: no Relief with OTC cold/cough medications: no  Treatments attempted: Tylenol  Relevant past medical, surgical, family and social history reviewed and updated as indicated. Interim medical history since our  last visit reviewed. Allergies and medications reviewed and updated.  Review of Systems  Constitutional: Positive for appetite change, fatigue and fever. Negative for activity change.  HENT: Positive for congestion, postnasal drip, rhinorrhea and sore throat. Negative for ear discharge, ear pain, facial swelling, sinus pressure, sinus pain, sneezing and voice change.   Eyes: Negative for pain and visual disturbance.  Respiratory: Positive for cough and chest tightness. Negative for shortness of breath  and wheezing.   Cardiovascular: Negative for chest pain, palpitations and leg swelling.  Gastrointestinal: Positive for nausea. Negative for abdominal distention, abdominal pain, constipation, diarrhea and vomiting.  Endocrine: Negative.   Musculoskeletal: Positive for myalgias.  Neurological: Positive for headaches. Negative for dizziness and numbness.  Psychiatric/Behavioral: Negative.     Per HPI unless specifically indicated above     Objective:    There were no vitals taken for this visit.  Wt Readings from Last 3 Encounters:  09/29/20 143 lb (64.9 kg)  09/27/20 140 lb (63.5 kg)  08/11/20 143 lb 9.6 oz (65.1 kg)    Physical Exam   Unable to perform due to telephone visit only.  Results for orders placed or performed in visit on 09/29/20  WET PREP FOR [place], YEAST, CLUE   Specimen: Sterile Swab   Sterile Swab  Result Value Ref Range   Trichomonas Exam Negative Negative   Yeast Exam Negative Negative   Clue Cell Exam Positive (A) Negative  Microscopic Examination   Urine  Result Value Ref Range   WBC, UA 0-5 0 - 5 /hpf   RBC 0-2 0 - 2 /hpf   Epithelial Cells (non renal) 0-10 0 - 10 /hpf   Bacteria, UA Few (A) None seen/Few  Urinalysis, Routine w reflex microscopic  Result Value Ref Range   Specific Gravity, UA 1.010 1.005 - 1.030   pH, UA 5.5 5.0 - 7.5   Color, UA Yellow Yellow   Appearance Ur Clear Clear   Leukocytes,UA Negative Negative   Protein,UA Negative Negative/Trace   Glucose, UA Negative Negative   Ketones, UA Negative Negative   RBC, UA Trace (A) Negative   Bilirubin, UA Negative Negative   Urobilinogen, Ur 0.2 0.2 - 1.0 mg/dL   Nitrite, UA Negative Negative   Microscopic Examination See below:   Basic metabolic panel  Result Value Ref Range   Glucose 65 65 - 99 mg/dL   BUN 12 6 - 24 mg/dL   Creatinine, Ser 0.79 0.57 - 1.00 mg/dL   GFR calc non Af Amer 82 >59 mL/min/1.73   GFR calc Af Amer 95 >59 mL/min/1.73   BUN/Creatinine Ratio 15 9 - 23    Sodium 137 134 - 144 mmol/L   Potassium 4.1 3.5 - 5.2 mmol/L   Chloride 101 96 - 106 mmol/L   CO2 24 20 - 29 mmol/L   Calcium 9.2 8.7 - 10.2 mg/dL  Lipid Panel w/o Chol/HDL Ratio  Result Value Ref Range   Cholesterol, Total 129 100 - 199 mg/dL   Triglycerides 54 0 - 149 mg/dL   HDL 52 >39 mg/dL   VLDL Cholesterol Cal 12 5 - 40 mg/dL   LDL Chol Calc (NIH) 65 0 - 99 mg/dL      Assessment & Plan:   Problem List Items Addressed This Visit      Other   Sore throat    Acute over past two weeks with current hoarseness and worsening symptoms, had 2 negative Covid tests at home, but reports fears about this and  fears about worsening.  At this time have recommended she immediately go to UC or ER today, due to worsening symptoms and multitude of symptoms presenting.  Would benefit from a face to face visit with lab testing and possible imaging to further assess and provider possible reassurance.  She had underlying anxiety about health issues.  She agrees to attend UC or ER today and will plan to follow-up with her after visit with them.  She agrees with this plan and stated appreciation for visit today.           I discussed the assessment and treatment plan with the patient. The patient was provided an opportunity to ask questions and all were answered. The patient agreed with the plan and demonstrated an understanding of the instructions.   The patient was advised to call back or seek an in-person evaluation if the symptoms worsen or if the condition fails to improve as anticipated.   I provided 21+ minutes of time during this encounter.  Follow up plan: Return for plan on follow-up after ER visit.

## 2020-11-03 NOTE — Assessment & Plan Note (Signed)
Acute over past two weeks with current hoarseness and worsening symptoms, had 2 negative Covid tests at home, but reports fears about this and fears about worsening.  At this time have recommended she immediately go to UC or ER today, due to worsening symptoms and multitude of symptoms presenting.  Would benefit from a face to face visit with lab testing and possible imaging to further assess and provider possible reassurance.  She had underlying anxiety about health issues.  She agrees to attend UC or ER today and will plan to follow-up with her after visit with them.  She agrees with this plan and stated appreciation for visit today.

## 2020-11-04 ENCOUNTER — Telehealth: Payer: Self-pay

## 2020-11-04 NOTE — Telephone Encounter (Signed)
Copied from [place] [id number]. Topic: General - Other >> Nov 04, 2020 12:15 PM Antonieta Iba C wrote: Reason for CRM: received call from [facility] with [facility]. Pt was told that her insurance is no longer accepted. Pt has [employer] PPO. Aaron Edelman would like to check into change and follow up with pt. Please assist further.   Is Jolene still in network with [facility] PPO?    CB: [phone] -- Aaron Edelman

## 2020-11-08 ENCOUNTER — Other Ambulatory Visit: Payer: Self-pay | Admitting: Nurse Practitioner

## 2020-11-08 ENCOUNTER — Telehealth: Payer: Self-pay

## 2020-11-08 DIAGNOSIS — R490 Dysphonia: Secondary | ICD-10-CM

## 2020-11-08 NOTE — Telephone Encounter (Signed)
Reviewed [facility] note -- they recommended she go to ENT, I have placed referral to ENT for patient.

## 2020-11-08 NOTE — Telephone Encounter (Signed)
I recommend she see ENT first, referral is in place, and she can ask to be in their wait list for cancellation to be seen sooner, if they have one.  I would recommend seeing them first, as per [facility] recommendations, to further assess.

## 2020-11-08 NOTE — Telephone Encounter (Signed)
Called pt advised of Jolene's message she states that she does want to stay with Dr Jerral Bonito

## 2020-11-08 NOTE — Telephone Encounter (Signed)
Called pt and she said that she has made an appt with Dr. Kathyrn Sheriff But it is scheduled for 4/5. She wants to be seen before then. Advised to her that referral was put in. She is wanting to see if she needs to go to the cancer center she would [facility] go to [facility] instead of [facility].

## 2020-11-08 NOTE — Telephone Encounter (Signed)
Please advise pt was referred by the hospital.   Copied from [place] [phone]. Topic: General - Other >> Nov 08, 2020  9:46 AM Keene Breath wrote: Reason for CRM: Patient would like to speak with the nurse or doctor regarding  referral to a cancer doctor.  She is not sure if she should go there or to an ENT.  Please advise and call to talk with patient further at [phone]

## 2020-11-12 NOTE — Telephone Encounter (Signed)
Call Lindsey Bautista he said that they moved the pt to dual complete and we are in network

## 2020-11-12 NOTE — Telephone Encounter (Signed)
Jolene is still participated with [employer].

## 2020-11-12 NOTE — Telephone Encounter (Signed)
Called Lindsey Bautista with [employer] no answer left vm

## 2020-11-17 ENCOUNTER — Telehealth: Payer: Self-pay

## 2020-11-17 NOTE — Telephone Encounter (Signed)
Patient was already scheduled for 11/23/20. You can cancel that referral. I did reach out to patient. She is having issues with her insurance and just wanted to make sure that referral was in.

## 2020-11-17 NOTE — Telephone Encounter (Signed)
Copied from [place] [id number]. Topic: Referral - Request for Referral >> Nov 17, 2020  9:09 AM Yvette Rack wrote: Has patient seen PCP for this complaint? yes  *If NO, is insurance requiring patient see PCP for this issue before PCP can refer them? Referral for which specialty: ENT Preferred provider/office: Dr. Kathyrn Sheriff in [facility]  Reason for referral: mass was found on patient throat

## 2020-11-17 NOTE — Telephone Encounter (Signed)
Anytime

## 2020-11-17 NOTE — Telephone Encounter (Signed)
Copied from [place] [phone]. Topic: General - Other >> Nov 17, 2020  1:44 PM Alanda Slim E wrote: Reason for CRM: Pt called and stated she spoke with [facility] insurance and they denied payment to cover her 3.16.22 visit / they advised her to contact the office and ask them to run the claim again because they are not sure why it was denied and office is in the Pt network/ please advise

## 2020-11-17 NOTE — Telephone Encounter (Signed)
Placed referral 11/08/20 for her, Lindsey Bautista can you check on this and alert patient.

## 2020-11-17 NOTE — Telephone Encounter (Signed)
Thank you Jessie.

## 2020-11-19 NOTE — Telephone Encounter (Signed)
Pt is calling to see if her insurance change back to regular medicare. Pt will contact medicare

## 2020-11-23 DIAGNOSIS — J342 Deviated nasal septum: Secondary | ICD-10-CM | POA: Diagnosis not present

## 2020-11-23 DIAGNOSIS — R490 Dysphonia: Secondary | ICD-10-CM | POA: Diagnosis not present

## 2020-11-23 DIAGNOSIS — F17200 Nicotine dependence, unspecified, uncomplicated: Secondary | ICD-10-CM | POA: Diagnosis not present

## 2020-12-14 DIAGNOSIS — R490 Dysphonia: Secondary | ICD-10-CM | POA: Diagnosis not present

## 2020-12-14 DIAGNOSIS — D3705 Neoplasm of uncertain behavior of pharynx: Secondary | ICD-10-CM | POA: Diagnosis not present

## 2020-12-22 ENCOUNTER — Other Ambulatory Visit: Payer: Self-pay | Admitting: Nurse Practitioner

## 2020-12-22 DIAGNOSIS — F419 Anxiety disorder, unspecified: Secondary | ICD-10-CM

## 2020-12-22 NOTE — Telephone Encounter (Signed)
Requested medication (s) are due for refill today:   Provider to review  Requested medication (s) are on the active medication list:   Yes  Future visit scheduled:   Yes   Last ordered: 09/29/2020 #60, 2 refills  Non delegated refill   Requested Prescriptions  Pending Prescriptions Disp Refills   clonazePAM (KLONOPIN) 1 MG tablet [Pharmacy Med Name: clonazePAM 1 MG Oral Tablet] 60 tablet 0    Sig: Take 1 tablet by mouth twice daily      Not Delegated - Psychiatry:  Anxiolytics/Hypnotics Failed - 12/22/2020  1:26 PM      Failed - This refill cannot be delegated      Failed - Urine Drug Screen completed in last 360 days      Passed - Valid encounter within last 6 months    Recent Outpatient Visits           1 month ago Sore throat   [place], Henrine Screws T, NP   2 months ago Centrilobular emphysema ([place])   [place], Chelan T, NP   4 months ago Generalized abdominal pain   [place], Waldo T, NP   6 months ago Centrilobular emphysema ([place])   [place], [place] T, NP   8 months ago Sinus pressure   [place], Barbaraann Faster, NP       Future Appointments             In 9 months [employer], [facility]

## 2020-12-22 NOTE — Telephone Encounter (Signed)
Unable to lvm to make this apt/  

## 2020-12-23 NOTE — Telephone Encounter (Signed)
PDMP review, last fill 11/25/20

## 2020-12-23 NOTE — Telephone Encounter (Signed)
Pt states she only has a couple pills left and can not come in the office due to transportation issues is scheduled for phone call apt as she states she would not be able to come in. Would a phone call apt be ok for this ? Apt on 12/27/2020 at 3:40pm

## 2020-12-25 ENCOUNTER — Encounter: Payer: Self-pay | Admitting: Nurse Practitioner

## 2020-12-27 ENCOUNTER — Ambulatory Visit (INDEPENDENT_AMBULATORY_CARE_PROVIDER_SITE_OTHER): Payer: Medicare Other | Admitting: Nurse Practitioner

## 2020-12-27 ENCOUNTER — Encounter: Payer: Self-pay | Admitting: Nurse Practitioner

## 2020-12-27 DIAGNOSIS — I700 Atherosclerosis of aorta: Secondary | ICD-10-CM

## 2020-12-27 DIAGNOSIS — E782 Mixed hyperlipidemia: Secondary | ICD-10-CM

## 2020-12-27 DIAGNOSIS — F17210 Nicotine dependence, cigarettes, uncomplicated: Secondary | ICD-10-CM

## 2020-12-27 DIAGNOSIS — J432 Centrilobular emphysema: Secondary | ICD-10-CM | POA: Diagnosis not present

## 2020-12-27 DIAGNOSIS — F1320 Sedative, hypnotic or anxiolytic dependence, uncomplicated: Secondary | ICD-10-CM | POA: Diagnosis not present

## 2020-12-27 DIAGNOSIS — F339 Major depressive disorder, recurrent, unspecified: Secondary | ICD-10-CM

## 2020-12-27 DIAGNOSIS — Z79899 Other long term (current) drug therapy: Secondary | ICD-10-CM

## 2020-12-27 DIAGNOSIS — F419 Anxiety disorder, unspecified: Secondary | ICD-10-CM

## 2020-12-27 MED ORDER — LINACLOTIDE 72 MCG PO CAPS
ORAL_CAPSULE | ORAL | 4 refills | Status: DC
Start: 2020-12-27 — End: 2022-01-09

## 2020-12-27 MED ORDER — ESTRADIOL 0.5 MG PO TABS
0.5000 mg | ORAL_TABLET | Freq: Every day | ORAL | 4 refills | Status: DC
Start: 2020-12-27 — End: 2022-01-09

## 2020-12-27 MED ORDER — EZETIMIBE 10 MG PO TABS
10.0000 mg | ORAL_TABLET | Freq: Every day | ORAL | 4 refills | Status: DC
Start: 2020-12-27 — End: 2021-05-03

## 2020-12-27 MED ORDER — ATORVASTATIN CALCIUM 80 MG PO TABS
80.0000 mg | ORAL_TABLET | Freq: Every day | ORAL | 4 refills | Status: DC
Start: 2020-12-27 — End: 2021-05-03

## 2020-12-27 MED ORDER — FLUOXETINE HCL 40 MG PO CAPS
40.0000 mg | ORAL_CAPSULE | Freq: Every day | ORAL | 4 refills | Status: DC
Start: 2020-12-27 — End: 2022-01-09

## 2020-12-27 NOTE — Assessment & Plan Note (Signed)
Chronic, ongoing.  Continue Klonopin at this time, patient educated on risks of medication and is not interested in reducing -- recent refills sent in.  Continue Prozac 40 MG which benefist anxiety and fibromyalgia, discussed with her changing to Zoloft or Duloxetine if increase anxiety/depression noted.  At this time she denies SI/HI and wishes to maintain current regimen.  UDS next visit for yearly and contract up to date October 2021.  Return in 3 months.

## 2020-12-27 NOTE — Assessment & Plan Note (Signed)
Chronic, stable.  Continue current medication regimen and adjust as needed.  Denies SI/HI.  Consider change from Prozac to Zoloft in future, which would offer benefit with anxiety and depression.  At this time she wishes not to change regimen.

## 2020-12-27 NOTE — Assessment & Plan Note (Signed)
Chronic, ongoing, noted on October 2019 lung screening CT.  Continue statin and ASA daily + recommend complete cessation of smoking. 

## 2020-12-27 NOTE — Progress Notes (Signed)
There were no vitals taken for this visit.   Subjective:    Patient ID: Lindsey Bautista, female    DOB: 1961/02/16, 60 y.o.   MRN: [id number]  HPI: Lindsey Bautista is a 60 y.o. female  Chief Complaint  Patient presents with  . Medication Refill    Patient is requesting refills on her medication.     . This visit was completed via telephone due to the restrictions of the COVID-19 pandemic. All issues as above were discussed and addressed but no physical exam was performed. If it was felt that the patient should be evaluated in the office, they were directed there. The patient verbally consented to this visit. Patient was unable to complete an audio/visual visit due to Lack of equipment. Due to the catastrophic nature of the COVID-19 pandemic, this visit was done through audio contact only. . Location of the patient: home . Location of the provider: work . Those involved with this call:  . Provider: Marnee Guarneri, DNP . CMA: Irena Reichmann, CMA . Front Desk/Registration: Roe Rutherford  . Time spent on call: 21 minutes on the phone discussing health concerns. 15 minutes total spent in review of patient's record and preparation of their chart.  . I verified patient identity using two factors (patient name and date of birth). Patient consents verbally to being seen via telemedicine visit today.    DEPRESSION/ANXIETY Continues on Prozac 20 MG daily and Klonopin 1 MG BID. Pt isaware of risks of benzomedication use to include increased sedation, respiratory suppression, falls, dependence and cardiovascular events.Ptwould like to continue treatment as benefit determined to outweigh risk. On PDMP review last Klonopin fill 12/23/20 for #60.  Mood status:stable Satisfied with current treatment?:yes Symptom severity:mild Duration of current treatment :chronic Side effects:no Medication compliance:good compliance Psychotherapy/counseling:none Previous psychiatric medications:Prozac  and Klonopin Depressed mood:no Anxious mood:yes Anhedonia:no Significant weight loss or gain:no Insomnia:yeshard to fall asleep Fatigue:at times Feelings of worthlessness or guilt: none Impaired concentration/indecisiveness:yes Suicidal ideations:no Hopelessness:no Crying spells:no Depression screen [facility] 2/9 12/27/2020 09/29/2020 09/27/2020 08/11/2020 06/01/2020  Decreased Interest 0 0 3 0 1  Down, Depressed, Hopeless 1 1 0 1 1  PHQ - 2 Score 1 1 3 1 2   Altered sleeping 1 1 1 2  0  Tired, decreased energy 0 1 3 3 1   Change in appetite 0 1 0 2 0  Feeling bad or failure about yourself  0 0 0 0 1  Trouble concentrating 0 0 3 1 1   Moving slowly or fidgety/restless 0 0 0 0 1  Suicidal thoughts 0 0 0 0 0  PHQ-9 Score 2 4 10 9 6   Difficult doing work/chores Not difficult at all - Somewhat difficult Somewhat difficult Somewhat difficult  Some recent data might be hidden   GAD 7 : Generalized Anxiety Score 12/27/2020 09/29/2020 06/01/2020 03/01/2020  Nervous, Anxious, on Edge 2 2 2 1   Control/stop worrying 2 2 3 1   Worry too much - different things 2 2 3 1   Trouble relaxing 1 1 3  0  Restless 1 1 1  0  Easily annoyed or irritable 1 2 3 1   Afraid - awful might happen 1 1 3 2   Total GAD 7 Score 10 11 18 6   Anxiety Difficulty Somewhat difficult Somewhat difficult Very difficult Somewhat difficult   HYPERLIPIDEMIA Currently on Zetia, last saw Dr. Rockey Situ on 05/03/20. Atorvastatin 80 MG daily and Zetia 10 MG daily.  Last lipid panel in February 2022 -- LDL 65. Hyperlipidemia status:good compliance  Satisfied with current treatment?yes Side effects:no Medication compliance:good compliance Past cholesterol meds:Atorvastatin and Zetia Supplements:none Aspirin:yes The ASCVD Risk score Mikey Bussing DC Jr., et al., 2013) failed to calculate for the following reasons:   The valid total cholesterol range is 130 to 320 mg/dL Chest pain:no Coronary artery disease:no Family history  CAD:no Family history early CAD:no  COPD No current inhaler regimen.  Had lung CT screening last 06/01/2020, last year noted mild centrilobular emphysema and aortic atherosclerosis. Currently smoking 1 PPD or a little a less, has smoked for 40 years.  Not interested in quitting.   COPD status: stable Satisfied with current treatment?: yes Oxygen use: no Dyspnea frequency:  Cough frequency:  Rescue inhaler frequency:   Limitation of activity: no Productive cough:  Last Spirometry: none recent Pneumovax: refused Influenza: refused   Relevant past medical, surgical, family and social history reviewed and updated as indicated. Interim medical history since our last visit reviewed. Allergies and medications reviewed and updated.  Review of Systems  Constitutional: Negative for activity change, appetite change, diaphoresis, fatigue and fever.  Respiratory: Negative for cough, chest tightness, shortness of breath and wheezing.   Cardiovascular: Negative for chest pain, palpitations and leg swelling.  Gastrointestinal: Negative.   Endocrine: Negative for cold intolerance and heat intolerance.  Neurological: Negative.   Psychiatric/Behavioral: Negative for decreased concentration, self-injury, sleep disturbance and suicidal ideas. The patient is nervous/anxious.     Per HPI unless specifically indicated above     Objective:    There were no vitals taken for this visit.  Wt Readings from Last 3 Encounters:  09/29/20 143 lb (64.9 kg)  09/27/20 140 lb (63.5 kg)  08/11/20 143 lb 9.6 oz (65.1 kg)    Physical Exam   Unable to perform due to telephone visit only.  Results for orders placed or performed in visit on 09/29/20  WET PREP FOR [place], YEAST, CLUE   Specimen: Sterile Swab   Sterile Swab  Result Value Ref Range   Trichomonas Exam Negative Negative   Yeast Exam Negative Negative   Clue Cell Exam Positive (A) Negative  Microscopic Examination   Urine  Result Value Ref  Range   WBC, UA 0-5 0 - 5 /hpf   RBC 0-2 0 - 2 /hpf   Epithelial Cells (non renal) 0-10 0 - 10 /hpf   Bacteria, UA Few (A) None seen/Few  Urinalysis, Routine w reflex microscopic  Result Value Ref Range   Specific Gravity, UA 1.010 1.005 - 1.030   pH, UA 5.5 5.0 - 7.5   Color, UA Yellow Yellow   Appearance Ur Clear Clear   Leukocytes,UA Negative Negative   Protein,UA Negative Negative/Trace   Glucose, UA Negative Negative   Ketones, UA Negative Negative   RBC, UA Trace (A) Negative   Bilirubin, UA Negative Negative   Urobilinogen, Ur 0.2 0.2 - 1.0 mg/dL   Nitrite, UA Negative Negative   Microscopic Examination See below:   Basic metabolic panel  Result Value Ref Range   Glucose 65 65 - 99 mg/dL   BUN 12 6 - 24 mg/dL   Creatinine, Ser 0.79 0.57 - 1.00 mg/dL   GFR calc non Af Amer 82 >59 mL/min/1.73   GFR calc Af Amer 95 >59 mL/min/1.73   BUN/Creatinine Ratio 15 9 - 23   Sodium 137 134 - 144 mmol/L   Potassium 4.1 3.5 - 5.2 mmol/L   Chloride 101 96 - 106 mmol/L   CO2 24 20 - 29 mmol/L   Calcium  9.2 8.7 - 10.2 mg/dL  Lipid Panel w/o Chol/HDL Ratio  Result Value Ref Range   Cholesterol, Total 129 100 - 199 mg/dL   Triglycerides 54 0 - 149 mg/dL   HDL 52 >39 mg/dL   VLDL Cholesterol Cal 12 5 - 40 mg/dL   LDL Chol Calc (NIH) 65 0 - 99 mg/dL      Assessment & Plan:   Problem List Items Addressed This Visit      Cardiovascular and Mediastinum   Aortic atherosclerosis (HCC)    Chronic, ongoing, noted on October 2019 lung screening CT.  Continue statin and ASA daily + recommend complete cessation of smoking.      Relevant Medications   atorvastatin (LIPITOR) 80 MG tablet   ezetimibe (ZETIA) 10 MG tablet     Respiratory   Centrilobular emphysema (HCC) - Primary    Chronic, ongoing.  No current inhaler regimen.  Denies any symptoms.  Continue yearly lung screening and plan on spirometry next visit to assess function.  Recommend complete cessation of smoking.         Other   Nicotine dependence, cigarettes, uncomplicated    I have recommended complete cessation of tobacco use. I have discussed various options available for assistance with tobacco cessation including over the counter methods (Nicotine gum, patch and lozenges). We also discussed prescription options (Chantix, Nicotine Inhaler / Nasal Spray). The patient is not interested in pursuing any prescription tobacco cessation options at this time.  Continue annual lung CT screening.       Depression, recurrent (HCC)    Chronic, stable.  Continue current medication regimen and adjust as needed.  Denies SI/HI.  Consider change from Prozac to Zoloft in future, which would offer benefit with anxiety and depression.  At this time she wishes not to change regimen.      Relevant Medications   FLUoxetine (PROZAC) 40 MG capsule   High risk medication use    Long term benzo use.  Obtain UDS next visit due and annually + obtain controlled substance contract up to date October 2021.      Hyperlipidemia    Chronic, ongoing.  Continue current medication regimen and collaboration with cardiology.  Lipid panel up to date and LDL at goal.      Relevant Medications   atorvastatin (LIPITOR) 80 MG tablet   ezetimibe (ZETIA) 10 MG tablet   Anxiety    Chronic, ongoing.  Continue Klonopin at this time, patient educated on risks of medication and is not interested in reducing -- recent refills sent in.  Continue Prozac 40 MG which benefist anxiety and fibromyalgia, discussed with her changing to Zoloft or Duloxetine if increase anxiety/depression noted.  At this time she denies SI/HI and wishes to maintain current regimen.  UDS next visit for yearly and contract up to date October 2021.  Return in 3 months.      Relevant Medications   FLUoxetine (PROZAC) 40 MG capsule   Benzodiazepine dependence, continuous ([place])    Refer to anxiety plan for updates and discussion.         I discussed the assessment and  treatment plan with the patient. The patient was provided an opportunity to ask questions and all were answered. The patient agreed with the plan and demonstrated an understanding of the instructions.   The patient was advised to call back or seek an in-person evaluation if the symptoms worsen or if the condition fails to improve as anticipated.   I provided 21+  minutes of time during this encounter.  Follow up plan: Return in about 3 months (around 03/29/2021) for COPD and ANXIETY == need spirometry and UDS.

## 2020-12-27 NOTE — Assessment & Plan Note (Signed)
Refer to anxiety plan for updates and discussion.

## 2020-12-27 NOTE — Patient Instructions (Signed)
[email or web address].NIH.Gov">  Generalized Anxiety Disorder, Adult Generalized anxiety disorder (GAD) is a mental health condition. Unlike normal worries, anxiety related to GAD is not triggered by a specific event. These worries do not fade or get better with time. GAD interferes with relationships, work, and school. GAD symptoms can vary from mild to severe. People with severe GAD can have intense waves of anxiety with physical symptoms that are similar to panic attacks. What are the causes? The exact cause of GAD is not known, but the following are believed to have an impact:  Differences in natural brain chemicals.  Genes passed down from parents to children.  Differences in the way threats are perceived.  Development during childhood.  Personality. What increases the risk? The following factors may make you more likely to develop this condition:  Being female.  Having a family history of anxiety disorders.  Being very shy.  Experiencing very stressful life events, such as the death of a loved one.  Having a very stressful family environment. What are the signs or symptoms? People with GAD often worry excessively about many things in their lives, such as their health and family. Symptoms may also include:  Mental and emotional symptoms: ? Worrying excessively about natural disasters. ? Fear of being late. ? Difficulty concentrating. ? Fears that others are judging your performance.  Physical symptoms: ? Fatigue. ? Headaches, muscle tension, muscle twitches, trembling, or feeling shaky. ? Feeling like your heart is pounding or beating very fast. ? Feeling out of breath or like you cannot take a deep breath. ? Having trouble falling asleep or staying asleep, or experiencing restlessness. ? Sweating. ? Nausea, diarrhea, or irritable bowel syndrome (IBS).  Behavioral symptoms: ? Experiencing erratic moods or irritability. ? Avoidance of new situations. ? Avoidance of  people. ? Extreme difficulty making decisions. How is this diagnosed? This condition is diagnosed based on your symptoms and medical history. You will also have a physical exam. Your health care provider may perform tests to rule out other possible causes of your symptoms. To be diagnosed with GAD, a person must have anxiety that:  Is out of his or her control.  Affects several different aspects of his or her life, such as work and relationships.  Causes distress that makes him or her unable to take part in normal activities.  Includes at least three symptoms of GAD, such as restlessness, fatigue, trouble concentrating, irritability, muscle tension, or sleep problems. Before your health care provider can confirm a diagnosis of GAD, these symptoms must be present more days than they are not, and they must last for 6 months or longer. How is this treated? This condition may be treated with:  Medicine. Antidepressant medicine is usually prescribed for long-term daily control. Anti-anxiety medicines may be added in severe cases, especially when panic attacks occur.  Talk therapy (psychotherapy). Certain types of talk therapy can be helpful in treating GAD by providing support, education, and guidance. Options include: ? Cognitive behavioral therapy (CBT). People learn coping skills and self-calming techniques to ease their physical symptoms. They learn to identify unrealistic thoughts and behaviors and to replace them with more appropriate thoughts and behaviors. ? Acceptance and commitment therapy (ACT). This treatment teaches people how to be mindful as a way to cope with unwanted thoughts and feelings. ? Biofeedback. This process trains you to manage your body's response (physiological response) through breathing techniques and relaxation methods. You will work with a therapist while machines are used to monitor your physical   symptoms.  Stress management techniques. These include yoga,  meditation, and exercise. A mental health specialist can help determine which treatment is best for you. Some people see improvement with one type of therapy. However, other people require a combination of therapies.   Follow these instructions at home: Lifestyle  Maintain a consistent routine and schedule.  Anticipate stressful situations. Create a plan, and allow extra time to work with your plan.  Practice stress management or self-calming techniques that you have learned from your therapist or your health care provider. General instructions  Take over-the-counter and prescription medicines only as told by your health care provider.  Understand that you are likely to have setbacks. Accept this and be kind to yourself as you persist to take better care of yourself.  Recognize and accept your accomplishments, even if you judge them as small.  Keep all follow-up visits as told by your health care provider. This is important. Contact a health care provider if:  Your symptoms do not get better.  Your symptoms get worse.  You have signs of depression, such as: ? A persistently sad or irritable mood. ? Loss of enjoyment in activities that used to bring you joy. ? Change in weight or eating. ? Changes in sleeping habits. ? Avoiding friends or family members. ? Loss of energy for normal tasks. ? Feelings of guilt or worthlessness. Get help right away if:  You have serious thoughts about hurting yourself or others. If you ever feel like you may hurt yourself or others, or have thoughts about taking your own life, get help right away. Go to your nearest emergency department or:  Call your local emergency services (911 in the [place]  Call a suicide crisis helpline, such as the National Suicide Prevention Lifeline at 1-800-273-8255. This is open 24 hours a day in the [place]  Text the Crisis Text Line at 741741 (in the [place] Summary  Generalized anxiety disorder (GAD) is a mental  health condition that involves worry that is not triggered by a specific event.  People with GAD often worry excessively about many things in their lives, such as their health and family.  GAD may cause symptoms such as restlessness, trouble concentrating, sleep problems, frequent sweating, nausea, diarrhea, headaches, and trembling or muscle twitching.  A mental health specialist can help determine which treatment is best for you. Some people see improvement with one type of therapy. However, other people require a combination of therapies. This information is not intended to replace advice given to you by your health care provider. Make sure you discuss any questions you have with your health care provider. Document Revised: 05/28/2019 Document Reviewed: 05/28/2019 Elsevier Patient Education  2021 Elsevier Inc.  

## 2020-12-27 NOTE — Assessment & Plan Note (Signed)
Chronic, ongoing.  Continue current medication regimen and collaboration with cardiology.  Lipid panel up to date and LDL at goal.

## 2020-12-27 NOTE — Assessment & Plan Note (Signed)
I have recommended complete cessation of tobacco use. I have discussed various options available for assistance with tobacco cessation including over the counter methods (Nicotine gum, patch and lozenges). We also discussed prescription options (Chantix, Nicotine Inhaler / Nasal Spray). The patient is not interested in pursuing any prescription tobacco cessation options at this time.  Continue annual lung CT screening. ? ?

## 2020-12-27 NOTE — Assessment & Plan Note (Addendum)
Long term benzo use.  Obtain UDS next visit due and annually + obtain controlled substance contract up to date October 2021.

## 2020-12-27 NOTE — Assessment & Plan Note (Signed)
Chronic, ongoing.  No current inhaler regimen.  Denies any symptoms.  Continue yearly lung screening and plan on spirometry next visit to assess function.  Recommend complete cessation of smoking.   

## 2020-12-28 ENCOUNTER — Telehealth: Payer: Self-pay

## 2020-12-28 NOTE — Telephone Encounter (Signed)
Pt stated she would call back to make this apt. 

## 2020-12-28 NOTE — Telephone Encounter (Signed)
-----   Message from Venita Lick, NP sent at 12/27/2020  4:05 PM EDT ----- 3 month follow-up needed

## 2021-01-15 IMAGING — CT CT HEAD W/O CM
3 series · 16 of 46 positions shown, 19 images · non-contrast
Comparison: 06/12/2018

CLINICAL DATA: Headache with left facial tingling and right hand
numbness worsening today. Possible trauma.

EXAM:
CT HEAD WITHOUT CONTRAST
TECHNIQUE: Contiguous axial images were obtained from the base of the skull
through the vertex without intravenous contrast.

[Series 2: head wo · axial · 0.47mm/px · z∈[-138,-18]mm · 10 of 29 slices shown, 13 images]
[im 3/29  brain]
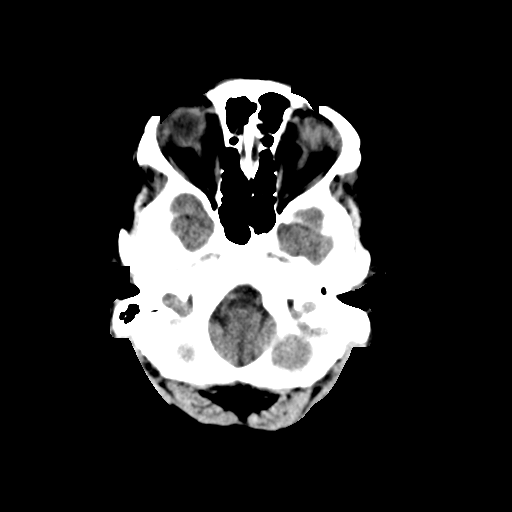
[im 3/29  bone]
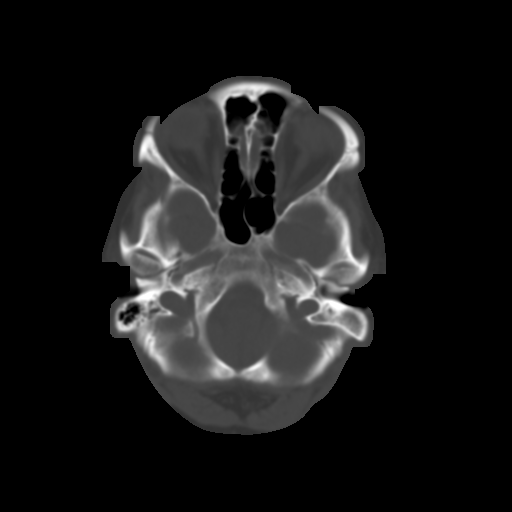
[im 6/29  brain]
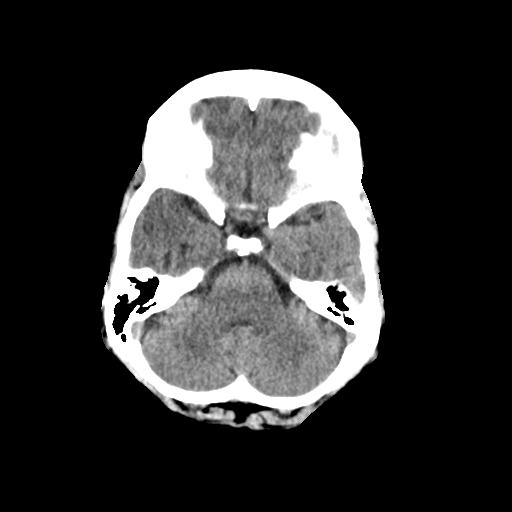
[im 8/29  brain]
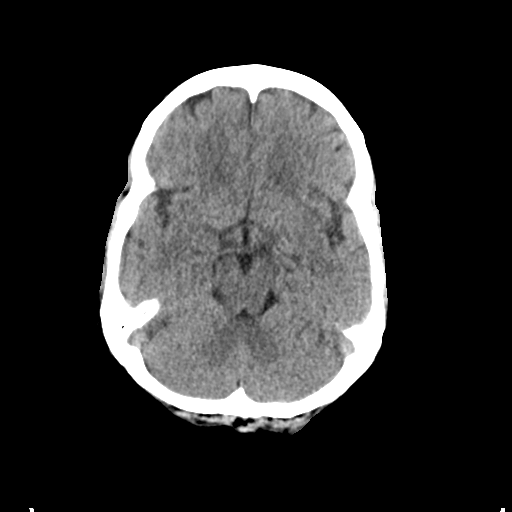
[im 11/29  brain]
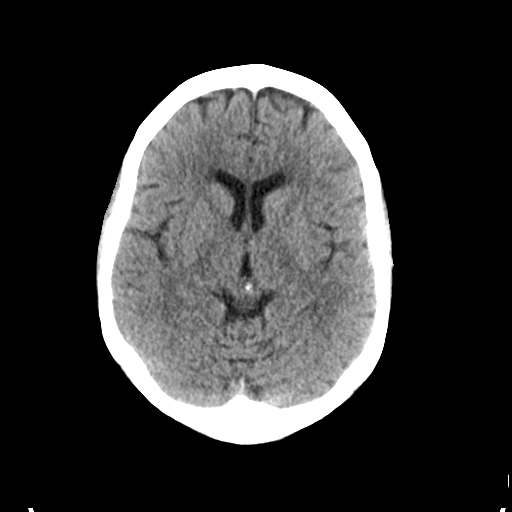
[im 14/29  brain]
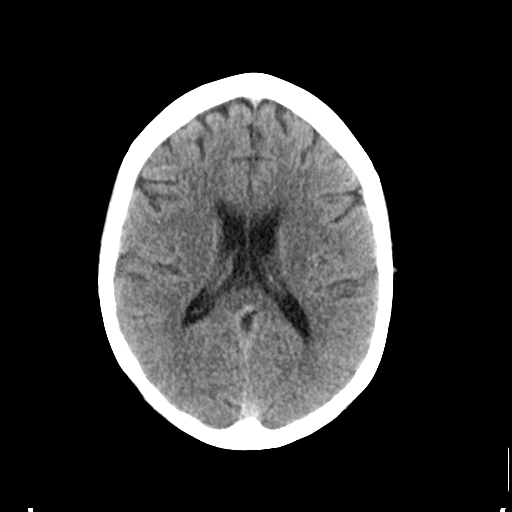
[im 14/29  bone]
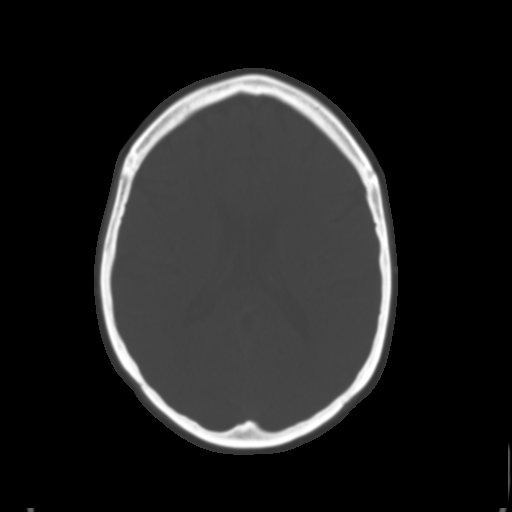
[im 16/29  brain]
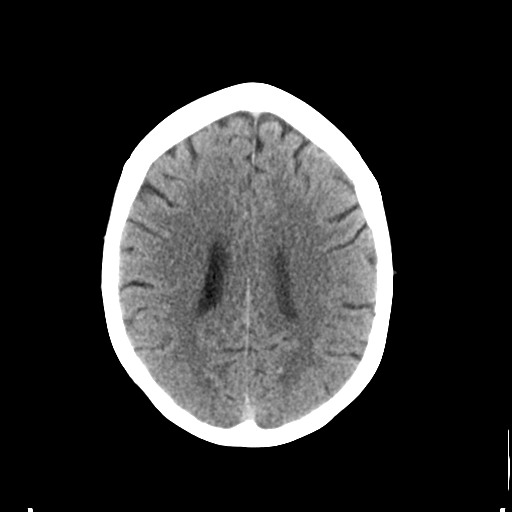
[im 19/29  brain]
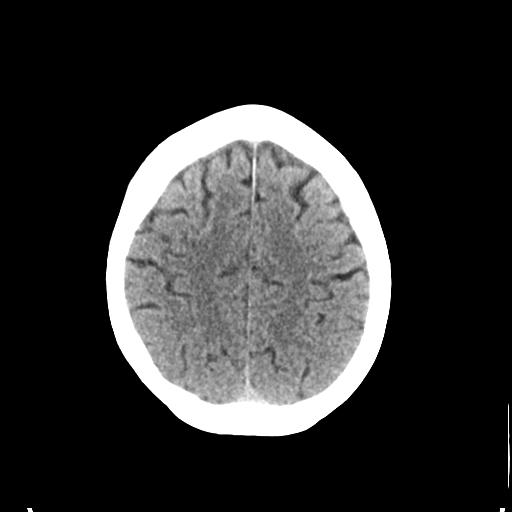
[im 22/29  brain]
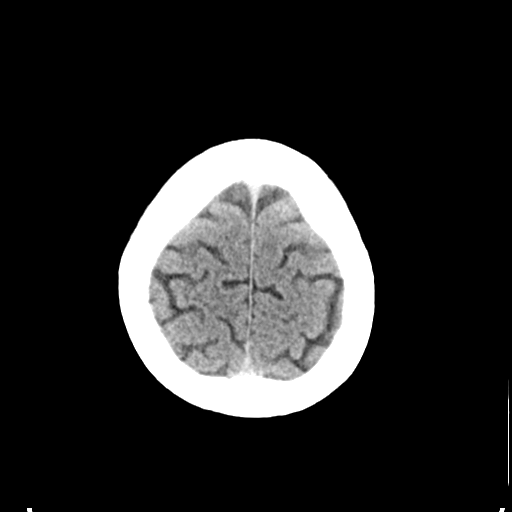
[im 24/29  brain]
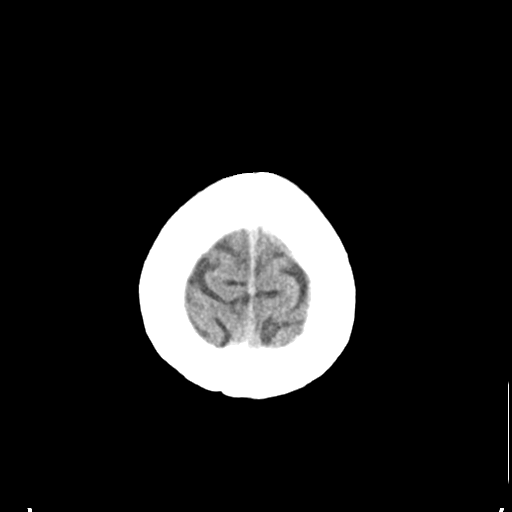
[im 24/29  bone]
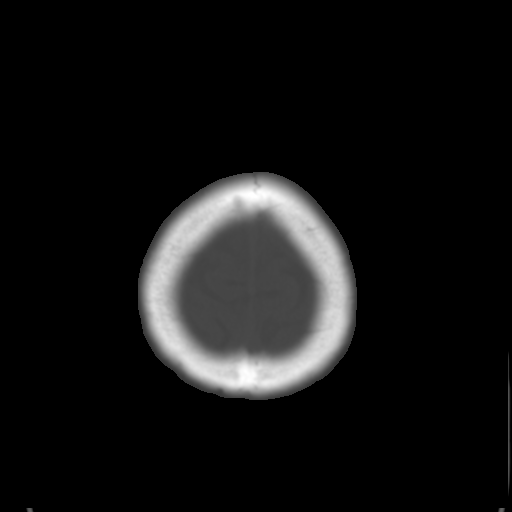
[im 27/29  brain]
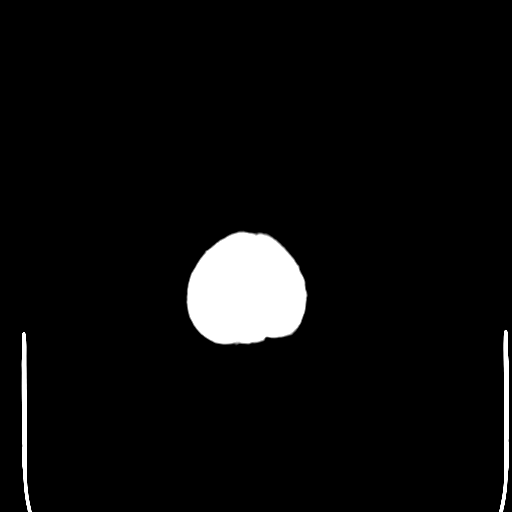

[Series 4: coronal soft tissue · coronal · 0.28mm/px · 3 of 64 slices shown]
[im 22/64  brain]
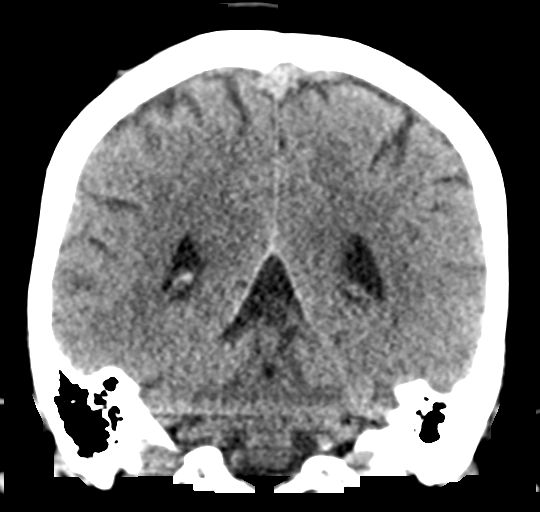
[im 29/64  brain]
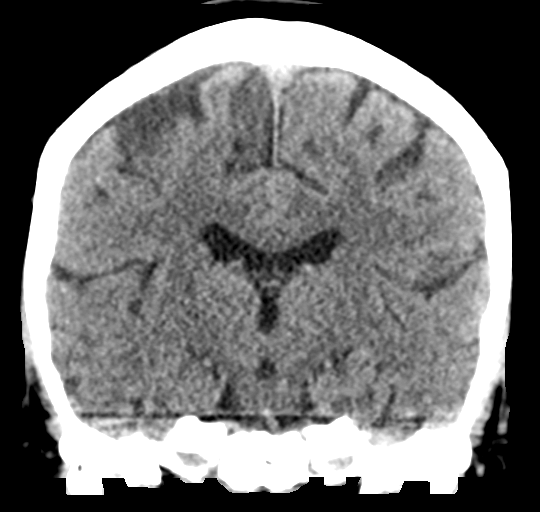
[im 36/64  brain]
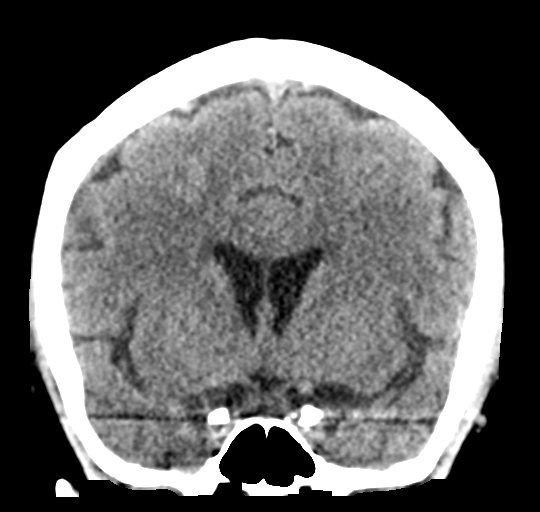

[Series 5: sagittal soft tissue · sagittal · 0.28mm/px · 3 of 52 slices shown]
[im 18/52  brain]
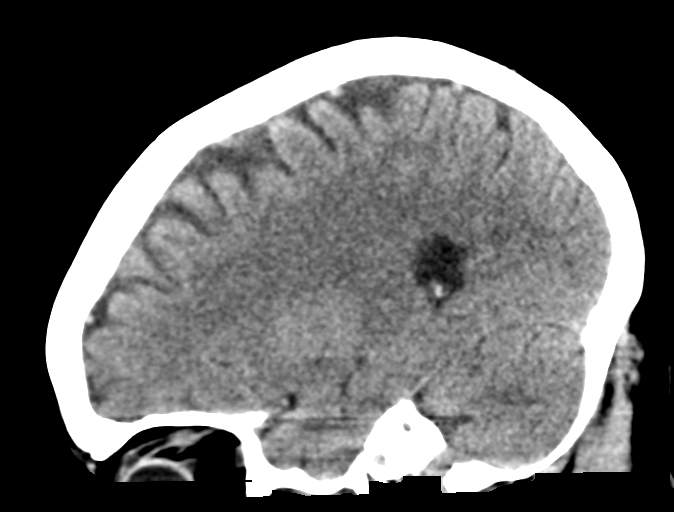
[im 26/52  brain]
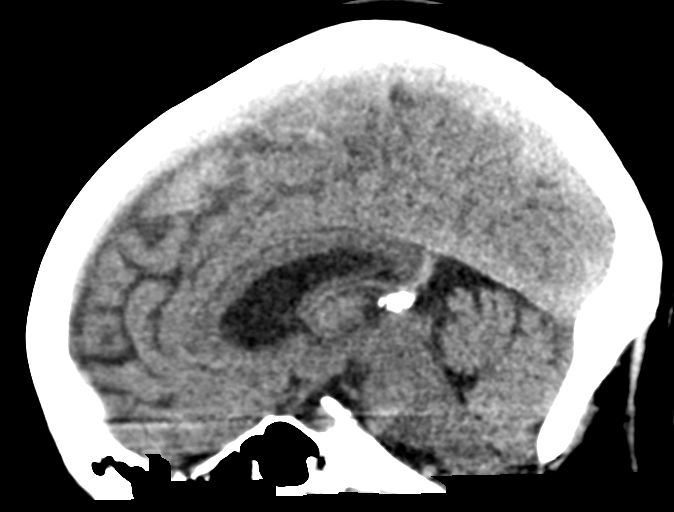
[im 35/52  brain]
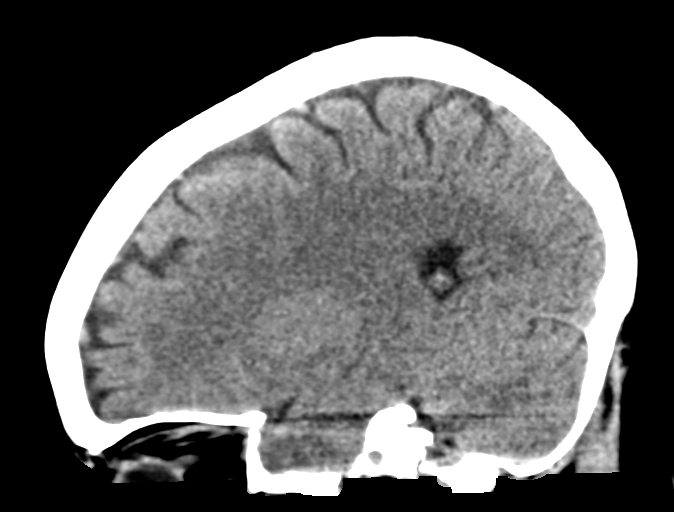

[16 of 46 positions shown; findings below may reference images not displayed]

FINDINGS: Brain: Ventricles, cisterns and other CSF spaces are normal. There
is no mass, mass effect, shift of midline structures or acute
hemorrhage. No evidence of acute infarction.

Vascular: No hyperdense vessel or unexpected calcification.

Skull: Normal. Negative for fracture or focal lesion.

Sinuses/Orbits: No acute finding.

Other: None.
IMPRESSION: Normal head CT.

## 2021-02-16 ENCOUNTER — Encounter: Payer: Self-pay | Admitting: Nurse Practitioner

## 2021-02-16 ENCOUNTER — Ambulatory Visit (INDEPENDENT_AMBULATORY_CARE_PROVIDER_SITE_OTHER): Payer: Medicare Other | Admitting: Nurse Practitioner

## 2021-02-16 ENCOUNTER — Other Ambulatory Visit: Payer: Self-pay

## 2021-02-16 VITALS — BP 126/75 | HR 59 | Temp 98.7°F | Wt 143.8 lb

## 2021-02-16 DIAGNOSIS — J432 Centrilobular emphysema: Secondary | ICD-10-CM

## 2021-02-16 DIAGNOSIS — F17210 Nicotine dependence, cigarettes, uncomplicated: Secondary | ICD-10-CM

## 2021-02-16 DIAGNOSIS — F419 Anxiety disorder, unspecified: Secondary | ICD-10-CM

## 2021-02-16 DIAGNOSIS — F339 Major depressive disorder, recurrent, unspecified: Secondary | ICD-10-CM

## 2021-02-16 DIAGNOSIS — I700 Atherosclerosis of aorta: Secondary | ICD-10-CM | POA: Diagnosis not present

## 2021-02-16 DIAGNOSIS — F1320 Sedative, hypnotic or anxiolytic dependence, uncomplicated: Secondary | ICD-10-CM | POA: Diagnosis not present

## 2021-02-16 DIAGNOSIS — I2510 Atherosclerotic heart disease of native coronary artery without angina pectoris: Secondary | ICD-10-CM

## 2021-02-16 DIAGNOSIS — E782 Mixed hyperlipidemia: Secondary | ICD-10-CM

## 2021-02-16 MED ORDER — CLONAZEPAM 1 MG PO TABS: 1.0000 mg | ORAL_TABLET | Freq: Two times a day (BID) | ORAL | 2 refills | Status: DC

## 2021-02-16 NOTE — Assessment & Plan Note (Signed)
Chronic, stable.  Continue current medication regimen and adjust as needed.  Denies SI/HI.  Consider change from Prozac to Zoloft in future, which would offer benefit with anxiety and depression.  At this time she wishes not to change regimen.

## 2021-02-16 NOTE — Assessment & Plan Note (Signed)
Refer to anxiety plan for updates and discussion.

## 2021-02-16 NOTE — Assessment & Plan Note (Signed)
Chronic, ongoing, noted on October 2019 lung screening CT.  Continue statin and ASA daily + recommend complete cessation of smoking. 

## 2021-02-16 NOTE — Assessment & Plan Note (Addendum)
Chronic, ongoing.  No current inhaler regimen.  Denies any symptoms.  Continue yearly lung screening and plan on spirometry next visit to assess function.  Recommend complete cessation of smoking.  CBC and TSH today.

## 2021-02-16 NOTE — Progress Notes (Signed)
BP 126/75   Pulse (!) 59   Temp 98.7 F (37.1 C) (Oral)   Wt 143 lb 12.8 oz (65.2 kg)   SpO2 97%   BMI 26.67 kg/m    Subjective:    Patient ID: Lindsey Bautista, female    DOB: 30-Jul-1961, 60 y.o.   MRN: [id number]  HPI: Lindsey Bautista is a 60 y.o. female  Chief Complaint  Patient presents with   Medication Refill    Patient states she is her for medication refills.    DEPRESSION/ANXIETY Continues on Prozac 40 MG daily and Klonopin 1 MG BID.  Pt is aware of risks of benzo medication use to include increased sedation, respiratory suppression, falls, dependence and cardiovascular events.  Pt would like to continue treatment as benefit determined to outweigh risk.  On PDMP review last Klonopin fill 01/19/21 for #60.  No other controlled substances noted.  Overall mood well-controlled with this.   Mood status: stable Satisfied with current treatment?: yes Symptom severity: mild  Duration of current treatment : chronic Side effects: no Medication compliance: good compliance Psychotherapy/counseling: none Previous psychiatric medications: Prozac and Klonopin Depressed mood: no Anxious mood: yes Anhedonia: no Significant weight loss or gain: no Insomnia: yes hard to fall asleep Fatigue: at times Feelings of worthlessness or guilt: none Impaired concentration/indecisiveness: yes Suicidal ideations: no Hopelessness: no Crying spells: no Depression screen [facility] 2/9 02/16/2021 12/27/2020 09/29/2020 09/27/2020 08/11/2020  Decreased Interest 0 0 0 3 0  Down, Depressed, Hopeless 0 1 1 0 1  PHQ - 2 Score 0 1 1 3 1   Altered sleeping 1 1 1 1 2   Tired, decreased energy 0 0 1 3 3   Change in appetite 0 0 1 0 2  Feeling bad or failure about yourself  0 0 0 0 0  Trouble concentrating 0 0 0 3 1  Moving slowly or fidgety/restless 0 0 0 0 0  Suicidal thoughts 0 0 0 0 0  PHQ-9 Score 1 2 4 10 9   Difficult doing work/chores Not difficult at all Not difficult at all - Somewhat difficult Somewhat  difficult  Some recent data might be hidden   GAD 7 : Generalized Anxiety Score 02/16/2021 12/27/2020 09/29/2020 06/01/2020  Nervous, Anxious, on Edge 2 2 2 2   Control/stop worrying 2 2 2 3   Worry too much - different things 1 2 2 3   Trouble relaxing 1 1 1 3   Restless 1 1 1 1   Easily annoyed or irritable 1 1 2 3   Afraid - awful might happen 1 1 1 3   Total GAD 7 Score 9 10 11 18   Anxiety Difficulty Somewhat difficult Somewhat difficult Somewhat difficult Very difficult   HYPERLIPIDEMIA Continues on Atorvastatin, ASA, and Zetia, returns to Dr. Rockey Situ in September.  Last saw him 05/03/2020. Hyperlipidemia status: good compliance Satisfied with current treatment?  yes Side effects:  no Medication compliance: good compliance Past cholesterol meds: Atorvastatin and Zetia Supplements: none Aspirin:  yes The ASCVD Risk score Mikey Bussing DC Jr., et al., 2013) failed to calculate for the following reasons:   The valid total cholesterol range is 130 to 320 mg/dL Chest pain:  no Coronary artery disease:  no Family history CAD:  no Family history early CAD:  no   COPD Noted on lung screening = centrilobular emphysema and aortic atherosclerosis -- last screening in October 2021. No current inhalers.  Currently smoking 1 PPD, has smoked for >40 years.  Not interested in quitting.   COPD status:  stable Satisfied with current treatment?: yes Oxygen use: no Dyspnea frequency:  Cough frequency:  Rescue inhaler frequency:   Limitation of activity: no Productive cough:  Last Spirometry: none recent Pneumovax:  refused Influenza:  refused    Relevant past medical, surgical, family and social history reviewed and updated as indicated. Interim medical history since our last visit reviewed. Allergies and medications reviewed and updated.  Review of Systems  Constitutional:  Negative for activity change, appetite change, diaphoresis, fatigue and fever.  Respiratory:  Negative for cough, chest tightness,  shortness of breath and wheezing.   Cardiovascular:  Negative for chest pain, palpitations and leg swelling.  Gastrointestinal: Negative.   Endocrine: Negative for cold intolerance and heat intolerance.  Neurological: Negative.   Psychiatric/Behavioral:  Negative for decreased concentration, self-injury, sleep disturbance and suicidal ideas. The patient is nervous/anxious.    Per HPI unless specifically indicated above     Objective:    BP 126/75   Pulse (!) 59   Temp 98.7 F (37.1 C) (Oral)   Wt 143 lb 12.8 oz (65.2 kg)   SpO2 97%   BMI 26.67 kg/m   Wt Readings from Last 3 Encounters:  02/16/21 143 lb 12.8 oz (65.2 kg)  09/29/20 143 lb (64.9 kg)  09/27/20 140 lb (63.5 kg)    Physical Exam Vitals and nursing note reviewed.  Constitutional:      General: She is awake. She is not in acute distress.    Appearance: She is well-developed, well-groomed and overweight. She is not ill-appearing.  HENT:     Head: Normocephalic.     Right Ear: Hearing normal.     Left Ear: Hearing normal.  Eyes:     General: Lids are normal.        Right eye: No discharge.        Left eye: No discharge.     Conjunctiva/sclera: Conjunctivae normal.     Pupils: Pupils are equal, round, and reactive to light.  Neck:     Thyroid: No thyromegaly.     Vascular: No carotid bruit.  Cardiovascular:     Rate and Rhythm: Normal rate and regular rhythm.     Heart sounds: Normal heart sounds. No murmur heard.   No gallop.  Pulmonary:     Effort: Pulmonary effort is normal. No accessory muscle usage or respiratory distress.     Breath sounds: Normal breath sounds.  Abdominal:     General: Bowel sounds are normal.     Palpations: Abdomen is soft.     Tenderness: There is no right CVA tenderness, left CVA tenderness, guarding or rebound.     Hernia: No hernia is present.  Musculoskeletal:     Cervical back: Normal range of motion and neck supple.     Right lower leg: No edema.     Left lower leg: No  edema.  Skin:    General: Skin is warm and dry.  Neurological:     Mental Status: She is alert and oriented to person, place, and time.  Psychiatric:        Attention and Perception: Attention normal.        Mood and Affect: Mood normal.        Behavior: Behavior normal. Behavior is cooperative.        Thought Content: Thought content normal.        Judgment: Judgment normal.   Results for orders placed or performed in visit on 09/29/20  WET [place], YEAST,  CLUE   Specimen: Sterile Swab   Sterile Swab  Result Value Ref Range   Trichomonas Exam Negative Negative   Yeast Exam Negative Negative   Clue Cell Exam Positive (A) Negative  Microscopic Examination   Urine  Result Value Ref Range   WBC, UA 0-5 0 - 5 /hpf   RBC 0-2 0 - 2 /hpf   Epithelial Cells (non renal) 0-10 0 - 10 /hpf   Bacteria, UA Few (A) None seen/Few  Urinalysis, Routine w reflex microscopic  Result Value Ref Range   Specific Gravity, UA 1.010 1.005 - 1.030   pH, UA 5.5 5.0 - 7.5   Color, UA Yellow Yellow   Appearance Ur Clear Clear   Leukocytes,UA Negative Negative   Protein,UA Negative Negative/Trace   Glucose, UA Negative Negative   Ketones, UA Negative Negative   RBC, UA Trace (A) Negative   Bilirubin, UA Negative Negative   Urobilinogen, Ur 0.2 0.2 - 1.0 mg/dL   Nitrite, UA Negative Negative   Microscopic Examination See below:   Basic metabolic panel  Result Value Ref Range   Glucose 65 65 - 99 mg/dL   BUN 12 6 - 24 mg/dL   Creatinine, Ser 0.79 0.57 - 1.00 mg/dL   GFR calc non Af Amer 82 >59 mL/min/1.73   GFR calc Af Amer 95 >59 mL/min/1.73   BUN/Creatinine Ratio 15 9 - 23   Sodium 137 134 - 144 mmol/L   Potassium 4.1 3.5 - 5.2 mmol/L   Chloride 101 96 - 106 mmol/L   CO2 24 20 - 29 mmol/L   Calcium 9.2 8.7 - 10.2 mg/dL  Lipid Panel w/o Chol/HDL Ratio  Result Value Ref Range   Cholesterol, Total 129 100 - 199 mg/dL   Triglycerides 54 0 - 149 mg/dL   HDL 52 >39 mg/dL   VLDL  Cholesterol Cal 12 5 - 40 mg/dL   LDL Chol Calc (NIH) 65 0 - 99 mg/dL      Assessment & Plan:   Problem List Items Addressed This Visit       Cardiovascular and Mediastinum   Aortic atherosclerosis (HCC)    Chronic, ongoing, noted on October 2019 lung screening CT.  Continue statin and ASA daily + recommend complete cessation of smoking.       Coronary artery disease, non-occlusive    Chronic, stable.  Continue current medication regimen and collaboration with cardiology.         Respiratory   Centrilobular emphysema (HCC) - Primary    Chronic, ongoing.  No current inhaler regimen.  Denies any symptoms.  Continue yearly lung screening and plan on spirometry next visit to assess function.  Recommend complete cessation of smoking.  CBC and TSH today.       Relevant Orders   CBC with Differential/Platelet     Other   Nicotine dependence, cigarettes, uncomplicated    I have recommended complete cessation of tobacco use. I have discussed various options available for assistance with tobacco cessation including over the counter methods (Nicotine gum, patch and lozenges). We also discussed prescription options (Chantix, Nicotine Inhaler / Nasal Spray). The patient is not interested in pursuing any prescription tobacco cessation options at this time.        Depression, recurrent (HCC)    Chronic, stable.  Continue current medication regimen and adjust as needed.  Denies SI/HI.  Consider change from Prozac to Zoloft in future, which would offer benefit with anxiety and depression.  At this time she  wishes not to change regimen.       Relevant Orders   TSH   Hyperlipidemia    Chronic, ongoing.  Continue current medication regimen and collaboration with cardiology.  Lipid panel today and CMP.       Relevant Orders   Comprehensive metabolic panel   Lipid Panel w/o Chol/HDL Ratio   Anxiety    Chronic, ongoing.  Continue Klonopin at this time, patient educated on risks of  medication and is not interested in reducing -- recent refills sent in.  Continue Prozac 40 MG which benefits anxiety and fibromyalgia, discussed with her changing to Zoloft or Duloxetine if increase anxiety/depression noted.  At this time she denies SI/HI and wishes to maintain current regimen.  UDS yearly, contract up to date, next UDS October 2022.  Return in 3 months.       Relevant Medications   clonazePAM (KLONOPIN) 1 MG tablet (Start on 02/18/2021)   Benzodiazepine dependence, continuous ([place])    Refer to anxiety plan for updates and discussion.         Follow up plan: Return in about 3 months (around 05/19/2021) for Anxiety with med refills and COPD -- spirometry needed.

## 2021-02-16 NOTE — Assessment & Plan Note (Signed)
Chronic, stable.  Continue current medication regimen and collaboration with cardiology. 

## 2021-02-16 NOTE — Assessment & Plan Note (Signed)
I have recommended complete cessation of tobacco use. I have discussed various options available for assistance with tobacco cessation including over the counter methods (Nicotine gum, patch and lozenges). We also discussed prescription options (Chantix, Nicotine Inhaler / Nasal Spray). The patient is not interested in pursuing any prescription tobacco cessation options at this time.  

## 2021-02-16 NOTE — Assessment & Plan Note (Signed)
Chronic, ongoing.  Continue current medication regimen and collaboration with cardiology.  Lipid panel today and CMP. 

## 2021-02-16 NOTE — Assessment & Plan Note (Signed)
Chronic, ongoing.  Continue Klonopin at this time, patient educated on risks of medication and is not interested in reducing -- recent refills sent in.  Continue Prozac 40 MG which benefits anxiety and fibromyalgia, discussed with her changing to Zoloft or Duloxetine if increase anxiety/depression noted.  At this time she denies SI/HI and wishes to maintain current regimen.  UDS yearly, contract up to date, next UDS October 2022.  Return in 3 months.

## 2021-02-16 NOTE — Patient Instructions (Signed)

## 2021-02-17 LAB — CBC WITH DIFFERENTIAL/PLATELET
Basophils Absolute: 0.1 10*3/uL (ref 0.0–0.2)
Basos: 1 %
EOS (ABSOLUTE): 0.1 10*3/uL (ref 0.0–0.4)
Eos: 1 %
Hematocrit: 41.2 % (ref 34.0–46.6)
Hemoglobin: 13.4 g/dL (ref 11.1–15.9)
Immature Grans (Abs): 0 10*3/uL (ref 0.0–0.1)
Immature Granulocytes: 0 %
Lymphocytes Absolute: 2.5 10*3/uL (ref 0.7–3.1)
Lymphs: 46 %
MCH: 30.2 pg (ref 26.6–33.0)
MCHC: 32.5 g/dL (ref 31.5–35.7)
MCV: 93 fL (ref 79–97)
Monocytes Absolute: 0.5 10*3/uL (ref 0.1–0.9)
Monocytes: 9 %
Neutrophils Absolute: 2.3 10*3/uL (ref 1.4–7.0)
Neutrophils: 43 %
Platelets: 231 10*3/uL (ref 150–450)
RBC: 4.44 x10E6/uL (ref 3.77–5.28)
RDW: 12.2 % (ref 11.7–15.4)
WBC: 5.4 10*3/uL (ref 3.4–10.8)

## 2021-02-17 LAB — COMPREHENSIVE METABOLIC PANEL
ALT: 14 IU/L (ref 0–32)
AST: 20 IU/L (ref 0–40)
Albumin/Globulin Ratio: 1.9 (ref 1.2–2.2)
Albumin: 4.3 g/dL (ref 3.8–4.9)
Alkaline Phosphatase: 85 IU/L (ref 44–121)
BUN/Creatinine Ratio: 14 (ref 9–23)
BUN: 11 mg/dL (ref 6–24)
Bilirubin Total: 0.5 mg/dL (ref 0.0–1.2)
CO2: 25 mmol/L (ref 20–29)
Calcium: 9.3 mg/dL (ref 8.7–10.2)
Chloride: 101 mmol/L (ref 96–106)
Creatinine, Ser: 0.76 mg/dL (ref 0.57–1.00)
Globulin, Total: 2.3 g/dL (ref 1.5–4.5)
Glucose: 72 mg/dL (ref 65–99)
Potassium: 4.1 mmol/L (ref 3.5–5.2)
Sodium: 139 mmol/L (ref 134–144)
Total Protein: 6.6 g/dL (ref 6.0–8.5)
eGFR: 90 mL/min/{1.73_m2} (ref >59)

## 2021-02-17 LAB — TSH: TSH: 1.76 u[IU]/mL (ref 0.450–4.500)

## 2021-02-17 LAB — LIPID PANEL W/O CHOL/HDL RATIO
Cholesterol, Total: 142 mg/dL (ref 100–199)
HDL: 53 mg/dL (ref >39)
LDL Chol Calc (NIH): 77 mg/dL (ref 0–99)
Triglycerides: 59 mg/dL (ref 0–149)
VLDL Cholesterol Cal: 12 mg/dL (ref 5–40)

## 2021-02-17 NOTE — Progress Notes (Signed)
Good morning, please let Lindsey Bautista know all of her labs have returned and everything looks great.  Continue all current medications and keep on being stellar!! Keep being awesome!!  Thank you for allowing me to participate in your care.  I appreciate you. Kindest regards, Margo Lama

## 2021-04-13 ENCOUNTER — Other Ambulatory Visit: Payer: Self-pay | Admitting: Family Medicine

## 2021-04-13 ENCOUNTER — Other Ambulatory Visit: Payer: Self-pay | Admitting: Nurse Practitioner

## 2021-04-22 ENCOUNTER — Other Ambulatory Visit: Payer: Self-pay | Admitting: *Deleted

## 2021-04-22 DIAGNOSIS — Z87891 Personal history of nicotine dependence: Secondary | ICD-10-CM

## 2021-04-22 DIAGNOSIS — F17210 Nicotine dependence, cigarettes, uncomplicated: Secondary | ICD-10-CM

## 2021-04-26 ENCOUNTER — Other Ambulatory Visit: Payer: Self-pay

## 2021-04-26 ENCOUNTER — Telehealth: Payer: Self-pay

## 2021-04-26 DIAGNOSIS — N644 Mastodynia: Secondary | ICD-10-CM

## 2021-04-26 DIAGNOSIS — Z1231 Encounter for screening mammogram for malignant neoplasm of breast: Secondary | ICD-10-CM

## 2021-04-26 NOTE — Telephone Encounter (Signed)
Copied from [place] [phone]. Topic: Referral - Request for Referral >> Apr 26, 2021  9:35 AM Yvette Rack wrote: Has patient seen PCP for this complaint? Yes.   *If NO, is insurance requiring patient see PCP for this issue before PCP can refer them? Referral for which specialty: mammogram for both breast Preferred provider/office: [facility] Reason for referral: overdue for mammogram on both breast   Order placed. Called and notified patient.

## 2021-04-26 NOTE — Telephone Encounter (Signed)
Called to get more information from the patient. Patient states that she is having bilateral breast tenderness. Diagnostic mammogram and ultrasound orders placed for patient.

## 2021-04-26 NOTE — Telephone Encounter (Signed)
Pt is calling back to request if the referral can be changed to a diagnotic imaging for both breast. Please advise CB- [phone]

## 2021-05-02 NOTE — Progress Notes (Signed)
Cardiology Office Note  Date:  05/03/2021   ID:  Lindsey Bautista, DOB 09-12-1960, MRN [id number]  PCP:  Venita Lick, NP   Chief Complaint  Patient presents with   12 month follow up     Patient c/o bilateral leg pain daily and heaviness in arms. Medications reviewed by the patient verbally.     HPI:  Lindsey Bautista is a 60 year old woman with history of  Medication noncompliance anxiety/depression,  Mild carotid disease long smoking history for close to 40 years, who continues to smoke stroke type symptoms in April 2015 where she could not talk for 4 days, again in April 2017 diagnosed with conversion disorder again in March 2018 CAD Hyperlipidemia Chronic chest pain, dyspnea on exertion 2019 diagnostic cath which showed 50% mid LAD stenosis with FFR being normal.  moderate diffuse PAD extending into the common iliac arteries  who presents for prior episode of syncope, chest pain.  Legs tender to palpation Arms sore, heavy, numb hands H/a at night Sleeps alot  Sedentary  LE arterial doppler: 10/2019 Normal ABIs  Stress test 06/2020: normal myoview  1/2 ppd smoking No chest pain  Stress test 2020 , 2022 with no ischemia, no ejection fraction Stress test 2013, 2015, 2016, 2018  Concerned about carotids, last evaluated 2 years ago,  mild disease  EKG personally reviewed by myself on todays visit Shows sinus bradycardia rate 61 bpm no significant ST-T wave changes  Other past medical history reviewed hospital in 05/2018 with chest pain and ruled out.  diagnostic cath which showed 50% mid LAD stenosis with FFR being normal.  Echo  normal LV systolic function.   Headaches on Imdur  Stroke type symptoms April 2017 diagnosed with conversion disorder Stress test January 2018 for chest pain with no ischemia  Had  stroke type symptomsMarch 2018 Right-sided weakness, migraine She had chest pain, multiple problems, right-sided weakness, headache Carotid u/s mild  disease MRI was negative. Started on for verapamil for migraine.   Previously having "passed out spells" sometimes when she is sitting, sometimes when she is standing.  Previous history of excess somnolence, random periods of weakness She is never injured herself when she "passes out" even despite having events when she is standing up.   CT scan of the abdomen from December 2016  showing moderate diffuse PAD extending into the common iliac arteries    PMH:   has a past medical history of Atypical chest pain, COPD (chronic obstructive pulmonary disease) ([place]), Coronary artery disease, non-occlusive, Depression, Diastolic dysfunction, GERD (gastroesophageal reflux disease), Hypotension, IBS (irritable bowel syndrome), Right arm weakness, Situational syncope, Stroke ([place]), and TIA (transient ischemic attack).  PSH:    Past Surgical History:  Procedure Laterality Date   COLONOSCOPY WITH PROPOFOL N/A 11/22/2015   Procedure: COLONOSCOPY WITH PROPOFOL;  Surgeon: Manya Silvas, MD;  Location[facility] ENDOSCOPY;  Service: Endoscopy;  Laterality: N/A;   ESOPHAGOGASTRODUODENOSCOPY (EGD) WITH PROPOFOL N/A 11/22/2015   Procedure: ESOPHAGOGASTRODUODENOSCOPY (EGD) WITH PROPOFOL;  Surgeon: Manya Silvas, MD;  Location[facility] ENDOSCOPY;  Service: Endoscopy;  Laterality: N/A;   INTRAVASCULAR PRESSURE WIRE/FFR STUDY N/A 06/19/2018   Procedure: INTRAVASCULAR PRESSURE WIRE/FFR STUDY;  Surgeon: Nelva Bush, MD;  Location: [place] CV LAB;  Service: Cardiovascular;  Laterality: N/A;   LEFT HEART CATH AND CORONARY ANGIOGRAPHY N/A 06/19/2018   Procedure: LEFT HEART CATH AND CORONARY ANGIOGRAPHY;  Surgeon: Nelva Bush, MD;  Location: [place] CV LAB;  Service: Cardiovascular;  Laterality: N/A;   OOPHORECTOMY  TONSILLECTOMY     VAGINAL HYSTERECTOMY      Current Outpatient Medications  Medication Sig Dispense Refill   aspirin 81 MG tablet Take 81 mg by mouth daily.     atorvastatin  (LIPITOR) 80 MG tablet Take 1 tablet (80 mg total) by mouth daily. 90 tablet 4   clonazePAM (KLONOPIN) 1 MG tablet Take 1 tablet (1 mg total) by mouth 2 (two) times daily. 60 tablet 2   docusate sodium (COLACE) 100 MG capsule Take 100 mg by mouth daily.      EPINEPHrine 0.3 mg/0.3 mL IJ SOAJ injection Inject 0.3 mg into the muscle as needed for anaphylaxis. 1 each 0   estradiol (ESTRACE) 0.5 MG tablet Take 1 tablet (0.5 mg total) by mouth daily. 90 tablet 4   ezetimibe (ZETIA) 10 MG tablet Take 1 tablet (10 mg total) by mouth daily. 90 tablet 4   FLUoxetine (PROZAC) 40 MG capsule Take 1 capsule (40 mg total) by mouth daily. 90 capsule 4   linaclotide (LINZESS) 72 MCG capsule TAKE 1 CAPSULE BY MOUTH ONCE DAILY BEFORE BREAKFAST 90 capsule 4   nitroGLYCERIN (NITROSTAT) 0.4 MG SL tablet Place 1 tablet (0.4 mg total) under the tongue every 5 (five) minutes as needed for chest pain. 50 tablet 3   No current facility-administered medications for this visit.     Allergies:   Flagyl [metronidazole], Penicillin g, Iodinated diagnostic agents, and Flomax [tamsulosin hcl]   Social History:  The patient  reports that she has been smoking cigarettes. She has a 19.50 pack-year smoking history. She has never used smokeless tobacco. She reports that she does not drink alcohol and does not use drugs.   Family History:   family history includes Bladder Cancer in her mother; Breast cancer (age of onset: 71) in her paternal aunt; COPD in her father and another family member; Colon cancer in her brother; Heart disease in an other family member; Heart failure in her mother; Renal Disease in her mother; Stroke in an other family member.    Review of Systems: Review of Systems  Constitutional:  Positive for malaise/fatigue.  HENT: Negative.    Respiratory: Negative.    Cardiovascular: Negative.   Gastrointestinal: Negative.   Musculoskeletal: Negative.   Neurological: Negative.   Psychiatric/Behavioral:   Positive for depression. The patient is nervous/anxious.   All other systems reviewed and are negative.  PHYSICAL EXAM: VS:  BP (!) 138/50 (BP Location: Left Arm, Patient Position: Sitting, Cuff Size: Normal)   Pulse 61   Ht '5\' 1"'$  (1.549 m)   Wt 144 lb 2 oz (65.4 kg)   SpO2 98%   BMI 27.23 kg/m  , BMI Body mass index is 27.23 kg/m. Constitutional:  oriented to person, place, and time. No distress.  HENT:  Head: Grossly normal Eyes:  no discharge. No scleral icterus.  Neck: No JVD, no carotid bruits  Cardiovascular: Regular rate and rhythm, no murmurs appreciated Pulmonary/Chest: Clear to auscultation bilaterally, no wheezes or rails Abdominal: Soft.  no distension.  no tenderness.  Musculoskeletal: Normal range of motion Neurological:  normal muscle tone. Coordination normal. No atrophy Skin: Skin warm and dry Psychiatric: normal affect, pleasant   Recent Labs: 02/16/2021: ALT 14; BUN 11; Creatinine, Ser 0.76; Hemoglobin 13.4; Platelets 231; Potassium 4.1; Sodium 139; TSH 1.760    Lipid Panel Lab Results  Component Value Date   CHOL 142 02/16/2021   HDL 53 02/16/2021   LDLCALC 77 02/16/2021   TRIG 59 02/16/2021  Wt Readings from Last 3 Encounters:  05/03/21 144 lb 2 oz (65.4 kg)  02/16/21 143 lb 12.8 oz (65.2 kg)  09/29/20 143 lb (64.9 kg)       ASSESSMENT AND PLAN:  PAD (peripheral artery disease) (HCC) Moderate disease seen on CT scan Cholesterol at goal, no medication changes made No further testing needed Pain in legs is atypical U/s last year, good ABIs  Situational syncope No episodes of syncope No near syncope  Abdominal aortic atherosclerosis (HCC) Smoking cessation, aggressive lipid management Continue Lipitor Zetia  Mixed hyperlipidemia - Plan: EKG 12-Lead lipitor and zetia Numbers at goal  Smoker - Plan: EKG 12-Lead We have encouraged her to continue to work on weaning her cigarettes and smoking cessation. She will continue to work on  this and does not want any assistance with chantix.    Centrilobular emphysema (HCC) Continues to smoke, chronic shortness of breath on exertion Smoking cessation recommended  sedentary  Chest pressure  Numerous stress test dating back to 2013 one a year or every other year No further testing at this time, last was done 2020 Atypical, Discussed anginal sx to watch for  Depression/anxiety Managed by primary care Anxiety concerning underlying health issues Sleeping lots of the time   Total encounter time more than 25 minutes  Greater than 50% was spent in counseling and coordination of care with the patient   No orders of the defined types were placed in this encounter.    Signed, Esmond Plants, M.D., Ph.D. 05/03/2021  [place], [place]

## 2021-05-03 ENCOUNTER — Other Ambulatory Visit: Payer: Self-pay

## 2021-05-03 ENCOUNTER — Ambulatory Visit
Admission: RE | Admit: 2021-05-03 | Discharge: 2021-05-03 | Disposition: A | Discharge status: Home / Self Care | Payer: Medicare Other | Source: Ambulatory Visit | Attending: Nurse Practitioner | Admitting: Nurse Practitioner

## 2021-05-03 ENCOUNTER — Encounter: Payer: Self-pay | Admitting: Cardiovascular Disease

## 2021-05-03 ENCOUNTER — Ambulatory Visit (INDEPENDENT_AMBULATORY_CARE_PROVIDER_SITE_OTHER): Payer: Medicare Other | Admitting: Cardiovascular Disease

## 2021-05-03 VITALS — BP 138/50 | HR 61 | Ht 61.0 in | Wt 144.1 lb

## 2021-05-03 DIAGNOSIS — I2510 Atherosclerotic heart disease of native coronary artery without angina pectoris: Secondary | ICD-10-CM

## 2021-05-03 DIAGNOSIS — R0602 Shortness of breath: Secondary | ICD-10-CM | POA: Diagnosis not present

## 2021-05-03 DIAGNOSIS — Z720 Tobacco use: Secondary | ICD-10-CM

## 2021-05-03 DIAGNOSIS — N644 Mastodynia: Secondary | ICD-10-CM

## 2021-05-03 DIAGNOSIS — I700 Atherosclerosis of aorta: Secondary | ICD-10-CM

## 2021-05-03 DIAGNOSIS — E785 Hyperlipidemia, unspecified: Secondary | ICD-10-CM

## 2021-05-03 DIAGNOSIS — I6521 Occlusion and stenosis of right carotid artery: Secondary | ICD-10-CM | POA: Diagnosis not present

## 2021-05-03 DIAGNOSIS — I739 Peripheral vascular disease, unspecified: Secondary | ICD-10-CM

## 2021-05-03 DIAGNOSIS — R922 Inconclusive mammogram: Secondary | ICD-10-CM | POA: Diagnosis not present

## 2021-05-03 MED ORDER — ATORVASTATIN CALCIUM 80 MG PO TABS
80.0000 mg | ORAL_TABLET | Freq: Every day | ORAL | 3 refills | Status: DC
Start: 2021-05-03 — End: 2022-01-09

## 2021-05-03 MED ORDER — NITROGLYCERIN 0.4 MG SL SUBL: 0.4000 mg | SUBLINGUAL_TABLET | SUBLINGUAL | 3 refills | Status: DC | PRN

## 2021-05-03 MED ORDER — EZETIMIBE 10 MG PO TABS
10.0000 mg | ORAL_TABLET | Freq: Every day | ORAL | 3 refills | Status: DC
Start: 2021-05-03 — End: 2022-01-09

## 2021-05-03 NOTE — Addendum Note (Signed)
Addended by: Wynema Birch on: 05/03/2021 02:34 PM   Modules accepted: Orders

## 2021-05-03 NOTE — Progress Notes (Signed)
Please let Lindsey Bautista know her mammogram returned normal, may repeat in one year.:)

## 2021-05-03 NOTE — Patient Instructions (Addendum)
Medication Instructions:  No changes  If you need a refill on your cardiac medications before your next appointment, please call your pharmacy.   Lab work: No new labs needed  Testing/Procedures: No new testing needed  Follow-Up: At [facility], you and your health needs are our priority.  As part of our continuing mission to provide you with exceptional heart care, we have created designated Provider Care Teams.  These Care Teams include your primary Cardiologist (physician) and Advanced Practice Providers (APPs -  Physician Assistants and Nurse Practitioners) who all work together to provide you with the care you need, when you need it.  You will need a follow up appointment in 12 months  Providers on your designated Care Team:   Christopher Berge, NP Ryan Dunn, PA-C Jacquelyn Visser, PA-C Cadence Furth, PA-C  COVID-19 Vaccine Information can be found at: [email or web address] For questions related to vaccine distribution or appointments, please email [email or web address] or call [phone].    

## 2021-05-18 ENCOUNTER — Encounter: Payer: Self-pay | Admitting: Nurse Practitioner

## 2021-05-18 ENCOUNTER — Ambulatory Visit (INDEPENDENT_AMBULATORY_CARE_PROVIDER_SITE_OTHER): Payer: Medicare Other | Admitting: Nurse Practitioner

## 2021-05-18 ENCOUNTER — Other Ambulatory Visit: Payer: Self-pay

## 2021-05-18 VITALS — BP 126/67 | HR 64 | Temp 98.9°F | Wt 145.0 lb

## 2021-05-18 DIAGNOSIS — F17210 Nicotine dependence, cigarettes, uncomplicated: Secondary | ICD-10-CM

## 2021-05-18 DIAGNOSIS — F419 Anxiety disorder, unspecified: Secondary | ICD-10-CM | POA: Diagnosis not present

## 2021-05-18 DIAGNOSIS — F339 Major depressive disorder, recurrent, unspecified: Secondary | ICD-10-CM | POA: Diagnosis not present

## 2021-05-18 DIAGNOSIS — K2270 Barrett's esophagus without dysplasia: Secondary | ICD-10-CM

## 2021-05-18 DIAGNOSIS — I6521 Occlusion and stenosis of right carotid artery: Secondary | ICD-10-CM

## 2021-05-18 DIAGNOSIS — R399 Unspecified symptoms and signs involving the genitourinary system: Secondary | ICD-10-CM | POA: Diagnosis not present

## 2021-05-18 DIAGNOSIS — F1320 Sedative, hypnotic or anxiolytic dependence, uncomplicated: Secondary | ICD-10-CM | POA: Diagnosis not present

## 2021-05-18 LAB — WET PREP FOR TRICH, YEAST, CLUE
Clue Cell Exam: NEGATIVE
Trichomonas Exam: NEGATIVE
Yeast Exam: NEGATIVE

## 2021-05-18 LAB — URINALYSIS, ROUTINE W REFLEX MICROSCOPIC
Bilirubin, UA: NEGATIVE
Glucose, UA: NEGATIVE
Ketones, UA: NEGATIVE
Leukocytes,UA: NEGATIVE
Nitrite, UA: NEGATIVE
Protein,UA: NEGATIVE
RBC, UA: NEGATIVE
Specific Gravity, UA: 1.02 (ref 1.005–1.030)
Urobilinogen, Ur: 0.2 mg/dL (ref 0.2–1.0)
pH, UA: 7 (ref 5.0–7.5)

## 2021-05-18 MED ORDER — CLONAZEPAM 1 MG PO TABS: 1.0000 mg | ORAL_TABLET | Freq: Two times a day (BID) | ORAL | 2 refills | Status: DC

## 2021-05-18 NOTE — Patient Instructions (Signed)

## 2021-05-18 NOTE — Progress Notes (Addendum)
BP 126/67   Pulse 64   Temp 98.9 F (37.2 C) (Oral)   Wt 145 lb (65.8 kg)   SpO2 97%   BMI 27.40 kg/m    Subjective:    Patient ID: Lindsey Bautista, female    DOB: 1961-01-03, 60 y.o.   MRN: [id number]  HPI: Lindsey Bautista is a 60 y.o. female  Chief Complaint  Patient presents with   Anxiety    Patient states she is hanging in there as far as her anxiety goes.    Medication Refill    Patient states she is requesting a refill on her Clonazepam prescription.    Needs referral back to GI, Dr. Alice Reichert, for follow-up Barrett's esophagus -- has been having more hoarseness recently -- decided to see GI first vs Dr. Farrel Conners with ENT.  Continues to smoke cigarettes daily.  Would like urine and wet prep today, due to past issues with urinary symptoms, is worried about this recurrence.  Has no symptoms today.  DEPRESSION/ANXIETY Continues on Prozac 40 MG daily and Klonopin 1 MG BID.  Pt is aware of risks of benzo medication use to include increased sedation, respiratory suppression, falls, dependence and cardiovascular events.  Pt would like to continue treatment as benefit determined to outweigh risk.  On PDMP review last Klonopin fill 04/15/21 for #60.  No other controlled substances noted.  Overall mood well-controlled with this.   Mood status: stable Satisfied with current treatment?: yes Symptom severity: mild  Duration of current treatment : chronic Side effects: no Medication compliance: good compliance Psychotherapy/counseling: none Previous psychiatric medications: Prozac and Klonopin Depressed mood: no Anxious mood: yes Anhedonia: no Significant weight loss or gain: no Insomnia: yes hard to fall asleep Fatigue: at times Feelings of worthlessness or guilt: none Impaired concentration/indecisiveness: yes Suicidal ideations: no Hopelessness: no Crying spells: no Depression screen [facility] 2/9 05/18/2021 02/16/2021 12/27/2020 09/29/2020 09/27/2020  Decreased Interest 1 0 0 0 3  Down,  Depressed, Hopeless 0 0 1 1 0  PHQ - 2 Score 1 0 _0 Altered sleeping 0 _1 Tired, decreased energy 3 0 0 1 3  Change in appetite 0 0 0 1 0  Feeling bad or failure about yourself  1 0 0 0 0  Trouble concentrating 0 0 0 0 3  Moving slowly or fidgety/restless 0 0 0 0 0  Suicidal thoughts 0 0 0 0 0  PHQ-9 Score _2 Difficult doing work/chores Not difficult at all Not difficult at all Not difficult at all - Somewhat difficult  Some recent data might be hidden   GAD 7 : Generalized Anxiety Score 05/18/2021 02/16/2021 12/27/2020 09/29/2020  Nervous, Anxious, on Edge _3 Control/stop worrying _4 Worry too much - different things _5 Trouble relaxing _6 Restless _7 Easily annoyed or irritable _8 Afraid - awful might happen _9 Total GAD 7 Score _10 Anxiety Difficulty Somewhat difficult Somewhat difficult Somewhat difficult Somewhat difficult   Relevant past medical, surgical, family and social history reviewed and updated as indicated. Interim medical history since our last visit reviewed. Allergies and medications reviewed and updated.  Review of Systems  Constitutional:  Negative for activity change, appetite change, diaphoresis, fatigue and fever.  Respiratory:  Negative for cough, chest tightness, shortness of  breath and wheezing.   Cardiovascular:  Negative for chest pain, palpitations and leg swelling.  Gastrointestinal: Negative.   Endocrine: Negative for cold intolerance and heat intolerance.  Neurological: Negative.   Psychiatric/Behavioral:  Negative for decreased concentration, self-injury, sleep disturbance and suicidal ideas. The patient is nervous/anxious.    Per HPI unless specifically indicated above     Objective:    BP 126/67   Pulse 64   Temp 98.9 F (37.2 C) (Oral)   Wt 145 lb (65.8 kg)   SpO2 97%   BMI 27.40 kg/m   Wt Readings from Last 3 Encounters:  05/18/21 145 lb (65.8 kg)  05/03/21 144 lb 2 oz  (65.4 kg)  02/16/21 143 lb 12.8 oz (65.2 kg)    Physical Exam Vitals and nursing note reviewed.  Constitutional:      General: She is awake. She is not in acute distress.    Appearance: She is well-developed, well-groomed and overweight. She is not ill-appearing.  HENT:     Head: Normocephalic.     Right Ear: Hearing normal.     Left Ear: Hearing normal.     Mouth/Throat:     Comments: Hoarseness present. Eyes:     General: Lids are normal.        Right eye: No discharge.        Left eye: No discharge.     Conjunctiva/sclera: Conjunctivae normal.     Pupils: Pupils are equal, round, and reactive to light.  Neck:     Thyroid: No thyromegaly.     Vascular: No carotid bruit.  Cardiovascular:     Rate and Rhythm: Normal rate and regular rhythm.     Heart sounds: Normal heart sounds. No murmur heard.   No gallop.  Pulmonary:     Effort: Pulmonary effort is normal. No accessory muscle usage or respiratory distress.     Breath sounds: Normal breath sounds.  Abdominal:     General: Bowel sounds are normal.     Palpations: Abdomen is soft.     Tenderness: There is no right CVA tenderness, left CVA tenderness, guarding or rebound.     Hernia: No hernia is present.  Musculoskeletal:     Cervical back: Normal range of motion and neck supple.     Right lower leg: No edema.     Left lower leg: No edema.  Skin:    General: Skin is warm and dry.  Neurological:     Mental Status: She is alert and oriented to person, place, and time.  Psychiatric:        Attention and Perception: Attention normal.        Mood and Affect: Mood normal.        Behavior: Behavior normal. Behavior is cooperative.        Thought Content: Thought content normal.        Judgment: Judgment normal.   Results for orders placed or performed in visit on 02/16/21  Comprehensive metabolic panel  Result Value Ref Range   Glucose 72 65 - 99 mg/dL   BUN 11 6 - 24 mg/dL   Creatinine, Ser 0.76 0.57 - 1.00 mg/dL    eGFR 90 >59 mL/min/1.73   BUN/Creatinine Ratio 14 9 - 23   Sodium 139 134 - 144 mmol/L   Potassium 4.1 3.5 - 5.2 mmol/L   Chloride 101 96 - 106 mmol/L   CO2 25 20 - 29 mmol/L   Calcium 9.3 8.7 - 10.2 mg/dL     Total Protein 6.6 6.0 - 8.5 g/dL   Albumin 4.3 3.8 - 4.9 g/dL   Globulin, Total 2.3 1.5 - 4.5 g/dL   Albumin/Globulin Ratio 1.9 1.2 - 2.2   Bilirubin Total 0.5 0.0 - 1.2 mg/dL   Alkaline Phosphatase 85 44 - 121 IU/L   AST 20 0 - 40 IU/L   ALT 14 0 - 32 IU/L  CBC with Differential/Platelet  Result Value Ref Range   WBC 5.4 3.4 - 10.8 x10E3/uL   RBC 4.44 3.77 - 5.28 x10E6/uL   Hemoglobin 13.4 11.1 - 15.9 g/dL   Hematocrit 41.2 34.0 - 46.6 %   MCV 93 79 - 97 fL   MCH 30.2 26.6 - 33.0 pg   MCHC 32.5 31.5 - 35.7 g/dL   RDW 12.2 11.7 - 15.4 %   Platelets 231 150 - 450 x10E3/uL   Neutrophils 43 Not Estab. %   Lymphs 46 Not Estab. %   Monocytes 9 Not Estab. %   Eos 1 Not Estab. %   Basos 1 Not Estab. %   Neutrophils Absolute 2.3 1.4 - 7.0 x10E3/uL   Lymphocytes Absolute 2.5 0.7 - 3.1 x10E3/uL   Monocytes Absolute 0.5 0.1 - 0.9 x10E3/uL   EOS (ABSOLUTE) 0.1 0.0 - 0.4 x10E3/uL   Basophils Absolute 0.1 0.0 - 0.2 x10E3/uL   Immature Granulocytes 0 Not Estab. %   Immature Grans (Abs) 0.0 0.0 - 0.1 x10E3/uL  Lipid Panel w/o Chol/HDL Ratio  Result Value Ref Range   Cholesterol, Total 142 100 - 199 mg/dL   Triglycerides 59 0 - 149 mg/dL   HDL 53 >39 mg/dL   VLDL Cholesterol Cal 12 5 - 40 mg/dL   LDL Chol Calc (NIH) 77 0 - 99 mg/dL  TSH  Result Value Ref Range   TSH 1.760 0.450 - 4.500 uIU/mL      Assessment & Plan:   Problem List Items Addressed This Visit       Digestive   Barrett's esophagus    Referral back to GI for further assessment due to increase symptoms.      Relevant Orders   Ambulatory referral to Gastroenterology     Other   Nicotine dependence, cigarettes, uncomplicated    I have recommended complete cessation of tobacco use. I have discussed various  options available for assistance with tobacco cessation including over the counter methods (Nicotine gum, patch and lozenges). We also discussed prescription options (Chantix, Nicotine Inhaler / Nasal Spray). The patient is not interested in pursuing any prescription tobacco cessation options at this time.       Depression, recurrent ([place]) - Primary    Chronic, stable.  Continue current medication regimen and adjust as needed.  Denies SI/HI.  Consider change from Prozac to Zoloft in future, which would offer benefit with anxiety and depression.  At this time she wishes not to change regimen.      Anxiety    Chronic, ongoing.  Continue Klonopin at this time, patient educated on risks of medication and is not interested in reducing -- refills sent in.  Continue Prozac 40 MG which benefits anxiety and fibromyalgia, discussed with her changing to Zoloft or Duloxetine if increase anxiety/depression noted.  At this time she denies SI/HI and wishes to maintain current regimen.  UDS yearly, contract up to date, next UDS today.  Return in 3 months.      Relevant Medications   clonazePAM (KLONOPIN) 1 MG tablet   Benzodiazepine dependence, continuous ([place])  Refer to anxiety plan for updates and discussion.      Other Visit Diagnoses     Urinary symptom or sign       UA and wet prep negative today -- no treatment needed, discussed with patient.   Relevant Orders   WET PREP FOR TRICH, YEAST, CLUE   Urinalysis, Routine w reflex microscopic        Follow up plan: Return in about 3 months (around 08/17/2021) for Annual physical. 

## 2021-05-18 NOTE — Assessment & Plan Note (Signed)
Refer to anxiety plan for updates and discussion.

## 2021-05-18 NOTE — Assessment & Plan Note (Signed)
Chronic, stable.  Continue current medication regimen and adjust as needed.  Denies SI/HI.  Consider change from Prozac to Zoloft in future, which would offer benefit with anxiety and depression.  At this time she wishes not to change regimen.

## 2021-05-18 NOTE — Addendum Note (Signed)
Addended by: Marnee Guarneri T on: 05/18/2021 02:36 PM   Modules accepted: Orders

## 2021-05-18 NOTE — Assessment & Plan Note (Signed)
Referral back to GI for further assessment due to increase symptoms.

## 2021-05-18 NOTE — Assessment & Plan Note (Signed)
I have recommended complete cessation of tobacco use. I have discussed various options available for assistance with tobacco cessation including over the counter methods (Nicotine gum, patch and lozenges). We also discussed prescription options (Chantix, Nicotine Inhaler / Nasal Spray). The patient is not interested in pursuing any prescription tobacco cessation options at this time.  

## 2021-05-18 NOTE — Assessment & Plan Note (Signed)
Chronic, ongoing.  Continue Klonopin at this time, patient educated on risks of medication and is not interested in reducing -- refills sent in.  Continue Prozac 40 MG which benefits anxiety and fibromyalgia, discussed with her changing to Zoloft or Duloxetine if increase anxiety/depression noted.  At this time she denies SI/HI and wishes to maintain current regimen.  UDS yearly, contract up to date, next UDS today.  Return in 3 months.

## 2021-05-19 ENCOUNTER — Encounter: Payer: Self-pay | Admitting: *Deleted

## 2021-05-25 DIAGNOSIS — K219 Gastro-esophageal reflux disease without esophagitis: Secondary | ICD-10-CM | POA: Diagnosis not present

## 2021-05-25 DIAGNOSIS — K2270 Barrett's esophagus without dysplasia: Secondary | ICD-10-CM | POA: Diagnosis not present

## 2021-05-25 DIAGNOSIS — R110 Nausea: Secondary | ICD-10-CM | POA: Diagnosis not present

## 2021-05-25 DIAGNOSIS — K5909 Other constipation: Secondary | ICD-10-CM | POA: Diagnosis not present

## 2021-05-25 DIAGNOSIS — R0789 Other chest pain: Secondary | ICD-10-CM | POA: Diagnosis not present

## 2021-05-25 DIAGNOSIS — R1319 Other dysphagia: Secondary | ICD-10-CM | POA: Diagnosis not present

## 2021-05-27 DIAGNOSIS — R079 Chest pain, unspecified: Secondary | ICD-10-CM | POA: Diagnosis not present

## 2021-05-27 DIAGNOSIS — K2970 Gastritis, unspecified, without bleeding: Secondary | ICD-10-CM | POA: Diagnosis not present

## 2021-05-27 DIAGNOSIS — R1310 Dysphagia, unspecified: Secondary | ICD-10-CM | POA: Diagnosis not present

## 2021-05-27 DIAGNOSIS — K2270 Barrett's esophagus without dysplasia: Secondary | ICD-10-CM | POA: Diagnosis not present

## 2021-05-27 DIAGNOSIS — K219 Gastro-esophageal reflux disease without esophagitis: Secondary | ICD-10-CM | POA: Diagnosis not present

## 2021-05-27 DIAGNOSIS — R1319 Other dysphagia: Secondary | ICD-10-CM | POA: Diagnosis not present

## 2021-05-27 DIAGNOSIS — R110 Nausea: Secondary | ICD-10-CM | POA: Diagnosis not present

## 2021-06-03 ENCOUNTER — Other Ambulatory Visit: Payer: Self-pay

## 2021-06-03 ENCOUNTER — Ambulatory Visit
Admission: RE | Admit: 2021-06-03 | Discharge: 2021-06-03 | Disposition: A | Discharge status: Home / Self Care | Payer: Medicare Other | Source: Ambulatory Visit | Attending: Acute Care | Admitting: Acute Care

## 2021-06-03 DIAGNOSIS — F17210 Nicotine dependence, cigarettes, uncomplicated: Secondary | ICD-10-CM | POA: Insufficient documentation

## 2021-06-03 DIAGNOSIS — Z87891 Personal history of nicotine dependence: Secondary | ICD-10-CM | POA: Diagnosis not present

## 2021-06-10 ENCOUNTER — Telehealth: Payer: Self-pay | Admitting: Nurse Practitioner

## 2021-06-10 NOTE — Telephone Encounter (Signed)
Copied from [place] [id number]. Topic: General - Other >> Jun 10, 2021  1:30 PM Leward Quan A wrote: .Reason for CRM: Patient called in to ask Jolene [name] to call her because she want to discuss her Lung screen results would prefer a call from [facility] and not the nurse. please call Ph# [phone]

## 2021-06-14 NOTE — Telephone Encounter (Signed)
Patient declined visit but wants to speak with a nurse. Please advise.

## 2021-06-14 NOTE — Telephone Encounter (Signed)
Pt declined any appts, pt stated she would be willing to discuss the results with a nurse as well.

## 2021-06-14 NOTE — Telephone Encounter (Signed)
Called patient to schedule appointment, voicemail box has not been set up so I'm unable to leave a voicemail.

## 2021-06-14 NOTE — Telephone Encounter (Signed)
Patient declined to make an appointment. Is there anything we can tell the patient regarding her results?

## 2021-06-15 NOTE — Telephone Encounter (Signed)
Patient was notified of recent imaging results. Patient verbalized understanding and has no further questions at this time.

## 2021-07-06 ENCOUNTER — Ambulatory Visit: Payer: Self-pay | Admitting: *Deleted

## 2021-07-06 ENCOUNTER — Ambulatory Visit: Payer: Self-pay

## 2021-07-06 NOTE — Telephone Encounter (Signed)
Caller disconnected before [facility] agent could transfer call. Attempted to reach pt.x 2. Voice mailbox has not been set up, unable to leave message.

## 2021-07-06 NOTE — Telephone Encounter (Signed)
Patient's daughter called to request appt. Reported EMS with patient at this time and recommending to see PCP. Patient reports chest pain began 1-2 hours ago. Denies difficulty breathing. C/o dizziness and lightheadedness at times. C/o nausea at times . Denies severe chest pain at this time. C/o falling yesterday due to right leg "gave out". Fell and hit back and right arm and right hand. Denies hitting head. Patient reports she was alone. Hx stroke bruising to right hand, c/o back pain and low back pain on right side. C/o urinary incontinence last night . C/o urinary frequency and feels she may have a UTI. Denies fever, change in color or odor of urine. No report of burning during urination. Recommended for evaluation of chest pain to go to ED and patient and family reports EMS recommended to call PCP. Please advise if appt available earlier than scheduled appt for Friday 07/08/21. Care advise given. Patient verbalized understanding of care advise and to call back or go to ED or call 911 if symptoms worsen.

## 2021-07-06 NOTE — Telephone Encounter (Signed)
Pt has appointment scheduled for 11/18; she refuses to be seen by another provider and will wait until her appointment.  Provided pt provider's recommendations to go to UC/ED if needed.

## 2021-07-06 NOTE — Telephone Encounter (Signed)
Reason for Disposition  [1] Chest pain lasts > 5 minutes AND [2] occurred in past 3 days (72 hours) (Exception: feels exactly the same as previously diagnosed heartburn and has accompanying sour taste in mouth)  Answer Assessment - Initial Assessment Questions 1. LOCATION: "Where does it hurt?"       Middle to the left  2. RADIATION: "Does the pain go anywhere else?" (e.g., into neck, jaw, arms, back)     Back of neck ,and back  3. ONSET: "When did the chest pain begin?" (Minutes, hours or days)      Did not answer 4. PATTERN "Does the pain come and go, or has it been constant since it started?"  "Does it get worse with exertion?"      Not every day  5. DURATION: "How long does it last" (e.g., seconds, minutes, hours)     More than 5 minutes or an hour  6. SEVERITY: "How bad is the pain?"  (e.g., Scale 1-10; mild, moderate, or severe)    - MILD (1-3): doesn't interfere with normal activities     - MODERATE (4-7): interferes with normal activities or awakens from sleep    - SEVERE (8-10): excruciating pain, unable to do any normal activities       Wakes up during sleep  7. CARDIAC RISK FACTORS: "Do you have any history of heart problems or risk factors for heart disease?" (e.g., angina, prior heart attack; diabetes, high blood pressure, high cholesterol, smoker, or strong family history of heart disease)     Hx stroke  8. PULMONARY RISK FACTORS: "Do you have any history of lung disease?"  (e.g., blood clots in lung, asthma, emphysema, birth control pills)     na 9. CAUSE: "What do you think is causing the chest pain?"     Not sure. C/o low back pain and incontinent last nigh  10. OTHER SYMPTOMS: "Do you have any other symptoms?" (e.g., dizziness, nausea, vomiting, sweating, fever, difficulty breathing, cough)       Dizziness, nausea, lightheaded, fell 07/05/21. Low back pain and frequent urination. 11. PREGNANCY: "Is there any chance you are pregnant?" "When was your last menstrual  period?"       na  Protocols used: Chest Pain-A-AH

## 2021-07-06 NOTE — Telephone Encounter (Signed)
Called pt.'s phone again. Someone answered and states EMS is at the home with pt. "She started having chest pain." States they "need to get off phone."

## 2021-07-08 ENCOUNTER — Ambulatory Visit: Payer: Medicare Other | Admitting: Nurse Practitioner

## 2021-07-13 ENCOUNTER — Ambulatory Visit (INDEPENDENT_AMBULATORY_CARE_PROVIDER_SITE_OTHER): Payer: Medicare Other | Admitting: Nurse Practitioner

## 2021-07-13 ENCOUNTER — Other Ambulatory Visit: Payer: Self-pay

## 2021-07-13 ENCOUNTER — Encounter: Payer: Self-pay | Admitting: Nurse Practitioner

## 2021-07-13 VITALS — BP 121/76 | HR 64 | Wt 147.0 lb

## 2021-07-13 DIAGNOSIS — F1320 Sedative, hypnotic or anxiolytic dependence, uncomplicated: Secondary | ICD-10-CM | POA: Diagnosis not present

## 2021-07-13 DIAGNOSIS — R399 Unspecified symptoms and signs involving the genitourinary system: Secondary | ICD-10-CM | POA: Diagnosis not present

## 2021-07-13 DIAGNOSIS — F339 Major depressive disorder, recurrent, unspecified: Secondary | ICD-10-CM

## 2021-07-13 DIAGNOSIS — F419 Anxiety disorder, unspecified: Secondary | ICD-10-CM

## 2021-07-13 DIAGNOSIS — M25551 Pain in right hip: Secondary | ICD-10-CM | POA: Insufficient documentation

## 2021-07-13 LAB — URINALYSIS, ROUTINE W REFLEX MICROSCOPIC
Bilirubin, UA: NEGATIVE
Glucose, UA: NEGATIVE
Ketones, UA: NEGATIVE
Leukocytes,UA: NEGATIVE
Nitrite, UA: NEGATIVE
Protein,UA: NEGATIVE
Specific Gravity, UA: 1.01 (ref 1.005–1.030)
Urobilinogen, Ur: 0.2 mg/dL (ref 0.2–1.0)
pH, UA: 6.5 (ref 5.0–7.5)

## 2021-07-13 LAB — WET PREP FOR TRICH, YEAST, CLUE
Clue Cell Exam: NEGATIVE
Trichomonas Exam: NEGATIVE
Yeast Exam: NEGATIVE

## 2021-07-13 LAB — MICROSCOPIC EXAMINATION: WBC, UA: NONE SEEN /hpf (ref 0–5)

## 2021-07-13 MED ORDER — CLONAZEPAM 1 MG PO TABS: 1.0000 mg | ORAL_TABLET | Freq: Two times a day (BID) | ORAL | 2 refills | Status: DC

## 2021-07-13 MED ORDER — ONDANSETRON HCL 4 MG PO TABS: 4.0000 mg | ORAL_TABLET | Freq: Three times a day (TID) | ORAL | 0 refills | Status: DC | PRN

## 2021-07-13 MED ORDER — TIZANIDINE HCL 4 MG PO TABS: 4.0000 mg | ORAL_TABLET | Freq: Four times a day (QID) | ORAL | 0 refills | Status: DC | PRN

## 2021-07-13 NOTE — Assessment & Plan Note (Signed)
Acute, post fall x 2.  Will obtain imaging right hip to ensure no fracture present.  Hip arthritis could be underlying cause for her recent weakness in right leg, may also benefit from lumbar spine imaging in future if hip reassuring.  Scripts for Tizanidine and Zofran sent.  Return to office for worsening or ongoing, if ongoing consider ortho referral.

## 2021-07-13 NOTE — Assessment & Plan Note (Signed)
Chronic, stable.  Continue current medication regimen and adjust as needed.  Denies SI/HI.  Consider change from Prozac to Zoloft in future, which would offer benefit with anxiety and depression.  At this time she wishes not to change regimen.

## 2021-07-13 NOTE — Assessment & Plan Note (Signed)
Chronic, ongoing.  Continue Klonopin at this time, patient educated on risks of medication and is not interested in reducing -- refills sent in.  Continue Prozac 40 MG which benefits anxiety and fibromyalgia, discussed with her changing to Zoloft or Duloxetine if increase anxiety/depression noted.  At this time she denies SI/HI and wishes to maintain current regimen.  UDS yearly, contract up to date.  Return in 3 months.

## 2021-07-13 NOTE — Assessment & Plan Note (Signed)
Acute -- history of BV and UTI infections -- UA today trace blood, no other findings.  Wet prep negative. Overall reassuring.  Plan on urine recheck next visit. Educated patient on findings.  Discomfort appears to be present more to right hip, which she landed on with fall.

## 2021-07-13 NOTE — Patient Instructions (Signed)
Hip Pain The hip is the joint between the upper legs and the lower pelvis. The bones, cartilage, tendons, and muscles of your hip joint support your body and allow you to move around. Hip pain can range from a minor ache to severe pain in one or both of your hips. The pain may be felt on the inside of the hip joint near the groin, or on the outside near the buttocks and upper thigh. You may also have swelling or stiffness in your hip area. Follow these instructions at home: Managing pain, stiffness, and swelling   If directed, put ice on the painful area. To do this: Put ice in a plastic bag. Place a towel between your skin and the bag. Leave the ice on for 20 minutes, 2-3 times a day. If directed, apply heat to the affected area as often as told by your health care provider. Use the heat source that your health care provider recommends, such as a moist heat pack or a heating pad. Place a towel between your skin and the heat source. Leave the heat on for 20-30 minutes. Remove the heat if your skin turns bright red. This is especially important if you are unable to feel pain, heat, or cold. You may have a greater risk of getting burned. Activity Do exercises as told by your health care provider. Avoid activities that cause pain. General instructions  Take over-the-counter and prescription medicines only as told by your health care provider. Keep a journal of your symptoms. Write down: How often you have hip pain. The location of your pain. What the pain feels like. What makes the pain worse. Sleep with a pillow between your legs on your most comfortable side. Keep all follow-up visits as told by your health care provider. This is important. Contact a health care provider if: You cannot put weight on your leg. Your pain or swelling continues or gets worse after one week. It gets harder to walk. You have a fever. Get help right away if: You fall. You have a sudden increase in pain and  swelling in your hip. Your hip is red or swollen or very tender to touch. Summary Hip pain can range from a minor ache to severe pain in one or both of your hips. The pain may be felt on the inside of the hip joint near the groin, or on the outside near the buttocks and upper thigh. Avoid activities that cause pain. Write down how often you have hip pain, the location of the pain, what makes it worse, and what it feels like. This information is not intended to replace advice given to you by your health care provider. Make sure you discuss any questions you have with your health care provider. Document Revised: 12/23/2018 Document Reviewed: 12/23/2018 Elsevier Patient Education  2022 [employer]  

## 2021-07-13 NOTE — Progress Notes (Signed)
BP 121/76   Pulse 64   Wt 147 lb (66.7 kg)   SpO2 96%   BMI 27.78 kg/m    Subjective:    Patient ID: Lindsey Bautista, female    DOB: 1960-12-16, 60 y.o.   MRN: [id number]  HPI: Lindsey Bautista is a 60 y.o. female  Chief Complaint  Patient presents with   Urinary Tract Infection    Patient states she thinks she may have some sort of infection UTI or BV. Patient states she is having a lot pressure in her vaginal area. Patient states she is also having pain in her sides. Patient states she first noticed it around 3 weeks ago and states she fell and she has been really bad in her side since then. Patient states she fell again the Tuesday after the first fall and states she had more pain. Patient states she doesn't if she is hurting from the fall or UTI symptoms.    URINARY SYMPTOMS Reports concerns for infection due to pressure to vaginal area, but no discharge.  Having discomfort to right side, but had fall 3 weeks ago and her husband reports she landed on that side.  Then fell a second time one week later.  Reports her right leg feels weak, which has been present for some time -- leg gave way on her during falls.    Has history of BV recurrence and UTI, she is concerned more for this today.   Dysuria: no Urinary frequency: no Urgency: no Small volume voids: yes Symptom severity: yes Urinary incontinence: yes Foul odor: no Hematuria: no Abdominal pain: yes Back pain: yes Suprapubic pain/pressure: yes Flank pain: no Fever:  no Vomiting: no Status: stable Previous urinary tract infection: yes Recurrent urinary tract infection: no Sexual activity: No sexually active History of sexually transmitted disease: no Treatments attempted: increasing fluids    DEPRESSION/ANXIETY Taking Prozac 40 MG daily and Klonopin 1 MG BID.  Pt is aware of risks of benzo medication use to include increased sedation, respiratory suppression, falls, dependence and cardiovascular events.  Pt would like  to continue treatment as benefit determined to outweigh risk.  On PDMP review last Klonopin fill 06/14/21 for #60.  No other controlled substances noted.  Overall mood well-controlled with this.   Mood status: stable Satisfied with current treatment?: yes Symptom severity: mild  Duration of current treatment : chronic Side effects: no Medication compliance: good compliance Psychotherapy/counseling: none Previous psychiatric medications: Prozac and Klonopin Depressed mood: no Anxious mood: yes Anhedonia: no Significant weight loss or gain: no Insomnia: yes hard to fall asleep Fatigue: at times Feelings of worthlessness or guilt: none Impaired concentration/indecisiveness: yes Suicidal ideations: no Hopelessness: no Crying spells: no Depression screen [facility] 2/9 07/13/2021 05/18/2021 02/16/2021 12/27/2020 09/29/2020  Decreased Interest 1 1 0 0 0  Down, Depressed, Hopeless 0 0 0 1 1  PHQ - 2 Score 1 1 0 1 1  Altered sleeping 3 0 1 1 1   Tired, decreased energy 2 3 0 0 1  Change in appetite 2 0 0 0 1  Feeling bad or failure about yourself  1 1 0 0 0  Trouble concentrating 1 0 0 0 0  Moving slowly or fidgety/restless 1 0 0 0 0  Suicidal thoughts 0 0 0 0 0  PHQ-9 Score 11 5 1 2 4   Difficult doing work/chores Not difficult at all Not difficult at all Not difficult at all Not difficult at all -  Some recent data might  be hidden    GAD 7 : Generalized Anxiety Score 07/13/2021 05/18/2021 02/16/2021 12/27/2020  Nervous, Anxious, on Edge 1 1 2 2   Control/stop worrying 1 1 2 2   Worry too much - different things 1 1 1 2   Trouble relaxing 1 1 1 1   Restless 1 1 1 1   Easily annoyed or irritable 2 1 1 1   Afraid - awful might happen 1 1 1 1   Total GAD 7 Score 8 7 9 10   Anxiety Difficulty Somewhat difficult Somewhat difficult Somewhat difficult Somewhat difficult    Relevant past medical, surgical, family and social history reviewed and updated as indicated. Interim medical history since our last visit  reviewed. Allergies and medications reviewed and updated.  Review of Systems  Constitutional:  Negative for activity change, appetite change, diaphoresis, fatigue and fever.  Respiratory:  Negative for cough, chest tightness, shortness of breath and wheezing.   Cardiovascular:  Negative for chest pain, palpitations and leg swelling.  Gastrointestinal:  Positive for abdominal pain and nausea. Negative for abdominal distention, constipation, diarrhea and vomiting.  Endocrine: Negative for cold intolerance and heat intolerance.  Neurological: Negative.   Psychiatric/Behavioral:  Negative for decreased concentration, self-injury, sleep disturbance and suicidal ideas. The patient is nervous/anxious.    Per HPI unless specifically indicated above     Objective:    BP 121/76   Pulse 64   Wt 147 lb (66.7 kg)   SpO2 96%   BMI 27.78 kg/m   Wt Readings from Last 3 Encounters:  07/13/21 147 lb (66.7 kg)  06/03/21 145 lb (65.8 kg)  05/18/21 145 lb (65.8 kg)    Physical Exam Vitals and nursing note reviewed.  Constitutional:      General: She is awake. She is not in acute distress.    Appearance: She is well-developed, well-groomed and overweight. She is not ill-appearing.  HENT:     Head: Normocephalic.     Right Ear: Hearing normal.     Left Ear: Hearing normal.  Eyes:     General: Lids are normal.        Right eye: No discharge.        Left eye: No discharge.     Conjunctiva/sclera: Conjunctivae normal.     Pupils: Pupils are equal, round, and reactive to light.  Neck:     Thyroid: No thyromegaly.     Vascular: No carotid bruit.  Cardiovascular:     Rate and Rhythm: Normal rate and regular rhythm.     Heart sounds: Normal heart sounds. No murmur heard.   No gallop.  Pulmonary:     Effort: Pulmonary effort is normal. No accessory muscle usage or respiratory distress.     Breath sounds: Normal breath sounds.  Abdominal:     General: Bowel sounds are normal. There is no  distension.     Palpations: Abdomen is soft.     Tenderness: There is no abdominal tenderness. There is no right CVA tenderness, left CVA tenderness, guarding or rebound.     Hernia: No hernia is present.  Musculoskeletal:     Cervical back: Normal range of motion and neck supple.     Right hip: Tenderness (to anterior aspect.) present. No deformity, lacerations, bony tenderness or crepitus. Normal range of motion. Normal strength.     Left hip: Normal.     Right lower leg: No edema.     Left lower leg: No edema.  Skin:    General: Skin is warm and dry.  Neurological:     Mental Status: She is alert and oriented to person, place, and time.  Psychiatric:        Attention and Perception: Attention normal.        Mood and Affect: Mood normal.        Behavior: Behavior normal. Behavior is cooperative.        Thought Content: Thought content normal.        Judgment: Judgment normal.   Results for orders placed or performed in visit on 07/13/21  WET PREP FOR [place], YEAST, CLUE   Specimen: Sterile Swab   Sterile Swab  Result Value Ref Range   Trichomonas Exam Negative Negative   Yeast Exam Negative Negative   Clue Cell Exam Negative Negative  Microscopic Examination  Result Value Ref Range   WBC, UA None seen 0 - 5 /hpf   RBC 0-2 0 - 2 /hpf   Epithelial Cells (non renal) 0-10 0 - 10 /hpf  Urinalysis, Routine w reflex microscopic  Result Value Ref Range   Specific Gravity, UA 1.010 1.005 - 1.030   pH, UA 6.5 5.0 - 7.5   Color, UA Yellow Yellow   Appearance Ur Clear Clear   Leukocytes,UA Negative Negative   Protein,UA Negative Negative/Trace   Glucose, UA Negative Negative   Ketones, UA Negative Negative   RBC, UA Trace (A) Negative   Bilirubin, UA Negative Negative   Urobilinogen, Ur 0.2 0.2 - 1.0 mg/dL   Nitrite, UA Negative Negative   Microscopic Examination See below:       Assessment & Plan:   Problem List Items Addressed This Visit       Other   Anxiety     Chronic, ongoing.  Continue Klonopin at this time, patient educated on risks of medication and is not interested in reducing -- refills sent in.  Continue Prozac 40 MG which benefits anxiety and fibromyalgia, discussed with her changing to Zoloft or Duloxetine if increase anxiety/depression noted.  At this time she denies SI/HI and wishes to maintain current regimen.  UDS yearly, contract up to date.  Return in 3 months.      Benzodiazepine dependence, continuous ([place])    Refer to anxiety plan for updates and discussion.      Depression, recurrent ([place]) - Primary    Chronic, stable.  Continue current medication regimen and adjust as needed.  Denies SI/HI.  Consider change from Prozac to Zoloft in future, which would offer benefit with anxiety and depression.  At this time she wishes not to change regimen.      Right hip pain    Acute, post fall x 2.  Will obtain imaging right hip to ensure no fracture present.  Hip arthritis could be underlying cause for her recent weakness in right leg, may also benefit from lumbar spine imaging in future if hip reassuring.  Scripts for Tizanidine and Zofran sent.  Return to office for worsening or ongoing, if ongoing consider ortho referral.      Relevant Orders   DG Hip Unilat W OR W/O Pelvis 2-3 Views Right   Urinary symptom or sign    Acute -- history of BV and UTI infections -- UA today trace blood, no other findings.  Wet prep negative. Overall reassuring.  Plan on urine recheck next visit. Educated patient on findings.  Discomfort appears to be present more to right hip, which she landed on with fall.      Relevant Orders   WET PREP  FOR [place], YEAST, CLUE (Completed)     Follow up plan: Return in about 3 months (around 10/13/2021) for HLD, COPD, MOOD, GERD -- spirometry.

## 2021-07-13 NOTE — Assessment & Plan Note (Signed)
Refer to anxiety plan for updates and discussion.

## 2021-07-18 ENCOUNTER — Ambulatory Visit
Admission: RE | Admit: 2021-07-18 | Discharge: 2021-07-18 | Disposition: A | Discharge status: Home / Self Care | Payer: Medicare Other | Source: Ambulatory Visit | Attending: Nurse Practitioner | Admitting: Nurse Practitioner

## 2021-07-18 ENCOUNTER — Ambulatory Visit
Admission: RE | Admit: 2021-07-18 | Discharge: 2021-07-18 | Disposition: A | Discharge status: Home / Self Care | Payer: Medicare Other | Attending: Nurse Practitioner | Admitting: Nurse Practitioner

## 2021-07-18 DIAGNOSIS — M25551 Pain in right hip: Secondary | ICD-10-CM | POA: Insufficient documentation

## 2021-07-20 ENCOUNTER — Telehealth: Payer: Self-pay | Admitting: Nurse Practitioner

## 2021-07-20 NOTE — Telephone Encounter (Signed)
Patient was notified of her recent imaging results and made aware of recommendations. Patient verbalized understanding and patient states she is still having pain, but the pain is mainly in (belly) pelvic lower abdomen region and states it still hurts really bad, and states she knows she has the pain in her hips and lower back, but she does not want to do the physical therapy at this moment. Patient states she does not like to take pain medication and she says this morning when she woke up she could barely move and she doesn't know if it is the arthritis or something else. Patient states she has convinced herself that she has cancer and she would like to discuss with provider, as she does not know what is going on. Please advise?

## 2021-07-20 NOTE — Progress Notes (Signed)
Good afternoon, please let Lindsey Bautista know her imaging has returned.  There is arthritis noted to lower back and both hips, which is where I suspect some of her pain is coming from.  If she is having ongoing pain then I recommend a referral to physical therapy to work on discomfort and stretches.  If she wishes to pursue this let me know.  Any questions? Keep being awesome!!  Thank you for allowing me to participate in your care.  I appreciate you. Kindest regards, Brittin Janik

## 2021-07-20 NOTE — Telephone Encounter (Signed)
Copied from [facility] [id number]. Topic: General - Other >> Jul 20, 2021  1:24 PM McGill, Nelva Bush wrote: Reason for CRM: pt calling to speak with PCP or nurse for imaging results.  Please advise.

## 2021-07-20 NOTE — Progress Notes (Signed)
Please call patient and let them  know their  low dose Ct was read as a Lung RADS 2: nodules that are benign in appearance and behavior with a very low likelihood of becoming a clinically active cancer due to size or lack of growth. Recommendation per radiology is for a repeat LDCT in 12 months. .Please let them  know we will order and schedule their  annual screening scan for 05/2022. Please let them  know there was notation of CAD on their  scan.  Please remind the patient  that this is a non-gated exam therefore degree or severity of disease  cannot be determined. Please have them  follow up with their PCP regarding potential risk factor modification, dietary therapy or pharmacologic therapy if clinically indicated. Pt.  is  currently on statin therapy. Please place order for annual  screening scan for  05/2022 and fax results to PCP. Thanks so much.  She does have notation of CAD. She is on statin, make sure she follows up with her PCP.  She has a stable right adrenal adenoma. This has been noted on all scans. These are common benign  tumors of the kidney.   Place order for annual screening scan , send result to PCP . Thanks so much.

## 2021-07-21 ENCOUNTER — Other Ambulatory Visit: Payer: Self-pay

## 2021-07-21 ENCOUNTER — Telehealth: Payer: Self-pay | Admitting: Nurse Practitioner

## 2021-07-21 DIAGNOSIS — Z87891 Personal history of nicotine dependence: Secondary | ICD-10-CM

## 2021-07-21 DIAGNOSIS — F17210 Nicotine dependence, cigarettes, uncomplicated: Secondary | ICD-10-CM

## 2021-07-21 NOTE — Telephone Encounter (Signed)
Patient was notified and made aware of Jolene's recommendations. Patient verbalized understanding and has no further questions.

## 2021-07-21 NOTE — Telephone Encounter (Signed)
Copied from [place] [id number]. Topic: Quick Communication - Lab Results (Clinic Use ONLY) >> Jul 21, 2021  3:10 PM Georgina Peer, [place] wrote: Called patient to inform them of lab results. When patient returns call, triage nurse may disclose results. Result note routed to nurse triage pool.

## 2021-07-21 NOTE — Telephone Encounter (Signed)
See result notes. 

## 2021-07-25 ENCOUNTER — Ambulatory Visit: Payer: Self-pay

## 2021-07-25 NOTE — Telephone Encounter (Signed)
Pt. Reports she started getting sick Friday. Fever 101.5. "Tylenol doesn't help it." Reports she is not eating and drinking. Feels weak like she's "going to pass out." Has chills, headache and body aches. Lives with husband - "he's really sick too. We don't have anyone to help [place]." Instructed to call 911. Verbalizes understanding.   Reason for Disposition  Sounds like a life-threatening emergency to the triager  Answer Assessment - Initial Assessment Questions 1. TEMPERATURE: "What is the most recent temperature?"  "How was it measured?"      101.5 2. ONSET: "When did the fever start?"      Friday 3. CHILLS: "Do you have chills?" If yes: "How bad are they?"  (e.g., none, mild, moderate, severe)   - NONE: no chills   - MILD: feeling cold   - MODERATE: feeling very cold, some shivering (feels better under a thick blanket)   - SEVERE: feeling extremely cold with shaking chills (general body shaking, rigors; even under a thick blanket)      Moderate 4. OTHER SYMPTOMS: "Do you have any other symptoms besides the fever?"  (e.g., abdomen pain, cough, diarrhea, earache, headache, sore throat, urination pain)     Headache,body aches, not eating, weak and dizzy 5. CAUSE: If there are no symptoms, ask: "What do you think is causing the fever?"      Unsure 6. CONTACTS: "Does anyone else in the family have an infection?"     Husband 30. TREATMENT: "What have you done so far to treat this fever?" (e.g., medications)     Tylenol 8. IMMUNOCOMPROMISE: "Do you have of the following: diabetes, HIV positive, splenectomy, cancer chemotherapy, chronic steroid treatment, transplant patient, etc."     No 9. PREGNANCY: "Is there any chance you are pregnant?" "When was your last menstrual period?"     No 10. TRAVEL: "Have you traveled out of the country in the last month?" (e.g., travel history, exposures)       No  Protocols used: [facility]

## 2021-07-27 ENCOUNTER — Ambulatory Visit: Payer: Self-pay | Admitting: *Deleted

## 2021-07-27 NOTE — Telephone Encounter (Signed)
Reason for Disposition  Sounds like a life-threatening emergency to the triager    Positive for Covid.   So weak she has passed out twice.  Answer Assessment - Initial Assessment Questions 1. COVID-19 DIAGNOSIS: "Who made your COVID-19 diagnosis?" "Was it confirmed by a positive lab test or self-test?" If not diagnosed by a doctor (or NP/PA), ask "Are there lots of cases (community spread) where you live?" Note: See public health department website, if unsure.     Pt calling in.   She is positive for Covid.    2. COVID-19 EXPOSURE: "Was there any known exposure to COVID before the symptoms began?" CDC Definition of close contact: within 6 feet (2 meters) for a total of 15 minutes or more over a 24-hour period.      *No Answer* 3. ONSET: "When did the COVID-19 symptoms start?"      Friday night my symptoms. Fever, headache, aching all over, diarrhea, chest congestion, coughing up mucus, I can't stay up I'm so weak.   If I get up to walk I feel like I'm going to pass out.   Last night I passed out.  I had diarrhea and then I passed out.   I don't know how long I was out.   I've passed out twice.   Monday I passed out too.    I haven't eaten since Friday.  I'm dehydrated. I can't taste nothing.   Does not have diabetes.   I'm only able to get 16 oz water down a day.    I vomited one time.  4. WORST SYMPTOM: "What is your worst symptom?" (e.g., cough, fever, shortness of breath, muscle aches)     Very weak 5. COUGH: "Do you have a cough?" If Yes, ask: "How bad is the cough?"       Yes  6. FEVER: "Do you have a fever?" If Yes, ask: "What is your temperature, how was it measured, and when did it start?"     Yes 7. RESPIRATORY STATUS: "Describe your breathing?" (e.g., shortness of breath, wheezing, unable to speak)      "I'm heavy in my chest" 8. BETTER-SAME-WORSE: "Are you getting better, staying the same or getting worse compared to yesterday?"  If getting worse, ask, "In what way?"     worse 9.  HIGH RISK DISEASE: "Do you have any chronic medical problems?" (e.g., asthma, heart or lung disease, weak immune system, obesity, etc.)     Does not have diabetes but is so weak she has passed out twice this week.   I have instructed her to call 911.    10. VACCINE: "Have you had the COVID-19 vaccine?" If Yes, ask: "Which one, how many shots, when did you get it?"       Not asked 11. BOOSTER: "Have you received your COVID-19 booster?" If Yes, ask: "Which one and when did you get it?"       Not asked 12. PREGNANCY: "Is there any chance you are pregnant?" "When was your last menstrual period?"       NA 13. OTHER SYMPTOMS: "Do you have any other symptoms?"  (e.g., chills, fatigue, headache, loss of smell or taste, muscle pain, sore throat)       Headache, fever, coughing, loss of taste, very weak and passing out, aching all over. 14. O2 SATURATION MONITOR:  "Do you use an oxygen saturation monitor (pulse oximeter) at home?" If Yes, ask "What is your reading (oxygen level) today?" "What is your  usual oxygen saturation reading?" (e.g., 95%)       Not asked  Protocols used: Coronavirus (COVID-19) Diagnosed or Suspected-[facility]

## 2021-07-27 NOTE — Telephone Encounter (Signed)
Pt called in because she tested positive for Covid today.   Her voice is very weak and a little shaky.   She started having symptoms Friday night.   She mentioned her husband is real sick with Covid too.   See triage notes for all her symptoms.   She mentioned she is so weak and feels heavy in her chest.   She has also passed out twice this week from being so weak.   She is not able to keep any food down and has not eaten in several days.   She's only drinking about 16 oz of water a day when I asked her about fluids.   She had "a little diarrhea this morning".    She passed out this morning after having diarrhea.    I instructed her to call 911 and being taken to the hospital for treatment due to severe weakness, passing out, and being so sick with Covid.    She was agreeable to this plan.  She mentioned her husband was positive for Covid too and just as sick as she was.   I instructed her to have the ambulance take both of them to the ED.   She was agreeable.  I asked if she could call 911 and she said she could and will call them.    I sent my notes to [facility].

## 2021-07-27 NOTE — Telephone Encounter (Signed)
Noted  

## 2021-07-28 ENCOUNTER — Encounter: Payer: Self-pay | Admitting: Nurse Practitioner

## 2021-07-28 ENCOUNTER — Ambulatory Visit (INDEPENDENT_AMBULATORY_CARE_PROVIDER_SITE_OTHER): Payer: Medicare Other | Admitting: Nurse Practitioner

## 2021-07-28 DIAGNOSIS — U071 COVID-19: Secondary | ICD-10-CM | POA: Insufficient documentation

## 2021-07-28 MED ORDER — AZITHROMYCIN 250 MG PO TABS: ORAL_TABLET | ORAL | 0 refills | Status: AC

## 2021-07-28 MED ORDER — LIDOCAINE VISCOUS HCL 2 % MT SOLN: 15.0000 mL | OROMUCOSAL | 0 refills | Status: DC | PRN

## 2021-07-28 MED ORDER — PREDNISONE 20 MG PO TABS: 40.0000 mg | ORAL_TABLET | Freq: Every day | ORAL | 0 refills | Status: AC

## 2021-07-28 NOTE — Assessment & Plan Note (Signed)
Symptoms starting 6 days ago, with ongoing symptoms but no red flags -- no SOB, wheezing, or CP.  At this time is past window for oral treatment.  Will send in Prednisone and Azithromycin for treatment + viscous Lidocaine to use as needed.  Recommend self quarantine for 5 days and then 5 days of masking.  Recommend: - Increased rest - Increasing Fluids - Acetaminophen / ibuprofen as needed for fever/pain.  - Salt water gargling, chloraseptic spray and throat lozenges - Mucinex.  At this time return in one week for follow-up, sooner if worsening.

## 2021-07-28 NOTE — Patient Instructions (Signed)
COVID-19: Quarantine and Isolation °Quarantine °If you were exposed °Quarantine and stay away from others when you have been in close contact with someone who has COVID-19. °Isolate °If you are sick or test positive °Isolate when you are sick or when you have COVID-19, even if you don't have symptoms. °When to stay home °Calculating quarantine °The date of your exposure is considered day 0. Day 1 is the first full day after your last contact with a person who has had COVID-19. Stay home and away from other people for at least 5 days. Learn why CDC updated guidance for the general public. °IF YOU were exposed to COVID-19 and are NOT  °up to dateIF YOU were exposed to COVID-19 and are NOT on COVID-19 vaccinations °Quarantine for at least 5 days °Stay home °Stay home and quarantine for at least 5 full days. °Wear a well-fitting mask if you must be around others in your home. °Do not travel. °Get tested °Even if you don't develop symptoms, get tested at least 5 days after you last had close contact with someone with COVID-19. °After quarantine °Watch for symptoms °Watch for symptoms until 10 days after you last had close contact with someone with COVID-19. °Avoid travel °It is best to avoid travel until a full 10 days after you last had close contact with someone with COVID-19. °If you develop symptoms °Isolate immediately and get tested. Continue to stay home until you know the results. Wear a well-fitting mask around others. °Take precautions until day 10 °Wear a well-fitting mask °Wear a well-fitting mask for 10 full days any time you are around others inside your home or in public. Do not go to places where you are unable to wear a well-fitting mask. °If you must travel during days 6-10, take precautions. °Avoid being around people who are more likely to get very sick from COVID-19. °IF YOU were exposed to COVID-19 and are  °up to dateIF YOU were exposed to COVID-19 and are on COVID-19 vaccinations °No  quarantine °You do not need to stay home unless you develop symptoms. °Get tested °Even if you don't develop symptoms, get tested at least 5 days after you last had close contact with someone with COVID-19. °Watch for symptoms °Watch for symptoms until 10 days after you last had close contact with someone with COVID-19. °If you develop symptoms °Isolate immediately and get tested. Continue to stay home until you know the results. Wear a well-fitting mask around others. °Take precautions until day 10 °Wear a well-fitting mask °Wear a well-fitting mask for 10 full days any time you are around others inside your home or in public. Do not go to places where you are unable to wear a well-fitting mask. °Take precautions if traveling °Avoid being around people who are more likely to get very sick from COVID-19. °IF YOU were exposed to COVID-19 and had confirmed COVID-19 within the past 90 days (you tested positive using a viral test) °No quarantine °You do not need to stay home unless you develop symptoms. °Watch for symptoms °Watch for symptoms until 10 days after you last had close contact with someone with COVID-19. °If you develop symptoms °Isolate immediately and get tested. Continue to stay home until you know the results. Wear a well-fitting mask around others. °Take precautions until day 10 °Wear a well-fitting mask °Wear a well-fitting mask for 10 full days any time you are around others inside your home or in public. Do not go to places where you are   unable to wear a well-fitting mask. °Take precautions if traveling °Avoid being around people who are more likely to get very sick from COVID-19. °Calculating isolation °Day 0 is your first day of symptoms or a positive viral test. Day 1 is the first full day after your symptoms developed or your test specimen was collected. If you have COVID-19 or have symptoms, isolate for at least 5 days. °IF YOU tested positive for COVID-19 or have symptoms, regardless of  vaccination status °Stay home for at least 5 days °Stay home for 5 days and isolate from others in your home. °Wear a well-fitting mask if you must be around others in your home. °Do not travel. °Ending isolation if you had symptoms °End isolation after 5 full days if you are fever-free for 24 hours (without the use of fever-reducing medication) and your symptoms are improving. °Ending isolation if you did NOT have symptoms °End isolation after at least 5 full days after your positive test. °If you got very sick from COVID-19 or have a weakened immune system °You should isolate for at least 10 days. Consult your doctor before ending isolation. °Take precautions until day 10 °Wear a well-fitting mask °Wear a well-fitting mask for 10 full days any time you are around others inside your home or in public. Do not go to places where you are unable to wear a well-fitting mask. °Do not travel °Do not travel until a full 10 days after your symptoms started or the date your positive test was taken if you had no symptoms. °Avoid being around people who are more likely to get very sick from COVID-19. °Definitions °Exposure °Contact with someone infected with SARS-CoV-2, the virus that causes COVID-19, in a way that increases the likelihood of getting infected with the virus. °Close contact °A close contact is someone who was less than 6 feet away from an infected person (laboratory-confirmed or a clinical diagnosis) for a cumulative total of 15 minutes or more over a 24-hour period. For example, three individual 5-minute exposures for a total of 15 minutes. People who are exposed to someone with COVID-19 after they completed at least 5 days of isolation are not considered close contacts. °Quarantine °Quarantine is a strategy used to prevent transmission of COVID-19 by keeping people who have been in close contact with someone with COVID-19 apart from others. °Who does not need to quarantine? °If you had close contact with  someone with COVID-19 and you are in one of the following groups, you do not need to quarantine. °You are up to date with your COVID-19 vaccines. °You had confirmed COVID-19 within the last 90 days (meaning you tested positive using a viral test). °If you are up to date with COVID-19 vaccines, you should wear a well-fitting mask around others for 10 days from the date of your last close contact with someone with COVID-19 (the date of last close contact is considered day 0). Get tested at least 5 days after you last had close contact with someone with COVID-19. If you test positive or develop COVID-19 symptoms, isolate from other people and follow recommendations in the Isolation section below. If you tested positive for COVID-19 with a viral test within the previous 90 days and subsequently recovered and remain without COVID-19 symptoms, you do not need to quarantine or get tested after close contact. You should wear a well-fitting mask around others for 10 days from the date of your last close contact with someone with COVID-19 (the date of last   close contact is considered day 0). If you have COVID-19 symptoms, get tested and isolate from other people and follow recommendations in the Isolation section below. °Who should quarantine? °If you come into close contact with someone with COVID-19, you should quarantine if you are not up to date on COVID-19 vaccines. This includes people who are not vaccinated. °What to do for quarantine °Stay home and away from other people for at least 5 days (day 0 through day 5) after your last contact with a person who has COVID-19. The date of your exposure is considered day 0. Wear a well-fitting mask when around others at home, if possible. °For 10 days after your last close contact with someone with COVID-19, watch for fever (100.4°F or greater), cough, shortness of breath, or other COVID-19 symptoms. °If you develop symptoms, get tested immediately and isolate until you receive  your test results. If you test positive, follow isolation recommendations. °If you do not develop symptoms, get tested at least 5 days after you last had close contact with someone with COVID-19. °If you test negative, you can leave your home, but continue to wear a well-fitting mask when around others at home and in public until 10 days after your last close contact with someone with COVID-19. °If you test positive, you should isolate for at least 5 days from the date of your positive test (if you do not have symptoms). If you do develop COVID-19 symptoms, isolate for at least 5 days from the date your symptoms began (the date the symptoms started is day 0). Follow recommendations in the isolation section below. °If you are unable to get a test 5 days after last close contact with someone with COVID-19, you can leave your home after day 5 if you have been without COVID-19 symptoms throughout the 5-day period. Wear a well-fitting mask for 10 days after your date of last close contact when around others at home and in public. °Avoid people who are have weakened immune systems or are more likely to get very sick from COVID-19, and nursing homes and other high-risk settings, until after at least 10 days. °If possible, stay away from people you live with, especially people who are at higher risk for getting very sick from COVID-19, as well as others outside your home throughout the full 10 days after your last close contact with someone with COVID-19. °If you are unable to quarantine, you should wear a well-fitting mask for 10 days when around others at home and in public. °If you are unable to wear a mask when around others, you should continue to quarantine for 10 days. Avoid people who have weakened immune systems or are more likely to get very sick from COVID-19, and nursing homes and other high-risk settings, until after at least 10 days. °See additional information about travel. °Do not go to places where you are  unable to wear a mask, such as restaurants and some gyms, and avoid eating around others at home and at work until after 10 days after your last close contact with someone with COVID-19. °After quarantine °Watch for symptoms until 10 days after your last close contact with someone with COVID-19. °If you have symptoms, isolate immediately and get tested. °Quarantine in high-risk congregate settings °In certain congregate settings that have high risk of secondary transmission (such as correctional and detention facilities, homeless shelters, or cruise ships), CDC recommends a 10-day quarantine for residents, regardless of vaccination and booster status. During periods of critical staffing   shortages, facilities may consider shortening the quarantine period for staff to ensure continuity of operations. Decisions to shorten quarantine in these settings should be made in consultation with state, local, tribal, or territorial health departments and should take into consideration the context and characteristics of the facility. CDC's setting-specific guidance provides additional recommendations for these settings. °Isolation °Isolation is used to separate people with confirmed or suspected COVID-19 from those without COVID-19. People who are in isolation should stay home until it's safe for them to be around others. At home, anyone sick or infected should separate from others, or wear a well-fitting mask when they need to be around others. People in isolation should stay in a specific "sick room" or area and use a separate bathroom if available. Everyone who has presumed or confirmed COVID-19 should stay home and isolate from other people for at least 5 full days (day 0 is the first day of symptoms or the date of the day of the positive viral test for asymptomatic persons). They should wear a mask when around others at home and in public for an additional 5 days. People who are confirmed to have COVID-19 or are showing  symptoms of COVID-19 need to isolate regardless of their vaccination status. This includes: °People who have a positive viral test for COVID-19, regardless of whether or not they have symptoms. °People with symptoms of COVID-19, including people who are awaiting test results or have not been tested. People with symptoms should isolate even if they do not know if they have been in close contact with someone with COVID-19. °What to do for isolation °Monitor your symptoms. If you have an emergency warning sign (including trouble breathing), seek emergency medical care immediately. °Stay in a separate room from other household members, if possible. °Use a separate bathroom, if possible. °Take steps to improve ventilation at home, if possible. °Avoid contact with other members of the household and pets. °Don't share personal household items, like cups, towels, and utensils. °Wear a well-fitting mask when you need to be around other people. °Learn more about what to do if you are sick and how to notify your contacts. °Ending isolation for people who had COVID-19 and had symptoms °If you had COVID-19 and had symptoms, isolate for at least 5 days. To calculate your 5-day isolation period, day 0 is your first day of symptoms. Day 1 is the first full day after your symptoms developed. You can leave isolation after 5 full days. °You can end isolation after 5 full days if you are fever-free for 24 hours without the use of fever-reducing medication and your other symptoms have improved (Loss of taste and smell may persist for weeks or months after recovery and need not delay the end of isolation). °You should continue to wear a well-fitting mask around others at home and in public for 5 additional days (day 6 through day 10) after the end of your 5-day isolation period. If you are unable to wear a mask when around others, you should continue to isolate for a full 10 days. Avoid people who have weakened immune systems or are more  likely to get very sick from COVID-19, and nursing homes and other high-risk settings, until after at least 10 days. °If you continue to have fever or your other symptoms have not improved after 5 days of isolation, you should wait to end your isolation until you are fever-free for 24 hours without the use of fever-reducing medication and your other symptoms have improved.   Continue to wear a well-fitting mask through day 10. Contact your healthcare provider if you have questions. °See additional information about travel. °Do not go to places where you are unable to wear a mask, such as restaurants and some gyms, and avoid eating around others at home and at work until a full 10 days after your first day of symptoms. °If an individual has access to a test and wants to test, the best approach is to use an antigen test1 towards the end of the 5-day isolation period. Collect the test sample only if you are fever-free for 24 hours without the use of fever-reducing medication and your other symptoms have improved (loss of taste and smell may persist for weeks or months after recovery and need not delay the end of isolation). If your test result is positive, you should continue to isolate until day 10. If your test result is negative, you can end isolation, but continue to wear a well-fitting mask around others at home and in public until day 10. Follow additional recommendations for masking and avoiding travel as described above. °1As noted in the labeling for authorized over-the counter antigen tests: Negative results should be treated as presumptive. Negative results do not rule out SARS-CoV-2 infection and should not be used as the sole basis for treatment or patient management decisions, including infection control decisions. To improve results, antigen tests should be used twice over a three-day period with at least 24 hours and no more than 48 hours between tests. °Note that these recommendations on ending isolation  do not apply to people who are moderately ill or very sick from COVID-19 or have weakened immune systems. See section below for recommendations for when to end isolation for these groups. °Ending isolation for people who tested positive for COVID-19 but had no symptoms °If you test positive for COVID-19 and never develop symptoms, isolate for at least 5 days. Day 0 is the day of your positive viral test (based on the date you were tested) and day 1 is the first full day after the specimen was collected for your positive test. You can leave isolation after 5 full days. °If you continue to have no symptoms, you can end isolation after at least 5 days. °You should continue to wear a well-fitting mask around others at home and in public until day 10 (day 6 through day 10). If you are unable to wear a mask when around others, you should continue to isolate for 10 days. Avoid people who have weakened immune systems or are more likely to get very sick from COVID-19, and nursing homes and other high-risk settings, until after at least 10 days. °If you develop symptoms after testing positive, your 5-day isolation period should start over. Day 0 is your first day of symptoms. Follow the recommendations above for ending isolation for people who had COVID-19 and had symptoms. °See additional information about travel. °Do not go to places where you are unable to wear a mask, such as restaurants and some gyms, and avoid eating around others at home and at work until 10 days after the day of your positive test. °If an individual has access to a test and wants to test, the best approach is to use an antigen test1 towards the end of the 5-day isolation period. If your test result is positive, you should continue to isolate until day 10. If your test result is positive, you can also choose to test daily and if your test result   is negative, you can end isolation, but continue to wear a well-fitting mask around others at home and in  public until day 10. Follow additional recommendations for masking and avoiding travel as described above. °1As noted in the labeling for authorized over-the counter antigen tests: Negative results should be treated as presumptive. Negative results do not rule out SARS-CoV-2 infection and should not be used as the sole basis for treatment or patient management decisions, including infection control decisions. To improve results, antigen tests should be used twice over a three-day period with at least 24 hours and no more than 48 hours between tests. °Ending isolation for people who were moderately or very sick from COVID-19 or have a weakened immune system °People who are moderately ill from COVID-19 (experiencing symptoms that affect the lungs like shortness of breath or difficulty breathing) should isolate for 10 days and follow all other isolation precautions. To calculate your 10-day isolation period, day 0 is your first day of symptoms. Day 1 is the first full day after your symptoms developed. If you are unsure if your symptoms are moderate, talk to a healthcare provider for further guidance. °People who are very sick from COVID-19 (this means people who were hospitalized or required intensive care or ventilation support) and people who have weakened immune systems might need to isolate at home longer. They may also require testing with a viral test to determine when they can be around others. CDC recommends an isolation period of at least 10 and up to 20 days for people who were very sick from COVID-19 and for people with weakened immune systems. Consult with your healthcare provider about when you can resume being around other people. If you are unsure if your symptoms are severe or if you have a weakened immune system, talk to a healthcare provider for further guidance. °People who have a weakened immune system should talk to their healthcare provider about the potential for reduced immune responses to  COVID-19 vaccines and the need to continue to follow current prevention measures (including wearing a well-fitting mask and avoiding crowds and poorly ventilated indoor spaces) to protect themselves against COVID-19 until advised otherwise by their healthcare provider. Close contacts of immunocompromised people--including household members--should also be encouraged to receive all recommended COVID-19 vaccine doses to help protect these people. °Isolation in high-risk congregate settings °In certain high-risk congregate settings that have high risk of secondary transmission and where it is not feasible to cohort people (such as correctional and detention facilities, homeless shelters, and cruise ships), CDC recommends a 10-day isolation period for residents. During periods of critical staffing shortages, facilities may consider shortening the isolation period for staff to ensure continuity of operations. Decisions to shorten isolation in these settings should be made in consultation with state, local, tribal, or territorial health departments and should take into consideration the context and characteristics of the facility. CDC's setting-specific guidance provides additional recommendations for these settings. °This CDC guidance is meant to supplement--not replace--any federal, state, local, territorial, or tribal health and safety laws, rules, and regulations. °Recommendations for specific settings °These recommendations do not apply to healthcare professionals. For guidance specific to these settings, see °Healthcare professionals: Interim Guidance for Managing Healthcare Personnel with SARS-CoV-2 Infection or Exposure to SARS-CoV-2 °Patients, residents, and visitors to healthcare settings: Interim Infection Prevention and Control Recommendations for Healthcare Personnel During the Coronavirus Disease 2019 (COVID-19) Pandemic °Additional setting-specific guidance and recommendations are available. °These  recommendations on quarantine and isolation do apply to K[employer]12 School   settings. Additional guidance is available here: Overview of COVID-19 Quarantine for K-12 Schools °Travelers: Travel information and recommendations °Congregate facilities and other settings: guidance pages for community, work, and school settings °Ongoing COVID-19 exposure FAQs °I live with someone with COVID-19, but I cannot be separated from them. How do we manage quarantine in this situation? °It is very important for people with COVID-19 to remain apart from other people, if possible, even if they are living together. If separation of the person with COVID-19 from others that they live with is not possible, the other people that they live with will have ongoing exposure, meaning they will be repeatedly exposed until that person is no longer able to spread the virus to other people. In this situation, there are precautions you can take to limit the spread of COVID-19: °The person with COVID-19 and everyone they live with should wear a well-fitting mask inside the home. °If possible, one person should care for the person with COVID-19 to limit the number of people who are in close contact with the infected person. °Take steps to protect yourself and others to reduce transmission in the home: °Quarantine if you are not up to date with your COVID-19 vaccines. °Isolate if you are sick or tested positive for COVID-19, even if you don't have symptoms. °Learn more about the public health recommendations for testing, mask use and quarantine of close contacts, like yourself, who have ongoing exposure. These recommendations differ depending on your vaccination status. °What should I do if I have ongoing exposure to COVID-19 from someone I live with? °Recommendations for this situation depend on your vaccination status: °If you are not up to date on COVID-19 vaccines and have ongoing exposure to COVID-19, you should: °Begin quarantine immediately and  continue to quarantine throughout the isolation period of the person with COVID-19. °Continue to quarantine for an additional 5 days starting the day after the end of isolation for the person with COVID-19. °Get tested at least 5 days after the end of isolation of the infected person that lives with them. °If you test negative, you can leave the home but should continue to wear a well-fitting mask when around others at home and in public until 10 days after the end of isolation for the person with COVID-19. °Isolate immediately if you develop symptoms of COVID-19 or test positive. °If you are up to date with COVID-19 vaccines and have ongoing exposure to COVID-19, you should: °Get tested at least 5 days after your first exposure. A person with COVID-19 is considered infectious starting 2 days before they develop symptoms, or 2 days before the date of their positive test if they do not have symptoms. °Get tested again at least 5 days after the end of isolation for the person with COVID-19. °Wear a well-fitting mask when you are around the person with COVID-19, and do this throughout their isolation period. °Wear a well-fitting mask around others for 10 days after the infected person's isolation period ends. °Isolate immediately if you develop symptoms of COVID-19 or test positive. °What should I do if multiple people I live with test positive for COVID-19 at different times? °Recommendations for this situation depend on your vaccination status: °If you are not up to date with your COVID-19 vaccines, you should: °Quarantine throughout the isolation period of any infected person that you live with. °Continue to quarantine until 5 days after the end of isolation date for the most recently infected person that lives with you. For example, if   the last day of isolation of the person most recently infected with COVID-19 was June 30, the new 5-day quarantine period starts on July 1. °Get tested at least 5 days after the end  of isolation for the most recently infected person that lives with you. °Wear a well-fitting mask when you are around any person with COVID-19 while that person is in isolation. °Wear a well-fitting mask when you are around other people until 10 days after your last close contact. °Isolate immediately if you develop symptoms of COVID-19 or test positive. °If you are up to date with your COVID-19 vaccines, you should: °Get tested at least 5 days after your first exposure. A person with COVID-19 is considered infectious starting 2 days before they developed symptoms, or 2 days before the date of their positive test if they do not have symptoms. °Get tested again at least 5 days after the end of isolation for the most recently infected person that lives with you. °Wear a well-fitting mask when you are around any person with COVID-19 while that person is in isolation. °Wear a well-fitting mask around others for 10 days after the end of isolation for the most recently infected person that lives with you. For example, if the last day of isolation for the person most recently infected with COVID-19 was June 30, the new 10-day period to wear a well-fitting mask indoors in public starts on July 1. °Isolate immediately if you develop symptoms of COVID-19 or test positive. °I had COVID-19 and completed isolation. Do I have to quarantine or get tested if someone I live with gets COVID-19 shortly after I completed isolation? °No. If you recently completed isolation and someone that lives with you tests positive for the virus that causes COVID-19 shortly after the end of your isolation period, you do not have to quarantine or get tested as long as you do not develop new symptoms. Once all of the people that live together have completed isolation or quarantine, refer to the guidance below for new exposures to COVID-19. °If you had COVID-19 in the previous 90 days and then came into close contact with someone with COVID-19, you do  not have to quarantine or get tested if you do not have symptoms. But you should: °Wear a well-fitting mask indoors in public for 10 days after your last close contact. °Monitor for COVID-19 symptoms for 10 days from the date of your last close contact. °Isolate immediately and get tested if symptoms develop. °If more than 90 days have passed since your recovery from infection, follow CDC's recommendations for close contacts. These recommendations will differ depending on your vaccination status. °11/17/2020 °Content source: National Center for Immunization and Respiratory Diseases (NCIRD), Division of Viral Diseases °This information is not intended to replace advice given to you by your health care provider. Make sure you discuss any questions you have with your health care provider. °Document Revised: 03/23/2021 Document Reviewed: 03/23/2021 °Elsevier Patient Education © 2022 Elsevier Inc. ° °

## 2021-07-28 NOTE — Progress Notes (Signed)
Pt states she will call back to schedule follow up appintment.

## 2021-07-28 NOTE — Progress Notes (Signed)
There were no vitals taken for this visit.   Subjective:    Patient ID: Lindsey Bautista, female    DOB: [birth year], 60 y.o.   MRN: [id number]  HPI: Lindsey Bautista is a 60 y.o. female  Chief Complaint  Patient presents with   Generalized Body Aches    Patient states her husband became symptomatic Friday. Patient states she started to have symptoms on Saturday. Patient states she did called 911 and they came out yesterday evening and checked their vitals and listened to their lungs and told them that their oxygen level was normal. Patient states they denies the ER visit as the EMT told them there was a 6-13 hour wait. Patient states they told her that if their breathing get worse to give them a call back.    Headache   Ear Pain   Fever    Patient states the highest her fever has been was 102. Patient states they have tried Tylenol and it is not helping at all.    Chills   This visit was completed via telephone due to the restrictions of the COVID-19 pandemic. All issues as above were discussed and addressed but no physical exam was performed. If it was felt that the patient should be evaluated in the office, they were directed there. The patient verbally consented to this visit. Patient was unable to complete an audio/visual visit due to Lack of equipment. Due to the catastrophic nature of the COVID-19 pandemic, this visit was done through audio contact only. Location of the patient: home Location of the provider: work Those involved with this call:  Provider: Marnee Guarneri, DNP CMA: Irena Reichmann, [place] Desk/Registration: [employer]  Time spent on call:  21 minutes on the phone discussing health concerns. 15 minutes total spent in review of patient's record and preparation of their chart.  I verified patient identity using two factors (patient name and date of birth). Patient consents verbally to being seen via telemedicine visit today.    UPPER RESPIRATORY TRACT INFECTION Her  husband had symptoms on Friday and then she started with symptoms around Saturday.  Tested positive for Covid at home on Tuesday.  Reports she is not feeling worse or better. Has had 2 Covid vaccines.   Fever:  not current -- did have at one point Cough: yes Shortness of breath: no Wheezing: no Chest pain: no Chest tightness: yes Chest congestion: yes Nasal congestion: yes Runny nose: yes Post nasal drip: yes Sneezing: no Sore throat: yes Swollen glands: no Sinus pressure: yes Headache: yes Face pain: no Toothache: no Ear pain: yes bilateral Ear pressure: yes bilateral Eyes red/itching:no Eye drainage/crusting: no  Vomiting: no Rash: no Fatigue: yes Sick contacts: yes Strep contacts: no  Context: stable Recurrent sinusitis: no Relief with OTC cold/cough medications: yes  Treatments attempted:  Tylenol     Relevant past medical, surgical, family and social history reviewed and updated as indicated. Interim medical history since our last visit reviewed. Allergies and medications reviewed and updated.  Review of Systems  Constitutional:  Positive for chills and fatigue. Negative for activity change, appetite change and fever.  HENT:  Positive for congestion, ear pain, postnasal drip, rhinorrhea, sinus pressure and sore throat. Negative for ear discharge, facial swelling, sinus pain, sneezing and voice change.   Eyes:  Negative for pain and visual disturbance.  Respiratory:  Positive for cough and chest tightness. Negative for shortness of breath and wheezing.   Cardiovascular:  Negative for chest  pain, palpitations and leg swelling.  Gastrointestinal: Negative.   Musculoskeletal:  Positive for myalgias.  Neurological:  Positive for headaches. Negative for dizziness and numbness.  Psychiatric/Behavioral: Negative.     Per HPI unless specifically indicated above     Objective:    There were no vitals taken for this visit.  Wt Readings from Last 3 Encounters:  07/13/21  147 lb (66.7 kg)  06/03/21 145 lb (65.8 kg)  05/18/21 145 lb (65.8 kg)    Physical Exam  Unable to perform due to telephone visit only.  Results for orders placed or performed in visit on 07/13/21  WET PREP FOR [place], YEAST, CLUE   Specimen: Sterile Swab   Sterile Swab  Result Value Ref Range   Trichomonas Exam Negative Negative   Yeast Exam Negative Negative   Clue Cell Exam Negative Negative  Microscopic Examination  Result Value Ref Range   WBC, UA None seen 0 - 5 /hpf   RBC 0-2 0 - 2 /hpf   Epithelial Cells (non renal) 0-10 0 - 10 /hpf  Urinalysis, Routine w reflex microscopic  Result Value Ref Range   Specific Gravity, UA 1.010 1.005 - 1.030   pH, UA 6.5 5.0 - 7.5   Color, UA Yellow Yellow   Appearance Ur Clear Clear   Leukocytes,UA Negative Negative   Protein,UA Negative Negative/Trace   Glucose, UA Negative Negative   Ketones, UA Negative Negative   RBC, UA Trace (A) Negative   Bilirubin, UA Negative Negative   Urobilinogen, Ur 0.2 0.2 - 1.0 mg/dL   Nitrite, UA Negative Negative   Microscopic Examination See below:       Assessment & Plan:   Problem List Items Addressed This Visit       Other   COVID    Symptoms starting 6 days ago, with ongoing symptoms but no red flags -- no SOB, wheezing, or CP.  At this time is past window for oral treatment.  Will send in Prednisone and Azithromycin for treatment + viscous Lidocaine to use as needed.  Recommend self quarantine for 5 days and then 5 days of masking.  Recommend: - Increased rest - Increasing Fluids - Acetaminophen / ibuprofen as needed for fever/pain.  - Salt water gargling, chloraseptic spray and throat lozenges - Mucinex.  At this time return in one week for follow-up, sooner if worsening.      Relevant Medications   azithromycin (ZITHROMAX) 250 MG tablet    I discussed the assessment and treatment plan with the patient. The patient was provided an opportunity to ask questions and all were  answered. The patient agreed with the plan and demonstrated an understanding of the instructions.   The patient was advised to call back or seek an in-person evaluation if the symptoms worsen or if the condition fails to improve as anticipated.   I provided 21+ minutes of time during this encounter.   Follow up plan: Return in about 1 week (around 08/04/2021) for Covid follow-up.

## 2021-08-03 ENCOUNTER — Telehealth: Payer: Self-pay | Admitting: Nurse Practitioner

## 2021-08-03 MED ORDER — PREDNISONE 20 MG PO TABS: 40.0000 mg | ORAL_TABLET | Freq: Every day | ORAL | 0 refills | Status: AC

## 2021-08-03 MED ORDER — AZITHROMYCIN 250 MG PO TABS: ORAL_TABLET | ORAL | 0 refills | Status: AC

## 2021-08-03 NOTE — Telephone Encounter (Signed)
Called pt to advise of provider's notes and that prescription has been sent.  Pt said thank you and has not further questions.

## 2021-08-03 NOTE — Telephone Encounter (Signed)
Copied from [place] [id number]. Topic: General - Other >> Aug 03, 2021 10:46 AM Leward Quan A wrote: Reason for CRM: Patient called in to inform Marnee Guarneri that she is not yet well and would like another round of Prednisone and Z pack since she think this may help. Please call patient at Ph#  [phone]

## 2021-08-16 DIAGNOSIS — U071 COVID-19: Secondary | ICD-10-CM | POA: Diagnosis not present

## 2021-09-06 ENCOUNTER — Other Ambulatory Visit: Payer: Self-pay | Admitting: Nurse Practitioner

## 2021-09-06 DIAGNOSIS — F419 Anxiety disorder, unspecified: Secondary | ICD-10-CM

## 2021-09-06 MED ORDER — CLONAZEPAM 1 MG PO TABS: 1.0000 mg | ORAL_TABLET | Freq: Two times a day (BID) | ORAL | 2 refills | Status: DC

## 2021-09-06 NOTE — Telephone Encounter (Signed)
Medication Refill - Medication:  clonazePAM (KLONOPIN) 1 MG tablet   Has the patient contacted their pharmacy? Yes.   Contact PCP  Preferred Pharmacy (with phone number or street name): [facility] [place] - [address]  [place], [place] ([place]) [place] [address]  Phone:  [phone]  Fax:  [phone]    Has the patient been seen for an appointment in the last year OR does the patient have an upcoming appointment? Yes.    Agent: Please be advised that RX refills may take up to 3 business days. We ask that you follow-up with your pharmacy.

## 2021-09-06 NOTE — Telephone Encounter (Signed)
Requested medication (s) are due for refill today: HAs 2 refills available  Requested medication (s) are on the active medication list: yes    Last refill: 07/13/21  #60  2 refills  Future visit scheduled yes 09/09/21  Notes to clinic:Pt should have refills available. Not delegated, please review. Thank you.  Requested Prescriptions  Pending Prescriptions Disp Refills   clonazePAM (KLONOPIN) 1 MG tablet 60 tablet 2    Sig: Take 1 tablet (1 mg total) by mouth 2 (two) times daily.     Not Delegated - Psychiatry:  Anxiolytics/Hypnotics Failed - 09/06/2021 11:28 AM      Failed - This refill cannot be delegated      Failed - Urine Drug Screen completed in last 360 days      Passed - Valid encounter within last 6 months    Recent Outpatient Visits           1 month ago [place], [place] T, NP   1 month ago Depression, recurrent ([place])   [place], Jolene T, NP   3 months ago Depression, recurrent ([place])   [place], Jolene T, NP   6 months ago Centrilobular emphysema ([place])   [place], Jolene T, NP   8 months ago Centrilobular emphysema ([place])   [place], Barbaraann Faster, NP       Future Appointments             In 3 days [name], Barbaraann Faster, NP [facility]   In 3 weeks  [facility]

## 2021-09-09 ENCOUNTER — Encounter: Payer: Self-pay | Admitting: Nurse Practitioner

## 2021-09-09 ENCOUNTER — Ambulatory Visit (INDEPENDENT_AMBULATORY_CARE_PROVIDER_SITE_OTHER): Payer: Medicare Other | Admitting: Nurse Practitioner

## 2021-09-09 DIAGNOSIS — F419 Anxiety disorder, unspecified: Secondary | ICD-10-CM

## 2021-09-09 DIAGNOSIS — F339 Major depressive disorder, recurrent, unspecified: Secondary | ICD-10-CM

## 2021-09-09 DIAGNOSIS — F1320 Sedative, hypnotic or anxiolytic dependence, uncomplicated: Secondary | ICD-10-CM

## 2021-09-09 NOTE — Assessment & Plan Note (Signed)
Refer to anxiety plan for updates and discussion.

## 2021-09-09 NOTE — Progress Notes (Signed)
There were no vitals taken for this visit.   Subjective:    Patient ID: Lindsey Bautista, female    DOB: 11/09/1960, 61 y.o.   MRN: [id number]  HPI: Lindsey Bautista is a 61 y.o. female  Chief Complaint  Patient presents with   Medication Refill    Patient states she was requesting a refill on her medication.    This visit was completed via telephone due to the restrictions of the COVID-19 pandemic. All issues as above were discussed and addressed but no physical exam was performed. If it was felt that the patient should be evaluated in the office, they were directed there. The patient verbally consented to this visit. Patient was unable to complete an audio/visual visit due to Lack of equipment. Due to the catastrophic nature of the COVID-19 pandemic, this visit was done through audio contact only. Location of the patient: home Location of the provider: work Those involved with this call:  Provider: Marnee Guarneri, DNP CMA: Irena Reichmann, [place] Desk/Registration: [employer]  Time spent on call:  21 minutes on the phone discussing health concerns. 15 minutes total spent in review of patient's record and preparation of their chart.  I verified patient identity using two factors (patient name and date of birth). Patient consents verbally to being seen via telemedicine visit today.    DEPRESSION/ANXIETY Taking Prozac 40 MG daily and Klonopin 1 MG BID.  Pt is aware of risks of benzo medication use to include increased sedation, respiratory suppression, falls, dependence and cardiovascular events.  Pt would like to continue treatment as benefit determined to outweigh risk.  On PDMP review last Klonopin fill 09/06/21 for #60.  No other controlled substances noted.  Overall mood well-controlled with this.  Mood status: stable Satisfied with current treatment?: yes Symptom severity: mild  Duration of current treatment : chronic Side effects: no Medication compliance: good  compliance Psychotherapy/counseling: none Previous psychiatric medications: Prozac and Klonopin Depressed mood: no Anxious mood: yes Anhedonia: no Significant weight loss or gain: no Insomnia: yes hard to fall asleep Fatigue: at times Feelings of worthlessness or guilt: none Impaired concentration/indecisiveness: yes Suicidal ideations: no Hopelessness: no Crying spells: no Depression screen [facility] 2/9 09/09/2021 07/13/2021 05/18/2021 02/16/2021 12/27/2020  Decreased Interest 1 1 1  0 0  Down, Depressed, Hopeless 0 0 0 0 1  PHQ - 2 Score 1 1 1  0 1  Altered sleeping 1 3 0 1 1  Tired, decreased energy 1 2 3  0 0  Change in appetite 0 2 0 0 0  Feeling bad or failure about yourself  1 1 1  0 0  Trouble concentrating 0 1 0 0 0  Moving slowly or fidgety/restless 0 1 0 0 0  Suicidal thoughts 0 0 0 0 0  PHQ-9 Score 4 11 5 1 2   Difficult doing work/chores Not difficult at all Not difficult at all Not difficult at all Not difficult at all Not difficult at all  Some recent data might be hidden    GAD 7 : Generalized Anxiety Score 09/09/2021 07/13/2021 05/18/2021 02/16/2021  Nervous, Anxious, on Edge 1 1 1 2   Control/stop worrying 1 1 1 2   Worry too much - different things 1 1 1 1   Trouble relaxing 1 1 1 1   Restless 1 1 1 1   Easily annoyed or irritable 1 2 1 1   Afraid - awful might happen 1 1 1 1   Total GAD 7 Score 7 8 7 9   Anxiety Difficulty  Not difficult at all Somewhat difficult Somewhat difficult Somewhat difficult   Relevant past medical, surgical, family and social history reviewed and updated as indicated. Interim medical history since our last visit reviewed. Allergies and medications reviewed and updated.  Review of Systems  Constitutional:  Negative for activity change, appetite change, diaphoresis, fatigue and fever.  Respiratory:  Negative for cough, chest tightness, shortness of breath and wheezing.   Cardiovascular:  Negative for chest pain, palpitations and leg swelling.   Gastrointestinal: Negative.   Endocrine: Negative for cold intolerance and heat intolerance.  Neurological: Negative.   Psychiatric/Behavioral:  Negative for decreased concentration, self-injury, sleep disturbance and suicidal ideas. The patient is nervous/anxious.    Per HPI unless specifically indicated above     Objective:    There were no vitals taken for this visit.  Wt Readings from Last 3 Encounters:  07/13/21 147 lb (66.7 kg)  06/03/21 145 lb (65.8 kg)  05/18/21 145 lb (65.8 kg)    Physical Exam  Unable to perform due to telephone visit only.  Results for orders placed or performed in visit on 07/13/21  WET PREP FOR [place], YEAST, CLUE   Specimen: Sterile Swab   Sterile Swab  Result Value Ref Range   Trichomonas Exam Negative Negative   Yeast Exam Negative Negative   Clue Cell Exam Negative Negative  Microscopic Examination  Result Value Ref Range   WBC, UA None seen 0 - 5 /hpf   RBC 0-2 0 - 2 /hpf   Epithelial Cells (non renal) 0-10 0 - 10 /hpf  Urinalysis, Routine w reflex microscopic  Result Value Ref Range   Specific Gravity, UA 1.010 1.005 - 1.030   pH, UA 6.5 5.0 - 7.5   Color, UA Yellow Yellow   Appearance Ur Clear Clear   Leukocytes,UA Negative Negative   Protein,UA Negative Negative/Trace   Glucose, UA Negative Negative   Ketones, UA Negative Negative   RBC, UA Trace (A) Negative   Bilirubin, UA Negative Negative   Urobilinogen, Ur 0.2 0.2 - 1.0 mg/dL   Nitrite, UA Negative Negative   Microscopic Examination See below:       Assessment & Plan:   Problem List Items Addressed This Visit       Other   Anxiety    Chronic, ongoing.  Continue Klonopin at this time, patient educated on risks of medication and is not interested in reducing -- refills sent in.  Continue Prozac 40 MG which benefits anxiety and fibromyalgia, discussed with her changing to Zoloft or Duloxetine if increase anxiety/depression noted.  At this time she denies SI/HI and  wishes to maintain current regimen.  UDS yearly need - next visit, contract up to date.  Return in 3 months.      Benzodiazepine dependence, continuous ([place])    Refer to anxiety plan for updates and discussion.      Depression, recurrent ([place]) - Primary    Chronic, stable.  Continue current medication regimen and adjust as needed.  Denies SI/HI.  Consider change from Prozac to Zoloft in future, which would offer benefit with anxiety and depression.  At this time she wishes not to change regimen.       I discussed the assessment and treatment plan with the patient. The patient was provided an opportunity to ask questions and all were answered. The patient agreed with the plan and demonstrated an understanding of the instructions.   The patient was advised to call back or seek an in-person evaluation  if the symptoms worsen or if the condition fails to improve as anticipated.   I provided 21+ minutes of time during this encounter.   Follow up plan: Return in about 2 weeks (around 09/23/2021) for Abdominal pain.

## 2021-09-09 NOTE — Assessment & Plan Note (Signed)
Chronic, ongoing.  Continue Klonopin at this time, patient educated on risks of medication and is not interested in reducing -- refills sent in.  Continue Prozac 40 MG which benefits anxiety and fibromyalgia, discussed with her changing to Zoloft or Duloxetine if increase anxiety/depression noted.  At this time she denies SI/HI and wishes to maintain current regimen.  UDS yearly need - next visit, contract up to date.  Return in 3 months.

## 2021-09-09 NOTE — Assessment & Plan Note (Signed)
Chronic, stable.  Continue current medication regimen and adjust as needed.  Denies SI/HI.  Consider change from Prozac to Zoloft in future, which would offer benefit with anxiety and depression.  At this time she wishes not to change regimen.

## 2021-09-09 NOTE — Patient Instructions (Signed)

## 2021-09-14 ENCOUNTER — Other Ambulatory Visit: Payer: Self-pay | Admitting: Nurse Practitioner

## 2021-09-14 ENCOUNTER — Encounter: Payer: Self-pay | Admitting: Nurse Practitioner

## 2021-09-14 ENCOUNTER — Ambulatory Visit (INDEPENDENT_AMBULATORY_CARE_PROVIDER_SITE_OTHER): Payer: Medicare Other | Admitting: Nurse Practitioner

## 2021-09-14 ENCOUNTER — Other Ambulatory Visit: Payer: Self-pay

## 2021-09-14 VITALS — BP 117/73 | HR 66 | Temp 97.6°F | Ht 61.0 in | Wt 146.2 lb

## 2021-09-14 DIAGNOSIS — F1320 Sedative, hypnotic or anxiolytic dependence, uncomplicated: Secondary | ICD-10-CM | POA: Diagnosis not present

## 2021-09-14 DIAGNOSIS — I700 Atherosclerosis of aorta: Secondary | ICD-10-CM | POA: Diagnosis not present

## 2021-09-14 DIAGNOSIS — F17210 Nicotine dependence, cigarettes, uncomplicated: Secondary | ICD-10-CM

## 2021-09-14 DIAGNOSIS — M2550 Pain in unspecified joint: Secondary | ICD-10-CM | POA: Diagnosis not present

## 2021-09-14 DIAGNOSIS — E039 Hypothyroidism, unspecified: Secondary | ICD-10-CM | POA: Diagnosis not present

## 2021-09-14 DIAGNOSIS — E538 Deficiency of other specified B group vitamins: Secondary | ICD-10-CM

## 2021-09-14 DIAGNOSIS — B9689 Other specified bacterial agents as the cause of diseases classified elsewhere: Secondary | ICD-10-CM

## 2021-09-14 DIAGNOSIS — N760 Acute vaginitis: Secondary | ICD-10-CM

## 2021-09-14 DIAGNOSIS — J432 Centrilobular emphysema: Secondary | ICD-10-CM

## 2021-09-14 DIAGNOSIS — Z79899 Other long term (current) drug therapy: Secondary | ICD-10-CM | POA: Diagnosis not present

## 2021-09-14 DIAGNOSIS — E559 Vitamin D deficiency, unspecified: Secondary | ICD-10-CM

## 2021-09-14 DIAGNOSIS — R399 Unspecified symptoms and signs involving the genitourinary system: Secondary | ICD-10-CM | POA: Diagnosis not present

## 2021-09-14 LAB — WET PREP FOR TRICH, YEAST, CLUE
Clue Cell Exam: POSITIVE — AB
Trichomonas Exam: NEGATIVE
Yeast Exam: NEGATIVE

## 2021-09-14 LAB — URINALYSIS, ROUTINE W REFLEX MICROSCOPIC
Bilirubin, UA: NEGATIVE
Glucose, UA: NEGATIVE
Leukocytes,UA: NEGATIVE
Nitrite, UA: NEGATIVE
Protein,UA: NEGATIVE
RBC, UA: NEGATIVE
Specific Gravity, UA: 1.025 (ref 1.005–1.030)
Urobilinogen, Ur: 1 mg/dL (ref 0.2–1.0)
pH, UA: 7 (ref 5.0–7.5)

## 2021-09-14 MED ORDER — MELOXICAM 7.5 MG PO TABS: 7.5000 mg | ORAL_TABLET | Freq: Every day | ORAL | 0 refills | Status: DC

## 2021-09-14 MED ORDER — CLINDAMYCIN PHOSPHATE 2 % VA CREA: 1.0000 | TOPICAL_CREAM | Freq: Every day | VAGINAL | 0 refills | Status: DC

## 2021-09-14 NOTE — Assessment & Plan Note (Signed)
Chronic, ongoing, noted on October 2019 lung screening CT.  Continue statin and ASA daily + recommend complete cessation of smoking. 

## 2021-09-14 NOTE — Patient Instructions (Signed)
START TAKING GAS-EX AS NEEDED + TAKE PROBIOTIC GUMMY OR TABLET DAILY.  Bacterial Vaginosis Bacterial vaginosis is an infection of the vagina. It happens when too many normal germs (healthy bacteria) grow in the vagina. This infection can make it easier to get other infections from sex (STIs). It is very important for pregnant women to get treated. This infection can cause babies to be born early or at a low birth weight. What are the causes? This infection is caused by an increase in certain germs that grow in the vagina. You cannot get this infection from toilet seats, bedsheets, swimming pools, or things that touch your vagina. What increases the risk? Having sex with a new person or more than one person. Having sex without protection. Douching. Having an intrauterine device (IUD). Smoking. Using drugs or drinking alcohol. These can lead you to do things that are risky. Taking certain antibiotic medicines. Being pregnant. What are the signs or symptoms? Some women have no symptoms. Symptoms may include: A discharge from your vagina. It may be gray or white. It can be watery or foamy. A fishy smell. This can happen after sex or during your menstrual period. Itching in and around your vagina. A feeling of burning or pain when you pee (urinate). How is this treated? This infection is treated with antibiotic medicines. These may be given to you as: A pill. A cream for your vagina. A medicine that you put into your vagina (suppository). If the infection comes back after treatment, you may need more antibiotics. Follow these instructions at home: Medicines Take over-the-counter and prescription medicines as told by your doctor. Take or use your antibiotic medicine as told by your doctor. Do not stop taking or using it, even if you start to feel better. General instructions If the person you have sex with is a woman, tell her that you have this infection. She will need to follow up with  her doctor. If you have a female partner, he does not need to be treated. Do not have sex until you finish treatment. Drink enough fluid to keep your pee pale yellow. Keep your vagina and butt clean. Wash the area with warm water each day. Wipe from front to back after you use the toilet. If you are breastfeeding a baby, ask your doctor if you should keep doing so during treatment. Keep all follow-up visits. How is this prevented? Self-care Do not douche. Use only warm water to wash around your vagina. Wear underwear that is cotton or lined with cotton. Do not wear tight pants and pantyhose, especially in the summer. Safe sex Use protection when you have sex. This includes: Use condoms. Use dental dams. This is a thin layer that protects the mouth during oral sex. Limit how many people you have sex with. To prevent this infection, it is best to have sex with just one person. Get tested for STIs. The person you have sex with should also get tested. Drugs and alcohol Do not smoke or use any products that contain nicotine or tobacco. If you need help quitting, ask your doctor. Do not use drugs. Do not drink alcohol if: Your doctor tells you not to drink. You are pregnant, may be pregnant, or are planning to become pregnant. If you drink alcohol: Limit how much you have to 0-1 drink a day. Know how much alcohol is in your drink. In the [place] one drink equals one 12 oz bottle of beer (355 mL), one 5 oz glass of  wine (148 mL), or one 1 oz glass of hard liquor (44 mL). Where to find more information Centers for Disease Control and Prevention: http://www.wolf.info/ American Sexual Health Association: www.ashastd.org Office on [employer]: [place] Contact a doctor if: Your symptoms do not get better, even after you are treated. You have more discharge or pain when you pee. You have a fever or chills. You have pain in your belly (abdomen) or in the area between your hips. You have  pain with sex. You bleed from your vagina between menstrual periods. Summary This infection can happen when too many germs (bacteria) grow in the vagina. This infection can make it easier to get infections from sex (STIs). Treating this can lower that chance. Get treated if you are pregnant. This infection can cause babies to be born early. Do not stop taking or using your antibiotic medicine, even if you start to feel better. This information is not intended to replace advice given to you by your health care provider. Make sure you discuss any questions you have with your health care provider. Document Revised: 02/05/2020 Document Reviewed: 02/05/2020 Elsevier Patient Education  [place].

## 2021-09-14 NOTE — Assessment & Plan Note (Addendum)
Refer to anxiety plan for updates and discussion. UDS today.

## 2021-09-14 NOTE — Assessment & Plan Note (Signed)
Has fibromyalgia on problem list, but reports never being told this.  Is sensitive to touch, but refuses a change from Prozac to Cymbalta.  Could consider addition of Gabapentin at night in future.  Check labs today and consider ortho referral or rheumatology if ongoing.

## 2021-09-14 NOTE — Progress Notes (Addendum)
BP 117/73    Pulse 66    Temp 97.6 F (36.4 C) (Oral)    Ht $R'5\' 1"'cF$  (1.549 m)    Wt 146 lb 3.2 oz (66.3 kg)    SpO2 96%    BMI 27.62 kg/m    Subjective:    Patient ID: Lindsey Bautista, female    DOB: [birth year], 61 y.o.   MRN: [id number]  HPI: Lindsey Bautista is a 61 y.o. female  Chief Complaint  Patient presents with   Urinary Tract Infection    Patient is here for urinary issues.Patient states her side and lower abdomen is hurting. Patient states she feels like a drop in her vaginal area. Patient states she has a lot of pressure in her vaginal area.    URINARY SYMPTOMS Started having urinary symptoms over the weekend, feels like she is sitting on something in the vagina -- history of complete hysterectomy 30 years ago.  Last treated for BV 09/29/20, had history of of recurrent infections. Dysuria: no Urinary frequency: yes Urgency: no Small volume voids: no Symptom severity: no Urinary incontinence: no Foul odor: no Hematuria: no Abdominal pain: yes Back pain: no Suprapubic pain/pressure: yes Flank pain: no Fever:  no Vomiting: no Status: stable Previous urinary tract infection: yes Recurrent urinary tract infection: no Sexual activity: No sexually active History of sexually transmitted disease: no Treatments attempted: increasing fluids  ARTHRALGIAS / JOINT ACHES States her joints hurt all the time.  Legs and arms always hurting.  Recently had imaging of right hip that showed arthritic changes and lower spine.  Currently has Tizanidine Duration: months Pain: yes Symmetric: yes  8/10 Quality: dull, aching, and throbbing Frequency: intermittent Context:  fluctuating Decreased function/range of motion: none Erythema: none Swelling: at times Heat or warmth: none Morning stiffness: yes Aggravating factors: movement Alleviating factors: nothing Relief with NSAIDs?: no Treatments attempted:  rest, APAP, and ibuprofen  Involved Joints:     Hands: yes bilateral     Wrists: yes bilateral     Elbows: yes bilateral    Shoulders: yes bilateral    Back: yes     Hips: yes bilateral    Knees: yes bilateral    Ankles: no none    Feet: none  COPD Noted on lung screening = centrilobular emphysema and aortic atherosclerosis -- last screening in October 2021. No current inhalers.  Currently smoking 1 PPD, has smoked for >40 years. Not interested in quitting.   COPD status: stable Satisfied with current treatment?: yes Oxygen use: no Dyspnea frequency:  Cough frequency:  Rescue inhaler frequency:   Limitation of activity: no Productive cough:  Last Spirometry: none recent Pneumovax:  refused Influenza:  refused    Relevant past medical, surgical, family and social history reviewed and updated as indicated. Interim medical history since our last visit reviewed. Allergies and medications reviewed and updated.  Review of Systems  Constitutional:  Negative for activity change, appetite change, diaphoresis, fatigue and fever.  Respiratory:  Negative for cough, chest tightness, shortness of breath and wheezing.   Cardiovascular:  Negative for chest pain, palpitations and leg swelling.  Gastrointestinal:  Positive for abdominal pain and nausea. Negative for abdominal distention, constipation, diarrhea and vomiting.  Endocrine: Negative for cold intolerance and heat intolerance.  Musculoskeletal:  Positive for arthralgias.  Neurological: Negative.   Psychiatric/Behavioral:  Negative for decreased concentration, self-injury, sleep disturbance and suicidal ideas. The patient is nervous/anxious.    Per HPI unless specifically indicated above  Objective:    BP 117/73    Pulse 66    Temp 97.6 F (36.4 C) (Oral)    Ht $R'5\' 1"'bj$  (1.549 m)    Wt 146 lb 3.2 oz (66.3 kg)    SpO2 96%    BMI 27.62 kg/m   Wt Readings from Last 3 Encounters:  09/14/21 146 lb 3.2 oz (66.3 kg)  07/13/21 147 lb (66.7 kg)  06/03/21 145 lb (65.8 kg)    Physical Exam Vitals and nursing  note reviewed.  Constitutional:      General: She is awake. She is not in acute distress.    Appearance: She is well-developed, well-groomed and overweight. She is not ill-appearing.  HENT:     Head: Normocephalic.     Right Ear: Hearing normal.     Left Ear: Hearing normal.  Eyes:     General: Lids are normal.        Right eye: No discharge.        Left eye: No discharge.     Conjunctiva/sclera: Conjunctivae normal.     Pupils: Pupils are equal, round, and reactive to light.  Neck:     Thyroid: No thyromegaly.     Vascular: No carotid bruit.  Cardiovascular:     Rate and Rhythm: Normal rate and regular rhythm.     Heart sounds: Normal heart sounds. No murmur heard.   No gallop.  Pulmonary:     Effort: Pulmonary effort is normal. No accessory muscle usage or respiratory distress.     Breath sounds: Normal breath sounds.  Abdominal:     General: Bowel sounds are normal. There is no distension.     Palpations: Abdomen is soft.     Tenderness: There is generalized abdominal tenderness. There is no right CVA tenderness, left CVA tenderness, guarding or rebound.     Hernia: No hernia is present.  Musculoskeletal:     Cervical back: Normal range of motion and neck supple.     Right lower leg: No edema.     Left lower leg: No edema.  Skin:    General: Skin is warm and dry.  Neurological:     Mental Status: She is alert and oriented to person, place, and time.  Psychiatric:        Attention and Perception: Attention normal.        Mood and Affect: Mood normal.        Behavior: Behavior normal. Behavior is cooperative.        Thought Content: Thought content normal.        Judgment: Judgment normal.    Results for orders placed or performed in visit on 07/13/21  WET PREP FOR [place], YEAST, CLUE   Specimen: Sterile Swab   Sterile Swab  Result Value Ref Range   Trichomonas Exam Negative Negative   Yeast Exam Negative Negative   Clue Cell Exam Negative Negative  Microscopic  Examination  Result Value Ref Range   WBC, UA None seen 0 - 5 /hpf   RBC 0-2 0 - 2 /hpf   Epithelial Cells (non renal) 0-10 0 - 10 /hpf  Urinalysis, Routine w reflex microscopic  Result Value Ref Range   Specific Gravity, UA 1.010 1.005 - 1.030   pH, UA 6.5 5.0 - 7.5   Color, UA Yellow Yellow   Appearance Ur Clear Clear   Leukocytes,UA Negative Negative   Protein,UA Negative Negative/Trace   Glucose, UA Negative Negative   Ketones, UA Negative Negative  RBC, UA Trace (A) Negative   Bilirubin, UA Negative Negative   Urobilinogen, Ur 0.2 0.2 - 1.0 mg/dL   Nitrite, UA Negative Negative   Microscopic Examination See below:       Assessment & Plan:   Problem List Items Addressed This Visit       Cardiovascular and Mediastinum   Aortic atherosclerosis ([place])    Chronic, ongoing, noted on October 2019 lung screening CT.  Continue statin and ASA daily + recommend complete cessation of smoking.        Respiratory   Centrilobular emphysema ([facility]) - Primary    Chronic, ongoing.  No current inhaler regimen.  Denies any symptoms.  Continue yearly lung screening and plan on spirometry next visit to assess function.  Recommend complete cessation of smoking.  CBC and TSH today.        Genitourinary   Bacterial vaginosis    Acute, wet prep noting clue cells.  UA negative, with exception trace ketone.  Allergic to Flagyl, will send script for Clindamycin vaginal 2% cream which was beneficial last time.  She is aware to alert provider if any issues.  Return for worsening or ongoing issues.  Continue vaginal estrace for atrophy.        Relevant Orders   Urinalysis, Routine w reflex microscopic   WET PREP FOR TRICH, YEAST, CLUE     Other   Benzodiazepine dependence, continuous ([place])    Refer to anxiety plan for updates and discussion. UDS today.      Relevant Orders   [id number] 11+Oxyco+Alc+Crt-Bund   Multiple joint pain    Has fibromyalgia on problem list, but reports never being told  this.  Is sensitive to touch, but refuses a change from Prozac to Cymbalta.  Could consider addition of Gabapentin at night in future.  Check labs today and consider ortho referral or rheumatology if ongoing.      Relevant Orders   CBC with Differential/Platelet   Comprehensive metabolic panel   TSH   VITAMIN D 25 Hydroxy (Vit-D Deficiency, Fractures)   Vitamin B12   Uric acid   ANA w/Reflex if Positive   Sed Rate (ESR)   C-reactive protein   Nicotine dependence, cigarettes, uncomplicated    I have recommended complete cessation of tobacco use. I have discussed various options available for assistance with tobacco cessation including over the counter methods (Nicotine gum, patch and lozenges). We also discussed prescription options (Chantix, Nicotine Inhaler / Nasal Spray). The patient is not interested in pursuing any prescription tobacco cessation options at this time.       Other Visit Diagnoses     High risk medication use       UDS today.   Relevant Orders   [id number] 11+Oxyco+Alc+Crt-Bund   B12 deficiency       History of low levels reported, check today and add supplement as needed.   Relevant Orders   Vitamin B12   Vitamin D deficiency       History of low levels reported, check today and add supplement as needed.   Relevant Orders   VITAMIN D 25 Hydroxy (Vit-D Deficiency, Fractures)   Hypothyroidism, unspecified type       TSH check today.   Relevant Orders   TSH        Follow up plan: Return in about 4 weeks (around 10/12/2021) for joint pain, HTN/HLD, COPD.

## 2021-09-14 NOTE — Assessment & Plan Note (Signed)
I have recommended complete cessation of tobacco use. I have discussed various options available for assistance with tobacco cessation including over the counter methods (Nicotine gum, patch and lozenges). We also discussed prescription options (Chantix, Nicotine Inhaler / Nasal Spray). The patient is not interested in pursuing any prescription tobacco cessation options at this time.  

## 2021-09-14 NOTE — Assessment & Plan Note (Signed)
Chronic, ongoing.  No current inhaler regimen.  Denies any symptoms.  Continue yearly lung screening and plan on spirometry next visit to assess function.  Recommend complete cessation of smoking.  CBC and TSH today.

## 2021-09-14 NOTE — Assessment & Plan Note (Signed)
Acute, wet prep noting clue cells.  UA negative, with exception trace ketone.  Allergic to Flagyl, will send script for Clindamycin vaginal 2% cream which was beneficial last time.  She is aware to alert provider if any issues.  Return for worsening or ongoing issues.  Continue vaginal estrace for atrophy.

## 2021-09-15 ENCOUNTER — Encounter: Payer: Self-pay | Admitting: Nurse Practitioner

## 2021-09-15 DIAGNOSIS — D518 Other vitamin B12 deficiency anemias: Secondary | ICD-10-CM | POA: Insufficient documentation

## 2021-09-15 DIAGNOSIS — E559 Vitamin D deficiency, unspecified: Secondary | ICD-10-CM | POA: Insufficient documentation

## 2021-09-15 DIAGNOSIS — E538 Deficiency of other specified B group vitamins: Secondary | ICD-10-CM | POA: Insufficient documentation

## 2021-09-15 LAB — CBC WITH DIFFERENTIAL/PLATELET
Basophils Absolute: 0.1 10*3/uL (ref 0.0–0.2)
Basos: 1 %
EOS (ABSOLUTE): 0.1 10*3/uL (ref 0.0–0.4)
Eos: 1 %
Hematocrit: 39.5 % (ref 34.0–46.6)
Hemoglobin: 13.3 g/dL (ref 11.1–15.9)
Immature Grans (Abs): 0 10*3/uL (ref 0.0–0.1)
Immature Granulocytes: 0 %
Lymphocytes Absolute: 2.2 10*3/uL (ref 0.7–3.1)
Lymphs: 41 %
MCH: 30.6 pg (ref 26.6–33.0)
MCHC: 33.7 g/dL (ref 31.5–35.7)
MCV: 91 fL (ref 79–97)
Monocytes Absolute: 0.5 10*3/uL (ref 0.1–0.9)
Monocytes: 9 %
Neutrophils Absolute: 2.6 10*3/uL (ref 1.4–7.0)
Neutrophils: 48 %
Platelets: 250 10*3/uL (ref 150–450)
RBC: 4.34 x10E6/uL (ref 3.77–5.28)
RDW: 12.3 % (ref 11.7–15.4)
WBC: 5.4 10*3/uL (ref 3.4–10.8)

## 2021-09-15 LAB — COMPREHENSIVE METABOLIC PANEL
ALT: 14 IU/L (ref 0–32)
AST: 22 IU/L (ref 0–40)
Albumin/Globulin Ratio: 2 (ref 1.2–2.2)
Albumin: 4.5 g/dL (ref 3.8–4.9)
Alkaline Phosphatase: 91 IU/L (ref 44–121)
BUN/Creatinine Ratio: 14 (ref 12–28)
BUN: 12 mg/dL (ref 8–27)
Bilirubin Total: 0.6 mg/dL (ref 0.0–1.2)
CO2: 25 mmol/L (ref 20–29)
Calcium: 9.1 mg/dL (ref 8.7–10.3)
Chloride: 104 mmol/L (ref 96–106)
Creatinine, Ser: 0.83 mg/dL (ref 0.57–1.00)
Globulin, Total: 2.2 g/dL (ref 1.5–4.5)
Glucose: 70 mg/dL (ref 70–99)
Potassium: 4.8 mmol/L (ref 3.5–5.2)
Sodium: 143 mmol/L (ref 134–144)
Total Protein: 6.7 g/dL (ref 6.0–8.5)
eGFR: 81 mL/min/{1.73_m2} (ref >59)

## 2021-09-15 LAB — URIC ACID: Uric Acid: 3.4 mg/dL (ref 3.0–7.2)

## 2021-09-15 LAB — TSH: TSH: 1.3 u[IU]/mL (ref 0.450–4.500)

## 2021-09-15 LAB — VITAMIN B12: Vitamin B-12: 250 pg/mL (ref 232–1245)

## 2021-09-15 LAB — C-REACTIVE PROTEIN: CRP: <1 mg/L (ref 0–10)

## 2021-09-15 LAB — ANA W/REFLEX IF POSITIVE: Anti Nuclear Antibody (ANA): NEGATIVE

## 2021-09-15 LAB — VITAMIN D 25 HYDROXY (VIT D DEFICIENCY, FRACTURES): Vit D, 25-Hydroxy: 17.5 ng/mL — ABNORMAL LOW (ref 30.0–100.0)

## 2021-09-15 LAB — SEDIMENTATION RATE: Sed Rate: 7 mm/hr (ref 0–40)

## 2021-09-15 NOTE — Progress Notes (Signed)
Good morning crew, please let Lindsey Bautista know her labs have returned and overall everything is stable with a couple exceptions.  Her B12 level is on lower side.  I would like her to start taking Vitamin B12 1000 MCG daily which she can obtain in Vitamin section, this is good for overall nervous system health and memory.  Vitamin D is also on lower side, I want her to start taking Vitamin D3 2000 units daily as this is good for bone and muscle health.  We will recheck next visit.  Any questions?  Her inflammatory and rheumatoid labs are all normal or negative. Keep being awesome!!  Thank you for allowing me to participate in your care.  I appreciate you. Kindest regards, [facility]

## 2021-09-18 LAB — DRUG SCREEN 764883 11+OXYCO+ALC+CRT-BUND
Amphetamines, Urine: NEGATIVE ng/mL
Barbiturate: NEGATIVE ng/mL
Cannabinoid Quant, Ur: NEGATIVE ng/mL
Cocaine (Metabolite): NEGATIVE ng/mL
Creatinine: 133.1 mg/dL (ref 20.0–300.0)
Ethanol: NEGATIVE %
Meperidine: NEGATIVE ng/mL
Methadone Screen, Urine: NEGATIVE ng/mL
OPIATE SCREEN URINE: NEGATIVE ng/mL
Oxycodone/Oxymorphone, Urine: NEGATIVE ng/mL
Phencyclidine: NEGATIVE ng/mL
Propoxyphene: NEGATIVE ng/mL
Tramadol: NEGATIVE ng/mL
pH, Urine: 7 (ref 4.5–8.9)

## 2021-09-18 LAB — BENZODIAZEPINES CONFIRM, URINE
Alprazolam: NEGATIVE
Benzodiazepines: POSITIVE ng/mL — AB
Clonazepam Conf.: 1112 ng/mL
Clonazepam: POSITIVE — AB
Flurazepam: NEGATIVE
Lorazepam: NEGATIVE
Midazolam: NEGATIVE
Nordiazepam: NEGATIVE
Oxazepam: NEGATIVE
Temazepam: NEGATIVE
Triazolam: NEGATIVE

## 2021-09-30 ENCOUNTER — Ambulatory Visit: Payer: Medicare Other

## 2021-10-06 ENCOUNTER — Ambulatory Visit (INDEPENDENT_AMBULATORY_CARE_PROVIDER_SITE_OTHER): Payer: Medicare Other | Admitting: *Deleted

## 2021-10-06 DIAGNOSIS — Z0000 Encounter for general adult medical examination without abnormal findings: Secondary | ICD-10-CM

## 2021-10-06 NOTE — Patient Instructions (Signed)
Lindsey Bautista , Thank you for taking time to come for your Medicare Wellness Visit. I appreciate your ongoing commitment to your health goals. Please review the following plan we discussed and let me know if I can assist you in the future.   Screening recommendations/referrals: Colonoscopy: up to date Mammogram: up to date  Recommended yearly ophthalmology/optometry visit for glaucoma screening and checkup Recommended yearly dental visit for hygiene and checkup  Vaccinations: Influenza vaccine: Education provided  Tdap vaccine: Education provided Shingles vaccine: Education provided    Advanced directives: Education provided  Conditions/risks identified:   Next appointment: 10-12-2021 @ 1:00 Lindsey Bautista   Preventive Care 40-64 Years and Older, Female Preventive care refers to lifestyle choices and visits with your health care provider that can promote health and wellness. What does preventive care include? A yearly physical exam. This is also called an annual well check. Dental exams once or twice a year. Routine eye exams. Ask your health care provider how often you should have your eyes checked. Personal lifestyle choices, including: Daily care of your teeth and gums. Regular physical activity. Eating a healthy diet. Avoiding tobacco and drug use. Limiting alcohol use. Practicing safe sex. Taking low-dose aspirin every day. Taking vitamin and mineral supplements as recommended by your health care provider. What happens during an annual well check? The services and screenings done by your health care provider during your annual well check will depend on your age, overall health, lifestyle risk factors, and family history of disease. Counseling  Your health care provider may ask you questions about your: Alcohol use. Tobacco use. Drug use. Emotional well-being. Home and relationship well-being. Sexual activity. Eating habits. History of falls. Memory and ability to  understand (cognition). Work and work Statistician. Reproductive health. Screening  You may have the following tests or measurements: Height, weight, and BMI. Blood pressure. Lipid and cholesterol levels. These may be checked every 5 years, or more frequently if you are over 3 years old. Skin check. Lung cancer screening. You may have this screening every year starting at age 43 if you have a 30-pack-year history of smoking and currently smoke or have quit within the past 15 years. Fecal occult blood test (FOBT) of the stool. You may have this test every year starting at age 79. Flexible sigmoidoscopy or colonoscopy. You may have a sigmoidoscopy every 5 years or a colonoscopy every 10 years starting at age 39. Hepatitis C blood test. Hepatitis B blood test. Sexually transmitted disease (STD) testing. Diabetes screening. This is done by checking your blood sugar (glucose) after you have not eaten for a while (fasting). You may have this done every 1-3 years. Bone density scan. This is done to screen for osteoporosis. You may have this done starting at age 39. Mammogram. This may be done every 1-2 years. Talk to your health care provider about how often you should have regular mammograms. Talk with your health care provider about your test results, treatment options, and if necessary, the need for more tests. Vaccines  Your health care provider may recommend certain vaccines, such as: Influenza vaccine. This is recommended every year. Tetanus, diphtheria, and acellular pertussis (Tdap, Td) vaccine. You may need a Td booster every 10 years. Zoster vaccine. You may need this after age 73. Pneumococcal 13-valent conjugate (PCV13) vaccine. One dose is recommended after age 72. Pneumococcal polysaccharide (PPSV23) vaccine. One dose is recommended after age 39. Talk to your health care provider about which screenings and vaccines you need and how often  you need them. This information is not  intended to replace advice given to you by your health care provider. Make sure you discuss any questions you have with your health care provider. Document Released: 09/03/2015 Document Revised: 04/26/2016 Document Reviewed: 06/08/2015 Elsevier Interactive Patient Education  2017 [place] Prevention in the Home Falls can cause injuries. They can happen to people of all ages. There are many things you can do to make your home safe and to help prevent falls. What can I do on the outside of my home? Regularly fix the edges of walkways and driveways and fix any cracks. Remove anything that might make you trip as you walk through a door, such as a raised step or threshold. Trim any bushes or trees on the path to your home. Use bright outdoor lighting. Clear any walking paths of anything that might make someone trip, such as rocks or tools. Regularly check to see if handrails are loose or broken. Make sure that both sides of any steps have handrails. Any raised decks and porches should have guardrails on the edges. Have any leaves, snow, or ice cleared regularly. Use sand or salt on walking paths during winter. Clean up any spills in your garage right away. This includes oil or grease spills. What can I do in the bathroom? Use night lights. Install grab bars by the toilet and in the tub and shower. Do not use towel bars as grab bars. Use non-skid mats or decals in the tub or shower. If you need to sit down in the shower, use a plastic, non-slip stool. Keep the floor dry. Clean up any water that spills on the floor as soon as it happens. Remove soap buildup in the tub or shower regularly. Attach bath mats securely with double-sided non-slip rug tape. Do not have throw rugs and other things on the floor that can make you trip. What can I do in the bedroom? Use night lights. Make sure that you have a light by your bed that is easy to reach. Do not use any sheets or blankets that are  too big for your bed. They should not hang down onto the floor. Have a firm chair that has side arms. You can use this for support while you get dressed. Do not have throw rugs and other things on the floor that can make you trip. What can I do in the kitchen? Clean up any spills right away. Avoid walking on wet floors. Keep items that you use a lot in easy-to-reach places. If you need to reach something above you, use a strong step stool that has a grab bar. Keep electrical cords out of the way. Do not use floor polish or wax that makes floors slippery. If you must use wax, use non-skid floor wax. Do not have throw rugs and other things on the floor that can make you trip. What can I do with my stairs? Do not leave any items on the stairs. Make sure that there are handrails on both sides of the stairs and use them. Fix handrails that are broken or loose. Make sure that handrails are as long as the stairways. Check any carpeting to make sure that it is firmly attached to the stairs. Fix any carpet that is loose or worn. Avoid having throw rugs at the top or bottom of the stairs. If you do have throw rugs, attach them to the floor with carpet tape. Make sure that you have a light  switch at the top of the stairs and the bottom of the stairs. If you do not have them, ask someone to add them for you. What else can I do to help prevent falls? Wear shoes that: Do not have high heels. Have rubber bottoms. Are comfortable and fit you well. Are closed at the toe. Do not wear sandals. If you use a stepladder: Make sure that it is fully opened. Do not climb a closed stepladder. Make sure that both sides of the stepladder are locked into place. Ask someone to hold it for you, if possible. Clearly mark and make sure that you can see: Any grab bars or handrails. First and last steps. Where the edge of each step is. Use tools that help you move around (mobility aids) if they are needed. These  include: Canes. Walkers. Scooters. Crutches. Turn on the lights when you go into a dark area. Replace any light bulbs as soon as they burn out. Set up your furniture so you have a clear path. Avoid moving your furniture around. If any of your floors are uneven, fix them. If there are any pets around you, be aware of where they are. Review your medicines with your doctor. Some medicines can make you feel dizzy. This can increase your chance of falling. Ask your doctor what other things that you can do to help prevent falls. This information is not intended to replace advice given to you by your health care provider. Make sure you discuss any questions you have with your health care provider. Document Released: 06/03/2009 Document Revised: 01/13/2016 Document Reviewed: 09/11/2014 Elsevier Interactive Patient Education  2017 [employer].

## 2021-10-06 NOTE — Progress Notes (Signed)
Subjective:   Lindsey Bautista is a 61 y.o. female who presents for Medicare Annual (Subsequent) preventive examination.  I connected with  YUVONNE LANAHAN on 10/06/21 by a telephone enabled telemedicine application and verified that I am speaking with the correct person using two identifiers.   I discussed the limitations of evaluation and management by telemedicine. The patient expressed understanding and agreed to proceed.  Patient location: home  Provider location: Tele-Health not in clinic     Review of Systems     Cardiac Risk Factors include: advanced age (>66mn, >>44women);smoking/ tobacco exposure;family history of premature cardiovascular disease     Objective:    Today's Vitals   10/06/21 1206  PainSc: 3    There is no height or weight on file to calculate BMI.  Advanced Directives 10/06/2021 09/27/2020 12/17/2019 11/22/2019 11/20/2019 06/06/2019 05/25/2019  Does Patient Have a Medical Advance Directive? _0  No No  Does patient want to make changes to medical advance directive? - - - - - - -  Would patient like information on creating a medical advance directive? No - Patient declined - No - Patient declined No - Patient declined - - No - Patient declined    Current Medications (verified) Outpatient Encounter Medications as of 10/06/2021  Medication Sig   aspirin 81 MG tablet Take 81 mg by mouth daily.   atorvastatin (LIPITOR) 80 MG tablet Take 1 tablet (80 mg total) by mouth daily.   BINAXNOW COVID-19 AG HOME TEST KIT Use as Directed on the Package   clindamycin (CLEOCIN) 2 % vaginal cream Place 1 Applicatorful vaginally at bedtime. For 7 days.   clonazePAM (KLONOPIN) 1 MG tablet Take 1 tablet (1 mg total) by mouth 2 (two) times daily.   docusate sodium (COLACE) 100 MG capsule Take 100 mg by mouth daily.    EPINEPHrine 0.3 mg/0.3 mL IJ SOAJ injection Inject 0.3 mg into the muscle as needed for anaphylaxis.   estradiol (ESTRACE) 0.5 MG tablet Take 1 tablet (0.5  mg total) by mouth daily.   ezetimibe (ZETIA) 10 MG tablet Take 1 tablet (10 mg total) by mouth daily.   FLUoxetine (PROZAC) 40 MG capsule Take 1 capsule (40 mg total) by mouth daily.   lidocaine (XYLOCAINE) 2 % solution Use as directed 15 mLs in the mouth or throat as needed for mouth pain.   linaclotide (LINZESS) 72 MCG capsule TAKE 1 CAPSULE BY MOUTH ONCE DAILY BEFORE BREAKFAST   nitroGLYCERIN (NITROSTAT) 0.4 MG SL tablet Place 1 tablet (0.4 mg total) under the tongue every 5 (five) minutes as needed for chest pain.   ondansetron (ZOFRAN) 4 MG tablet Take 1 tablet (4 mg total) by mouth every 8 (eight) hours as needed for nausea or vomiting.   tiZANidine (ZANAFLEX) 4 MG tablet Take 1 tablet (4 mg total) by mouth every 6 (six) hours as needed for muscle spasms.   meloxicam (MOBIC) 7.5 MG tablet Take 1 tablet (7.5 mg total) by mouth daily. (Patient not taking: Reported on 10/06/2021)   No facility-administered encounter medications on file as of 10/06/2021.    Allergies (verified) Flagyl [metronidazole], Penicillin g, Iodinated contrast media, and Flomax [tamsulosin hcl]   History: Past Medical History:  Diagnosis Date   Atypical chest pain    a. 08/2016 Neg MV.   COPD (chronic obstructive pulmonary disease) (HCC)    Coronary artery disease, non-occlusive    a.  LHC 06/19/2018: Mid LAD 50% with iFR 0.94  Depression    Diastolic dysfunction    a. 10/2016 Echo: EF 60-65%, no rwma, Gr1 DD, mild MR, nl LA size, nl RV fxn; b. 05/2018 Echo: EF 60-65%. No rwma. Mild MR. Nl RV fxn.   GERD (gastroesophageal reflux disease)    Hypotension    IBS (irritable bowel syndrome)    Right arm weakness    Situational syncope    Stroke [facility])    TIA (transient ischemic attack)    Past Surgical History:  Procedure Laterality Date   COLONOSCOPY WITH PROPOFOL N/A 11/22/2015   Procedure: COLONOSCOPY WITH PROPOFOL;  Surgeon: Manya Silvas, MD;  Location: [facility] ENDOSCOPY;  Service: Endoscopy;  Laterality:  N/A;   ESOPHAGOGASTRODUODENOSCOPY (EGD) WITH PROPOFOL N/A 11/22/2015   Procedure: ESOPHAGOGASTRODUODENOSCOPY (EGD) WITH PROPOFOL;  Surgeon: Manya Silvas, MD;  Location[facility] ENDOSCOPY;  Service: Endoscopy;  Laterality: N/A;   INTRAVASCULAR PRESSURE WIRE/FFR STUDY N/A 06/19/2018   Procedure: INTRAVASCULAR PRESSURE WIRE/FFR STUDY;  Surgeon: Nelva Bush, MD;  Location: [place] CV LAB;  Service: Cardiovascular;  Laterality: N/A;   LEFT HEART CATH AND CORONARY ANGIOGRAPHY N/A 06/19/2018   Procedure: LEFT HEART CATH AND CORONARY ANGIOGRAPHY;  Surgeon: Nelva Bush, MD;  Location: [place] CV LAB;  Service: Cardiovascular;  Laterality: N/A;   OOPHORECTOMY     TONSILLECTOMY     VAGINAL HYSTERECTOMY     Family History  Problem Relation Age of Onset   Bladder Cancer Mother    Heart failure Mother    Renal Disease Mother    COPD Father    Heart disease Other    COPD Other    Stroke Other    Breast cancer Paternal Aunt 61   Colon cancer Brother    Social History   Socioeconomic History   Marital status: Married    Spouse name: Jenetta Downer Lahman   Number of children: 2   Years of education: 12   Highest education level: High school graduate  Occupational History   Occupation: Unemployed  Tobacco Use   Smoking status: Every Day    Packs/day: 0.50    Years: 39.00    Pack years: 19.50    Types: Cigarettes   Smokeless tobacco: Never  Vaping Use   Vaping Use: Never used  Substance and Sexual Activity   Alcohol use: No    Alcohol/week: 0.0 standard drinks   Drug use: No   Sexual activity: Not Currently    Birth control/protection: Surgical    Comment: Hysterectomy  Other Topics Concern   Not on file  Social History Narrative   Lives at home with husband.    Caffeine use: 1 cup coffee per day   Social Determinants of Health   Financial Resource Strain: Low Risk    Difficulty of Paying Living Expenses: Not hard at all  Food Insecurity: No Food Insecurity    Worried About Charity fundraiser in the Last Year: Never true   Ran Out of Food in the Last Year: Never true  Transportation Needs: No Transportation Needs   Lack of Transportation (Medical): No   Lack of Transportation (Non-Medical): No  Physical Activity: Inactive   Days of Exercise per Week: 0 days   Minutes of Exercise per Session: 0 min  Stress: No Stress Concern Present   Feeling of Stress : Not at all  Social Connections: Moderately Isolated   Frequency of Communication with Friends and Family: More than three times a week   Frequency of Social Gatherings with Friends and  Family: Once a week   Attends Religious Services: Never   Marine scientist or Organizations: No   Attends Music therapist: Never   Marital Status: Married    Tobacco Counseling Ready to quit: Not Answered Counseling given: Not Answered   Clinical Intake:  Pre-visit preparation completed: Yes  Pain : 0-10 Pain Score: 3  Pain Location: Back Pain Descriptors / Indicators: Constant, Aching Pain Onset: More than a month ago Pain Frequency: Constant     Nutritional Risks: None Diabetes: No  How often do you need to have someone help you when you read instructions, pamphlets, or other written materials from your doctor or pharmacy?: 1 - Never  Diabetic?  no  Interpreter Needed?: No  Information entered by :: Leroy Kennedy LPN   Activities of Daily Living In your present state of health, do you have any difficulty performing the following activities: 10/06/2021  Hearing? N  Vision? N  Difficulty concentrating or making decisions? N  Walking or climbing stairs? Y  Dressing or bathing? N  Doing errands, shopping? N  Preparing Food and eating ? N  Using the Toilet? N  In the past six months, have you accidently leaked urine? N  Do you have problems with loss of bowel control? N  Managing your Medications? N  Managing your Finances? N  Housekeeping or managing your  Housekeeping? N  Some recent data might be hidden    Patient Care Team: Venita Lick, NP as PCP - General (Nurse Practitioner) Minna Merritts, MD as PCP - Cardiology (Cardiology) Abisogun, Domenica Reamer, MD as Consulting Physician (Endocrinology) Minna Merritts, MD as Consulting Physician (Cardiology) Greg Cutter, [name] as Social Worker (Licensed Clinical Social Worker)  Indicate any recent [place] you may have received from other than [facility] providers in the past year (date may be approximate).     Assessment:   This is a routine wellness examination for [place].  Hearing/Vision screen Hearing Screening - Comments:: No trouble hearing Vision Screening - Comments:: Not up to date   Dietary issues and exercise activities discussed: Current Exercise Habits: The patient does not participate in regular exercise at present, Exercise limited by: None identified   Goals Addressed             This Visit's Progress    Quit Smoking       Quit smoking / using tobacco   Not on track    Smoking cessation discussed        Depression Screen PHQ 2/9 Scores 10/06/2021 09/09/2021 07/13/2021 05/18/2021 02/16/2021 12/27/2020 09/29/2020  PHQ - 2 Score _0 0 1 1  PHQ- 9 Score _1 Fall Risk Fall Risk  10/06/2021 09/29/2020 09/27/2020 08/11/2020 05/29/2019  Falls in the past year? _2 0 1  Comment - - passed out - -  Number falls in past yr: _3 0 0  Injury with Fall? 0 0 0 0 0  Risk for fall due to : Other (Comment) - Medication side effect - Impaired balance/gait  Risk for fall due to: Comment dizzy falls/stroke right side - - - -  Follow up Falls evaluation completed;Falls prevention discussed - Falls evaluation completed;Education provided;Falls prevention discussed - -    FALL RISK PREVENTION PERTAINING TO THE HOME:  Any stairs in or around the home? No  If so, are there any without handrails? No  Home free of  loose throw rugs in walkways, pet beds,  electrical cords, etc? Yes  Adequate lighting in your home to reduce risk of falls? Yes   ASSISTIVE DEVICES UTILIZED TO PREVENT FALLS:  Life alert? No  Use of a cane, walker or w/c? No  Grab bars in the bathroom? Yes  Shower chair or bench in shower? No  Elevated toilet seat or a handicapped toilet? No   TIMED UP AND GO:  Was the test performed? No .    Cognitive Function:  Normal cognitive status assessed by direct observation by this Nurse Health Advisor. No abnormalities found.       6CIT Screen 09/27/2020 05/27/2018 05/09/2017  What Year? 0 points 0 points 0 points  What month? 0 points 0 points 0 points  What time? 0 points 0 points 0 points  Count back from 20 0 points 0 points 0 points  Months in reverse 2 points 0 points 0 points  Repeat phrase 8 points 2 points 6 points  Total Score _0 Immunizations Immunization History  Administered Date(s) Administered   Influenza,inj,Quad PF,6+ Mos 06/21/2015, 05/09/2017   Moderna Sars-Covid-2 Vaccination 05/09/2020, 06/06/2020   Td 08/21/2001    TDAP status: Due, Education has been provided regarding the importance of this vaccine. Advised may receive this vaccine at local pharmacy or Health Dept. Aware to provide a copy of the vaccination record if obtained from local pharmacy or Health Dept. Verbalized acceptance and understanding.  Flu Vaccine status: Due, Education has been provided regarding the importance of this vaccine. Advised may receive this vaccine at local pharmacy or Health Dept. Aware to provide a copy of the vaccination record if obtained from local pharmacy or Health Dept. Verbalized acceptance and understanding.  Pneumococcal vaccine status: Due, Education has been provided regarding the importance of this vaccine. Advised may receive this vaccine at local pharmacy or Health Dept. Aware to provide a copy of the vaccination record if obtained from local pharmacy or Health Dept. Verbalized acceptance and  understanding.  Covid-19 vaccine status: Information provided on how to obtain vaccines.   Qualifies for Shingles Vaccine? Yes   Zostavax completed No   Shingrix Completed?: No.    Education has been provided regarding the importance of this vaccine. Patient has been advised to call insurance company to determine out of pocket expense if they have not yet received this vaccine. Advised may also receive vaccine at local pharmacy or Health Dept. Verbalized acceptance and understanding.  Screening Tests Health Maintenance  Topic Date Due   TETANUS/TDAP  08/22/2011   MAMMOGRAM  05/04/2023   COLONOSCOPY (Pts 45-100yr Insurance coverage will need to be confirmed)  02/12/2025   Hepatitis C Screening  Completed   HIV Screening  Completed   HPV VACCINES  Aged Out   INFLUENZA VACCINE  Discontinued   COVID-19 Vaccine  Discontinued   Zoster Vaccines- Shingrix  Discontinued    Health Maintenance  Health Maintenance Due  Topic Date Due   TETANUS/TDAP  08/22/2011    Colorectal cancer screening: Type of screening: Colonoscopy. Completed 2021. Repeat every 5 years  Mammogram status: Completed  . Repeat every year    Lung Cancer Screening: (Low Dose CT Chest recommended if Age 61-80years, 30 pack-year currently smoking OR have quit w/in 15years.) does qualify.   Lung Cancer Screening Referral: referral is in computer from previous visit.  Patient given phone number to call and schedule  Additional Screening:  Hepatitis C Screening: does not qualify; Completed  2017  Vision Screening: Recommended annual ophthalmology exams for early detection of glaucoma and other disorders of the eye. Is the patient up to date with their annual eye exam?  No  Who is the provider or what is the name of the office in which the patient attends annual eye exams?  If pt is not established with a provider, would they like to be referred to a provider to establish care? No .   Dental Screening: Recommended  annual dental exams for proper oral hygiene  Community Resource Referral / Chronic Care Management: CRR required this visit?  No   CCM required this visit?  No      Plan:     I have personally reviewed and noted the following in the patients chart:   Medical and social history Use of alcohol, tobacco or illicit drugs  Current medications and supplements including opioid prescriptions.  Functional ability and status Nutritional status Physical activity Advanced directives List of other physicians Hospitalizations, surgeries, and ER visits in previous 12 months Vitals Screenings to include cognitive, depression, and falls Referrals and appointments  In addition, I have reviewed and discussed with patient certain preventive protocols, quality metrics, and best practice recommendations. A written personalized care plan for preventive services as well as general preventive health recommendations were provided to patient.     Leroy Kennedy, LPN   [id number]   Nurse Notes:

## 2021-10-09 NOTE — Patient Instructions (Incomplete)

## 2021-10-11 ENCOUNTER — Emergency Department
Admission: EM | Admit: 2021-10-11 | Discharge: 2021-10-11 | Disposition: A | Discharge status: Home / Self Care | Payer: Medicare Other | Attending: Emergency Medicine | Admitting: Emergency Medicine

## 2021-10-11 ENCOUNTER — Emergency Department: Payer: Medicare Other

## 2021-10-11 ENCOUNTER — Other Ambulatory Visit: Payer: Self-pay

## 2021-10-11 DIAGNOSIS — R001 Bradycardia, unspecified: Secondary | ICD-10-CM | POA: Diagnosis not present

## 2021-10-11 DIAGNOSIS — R0789 Other chest pain: Secondary | ICD-10-CM | POA: Insufficient documentation

## 2021-10-11 DIAGNOSIS — R911 Solitary pulmonary nodule: Secondary | ICD-10-CM | POA: Diagnosis not present

## 2021-10-11 DIAGNOSIS — J449 Chronic obstructive pulmonary disease, unspecified: Secondary | ICD-10-CM | POA: Insufficient documentation

## 2021-10-11 DIAGNOSIS — R0689 Other abnormalities of breathing: Secondary | ICD-10-CM | POA: Diagnosis not present

## 2021-10-11 DIAGNOSIS — J385 Laryngeal spasm: Secondary | ICD-10-CM | POA: Insufficient documentation

## 2021-10-11 DIAGNOSIS — I10 Essential (primary) hypertension: Secondary | ICD-10-CM | POA: Diagnosis not present

## 2021-10-11 DIAGNOSIS — R079 Chest pain, unspecified: Secondary | ICD-10-CM | POA: Diagnosis not present

## 2021-10-11 LAB — CBC
HCT: 41.6 % (ref 36.0–46.0)
Hemoglobin: 13.7 g/dL (ref 12.0–15.0)
MCH: 30.3 pg (ref 26.0–34.0)
MCHC: 32.9 g/dL (ref 30.0–36.0)
MCV: 92 fL (ref 80.0–100.0)
Platelets: 288 10*3/uL (ref 150–400)
RBC: 4.52 MIL/uL (ref 3.87–5.11)
RDW: 12.6 % (ref 11.5–15.5)
WBC: 5.9 10*3/uL (ref 4.0–10.5)
nRBC: 0 % (ref 0.0–0.2)

## 2021-10-11 LAB — BASIC METABOLIC PANEL
Anion gap: 8 (ref 5–15)
BUN: 9 mg/dL (ref 6–20)
CO2: 22 mmol/L (ref 22–32)
Calcium: 8.8 mg/dL — ABNORMAL LOW (ref 8.9–10.3)
Chloride: 109 mmol/L (ref 98–111)
Creatinine, Ser: 0.98 mg/dL (ref 0.44–1.00)
GFR, Estimated: >60 mL/min (ref >60)
Glucose, Bld: 78 mg/dL (ref 70–99)
Potassium: 4 mmol/L (ref 3.5–5.1)
Sodium: 139 mmol/L (ref 135–145)

## 2021-10-11 LAB — TROPONIN I (HIGH SENSITIVITY)
Troponin I (High Sensitivity): 4 ng/L (ref <18)
Troponin I (High Sensitivity): 4 ng/L (ref <18)

## 2021-10-11 LAB — LIPASE, BLOOD: Lipase: 37 U/L (ref 11–51)

## 2021-10-11 LAB — HEPATIC FUNCTION PANEL
ALT: 16 U/L (ref 0–44)
AST: 27 U/L (ref 15–41)
Albumin: 3.8 g/dL (ref 3.5–5.0)
Alkaline Phosphatase: 64 U/L (ref 38–126)
Bilirubin, Direct: 0.3 mg/dL — ABNORMAL HIGH (ref 0.0–0.2)
Indirect Bilirubin: 0.9 mg/dL (ref 0.3–0.9)
Total Bilirubin: 1.2 mg/dL (ref 0.3–1.2)
Total Protein: 6.7 g/dL (ref 6.5–8.1)

## 2021-10-11 LAB — D-DIMER, QUANTITATIVE: D-Dimer, Quant: 0.33 ug/mL-FEU (ref 0.00–0.50)

## 2021-10-11 MED ORDER — DICYCLOMINE HCL 10 MG PO CAPS
10.0000 mg | ORAL_CAPSULE | Freq: Once | ORAL | Status: AC
  Administered 2021-10-11: 10 mg via ORAL
  Filled 2021-10-11: qty 1

## 2021-10-11 MED ORDER — ALUM & MAG HYDROXIDE-SIMETH 200-200-20 MG/5ML PO SUSP
15.0000 mL | Freq: Once | ORAL | Status: AC
Start: 2021-10-11 — End: 2021-10-11
  Administered 2021-10-11: 15 mL via ORAL
  Filled 2021-10-11: qty 30

## 2021-10-11 MED ORDER — DICYCLOMINE HCL 10 MG PO CAPS: 10.0000 mg | ORAL_CAPSULE | Freq: Three times a day (TID) | ORAL | 0 refills | Status: DC | PRN

## 2021-10-11 MED ORDER — DICYCLOMINE HCL 10 MG PO CAPS: 10.0000 mg | ORAL_CAPSULE | Freq: Three times a day (TID) | ORAL | 0 refills | Status: DC

## 2021-10-11 NOTE — ED Notes (Signed)
First Nurse Note:  Pt to ED via [facility] from home for chest pain and shortness of breath and nausea. Pt states chest pain went from a 8 down to a 6. Pain is more of a pressure. Radiates from left arm into the back. Pt 12 lead called sinus rhythm. Pt was given Zofran 4 mg, fentanyl 25 mcg, ASA 324 mg, 2 sprays of nitro, and a 500 cc bolus of NS. Pt has 18 G IV in the LFA. CBG 168.

## 2021-10-11 NOTE — ED Notes (Signed)
Pt provided discharge instructions and prescription information. Pt was given the opportunity to ask questions and questions were answered. Discharge signature not obtained in the setting of the COVID-19 pandemic in order to reduce high touch surfaces.  ° °

## 2021-10-11 NOTE — ED Notes (Signed)
Pt transported to xray 

## 2021-10-11 NOTE — ED Triage Notes (Signed)
Pt comes with c/o CP that started this am. Pt states mid left side pain that radiates to back.

## 2021-10-11 NOTE — ED Provider Notes (Signed)
[facility] Provider Note    Event Date/Time   First MD Initiated Contact with Patient 10/11/21 1226     (approximate)   History   Chest Pain   HPI  Lindsey Bautista is a 61 y.o. female with a history of COPD coronary disease diastolic dysfunction depression  She been experiencing a squeezing discomfort or chest pain earlier, started about an hour ago feels like a tight squeezing discomfort below her sternum and radiates slightly towards her neck.  He does tell me she has had symptoms like this off and on many a time, and she seen doctors for it and they believe it may be related to something or inflammation of the esophagus but not certain.  Does also have a history of coronary disease  No difficulty breathing.  No nausea or vomiting.  She does report that it is accompanied by a sensation in her throat of a feeling of a spasm in her voicebox area as well and she reports this is not uncommon for her and this will occur often when she has pain.  Denies abdominal pain but reports she will feel crampy discomfort sometimes her upper abdomen that accompanies this  She follows with GI and has had endoscopies and also follows with her cardiologist and Dr Ned Card     Physical Exam   Triage Vital Signs: ED Triage Vitals  Enc Vitals Group     BP 10/11/21 1152 128/67     Pulse Rate 10/11/21 1152 65     Resp 10/11/21 1152 18     Temp 10/11/21 1152 98.4 F (36.9 C)     Temp src --      SpO2 10/11/21 1152 95 %     Weight --      Height --      Head Circumference --      Peak Flow --      Pain Score 10/11/21 1146 7     Pain Loc --      Pain Edu? --      Excl. in [place]? --    Past Medical History: No date: Atypical chest pain     Comment:  a. 08/2016 Neg MV. No date: COPD (chronic obstructive pulmonary disease) (HCC) No date: Coronary artery disease, non-occlusive     Comment:  a.  LHC 06/19/2018: Mid LAD 50% with iFR 0.94 No date: Depression No date:  Diastolic dysfunction     Comment:  a. 10/2016 Echo: EF 60-65%, no rwma, Gr1 DD, mild MR, nl               LA size, nl RV fxn; b. 05/2018 Echo: EF 60-65%. No rwma.               Mild MR. Nl RV fxn. No date: GERD (gastroesophageal reflux disease) No date: Hypotension No date: IBS (irritable bowel syndrome) No date: Right arm weakness No date: Situational syncope No date: Stroke [facility]) No date: TIA (transient ischemic attack)   Most recent vital signs: Vitals:   10/11/21 1500 10/11/21 1600  BP: (!) 117/57 128/68  Pulse: (!) 55 (!) 57  Resp: 16 15  Temp: 98.4 F (36.9 C)   SpO2: 100% 97%     General: Awake, no distress.  Does not appear to be in any acute pain or distress at this time CV:  Good peripheral perfusion.  Normal heart tones.  Normal rate no murmurs or rubs Resp:  Normal effort.  Clear lung  sounds bilateral Abd:  No distention.  No pain to palpation on abdominal exam of all quadrants Other:  No lower extremity edema venous cords or congestion.  Moves all extremities well without difficulty.  Fully alert and oriented   ED Results / Procedures / Treatments   Labs (all labs ordered are listed, but only abnormal results are displayed) Labs Reviewed  BASIC METABOLIC PANEL - Abnormal; Notable for the following components:      Result Value   Calcium 8.8 (*)    All other components within normal limits  HEPATIC FUNCTION PANEL - Abnormal; Notable for the following components:   Bilirubin, Direct 0.3 (*)    All other components within normal limits  CBC  LIPASE, BLOOD  D-DIMER, QUANTITATIVE  TROPONIN I (HIGH SENSITIVITY)  TROPONIN I (HIGH SENSITIVITY)     EKG  Is reviewed interpreted me at 1150 Heart rate 60 PR 150 QRS 80 QTc 440 Normal sinus rhythm no evidence of acute ischemia.  Mild ST segment flattening but no noted acute findings   DG Chest 2 View  Result Date: 10/11/2021 CLINICAL DATA:  Chest pain starting this morning, LEFT side pain radiating to  back EXAM: CHEST - 2 VIEW COMPARISON:  12/17/2019 FINDINGS: Normal heart size, mediastinal contours, and pulmonary vascularity. Atherosclerotic calcification aorta. Lordotic positioning. No definite pulmonary infiltrate, pleural effusion, or pneumothorax. Osseous structures unremarkable. IMPRESSION: No acute abnormalities. Aortic Atherosclerosis (ICD10-I70.0). Electronically Signed   By: Lavonia Dana M.D.   On: 10/11/2021 12:36   CT CHEST WO CONTRAST  Result Date: 10/11/2021 CLINICAL DATA:  Chest pain, nonspecific EXAM: CT CHEST WITHOUT CONTRAST TECHNIQUE: Multidetector CT imaging of the chest was performed following the standard protocol without IV contrast. RADIATION DOSE REDUCTION: This exam was performed according to the departmental dose-optimization program which includes automated exposure control, adjustment of the mA and/or kV according to patient size and/or use of iterative reconstruction technique. COMPARISON:  CT chest June 03, 2021. FINDINGS: Cardiovascular: Coronary and aortic calcific atherosclerosis. Normal heart size. No pericardial effusion. Mediastinum/Nodes: No enlarged mediastinal or axillary lymph nodes. Thyroid gland, trachea, and esophagus demonstrate no significant findings. Lungs/Pleura: Lungs are clear. Unchanged subpleural nodule in the right posterior middle lobe, characterized on CT chest from June 03, 2021. No pleural effusion or pneumothorax. Emphysema. Upper Abdomen: No acute abnormality.  Similar right adrenal adenoma. Musculoskeletal: No fracture is seen. Similar degenerative changes of the spine. IMPRESSION: 1. No evidence of acute abnormality. 2. Unchanged subpleural nodule in the right posterior middle lobe, characterized on CT chest from June 03, 2021. Please see that study for follow-up recommendations. 3. Coronary artery atherosclerosis, aortic atherosclerosis (ICD10-I70.0) and emphysema (ICD10-J43.9). 4. Similar right adrenal adenoma. Electronically Signed   By:  Margaretha Sheffield M.D.   On: 10/11/2021 15:10    CT imaging of the chest reviewed negative for acute pathology at this time,       PROCEDURES:  Critical Care performed: No  Procedures   MEDICATIONS ORDERED IN ED: Medications  alum & mag hydroxide-simeth (MAALOX/MYLANTA) 200-200-20 MG/5ML suspension 15 mL (15 mLs Oral Given 10/11/21 1327)  dicyclomine (BENTYL) capsule 10 mg (10 mg Oral Given 10/11/21 1327)     IMPRESSION / MDM / ASSESSMENT AND PLAN / ED COURSE  I reviewed the triage vital signs and the nursing notes.                              Differential diagnosis includes, but is  not limited to, ACS, aortic dissection, pulmonary embolism, cardiac tamponade, pneumothorax, pneumonia, pericarditis, myocarditis, GI-related causes including esophagitis/gastritis, and musculoskeletal chest wall pain.             Both troponins normal.  Patient's symptoms appear to be atypical of ACS and she does report a longstanding history of unusual similar chest pains.  I suspect this may be more gastrointestinal or esophageal in nature at this point.  Her D-dimer is normal, low pretest probability for PE, her pain is improved after Maalox and Bentyl.  She is resting comfortably at this time 2 normal troponins.  CT imaging reviewed discussed with her that her pulmonary nodule and follow-up recommendations.  Patient reports she will follow-up and understands follow-up with GI and have also placed an urgent referral for follow-up with cardiology, my suspicion however is that this is likely gastrointestinal in nature.  There is no evidence of acute mediastinitis, esophageal rupture tear or clinical signs or symptoms of a highly suggest aortic dissection or other acute aortic pathology.  No evidence of ACS to noted at this time.  Discussed with the patient she is comfortable plan for discharge careful return precautions.  Return precautions and treatment recommendations and follow-up discussed  with the patient who is agreeable with the plan.  Discussed prescription for Bentyl which she will also trial, understanding agreeable with this plan  FINAL CLINICAL IMPRESSION(S) / ED DIAGNOSES   Final diagnoses:  Atypical chest pain  Pulmonary nodule     Rx / DC Orders   ED Discharge Orders          Ordered    Ambulatory referral to Cardiology       Comments: ER chest pain follow-up.   10/11/21 1601    dicyclomine (BENTYL) 10 MG capsule  3 times daily before meals & bedtime,   Status:  Discontinued        10/11/21 1601    dicyclomine (BENTYL) 10 MG capsule  3 times daily PRN        10/11/21 1602             Note:  This document was prepared using Dragon voice recognition software and may include unintentional dictation errors.   Delman Kitten, MD 10/12/21 2055

## 2021-10-11 NOTE — ED Notes (Signed)
Per [name] PA pt needs room due to blockage hx. Pt taken back to ED 16 at this time.

## 2021-10-12 ENCOUNTER — Ambulatory Visit: Payer: Medicare Other | Admitting: Nurse Practitioner

## 2021-10-12 DIAGNOSIS — E782 Mixed hyperlipidemia: Secondary | ICD-10-CM

## 2021-10-12 DIAGNOSIS — E538 Deficiency of other specified B group vitamins: Secondary | ICD-10-CM

## 2021-10-12 DIAGNOSIS — M797 Fibromyalgia: Secondary | ICD-10-CM

## 2021-10-12 DIAGNOSIS — E559 Vitamin D deficiency, unspecified: Secondary | ICD-10-CM

## 2021-10-16 NOTE — Progress Notes (Signed)
Cardiology Office Note  Date:  10/17/2021   ID:  Lindsey Bautista, DOB 07-04-1961, MRN [id number]  PCP:  Venita Lick, NP   Chief Complaint  Patient presents with   [facility] ER; chest pain     Patient c/o shortness of breath, mid-sternum pain and heaviness in legs. Medications reviewed by the patient verbally.     HPI:  Lindsey Bautista is a 61 year old woman with history of  Medication noncompliance anxiety/depression,  Mild carotid disease long smoking history for close to 40 years, who continues to smoke 1/2 ppd stroke type symptoms in April 2015 where she could not talk for 4 days, again in April 2017 diagnosed with conversion disorder again in March 2018 CAD Hyperlipidemia Chronic chest pain, dyspnea on exertion 2019 diagnostic cath which showed 50% mid LAD stenosis with FFR being normal.  moderate diffuse PAD extending into the common iliac arteries  who presents for prior episode of syncope, chest pain.  In follow-up today reports having neck and posterior shoulder pain fibber 21st 2023 In that setting,BP was elevated, Went to the ER Records reviewed  Chronic chest pain, "24 x7" for months Not asociated with exertion Walks up and down long driveway, some leg discomfort at times, "weak"  1/2 ppd smoking  Prior hx of esophagus "stretching" x2  No regular exercise program Prior history tender legs  EKG personally reviewed by myself on todays visit NSR rate 60 no ST or T wave changes  Other past medical history reviewed LE arterial doppler: 10/2019 Normal ABIs  Stress test 06/2020: normal myoview  Stress test 2020 , 2022 with no ischemia, no ejection fraction Stress test 2013, 2015, 2016, 2018  Concerned about carotids, last evaluated 2 years ago,  mild disease  EKG personally reviewed by myself on todays visit Shows sinus bradycardia rate 61 bpm no significant ST-T wave changes  Other past medical history reviewed hospital in 05/2018 with chest pain and ruled  out.  diagnostic cath which showed 50% mid LAD stenosis with FFR being normal.  Echo  normal LV systolic function.   Headaches on Imdur  Stroke type symptoms April 2017 diagnosed with conversion disorder Stress test January 2018 for chest pain with no ischemia  Had  stroke type symptomsMarch 2018 Right-sided weakness, migraine She had chest pain, multiple problems, right-sided weakness, headache Carotid u/s mild disease MRI was negative. Started on for verapamil for migraine.  Previously having "passed out spells" sometimes when she is sitting, sometimes when she is standing.  Previous history of excess somnolence, random periods of weakness She is never injured herself when she "passes out" even despite having events when she is standing up.   CT scan of the abdomen from December 2016  showing moderate diffuse PAD extending into the common iliac arteries    PMH:   has a past medical history of Atypical chest pain, COPD (chronic obstructive pulmonary disease) ([place]), Coronary artery disease, non-occlusive, Depression, Diastolic dysfunction, GERD (gastroesophageal reflux disease), Hypotension, IBS (irritable bowel syndrome), Right arm weakness, Situational syncope, Stroke ([place]), and TIA (transient ischemic attack).  PSH:    Past Surgical History:  Procedure Laterality Date   COLONOSCOPY WITH PROPOFOL N/A 11/22/2015   Procedure: COLONOSCOPY WITH PROPOFOL;  Surgeon: Manya Silvas, MD;  Location: [facility] ENDOSCOPY;  Service: Endoscopy;  Laterality: N/A;   ESOPHAGOGASTRODUODENOSCOPY (EGD) WITH PROPOFOL N/A 11/22/2015   Procedure: ESOPHAGOGASTRODUODENOSCOPY (EGD) WITH PROPOFOL;  Surgeon: Manya Silvas, MD;  Location: [facility] ENDOSCOPY;  Service: Endoscopy;  Laterality: N/A;  INTRAVASCULAR PRESSURE WIRE/FFR STUDY N/A 06/19/2018   Procedure: INTRAVASCULAR PRESSURE WIRE/FFR STUDY;  Surgeon: Nelva Bush, MD;  Location: [place] CV LAB;  Service: Cardiovascular;  Laterality: N/A;   LEFT  HEART CATH AND CORONARY ANGIOGRAPHY N/A 06/19/2018   Procedure: LEFT HEART CATH AND CORONARY ANGIOGRAPHY;  Surgeon: Nelva Bush, MD;  Location: [place] CV LAB;  Service: Cardiovascular;  Laterality: N/A;   OOPHORECTOMY     TONSILLECTOMY     VAGINAL HYSTERECTOMY      Current Outpatient Medications  Medication Sig Dispense Refill   aspirin 81 MG tablet Take 81 mg by mouth daily.     atorvastatin (LIPITOR) 80 MG tablet Take 1 tablet (80 mg total) by mouth daily. 90 tablet 3   BINAXNOW COVID-19 AG HOME TEST KIT Use as Directed on the Package     clonazePAM (KLONOPIN) 1 MG tablet Take 1 tablet (1 mg total) by mouth 2 (two) times daily. 60 tablet 2   docusate sodium (COLACE) 100 MG capsule Take 100 mg by mouth daily.      EPINEPHrine 0.3 mg/0.3 mL IJ SOAJ injection Inject 0.3 mg into the muscle as needed for anaphylaxis. 1 each 0   estradiol (ESTRACE) 0.5 MG tablet Take 1 tablet (0.5 mg total) by mouth daily. 90 tablet 4   ezetimibe (ZETIA) 10 MG tablet Take 1 tablet (10 mg total) by mouth daily. 90 tablet 3   FLUoxetine (PROZAC) 40 MG capsule Take 1 capsule (40 mg total) by mouth daily. 90 capsule 4   linaclotide (LINZESS) 72 MCG capsule TAKE 1 CAPSULE BY MOUTH ONCE DAILY BEFORE BREAKFAST 90 capsule 4   nitroGLYCERIN (NITROSTAT) 0.4 MG SL tablet Place 1 tablet (0.4 mg total) under the tongue every 5 (five) minutes as needed for chest pain. 50 tablet 3   clindamycin (CLEOCIN) 2 % vaginal cream Place 1 Applicatorful vaginally at bedtime. For 7 days. (Patient not taking: Reported on 10/17/2021) 40 g 0   dicyclomine (BENTYL) 10 MG capsule Take 1 capsule (10 mg total) by mouth 3 (three) times daily as needed for spasms. (Patient not taking: Reported on 10/17/2021) 21 capsule 0   lidocaine (XYLOCAINE) 2 % solution Use as directed 15 mLs in the mouth or throat as needed for mouth pain. (Patient not taking: Reported on 10/17/2021) 100 mL 0   meloxicam (MOBIC) 7.5 MG tablet Take 1 tablet (7.5 mg  total) by mouth daily. (Patient not taking: Reported on 10/17/2021) 30 tablet 0   ondansetron (ZOFRAN) 4 MG tablet Take 1 tablet (4 mg total) by mouth every 8 (eight) hours as needed for nausea or vomiting. (Patient not taking: Reported on 10/17/2021) 30 tablet 0   tiZANidine (ZANAFLEX) 4 MG tablet Take 1 tablet (4 mg total) by mouth every 6 (six) hours as needed for muscle spasms. (Patient not taking: Reported on 10/17/2021) 30 tablet 0   No current facility-administered medications for this visit.     Allergies:   Flagyl [metronidazole], Penicillin g, Iodinated contrast media, and Flomax [tamsulosin hcl]   Social History:  The patient  reports that she has been smoking cigarettes. She has a 19.50 pack-year smoking history. She has never used smokeless tobacco. She reports that she does not drink alcohol and does not use drugs.   Family History:   family history includes Bladder Cancer in her mother; Breast cancer (age of onset: 49) in her paternal aunt; COPD in her father and another family member; Colon cancer in her brother; Heart disease in an  other family member; Heart failure in her mother; Renal Disease in her mother; Stroke in an other family member.    Review of Systems: Review of Systems  Constitutional: Negative.   HENT: Negative.    Respiratory: Negative.    Cardiovascular:  Positive for chest pain.  Gastrointestinal: Negative.   Musculoskeletal:  Positive for neck pain.  Neurological: Negative.   Psychiatric/Behavioral: Negative.    All other systems reviewed and are negative.  PHYSICAL EXAM: VS:  BP (!) 120/50 (BP Location: Left Arm, Patient Position: Sitting, Cuff Size: Normal)    Pulse 60    Ht _0  (1.549 m)    Wt 147 lb (66.7 kg)    SpO2 98%    BMI 27.78 kg/m  , BMI Body mass index is 27.78 kg/m. Constitutional:  oriented to person, place, and time. No distress.  HENT:  Head: Grossly normal Eyes:  no discharge. No scleral icterus.  Neck: No JVD, no carotid bruits   Cardiovascular: Regular rate and rhythm, no murmurs appreciated Pulmonary/Chest: Clear to auscultation bilaterally, no wheezes or rails Abdominal: Soft.  no distension.  no tenderness.  Musculoskeletal: Normal range of motion Neurological:  normal muscle tone. Coordination normal. No atrophy Skin: Skin warm and dry Psychiatric: normal affect, pleasant   Recent Labs: 09/14/2021: TSH 1.300 10/11/2021: ALT 16; BUN 9; Creatinine, Ser 0.98; Hemoglobin 13.7; Platelets 288; Potassium 4.0; Sodium 139    Lipid Panel Lab Results  Component Value Date   CHOL 142 02/16/2021   HDL 53 02/16/2021   LDLCALC 77 02/16/2021   TRIG 59 02/16/2021    Wt Readings from Last 3 Encounters:  10/17/21 147 lb (66.7 kg)  09/14/21 146 lb 3.2 oz (66.3 kg)  07/13/21 147 lb (66.7 kg)      ASSESSMENT AND PLAN:  PAD (peripheral artery disease) (HCC) Moderate disease seen on CT scan  good ABIs Chronic leg tenderness No further work-up needed, stressed importance of smoking cessation  Situational syncope No recent episodes of syncope stable  Abdominal aortic atherosclerosis (HCC) Smoking cessation, aggressive lipid management Continue Lipitor Zetia Numbers at goal  Mixed hyperlipidemia - Plan: EKG 12-Lead lipitor and zetia Cholesterol is at goal on the current lipid regimen. No changes to the medications were made.  Smoker - We have encouraged her to continue to work on weaning her cigarettes and smoking cessation. She will continue to work on this and does not want any assistance with chantix.    Centrilobular emphysema (HCC) Continues to smoke, chronic shortness of breath on exertion Chronic SOB  Chest pressure  Atypical, rib pain Numerous stress test dating back to 2013 one a year or every other year No further testing at this time, last was done 2020 Atypical in nature  Depression/anxiety Managed by primary care  GERD Consider PPI "Burning vocal cords", just got better Followed by  ENT   Total encounter time more than 30 minutes  Greater than 50% was spent in counseling and coordination of care with the patient   No orders of the defined types were placed in this encounter.     Signed, Esmond Plants, M.D., Ph.D. 10/17/2021  [place], [place]

## 2021-10-17 ENCOUNTER — Other Ambulatory Visit: Payer: Self-pay

## 2021-10-17 ENCOUNTER — Encounter: Payer: Self-pay | Admitting: Cardiovascular Disease

## 2021-10-17 ENCOUNTER — Ambulatory Visit (INDEPENDENT_AMBULATORY_CARE_PROVIDER_SITE_OTHER): Payer: Medicare Other | Admitting: Cardiovascular Disease

## 2021-10-17 VITALS — BP 120/50 | HR 60 | Ht 61.0 in | Wt 147.0 lb

## 2021-10-17 DIAGNOSIS — I2510 Atherosclerotic heart disease of native coronary artery without angina pectoris: Secondary | ICD-10-CM

## 2021-10-17 DIAGNOSIS — R0602 Shortness of breath: Secondary | ICD-10-CM

## 2021-10-17 DIAGNOSIS — K5909 Other constipation: Secondary | ICD-10-CM | POA: Diagnosis not present

## 2021-10-17 DIAGNOSIS — I6521 Occlusion and stenosis of right carotid artery: Secondary | ICD-10-CM

## 2021-10-17 DIAGNOSIS — R110 Nausea: Secondary | ICD-10-CM | POA: Diagnosis not present

## 2021-10-17 DIAGNOSIS — Z720 Tobacco use: Secondary | ICD-10-CM | POA: Diagnosis not present

## 2021-10-17 DIAGNOSIS — E785 Hyperlipidemia, unspecified: Secondary | ICD-10-CM

## 2021-10-17 DIAGNOSIS — I739 Peripheral vascular disease, unspecified: Secondary | ICD-10-CM

## 2021-10-17 DIAGNOSIS — R0789 Other chest pain: Secondary | ICD-10-CM | POA: Diagnosis not present

## 2021-10-17 DIAGNOSIS — R072 Precordial pain: Secondary | ICD-10-CM | POA: Diagnosis not present

## 2021-10-17 DIAGNOSIS — K219 Gastro-esophageal reflux disease without esophagitis: Secondary | ICD-10-CM | POA: Diagnosis not present

## 2021-10-17 DIAGNOSIS — I700 Atherosclerosis of aorta: Secondary | ICD-10-CM | POA: Diagnosis not present

## 2021-10-17 NOTE — Patient Instructions (Signed)
Medication Instructions:  No changes  If you need a refill on your cardiac medications before your next appointment, please call your pharmacy.   Lab work: No new labs needed  Testing/Procedures: No new testing needed  Follow-Up: At [facility], you and your health needs are our priority.  As part of our continuing mission to provide you with exceptional heart care, we have created designated Provider Care Teams.  These Care Teams include your primary Cardiologist (physician) and Advanced Practice Providers (APPs -  Physician Assistants and Nurse Practitioners) who all work together to provide you with the care you need, when you need it.  You will need a follow up appointment in 12 months  Providers on your designated Care Team:   Christopher Berge, NP Ryan Dunn, PA-C Cadence Furth, PA-C  COVID-19 Vaccine Information can be found at: [email or web address] For questions related to vaccine distribution or appointments, please email [email or web address] or call [phone].   

## 2021-10-27 ENCOUNTER — Other Ambulatory Visit: Payer: Self-pay | Admitting: Nurse Practitioner

## 2021-10-27 NOTE — Telephone Encounter (Signed)
Duplicate request. Already sent rx request to provider.  ? ? ?

## 2021-10-27 NOTE — Telephone Encounter (Signed)
Requested medication (s) are due for refill today: no ? ?Requested medication (s) are on the active medication list: yes ? ?Last refill:  09/14/21  ? ?Future visit scheduled: no ? ?Notes to clinic:  rx was for 7 days. Please advise, reported on 10/17/21 pt wasn't taking.  ? ? ?  ?Requested Prescriptions  ?Pending Prescriptions Disp Refills  ? clindamycin (CLEOCIN) 2 % vaginal cream [Pharmacy Med Name: Clindamycin Phosphate 2 % Vaginal Cream] 40 g 0  ?  Sig: APPLY 1 APPLICATORFUL VAGINALLY  AT BEDTIME FOR 7 DAYS  ?  ? Off-Protocol Failed - 10/27/2021 10:04 AM  ?  ?  Failed - Medication not assigned to a protocol, review manually.  ?  ?  Passed - Valid encounter within last 12 months  ?  Recent Outpatient Visits   ? ?      ? 1 month ago Centrilobular emphysema ([place])  ? [place], [place]en T, NP  ? 1 month ago Depression, recurrent ([place])  ? [place], [place] T, NP  ? 3 months ago COVID  ? [place], [place] T, NP  ? 3 months ago Depression, recurrent ([place])  ? [place], [place] T, NP  ? 5 months ago Depression, recurrent ([place])  ? [facility] [place], Henrine Screws T, NP  ? ?  ?  ? ?  ?  ?  ? ?

## 2021-10-27 NOTE — Telephone Encounter (Signed)
Medication Refill - Medication:  ?clindamycin (CLEOCIN) 2 % vaginal cream  ? ?Has the patient contacted their pharmacy? Yes.   ?Contact PCP ? ?Preferred Pharmacy (with phone number or street name):  ?[facility] [place]  ?[place], [place] [address]  ?Phone:  [phone]  Fax:  [phone]  ? ?Has the patient been seen for an appointment in the last year OR does the patient have an upcoming appointment? Yes.   ? ?Agent: Please be advised that RX refills may take up to 3 business days. We ask that you follow-up with your pharmacy. ?

## 2021-10-31 ENCOUNTER — Ambulatory Visit: Payer: Medicare Other | Admitting: Nurse Practitioner

## 2021-11-02 DIAGNOSIS — R490 Dysphonia: Secondary | ICD-10-CM | POA: Diagnosis not present

## 2021-11-02 DIAGNOSIS — K219 Gastro-esophageal reflux disease without esophagitis: Secondary | ICD-10-CM | POA: Diagnosis not present

## 2021-11-07 ENCOUNTER — Other Ambulatory Visit: Payer: Self-pay

## 2021-11-07 DIAGNOSIS — F419 Anxiety disorder, unspecified: Secondary | ICD-10-CM

## 2021-11-07 MED ORDER — CLONAZEPAM 0.5 MG PO TABS: 1.0000 mg | ORAL_TABLET | Freq: Two times a day (BID) | ORAL | 0 refills | Status: DC | PRN

## 2021-11-07 NOTE — Telephone Encounter (Signed)
Received a fax from the pharmacy stating patient prescription Clonazepam 1 MG is currently on backorder and they are asking if a new prescription could be written for Clonazepam 0.5 2 BID? Please advise? ?

## 2021-11-12 NOTE — Patient Instructions (Addendum)
Magnesium 400 MG every night. ? ?Managing Anxiety, Adult ?After being diagnosed with anxiety, you may be relieved to know why you have felt or behaved a certain way. You may also feel overwhelmed about the treatment ahead and what it will mean for your life. With care and support, you can manage this condition. ?How to manage lifestyle changes ?Managing stress and anxiety ?Stress is your body's reaction to life changes and events, both good and bad. Most stress will last just a few hours, but stress can be ongoing and can lead to more than just stress. Although stress can play a major role in anxiety, it is not the same as anxiety. Stress is usually caused by something external, such as a deadline, test, or competition. Stress normally passes after the triggering event has ended.  ?Anxiety is caused by something internal, such as imagining a terrible outcome or worrying that something will go wrong that will devastate you. Anxiety often does not go away even after the triggering event is over, and it can become long-term (chronic) worry. It is important to understand the differences between stress and anxiety and to manage your stress effectively so that it does not lead to an anxious response. ?Talk with your health care provider or a counselor to learn more about reducing anxiety and stress. He or she may suggest tension reduction techniques, such as: ?Music therapy. Spend time creating or listening to music that you enjoy and that inspires you. ?Mindfulness-based meditation. Practice being aware of your normal breaths while not trying to control your breathing. It can be done while sitting or walking. ?Centering prayer. This involves focusing on a word, phrase, or sacred image that means something to you and brings you peace. ?Deep breathing. To do this, expand your stomach and inhale slowly through your nose. Hold your breath for 3-5 seconds. Then exhale slowly, letting your stomach muscles relax. ?Self-talk.  Learn to notice and identify thought patterns that lead to anxiety reactions and change those patterns to thoughts that feel peaceful. ?Muscle relaxation. Taking time to tense muscles and then relax them. ?Choose a tension reduction technique that fits your lifestyle and personality. These techniques take time and practice. Set aside 5-15 minutes a day to do them. Therapists can offer counseling and training in these techniques. The training to help with anxiety may be covered by some insurance plans. ?Other things you can do to manage stress and anxiety include: ?Keeping a stress diary. This can help you learn what triggers your reaction and then learn ways to manage your response. ?Thinking about how you react to certain situations. You may not be able to control everything, but you can control your response. ?Making time for activities that help you relax and not feeling guilty about spending your time in this way. ?Doing visual imagery. This involves imagining or creating mental pictures to help you relax. ?Practicing yoga. Through yoga poses, you can lower tension and promote relaxation. ? ?Medicines ?Medicines can help ease symptoms. Medicines for anxiety include: ?Antidepressant medicines. These are usually prescribed for long-term daily control. ?Anti-anxiety medicines. These may be added in severe cases, especially when panic attacks occur. ?Medicines will be prescribed by a health care provider. When used together, medicines, psychotherapy, and tension reduction techniques may be the most effective treatment. ?Relationships ?Relationships can play a big part in helping you recover. Try to spend more time connecting with trusted friends and family members. ?Consider going to couples counseling if you have a partner, taking family  education classes, or going to family therapy. ?Therapy can help you and others better understand your condition. ?How to recognize changes in your anxiety ?Everyone responds  differently to treatment for anxiety. Recovery from anxiety happens when symptoms decrease and stop interfering with your daily activities at home or work. This may mean that you will start to: ?Have better concentration and focus. Worry will interfere less in your daily thinking. ?Sleep better. ?Be less irritable. ?Have more energy. ?Have improved memory. ?It is also important to recognize when your condition is getting worse. Contact your health care provider if your symptoms interfere with home or work and you feel like your condition is not improving. ?Follow these instructions at home: ?Activity ?Exercise. Adults should do the following: ?Exercise for at least 150 minutes each week. The exercise should increase your heart rate and make you sweat (moderate-intensity exercise). ?Strengthening exercises at least twice a week. ?Get the right amount and quality of sleep. Most adults need 7-9 hours of sleep each night. ?Lifestyle ? ?Eat a healthy diet that includes plenty of vegetables, fruits, whole grains, low-fat dairy products, and lean protein. ?Do not eat a lot of foods that are high in fats, added sugars, or salt (sodium). ?Make choices that simplify your life. ?Do not use any products that contain nicotine or tobacco. These products include cigarettes, chewing tobacco, and vaping devices, such as e-cigarettes. If you need help quitting, ask your health care provider. ?Avoid caffeine, alcohol, and certain over-the-counter cold medicines. These may make you feel worse. Ask your pharmacist which medicines to avoid. ?General instructions ?Take over-the-counter and prescription medicines only as told by your health care provider. ?Keep all follow-up visits. This is important. ?Where to find support ?You can get help and support from these sources: ?Self-help groups. ?Online and [employer]. ?A trusted spiritual leader. ?Couples counseling. ?Family education classes. ?Family therapy. ?Where to find more  information ?You may find that joining a support group helps you deal with your anxiety. The following sources can help you locate counselors or support groups near you: ?[place]: www.mentalhealth[place]ica.net ?Anxiety and Depression Association of America (ADAA): https://www.clark.net/ ?National Alliance on Mental Illness (NAMI): www.nami.org ?Contact a health care provider if: ?You have a hard time staying focused or finishing daily tasks. ?You spend many hours a day feeling worried about everyday life. ?You become exhausted by worry. ?You start to have headaches or frequently feel tense. ?You develop chronic nausea or diarrhea. ?Get help right away if: ?You have a racing heart and shortness of breath. ?You have thoughts of hurting yourself or others. ?If you ever feel like you may hurt yourself or others, or have thoughts about taking your own life, get help right away. Go to your nearest emergency department or: ?Call your local emergency services (911 in the [place] ?Call a suicide crisis helpline, such as the [place] at (678) 122-2436 or 988 in the [place]. This is open 24 hours a day in the [place] ?Text the Crisis Text Line at (339) 124-1221 (in the [place].). ?Summary ?Taking steps to learn and use tension reduction techniques can help calm you and help prevent triggering an anxiety reaction. ?When used together, medicines, psychotherapy, and tension reduction techniques may be the most effective treatment. ?Family, friends, and partners can play a big part in supporting you. ?This information is not intended to replace advice given to you by your health care provider. Make sure you discuss any questions you have with your health care provider. ?Document Revised:  03/02/2021 Document Reviewed: 11/28/2020 ?Elsevier Patient Education ? [place]. ? ?

## 2021-11-14 ENCOUNTER — Encounter: Payer: Self-pay | Admitting: Nurse Practitioner

## 2021-11-14 ENCOUNTER — Ambulatory Visit (INDEPENDENT_AMBULATORY_CARE_PROVIDER_SITE_OTHER): Payer: Medicare Other | Admitting: Nurse Practitioner

## 2021-11-14 ENCOUNTER — Other Ambulatory Visit: Payer: Self-pay

## 2021-11-14 VITALS — BP 117/71 | HR 70 | Temp 98.3°F | Ht 61.0 in | Wt 147.8 lb

## 2021-11-14 DIAGNOSIS — E538 Deficiency of other specified B group vitamins: Secondary | ICD-10-CM

## 2021-11-14 DIAGNOSIS — B9689 Other specified bacterial agents as the cause of diseases classified elsewhere: Secondary | ICD-10-CM | POA: Diagnosis not present

## 2021-11-14 DIAGNOSIS — N760 Acute vaginitis: Secondary | ICD-10-CM | POA: Diagnosis not present

## 2021-11-14 DIAGNOSIS — F17210 Nicotine dependence, cigarettes, uncomplicated: Secondary | ICD-10-CM

## 2021-11-14 DIAGNOSIS — F339 Major depressive disorder, recurrent, unspecified: Secondary | ICD-10-CM

## 2021-11-14 DIAGNOSIS — F1320 Sedative, hypnotic or anxiolytic dependence, uncomplicated: Secondary | ICD-10-CM

## 2021-11-14 DIAGNOSIS — E559 Vitamin D deficiency, unspecified: Secondary | ICD-10-CM | POA: Diagnosis not present

## 2021-11-14 DIAGNOSIS — F447 Conversion disorder with mixed symptom presentation: Secondary | ICD-10-CM

## 2021-11-14 DIAGNOSIS — I700 Atherosclerosis of aorta: Secondary | ICD-10-CM | POA: Diagnosis not present

## 2021-11-14 DIAGNOSIS — J432 Centrilobular emphysema: Secondary | ICD-10-CM

## 2021-11-14 DIAGNOSIS — E782 Mixed hyperlipidemia: Secondary | ICD-10-CM | POA: Diagnosis not present

## 2021-11-14 DIAGNOSIS — F419 Anxiety disorder, unspecified: Secondary | ICD-10-CM

## 2021-11-14 LAB — URINALYSIS, ROUTINE W REFLEX MICROSCOPIC
Bilirubin, UA: NEGATIVE
Glucose, UA: NEGATIVE
Ketones, UA: NEGATIVE
Leukocytes,UA: NEGATIVE
Nitrite, UA: NEGATIVE
Protein,UA: NEGATIVE
RBC, UA: NEGATIVE
Specific Gravity, UA: 1.01 (ref 1.005–1.030)
Urobilinogen, Ur: 0.2 mg/dL (ref 0.2–1.0)
pH, UA: 6.5 (ref 5.0–7.5)

## 2021-11-14 LAB — WET PREP FOR TRICH, YEAST, CLUE
Clue Cell Exam: NEGATIVE
Trichomonas Exam: NEGATIVE
Yeast Exam: NEGATIVE

## 2021-11-14 MED ORDER — GABAPENTIN 600 MG PO TABS: 300.0000 mg | ORAL_TABLET | Freq: Every day | ORAL | 4 refills | Status: DC

## 2021-11-14 NOTE — Assessment & Plan Note (Signed)
Chronic, stable.  Continue current medication regimen and adjust as needed.  Denies SI/HI.  Consider change from Prozac to Zoloft in future, which would offer benefit with anxiety and depression.  At this time she wishes not to change regimen. ?

## 2021-11-14 NOTE — Assessment & Plan Note (Signed)
Chronic, ongoing, noted on October 2019 lung screening CT.  Continue statin and ASA daily + recommend complete cessation of smoking. 
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
## 2021-11-14 NOTE — Assessment & Plan Note (Signed)
Refer to anxiety plan for updates and discussion. UDS due next 09/14/22. 

## 2021-11-14 NOTE — Assessment & Plan Note (Signed)
Chronic, ongoing, continue daily supplement and adjust as needed. ?

## 2021-11-14 NOTE — Assessment & Plan Note (Signed)
Chronic, ongoing.  Continue Klonopin at this time, patient educated on risks of medication and is not interested in reducing -- refills sent in.  Continue Prozac 40 MG which benefits anxiety and fibromyalgia, discussed with her changing to Zoloft or Duloxetine if increase anxiety/depression noted.  At this time she denies SI/HI and wishes to maintain current regimen.  UDS yearly need - due 09/14/22, contract up to date.  Return in 3 months. ?

## 2021-11-14 NOTE — Assessment & Plan Note (Signed)
Refer to anxiety plan of care. 

## 2021-11-14 NOTE — Assessment & Plan Note (Signed)
I have recommended complete cessation of tobacco use. I have discussed various options available for assistance with tobacco cessation including over the counter methods (Nicotine gum, patch and lozenges). We also discussed prescription options (Chantix, Nicotine Inhaler / Nasal Spray). The patient is not interested in pursuing any prescription tobacco cessation options at this time.  

## 2021-11-14 NOTE — Assessment & Plan Note (Signed)
Chronic, ongoing.  Continue current medication regimen and collaboration with cardiology.  Lipid panel today and CMP. 

## 2021-11-14 NOTE — Assessment & Plan Note (Signed)
Chronic, ongoing.  Continue daily supplement and adjust as needed. ?

## 2021-11-14 NOTE — Assessment & Plan Note (Signed)
Chronic, ongoing.  No current inhaler regimen.  Denies any symptoms.  Continue yearly lung screening and plan on spirometry next visit to assess function.  Recommend complete cessation of smoking.   

## 2021-11-15 LAB — LIPID PANEL W/O CHOL/HDL RATIO
Cholesterol, Total: 148 mg/dL (ref 100–199)
HDL: 62 mg/dL (ref >39)
LDL Chol Calc (NIH): 73 mg/dL (ref 0–99)
Triglycerides: 63 mg/dL (ref 0–149)
VLDL Cholesterol Cal: 13 mg/dL (ref 5–40)

## 2021-11-15 LAB — VITAMIN B12: Vitamin B-12: 284 pg/mL (ref 232–1245)

## 2021-11-15 LAB — COMPREHENSIVE METABOLIC PANEL
ALT: 13 IU/L (ref 0–32)
AST: 24 IU/L (ref 0–40)
Albumin/Globulin Ratio: 2.3 — ABNORMAL HIGH (ref 1.2–2.2)
Albumin: 4.8 g/dL (ref 3.8–4.9)
Alkaline Phosphatase: 89 IU/L (ref 44–121)
BUN/Creatinine Ratio: 9 — ABNORMAL LOW (ref 12–28)
BUN: 8 mg/dL (ref 8–27)
Bilirubin Total: 0.6 mg/dL (ref 0.0–1.2)
CO2: 24 mmol/L (ref 20–29)
Calcium: 9.3 mg/dL (ref 8.7–10.3)
Chloride: 100 mmol/L (ref 96–106)
Creatinine, Ser: 0.92 mg/dL (ref 0.57–1.00)
Globulin, Total: 2.1 g/dL (ref 1.5–4.5)
Potassium: 3.7 mmol/L (ref 3.5–5.2)
Sodium: 139 mmol/L (ref 134–144)
Total Protein: 6.9 g/dL (ref 6.0–8.5)
eGFR: 71 mL/min/{1.73_m2} (ref >59)

## 2021-11-15 LAB — VITAMIN D 25 HYDROXY (VIT D DEFICIENCY, FRACTURES): Vit D, 25-Hydroxy: 14.5 ng/mL — ABNORMAL LOW (ref 30.0–100.0)

## 2021-11-15 NOTE — Progress Notes (Signed)
Good morning crew, please let Lindsey Bautista know her labs have returned: ?- Vitamin D level remains on lower side, please ensure you are taking Vitamin D3 2000 units daily for bone and muscle health.  You can obtain this in vitamin section. ?- Kidney function, creatinine and eGFR, remains normal, as is liver function, AST and ALT.   ?- B12 level remains on low side of normal, this can affect nervous system health.  Please start taking Vitamin B12 1000 MCG daily, which you can obtain in vitamin section. ?- Cholesterol levels are at goal.  Any questions? ?Keep being stellar!!  Thank you for allowing me to participate in your care.  I appreciate you. ?Kindest regards, ?Lindsey Bautista ?

## 2021-12-06 DIAGNOSIS — U071 COVID-19: Secondary | ICD-10-CM | POA: Diagnosis not present

## 2022-01-05 ENCOUNTER — Other Ambulatory Visit: Payer: Self-pay | Admitting: Nurse Practitioner

## 2022-01-05 NOTE — Telephone Encounter (Signed)
Requested medication (s) are due for refill today: expired medications and ordered by different provider  Requested medication (s) are on the active medication list: yes  Last refill:  12/27/20 #90 4 refills- estrace, prozac, linzess. Zetia, lipitor - 05/03/21 #90 3 refills  Future visit scheduled: yes in 1 month  Notes to clinic:  expired medication , zetia and lipitor last ordered by Minna Merritts , MD do you want to renew Rx?     Requested Prescriptions  Pending Prescriptions Disp Refills   ezetimibe (ZETIA) 10 MG tablet [Pharmacy Med Name: Ezetimibe 10 MG Oral Tablet] 90 tablet 0    Sig: Take 1 tablet by mouth once daily     Cardiovascular:  Antilipid - Sterol Transport Inhibitors Failed - 01/05/2022 10:04 AM      Failed - Lipid Panel in normal range within the last 12 months    Cholesterol, Total  Date Value Ref Range Status  11/14/2021 148 100 - 199 mg/dL Final   Cholesterol  Date Value Ref Range Status  11/28/2013 203 (H) 0 - 200 mg/dL Final   Cholesterol Piccolo, Waived  Date Value Ref Range Status  09/02/2018 164 <200 mg/dL Final    Comment:                            Desirable                <200                         Borderline High      200- 239                         High                     >239    Ldl Cholesterol, Calc  Date Value Ref Range Status  11/28/2013 131 (H) 0 - 100 mg/dL Final   LDL Chol Calc (NIH)  Date Value Ref Range Status  11/14/2021 73 0 - 99 mg/dL Final   HDL Cholesterol  Date Value Ref Range Status  11/28/2013 44 40 - 60 mg/dL Final   HDL  Date Value Ref Range Status  11/14/2021 62 >39 mg/dL Final   Triglycerides  Date Value Ref Range Status  11/14/2021 63 0 - 149 mg/dL Final  11/28/2013 140 0 - 200 mg/dL Final   Triglycerides Piccolo,Waived  Date Value Ref Range Status  09/02/2018 76 <150 mg/dL Final    Comment:                            Normal                   <150                         Borderline High     150 -  199                         High                200 - 499                         Very High                >  499          Passed - AST in normal range and within 360 days    AST  Date Value Ref Range Status  11/14/2021 24 0 - 40 IU/L Final   SGOT(AST)  Date Value Ref Range Status  11/27/2013 29 15 - 37 Unit/L Final   AST (SGOT) Piccolo, Waived  Date Value Ref Range Status  09/02/2018 24 11 - 38 U/L Final         Passed - ALT in normal range and within 360 days    ALT  Date Value Ref Range Status  11/14/2021 13 0 - 32 IU/L Final   SGPT (ALT)  Date Value Ref Range Status  11/27/2013 23 12 - 78 U/L Final   ALT (SGPT) Piccolo, Waived  Date Value Ref Range Status  09/02/2018 17 10 - 47 U/L Final         Passed - Patient is not pregnant      Passed - Valid encounter within last 12 months    Recent Outpatient Visits           1 month ago Centrilobular emphysema ([place])   [place] [name], Jolene T, NP   3 months ago Centrilobular emphysema ([place])   [place], Jolene T, NP   3 months ago Depression, recurrent ([place])   [place], Barbaraann Faster, NP   5 months ago [place] [name], [place] T, NP   5 months ago Depression, recurrent ([place])   [place], Barbaraann Faster, NP       Future Appointments             In 1 month [name], [place] T, NP [facility], [facility]              estradiol (ESTRACE) 0.5 MG tablet [Pharmacy Med Name: Estradiol 0.5 MG Oral Tablet] 90 tablet 0    Sig: Take 1 tablet by mouth once daily     OB/GYN:  Estrogens Passed - 01/05/2022 10:04 AM      Passed - Mammogram is up-to-date per Health Maintenance      Passed - Last BP in normal range    BP Readings from Last 1 Encounters:  11/14/21 117/71         Passed - Valid encounter within last 12 months    Recent Outpatient Visits           1 month ago Centrilobular emphysema ([place])    [place] [name], Jolene T, NP   3 months ago Centrilobular emphysema ([place])   [place] [name], [place] T, NP   3 months ago Depression, recurrent ([place])   [place] [name], Barbaraann Faster, NP   5 months ago [place] [name], [place] T, NP   5 months ago Depression, recurrent ([place])   [place], [place] T, NP       Future Appointments             In 1 month [name], Jolene T, NP [place] [facility]              atorvastatin (LIPITOR) 80 MG tablet [Pharmacy Med Name: Atorvastatin Calcium 80 MG Oral Tablet] 90 tablet 0    Sig: Take 1 tablet by mouth once daily     Cardiovascular:  Antilipid - Statins Failed - 01/05/2022 10:04 AM      Failed -  Lipid Panel in normal range within the last 12 months    Cholesterol, Total  Date Value Ref Range Status  11/14/2021 148 100 - 199 mg/dL Final   Cholesterol  Date Value Ref Range Status  11/28/2013 203 (H) 0 - 200 mg/dL Final   Cholesterol Piccolo, Waived  Date Value Ref Range Status  09/02/2018 164 <200 mg/dL Final    Comment:                            Desirable                <200                         Borderline High      200- 239                         High                     >239    Ldl Cholesterol, Calc  Date Value Ref Range Status  11/28/2013 131 (H) 0 - 100 mg/dL Final   LDL Chol Calc (NIH)  Date Value Ref Range Status  11/14/2021 73 0 - 99 mg/dL Final   HDL Cholesterol  Date Value Ref Range Status  11/28/2013 44 40 - 60 mg/dL Final   HDL  Date Value Ref Range Status  11/14/2021 62 >39 mg/dL Final   Triglycerides  Date Value Ref Range Status  11/14/2021 63 0 - 149 mg/dL Final  11/28/2013 140 0 - 200 mg/dL Final   Triglycerides Piccolo,Waived  Date Value Ref Range Status  09/02/2018 76 <150 mg/dL Final    Comment:                            Normal                   <150                         Borderline  High     150 - 199                         High                200 - 499                         Very High                >499          Passed - Patient is not pregnant      Passed - Valid encounter within last 12 months    Recent Outpatient Visits           1 month ago Centrilobular emphysema ([place])   [place] [name], Jolene T, NP   3 months ago Centrilobular emphysema ([place])   [place], [place] T, NP   3 months ago Depression, recurrent ([place])   [place], Barbaraann Faster, NP   5 months ago [place] [name], [place] T, NP   5 months ago Depression, recurrent [facility])   [facility] Champion, [place] T,  NP       Future Appointments             In 1 month [name], Jolene T, NP [facility], [facility]              FLUoxetine (PROZAC) 40 MG capsule [Pharmacy Med Name: FLUoxetine HCl 40 MG Oral Capsule] 90 capsule 0    Sig: Take 1 capsule by mouth once daily     Psychiatry:  Antidepressants - SSRI Passed - 01/05/2022 10:04 AM      Passed - Completed PHQ-2 or PHQ-9 in the last 360 days      Passed - Valid encounter within last 6 months    Recent Outpatient Visits           1 month ago Centrilobular emphysema ([place])   [place] [name], Jolene T, NP   3 months ago Centrilobular emphysema ([place])   [place] [name], Jolene T, NP   3 months ago Depression, recurrent ([place])   [place], Barbaraann Faster, NP   5 months ago [place] [name], [place] T, NP   5 months ago Depression, recurrent ([place])   [place], Barbaraann Faster, NP       Future Appointments             In 1 month [name], Barbaraann Faster, NP [facility], [facility]              LINZESS 72 MCG capsule [employer] Med Name: Linzess 72 MCG Oral Capsule] 90 capsule 0    Sig: TAKE 1 [place]      Gastroenterology: Irritable Bowel Syndrome Passed - 01/05/2022 10:04 AM      Passed - Valid encounter within last 12 months    Recent Outpatient Visits           1 month ago Centrilobular emphysema ([place])   [place] [name], Jolene T, NP   3 months ago Centrilobular emphysema ([place])   [place] [name], Jolene T, NP   3 months ago Depression, recurrent ([place])   [place] [name], Barbaraann Faster, NP   5 months ago [place] [name], [place] T, NP   5 months ago Depression, recurrent ([place])   [place], Barbaraann Faster, NP       Future Appointments             In 1 month [name], Barbaraann Faster, NP [facility], [facility]

## 2022-01-06 ENCOUNTER — Other Ambulatory Visit: Payer: Self-pay | Admitting: Nurse Practitioner

## 2022-01-06 DIAGNOSIS — F419 Anxiety disorder, unspecified: Secondary | ICD-10-CM

## 2022-01-06 NOTE — Telephone Encounter (Signed)
Requested medication (s) are due for refill today: yes- prozac and estrace, expired medications lipitor, zetia  Requested medication (s) are on the active medication list: yes  Last refill:  expired medications- prozac 12/27/20 #90 4 refills estrace 12/27/20 #90 4 refills, lipitor 05/03/21 #90 3 refills , zetia 05/03/21 #90 3 refills  Future visit scheduled: yes in 1 month   Notes to clinic:  expired medications Prozac, estrace do you want to renew Rx? Lipitor , zetia last ordered by Tanda Rockers, MD do you want to order Rx?     Requested Prescriptions  Pending Prescriptions Disp Refills   atorvastatin (LIPITOR) 80 MG tablet [Pharmacy Med Name: Atorvastatin Calcium 80 MG Oral Tablet] 90 tablet 0    Sig: Take 1 tablet by mouth once daily     Cardiovascular:  Antilipid - Statins Failed - 01/06/2022  3:59 PM      Failed - Lipid Panel in normal range within the last 12 months    Cholesterol, Total  Date Value Ref Range Status  11/14/2021 148 100 - 199 mg/dL Final   Cholesterol  Date Value Ref Range Status  11/28/2013 203 (H) 0 - 200 mg/dL Final   Cholesterol Piccolo, Waived  Date Value Ref Range Status  09/02/2018 164 <200 mg/dL Final    Comment:                            Desirable                <200                         Borderline High      200- 239                         High                     >239    Ldl Cholesterol, Calc  Date Value Ref Range Status  11/28/2013 131 (H) 0 - 100 mg/dL Final   LDL Chol Calc (NIH)  Date Value Ref Range Status  11/14/2021 73 0 - 99 mg/dL Final   HDL Cholesterol  Date Value Ref Range Status  11/28/2013 44 40 - 60 mg/dL Final   HDL  Date Value Ref Range Status  11/14/2021 62 >39 mg/dL Final   Triglycerides  Date Value Ref Range Status  11/14/2021 63 0 - 149 mg/dL Final  11/28/2013 140 0 - 200 mg/dL Final   Triglycerides Piccolo,Waived  Date Value Ref Range Status  09/02/2018 76 <150 mg/dL Final    Comment:                             Normal                   <150                         Borderline High     150 - 199                         High                200 - 499  Very High                >499          Passed - Patient is not pregnant      Passed - Valid encounter within last 12 months    Recent Outpatient Visits           1 month ago Centrilobular emphysema ([place])   [place], Henrine Screws T, NP   3 months ago Centrilobular emphysema ([place])   [place], Henrine Screws T, NP   3 months ago Depression, recurrent ([place])   [place], Barbaraann Faster, NP   5 months ago [place], [place] T, NP   5 months ago Depression, recurrent ([place])   [place], Henrine Screws T, NP       Future Appointments             In 1 month [name], Jolene T, NP [facility], [facility]              FLUoxetine (PROZAC) 40 MG capsule [Pharmacy Med Name: FLUoxetine HCl 40 MG Oral Capsule] 90 capsule 0    Sig: Take 1 capsule by mouth once daily     Psychiatry:  Antidepressants - SSRI Passed - 01/06/2022  3:59 PM      Passed - Completed PHQ-2 or PHQ-9 in the last 360 days      Passed - Valid encounter within last 6 months    Recent Outpatient Visits           1 month ago Centrilobular emphysema ([place])   [place] [name], Jolene T, NP   3 months ago Centrilobular emphysema ([place])   [place], Jolene T, NP   3 months ago Depression, recurrent ([place])   [place], Barbaraann Faster, NP   5 months ago [place] [name], [place] T, NP   5 months ago Depression, recurrent ([place])   [place], Barbaraann Faster, NP       Future Appointments             In 1 month [name], Barbaraann Faster, NP [facility], [facility]              LINZESS 72 MCG capsule [employer] Med Name: Linzess 72 MCG Oral Capsule] 90  capsule 0    Sig: TAKE 1 [place]     Gastroenterology: Irritable Bowel Syndrome Passed - 01/06/2022  3:59 PM      Passed - Valid encounter within last 12 months    Recent Outpatient Visits           1 month ago Centrilobular emphysema ([place])   [place] [name], Jolene T, NP   3 months ago Centrilobular emphysema ([place])   [place] [name], Jolene T, NP   3 months ago Depression, recurrent ([place])   [place], Barbaraann Faster, NP   5 months ago [place] [name], [place] T, NP   5 months ago Depression, recurrent ([place])   [place], Barbaraann Faster, NP       Future Appointments             In 1 month [name], Barbaraann Faster, NP [facility], [facility]              ezetimibe (ZETIA) 10  MG tablet [Pharmacy Med Name: Ezetimibe 10 MG Oral Tablet] 90 tablet 0    Sig: Take 1 tablet by mouth once daily     Cardiovascular:  Antilipid - Sterol Transport Inhibitors Failed - 01/06/2022  3:59 PM      Failed - Lipid Panel in normal range within the last 12 months    Cholesterol, Total  Date Value Ref Range Status  11/14/2021 148 100 - 199 mg/dL Final   Cholesterol  Date Value Ref Range Status  11/28/2013 203 (H) 0 - 200 mg/dL Final   Cholesterol Piccolo, Waived  Date Value Ref Range Status  09/02/2018 164 <200 mg/dL Final    Comment:                            Desirable                <200                         Borderline High      200- 239                         High                     >239    Ldl Cholesterol, Calc  Date Value Ref Range Status  11/28/2013 131 (H) 0 - 100 mg/dL Final   LDL Chol Calc (NIH)  Date Value Ref Range Status  11/14/2021 73 0 - 99 mg/dL Final   HDL Cholesterol  Date Value Ref Range Status  11/28/2013 44 40 - 60 mg/dL Final   HDL  Date Value Ref Range Status  11/14/2021 62 >39 mg/dL Final   Triglycerides  Date Value  Ref Range Status  11/14/2021 63 0 - 149 mg/dL Final  11/28/2013 140 0 - 200 mg/dL Final   Triglycerides Piccolo,Waived  Date Value Ref Range Status  09/02/2018 76 <150 mg/dL Final    Comment:                            Normal                   <150                         Borderline High     150 - 199                         High                200 - 499                         Very High                >499          Passed - AST in normal range and within 360 days    AST  Date Value Ref Range Status  11/14/2021 24 0 - 40 IU/L Final   SGOT(AST)  Date Value Ref Range Status  11/27/2013 29 15 - 37 Unit/L Final   AST (SGOT) Piccolo, Waived  Date Value Ref Range Status  09/02/2018 24 11 - 38 U/L Final  Passed - ALT in normal range and within 360 days    ALT  Date Value Ref Range Status  11/14/2021 13 0 - 32 IU/L Final   SGPT (ALT)  Date Value Ref Range Status  11/27/2013 23 12 - 78 U/L Final   ALT (SGPT) Piccolo, Waived  Date Value Ref Range Status  09/02/2018 17 10 - 47 U/L Final         Passed - Patient is not pregnant      Passed - Valid encounter within last 12 months    Recent Outpatient Visits           1 month ago Centrilobular emphysema ([place])   [place], Jolene T, NP   3 months ago Centrilobular emphysema ([place])   [place], Jolene T, NP   3 months ago Depression, recurrent ([place])   [place], Barbaraann Faster, NP   5 months ago [place] [name], [place] T, NP   5 months ago Depression, recurrent ([place])   [place], Barbaraann Faster, NP       Future Appointments             In 1 month [name], [place] T, NP [facility], [facility]              estradiol (ESTRACE) 0.5 MG tablet [Pharmacy Med Name: Estradiol 0.5 MG Oral Tablet] 90 tablet 0    Sig: Take 1 tablet by mouth once daily     OB/GYN:  Estrogens Passed - 01/06/2022  3:59 PM       Passed - Mammogram is up-to-date per Health Maintenance      Passed - Last BP in normal range    BP Readings from Last 1 Encounters:  11/14/21 117/71         Passed - Valid encounter within last 12 months    Recent Outpatient Visits           1 month ago Centrilobular emphysema ([place])   [place] [name], Jolene T, NP   3 months ago Centrilobular emphysema ([place])   [place] [name], Jolene T, NP   3 months ago Depression, recurrent ([place])   [place] [name], Barbaraann Faster, NP   5 months ago [place] [name], [place] T, NP   5 months ago Depression, recurrent ([place])   [place], Barbaraann Faster, NP       Future Appointments             In 1 month [name], Barbaraann Faster, NP [facility], [facility]

## 2022-01-06 NOTE — Telephone Encounter (Signed)
Copied from [place] [phone]. Topic: General - Other >> Jan 06, 2022  3:27 PM Tessa Lerner A wrote: Reason for CRM: Medication Refill - Medication: clonazePAM (KLONOPIN) 0.5 MG tablet [[id number]]   ezetimibe (ZETIA) 10 MG tablet [[id number]]  estradiol (ESTRACE) 0.5 MG tablet [[id number]]   FLUoxetine (PROZAC) 40 MG capsule [[id number]]   linaclotide (LINZESS) 72 MCG capsule [[id number]]   atorvastatin (LIPITOR) 80 MG tablet [[id number]]   Has the patient contacted their pharmacy? Yes.  The patient has been directed to contact their PCP. The patient has had significant difficulty refilling their prescriptions previously  (Agent: If no, request that the patient contact the pharmacy for the refill. If patient does not wish to contact the pharmacy document the reason why and proceed with request.) (Agent: If yes, when and what did the pharmacy advise?)  Preferred Pharmacy (with phone number or street name): [place] [facility] (N[facility] [address] [address] [place] ([place]) [place] [address] Phone: [phone] Fax: [phone] Hours: Not open 24 hours  Has the patient been seen for an appointment in the last year OR does the patient have an upcoming appointment? Yes.    Agent: Please be advised that RX refills may take up to 3 business days. We ask that you follow-up with your pharmacy.

## 2022-01-09 NOTE — Telephone Encounter (Signed)
Requested medication (s) are due for refill today:   Requested medication (s) are on the active medication list: Yes  Last refill:  estradiol,prozac,Linzess have expired. Klonopin -11/07/21 - not delegated.  Future visit scheduled: Yes  Notes to clinic:  Dr. Rockey Situ last filled atorvastatin, Zetia. Does PCP want to refill.    Requested Prescriptions  Pending Prescriptions Disp Refills   clonazePAM (KLONOPIN) 0.5 MG tablet 120 tablet 0    Sig: Take 2 tablets (1 mg total) by mouth 2 (two) times daily as needed for anxiety.     Not Delegated - Psychiatry: Anxiolytics/Hypnotics 2 Failed - 01/06/2022  3:59 PM      Failed - This refill cannot be delegated      Passed - Urine Drug Screen completed in last 360 days      Passed - Patient is not pregnant      Passed - Valid encounter within last 6 months    Recent Outpatient Visits           1 month ago Centrilobular emphysema ([place])   [place] [name], Jolene T, NP   3 months ago Centrilobular emphysema ([place])   [place], Jolene T, NP   4 months ago Depression, recurrent ([place])   [place], Barbaraann Faster, NP   5 months ago [place], [place] T, NP   6 months ago Depression, recurrent ([place])   [place], Driggs T, NP       Future Appointments             In 1 month [name], Jolene T, NP [facility], [facility]              estradiol (ESTRACE) 0.5 MG tablet 90 tablet 4    Sig: Take 1 tablet (0.5 mg total) by mouth daily.     OB/GYN:  Estrogens Passed - 01/06/2022  3:59 PM      Passed - Mammogram is up-to-date per Health Maintenance      Passed - Last BP in normal range    BP Readings from Last 1 Encounters:  11/14/21 117/71         Passed - Valid encounter within last 12 months    Recent Outpatient Visits           1 month ago Centrilobular emphysema ([place])   [place], Henrine Screws T,  NP   3 months ago Centrilobular emphysema ([place])   [place] [name], Jolene T, NP   4 months ago Depression, recurrent ([place])   [place], Barbaraann Faster, NP   5 months ago [place] [name], [place] T, NP   6 months ago Depression, recurrent ([place])   [place], [place]th[place] T, NP       Future Appointments             In 1 month [name], Barbaraann Faster, NP [place]              ezetimibe (ZETIA) 10 MG tablet 90 tablet 3    Sig: Take 1 tablet (10 mg total) by mouth daily.     Cardiovascular:  Antilipid - Sterol Transport Inhibitors Failed - 01/06/2022  3:59 PM      Failed - Lipid Panel in normal range within the last 12 months    Cholesterol, Total  Date Value Ref Range Status  11/14/2021 148 100 -  199 mg/dL Final   Cholesterol  Date Value Ref Range Status  11/28/2013 203 (H) 0 - 200 mg/dL Final   Cholesterol Piccolo, Waived  Date Value Ref Range Status  09/02/2018 164 <200 mg/dL Final    Comment:                            Desirable                <200                         Borderline High      200- 239                         High                     >239    Ldl Cholesterol, Calc  Date Value Ref Range Status  11/28/2013 131 (H) 0 - 100 mg/dL Final   LDL Chol Calc (NIH)  Date Value Ref Range Status  11/14/2021 73 0 - 99 mg/dL Final   HDL Cholesterol  Date Value Ref Range Status  11/28/2013 44 40 - 60 mg/dL Final   HDL  Date Value Ref Range Status  11/14/2021 62 >39 mg/dL Final   Triglycerides  Date Value Ref Range Status  11/14/2021 63 0 - 149 mg/dL Final  11/28/2013 140 0 - 200 mg/dL Final   Triglycerides Piccolo,Waived  Date Value Ref Range Status  09/02/2018 76 <150 mg/dL Final    Comment:                            Normal                   <150                         Borderline High     150 - 199                         High                200 - 499                          Very High                >499          Passed - AST in normal range and within 360 days    AST  Date Value Ref Range Status  11/14/2021 24 0 - 40 IU/L Final   SGOT(AST)  Date Value Ref Range Status  11/27/2013 29 15 - 37 Unit/L Final   AST (SGOT) Piccolo, Waived  Date Value Ref Range Status  09/02/2018 24 11 - 38 U/L Final         Passed - ALT in normal range and within 360 days    ALT  Date Value Ref Range Status  11/14/2021 13 0 - 32 IU/L Final   SGPT (ALT)  Date Value Ref Range Status  11/27/2013 23 12 - 78 U/L Final   ALT (SGPT) Piccolo, Waived  Date Value Ref Range Status  09/02/2018 17 10 - 47 U/L Final  Passed - Patient is not pregnant      Passed - Valid encounter within last 12 months    Recent Outpatient Visits           1 month ago Centrilobular emphysema ([place])   [place] [name], Henrine Screws T, NP   3 months ago Centrilobular emphysema ([place])   [place], Jolene T, NP   4 months ago Depression, recurrent ([place])   [place], Barbaraann Faster, NP   5 months ago [place], [place] T, NP   6 months ago Depression, recurrent ([place])   [place], Henrine Screws T, NP       Future Appointments             In 1 month [name], Barbaraann Faster, NP [facility]              atorvastatin (LIPITOR) 80 MG tablet 90 tablet 3    Sig: Take 1 tablet (80 mg total) by mouth daily.     Cardiovascular:  Antilipid - Statins Failed - 01/06/2022  3:59 PM      Failed - Lipid Panel in normal range within the last 12 months    Cholesterol, Total  Date Value Ref Range Status  11/14/2021 148 100 - 199 mg/dL Final   Cholesterol  Date Value Ref Range Status  11/28/2013 203 (H) 0 - 200 mg/dL Final   Cholesterol Piccolo, Waived  Date Value Ref Range Status  09/02/2018 164 <200 mg/dL Final    Comment:                            Desirable                 <200                         Borderline High      200- 239                         High                     >239    Ldl Cholesterol, Calc  Date Value Ref Range Status  11/28/2013 131 (H) 0 - 100 mg/dL Final   LDL Chol Calc (NIH)  Date Value Ref Range Status  11/14/2021 73 0 - 99 mg/dL Final   HDL Cholesterol  Date Value Ref Range Status  11/28/2013 44 40 - 60 mg/dL Final   HDL  Date Value Ref Range Status  11/14/2021 62 >39 mg/dL Final   Triglycerides  Date Value Ref Range Status  11/14/2021 63 0 - 149 mg/dL Final  11/28/2013 140 0 - 200 mg/dL Final   Triglycerides Piccolo,Waived  Date Value Ref Range Status  09/02/2018 76 <150 mg/dL Final    Comment:                            Normal                   <150                         Borderline High     150 - 199  High                200 - 499                         Very High                >499          Passed - Patient is not pregnant      Passed - Valid encounter within last 12 months    Recent Outpatient Visits           1 month ago Centrilobular emphysema ([place])   [place] [name], Jolene T, NP   3 months ago Centrilobular emphysema ([place])   [place], Jolene T, NP   4 months ago Depression, recurrent ([place])   [place], Barbaraann Faster, NP   5 months ago [place], [place] T, NP   6 months ago Depression, recurrent ([place])   [place], Camilla T, NP       Future Appointments             In 1 month [name], Jolene T, NP [facility], [facility]              FLUoxetine (PROZAC) 40 MG capsule 90 capsule 4    Sig: Take 1 capsule (40 mg total) by mouth daily.     Psychiatry:  Antidepressants - SSRI Passed - 01/06/2022  3:59 PM      Passed - Completed PHQ-2 or PHQ-9 in the last 360 days      Passed - Valid encounter within last 6 months    Recent  Outpatient Visits           1 month ago Centrilobular emphysema ([place])   [place] [name], Henrine Screws T, NP   3 months ago Centrilobular emphysema ([place])   [place] [name], Jolene T, NP   4 months ago Depression, recurrent ([place])   [place], Barbaraann Faster, NP   5 months ago [place] [name], [place] T, NP   6 months ago Depression, recurrent ([place])   [place], Barbaraann Faster, NP       Future Appointments             In 1 month [name], Barbaraann Faster, NP [facility], [facility]              linaclotide (LINZESS) 72 MCG capsule 90 capsule 4    Sig: TAKE 1 CAPSULE BY MOUTH ONCE DAILY BEFORE BREAKFAST     Gastroenterology: Irritable Bowel Syndrome Passed - 01/06/2022  3:59 PM      Passed - Valid encounter within last 12 months    Recent Outpatient Visits           1 month ago Centrilobular emphysema ([place])   [place] [name], Jolene T, NP   3 months ago Centrilobular emphysema ([place])   [place] [name], Jolene T, NP   4 months ago Depression, recurrent ([place])   [place] [name], Barbaraann Faster, NP   5 months ago [place] [name], [place] T, NP   6 months ago Depression, recurrent ([place])   [place], Jolene T, NP       Future Appointments  In 1 month [name], Barbaraann Faster, NP [facility], [facility]

## 2022-01-10 ENCOUNTER — Other Ambulatory Visit: Payer: Self-pay | Admitting: Nurse Practitioner

## 2022-01-11 NOTE — Telephone Encounter (Signed)
All 3 refilled at [facility] 01/09/2022 #90 4 refills - receipt confirmed. Requested Prescriptions  Pending Prescriptions Disp Refills  . atorvastatin (LIPITOR) 80 MG tablet [Pharmacy Med Name: Atorvastatin Calcium 80 MG Oral Tablet] 90 tablet 0    Sig: Take 1 tablet by mouth once daily     Cardiovascular:  Antilipid - Statins Failed - 01/10/2022 11:46 AM      Failed - Lipid Panel in normal range within the last 12 months    Cholesterol, Total  Date Value Ref Range Status  11/14/2021 148 100 - 199 mg/dL Final   Cholesterol  Date Value Ref Range Status  11/28/2013 203 (H) 0 - 200 mg/dL Final   Cholesterol Piccolo, Waived  Date Value Ref Range Status  09/02/2018 164 <200 mg/dL Final    Comment:                            Desirable                <200                         Borderline High      200- 239                         High                     >239    Ldl Cholesterol, Calc  Date Value Ref Range Status  11/28/2013 131 (H) 0 - 100 mg/dL Final   LDL Chol Calc (NIH)  Date Value Ref Range Status  11/14/2021 73 0 - 99 mg/dL Final   HDL Cholesterol  Date Value Ref Range Status  11/28/2013 44 40 - 60 mg/dL Final   HDL  Date Value Ref Range Status  11/14/2021 62 >39 mg/dL Final   Triglycerides  Date Value Ref Range Status  11/14/2021 63 0 - 149 mg/dL Final  11/28/2013 140 0 - 200 mg/dL Final   Triglycerides Piccolo,Waived  Date Value Ref Range Status  09/02/2018 76 <150 mg/dL Final    Comment:                            Normal                   <150                         Borderline High     150 - 199                         High                200 - 499                         Very High                >499          Passed - Patient is not pregnant      Passed - Valid encounter within last 12 months    Recent Outpatient Visits          1 month ago Centrilobular emphysema ([place])   [place],  Barbaraann Faster, NP   3 months ago Centrilobular  emphysema ([place])   [place], [place] T, NP   4 months ago Depression, recurrent ([place])   [place], Barbaraann Faster, NP   5 months ago [place] [name], [place] T, NP   6 months ago Depression, recurrent ([place])   [place], Barbaraann Faster, NP      Future Appointments            In 1 month [name], Barbaraann Faster, NP [facility], [facility]           . FLUoxetine (PROZAC) 40 MG capsule [Pharmacy Med Name: FLUoxetine HCl 40 MG Oral Capsule] 90 capsule 0    Sig: Take 1 capsule by mouth once daily     Psychiatry:  Antidepressants - SSRI Passed - 01/10/2022 11:46 AM      Passed - Completed PHQ-2 or PHQ-9 in the last 360 days      Passed - Valid encounter within last 6 months    Recent Outpatient Visits          1 month ago Centrilobular emphysema ([place])   [place] [name], Jolene T, NP   3 months ago Centrilobular emphysema ([place])   [place] [name], Jolene T, NP   4 months ago Depression, recurrent ([place])   [place], Barbaraann Faster, NP   5 months ago [place] [name], [place] T, NP   6 months ago Depression, recurrent ([place])   [place], Barbaraann Faster, NP      Future Appointments            In 1 month [name], Barbaraann Faster, NP [facility], [place] 72 MCG capsule [Pharmacy Med Name: Linzess 72 MCG Oral Capsule] 90 capsule 0    Sig: TAKE 1 CAPSULE BY [place]     Gastroenterology: Irritable Bowel Syndrome Passed - 01/10/2022 11:46 AM      Passed - Valid encounter within last 12 months    Recent Outpatient Visits          1 month ago Centrilobular emphysema ([place])   [place] [name], Jolene T, NP   3 months ago Centrilobular emphysema ([place])   [place] [name], Jolene T, NP   4 months ago Depression, recurrent ([place])   [place] [name], Barbaraann Faster, NP   5 months ago [place] [name], [place] T, NP   6 months ago Depression, recurrent ([place])   [place], Barbaraann Faster, NP      Future Appointments            In 1 month [name], Barbaraann Faster, NP [facility], [facility]

## 2022-02-05 NOTE — Patient Instructions (Incomplete)

## 2022-02-10 ENCOUNTER — Ambulatory Visit (INDEPENDENT_AMBULATORY_CARE_PROVIDER_SITE_OTHER): Payer: Medicare Other | Admitting: Nurse Practitioner

## 2022-02-10 ENCOUNTER — Encounter: Payer: Self-pay | Admitting: Nurse Practitioner

## 2022-02-10 VITALS — BP 136/78 | HR 68 | Temp 98.2°F | Ht 61.0 in | Wt 146.4 lb

## 2022-02-10 DIAGNOSIS — I739 Peripheral vascular disease, unspecified: Secondary | ICD-10-CM

## 2022-02-10 DIAGNOSIS — M2550 Pain in unspecified joint: Secondary | ICD-10-CM | POA: Diagnosis not present

## 2022-02-10 DIAGNOSIS — E538 Deficiency of other specified B group vitamins: Secondary | ICD-10-CM | POA: Diagnosis not present

## 2022-02-10 DIAGNOSIS — B9689 Other specified bacterial agents as the cause of diseases classified elsewhere: Secondary | ICD-10-CM

## 2022-02-10 DIAGNOSIS — F419 Anxiety disorder, unspecified: Secondary | ICD-10-CM | POA: Diagnosis not present

## 2022-02-10 DIAGNOSIS — I700 Atherosclerosis of aorta: Secondary | ICD-10-CM | POA: Diagnosis not present

## 2022-02-10 DIAGNOSIS — M797 Fibromyalgia: Secondary | ICD-10-CM

## 2022-02-10 DIAGNOSIS — I2510 Atherosclerotic heart disease of native coronary artery without angina pectoris: Secondary | ICD-10-CM

## 2022-02-10 DIAGNOSIS — E782 Mixed hyperlipidemia: Secondary | ICD-10-CM | POA: Diagnosis not present

## 2022-02-10 DIAGNOSIS — F1320 Sedative, hypnotic or anxiolytic dependence, uncomplicated: Secondary | ICD-10-CM

## 2022-02-10 DIAGNOSIS — D518 Other vitamin B12 deficiency anemias: Secondary | ICD-10-CM | POA: Diagnosis not present

## 2022-02-10 DIAGNOSIS — E559 Vitamin D deficiency, unspecified: Secondary | ICD-10-CM

## 2022-02-10 DIAGNOSIS — F339 Major depressive disorder, recurrent, unspecified: Secondary | ICD-10-CM | POA: Diagnosis not present

## 2022-02-10 DIAGNOSIS — N760 Acute vaginitis: Secondary | ICD-10-CM

## 2022-02-10 DIAGNOSIS — Z91018 Allergy to other foods: Secondary | ICD-10-CM

## 2022-02-10 DIAGNOSIS — F17210 Nicotine dependence, cigarettes, uncomplicated: Secondary | ICD-10-CM

## 2022-02-10 DIAGNOSIS — J432 Centrilobular emphysema: Secondary | ICD-10-CM

## 2022-02-10 DIAGNOSIS — F447 Conversion disorder with mixed symptom presentation: Secondary | ICD-10-CM

## 2022-02-10 LAB — URINALYSIS, ROUTINE W REFLEX MICROSCOPIC
Bilirubin, UA: NEGATIVE
Glucose, UA: NEGATIVE
Ketones, UA: NEGATIVE
Leukocytes,UA: NEGATIVE
Nitrite, UA: NEGATIVE
Protein,UA: NEGATIVE
RBC, UA: NEGATIVE
Specific Gravity, UA: 1.02 (ref 1.005–1.030)
Urobilinogen, Ur: 0.2 mg/dL (ref 0.2–1.0)
pH, UA: 6.5 (ref 5.0–7.5)

## 2022-02-10 LAB — WET PREP FOR TRICH, YEAST, CLUE
Clue Cell Exam: NEGATIVE
Trichomonas Exam: NEGATIVE
Yeast Exam: NEGATIVE

## 2022-02-10 MED ORDER — GABAPENTIN 600 MG PO TABS: 300.0000 mg | ORAL_TABLET | Freq: Every day | ORAL | 4 refills | Status: DC

## 2022-02-10 NOTE — Assessment & Plan Note (Signed)
Refer to anxiety plan for updates and discussion. UDS due next 09/14/22. 

## 2022-02-10 NOTE — Assessment & Plan Note (Signed)
Chronic, stable.  Continue current medication regimen and collaboration with cardiology. 

## 2022-02-10 NOTE — Assessment & Plan Note (Signed)
Chronic, ongoing, noted on October 2019 lung screening CT.  Continue statin and ASA daily + recommend complete cessation of smoking. 

## 2022-02-10 NOTE — Assessment & Plan Note (Signed)
Chronic, ongoing.  Continue Klonopin at this time, patient educated on risks of medication and is not interested in reducing -- refills sent in.  Continue Prozac 40 MG which benefits anxiety and fibromyalgia, discussed with her changing to Zoloft or Duloxetine (which would benefit chronic pain issues) if increase anxiety/depression noted.  At this time she denies SI/HI and wishes to maintain current regimen.  UDS yearly need - due 09/14/22, contract up to date.  Return in 3 months.

## 2022-02-11 LAB — SEDIMENTATION RATE: Sed Rate: 8 mm/hr (ref 0–40)

## 2022-02-14 DIAGNOSIS — Z91018 Allergy to other foods: Secondary | ICD-10-CM | POA: Insufficient documentation

## 2022-02-14 DIAGNOSIS — H2513 Age-related nuclear cataract, bilateral: Secondary | ICD-10-CM | POA: Diagnosis not present

## 2022-02-21 ENCOUNTER — Encounter: Payer: Self-pay | Admitting: Nurse Practitioner

## 2022-02-21 NOTE — Progress Notes (Signed)
Please let Lindsey Bautista know her ANA did return positive, I have the rheumatology referral in place and do want her to ensure to schedule with them.  She could even consider calling [facility] rheumatology to schedule.  This way she can get further assessment of her joint pains.  Have a great day!!

## 2022-02-22 LAB — ANA 12 PLUS PROFILE (RDL): Anti-Nuclear Ab by IFA (RDL): POSITIVE — AB

## 2022-02-22 LAB — CBC WITH DIFFERENTIAL/PLATELET
Basophils Absolute: 0.1 10*3/uL (ref 0.0–0.2)
Basos: 1 %
EOS (ABSOLUTE): 0.1 10*3/uL (ref 0.0–0.4)
Eos: 2 %
Hematocrit: 40.3 % (ref 34.0–46.6)
Hemoglobin: 13.2 g/dL (ref 11.1–15.9)
Immature Grans (Abs): 0 10*3/uL (ref 0.0–0.1)
Immature Granulocytes: 0 %
Lymphocytes Absolute: 2.1 10*3/uL (ref 0.7–3.1)
Lymphs: 35 %
MCH: 29.7 pg (ref 26.6–33.0)
MCHC: 32.8 g/dL (ref 31.5–35.7)
MCV: 91 fL (ref 79–97)
Monocytes Absolute: 0.5 10*3/uL (ref 0.1–0.9)
Monocytes: 8 %
Neutrophils Absolute: 3.3 10*3/uL (ref 1.4–7.0)
Neutrophils: 54 %
Platelets: 264 10*3/uL (ref 150–450)
RBC: 4.45 x10E6/uL (ref 3.77–5.28)
RDW: 12.8 % (ref 11.7–15.4)
WBC: 6.1 10*3/uL (ref 3.4–10.8)

## 2022-02-22 LAB — ALPHA-GAL PANEL
Allergen Lamb IgE: 1.04 kU/L — AB
Beef IgE: 1.41 kU/L — AB
IgE (Immunoglobulin E), Serum: 126 IU/mL (ref 6–495)
O215-IgE Alpha-Gal: 4.75 kU/L — AB
Pork IgE: 1.06 kU/L — AB

## 2022-02-22 LAB — ANA 12 PLUS PROFILE, POSITIVE
Anti-CCP Ab, IgG & IgA (RDL): <20 Units (ref <20)
Anti-Cardiolipin Ab, IgA (RDL): <12 APL U/mL (ref <12)
Anti-Cardiolipin Ab, IgG (RDL): <15 GPL U/mL (ref <15)
Anti-Cardiolipin Ab, IgM (RDL): <13 MPL U/mL (ref <13)
Anti-Centromere Ab (RDL): <1:40 {titer}
Anti-Chromatin Ab, IgG (RDL): <20 Units (ref <20)
Anti-La (SS-B) Ab (RDL): <20 Units (ref <20)
Anti-Ro (SS-A) Ab (RDL): <20 Units (ref <20)
Anti-Scl-70 Ab (RDL): <20 Units (ref <20)
Anti-Sm Ab (RDL): <20 Units (ref <20)
Anti-TPO Ab (RDL): <9 IU/mL (ref <9.0)
Anti-U1 RNP Ab (RDL): <20 Units (ref <20)
Anti-dsDNA Ab by Farr(RDL): <8 IU/mL (ref <8.0)
C3 Complement (RDL): 154 mg/dL (ref 82–167)
C4 Complement (RDL): 27 mg/dL (ref 14–44)
Homogeneous Pattern: 1:160 {titer} — ABNORMAL HIGH
Rheumatoid Factor by Turb RDL: <14 IU/mL (ref <14)

## 2022-02-22 LAB — COMPREHENSIVE METABOLIC PANEL
ALT: 15 IU/L (ref 0–32)
AST: 22 IU/L (ref 0–40)
Albumin/Globulin Ratio: 1.9 (ref 1.2–2.2)
Albumin: 4.3 g/dL (ref 3.8–4.9)
Alkaline Phosphatase: 88 IU/L (ref 44–121)
BUN/Creatinine Ratio: 7 — ABNORMAL LOW (ref 12–28)
BUN: 7 mg/dL — ABNORMAL LOW (ref 8–27)
Bilirubin Total: 0.3 mg/dL (ref 0.0–1.2)
CO2: 22 mmol/L (ref 20–29)
Calcium: 9.4 mg/dL (ref 8.7–10.3)
Chloride: 102 mmol/L (ref 96–106)
Creatinine, Ser: 0.97 mg/dL (ref 0.57–1.00)
Globulin, Total: 2.3 g/dL (ref 1.5–4.5)
Glucose: 75 mg/dL (ref 70–99)
Potassium: 4.2 mmol/L (ref 3.5–5.2)
Sodium: 140 mmol/L (ref 134–144)
Total Protein: 6.6 g/dL (ref 6.0–8.5)
eGFR: 67 mL/min/{1.73_m2} (ref >59)

## 2022-02-22 LAB — LIPID PANEL W/O CHOL/HDL RATIO
Cholesterol, Total: 150 mg/dL (ref 100–199)
HDL: 58 mg/dL (ref >39)
LDL Chol Calc (NIH): 80 mg/dL (ref 0–99)
Triglycerides: 59 mg/dL (ref 0–149)
VLDL Cholesterol Cal: 12 mg/dL (ref 5–40)

## 2022-02-22 LAB — IRON AND TIBC
Iron Saturation: 12 % — ABNORMAL LOW (ref 15–55)
Iron: 46 ug/dL (ref 27–159)
Total Iron Binding Capacity: 388 ug/dL (ref 250–450)
UIBC: 342 ug/dL (ref 131–425)

## 2022-02-22 LAB — FERRITIN: Ferritin: 15 ng/mL (ref 15–150)

## 2022-02-22 LAB — C-REACTIVE PROTEIN: CRP: <1 mg/L (ref 0–10)

## 2022-02-22 LAB — TSH: TSH: 1.95 u[IU]/mL (ref 0.450–4.500)

## 2022-02-22 LAB — VITAMIN D 25 HYDROXY (VIT D DEFICIENCY, FRACTURES): Vit D, 25-Hydroxy: 32.9 ng/mL (ref 30.0–100.0)

## 2022-02-22 LAB — VITAMIN B12: Vitamin B-12: 368 pg/mL (ref 232–1245)

## 2022-02-22 LAB — ALPHA-1-ANTITRYPSIN: A-1 Antitrypsin: 148 mg/dL (ref 101–187)

## 2022-03-03 ENCOUNTER — Other Ambulatory Visit: Payer: Self-pay | Admitting: Nurse Practitioner

## 2022-03-03 ENCOUNTER — Telehealth: Payer: Self-pay

## 2022-03-03 DIAGNOSIS — F419 Anxiety disorder, unspecified: Secondary | ICD-10-CM

## 2022-03-03 NOTE — Telephone Encounter (Signed)
Requested medication (s) are due for refill today: yes  Requested medication (s) are on the active medication list: yes  Last refill:  11/07/21 #120/0  Future visit scheduled: yes  Notes to clinic:  Unable to refill per protocol, cannot delegate.      Requested Prescriptions  Pending Prescriptions Disp Refills   clonazePAM (KLONOPIN) 0.5 MG tablet 120 tablet 0    Sig: Take 2 tablets (1 mg total) by mouth 2 (two) times daily as needed for anxiety.     Not Delegated - Psychiatry: Anxiolytics/Hypnotics 2 Failed - 03/03/2022  2:17 PM      Failed - This refill cannot be delegated      Passed - Urine Drug Screen completed in last 360 days      Passed - Patient is not pregnant      Passed - Valid encounter within last 6 months    Recent Outpatient Visits           3 weeks ago Depression, recurrent ([place])   [place], Jolene T, NP   3 months ago Centrilobular emphysema ([place])   [place], Jolene T, NP   5 months ago Centrilobular emphysema ([place])   [place], Jolene T, NP   5 months ago Depression, recurrent ([place])   [place], Barbaraann Faster, NP   7 months ago [place], Bromley T, NP       Future Appointments             In 2 weeks [name], Barbaraann Faster, NP [facility], [facility]

## 2022-03-03 NOTE — Telephone Encounter (Signed)
Requested medication (s) are due for refill today - yes  Requested medication (s) are on the active medication list -yes  Future visit scheduled -yes  Last refill: 11/07/21 #120 at 0.'5mg'$  2 tablets/twice day  Notes to clinic: non delegated Rx  Requested Prescriptions  Pending Prescriptions Disp Refills   clonazePAM (KLONOPIN) 1 MG tablet [Pharmacy Med Name: clonazePAM 1 MG Oral Tablet] 60 tablet 0    Sig: Take 1 tablet by mouth twice daily     Not Delegated - Psychiatry: Anxiolytics/Hypnotics 2 Failed - 03/03/2022 12:22 PM      Failed - This refill cannot be delegated      Passed - Urine Drug Screen completed in last 360 days      Passed - Patient is not pregnant      Passed - Valid encounter within last 6 months    Recent Outpatient Visits           3 weeks ago Depression, recurrent ([place])   [place], Jolene T, NP   3 months ago Centrilobular emphysema ([place])   [place], Jolene T, NP   5 months ago Centrilobular emphysema ([place])   [place], Jolene T, NP   5 months ago Depression, recurrent ([place])   [place], Barbaraann Faster, NP   7 months ago [place], [place] T, NP       Future Appointments             In 2 weeks [name], Barbaraann Faster, NP [facility], [facility]               Requested Prescriptions  Pending Prescriptions Disp Refills   clonazePAM (KLONOPIN) 1 MG tablet [Pharmacy Med Name: clonazePAM 1 MG Oral Tablet] 60 tablet 0    Sig: Take 1 tablet by mouth twice daily     Not Delegated - Psychiatry: Anxiolytics/Hypnotics 2 Failed - 03/03/2022 12:22 PM      Failed - This refill cannot be delegated      Passed - Urine Drug Screen completed in last 360 days      Passed - Patient is not pregnant      Passed - Valid encounter within last 6 months    Recent Outpatient Visits           3 weeks ago Depression, recurrent ([place])   [place] [name], Jolene T, NP   3 months ago Centrilobular emphysema ([place])   [place], Jolene T, NP   5 months ago Centrilobular emphysema ([place])   [place], Jolene T, NP   5 months ago Depression, recurrent ([place])   [place], Barbaraann Faster, NP   7 months ago [place], [place] T, NP       Future Appointments             In 2 weeks [name], Barbaraann Faster, NP [facility], [facility]

## 2022-03-03 NOTE — Telephone Encounter (Signed)
Pt given lab results per notes of Jolene, NP on 03/03/22. Pt verbalized understanding and rheumatology appt on 04/04/22 with [facility]. Pt had concerns and had googled information for ANA profile and was worried about possible leukemia. I advised pt based on other labs I didn't feel like that was any concern and when she goes to Rheumatology appt they will explain more and do more testing to help determine what is causing joint pain.

## 2022-03-03 NOTE — Telephone Encounter (Signed)
Medication Refill - Medication: clonazePAM (KLONOPIN) 0.5 MG tablet  Has the patient contacted their pharmacy? Yes.     Preferred Pharmacy (with phone number or street name):  [facility] [place] - [address] Phone:  [phone]  Fax:  [phone]     Has the patient been seen for an appointment in the last year OR does the patient have an upcoming appointment? Yes.      Patient will be out of her medicine on Sunday 7/16 and needs it as soon as she can get it

## 2022-03-07 ENCOUNTER — Encounter: Payer: Self-pay | Admitting: Nurse Practitioner

## 2022-03-07 ENCOUNTER — Ambulatory Visit (INDEPENDENT_AMBULATORY_CARE_PROVIDER_SITE_OTHER): Payer: Medicare Other | Admitting: Nurse Practitioner

## 2022-03-07 DIAGNOSIS — F17210 Nicotine dependence, cigarettes, uncomplicated: Secondary | ICD-10-CM | POA: Diagnosis not present

## 2022-03-07 DIAGNOSIS — Z91018 Allergy to other foods: Secondary | ICD-10-CM | POA: Diagnosis not present

## 2022-03-07 DIAGNOSIS — R634 Abnormal weight loss: Secondary | ICD-10-CM | POA: Insufficient documentation

## 2022-03-07 DIAGNOSIS — R768 Other specified abnormal immunological findings in serum: Secondary | ICD-10-CM | POA: Diagnosis not present

## 2022-03-07 DIAGNOSIS — Z8052 Family history of malignant neoplasm of bladder: Secondary | ICD-10-CM | POA: Diagnosis not present

## 2022-03-07 DIAGNOSIS — M797 Fibromyalgia: Secondary | ICD-10-CM

## 2022-03-07 NOTE — Assessment & Plan Note (Signed)
Recent labs reassuring and recent office visit with 1 lbs loss, she reports more recently (unable to assess today due to telephone visit only).  She is very concerned for bladder or bowel cancer due to family history, will obtain CT abdomen and pelvis due to current symptoms to further assess.

## 2022-03-07 NOTE — Patient Instructions (Signed)

## 2022-03-07 NOTE — Assessment & Plan Note (Signed)
She would like referral to genetics for further assessment due to family history.  Will place this for reassurance, however have also reiterated the importance of smoking cessation for cancer prevention.

## 2022-03-07 NOTE — Progress Notes (Signed)
There were no vitals taken for this visit.   Subjective:    Patient ID: Lindsey Bautista, female    DOB: [birth year], 61 y.o.   MRN: [id number]  HPI: Lindsey Bautista is a 61 y.o. female  Chief Complaint  Patient presents with   Follow-up    Patient wants to discuss with provider about recent lab results and referrals that she recently sent for her.    This visit was completed via telephone due to the restrictions of the COVID-19 pandemic. All issues as above were discussed and addressed but no physical exam was performed. If it was felt that the patient should be evaluated in the office, they were directed there. The patient verbally consented to this visit. Patient was unable to complete an audio/visual visit due to Lack of equipment. Due to the catastrophic nature of the COVID-19 pandemic, this visit was done through audio contact only. Location of the patient: home Location of the provider: work Those involved with this call:  Provider: Marnee Guarneri, DNP CMA: Irena Reichmann, [place] Desk/Registration: [employer]  Time spent on call:  21 minutes on the phone discussing health concerns. 15 minutes total spent in review of patient's record and preparation of their chart.  I verified patient identity using two factors (patient name and date of birth). Patient consents verbally to being seen via telemedicine visit today.    ARTHRALGIAS / JOINT ACHES + CANCER CONCERNS Follow-up today to discuss recent labs.  Ongoing muscle aches reported from neck to legs, whole body aches -- present for several months -- ANA recently returned positive with homogeneous pattern 1:160.  Is scheduled to see rheumatology in August 2023.  She does have diagnosis of fibromyalgia on chart.  Was noted to have elevations in Alpha Gal allergy labs, she is to see allergist, further discussed with her today.   Wondered about blood work for cancer, she is very concerned about this -- discusses frequently at visits and  wishes there was full blood panel to look for all cancers.  She is very concerned for bladder cancer as reports these muscle aches are how her mother and aunts x 2 started out with bladder cancer -- reports cramping in legs/hands + no appetite + cramping in abdomen.  Does continue to smoke about 1/2 PPD.  She reports some weight loss presenting that is not expected due to loss of appetite. Duration: months Pain: yes Symmetric: yes -- right side is worse 9/10 Quality: dull, aching, and throbbing Frequency: constant Context:  worse Decreased function/range of motion: yes Erythema: none Swelling: yes -- right hand can not close it for a few hours after waking up Heat or warmth: none Morning stiffness: yes Aggravating factors: lifting heavy Alleviating factors: nothing Relief with NSAIDs?: No NSAIDs Taken Treatments attempted:  rest and heat  Involved Joints:     Hands: yes bilateral    Wrists: yes bilateral     Elbows: yes bilateral    Shoulders: yes bilateral    Back: yes     Hips: yes bilateral    Knees: yes bilateral    Ankles: yes bilateral    Feet: yes bilateral    Relevant past medical, surgical, family and social history reviewed and updated as indicated. Interim medical history since our last visit reviewed. Allergies and medications reviewed and updated.  Review of Systems  Constitutional:  Positive for appetite change and fatigue. Negative for activity change, diaphoresis and fever.  Respiratory:  Negative for cough, chest  tightness, shortness of breath and wheezing.   Cardiovascular:  Negative for chest pain, palpitations and leg swelling.  Gastrointestinal:  Positive for abdominal distention and abdominal pain. Negative for constipation, diarrhea, nausea and vomiting.  Endocrine: Negative for cold intolerance and heat intolerance.  Musculoskeletal:  Positive for arthralgias.  Neurological: Negative.   Psychiatric/Behavioral:  Negative for decreased concentration,  self-injury, sleep disturbance and suicidal ideas. The patient is nervous/anxious.     Per HPI unless specifically indicated above     Objective:    There were no vitals taken for this visit.  Wt Readings from Last 3 Encounters:  02/10/22 146 lb 6.4 oz (66.4 kg)  11/14/21 147 lb 12.8 oz (67 kg)  10/17/21 147 lb (66.7 kg)    Physical Exam  Unable to perform due to telephone visit only.  Results for orders placed or performed in visit on 02/10/22  WET PREP FOR [place], YEAST, CLUE   Specimen: Urine   Urine  Result Value Ref Range   Trichomonas Exam Negative Negative   Yeast Exam Negative Negative   Clue Cell Exam Negative Negative  CBC with Differential/Platelet  Result Value Ref Range   WBC 6.1 3.4 - 10.8 x10E3/uL   RBC 4.45 3.77 - 5.28 x10E6/uL   Hemoglobin 13.2 11.1 - 15.9 g/dL   Hematocrit 40.3 34.0 - 46.6 %   MCV 91 79 - 97 fL   MCH 29.7 26.6 - 33.0 pg   MCHC 32.8 31.5 - 35.7 g/dL   RDW 12.8 11.7 - 15.4 %   Platelets 264 150 - 450 x10E3/uL   Neutrophils 54 Not Estab. %   Lymphs 35 Not Estab. %   Monocytes 8 Not Estab. %   Eos 2 Not Estab. %   Basos 1 Not Estab. %   Neutrophils Absolute 3.3 1.4 - 7.0 x10E3/uL   Lymphocytes Absolute 2.1 0.7 - 3.1 x10E3/uL   Monocytes Absolute 0.5 0.1 - 0.9 x10E3/uL   EOS (ABSOLUTE) 0.1 0.0 - 0.4 x10E3/uL   Basophils Absolute 0.1 0.0 - 0.2 x10E3/uL   Immature Granulocytes 0 Not Estab. %   Immature Grans (Abs) 0.0 0.0 - 0.1 x10E3/uL  Comprehensive metabolic panel  Result Value Ref Range   Glucose 75 70 - 99 mg/dL   BUN 7 (L) 8 - 27 mg/dL   Creatinine, Ser 0.97 0.57 - 1.00 mg/dL   eGFR 67 >59 mL/min/1.73   BUN/Creatinine Ratio 7 (L) 12 - 28   Sodium 140 134 - 144 mmol/L   Potassium 4.2 3.5 - 5.2 mmol/L   Chloride 102 96 - 106 mmol/L   CO2 22 20 - 29 mmol/L   Calcium 9.4 8.7 - 10.3 mg/dL   Total Protein 6.6 6.0 - 8.5 g/dL   Albumin 4.3 3.8 - 4.9 g/dL   Globulin, Total 2.3 1.5 - 4.5 g/dL   Albumin/Globulin Ratio 1.9 1.2 - 2.2    Bilirubin Total 0.3 0.0 - 1.2 mg/dL   Alkaline Phosphatase 88 44 - 121 IU/L   AST 22 0 - 40 IU/L   ALT 15 0 - 32 IU/L  Lipid Panel w/o Chol/HDL Ratio  Result Value Ref Range   Cholesterol, Total 150 100 - 199 mg/dL   Triglycerides 59 0 - 149 mg/dL   HDL 58 >39 mg/dL   VLDL Cholesterol Cal 12 5 - 40 mg/dL   LDL Chol Calc (NIH) 80 0 - 99 mg/dL  TSH  Result Value Ref Range   TSH 1.950 0.450 - 4.500 uIU/mL  Vitamin  B12  Result Value Ref Range   Vitamin B-12 368 232 - 1,245 pg/mL  VITAMIN D 25 Hydroxy (Vit-D Deficiency, Fractures)  Result Value Ref Range   Vit D, 25-Hydroxy 32.9 30.0 - 100.0 ng/mL  Urinalysis, Routine w reflex microscopic  Result Value Ref Range   Specific Gravity, UA 1.020 1.005 - 1.030   pH, UA 6.5 5.0 - 7.5   Color, UA Yellow Yellow   Appearance Ur Clear Clear   Leukocytes,UA Negative Negative   Protein,UA Negative Negative/Trace   Glucose, UA Negative Negative   Ketones, UA Negative Negative   RBC, UA Negative Negative   Bilirubin, UA Negative Negative   Urobilinogen, Ur 0.2 0.2 - 1.0 mg/dL   Nitrite, UA Negative Negative  Sed Rate (ESR)  Result Value Ref Range   Sed Rate 8 0 - 40 mm/hr  Alpha-Gal Panel  Result Value Ref Range   Class Description Allergens Comment    IgE (Immunoglobulin E), Serum 126 6 - 495 IU/mL   O215-IgE Alpha-Gal 4.75 (A) Class IV kU/L   Beef IgE 1.41 (A) Class III kU/L   Pork IgE 1.06 (A) Class II kU/L   Allergen Lamb IgE 1.04 (A) Class II kU/L  Iron and TIBC  Result Value Ref Range   Total Iron Binding Capacity 388 250 - 450 ug/dL   UIBC 342 131 - 425 ug/dL   Iron 46 27 - 159 ug/dL   Iron Saturation 12 (L) 15 - 55 %  ANA 12 Plus Profile (RDL)  Result Value Ref Range   Anti-Nuclear Ab by IFA (RDL) Positive (A) Negative  Alpha-1-antitrypsin  Result Value Ref Range   A-1 Antitrypsin 148 101 - 187 mg/dL  Ferritin  Result Value Ref Range   Ferritin 15 15 - 150 ng/mL  C-reactive protein  Result Value Ref Range   CRP <1  0 - 10 mg/L  ANA 12 Plus Profile, Positive  Result Value Ref Range   Homogeneous Pattern 1:160 (H) <1:40   Note: Comment    Anti-Centromere Ab (RDL) <1:40 <1:40   Anti-dsDNA Ab by Farr(RDL) <8.0 <8.0 IU/mL   Anti-Sm Ab (RDL) <20 <20 Units   Anti-U1 RNP Ab (RDL) <20 <20 Units   Anti-Ro (SS-A) Ab (RDL) <20 <20 Units   Anti-La (SS-B) Ab (RDL) <20 <20 Units   Anti-Scl-70 Ab (RDL) <20 <20 Units   Anti-Cardiolipin Ab, IgG (RDL) <15 <15 GPL U/mL   Anti-Cardiolipin Ab, IgA (RDL) <12 <12 APL U/mL   Anti-Cardiolipin Ab, IgM (RDL) <13 <13 MPL U/mL   C3 Complement (RDL) 154 82 - 167 mg/dL   C4 Complement (RDL) 27 14 - 44 mg/dL   Anti-TPO Ab (RDL) <9.0 <9.0 IU/mL   Anti-Chromatin Ab, IgG (RDL) <20 <20 Units   Anti-CCP Ab, IgG & IgA (RDL) <20 <20 Units   Rheumatoid Factor by Turb RDL <14 <14 IU/mL   ANA Plus 12 Interpretation Comment       Assessment & Plan:   Problem List Items Addressed This Visit       Other   Allergy to alpha-gal    Noted on recent labs with elevation, referral to allergist was placed and she is to schedule with them.  Educated her further on this today.      ANA positive    Noted recent labs, she is scheduled to see rheumatology in August for this.  Appreciate their input.      Family history of bladder cancer    She would like  referral to genetics for further assessment due to family history.  Will place this for reassurance, however have also reiterated the importance of smoking cessation for cancer prevention.      Relevant Orders   Ambulatory referral to Genetics   Fibromyalgia    Chronic, ongoing with worsening currently present.  Recommended she try stopping Prozac and change to Duloxetine which would benefit mood and pain, she refuses this - fearful of change.  Recommend she continue Gabapentin.      Nicotine dependence, cigarettes, uncomplicated    I have recommended complete cessation of tobacco use. I have discussed various options available for  assistance with tobacco cessation including over the counter methods (Nicotine gum, patch and lozenges). We also discussed prescription options (Chantix, Nicotine Inhaler / Nasal Spray). The patient is not interested in pursuing any prescription tobacco cessation options at this time.       Unintentional weight loss - Primary    Recent labs reassuring and recent office visit with 1 lbs loss, she reports more recently (unable to assess today due to telephone visit only).  She is very concerned for bladder or bowel cancer due to family history, will obtain CT abdomen and pelvis due to current symptoms to further assess.      Relevant Orders   CT Abdomen Pelvis Wo Contrast    I discussed the assessment and treatment plan with the patient. The patient was provided an opportunity to ask questions and all were answered. The patient agreed with the plan and demonstrated an understanding of the instructions.   The patient was advised to call back or seek an in-person evaluation if the symptoms worsen or if the condition fails to improve as anticipated.   I provided 21+ minutes of time during this encounter.   Follow up plan: Return if symptoms worsen or fail to improve.

## 2022-03-07 NOTE — Assessment & Plan Note (Signed)
Noted recent labs, she is scheduled to see rheumatology in August for this.  Appreciate their input.

## 2022-03-07 NOTE — Assessment & Plan Note (Signed)
Noted on recent labs with elevation, referral to allergist was placed and she is to schedule with them.  Educated her further on this today.

## 2022-03-07 NOTE — Assessment & Plan Note (Signed)
Chronic, ongoing with worsening currently present.  Recommended she try stopping Prozac and change to Duloxetine which would benefit mood and pain, she refuses this - fearful of change.  Recommend she continue Gabapentin.

## 2022-03-07 NOTE — Assessment & Plan Note (Signed)
I have recommended complete cessation of tobacco use. I have discussed various options available for assistance with tobacco cessation including over the counter methods (Nicotine gum, patch and lozenges). We also discussed prescription options (Chantix, Nicotine Inhaler / Nasal Spray). The patient is not interested in pursuing any prescription tobacco cessation options at this time.  

## 2022-03-14 ENCOUNTER — Ambulatory Visit
Admission: RE | Admit: 2022-03-14 | Discharge: 2022-03-14 | Disposition: A | Discharge status: Home / Self Care | Payer: Medicare Other | Source: Ambulatory Visit | Attending: Nurse Practitioner | Admitting: Nurse Practitioner

## 2022-03-14 DIAGNOSIS — R634 Abnormal weight loss: Secondary | ICD-10-CM | POA: Insufficient documentation

## 2022-03-14 DIAGNOSIS — I700 Atherosclerosis of aorta: Secondary | ICD-10-CM | POA: Diagnosis not present

## 2022-03-14 DIAGNOSIS — Z90710 Acquired absence of both cervix and uterus: Secondary | ICD-10-CM | POA: Diagnosis not present

## 2022-03-15 NOTE — Progress Notes (Signed)
Good morning, please let Lindsey Bautista know her imaging has returned and is overall stable.  Bladder is normal with no concerning findings. Liver, pancreas, and spleen are all stable.  There is a small adrenal gland nodule which they state likely represents a benign finding and no further imaging needed, the adrenal glands are located above each kidney and nodules can be a common finding.  If you would like further assessment of this we can always send you to endocrinology for further assessment and labs to evaluate.  Is this something you would like to pursue? Keep being stellar!!  Thank you for allowing me to participate in your care.  I appreciate you. Kindest regards, Reneka Nebergall

## 2022-03-16 ENCOUNTER — Telehealth: Payer: Self-pay | Admitting: *Deleted

## 2022-03-16 NOTE — Telephone Encounter (Signed)
Patient called with further questions about location of nodule and function of adrenal gland. Questions answered and patient will call back if she decides to go forward with referral

## 2022-03-17 ENCOUNTER — Ambulatory Visit: Payer: Medicare Other | Admitting: Nurse Practitioner

## 2022-04-04 ENCOUNTER — Inpatient Hospital Stay: Payer: Medicare Other

## 2022-04-04 ENCOUNTER — Encounter: Payer: Self-pay | Admitting: Licensed Clinical Social Worker

## 2022-04-04 ENCOUNTER — Inpatient Hospital Stay: Payer: Medicare Other | Attending: Oncology | Admitting: Licensed Clinical Social Worker

## 2022-04-04 DIAGNOSIS — Z8052 Family history of malignant neoplasm of bladder: Secondary | ICD-10-CM

## 2022-04-04 DIAGNOSIS — Z86010 Personal history of colonic polyps: Secondary | ICD-10-CM | POA: Diagnosis not present

## 2022-04-04 DIAGNOSIS — R899 Unspecified abnormal finding in specimens from other organs, systems and tissues: Secondary | ICD-10-CM | POA: Diagnosis not present

## 2022-04-04 DIAGNOSIS — Z803 Family history of malignant neoplasm of breast: Secondary | ICD-10-CM

## 2022-04-04 DIAGNOSIS — Z8042 Family history of malignant neoplasm of prostate: Secondary | ICD-10-CM

## 2022-04-04 DIAGNOSIS — R768 Other specified abnormal immunological findings in serum: Secondary | ICD-10-CM | POA: Diagnosis not present

## 2022-04-04 DIAGNOSIS — Z800 Family history of malignant neoplasm of digestive organs: Secondary | ICD-10-CM

## 2022-04-04 NOTE — Progress Notes (Signed)
REFERRING PROVIDER: Venita Lick, NP [address] [place],  [place] [address]  PRIMARY PROVIDER:  Venita Lick, NP  PRIMARY REASON FOR VISIT:  1. Family history of bladder cancer   2. Family history of breast cancer   3. Family history of colon cancer   4. Family history of prostate cancer      HISTORY OF PRESENT ILLNESS:   [name], a 61 y.o. female, was seen for a [facility] cancer genetics consultation at the request of Dr. Ned Card due to a family history of cancer.  Ms. Wynn presents to clinic today to discuss the possibility of a hereditary predisposition to cancer, genetic testing, and to further clarify her future cancer risks, as well as potential cancer risks for family members.   [name] is a 61 y.o. female with no personal history of cancer.    RISK FACTORS:  Menarche was at age 37.  First live birth at age 73.  OCP use for approximately more than 5 years. Ovaries intact: no.  Hysterectomy: yes.  Menopausal status: postmenopausal.  HRT use:  30+  years. Colonoscopy: yes;  has had 2 colonoscopies, reports a few polyps . Mammogram within the last year: yes. Number of breast biopsies: 0.   Past Medical History:  Diagnosis Date   Atypical chest pain    a. 08/2016 Neg MV.   COPD (chronic obstructive pulmonary disease) (HCC)    Coronary artery disease, non-occlusive    a.  LHC 06/19/2018: Mid LAD 50% with iFR 0.94   Depression    Diastolic dysfunction    a. 10/2016 Echo: EF 60-65%, no rwma, Gr1 DD, mild MR, nl LA size, nl RV fxn; b. 05/2018 Echo: EF 60-65%. No rwma. Mild MR. Nl RV fxn.   GERD (gastroesophageal reflux disease)    Hypotension    IBS (irritable bowel syndrome)    Right arm weakness    Situational syncope    Stroke [facility])    TIA (transient ischemic attack)     Past Surgical History:  Procedure Laterality Date   COLONOSCOPY WITH PROPOFOL N/A 11/22/2015   Procedure: COLONOSCOPY WITH PROPOFOL;  Surgeon: Manya Silvas, MD;  Location: [facility]  ENDOSCOPY;  Service: Endoscopy;  Laterality: N/A;   ESOPHAGOGASTRODUODENOSCOPY (EGD) WITH PROPOFOL N/A 11/22/2015   Procedure: ESOPHAGOGASTRODUODENOSCOPY (EGD) WITH PROPOFOL;  Surgeon: Manya Silvas, MD;  Location: [facility] ENDOSCOPY;  Service: Endoscopy;  Laterality: N/A;   INTRAVASCULAR PRESSURE WIRE/FFR STUDY N/A 06/19/2018   Procedure: INTRAVASCULAR PRESSURE WIRE/FFR STUDY;  Surgeon: Nelva Bush, MD;  Location: [facility] CV LAB;  Service: Cardiovascular;  Laterality: N/A;   LEFT HEART CATH AND CORONARY ANGIOGRAPHY N/A 06/19/2018   Procedure: LEFT HEART CATH AND CORONARY ANGIOGRAPHY;  Surgeon: Nelva Bush, MD;  Location: [facility] LAB;  Service: Cardiovascular;  Laterality: N/A;   OOPHORECTOMY     TONSILLECTOMY     VAGINAL HYSTERECTOMY      FAMILY HISTORY:  We obtained a detailed, 4-generation family history.  Significant diagnoses are listed below: Family History  Problem Relation Age of Onset   Bladder Cancer Mother 62   Heart failure Mother    Renal Disease Mother    COPD Father    Bladder Cancer Maternal Aunt    Bladder Cancer Maternal Aunt    Breast cancer Maternal Aunt    Breast cancer Paternal Aunt 5   Colon cancer Maternal Grandmother    Cancer Paternal Grandfather        unk primary, metastatic, d  37s   Heart disease Other    COPD Other    Stroke Other    Prostate cancer Cousin        metastatic   Prostate cancer Cousin    [name] has 2 daughters, 60 and 107. She does not have siblings aside from a half sister that she does not have contact with.   [name]'s mother passed at 40 of bladder cancer. Patient has 6 maternal aunts/uncles. Two aunts also had bladder cancer, and another aunt had breast cancer. A maternal cousin had metastatic prostate cancer and passed from it. Maternal grandmother had colon cancer.   [name]'s father died at 70 with no cancer history. Patient had 1 paternal aunt who had breast cancer at 56. A paternal cousin had  prostate cancer. Paternal grandfather passed of metastatic cancer, unknown primary, in his 65s.  [name] is unaware of previous family history of genetic testing for hereditary cancer risks. There is no reported Ashkenazi Jewish ancestry. There is no known consanguinity.    GENETIC COUNSELING ASSESSMENT: [name] is a 61 y.o. female with a family history of cancer which is somewhat suggestive of a hereditary cancer syndrome and predisposition to cancer. We, therefore, discussed and recommended the following at today's visit.   DISCUSSION: We discussed that approximately 10% of cancer is hereditary. Most cases of hereditary bladder cancer are associated with Lynch syndrome genes, which also increase the risk for colon cancer and sometimes prostate cancer. There are other genes associated with hereditary  cancer as well. Cancers and risks are gene specific. We discussed that testing is beneficial for several reasons including knowing about cancer risks, identifying potential screening and risk-reduction options that may be appropriate, and to understand if other family members could be at risk for cancer and allow them to undergo genetic testing.   We reviewed the characteristics, features and inheritance patterns of hereditary cancer syndromes. We also discussed genetic testing, including the appropriate family members to test, the process of testing, insurance coverage and turn-around-time for results. We discussed the implications of a negative, positive and/or variant of uncertain significant result. We recommended [name] pursue genetic testing for the [facility] Common Hereditary Cancers+RNA gene panel.   Based on [name]'s family history of cancer, she meets medical criteria for genetic testing. Despite that she meets criteria, she may still have an out of pocket cost. We discussed that if her out of pocket cost for testing is over $100, the laboratory will call and confirm whether she wants  to proceed with testing.  If the out of pocket cost of testing is less than $100 she will be billed by the genetic testing laboratory.   PLAN: After considering the risks, benefits, and limitations, Ms. Snowdon provided informed consent to pursue genetic testing and the blood sample was sent to [facility] for analysis of the Common Hereditary Cancers + RNA panel. Results should be available within approximately 2-3 weeks' time, at which point they will be disclosed by telephone to Ms. Calamia, as will any additional recommendations warranted by these results. Ms. Lieder will receive a summary of her genetic counseling visit and a copy of her results once available. This information will also be available in Epic.   Ms. Pinkerton's questions were answered to her satisfaction today. Our contact information was provided should additional questions or concerns arise. Thank you for the referral and allowing [place] to share in the care of your patient.   Faith Rogue, MS, [place]  Journalist, [email or web address] Phone: [phone]  The patient was seen for a total of 30 minutes in face-to-face genetic counseling.  Patient's husband Fritz Pickerel was also present. Dr. Grayland Ormond was available for discussion regarding this case.   _______________________________________________________________________ For Office Staff:  Number of people involved in session: 2 Was an Intern/ student involved with case: no

## 2022-04-11 DIAGNOSIS — H2511 Age-related nuclear cataract, right eye: Secondary | ICD-10-CM | POA: Diagnosis not present

## 2022-04-25 ENCOUNTER — Ambulatory Visit: Payer: Self-pay | Admitting: Licensed Clinical Social Worker

## 2022-04-25 ENCOUNTER — Telehealth: Payer: Self-pay | Admitting: Licensed Clinical Social Worker

## 2022-04-25 ENCOUNTER — Encounter: Payer: Self-pay | Admitting: Licensed Clinical Social Worker

## 2022-04-25 DIAGNOSIS — Z1379 Encounter for other screening for genetic and chromosomal anomalies: Secondary | ICD-10-CM

## 2022-04-25 NOTE — Telephone Encounter (Signed)
I contacted Ms. Hoctor to discuss her genetic testing results. No pathogenic variants were identified in the 47 genes analyzed. Detailed clinic note to follow.   The test report has been scanned into EPIC and is located under the Molecular Pathology section of the Results Review tab.  A portion of the result report is included below for reference.      Faith Rogue, MS, [facility] Genetic Counselor [email or web address] Phone: [phone]

## 2022-04-25 NOTE — Progress Notes (Signed)
HPI:   Lindsey Bautista was previously seen in the [facility] due to a family history of cancer and concerns regarding a hereditary predisposition to cancer. Please refer to our prior cancer genetics clinic note for more information regarding our discussion, assessment and recommendations, at the time. Lindsey Bautista's recent genetic test results were disclosed to Lindsey Bautista, as were recommendations warranted by these results. These results and recommendations are discussed in more detail below.  CANCER HISTORY:  Oncology History   No history exists.    FAMILY HISTORY:  We obtained a detailed, 4-generation family history.  Significant diagnoses are listed below: Family History  Problem Relation Age of Onset   Bladder Cancer Mother 54   Heart failure Mother    Renal Disease Mother    COPD Father    Bladder Cancer Maternal Aunt    Bladder Cancer Maternal Aunt    Breast cancer Maternal Aunt    Breast cancer Paternal Aunt 49   Colon cancer Maternal Grandmother    Cancer Paternal Grandfather        unk primary, metastatic, d 82s   Heart disease Other    COPD Other    Stroke Other    Prostate cancer Cousin        metastatic   Prostate cancer Cousin     Lindsey Bautista has 2 daughters, 31 and 12. She does not have siblings aside from a half sister that she does not have contact with.    Lindsey Bautista's mother passed at 53 of bladder cancer. Patient has 6 maternal aunts/uncles. Two aunts also had bladder cancer, and another aunt had breast cancer. A maternal cousin had metastatic prostate cancer and passed from it. Maternal grandmother had colon cancer.    Lindsey Bautista's father died at 70 with no cancer history. Patient had 1 paternal aunt who had breast cancer at 1. A paternal cousin had prostate cancer. Paternal grandfather passed of metastatic cancer, unknown primary, in his 44s.   Lindsey Bautista is unaware of previous family history of genetic testing for hereditary cancer risks. There is  no reported Ashkenazi Jewish ancestry. There is no known consanguinity.      GENETIC TEST RESULTS:  The [facility] Common Hereditary Cancers+RNA Panel found no pathogenic mutations.   The Common Hereditary Cancers Panel + RNA offered by [facility] includes sequencing and/or deletion duplication testing of the following 47 genes: APC, ATM, AXIN2, BARD1, BMPR1A, BRCA1, BRCA2, BRIP1, CDH1, CDKN2A (p14ARF), CDKN2A (p16INK4a), CKD4, CHEK2, CTNNA1, DICER1, EPCAM (Deletion/duplication testing only), GREM1 (promoter region deletion/duplication testing only), KIT, MEN1, MLH1, MSH2, MSH3, MSH6, MUTYH, NBN, NF1, NHTL1, PALB2, PDGFRA, PMS2, POLD1, POLE, PTEN, RAD50, RAD51C, RAD51D, SDHB, SDHC, SDHD, SMAD4, SMARCA4. STK11, TP53, TSC1, TSC2, and VHL.  The following genes were evaluated for sequence changes only: SDHA and HOXB13 c.251G>A variant only.   The test report has been scanned into EPIC and is located under the Molecular Pathology section of the Results Review tab.  A portion of the result report is included below for reference. Genetic testing reported out on 04/24/2022.      Even though a pathogenic variant was not identified, possible explanations for the cancer in the family may include: There may be no hereditary risk for cancer in the family. The cancers in Lindsey Bautista family may be sporadic/familial or due to other genetic and environmental factors. There may be a gene mutation in one of these genes that current testing methods cannot detect but that chance is small. There could  be another gene that has not yet been discovered, or that we have not yet tested, that is responsible for the cancer diagnoses in the family.  It is also possible there is a hereditary cause for the cancer in the family that Lindsey Bautista did not inherit.   Therefore, it is important to remain in touch with cancer genetics in the future so that we can continue to offer Lindsey Bautista the most up to date genetic testing.   ADDITIONAL GENETIC  TESTING:  We discussed with Lindsey Bautista that Lindsey Bautista genetic testing was fairly extensive.  If there are additional relevant genes identified to increase cancer risk that can be analyzed in the future, we would be happy to discuss and coordinate this testing at that time.     CANCER SCREENING RECOMMENDATIONS:  Lindsey Bautista's test result is considered negative (normal).  This means that we have not identified a hereditary cause for Lindsey Bautista family history of cancer at this time.   An individual's cancer risk and medical management are not determined by genetic test results alone. Overall cancer risk assessment incorporates additional factors, including personal medical history, family history, and any available genetic information that may result in a personalized plan for cancer prevention and surveillance. Therefore, it is recommended she continue to follow the cancer management and screening guidelines provided by Lindsey Bautista primary healthcare provider.  The Tyrer-Cuzick model is one of multiple prediction models developed to estimate an individual's lifetime risk of developing breast cancer. The Tyrer-Cuzick model is endorsed by the [employer]  (NCCN). This model includes many risk factors such as family history, endogenous estrogen exposure, and benign breast disease. The calculation is highly-dependent on the accuracy of clinical data provided by the patient and can change over time. The Tyrer-Cuzick model may be repeated to reflect new information in Lindsey Bautista personal or family history in the future.    Lindsey Bautista's Tyrer-Cuzick risk score is 10%.  Based on this model, Lindsey Bautista is not at high risk for breast cancer.    RECOMMENDATIONS FOR FAMILY MEMBERS:   Since she did not inherit a identifiable mutation in a cancer predisposition gene included on this panel, Lindsey Bautista children could not have inherited a known mutation from Lindsey Bautista in one of these genes. Individuals in this family might be at some  increased risk of developing cancer, over the general population risk, due to the family history of cancer.  Individuals in the family should notify their providers of the family history of cancer. We recommend women in this family have a yearly mammogram beginning at age 60, or 39 years younger than the earliest onset of cancer, an annual clinical breast exam, and perform monthly breast self-exams.  Family members should have colonoscopies by at age 13, or earlier, as recommended by their providers. Other members of the family may still carry a pathogenic variant in one of these genes that Ms. Bake did not inherit. Based on the family history, we recommend maternal relatives, especially those who have had cancer, have genetic counseling and testing. Ms. Vanorman will let us know if we can be of any assistance in coordinating genetic counseling and/or testing for this family member.    FOLLOW-UP:  Lastly, we discussed with Ms. Borcherding that cancer genetics is a rapidly advancing field and it is possible that new genetic tests will be appropriate for Lindsey Bautista and/or Lindsey Bautista family members in the future. We encouraged Lindsey Bautista to remain in contact with cancer genetics on an annual basis so  we can update Lindsey Bautista personal and family histories and let Lindsey Bautista know of advances in cancer genetics that may benefit this family.   Our contact number was provided. Ms. [place]'s questions were answered to Lindsey Bautista satisfaction, and she knows she is welcome to call us at anytime with additional questions or concerns.    Lindsey Rogue, MS, [facility] Genetic Counselor [email or web address] Phone: [phone]

## 2022-05-01 ENCOUNTER — Encounter: Payer: Self-pay | Admitting: Ophthalmology

## 2022-05-02 NOTE — Discharge Instructions (Signed)

## 2022-05-04 ENCOUNTER — Other Ambulatory Visit: Payer: Self-pay

## 2022-05-04 ENCOUNTER — Encounter
Admission: RE | Disposition: A | Discharge status: Home / Self Care | Payer: Self-pay | Source: Home / Self Care | Attending: Ophthalmology

## 2022-05-04 ENCOUNTER — Ambulatory Visit
Admission: RE | Admit: 2022-05-04 | Discharge: 2022-05-04 | Disposition: A | Discharge status: Home / Self Care | Payer: Medicare Other | Attending: Ophthalmology | Admitting: Ophthalmology

## 2022-05-04 ENCOUNTER — Ambulatory Visit: Payer: Medicare Other | Admitting: General Practice

## 2022-05-04 ENCOUNTER — Encounter: Payer: Self-pay | Admitting: Ophthalmology

## 2022-05-04 ENCOUNTER — Ambulatory Visit (AMBULATORY_SURGERY_CENTER): Payer: Medicare Other | Admitting: General Practice

## 2022-05-04 DIAGNOSIS — I2510 Atherosclerotic heart disease of native coronary artery without angina pectoris: Secondary | ICD-10-CM | POA: Insufficient documentation

## 2022-05-04 DIAGNOSIS — H2511 Age-related nuclear cataract, right eye: Secondary | ICD-10-CM

## 2022-05-04 DIAGNOSIS — M797 Fibromyalgia: Secondary | ICD-10-CM | POA: Diagnosis not present

## 2022-05-04 DIAGNOSIS — K219 Gastro-esophageal reflux disease without esophagitis: Secondary | ICD-10-CM | POA: Diagnosis not present

## 2022-05-04 DIAGNOSIS — F17210 Nicotine dependence, cigarettes, uncomplicated: Secondary | ICD-10-CM

## 2022-05-04 DIAGNOSIS — F418 Other specified anxiety disorders: Secondary | ICD-10-CM

## 2022-05-04 DIAGNOSIS — Z8673 Personal history of transient ischemic attack (TIA), and cerebral infarction without residual deficits: Secondary | ICD-10-CM | POA: Diagnosis not present

## 2022-05-04 DIAGNOSIS — F17200 Nicotine dependence, unspecified, uncomplicated: Secondary | ICD-10-CM | POA: Insufficient documentation

## 2022-05-04 DIAGNOSIS — J449 Chronic obstructive pulmonary disease, unspecified: Secondary | ICD-10-CM | POA: Insufficient documentation

## 2022-05-04 DIAGNOSIS — Z79899 Other long term (current) drug therapy: Secondary | ICD-10-CM | POA: Diagnosis not present

## 2022-05-04 HISTORY — DX: Unspecified osteoarthritis, unspecified site: M19.90

## 2022-05-04 HISTORY — PX: CATARACT EXTRACTION W/PHACO: SHX586

## 2022-05-04 HISTORY — DX: Presence of dental prosthetic device (complete) (partial): Z97.2

## 2022-05-04 HISTORY — DX: Fibromyalgia: M79.7

## 2022-05-04 SURGERY — PHACOEMULSIFICATION, CATARACT, WITH IOL INSERTION
Anesthesia: Monitor Anesthesia Care | Site: Eye | Laterality: Right

## 2022-05-04 MED ORDER — LACTATED RINGERS IV SOLN: INTRAVENOUS | Status: DC

## 2022-05-04 MED ORDER — BRIMONIDINE TARTRATE-TIMOLOL 0.2-0.5 % OP SOLN
OPHTHALMIC | Status: DC | PRN
  Administered 2022-05-04: 1 [drp] via OPHTHALMIC

## 2022-05-04 MED ORDER — MIDAZOLAM HCL 2 MG/2ML IJ SOLN
INTRAMUSCULAR | Status: DC | PRN
  Administered 2022-05-04: 2 mg via INTRAVENOUS

## 2022-05-04 MED ORDER — ARMC OPHTHALMIC DILATING DROPS
1.0000 | OPHTHALMIC | Status: DC | PRN
  Administered 2022-05-04 (×3): 1 via OPHTHALMIC

## 2022-05-04 MED ORDER — SIGHTPATH DOSE#1 BSS IO SOLN
INTRAOCULAR | Status: DC | PRN
  Administered 2022-05-04: 15 mL

## 2022-05-04 MED ORDER — MOXIFLOXACIN HCL 0.5 % OP SOLN
OPHTHALMIC | Status: DC | PRN
  Administered 2022-05-04: 0.2 mL via OPHTHALMIC

## 2022-05-04 MED ORDER — FENTANYL CITRATE (PF) 100 MCG/2ML IJ SOLN
INTRAMUSCULAR | Status: DC | PRN
  Administered 2022-05-04 (×2): 50 ug via INTRAVENOUS

## 2022-05-04 MED ORDER — SIGHTPATH DOSE#1 BSS IO SOLN
INTRAOCULAR | Status: DC | PRN
  Administered 2022-05-04: 52 mL via OPHTHALMIC

## 2022-05-04 MED ORDER — SIGHTPATH DOSE#1 NA HYALUR & NA CHOND-NA HYALUR IO KIT
PACK | INTRAOCULAR | Status: DC | PRN
  Administered 2022-05-04: 1 via OPHTHALMIC

## 2022-05-04 MED ORDER — TETRACAINE HCL 0.5 % OP SOLN
1.0000 [drp] | OPHTHALMIC | Status: DC | PRN
  Administered 2022-05-04 (×3): 1 [drp] via OPHTHALMIC

## 2022-05-04 MED ORDER — LIDOCAINE HCL (PF) 2 % IJ SOLN
INTRAOCULAR | Status: DC | PRN
  Administered 2022-05-04: 1 mL via INTRAOCULAR

## 2022-05-04 SURGICAL SUPPLY — 12 items
CATARACT SUITE SIGHTPATH (MISCELLANEOUS) ×1 IMPLANT
DISSECTOR HYDRO NUCLEUS 50X22 (MISCELLANEOUS) ×1 IMPLANT
DRSG TEGADERM 2-3/8X2-3/4 SM (GAUZE/BANDAGES/DRESSINGS) ×1 IMPLANT
FEE CATARACT SUITE SIGHTPATH (MISCELLANEOUS) ×1 IMPLANT
GLOVE SURG SYN 7.5  E (GLOVE) ×1
GLOVE SURG SYN 7.5 E (GLOVE) ×1 IMPLANT
GLOVE SURG SYN 7.5 PF PI (GLOVE) ×1 IMPLANT
GLOVE SURG SYN 8.5  E (GLOVE) ×1
GLOVE SURG SYN 8.5 E (GLOVE) ×1 IMPLANT
GLOVE SURG SYN 8.5 PF PI (GLOVE) ×1 IMPLANT
LENS IOL TECNIS EYHANCE 24.0 (Intraocular Lens) IMPLANT
WATER STERILE IRR 250ML POUR (IV SOLUTION) ×1 IMPLANT

## 2022-05-04 NOTE — Anesthesia Postprocedure Evaluation (Signed)
Anesthesia Post Note  Patient: Lindsey Bautista  Procedure(s) Performed: CATARACT EXTRACTION PHACO AND INTRAOCULAR LENS PLACEMENT (IOC) RIGHT 3.89 00:47.4 (Right: Eye)     Patient location during evaluation: PACU Anesthesia Type: MAC Level of consciousness: awake and alert Pain management: pain level controlled Vital Signs Assessment: post-procedure vital signs reviewed and stable Respiratory status: spontaneous breathing, nonlabored ventilation, respiratory function stable and patient connected to nasal cannula oxygen Cardiovascular status: stable and blood pressure returned to baseline Postop Assessment: no apparent nausea or vomiting Anesthetic complications: no   No notable events documented.  Martha Clan

## 2022-05-04 NOTE — Anesthesia Preprocedure Evaluation (Signed)
Anesthesia Evaluation  Patient identified by MRN, date of birth, ID band Patient awake    Reviewed: Allergy & Precautions, H&P , NPO status , Patient's Chart, lab work & pertinent test results, reviewed documented beta blocker date and time   History of Anesthesia Complications Negative for: history of anesthetic complications  Airway Mallampati: III  TM Distance: >3 FB Neck ROM: full    Dental no notable dental hx. (+) Edentulous Upper, Upper Dentures, Dental Advidsory Given   Pulmonary neg shortness of breath, neg sleep apnea, COPD (mild), neg recent URI, Current Smoker,    Pulmonary exam normal breath sounds clear to auscultation       Cardiovascular Exercise Tolerance: Good (-) hypertension(-) angina+ Peripheral Vascular Disease  (-) CAD, (-) Past MI, (-) Cardiac Stents and (-) CABG Normal cardiovascular exam(-) dysrhythmias (-) Valvular Problems/Murmurs Rhythm:regular Rate:Normal     Neuro/Psych Seizures - (many years ago, no longer on medication),  PSYCHIATRIC DISORDERS Anxiety Depression  Neuromuscular disease (fibromyalgia) CVA, Residual Symptoms    GI/Hepatic Neg liver ROS, GERD  ,  Endo/Other  negative endocrine ROS  Renal/GU negative Renal ROS  negative genitourinary   Musculoskeletal   Abdominal   Peds  Hematology negative hematology ROS (+)   Anesthesia Other Findings Past Medical History:   Depression                                                   Anxiety                                                      Hypotension                                                  Right arm weakness                                           Stroke (HCC)                                                 COPD (chronic obstructive pulmonary disease) (*              Situational syncope                                          Seizures (HCC)                                               IBS (irritable bowel syndrome)  Reproductive/Obstetrics negative OB ROS                             Anesthesia Physical  Anesthesia Plan  ASA: 2  Anesthesia Plan: MAC   Post-op Pain Management:    Induction: Intravenous  PONV Risk Score and Plan: 1 and Midazolam and Treatment may vary due to age or medical condition  Airway Management Planned: Natural Airway and Nasal Cannula  Additional Equipment:   Intra-op Plan:   Post-operative Plan:   Informed Consent: I have reviewed the patients History and Physical, chart, labs and discussed the procedure including the risks, benefits and alternatives for the proposed anesthesia with the patient or authorized representative who has indicated his/her understanding and acceptance.     Dental Advisory Given  Plan Discussed with: Anesthesiologist, CRNA and Surgeon  Anesthesia Plan Comments:         Anesthesia Quick Evaluation

## 2022-05-04 NOTE — Op Note (Signed)
OPERATIVE NOTE  Lindsey Bautista [id number] 05/04/2022   PREOPERATIVE DIAGNOSIS: Nuclear sclerotic cataract right eye. H25.11   POSTOPERATIVE DIAGNOSIS: Nuclear sclerotic cataract right eye. H25.11   PROCEDURE:  Phacoemusification with posterior chamber intraocular lens placement of the right eye  Ultrasound time: Procedure(s): CATARACT EXTRACTION PHACO AND INTRAOCULAR LENS PLACEMENT (IOC) RIGHT 3.89 00:47.4 (Right)  LENS:   Implant Name Type Inv. Item Serial No. Manufacturer Lot No. LRB No. Used Action  LENS IOL TECNIS EYHANCE 24.0 - [id number] Intraocular Lens LENS IOL TECNIS EYHANCE 24.0 [id number] [employer]  Right 1 Implanted      SURGEON:  Courtney Heys. Lazarus Salines, MD   ANESTHESIA:  Topical with tetracaine drops, augmented with 1% preservative-free intracameral lidocaine.   COMPLICATIONS:  None.   DESCRIPTION OF PROCEDURE:  The patient was identified in the holding room and transported to the operating room and placed in the supine position under the operating microscope.  The right eye was identified as the operative eye, which was prepped and draped in the usual sterile ophthalmic fashion.   A 1 millimeter clear-corneal paracentesis was made superotemporally. Preservative-free 1% lidocaine mixed with 1:1,000 bisulfite-free aqueous solution of epinephrine was injected into the anterior chamber. The anterior chamber was then filled with Viscoat viscoelastic. A 2.4 millimeter keratome was used to make a clear-corneal incision inferotemporally. A curvilinear capsulorrhexis was made with a cystotome and capsulorrhexis forceps. Balanced salt solution was used to hydrodissect and hydrodelineate the nucleus. Phacoemulsification was then used to remove the lens nucleus and epinucleus. The remaining cortex was then removed using the irrigation and aspiration handpiece. Provisc was then placed into the capsular bag to distend it for lens placement. A +24.00 D DIB00 intraocular lens was then injected  into the capsular bag. The remaining viscoelastic was aspirated.   Wounds were hydrated with balanced salt solution.  The anterior chamber was inflated to a physiologic pressure with balanced salt solution.  No wound leaks were noted. Vigamox was injected intracamerally.  Timolol and Brimonidine drops were applied to the eye.  The patient was taken to the recovery room in stable condition without complications of anesthesia or surgery.  [name] 05/04/2022, 10:59 AM

## 2022-05-04 NOTE — Anesthesia Postprocedure Evaluation (Signed)
Anesthesia Post Note  Patient: Lindsey Bautista  Procedure(s) Performed: CATARACT EXTRACTION PHACO AND INTRAOCULAR LENS PLACEMENT (IOC) RIGHT 3.89 00:47.4 (Right: Eye)     Anesthesia Post Evaluation No notable events documented.  Rogers Ditter

## 2022-05-04 NOTE — Transfer of Care (Signed)
Immediate Anesthesia Transfer of Care Note  Patient: Lindsey Bautista  Procedure(s) Performed: CATARACT EXTRACTION PHACO AND INTRAOCULAR LENS PLACEMENT (IOC) RIGHT 3.89 00:47.4 (Right: Eye)  Patient Location: PACU  Anesthesia Type: MAC  Level of Consciousness: awake, alert  and patient cooperative  Airway and Oxygen Therapy: Patient Spontanous Breathing and Patient connected to supplemental oxygen  Post-op Assessment: Post-op Vital signs reviewed, Patient's Cardiovascular Status Stable, Respiratory Function Stable, Patent Airway and No signs of Nausea or vomiting  Post-op Vital Signs: Reviewed and stable  Complications: No notable events documented.

## 2022-05-04 NOTE — H&P (Signed)
[facility]   Primary Care Physician:  Venita Lick, NP Ophthalmologist: Dr. Merleen Nicely  Pre-Procedure History & Physical: HPI:  Lindsey Bautista is a 61 y.o. female here for cataract surgery.   Past Medical History:  Diagnosis Date   Arthritis    Atypical chest pain    a. 08/2016 Neg MV.   COPD (chronic obstructive pulmonary disease) (HCC)    Coronary artery disease, non-occlusive    a.  LHC 06/19/2018: Mid LAD 50% with iFR 0.94   Depression    Diastolic dysfunction    a. 10/2016 Echo: EF 60-65%, no rwma, Gr1 DD, mild MR, nl LA size, nl RV fxn; b. 05/2018 Echo: EF 60-65%. No rwma. Mild MR. Nl RV fxn.   Fibromyalgia    GERD (gastroesophageal reflux disease)    Hypotension    IBS (irritable bowel syndrome)    Right arm weakness    Situational syncope    Stroke ([place])    2013, 2018.  Some short term memory issues, some weakness   TIA (transient ischemic attack)    Wears dentures    full upper    Past Surgical History:  Procedure Laterality Date   COLONOSCOPY WITH PROPOFOL N/A 11/22/2015   Procedure: COLONOSCOPY WITH PROPOFOL;  Surgeon: Manya Silvas, MD;  Location: [facility] ENDOSCOPY;  Service: Endoscopy;  Laterality: N/A;   ESOPHAGOGASTRODUODENOSCOPY (EGD) WITH PROPOFOL N/A 11/22/2015   Procedure: ESOPHAGOGASTRODUODENOSCOPY (EGD) WITH PROPOFOL;  Surgeon: Manya Silvas, MD;  Location: [facility] ENDOSCOPY;  Service: Endoscopy;  Laterality: N/A;   INTRAVASCULAR PRESSURE WIRE/FFR STUDY N/A 06/19/2018   Procedure: INTRAVASCULAR PRESSURE WIRE/FFR STUDY;  Surgeon: Nelva Bush, MD;  Location: [facility];  Service: Cardiovascular;  Laterality: N/A;   LEFT HEART CATH AND CORONARY ANGIOGRAPHY N/A 06/19/2018   Procedure: LEFT HEART CATH AND CORONARY ANGIOGRAPHY;  Surgeon: Nelva Bush, MD;  Location: [facility];  Service: Cardiovascular;  Laterality: N/A;   OOPHORECTOMY     TONSILLECTOMY     VAGINAL HYSTERECTOMY      Prior to Admission medications    Medication Sig Start Date End Date Taking? Authorizing Provider  aspirin 81 MG tablet Take 81 mg by mouth daily.   Yes [provider]  atorvastatin (LIPITOR) 80 MG tablet Take 1 tablet by mouth once daily 01/09/22  Yes [name], Jolene T, NP  clonazePAM (KLONOPIN) 0.5 MG tablet Take 2 tablets (1 mg total) by mouth 2 (two) times daily as needed for anxiety. 11/07/21  Yes [name], Jolene T, NP  dimenhyDRINATE (DRAMAMINE) 50 MG tablet Take 50 mg by mouth every 8 (eight) hours as needed.   Yes [provider]  docusate sodium (COLACE) 100 MG capsule Take 100 mg by mouth daily.    Yes [provider]  EPINEPHrine 0.3 mg/0.3 mL IJ SOAJ injection Inject 0.3 mg into the muscle as needed for anaphylaxis. 10/18/20  Yes [name], Henrine Screws T, NP  estradiol (ESTRACE) 0.5 MG tablet Take 1 tablet by mouth once daily 01/09/22  Yes [name], Jolene T, NP  ezetimibe (ZETIA) 10 MG tablet Take 1 tablet by mouth once daily 01/09/22  Yes [name], Jolene T, NP  FLUoxetine (PROZAC) 40 MG capsule Take 1 capsule by mouth once daily 01/09/22  Yes [name], Jolene T, NP  ibuprofen (ADVIL) 800 MG tablet Take 800 mg by mouth every 8 (eight) hours as needed.   Yes [provider]  LINZESS 72 MCG capsule TAKE 1 CAPSULE BY MOUTH ONCE DAILY BEFORE BREAKFAST 01/09/22  Yes [name], Jolene T, NP  nitroGLYCERIN (NITROSTAT) 0.4 MG SL tablet Place 1 tablet (0.4 mg total) under the tongue every 5 (five) minutes as needed for chest pain. 05/03/21  Yes Gollan, Kathlene November, MD  omeprazole (PRILOSEC) 20 MG capsule Take 20 mg by mouth daily.   Yes [provider]  clonazePAM (KLONOPIN) 1 MG tablet Take 1 tablet by mouth twice daily 03/03/22   [name], Henrine Screws T, NP  gabapentin (NEURONTIN) 600 MG tablet Take 0.5 tablets (300 mg total) by mouth at bedtime. Patient not taking: Reported on 05/01/2022 02/10/22   Marnee Guarneri T, NP    Allergies as of 02/22/2022 - Review Complete 02/10/2022  Allergen Reaction Noted    Flagyl [metronidazole] Anaphylaxis 05/09/2019   Penicillin g Anaphylaxis and Other (See Comments) 12/17/2014   Iodinated contrast media Rash and Nausea And Vomiting 04/28/2014   Alpha-gal  02/21/2022   Flomax [tamsulosin hcl] Hives 03/12/2019    Family History  Problem Relation Age of Onset   Bladder Cancer Mother 21   Heart failure Mother    Renal Disease Mother    COPD Father    Bladder Cancer Maternal Aunt    Bladder Cancer Maternal Aunt    Breast cancer Maternal Aunt    Breast cancer Paternal Aunt 26   Colon cancer Maternal Grandmother    Cancer Paternal Grandfather        unk primary, metastatic, d 10s   Heart disease Other    COPD Other    Stroke Other    Prostate cancer Cousin        metastatic   Prostate cancer Cousin     Social History   Socioeconomic History   Marital status: Married    Spouse name: Jenetta Downer Swab   Number of children: 2   Years of education: 12   Highest education level: High school graduate  Occupational History   Occupation: Unemployed  Tobacco Use   Smoking status: Every Day    Packs/day: 0.75    Years: 45.00    Total pack years: 33.75    Types: Cigarettes   Smokeless tobacco: Never   Tobacco comments:    Started smoking age 23  Vaping Use   Vaping Use: Never used  Substance and Sexual Activity   Alcohol use: No    Alcohol/week: 0.0 standard drinks of alcohol   Drug use: No   Sexual activity: Not Currently    Birth control/protection: Surgical    Comment: Hysterectomy  Other Topics Concern   Not on file  Social History Narrative   Lives at home with husband.    Caffeine use: 1 cup coffee per day   Social Determinants of Health   Financial Resource Strain: Low Risk  (10/06/2021)   Overall Financial Resource Strain (CARDIA)    Difficulty of Paying Living Expenses: Not hard at all  Food Insecurity: No Food Insecurity (10/06/2021)   Hunger Vital Sign    Worried About Running Out of Food in the Last Year: Never true     Ran Out of Food in the Last Year: Never true  Transportation Needs: No Transportation Needs (10/06/2021)   PRAPARE - Hydrologist (Medical): No    Lack of Transportation (Non-Medical): No  Physical Activity: Inactive (10/06/2021)   Exercise Vital Sign    Days of Exercise per Week: 0 days    Minutes of Exercise per Session: 0 min  Stress: No Stress Concern Present (10/06/2021)   [employer]  of Occupational Health - Occupational Stress Questionnaire    Feeling of Stress : Not at all  Social Connections: Moderately Isolated (10/06/2021)   Social Connection and Isolation Panel [NHANES]    Frequency of Communication with Friends and Family: More than three times a week    Frequency of Social Gatherings with Friends and Family: Once a week    Attends Religious Services: Never    Marine scientist or Organizations: No    Attends Archivist Meetings: Never    Marital Status: Married  Human resources officer Violence: Not At Risk (10/06/2021)   Humiliation, Afraid, Rape, and Kick questionnaire    Fear of Current or Ex-Partner: No    Emotionally Abused: No    Physically Abused: No    Sexually Abused: No    Review of Systems: See HPI, otherwise negative ROS  Physical Exam: Ht '5\' 1"'$  (1.549 m)   Wt 67.1 kg   BMI 27.96 kg/m  General:   Alert, cooperative in NAD Head:  Normocephalic and atraumatic. Respiratory:  Normal work of breathing. Cardiovascular:  RRR  Impression/Plan: Lindsey Bautista is here for cataract surgery.  Risks, benefits, limitations, and alternatives regarding cataract surgery have been reviewed with the patient.  Questions have been answered.  All parties agreeable.   Norvel Richards, MD  05/04/2022, 7:09 AM

## 2022-05-05 ENCOUNTER — Encounter: Payer: Self-pay | Admitting: Ophthalmology

## 2022-05-05 ENCOUNTER — Other Ambulatory Visit: Payer: Self-pay

## 2022-05-17 NOTE — Discharge Instructions (Signed)

## 2022-05-18 ENCOUNTER — Ambulatory Visit
Admission: RE | Admit: 2022-05-18 | Discharge: 2022-05-18 | Disposition: A | Discharge status: Home / Self Care | Payer: Medicare Other | Attending: Ophthalmology | Admitting: Ophthalmology

## 2022-05-18 ENCOUNTER — Encounter: Payer: Self-pay | Admitting: Ophthalmology

## 2022-05-18 ENCOUNTER — Ambulatory Visit (AMBULATORY_SURGERY_CENTER): Payer: Medicare Other | Admitting: Anesthesiology

## 2022-05-18 ENCOUNTER — Ambulatory Visit: Payer: Medicare Other | Admitting: Anesthesiology

## 2022-05-18 ENCOUNTER — Other Ambulatory Visit: Payer: Self-pay

## 2022-05-18 ENCOUNTER — Encounter
Admission: RE | Disposition: A | Discharge status: Home / Self Care | Payer: Self-pay | Source: Home / Self Care | Attending: Ophthalmology

## 2022-05-18 DIAGNOSIS — I69351 Hemiplegia and hemiparesis following cerebral infarction affecting right dominant side: Secondary | ICD-10-CM | POA: Insufficient documentation

## 2022-05-18 DIAGNOSIS — M797 Fibromyalgia: Secondary | ICD-10-CM | POA: Insufficient documentation

## 2022-05-18 DIAGNOSIS — I2510 Atherosclerotic heart disease of native coronary artery without angina pectoris: Secondary | ICD-10-CM | POA: Diagnosis not present

## 2022-05-18 DIAGNOSIS — J449 Chronic obstructive pulmonary disease, unspecified: Secondary | ICD-10-CM | POA: Insufficient documentation

## 2022-05-18 DIAGNOSIS — F418 Other specified anxiety disorders: Secondary | ICD-10-CM | POA: Insufficient documentation

## 2022-05-18 DIAGNOSIS — F17200 Nicotine dependence, unspecified, uncomplicated: Secondary | ICD-10-CM | POA: Insufficient documentation

## 2022-05-18 DIAGNOSIS — K589 Irritable bowel syndrome without diarrhea: Secondary | ICD-10-CM | POA: Diagnosis not present

## 2022-05-18 DIAGNOSIS — K219 Gastro-esophageal reflux disease without esophagitis: Secondary | ICD-10-CM | POA: Insufficient documentation

## 2022-05-18 DIAGNOSIS — F17210 Nicotine dependence, cigarettes, uncomplicated: Secondary | ICD-10-CM | POA: Diagnosis not present

## 2022-05-18 DIAGNOSIS — H2512 Age-related nuclear cataract, left eye: Secondary | ICD-10-CM

## 2022-05-18 HISTORY — PX: CATARACT EXTRACTION W/PHACO: SHX586

## 2022-05-18 SURGERY — PHACOEMULSIFICATION, CATARACT, WITH IOL INSERTION
Anesthesia: Monitor Anesthesia Care | Site: Eye | Laterality: Left

## 2022-05-18 MED ORDER — ARMC OPHTHALMIC DILATING DROPS
1.0000 | OPHTHALMIC | Status: DC | PRN
  Administered 2022-05-18 (×3): 1 via OPHTHALMIC

## 2022-05-18 MED ORDER — SIGHTPATH DOSE#1 BSS IO SOLN
INTRAOCULAR | Status: DC | PRN
  Administered 2022-05-18: 107 mL via OPHTHALMIC

## 2022-05-18 MED ORDER — MOXIFLOXACIN HCL 0.5 % OP SOLN
OPHTHALMIC | Status: DC | PRN
  Administered 2022-05-18: 0.2 mL via OPHTHALMIC

## 2022-05-18 MED ORDER — SIGHTPATH DOSE#1 NA HYALUR & NA CHOND-NA HYALUR IO KIT
PACK | INTRAOCULAR | Status: DC | PRN
  Administered 2022-05-18: 1 via OPHTHALMIC

## 2022-05-18 MED ORDER — LIDOCAINE HCL (PF) 2 % IJ SOLN
INTRAOCULAR | Status: DC | PRN
  Administered 2022-05-18: 1 mL via INTRAOCULAR

## 2022-05-18 MED ORDER — DEXMEDETOMIDINE HCL IN NACL 80 MCG/20ML IV SOLN
INTRAVENOUS | Status: DC | PRN
  Administered 2022-05-18: 4 ug via BUCCAL

## 2022-05-18 MED ORDER — FENTANYL CITRATE (PF) 100 MCG/2ML IJ SOLN
INTRAMUSCULAR | Status: DC | PRN
  Administered 2022-05-18 (×2): 50 ug via INTRAVENOUS

## 2022-05-18 MED ORDER — MIDAZOLAM HCL 2 MG/2ML IJ SOLN
INTRAMUSCULAR | Status: DC | PRN
  Administered 2022-05-18 (×2): 1 mg via INTRAVENOUS

## 2022-05-18 MED ORDER — BRIMONIDINE TARTRATE-TIMOLOL 0.2-0.5 % OP SOLN
OPHTHALMIC | Status: DC | PRN
  Administered 2022-05-18: 1 [drp] via OPHTHALMIC

## 2022-05-18 MED ORDER — ONDANSETRON HCL 4 MG/2ML IJ SOLN: 4.0000 mg | Freq: Once | INTRAMUSCULAR | Status: DC | PRN

## 2022-05-18 MED ORDER — TETRACAINE HCL 0.5 % OP SOLN
1.0000 [drp] | OPHTHALMIC | Status: DC | PRN
  Administered 2022-05-18 (×3): 1 [drp] via OPHTHALMIC

## 2022-05-18 MED ORDER — SIGHTPATH DOSE#1 BSS IO SOLN
INTRAOCULAR | Status: DC | PRN
  Administered 2022-05-18: 15 mL

## 2022-05-18 SURGICAL SUPPLY — 13 items
CATARACT SUITE SIGHTPATH (MISCELLANEOUS) ×1 IMPLANT
DISSECTOR HYDRO NUCLEUS 50X22 (MISCELLANEOUS) ×1 IMPLANT
DRSG TEGADERM 2-3/8X2-3/4 SM (GAUZE/BANDAGES/DRESSINGS) ×1 IMPLANT
FEE CATARACT SUITE SIGHTPATH (MISCELLANEOUS) ×1 IMPLANT
GLOVE SURG SYN 7.5  E (GLOVE) ×1
GLOVE SURG SYN 7.5 E (GLOVE) ×1 IMPLANT
GLOVE SURG SYN 7.5 PF PI (GLOVE) ×1 IMPLANT
GLOVE SURG SYN 8.5  E (GLOVE) ×1
GLOVE SURG SYN 8.5 E (GLOVE) ×1 IMPLANT
GLOVE SURG SYN 8.5 PF PI (GLOVE) ×1 IMPLANT
LENS IOL TECNIS EYHANCE 24.0 (Intraocular Lens) IMPLANT
TIP ITREPID SGL USE BENT I/A (SUCTIONS) IMPLANT
WATER STERILE IRR 250ML POUR (IV SOLUTION) ×1 IMPLANT

## 2022-05-18 NOTE — Anesthesia Preprocedure Evaluation (Signed)
Anesthesia Evaluation  Patient identified by MRN, date of birth, ID band Patient awake    Reviewed: Allergy & Precautions, H&P , NPO status , Patient's Chart, lab work & pertinent test results, reviewed documented beta blocker date and time   History of Anesthesia Complications Negative for: history of anesthetic complications  Airway Mallampati: III  TM Distance: >3 FB Neck ROM: full    Dental no notable dental hx. (+) Edentulous Upper, Upper Dentures, Dental Advidsory Given   Pulmonary neg shortness of breath, neg sleep apnea, COPD (mild), neg recent URI, Current SmokerPatient did not abstain from smoking.,    Pulmonary exam normal breath sounds clear to auscultation       Cardiovascular Exercise Tolerance: Good (-) hypertension(-) angina+ CAD and + Peripheral Vascular Disease  (-) Past MI, (-) Cardiac Stents and (-) CABG Normal cardiovascular exam(-) dysrhythmias (-) Valvular Problems/Murmurs Rhythm:regular Rate:Normal     Neuro/Psych Seizures - (many years ago, no longer on medication),  PSYCHIATRIC DISORDERS Anxiety Depression Residual R sided weakness  Neuromuscular disease (fibromyalgia) CVA, Residual Symptoms    GI/Hepatic Neg liver ROS, GERD  ,  Endo/Other  negative endocrine ROS  Renal/GU negative Renal ROS  negative genitourinary   Musculoskeletal   Abdominal   Peds  Hematology negative hematology ROS (+)   Anesthesia Other Findings Past Medical History:   Depression                                                   Anxiety                                                      Hypotension                                                  Right arm weakness                                           Stroke (HCC)                                                 COPD (chronic obstructive pulmonary disease) (*              Situational syncope                                          Seizures (HCC)                                                IBS (irritable bowel syndrome)  Reproductive/Obstetrics negative OB ROS                             Anesthesia Physical  Anesthesia Plan  ASA: 2  Anesthesia Plan: MAC   Post-op Pain Management:    Induction: Intravenous  PONV Risk Score and Plan: 1 and Midazolam and Treatment may vary due to age or medical condition  Airway Management Planned: Natural Airway and Nasal Cannula  Additional Equipment:   Intra-op Plan:   Post-operative Plan:   Informed Consent: I have reviewed the patients History and Physical, chart, labs and discussed the procedure including the risks, benefits and alternatives for the proposed anesthesia with the patient or authorized representative who has indicated his/her understanding and acceptance.     Dental Advisory Given  Plan Discussed with: Anesthesiologist, CRNA and Surgeon  Anesthesia Plan Comments: (Explained risks of anesthesia, including PONV, and rare emergencies such as cardiac events, respiratory problems, and allergic reactions, requiring invasive intervention. Discussed the role of CRNA in patient's perioperative care. Patient understands. )        Anesthesia Quick Evaluation

## 2022-05-18 NOTE — Anesthesia Postprocedure Evaluation (Signed)
Anesthesia Post Note  Patient: Lindsey Bautista  Procedure(s) Performed: CATARACT EXTRACTION PHACO AND INTRAOCULAR LENS PLACEMENT (IOC) LEFT 1.44 00:19.7 (Left: Eye)     Patient location during evaluation: PACU Anesthesia Type: MAC Level of consciousness: awake and alert Pain management: pain level controlled Vital Signs Assessment: post-procedure vital signs reviewed and stable Respiratory status: spontaneous breathing, nonlabored ventilation, respiratory function stable and patient connected to nasal cannula oxygen Cardiovascular status: stable and blood pressure returned to baseline Postop Assessment: no apparent nausea or vomiting Anesthetic complications: no   No notable events documented.  Arita Miss

## 2022-05-18 NOTE — Transfer of Care (Signed)
Immediate Anesthesia Transfer of Care Note  Patient: Lindsey Bautista  Procedure(s) Performed: CATARACT EXTRACTION PHACO AND INTRAOCULAR LENS PLACEMENT (IOC) LEFT 1.44 00:19.7 (Left: Eye)  Patient Location: PACU  Anesthesia Type:MAC  Level of Consciousness: awake, alert  and oriented  Airway & Oxygen Therapy: Patient Spontanous Breathing  Post-op Assessment: Report given to RN and Post -op Vital signs reviewed and stable  Post vital signs: Reviewed and stable  Last Vitals:  Vitals Value Taken Time  BP 136/75 05/18/22 1446  Temp 36.1 C 05/18/22 1446  Pulse 66 05/18/22 1449  Resp 16 05/18/22 1449  SpO2 94 % 05/18/22 1449  Vitals shown include unvalidated device data.  Last Pain:  Vitals:   05/18/22 1446  TempSrc:   PainSc: 0-No pain         Complications: No notable events documented.

## 2022-05-18 NOTE — Op Note (Signed)
OPERATIVE NOTE  Lindsey Bautista [id number] 05/18/2022   PREOPERATIVE DIAGNOSIS: Nuclear sclerotic cataract left eye. H25.12   POSTOPERATIVE DIAGNOSIS: Nuclear sclerotic cataract left eye. H25.12   PROCEDURE:  Phacoemusification with posterior chamber intraocular lens placement of the left eye  Ultrasound time: Procedure(s): CATARACT EXTRACTION PHACO AND INTRAOCULAR LENS PLACEMENT (IOC) LEFT 1.44 00:19.7 (Left)  LENS:   Implant Name Type Inv. Item Serial No. Manufacturer Lot No. LRB No. Used Action  LENS IOL TECNIS EYHANCE 24.0 - [id number] Intraocular Lens LENS IOL TECNIS EYHANCE 24.0 [id number] [employer]  Left 1 Implanted      SURGEON:  Courtney Heys. Lazarus Salines, MD   ANESTHESIA:  Topical with tetracaine drops, augmented with 1% preservative-free intracameral lidocaine.   COMPLICATIONS:  None.   DESCRIPTION OF PROCEDURE:  The patient was identified in the holding room and transported to the operating room and placed in the supine position under the operating microscope.  The left eye was identified as the operative eye, which was prepped and draped in the usual sterile ophthalmic fashion.   A 1 millimeter clear-corneal paracentesis was made inferotemporally. Preservative-free 1% lidocaine mixed with 1:1,000 bisulfite-free aqueous solution of epinephrine was injected into the anterior chamber. The anterior chamber was then filled with Viscoat viscoelastic. A 2.4 millimeter keratome was used to make a clear-corneal incision superotemporally. A curvilinear capsulorrhexis was made with a cystotome and capsulorrhexis forceps. Balanced salt solution was used to hydrodissect and hydrodelineate the nucleus. Phacoemulsification was then used to remove the lens nucleus and epinucleus. The remaining cortex was then removed using the irrigation and aspiration handpiece. Provisc was then placed into the capsular bag to distend it for lens placement. A +24.00 D DIB00 intraocular lens was then injected into the  capsular bag. The remaining viscoelastic was aspirated.   Wounds were hydrated with balanced salt solution.  The anterior chamber was inflated to a physiologic pressure with balanced salt solution.  No wound leaks were noted. Vigamox was injected intracamerally.  Timolol and Brimonidine drops were applied to the eye.  The patient was taken to the recovery room in stable condition without complications of anesthesia or surgery.  [name] 05/18/2022, 2:44 PM

## 2022-05-18 NOTE — H&P (Signed)
[facility]   Primary Care Physician:  Venita Lick, NP Ophthalmologist: Dr. Merleen Nicely  Pre-Procedure History & Physical: HPI:  Lindsey Bautista is a 61 y.o. female here for cataract surgery.   Past Medical History:  Diagnosis Date   Arthritis    Atypical chest pain    a. 08/2016 Neg MV.   COPD (chronic obstructive pulmonary disease) (HCC)    Coronary artery disease, non-occlusive    a.  LHC 06/19/2018: Mid LAD 50% with iFR 0.94   Depression    Diastolic dysfunction    a. 10/2016 Echo: EF 60-65%, no rwma, Gr1 DD, mild MR, nl LA size, nl RV fxn; b. 05/2018 Echo: EF 60-65%. No rwma. Mild MR. Nl RV fxn.   Fibromyalgia    GERD (gastroesophageal reflux disease)    Hypotension    IBS (irritable bowel syndrome)    Right arm weakness    Situational syncope    Stroke ([place])    2013, 2018.  Some short term memory issues, some weakness   TIA (transient ischemic attack)    Wears dentures    full upper    Past Surgical History:  Procedure Laterality Date   CATARACT EXTRACTION W/PHACO Right 05/04/2022   Procedure: CATARACT EXTRACTION PHACO AND INTRAOCULAR LENS PLACEMENT (IOC) RIGHT 3.89 00:47.4;  Surgeon: Norvel Richards, MD;  Location: [facility];  Service: Ophthalmology;  Laterality: Right;   COLONOSCOPY WITH PROPOFOL N/A 11/22/2015   Procedure: COLONOSCOPY WITH PROPOFOL;  Surgeon: Manya Silvas, MD;  Location[facility] ENDOSCOPY;  Service: Endoscopy;  Laterality: N/A;   ESOPHAGOGASTRODUODENOSCOPY (EGD) WITH PROPOFOL N/A 11/22/2015   Procedure: ESOPHAGOGASTRODUODENOSCOPY (EGD) WITH PROPOFOL;  Surgeon: Manya Silvas, MD;  Location: [facility] ENDOSCOPY;  Service: Endoscopy;  Laterality: N/A;   INTRAVASCULAR PRESSURE WIRE/FFR STUDY N/A 06/19/2018   Procedure: INTRAVASCULAR PRESSURE WIRE/FFR STUDY;  Surgeon: Nelva Bush, MD;  Location: [facility];  Service: Cardiovascular;  Laterality: N/A;   LEFT HEART CATH AND CORONARY ANGIOGRAPHY N/A 06/19/2018    Procedure: LEFT HEART CATH AND CORONARY ANGIOGRAPHY;  Surgeon: Nelva Bush, MD;  Location: [place];  Service: Cardiovascular;  Laterality: N/A;   OOPHORECTOMY     TONSILLECTOMY     VAGINAL HYSTERECTOMY      Prior to Admission medications   Medication Sig Start Date End Date Taking? Authorizing Provider  aspirin 81 MG tablet Take 81 mg by mouth daily.    [provider]  atorvastatin (LIPITOR) 80 MG tablet Take 1 tablet by mouth once daily 01/09/22   [name], Jolene T, NP  clonazePAM (KLONOPIN) 0.5 MG tablet Take 2 tablets (1 mg total) by mouth 2 (two) times daily as needed for anxiety. 11/07/21   Marnee Guarneri T, NP  clonazePAM (KLONOPIN) 1 MG tablet Take 1 tablet by mouth twice daily 03/03/22   [name], Henrine Screws T, NP  dimenhyDRINATE (DRAMAMINE) 50 MG tablet Take 50 mg by mouth every 8 (eight) hours as needed.    [provider]  docusate sodium (COLACE) 100 MG capsule Take 100 mg by mouth daily.     [provider]  EPINEPHrine 0.3 mg/0.3 mL IJ SOAJ injection Inject 0.3 mg into the muscle as needed for anaphylaxis. 10/18/20   Marnee Guarneri T, NP  estradiol (ESTRACE) 0.5 MG tablet Take 1 tablet by mouth once daily 01/09/22   Marnee Guarneri T, NP  ezetimibe (ZETIA) 10 MG tablet Take 1 tablet by mouth once daily 01/09/22   Venita Lick, NP  FLUoxetine (PROZAC) 40 MG capsule Take 1 capsule by mouth once daily 01/09/22   [name], Henrine Screws T, NP  gabapentin (NEURONTIN) 600 MG tablet Take 0.5 tablets (300 mg total) by mouth at bedtime. Patient not taking: Reported on 05/01/2022 02/10/22   Marnee Guarneri T, NP  ibuprofen (ADVIL) 800 MG tablet Take 800 mg by mouth every 8 (eight) hours as needed.    [provider]  LINZESS 72 MCG capsule TAKE 1 CAPSULE BY MOUTH ONCE DAILY BEFORE BREAKFAST 01/09/22   [name], Jolene T, NP  nitroGLYCERIN (NITROSTAT) 0.4 MG SL tablet Place 1 tablet (0.4 mg total) under the tongue every 5 (five) minutes as needed for  chest pain. 05/03/21   Minna Merritts, MD  omeprazole (PRILOSEC) 20 MG capsule Take 20 mg by mouth daily.    [provider]    Allergies as of 02/22/2022 - Review Complete 02/10/2022  Allergen Reaction Noted   Flagyl [metronidazole] Anaphylaxis 05/09/2019   Penicillin g Anaphylaxis and Other (See Comments) 12/17/2014   Iodinated contrast media Rash and Nausea And Vomiting 04/28/2014   Alpha-gal  02/21/2022   Flomax [tamsulosin hcl] Hives 03/12/2019    Family History  Problem Relation Age of Onset   Bladder Cancer Mother 30   Heart failure Mother    Renal Disease Mother    COPD Father    Bladder Cancer Maternal Aunt    Bladder Cancer Maternal Aunt    Breast cancer Maternal Aunt    Breast cancer Paternal Aunt 58   Colon cancer Maternal Grandmother    Cancer Paternal Grandfather        unk primary, metastatic, d 58s   Heart disease Other    COPD Other    Stroke Other    Prostate cancer Cousin        metastatic   Prostate cancer Cousin     Social History   Socioeconomic History   Marital status: Married    Spouse name: Jenetta Downer Carvey   Number of children: 2   Years of education: 12   Highest education level: High school graduate  Occupational History   Occupation: Unemployed  Tobacco Use   Smoking status: Every Day    Packs/day: 0.75    Years: 45.00    Total pack years: 33.75    Types: Cigarettes   Smokeless tobacco: Never   Tobacco comments:    Started smoking age 72  Vaping Use   Vaping Use: Never used  Substance and Sexual Activity   Alcohol use: No    Alcohol/week: 0.0 standard drinks of alcohol   Drug use: No   Sexual activity: Not Currently    Birth control/protection: Surgical    Comment: Hysterectomy  Other Topics Concern   Not on file  Social History Narrative   Lives at home with husband.    Caffeine use: 1 cup coffee per day   Social Determinants of Health   Financial Resource Strain: Low Risk  (10/06/2021)   Overall  Financial Resource Strain (CARDIA)    Difficulty of Paying Living Expenses: Not hard at all  Food Insecurity: No Food Insecurity (10/06/2021)   Hunger Vital Sign    Worried About Running Out of Food in the Last Year: Never true    Ran Out of Food in the Last Year: Never true  Transportation Needs: No Transportation Needs (10/06/2021)   PRAPARE - Hydrologist (Medical): No    Lack of Transportation (Non-Medical): No  Physical Activity:  Inactive (10/06/2021)   Exercise Vital Sign    Days of Exercise per Week: 0 days    Minutes of Exercise per Session: 0 min  Stress: No Stress Concern Present (10/06/2021)   [place]    Feeling of Stress : Not at all  Social Connections: Moderately Isolated (10/06/2021)   Social Connection and Isolation Panel [NHANES]    Frequency of Communication with Friends and Family: More than three times a week    Frequency of Social Gatherings with Friends and Family: Once a week    Attends Religious Services: Never    Marine scientist or Organizations: No    Attends Archivist Meetings: Never    Marital Status: Married  Human resources officer Violence: Not At Risk (10/06/2021)   Humiliation, Afraid, Rape, and Kick questionnaire    Fear of Current or Ex-Partner: No    Emotionally Abused: No    Physically Abused: No    Sexually Abused: No    Review of Systems: See HPI, otherwise negative ROS  Physical Exam: There were no vitals taken for this visit. General:   Alert, cooperative in NAD Head:  Normocephalic and atraumatic. Respiratory:  Normal work of breathing. Cardiovascular:  RRR  Impression/Plan: Lindsey Bautista is here for cataract surgery.  Risks, benefits, limitations, and alternatives regarding cataract surgery have been reviewed with the patient.  Questions have been answered.  All parties agreeable.   Norvel Richards, MD  05/18/2022,  11:40 AM

## 2022-05-19 ENCOUNTER — Encounter: Payer: Self-pay | Admitting: Ophthalmology

## 2022-05-23 ENCOUNTER — Ambulatory Visit (INDEPENDENT_AMBULATORY_CARE_PROVIDER_SITE_OTHER): Payer: Medicare Other | Admitting: Physician Assistant

## 2022-05-23 ENCOUNTER — Encounter: Payer: Self-pay | Admitting: Physician Assistant

## 2022-05-23 DIAGNOSIS — N898 Other specified noninflammatory disorders of vagina: Secondary | ICD-10-CM | POA: Diagnosis not present

## 2022-05-23 DIAGNOSIS — R350 Frequency of micturition: Secondary | ICD-10-CM | POA: Diagnosis not present

## 2022-05-23 LAB — URINALYSIS, ROUTINE W REFLEX MICROSCOPIC
Bilirubin, UA: NEGATIVE
Glucose, UA: NEGATIVE
Ketones, UA: NEGATIVE
Leukocytes,UA: NEGATIVE
Nitrite, UA: NEGATIVE
Protein,UA: NEGATIVE
RBC, UA: NEGATIVE
Specific Gravity, UA: 1.02 (ref 1.005–1.030)
Urobilinogen, Ur: 1 mg/dL (ref 0.2–1.0)
pH, UA: 6.5 (ref 5.0–7.5)

## 2022-05-23 LAB — WET PREP FOR TRICH, YEAST, CLUE
Clue Cell Exam: NEGATIVE
Trichomonas Exam: NEGATIVE
Yeast Exam: NEGATIVE

## 2022-05-23 NOTE — Progress Notes (Unsigned)
Virtual Visit via Telephone Note  I connected with Lindsey Bautista on 05/24/22 at  1:40 PM EDT by telephone and verified that I am speaking with the correct person using two identifiers. Today's Provider: Talitha Givens, MHS, PA-C Introduced myself to the patient as a PA-C and provided education on APPs in clinical practice.   Location: Patient: At home, [place], [place]  Provider: [facility], [place], [place]    I discussed the limitations, risks, security and privacy concerns of performing an evaluation and management service by telephone and the availability of in person appointments. I also discussed with the patient that there may be a patient responsible charge related to this service. The patient expressed understanding and agreed to proceed.  Chief Complaint  Patient presents with   odor    Started over a week ago. With discharge, frequency and nasuea     History of Present Illness:  Reports this has been ongoing for about a week  Reports she is having lower abdominal pain, increased urinary frequency, hematuria, nausea, reports lower back pain, increased urgency Denies fevers, vomiting She reports she has been told she had kidney stones in the past but has not had any issues with this  She is amenable to coming in to office for wet prep and UA    Observations/Objective:  Observations and PE are limited due to telephonic nature of visit Patient sounds alert and oriented during conversation  She is able to engage in conversation without evidence of respiratory distress   Urinalysis    Component Value Date/Time   COLORURINE YELLOW (A) 12/17/2019 1358   APPEARANCEUR Clear 05/23/2022 1437   LABSPEC 1.013 12/17/2019 1358   LABSPEC 1.005 04/27/2014 1404   PHURINE 5.0 12/17/2019 1358   GLUCOSEU Negative 05/23/2022 1437   GLUCOSEU Negative 04/27/2014 1404   HGBUR NEGATIVE 12/17/2019 1358   BILIRUBINUR Negative 05/23/2022 1437   BILIRUBINUR Negative 04/27/2014 1404    KETONESUR NEGATIVE 12/17/2019 1358   PROTEINUR Negative 05/23/2022 [place] 12/17/2019 1358   NITRITE Negative 05/23/2022 1437   NITRITE NEGATIVE 12/17/2019 1358   LEUKOCYTESUR Negative 05/23/2022 [place] 12/17/2019 1358   LEUKOCYTESUR Negative 04/27/2014 1404       Assessment and Plan:   Problem List Items Addressed This Visit   None Visit Diagnoses     Frequency of urination    -  Primary Acute, ongoing for almost a week Reports some lower abdominal pain and hematuria Will check UA and wet prep for potential cause- UA was negative for signs of UTI or blood in urine Wet prep was negative for trichomonas, BV and yeast  Recommend she stay well hydrated and avoid holding urine for prolonged periods of time If symptoms persist may need to investigate for kidney stone passage with KUB or CT stone study Follow up as needed for persistent or progressing symptoms     Relevant Orders   Urine Culture   Urinalysis, Routine w reflex microscopic   Vaginal discharge     See urinary frequency A&P for management plan    Relevant Orders   Urine Culture   WET PREP FOR TRICH, YEAST, CLUE      Follow Up Instructions:    I discussed the assessment and treatment plan with the patient. The patient was provided an opportunity to ask questions and all were answered. The patient agreed with the plan and demonstrated an understanding of the instructions.   The patient was advised  to call back or seek an in-person evaluation if the symptoms worsen or if the condition fails to improve as anticipated.  I provided 9 minutes of non-face-to-face time during this encounter.  No follow-ups on file.   I, Izumi Mixon E Yani Lal, PA-C, have reviewed all documentation for this visit. The documentation on 05/24/22 for the exam, diagnosis, procedures, and orders are all accurate and complete.   Talitha Givens, MHS, PA-C [facility]

## 2022-05-25 LAB — URINE CULTURE: Organism ID, Bacteria: NO GROWTH

## 2022-06-02 DIAGNOSIS — M3501 Sicca syndrome with keratoconjunctivitis: Secondary | ICD-10-CM | POA: Diagnosis not present

## 2022-06-02 DIAGNOSIS — H01023 Squamous blepharitis right eye, unspecified eyelid: Secondary | ICD-10-CM | POA: Diagnosis not present

## 2022-06-02 DIAGNOSIS — Z961 Presence of intraocular lens: Secondary | ICD-10-CM | POA: Diagnosis not present

## 2022-06-05 ENCOUNTER — Other Ambulatory Visit: Payer: Self-pay | Admitting: Nurse Practitioner

## 2022-06-05 DIAGNOSIS — F419 Anxiety disorder, unspecified: Secondary | ICD-10-CM

## 2022-06-05 NOTE — Telephone Encounter (Signed)
Medication Refill - Medication: clonazePAM (KLONOPIN) 1 MG tablet  Has the patient contacted their pharmacy? Yes.    Preferred Pharmacy (with phone number or street name):  [facility] [place] - [address] Phone:  [phone]  Fax:  [phone]     Has the patient been seen for an appointment in the last year OR does the patient have an upcoming appointment? Yes.    Agent: Please be advised that RX refills may take up to 3 business days. We ask that you follow-up with your pharmacy.

## 2022-06-06 ENCOUNTER — Other Ambulatory Visit: Payer: Self-pay

## 2022-06-06 DIAGNOSIS — Z122 Encounter for screening for malignant neoplasm of respiratory organs: Secondary | ICD-10-CM

## 2022-06-06 DIAGNOSIS — F17210 Nicotine dependence, cigarettes, uncomplicated: Secondary | ICD-10-CM

## 2022-06-06 DIAGNOSIS — Z87891 Personal history of nicotine dependence: Secondary | ICD-10-CM

## 2022-06-06 NOTE — Telephone Encounter (Signed)
Requested medication (s) are due for refill today:   Provider to review  Requested medication (s) are on the active medication list:   Yes  Future visit scheduled:   No   Last ordered: 11/07/2021 #120, 0 refills   and  06/05/2022 #60, 2 refills  Returned because it's a non delegated refill   Requested Prescriptions  Pending Prescriptions Disp Refills   clonazePAM (KLONOPIN) 1 MG tablet [Pharmacy Med Name: clonazePAM 1 MG Oral Tablet] 60 tablet 0    Sig: Take 1 tablet by mouth twice daily     Not Delegated - Psychiatry: Anxiolytics/Hypnotics 2 Failed - 06/05/2022  9:56 AM      Failed - This refill cannot be delegated      Passed - Urine Drug Screen completed in last 360 days      Passed - Patient is not pregnant      Passed - Valid encounter within last 6 months    Recent Outpatient Visits           2 weeks ago Frequency of urination   [place], PA-C   3 months ago Unintentional weight loss   [employer], [place] T, NP   3 months ago Depression, recurrent ([place])   [place], Jolene T, NP   6 months ago Centrilobular emphysema ([place])   [place], Jolene T, NP   8 months ago Centrilobular emphysema ([place])   [place], Barbaraann Faster, NP

## 2022-06-06 NOTE — Telephone Encounter (Signed)
Called patient to schedule a follow up visit with Jolene. Unable to schedule an appointment

## 2022-06-06 NOTE — Telephone Encounter (Signed)
Requested medications are due for refill today.  yes  Requested medications are on the active medications list.  yes  Last refill. 03/03/2022 #60 2 rf  Future visit scheduled.   no  Notes to clinic.  Refill not delegated.    Requested Prescriptions  Pending Prescriptions Disp Refills   clonazePAM (KLONOPIN) 1 MG tablet 60 tablet 2    Sig: Take 1 tablet (1 mg total) by mouth 2 (two) times daily.     Not Delegated - Psychiatry: Anxiolytics/Hypnotics 2 Failed - 06/05/2022 11:09 AM      Failed - This refill cannot be delegated      Passed - Urine Drug Screen completed in last 360 days      Passed - Patient is not pregnant      Passed - Valid encounter within last 6 months    Recent Outpatient Visits           2 weeks ago Frequency of urination   [place], PA-C   3 months ago Unintentional weight loss   [place], [place] T, NP   3 months ago Depression, recurrent ([place])   [place], Jolene T, NP   6 months ago Centrilobular emphysema ([place])   [place], Jolene T, NP   8 months ago Centrilobular emphysema ([place])   [place], Barbaraann Faster, NP

## 2022-06-09 ENCOUNTER — Ambulatory Visit (INDEPENDENT_AMBULATORY_CARE_PROVIDER_SITE_OTHER): Payer: Medicare Other | Admitting: Nurse Practitioner

## 2022-06-09 ENCOUNTER — Encounter: Payer: Self-pay | Admitting: Nurse Practitioner

## 2022-06-09 VITALS — BP 137/71 | HR 66 | Temp 98.4°F | Ht 61.0 in | Wt 147.1 lb

## 2022-06-09 DIAGNOSIS — F1320 Sedative, hypnotic or anxiolytic dependence, uncomplicated: Secondary | ICD-10-CM

## 2022-06-09 DIAGNOSIS — F447 Conversion disorder with mixed symptom presentation: Secondary | ICD-10-CM

## 2022-06-09 DIAGNOSIS — F419 Anxiety disorder, unspecified: Secondary | ICD-10-CM

## 2022-06-09 DIAGNOSIS — F339 Major depressive disorder, recurrent, unspecified: Secondary | ICD-10-CM | POA: Diagnosis not present

## 2022-06-09 DIAGNOSIS — I2510 Atherosclerotic heart disease of native coronary artery without angina pectoris: Secondary | ICD-10-CM

## 2022-06-09 MED ORDER — CLONAZEPAM 1 MG PO TABS: 1.0000 mg | ORAL_TABLET | Freq: Two times a day (BID) | ORAL | 2 refills | Status: DC

## 2022-06-09 MED ORDER — OMEPRAZOLE 20 MG PO CPDR: 20.0000 mg | DELAYED_RELEASE_CAPSULE | Freq: Two times a day (BID) | ORAL | 4 refills | Status: DC

## 2022-06-09 MED ORDER — EPINEPHRINE 0.3 MG/0.3ML IJ SOAJ: 0.3000 mg | INTRAMUSCULAR | 0 refills | Status: DC | PRN

## 2022-06-09 MED ORDER — NITROGLYCERIN 0.4 MG SL SUBL: 0.4000 mg | SUBLINGUAL_TABLET | SUBLINGUAL | 3 refills | Status: DC | PRN

## 2022-06-09 NOTE — Assessment & Plan Note (Signed)
Chronic, ongoing.  Continue Klonopin at this time, patient educated on risks of medication and is not interested in reducing -- refills sent in.  Continue Prozac 40 MG which benefits anxiety and fibromyalgia, discussed with her changing to Zoloft or Duloxetine (which would benefit chronic pain issues) if increase anxiety/depression noted.  At this time she denies SI/HI and wishes to maintain current regimen.  UDS yearly need - due 09/14/22, contract up to date.  Return in 3 months.

## 2022-06-09 NOTE — Assessment & Plan Note (Signed)
Chronic, ongoing.  Continue current medication regimen and adjust as needed.  Denies SI/HI.  Consider change from Prozac to Cymbalta in future, which would offer benefit with anxiety and depression + fibromyalgia/alpha gal -- she refuses this change today.  Very anxious about changes.

## 2022-06-09 NOTE — Assessment & Plan Note (Signed)
Refer to anxiety plan for updates and discussion. UDS due next 09/14/22.

## 2022-06-09 NOTE — Progress Notes (Signed)
BP 137/71   Pulse 66   Temp 98.4 F (36.9 C) (Oral)   Ht '5\' 1"'$  (1.549 m)   Wt 147 lb 1.6 oz (66.7 kg)   SpO2 96%   BMI 27.79 kg/m    Subjective:    Patient ID: Lindsey Bautista, female    DOB: 10-22-1960, 61 y.o.   MRN: [id number]  HPI: Lindsey Bautista is a 61 y.o. female  Chief Complaint  Patient presents with   Depression   DEPRESSION/ANXIETY Continues on Prozac 40 MG daily and Klonopin 1 MG BID.  Pt is aware of risks of benzo medication use to include increased sedation, respiratory suppression, falls, dependence and cardiovascular events.  Pt would like to continue treatment as benefit determined to outweigh risk.  On PDMP review last Klonopin fill 06/06/22 for 1 MG tablets.  No other controlled substances noted.   Mood status: stable Satisfied with current treatment?: yes Symptom severity: mild  Duration of current treatment : chronic Side effects: no Medication compliance: good compliance Psychotherapy/counseling: none Previous psychiatric medications: Prozac and Klonopin Depressed mood: occasional Anxious mood: yes Anhedonia: no Significant weight loss or gain: no Insomnia: yes hard to fall asleep  Fatigue: occasional Feelings of worthlessness or guilt: none Impaired concentration/indecisiveness: yes Suicidal ideations: no Hopelessness: no Crying spells: no    06/09/2022    1:40 PM 05/23/2022    1:37 PM 02/10/2022   11:18 AM 11/14/2021    2:01 PM 10/06/2021   12:22 PM  Depression screen PHQ 2/9  Decreased Interest 2 0 '2 2 3  '$ Down, Depressed, Hopeless 1 0 2 1 0  PHQ - 2 Score 3 0 '4 3 3  '$ Altered sleeping 2 0 3 3 0  Tired, decreased energy 2 0 '3 3 1  '$ Change in appetite 1 0 2 1 0  Feeling bad or failure about yourself  1 0 2 1 0  Trouble concentrating 2 0 3 2 0  Moving slowly or fidgety/restless 1 0 1 2 0  Suicidal thoughts 0 0 0 0 0  PHQ-9 Score 12 0 '18 15 4  '$ Difficult doing work/chores Somewhat difficult Not difficult at all Somewhat difficult Somewhat  difficult Not difficult at all      06/09/2022    1:40 PM 05/23/2022    1:37 PM 02/10/2022   11:19 AM 11/14/2021    2:01 PM  GAD 7 : Generalized Anxiety Score  Nervous, Anxious, on Edge '1  2 2  '$ Control/stop worrying 2 0 2 1  Worry too much - different things 2 0 2 2  Trouble relaxing 1 0 2 2  Restless 0 0 1 1  Easily annoyed or irritable 2 0 2 3  Afraid - awful might happen 1 0 2 1  Total GAD 7 Score '9  13 12  '$ Anxiety Difficulty Somewhat difficult Not difficult at all Somewhat difficult Somewhat difficult   Relevant past medical, surgical, family and social history reviewed and updated as indicated. Interim medical history since our last visit reviewed. Allergies and medications reviewed and updated.  Review of Systems  Constitutional:  Negative for activity change, appetite change, diaphoresis, fatigue and fever.  Respiratory:  Negative for cough, chest tightness, shortness of breath and wheezing.   Cardiovascular:  Negative for chest pain, palpitations and leg swelling.  Gastrointestinal: Negative.   Endocrine: Negative for cold intolerance and heat intolerance.  Musculoskeletal:  Positive for arthralgias.  Neurological: Negative.   Psychiatric/Behavioral:  Negative for decreased concentration, self-injury, sleep disturbance  and suicidal ideas. The patient is nervous/anxious.     Per HPI unless specifically indicated above     Objective:    BP 137/71   Pulse 66   Temp 98.4 F (36.9 C) (Oral)   Ht '5\' 1"'$  (1.549 m)   Wt 147 lb 1.6 oz (66.7 kg)   SpO2 96%   BMI 27.79 kg/m   Wt Readings from Last 3 Encounters:  06/09/22 147 lb 1.6 oz (66.7 kg)  05/18/22 145 lb 9.6 oz (66 kg)  05/04/22 146 lb (66.2 kg)    Physical Exam Vitals and nursing note reviewed.  Constitutional:      General: She is awake. She is not in acute distress.    Appearance: She is well-developed, well-groomed and overweight. She is not ill-appearing.  HENT:     Head: Normocephalic.     Right Ear:  Hearing normal.     Left Ear: Hearing normal.  Eyes:     General: Lids are normal.        Right eye: No discharge.        Left eye: No discharge.     Conjunctiva/sclera: Conjunctivae normal.     Pupils: Pupils are equal, round, and reactive to light.  Neck:     Thyroid: No thyromegaly.     Vascular: No carotid bruit.  Cardiovascular:     Rate and Rhythm: Normal rate and regular rhythm.     Heart sounds: Normal heart sounds. No murmur heard.    No gallop.  Pulmonary:     Effort: Pulmonary effort is normal. No accessory muscle usage or respiratory distress.     Breath sounds: Normal breath sounds.  Abdominal:     General: Bowel sounds are normal.     Palpations: Abdomen is soft.     Tenderness: There is no right CVA tenderness, left CVA tenderness, guarding or rebound.     Hernia: No hernia is present.  Musculoskeletal:     Cervical back: Normal range of motion and neck supple.     Right lower leg: No edema.     Left lower leg: No edema.  Skin:    General: Skin is warm and dry.  Neurological:     Mental Status: She is alert and oriented to person, place, and time.  Psychiatric:        Attention and Perception: Attention normal.        Mood and Affect: Mood normal.        Speech: Speech normal.        Behavior: Behavior normal. Behavior is cooperative.        Thought Content: Thought content normal.    Results for orders placed or performed in visit on 05/23/22  Urine Culture   Specimen: Urine   UR  Result Value Ref Range   Urine Culture, Routine Final report    Organism ID, Bacteria No growth   WET PREP FOR TRICH, YEAST, CLUE   Specimen: Sterile Swab   Sterile Swab  Result Value Ref Range   Trichomonas Exam Negative Negative   Yeast Exam Negative Negative   Clue Cell Exam Negative Negative  Urinalysis, Routine w reflex microscopic  Result Value Ref Range   Specific Gravity, UA 1.020 1.005 - 1.030   pH, UA 6.5 5.0 - 7.5   Color, UA Yellow Yellow   Appearance Ur  Clear Clear   Leukocytes,UA Negative Negative   Protein,UA Negative Negative/Trace   Glucose, UA Negative Negative   Ketones, UA  Negative Negative   RBC, UA Negative Negative   Bilirubin, UA Negative Negative   Urobilinogen, Ur 1.0 0.2 - 1.0 mg/dL   Nitrite, UA Negative Negative      Assessment & Plan:   Problem List Items Addressed This Visit       Cardiovascular and Mediastinum   Coronary artery disease, non-occlusive   Relevant Medications   EPINEPHrine 0.3 mg/0.3 mL IJ SOAJ injection   nitroGLYCERIN (NITROSTAT) 0.4 MG SL tablet     Other   Anxiety    Chronic, ongoing.  Continue Klonopin at this time, patient educated on risks of medication and is not interested in reducing -- refills sent in.  Continue Prozac 40 MG which benefits anxiety and fibromyalgia, discussed with her changing to Zoloft or Duloxetine (which would benefit chronic pain issues) if increase anxiety/depression noted.  At this time she denies SI/HI and wishes to maintain current regimen.  UDS yearly need - due 09/14/22, contract up to date.  Return in 3 months.      Relevant Medications   clonazePAM (KLONOPIN) 1 MG tablet (Start on 07/07/2022)   Benzodiazepine dependence, continuous ([place])    Refer to anxiety plan for updates and discussion. UDS due next 09/14/22.      Conversion disorder with mixed symptoms    Refer to anxiety plan of care.      Depression, recurrent ([place]) - Primary    Chronic, ongoing.  Continue current medication regimen and adjust as needed.  Denies SI/HI.  Consider change from Prozac to Cymbalta in future, which would offer benefit with anxiety and depression + fibromyalgia/alpha gal -- she refuses this change today.  Very anxious about changes.        Follow up plan: Return in about 3 months (around 09/09/2022) for HTN/HLD, MOOD, PAIN, ALPHA GAL, GERD.

## 2022-06-09 NOTE — Patient Instructions (Signed)
Alpha-gal Syndrome Alpha-gal syndrome (AGS) is an allergic reaction to a type of sugar commonly called alpha-gal. It is found in the meat and organ meats of mammals, such as cows, pigs, and sheep. It may also be found in products that come from animals, such as gelatin, medicines, medicine capsules, some milk products, vaccines, and cosmetics. AGS causes an allergic reaction that can be immediate or delayed for several hours and can range from mild to severe. A mild reaction may cause nausea, vomiting, or an itchy rash (hives). A severe reaction can cause breathing difficulties or loss of consciousness (anaphylaxis). This can be life-threatening. What are the causes? This allergy is first triggered by a tick bite from a lone star or blackleg tick. These ticks bite animals, such as cows, pigs, or sheep, and pick up the alpha-gal sugar from their blood. If the same tick bites you, it may cause your body's defense system (immune system) to produce antibodies to alpha-gal and cause the allergic reaction. What increases the risk? People who live in areas of the [place] where the lone star tick is common are at highest risk, these areas may include: [place]heastern. [place]. [place] into parts of [place]. People who are hunters and people who work in jobs that involve caring for trees (foresters) have an increased risk of this condition. What are the signs or symptoms? AGS may not cause an allergic reaction every time you eat red meat or come into contact with alpha-gal. If you do have a reaction, symptoms may include: Hives. Severe stomachache. Nausea or vomiting. Swelling of the lips, face, tongue, or throat. Making a high-pitched whistling sound when you breathe, most often when you breathe out (wheezing). Sneezing and runny nose. Headache. Symptoms of anaphylaxis may include: Difficulty breathing. Difficulty swallowing. Dizziness. Fainting. This may happen due to a sudden drop in  blood pressure. You may have AGS if you had anaphylaxis after eating something but do not have any known food allergies. Unlike other food allergies, the reaction does not start soon after the exposure to alpha-gal. There may be a delay of several hours. How is this diagnosed? This condition may be diagnosed based on signs and symptoms of the condition, especially if you have a history of tick bites and a delayed reaction to red meat. You may also have a blood test to check for antibodies to alpha-gal or a skin test to see if there is a reaction to alpha-gal. How is this treated? This condition may be treated by: Avoiding meat and organ meats that may contain alpha-gal. Avoiding medicines or other products that may contain alpha-gal. Using medicines to reduce an allergic reaction. Carrying an epinephrine auto-injector to use in case of a severe AGS reaction. AGS should be treated by an allergist or a health care provider who has experience with AGS. Follow these instructions at home:  Medicines Take over-the-counter and prescription medicines only as told by your health care provider. Follow instructions from your health care provider about when and how to use an epinephrine auto-injector. General instructions Avoid red meat and organ meat, and check food labels for meat-based ingredients in packaged foods such as soup, gravy, and flavoring. Work with your allergist to find what other foods or products you may need to avoid, some people may benefit from avoiding dairy. Keep all follow-up visits. This is important. How is this prevented? Take steps to prevent tick bites as frequent tick bites may increase the risk of an AGS reaction. These steps   include: Avoiding woods and fields with high grass. Wearing clothing protected with the anti-tick chemical permethrin when outdoors in these areas or using a U.S. Environmental Protection Agency-approved insect repellent. Checking your clothing for  ticks before you come back indoors. Checking your body for ticks when you take a shower. Checking your pets for ticks. Where to find more information Centers for Disease Control and Prevention: www.cdc.gov National Institute of Allergy and Infectious Diseases: www.niaid.nih.gov Contact a health care provider if: You have any signs or symptoms of AGS food allergy. You have a tick bite that causes a skin reaction. Get help right away if: You have a severe AGS reaction. You have trouble swallowing or breathing. These symptoms may represent a serious problem that is an emergency. Do not wait to see if the symptoms will go away. Get medical help right away. Call your local emergency services (911 in the [place] Do not drive yourself to the hospital. Summary AGS is a food allergy caused by a tick bite. Red meats, organ meats, and other products or medicines may trigger the alpha-gal reaction. AGS reactions can be mild or severe. If you have AGS, you should not eat red meat, organ meat, or products that may contain alpha-gal. Avoiding tick bites prevents AGS and more serious AGS reactions. This information is not intended to replace advice given to you by your health care provider. Make sure you discuss any questions you have with your health care provider. Document Revised: 11/23/2020 Document Reviewed: 11/23/2020 Elsevier Patient Education  2023 Elsevier Inc.  

## 2022-06-09 NOTE — Assessment & Plan Note (Signed)
Refer to anxiety plan of care. 

## 2022-06-19 ENCOUNTER — Telehealth: Payer: Self-pay | Admitting: Nurse Practitioner

## 2022-06-19 MED ORDER — CLONAZEPAM 1 MG PO TABS: 1.0000 mg | ORAL_TABLET | Freq: Two times a day (BID) | ORAL | 2 refills | Status: DC

## 2022-06-19 NOTE — Telephone Encounter (Signed)
Copied from [facility] [id number]. Topic: General - Other >> Jun 19, 2022  2:43 PM Ja-Kwan M wrote: Reason for CRM: Pt requests that the Rx for clonazePAM (KLONOPIN) 1 MG tablet be corrected to reflect 60 tablets instead of 30 tablets because she takes 2 tablets daily. Pt requests that a new Rx for 60 tablets be submitted to her pharmacy

## 2022-06-19 NOTE — Addendum Note (Signed)
Addended by: Marnee Guarneri T on: 06/19/2022 05:14 PM   Modules accepted: Orders

## 2022-07-17 ENCOUNTER — Encounter: Payer: Self-pay | Admitting: Radiology

## 2022-07-17 ENCOUNTER — Other Ambulatory Visit: Payer: Self-pay

## 2022-07-17 ENCOUNTER — Emergency Department
Admission: EM | Admit: 2022-07-17 | Discharge: 2022-07-17 | Disposition: A | Discharge status: Home / Self Care | Payer: Medicare Other | Attending: Emergency Medicine | Admitting: Emergency Medicine

## 2022-07-17 ENCOUNTER — Emergency Department: Payer: Medicare Other

## 2022-07-17 ENCOUNTER — Ambulatory Visit: Payer: Self-pay

## 2022-07-17 DIAGNOSIS — R109 Unspecified abdominal pain: Secondary | ICD-10-CM | POA: Insufficient documentation

## 2022-07-17 DIAGNOSIS — R001 Bradycardia, unspecified: Secondary | ICD-10-CM | POA: Diagnosis not present

## 2022-07-17 DIAGNOSIS — R110 Nausea: Secondary | ICD-10-CM | POA: Insufficient documentation

## 2022-07-17 DIAGNOSIS — I700 Atherosclerosis of aorta: Secondary | ICD-10-CM | POA: Diagnosis not present

## 2022-07-17 DIAGNOSIS — I728 Aneurysm of other specified arteries: Secondary | ICD-10-CM | POA: Diagnosis not present

## 2022-07-17 DIAGNOSIS — R102 Pelvic and perineal pain: Secondary | ICD-10-CM | POA: Diagnosis not present

## 2022-07-17 DIAGNOSIS — D3501 Benign neoplasm of right adrenal gland: Secondary | ICD-10-CM | POA: Diagnosis not present

## 2022-07-17 DIAGNOSIS — J449 Chronic obstructive pulmonary disease, unspecified: Secondary | ICD-10-CM | POA: Insufficient documentation

## 2022-07-17 DIAGNOSIS — I2510 Atherosclerotic heart disease of native coronary artery without angina pectoris: Secondary | ICD-10-CM | POA: Insufficient documentation

## 2022-07-17 DIAGNOSIS — R1084 Generalized abdominal pain: Secondary | ICD-10-CM | POA: Diagnosis not present

## 2022-07-17 LAB — COMPREHENSIVE METABOLIC PANEL
ALT: 20 U/L (ref 0–44)
AST: 26 U/L (ref 15–41)
Albumin: 4 g/dL (ref 3.5–5.0)
Alkaline Phosphatase: 81 U/L (ref 38–126)
Anion gap: 6 (ref 5–15)
BUN: 10 mg/dL (ref 8–23)
CO2: 29 mmol/L (ref 22–32)
Calcium: 9 mg/dL (ref 8.9–10.3)
Chloride: 106 mmol/L (ref 98–111)
Creatinine, Ser: 0.77 mg/dL (ref 0.44–1.00)
GFR, Estimated: >60 mL/min (ref >60)
Glucose, Bld: 92 mg/dL (ref 70–99)
Potassium: 4.3 mmol/L (ref 3.5–5.1)
Sodium: 141 mmol/L (ref 135–145)
Total Bilirubin: 0.7 mg/dL (ref 0.3–1.2)
Total Protein: 7.2 g/dL (ref 6.5–8.1)

## 2022-07-17 LAB — URINALYSIS, ROUTINE W REFLEX MICROSCOPIC
Bilirubin Urine: NEGATIVE
Glucose, UA: NEGATIVE mg/dL
Hgb urine dipstick: NEGATIVE
Ketones, ur: NEGATIVE mg/dL
Leukocytes,Ua: NEGATIVE
Nitrite: NEGATIVE
Protein, ur: NEGATIVE mg/dL
Specific Gravity, Urine: 1.013 (ref 1.005–1.030)
pH: 8 (ref 5.0–8.0)

## 2022-07-17 LAB — CBC
HCT: 39.2 % (ref 36.0–46.0)
Hemoglobin: 12.9 g/dL (ref 12.0–15.0)
MCH: 29.3 pg (ref 26.0–34.0)
MCHC: 32.9 g/dL (ref 30.0–36.0)
MCV: 89.1 fL (ref 80.0–100.0)
Platelets: 236 10*3/uL (ref 150–400)
RBC: 4.4 MIL/uL (ref 3.87–5.11)
RDW: 12.9 % (ref 11.5–15.5)
WBC: 4.8 10*3/uL (ref 4.0–10.5)
nRBC: 0 % (ref 0.0–0.2)

## 2022-07-17 LAB — LIPASE, BLOOD: Lipase: 42 U/L (ref 11–51)

## 2022-07-17 MED ORDER — MORPHINE SULFATE (PF) 4 MG/ML IV SOLN
4.0000 mg | Freq: Once | INTRAVENOUS | Status: AC
  Administered 2022-07-17: 4 mg via INTRAVENOUS
  Filled 2022-07-17: qty 1

## 2022-07-17 MED ORDER — HYDROCODONE-ACETAMINOPHEN 5-325 MG PO TABS: 1.0000 | ORAL_TABLET | Freq: Four times a day (QID) | ORAL | 0 refills | Status: DC | PRN

## 2022-07-17 MED ORDER — ONDANSETRON HCL 4 MG/2ML IJ SOLN
4.0000 mg | Freq: Once | INTRAMUSCULAR | Status: AC
  Administered 2022-07-17: 4 mg via INTRAVENOUS
  Filled 2022-07-17: qty 2

## 2022-07-17 NOTE — ED Triage Notes (Signed)
Pt with abd pain for the past three days. States it is distended with shooting pain on her right side into her pelvic area. Pt endorses throat pain and pain in her chest but states that has been going on for the past 6 months and she states she knows it is her acid reflux.

## 2022-07-17 NOTE — ED Provider Notes (Signed)
[facility] Provider Note    Event Date/Time   First MD Initiated Contact with Patient 07/17/22 1346     (approximate)   History   Right flank pain  HPI  Lindsey Bautista is a 60 y.o. female with a history of COPD, CAD, fibromyalgia who presents with complaints of right flank pain x 2 to 3 days.  She also reports that she feels bloated.  Positive nausea no vomiting.  Normal bowel movements.  No history of kidney stones.  No dysuria     Physical Exam   Triage Vital Signs: ED Triage Vitals  Enc Vitals Group     BP 07/17/22 1332 (!) 141/76     Pulse Rate 07/17/22 1332 63     Resp 07/17/22 1332 17     Temp 07/17/22 1332 98.4 F (36.9 C)     Temp Source 07/17/22 1332 Oral     SpO2 07/17/22 1332 96 %     Weight 07/17/22 1333 66.7 kg (147 lb)     Height 07/17/22 1333 1.549 m ('5\' 1"'$ )     Head Circumference --      Peak Flow --      Pain Score --      Pain Loc --      Pain Edu? --      Excl. in [place]? --     Most recent vital signs: Vitals:   07/17/22 1441 07/17/22 1549  BP: 125/68 138/74  Pulse: (!) 51 (!) 57  Resp: 16 14  Temp:  98.5 F (36.9 C)  SpO2: 99% 97%     General: Awake, no distress.  CV:  Good peripheral perfusion.  Resp:  Normal effort.  Abd:  Minimal distention, no significant tenderness to palpation.  No CVA tenderness. Other:     ED Results / Procedures / Treatments   Labs (all labs ordered are listed, but only abnormal results are displayed) Labs Reviewed  URINALYSIS, ROUTINE W REFLEX MICROSCOPIC - Abnormal; Notable for the following components:      Result Value   Color, Urine YELLOW (*)    APPearance CLEAR (*)    All other components within normal limits  LIPASE, BLOOD  COMPREHENSIVE METABOLIC PANEL  CBC     EKG  ED ECG REPORT I, Lavonia Drafts, the attending physician, personally viewed and interpreted this ECG.  Date: 07/17/2022  Rhythm: normal sinus rhythm QRS Axis: normal Intervals: normal ST/T Wave  abnormalities: normal Narrative Interpretation: no evidence of acute ischemia    RADIOLOGY CT renal stone study pending    PROCEDURES:  Critical Care performed:   Procedures   MEDICATIONS ORDERED IN ED: Medications  morphine (PF) 4 MG/ML injection 4 mg (4 mg Intravenous Given 07/17/22 1449)  ondansetron (ZOFRAN) injection 4 mg (4 mg Intravenous Given 07/17/22 1435)     IMPRESSION / MDM / ASSESSMENT AND PLAN / ED COURSE  I reviewed the triage vital signs and the nursing notes. Patient's presentation is most consistent with acute presentation with potential threat to life or bodily function.  Patient presents with abdominal pain as detailed above.  Differential includes kidney stone, UTI/pyelonephritis, SBO  Will obtain labs, give IV morphine, IV Zofran, obtain CT abdomen pelvis.  Patient has IV contrast allergy, will get Noncon scan  Lab work reviewed, CMP is normal, CBC is normal, urinalysis unremarkable  Noncon CT scan is without acute findings  Patient is feeling improved, still having some discomfort, she is wondering if this could be  related to her fibromyalgia.  Offered admission however she would prefer discharge with analgesics and she will return if any worsening      FINAL CLINICAL IMPRESSION(S) / ED DIAGNOSES   Final diagnoses:  Flank pain     Rx / DC Orders   ED Discharge Orders          Ordered    HYDROcodone-acetaminophen (NORCO/VICODIN) 5-325 MG tablet  Every 6 hours PRN        07/17/22 1613             Note:  This document was prepared using Dragon voice recognition software and may include unintentional dictation errors.   Lavonia Drafts, MD 07/17/22 [phone]

## 2022-07-17 NOTE — ED Notes (Signed)
Dr. Corky Downs informed of patient's vital signs prior to morphine administration.

## 2022-07-17 NOTE — ED Notes (Signed)
First Nurse Note: Patient to ED via [facility] for right sided abd/ pelic pain for the past 5 days.  128/80 99% 60 HR

## 2022-07-17 NOTE — Telephone Encounter (Signed)
Due to period of 5 days and current pain limit agree with ED for further evaluation.

## 2022-07-17 NOTE — Telephone Encounter (Signed)
  Chief Complaint: abdominal swelling Symptoms: abdominal distention and pain 10/10, nausea  Frequency: 5 days  Pertinent Negatives: Patient denies vomiting  Disposition: '[x]'$ ED /'[]'$ Urgent Care (no appt availability in office) / '[]'$ Appointment(In office/virtual)/ '[]'$  [place] [place] [facility]/ '[]'$ Home Care/ '[]'$ Refused Recommended Disposition /'[]'$ [facility]/ '[]'$  Follow-up with PCP Additional Notes: pt states that swelling and pain that has gotten worse over 5 day period. Pt states pain radiates from stomach on R side to groin and down R leg.Advised no appts today and recommended ED for eval. Pt verbalized understanding.   Reason for Disposition  [1] MODERATE-SEVERE SWELLING of abdomen (e.g., looks very distended or swollen) AND [2] NEW-onset or much worse  Answer Assessment - Initial Assessment Questions 1. SYMPTOM: "What's the main symptom you're concerned about?" (e.g., abdomen bloating, swelling)     Swelling on R side  2. ONSET: "When did sx  start?"     5 days  3. SEVERITY: "How bad is the bloating or swelling?"    - BLOATING: Feels gassy or bloated. No visible swelling.     - MILD SWELLING: Feels gassy or bloated. Abdomen looks mildly distended or swollen.    - MODERATE - SEVERE SWELLING: Abdomen looks very distended or swollen.      Moderate  4. ABDOMEN PAIN:  "Is there any abdomen pain?" If Yes, ask: "How bad is the pain?"  (e.g., Scale 1-10; mild, moderate, or severe)   - NONE (0): No pain.   - MILD (1-3): Doesn't interfere with normal activities, abdomen soft and not tender to touch.    - MODERATE (4-7): Interferes with normal activities or awakens from sleep, abdomen tender to touch.    - SEVERE (8-10): Excruciating pain, doubled over, unable to do any normal activities.       10 8. OTHER SYMPTOMS: "Do you have any other symptoms?" (e.g., belching, blood in stool, breathing difficulty, constipation, diarrhea, fever, passing gas, vomiting, weight loss, white of eyes have turned  yellow)     Nausea  Protocols used: Abdomen Bloating and Swelling-A-AH

## 2022-07-17 NOTE — ED Provider Triage Note (Signed)
Emergency Medicine Provider Triage Evaluation Note  Lindsey Bautista , a 61 y.o. female  was evaluated in triage.  Pt complains of abdominal pain and bloating that has been going on for the past few weeks, but worse over the past 3 days. Also having pain in her chest for the past 6 months due to "acid reflux".  Physical Exam  There were no vitals taken for this visit. Gen:   Awake, no distress   Resp:  Normal effort  MSK:   Moves extremities without difficulty  Other:    Medical Decision Making  Medically screening exam initiated at 1:32 PM.  Appropriate orders placed.  Lindsey Bautista was informed that the remainder of the evaluation will be completed by another provider, this initial triage assessment does not replace that evaluation, and the importance of remaining in the ED until their evaluation is complete.     Victorino Dike, FNP 07/17/22 1333

## 2022-07-18 ENCOUNTER — Ambulatory Visit: Payer: Medicare Other | Admitting: Nurse Practitioner

## 2022-07-26 ENCOUNTER — Telehealth: Payer: Self-pay

## 2022-07-26 NOTE — Telephone Encounter (Signed)
        Patient  visited [place] on 11/27    Telephone encounter attempt :  1ST  A HIPAA compliant voice message was left requesting a return call.  Instructed patient to call back .    [place], Care Management  [phone] [address], [place], [place] [address] Phone: [phone] Email: [email or web address]

## 2022-07-27 ENCOUNTER — Telehealth: Payer: Self-pay

## 2022-07-27 NOTE — Telephone Encounter (Signed)
      Patient  visit on 11/27  at [place]   Have you been able to follow up with your primary care physician? Yes   The patient was or was not able to obtain any needed medicine or equipment. Yes   Are there diet recommendations that you are having difficulty following? Na   Patient expresses understanding of discharge instructions and education provided has no other needs at this time.  Yes      [place], [facility], Care Management  [phone] [address], [place], [place] [address] Phone: [phone] Email: [email or web address]

## 2022-07-28 ENCOUNTER — Ambulatory Visit (INDEPENDENT_AMBULATORY_CARE_PROVIDER_SITE_OTHER): Payer: Medicare Other | Admitting: Nurse Practitioner

## 2022-07-28 ENCOUNTER — Encounter: Payer: Self-pay | Admitting: Nurse Practitioner

## 2022-07-28 VITALS — BP 136/78 | HR 59 | Temp 98.0°F | Ht 60.98 in | Wt 147.6 lb

## 2022-07-28 DIAGNOSIS — J0110 Acute frontal sinusitis, unspecified: Secondary | ICD-10-CM | POA: Diagnosis not present

## 2022-07-28 DIAGNOSIS — J321 Chronic frontal sinusitis: Secondary | ICD-10-CM | POA: Insufficient documentation

## 2022-07-28 MED ORDER — AZITHROMYCIN 250 MG PO TABS: ORAL_TABLET | ORAL | 0 refills | Status: AC

## 2022-07-28 MED ORDER — PREDNISONE 20 MG PO TABS: 40.0000 mg | ORAL_TABLET | Freq: Every day | ORAL | 0 refills | Status: AC

## 2022-07-28 NOTE — Assessment & Plan Note (Signed)
Acute for 2 weeks with no improvement.  Start Azithromycin and Prednisone for infection.  Recommend: - Increased rest - Increasing Fluids - Acetaminophen as needed for fever/pain.  - Salt water gargling, chloraseptic spray and throat lozenges - Mucinex.  - Saline sinus flushes or a neti pot.  - Humidifying the air.  Return if worsening or ongoing.

## 2022-07-28 NOTE — Progress Notes (Signed)
Acute Office Visit  Subjective:     Patient ID: Lindsey Bautista, female    DOB: [birth year], 61 y.o.   MRN: [id number]  Chief Complaint  Patient presents with   Headache    Nasal congestion, cough, ear pain, started for past work.    Been sick for 2 weeks.  Started out with flu-like symptoms = chills, fever, myalgias + headache.  Was not around anyone who was sick.  Does not get Covid and flu vaccines due to side effects.  Sinusitis This is a new problem. The current episode started 1 to 4 weeks ago. The problem is unchanged. There has been no fever. Her pain is at a severity of 7/10. The pain is moderate. Associated symptoms include congestion, coughing (a little bit), ear pain, headaches, a hoarse voice, sinus pressure, a sore throat and swollen glands. Pertinent negatives include no chills, diaphoresis, neck pain, shortness of breath or sneezing. Past treatments include sitting up and acetaminophen. The treatment provided mild relief.   Patient is in today for sinus congestion.  Review of Systems  Constitutional:  Positive for malaise/fatigue. Negative for chills, diaphoresis and fever.  HENT:  Positive for congestion, ear pain, hoarse voice, sinus pressure, sinus pain and sore throat. Negative for ear discharge, nosebleeds, sneezing and tinnitus.   Respiratory:  Positive for cough (a little bit). Negative for sputum production, shortness of breath and wheezing.   Cardiovascular: Negative.   Gastrointestinal: Negative.   Musculoskeletal:  Negative for neck pain.  Neurological:  Positive for headaches.  Psychiatric/Behavioral: Negative.        Objective:    BP 136/78   Pulse (!) 59   Temp 98 F (36.7 C) (Oral)   Ht 5' 0.98" (1.549 m)   Wt 147 lb 9.6 oz (67 kg)   SpO2 98%   BMI 27.90 kg/m  BP Readings from Last 3 Encounters:  07/28/22 136/78  07/17/22 130/70  06/09/22 137/71   Wt Readings from Last 3 Encounters:  07/28/22 147 lb 9.6 oz (67 kg)  07/17/22 147 lb (66.7  kg)  06/09/22 147 lb 1.6 oz (66.7 kg)   Physical Exam Vitals and nursing note reviewed.  Constitutional:      General: She is awake. She is not in acute distress.    Appearance: She is well-developed, well-groomed and overweight. She is not ill-appearing.  HENT:     Head: Normocephalic.     Right Ear: Hearing, ear canal and external ear normal. A middle ear effusion is present. Tympanic membrane is not injected or perforated.     Left Ear: Hearing, ear canal and external ear normal. A middle ear effusion is present. Tympanic membrane is injected. Tympanic membrane is not perforated.     Nose: Rhinorrhea present. Rhinorrhea is clear.     Right Sinus: Frontal sinus tenderness present. No maxillary sinus tenderness.     Left Sinus: Frontal sinus tenderness present. No maxillary sinus tenderness.     Mouth/Throat:     Mouth: Mucous membranes are moist.     Pharynx: Posterior oropharyngeal erythema (cobblestone pattern) present. No pharyngeal swelling or oropharyngeal exudate.  Eyes:     General: Lids are normal.        Right eye: No discharge.        Left eye: No discharge.     Conjunctiva/sclera: Conjunctivae normal.     Pupils: Pupils are equal, round, and reactive to light.  Neck:     Vascular: No carotid bruit.  Cardiovascular:  Rate and Rhythm: Normal rate and regular rhythm.     Heart sounds: Normal heart sounds. No murmur heard.    No gallop.  Pulmonary:     Effort: Pulmonary effort is normal. No accessory muscle usage or respiratory distress.     Breath sounds: Normal breath sounds.  Abdominal:     General: Bowel sounds are normal.     Palpations: Abdomen is soft.     Tenderness: There is no right CVA tenderness, left CVA tenderness, guarding or rebound.     Hernia: No hernia is present.  Musculoskeletal:     Cervical back: Normal range of motion and neck supple.     Right lower leg: No edema.     Left lower leg: No edema.  Lymphadenopathy:     Head:     Right side  of head: Submandibular adenopathy present. No submental, tonsillar, preauricular or posterior auricular adenopathy.     Left side of head: Submandibular adenopathy present. No submental, tonsillar, preauricular or posterior auricular adenopathy.  Skin:    General: Skin is warm and dry.  Neurological:     Mental Status: She is alert and oriented to person, place, and time.  Psychiatric:        Attention and Perception: Attention normal.        Mood and Affect: Mood normal.        Speech: Speech normal.        Behavior: Behavior normal. Behavior is cooperative.        Thought Content: Thought content normal.    No results found for any visits on 07/28/22.     Assessment & Plan:   Problem List Items Addressed This Visit       Respiratory   Frontal sinusitis - Primary    Acute for 2 weeks with no improvement.  Start Azithromycin and Prednisone for infection.  Recommend: - Increased rest - Increasing Fluids - Acetaminophen as needed for fever/pain.  - Salt water gargling, chloraseptic spray and throat lozenges - Mucinex.  - Saline sinus flushes or a neti pot.  - Humidifying the air.  Return if worsening or ongoing.      Relevant Medications   azithromycin (ZITHROMAX) 250 MG tablet   predniSONE (DELTASONE) 20 MG tablet    Meds ordered this encounter  Medications   azithromycin (ZITHROMAX) 250 MG tablet    Sig: Take 2 tablets on day 1, then 1 tablet daily on days 2 through 5    Dispense:  6 tablet    Refill:  0   predniSONE (DELTASONE) 20 MG tablet    Sig: Take 2 tablets (40 mg total) by mouth daily with breakfast for 5 days.    Dispense:  10 tablet    Refill:  0    Return for as scheduled in January.  Venita Lick, NP

## 2022-07-28 NOTE — Patient Instructions (Signed)

## 2022-08-23 ENCOUNTER — Telehealth: Payer: Self-pay | Admitting: Nurse Practitioner

## 2022-08-23 ENCOUNTER — Encounter: Payer: Self-pay | Admitting: Nurse Practitioner

## 2022-08-23 DIAGNOSIS — Z1231 Encounter for screening mammogram for malignant neoplasm of breast: Secondary | ICD-10-CM

## 2022-08-23 NOTE — Telephone Encounter (Signed)
Copied from [place] [id number]. Topic: General - Other >> Aug 23, 2022  1:59 PM Ludger Nutting wrote: Patient is requesting a order be put in for her yearly screening mammogram. Patient is also requesting a diagnostic mammogram on her right breast due to tenderness. Please follow up with patient.

## 2022-08-24 NOTE — Telephone Encounter (Signed)
Pt scheduled 1/10

## 2022-08-24 NOTE — Telephone Encounter (Signed)
Called patient to ask her to schedule an appointment for breast tenderness

## 2022-08-28 NOTE — Patient Instructions (Incomplete)
Please call to schedule your mammogram and/or bone density: [facility] [place]: [address], [place], [place] [address] Phone: [phone]  [place] [facility] [address] [place],  [place]  [address] Phone: [phone]    Managing Anxiety, Adult After being diagnosed with anxiety, you may be relieved to know why you have felt or behaved a certain way. You may also feel overwhelmed about the treatment ahead and what it will mean for your life. With care and support, you can manage this condition. How to manage lifestyle changes Managing stress and anxiety  Stress is your body's reaction to life changes and events, both good and bad. Most stress will last just a few hours, but stress can be ongoing and can lead to more than just stress. Although stress can play a major role in anxiety, it is not the same as anxiety. Stress is usually caused by something external, such as a deadline, test, or competition. Stress normally passes after the triggering event has ended.  Anxiety is caused by something internal, such as imagining a terrible outcome or worrying that something will go wrong that will devastate you. Anxiety often does not go away even after the triggering event is over, and it can become long-term (chronic) worry. It is important to understand the differences between stress and anxiety and to manage your stress effectively so that it does not lead to an anxious response. Talk with your health care provider or a counselor to learn more about reducing anxiety and stress. He or she may suggest tension reduction techniques, such as: Music therapy. Spend time creating or listening to music that you enjoy and that inspires you. Mindfulness-based meditation. Practice being aware of your normal breaths while not trying to control your breathing. It can be done while sitting or walking. Centering prayer. This involves  focusing on a word, phrase, or sacred image that means something to you and brings you peace. Deep breathing. To do this, expand your stomach and inhale slowly through your nose. Hold your breath for 3-5 seconds. Then exhale slowly, letting your stomach muscles relax. Self-talk. Learn to notice and identify thought patterns that lead to anxiety reactions and change those patterns to thoughts that feel peaceful. Muscle relaxation. Taking time to tense muscles and then relax them. Choose a tension reduction technique that fits your lifestyle and personality. These techniques take time and practice. Set aside 5-15 minutes a day to do them. Therapists can offer counseling and training in these techniques. The training to help with anxiety may be covered by some insurance plans. Other things you can do to manage stress and anxiety include: Keeping a stress diary. This can help you learn what triggers your reaction and then learn ways to manage your response. Thinking about how you react to certain situations. You may not be able to control everything, but you can control your response. Making time for activities that help you relax and not feeling guilty about spending your time in this way. Doing visual imagery. This involves imagining or creating mental pictures to help you relax. Practicing yoga. Through yoga poses, you can lower tension and promote relaxation.  Medicines Medicines can help ease symptoms. Medicines for anxiety include: Antidepressant medicines. These are usually prescribed for long-term daily control. Anti-anxiety medicines. These may be added in severe cases, especially when panic attacks occur. Medicines will be prescribed by a health care provider. When used together, medicines,  psychotherapy, and tension reduction techniques may be the most effective treatment. Relationships Relationships can play a big part in helping you recover. Try to spend more time connecting with trusted  friends and family members. Consider going to couples counseling if you have a partner, taking family education classes, or going to family therapy. Therapy can help you and others better understand your condition. How to recognize changes in your anxiety Everyone responds differently to treatment for anxiety. Recovery from anxiety happens when symptoms decrease and stop interfering with your daily activities at home or work. This may mean that you will start to: Have better concentration and focus. Worry will interfere less in your daily thinking. Sleep better. Be less irritable. Have more energy. Have improved memory. It is also important to recognize when your condition is getting worse. Contact your health care provider if your symptoms interfere with home or work and you feel like your condition is not improving. Follow these instructions at home: Activity Exercise. Adults should do the following: Exercise for at least 150 minutes each week. The exercise should increase your heart rate and make you sweat (moderate-intensity exercise). Strengthening exercises at least twice a week. Get the right amount and quality of sleep. Most adults need 7-9 hours of sleep each night. Lifestyle  Eat a healthy diet that includes plenty of vegetables, fruits, whole grains, low-fat dairy products, and lean protein. Do not eat a lot of foods that are high in fats, added sugars, or salt (sodium). Make choices that simplify your life. Do not use any products that contain nicotine or tobacco. These products include cigarettes, chewing tobacco, and vaping devices, such as e-cigarettes. If you need help quitting, ask your health care provider. Avoid caffeine, alcohol, and certain over-the-counter cold medicines. These may make you feel worse. Ask your pharmacist which medicines to avoid. General instructions Take over-the-counter and prescription medicines only as told by your health care provider. Keep all  follow-up visits. This is important. Where to find support You can get help and support from these sources: Self-help groups. Online and [employer]. A trusted spiritual leader. Couples counseling. Family education classes. Family therapy. Where to find more information You may find that joining a support group helps you deal with your anxiety. The following sources can help you locate counselors or support groups near you: [place]: www.mentalhealth[place].net Anxiety and Depression Association of [place] (ADAA): https://www.clark.net/ National Alliance on Mental Illness (NAMI): www.nami.org Contact a health care provider if: You have a hard time staying focused or finishing daily tasks. You spend many hours a day feeling worried about everyday life. You become exhausted by worry. You start to have headaches or frequently feel tense. You develop chronic nausea or diarrhea. Get help right away if: You have a racing heart and shortness of breath. You have thoughts of hurting yourself or others. If you ever feel like you may hurt yourself or others, or have thoughts about taking your own life, get help right away. Go to your nearest emergency department or: Call your local emergency services (911 in the [place] Call a suicide crisis helpline, such as the [place] at 640-047-2953 or 988 in the [place]. This is open 24 hours a day in the [place] Text the Crisis Text Line at 762-211-4736 (in the [place].). Summary Taking steps to learn and use tension reduction techniques can help calm you and help prevent triggering an anxiety reaction. When used together, medicines, psychotherapy, and tension reduction techniques may be the  most effective treatment. Family, friends, and partners can play a big part in supporting you. This information is not intended to replace advice given to you by your health care provider. Make sure you discuss any questions you have with  your health care provider. Document Revised: 03/02/2021 Document Reviewed: 11/28/2020 Elsevier Patient Education  [place].

## 2022-08-30 ENCOUNTER — Telehealth: Payer: Self-pay | Admitting: Nurse Practitioner

## 2022-08-30 ENCOUNTER — Encounter: Payer: Self-pay | Admitting: Nurse Practitioner

## 2022-08-30 ENCOUNTER — Ambulatory Visit (INDEPENDENT_AMBULATORY_CARE_PROVIDER_SITE_OTHER): Payer: Medicare Other | Admitting: Nurse Practitioner

## 2022-08-30 VITALS — BP 139/78 | HR 70 | Temp 98.0°F | Ht 60.98 in | Wt 148.4 lb

## 2022-08-30 DIAGNOSIS — Z8673 Personal history of transient ischemic attack (TIA), and cerebral infarction without residual deficits: Secondary | ICD-10-CM

## 2022-08-30 DIAGNOSIS — F1320 Sedative, hypnotic or anxiolytic dependence, uncomplicated: Secondary | ICD-10-CM | POA: Diagnosis not present

## 2022-08-30 DIAGNOSIS — I700 Atherosclerosis of aorta: Secondary | ICD-10-CM

## 2022-08-30 DIAGNOSIS — M797 Fibromyalgia: Secondary | ICD-10-CM | POA: Diagnosis not present

## 2022-08-30 DIAGNOSIS — F339 Major depressive disorder, recurrent, unspecified: Secondary | ICD-10-CM

## 2022-08-30 DIAGNOSIS — F447 Conversion disorder with mixed symptom presentation: Secondary | ICD-10-CM

## 2022-08-30 DIAGNOSIS — D518 Other vitamin B12 deficiency anemias: Secondary | ICD-10-CM

## 2022-08-30 DIAGNOSIS — Z91018 Allergy to other foods: Secondary | ICD-10-CM

## 2022-08-30 DIAGNOSIS — Z79899 Other long term (current) drug therapy: Secondary | ICD-10-CM

## 2022-08-30 DIAGNOSIS — W19XXXA Unspecified fall, initial encounter: Secondary | ICD-10-CM

## 2022-08-30 DIAGNOSIS — N644 Mastodynia: Secondary | ICD-10-CM

## 2022-08-30 DIAGNOSIS — E782 Mixed hyperlipidemia: Secondary | ICD-10-CM

## 2022-08-30 DIAGNOSIS — K2270 Barrett's esophagus without dysplasia: Secondary | ICD-10-CM | POA: Diagnosis not present

## 2022-08-30 DIAGNOSIS — I2510 Atherosclerotic heart disease of native coronary artery without angina pectoris: Secondary | ICD-10-CM | POA: Diagnosis not present

## 2022-08-30 DIAGNOSIS — J432 Centrilobular emphysema: Secondary | ICD-10-CM

## 2022-08-30 DIAGNOSIS — E559 Vitamin D deficiency, unspecified: Secondary | ICD-10-CM

## 2022-08-30 DIAGNOSIS — F419 Anxiety disorder, unspecified: Secondary | ICD-10-CM | POA: Diagnosis not present

## 2022-08-30 DIAGNOSIS — F17210 Nicotine dependence, cigarettes, uncomplicated: Secondary | ICD-10-CM

## 2022-08-30 MED ORDER — CLONAZEPAM 1 MG PO TABS: 1.0000 mg | ORAL_TABLET | Freq: Two times a day (BID) | ORAL | 2 refills | Status: DC

## 2022-08-30 NOTE — Assessment & Plan Note (Signed)
Chronic, ongoing.  Recommended she try stopping Prozac and change to Duloxetine which would benefit mood and pain, she refuses this - fearful of change.  Recommend she continue Gabapentin.

## 2022-08-30 NOTE — Telephone Encounter (Signed)
Pts refills for clonazePAM (KLONOPIN) 1 MG tablet were sent to the pharmacy today but show a start date of 1.26.24/ pt will be out of medication on 1.16.24 and wants to make sure she will be able to fill medication on the 16th / please advise

## 2022-08-30 NOTE — Assessment & Plan Note (Signed)
Chronic, ongoing.  Continue current medication regimen and adjust as needed.  Denies SI/HI.  Consider change from Prozac to Cymbalta in future, which would offer benefit with anxiety and depression + fibromyalgia/alpha gal -- she refuses this change today.  Very anxious about changes.  Referral to psychiatry and therapy, which would be beneficial as worsening mood present.

## 2022-08-30 NOTE — Assessment & Plan Note (Signed)
Chronic, stable.  Continue current medication regimen and collaboration with cardiology.

## 2022-08-30 NOTE — Assessment & Plan Note (Signed)
11 years ago -- she has memory concerns, however increased anxiety recently.  Neuro exam and 6CIT reassuring today.  Will get into psychiatry for further evaluation mood and if ongoing issues consider imaging.

## 2022-08-30 NOTE — Assessment & Plan Note (Signed)
Chronic, ongoing, continue daily supplement and adjust as needed.  Check level today.

## 2022-08-30 NOTE — Assessment & Plan Note (Signed)
Chronic, ongoing.  No current inhaler regimen.  Denies any symptoms.  Continue yearly lung screening and plan on spirometry next visit to assess function.  Recommend complete cessation of smoking.

## 2022-08-30 NOTE — Progress Notes (Signed)
BP 139/78   Pulse 70   Temp 98 F (36.7 C) (Oral)   Ht 5' 0.98" (1.549 m)   Wt 148 lb 6.4 oz (67.3 kg)   SpO2 98%   BMI 28.05 kg/m    Subjective:    Patient ID: Lindsey Bautista, female    DOB: 1960-12-04, 62 y.o.   MRN: [id number]  HPI: Lindsey Bautista is a 62 y.o. female  Chief Complaint  Patient presents with   Fall   Hypertension   Hyperlipidemia   Anxiety   Pain   Alpha Gal   Mood   Gastroesophageal Reflux   HYPERLIPIDEMIA Continues on Atorvastatin, ASA, and Zetia. History of CVA, 11 years ago.   Hyperlipidemia status: good compliance Satisfied with current treatment?  yes Side effects:  no Medication compliance: good compliance Supplements: none Aspirin:  yes The 10-year ASCVD risk score (Arnett DK, et al., 2019) is: 6.9%   Values used to calculate the score:     Age: 45 years     Sex: Female     Is Non-Hispanic African American: No     Diabetic: No     Tobacco smoker: Yes     Systolic Blood Pressure: 709 mmHg     Is BP treated: No     HDL Cholesterol: 58 mg/dL     Total Cholesterol: 150 mg/dL Chest pain:  no Coronary artery disease:  no Family history CAD:  yes Family history early CAD:  no   COPD Centrilobular emphysema and aortic atherosclerosis -- last CT scan 10/11/21. No current inhalers.     Currently smoking <1 PPD, has smoked for >40 years.  Not interested in quitting.   COPD status: stable Satisfied with current treatment?: yes Oxygen use: no Dyspnea frequency:  Cough frequency:  Rescue inhaler frequency:   Limitation of activity: no Productive cough:  Last Spirometry:  Pneumovax:  refuses Influenza: Up to Date   FIBROMYALGIA, ALPHA GAL, B12 DEFICIENCY Ongoing joint pain.  Her B12 level was noted to be on low side last visit, is taking 1000 MCG a day.  Saw rheumatology last 04/04/22 -- they placed referral to allergist, but she has not seen them.  She reports they did not call her and does not want to pursue this a this time.  Has  tender spot to right rib/breast area and reports needing diagnostic mammogram per [place].  Continues with GI issues, discussed Alpha Gal with her -- she reports not eating much.  Returns to see GI in March. Duration: months Pain: yes Symmetric: yes -- R>L = 9/10 Quality: dull, aching, and throbbing Frequency: constant Context:  worse Decreased function/range of motion: yes Erythema: none Swelling: yes -- right hand can not close it for a few hours after waking up Heat or warmth: none Morning stiffness: yes Aggravating factors: lifting heavy Alleviating factors: nothing Relief with NSAIDs?: No NSAIDs Taken Treatments attempted:  rest and heat  Involved Joints:     Hands: yes bilateral    Wrists: yes bilateral     Elbows: yes bilateral    Shoulders: yes bilateral    Back: yes     Hips: yes bilateral    Knees: yes bilateral    Ankles: yes bilateral    Feet: yes bilateral   DEPRESSION/ANXIETY Continues on Prozac 40 MG daily and Klonopin 1 MG BID.  Pt is aware of risks of benzo medication use to include increased sedation, respiratory suppression, falls, dependence and cardiovascular events.  Pt would like  to continue treatment as benefit determined to outweigh risk -- has been on this for many years.  On PDMP review last Klonopin fill 12/128/23 for 1 MG tablets.  No other controlled substances noted.  She would like to see therapist and mental health specialist - struggling with transitions in life and feeling loss of purpose.  Her husband and her do not get along well.  Reports she is becoming more irritable.    Had a fall December 24th where blacked out without reason at daughter's house.  No injuries during the time.  She reports bruise on her back and hip at the time. Mood status: stable Satisfied with current treatment?: yes Symptom severity: mild  Duration of current treatment : chronic Side effects: no Medication compliance: good compliance Psychotherapy/counseling:  none Previous psychiatric medications: Prozac and Klonopin Depressed mood: no Anxious mood: yes Anhedonia: no Significant weight loss or gain: no Insomnia: yes hard to fall asleep -- endorses poor sleep pattern with hot flashes Fatigue: at times Feelings of worthlessness or guilt: none Impaired concentration/indecisiveness: yes Suicidal ideations: no Hopelessness: no Crying spells: no    08/30/2022    2:59 PM 06/09/2022    1:40 PM 05/23/2022    1:37 PM 02/10/2022   11:18 AM 11/14/2021    2:01 PM  Depression screen PHQ 2/9  Decreased Interest 2 2 0 2 2  Down, Depressed, Hopeless 3 1 0 2 1  PHQ - 2 Score 5 3 0 4 3  Altered sleeping 1 2 0 3 3  Tired, decreased energy 3 2 0 3 3  Change in appetite 1 1 0 2 1  Feeling bad or failure about yourself  2 1 0 2 1  Trouble concentrating 1 2 0 3 2  Moving slowly or fidgety/restless 0 1 0 1 2  Suicidal thoughts 0 0 0 0 0  PHQ-9 Score 13 12 0 18 15  Difficult doing work/chores Very difficult Somewhat difficult Not difficult at all Somewhat difficult Somewhat difficult      08/30/2022    3:00 PM 06/09/2022    1:40 PM 05/23/2022    1:37 PM 02/10/2022   11:19 AM  GAD 7 : Generalized Anxiety Score  Nervous, Anxious, on Edge '3 1  2  '$ Control/stop worrying 3 2 0 2  Worry too much - different things 3 2 0 2  Trouble relaxing 3 1 0 2  Restless 3 0 0 1  Easily annoyed or irritable 3 2 0 2  Afraid - awful might happen 3 1 0 2  Total GAD 7 Score '21 9  13  '$ Anxiety Difficulty Very difficult Somewhat difficult Not difficult at all Somewhat difficult      08/30/2022    3:24 PM 09/27/2020    2:05 PM 05/27/2018    1:40 PM 05/09/2017    1:27 PM  6CIT Screen  What Year? 0 points 0 points 0 points 0 points  What month? 0 points 0 points 0 points 0 points  What time? 0 points 0 points 0 points 0 points  Count back from 20 0 points 0 points 0 points 0 points  Months in reverse 0 points 2 points 0 points 0 points  Repeat phrase 0 points 8 points 2 points  6 points  Total Score 0 points 10 points 2 points 6 points   Relevant past medical, surgical, family and social history reviewed and updated as indicated. Interim medical history since our last visit reviewed. Allergies and medications reviewed and  updated.  Review of Systems  Constitutional:  Negative for activity change, appetite change, diaphoresis, fatigue and fever.  Respiratory:  Negative for cough, chest tightness, shortness of breath and wheezing.   Cardiovascular:  Negative for chest pain, palpitations and leg swelling.  Gastrointestinal: Negative.   Endocrine: Negative for cold intolerance and heat intolerance.  Musculoskeletal:  Positive for arthralgias.  Neurological: Negative.   Psychiatric/Behavioral:  Negative for decreased concentration, self-injury, sleep disturbance and suicidal ideas. The patient is nervous/anxious.    Per HPI unless specifically indicated above     Objective:    BP 139/78   Pulse 70   Temp 98 F (36.7 C) (Oral)   Ht 5' 0.98" (1.549 m)   Wt 148 lb 6.4 oz (67.3 kg)   SpO2 98%   BMI 28.05 kg/m   Wt Readings from Last 3 Encounters:  08/30/22 148 lb 6.4 oz (67.3 kg)  07/28/22 147 lb 9.6 oz (67 kg)  07/17/22 147 lb (66.7 kg)    Physical Exam Vitals and nursing note reviewed.  Constitutional:      General: She is awake. She is not in acute distress.    Appearance: She is well-developed, well-groomed and overweight. She is not ill-appearing or toxic-appearing.  HENT:     Head: Normocephalic.     Right Ear: Hearing and external ear normal.     Left Ear: Hearing and external ear normal.  Eyes:     General: Lids are normal.        Right eye: No discharge.        Left eye: No discharge.     Extraocular Movements: Extraocular movements intact.     Conjunctiva/sclera: Conjunctivae normal.     Pupils: Pupils are equal, round, and reactive to light.     Visual Fields: Right eye visual fields normal and left eye visual fields normal.  Neck:      Thyroid: No thyromegaly.     Vascular: No carotid bruit.  Cardiovascular:     Rate and Rhythm: Normal rate and regular rhythm.     Heart sounds: Normal heart sounds. No murmur heard.    No gallop.  Pulmonary:     Effort: Pulmonary effort is normal. No accessory muscle usage or respiratory distress.     Breath sounds: Normal breath sounds. No decreased breath sounds or wheezing.  Chest:  Breasts:    Right: Normal.     Left: Normal.  Abdominal:     General: Bowel sounds are normal.     Palpations: Abdomen is soft.     Tenderness: There is no right CVA tenderness, left CVA tenderness, guarding or rebound.     Hernia: No hernia is present.  Musculoskeletal:     Cervical back: Normal range of motion and neck supple.     Right lower leg: No edema.     Left lower leg: No edema.  Lymphadenopathy:     Cervical: No cervical adenopathy.     Upper Body:     Right upper body: No supraclavicular, axillary or pectoral adenopathy.     Left upper body: No supraclavicular, axillary or pectoral adenopathy.  Skin:    General: Skin is warm and dry.  Neurological:     Mental Status: She is alert and oriented to person, place, and time.     Cranial Nerves: Cranial nerves 2-12 are intact.     Sensory: Sensation is intact.     Motor: Motor function is intact.     Coordination: Coordination  is intact.     Gait: Gait is intact.     Deep Tendon Reflexes: Reflexes are normal and symmetric.     Reflex Scores:      Brachioradialis reflexes are 2+ on the right side and 2+ on the left side.      Patellar reflexes are 2+ on the right side and 2+ on the left side. Psychiatric:        Attention and Perception: Attention normal.        Mood and Affect: Mood normal.        Speech: Speech normal.        Behavior: Behavior normal. Behavior is cooperative.        Thought Content: Thought content normal.    Results for orders placed or performed during the hospital encounter of 07/17/22  Lipase, blood   Result Value Ref Range   Lipase 42 11 - 51 U/L  Comprehensive metabolic panel  Result Value Ref Range   Sodium 141 135 - 145 mmol/L   Potassium 4.3 3.5 - 5.1 mmol/L   Chloride 106 98 - 111 mmol/L   CO2 29 22 - 32 mmol/L   Glucose, Bld 92 70 - 99 mg/dL   BUN 10 8 - 23 mg/dL   Creatinine, Ser 0.77 0.44 - 1.00 mg/dL   Calcium 9.0 8.9 - 10.3 mg/dL   Total Protein 7.2 6.5 - 8.1 g/dL   Albumin 4.0 3.5 - 5.0 g/dL   AST 26 15 - 41 U/L   ALT 20 0 - 44 U/L   Alkaline Phosphatase 81 38 - 126 U/L   Total Bilirubin 0.7 0.3 - 1.2 mg/dL   GFR, Estimated >60 >60 mL/min   Anion gap 6 5 - 15  CBC  Result Value Ref Range   WBC 4.8 4.0 - 10.5 K/uL   RBC 4.40 3.87 - 5.11 MIL/uL   Hemoglobin 12.9 12.0 - 15.0 g/dL   HCT 39.2 36.0 - 46.0 %   MCV 89.1 80.0 - 100.0 fL   MCH 29.3 26.0 - 34.0 pg   MCHC 32.9 30.0 - 36.0 g/dL   RDW 12.9 11.5 - 15.5 %   Platelets 236 150 - 400 K/uL   nRBC 0.0 0.0 - 0.2 %  Urinalysis, Routine w reflex microscopic  Result Value Ref Range   Color, Urine YELLOW (A) YELLOW   APPearance CLEAR (A) CLEAR   Specific Gravity, Urine 1.013 1.005 - 1.030   pH 8.0 5.0 - 8.0   Glucose, UA NEGATIVE NEGATIVE mg/dL   Hgb urine dipstick NEGATIVE NEGATIVE   Bilirubin Urine NEGATIVE NEGATIVE   Ketones, ur NEGATIVE NEGATIVE mg/dL   Protein, ur NEGATIVE NEGATIVE mg/dL   Nitrite NEGATIVE NEGATIVE   Leukocytes,Ua NEGATIVE NEGATIVE      Assessment & Plan:   Problem List Items Addressed This Visit       Cardiovascular and Mediastinum   Aortic atherosclerosis (HCC)    Chronic, ongoing, noted on October 2019 lung screening CT.  Continue statin and ASA daily + recommend complete cessation of smoking.      Relevant Orders   Comprehensive metabolic panel   Lipid Panel w/o Chol/HDL Ratio   Coronary artery disease, non-occlusive    Chronic, stable.  Continue current medication regimen and collaboration with cardiology.      Relevant Orders   Comprehensive metabolic panel   Lipid  Panel w/o Chol/HDL Ratio     Respiratory   Centrilobular emphysema (HCC) - Primary    Chronic, ongoing.  No current inhaler regimen.  Denies any symptoms.  Continue yearly lung screening and plan on spirometry next visit to assess function.  Recommend complete cessation of smoking.        Relevant Orders   CBC with Differential/Platelet   TSH     Digestive   Barrett's esophagus    Chronic, ongoing.  Continue current medication regimen and adjust as needed.  Continue collaboration with GI, ?related to Alpha Gal and have discussed this diet with her.      Relevant Orders   Magnesium     Other   Allergy to alpha-gal    Ongoing.  Noted on labs with elevation, referral to allergist was placed, but she refuses to attend at this time, would benefit from this as suspect some of GI and pain related to this.  Have educated her at length multiple times on diet changes needed.      Anxiety    Chronic, ongoing.  Continue Klonopin at this time, patient educated on risks of medication and is not interested in reducing -- refills sent in.  Continue Prozac 40 MG which benefits anxiety and fibromyalgia, discussed with her changing to Zoloft or Duloxetine (which would benefit chronic pain issues) if increase anxiety/depression noted -- refuses.  At this time she denies SI/HI and wishes to maintain current regimen.  UDS yearly need and obtained today, contract up to date.  Return in 3 months. Referral to psychiatry and therapy, which would be beneficial as worsening mood present.      Relevant Medications   clonazePAM (KLONOPIN) 1 MG tablet (Start on 09/15/2022)   Other Relevant Orders   TSH   [id number] 11+Oxyco+Alc+Crt-Bund   Ambulatory referral to Psychiatry   Ambulatory referral to Psychology   Benzodiazepine dependence, continuous ([place])    Refer to anxiety plan for updates and discussion. UDS due and obtained today.      Relevant Orders   [id number] 11+Oxyco+Alc+Crt-Bund   Ambulatory referral to  Psychiatry   Ambulatory referral to Psychology   Conversion disorder with mixed symptoms    Refer to anxiety plan of care.      Relevant Orders   [id number] 11+Oxyco+Alc+Crt-Bund   Ambulatory referral to Psychiatry   Ambulatory referral to Psychology   Depression, recurrent ([place])    Chronic, ongoing.  Continue current medication regimen and adjust as needed.  Denies SI/HI.  Consider change from Prozac to Cymbalta in future, which would offer benefit with anxiety and depression + fibromyalgia/alpha gal -- she refuses this change today.  Very anxious about changes.  Referral to psychiatry and therapy, which would be beneficial as worsening mood present.      Relevant Orders   [id number] 11+Oxyco+Alc+Crt-Bund   Dietary vitamin B12 deficiency anemia    Chronic, ongoing, continue daily supplement and adjust as needed.  Check level today.      Relevant Orders   Vitamin B12   Fibromyalgia    Chronic, ongoing.  Recommended she try stopping Prozac and change to Duloxetine which would benefit mood and pain, she refuses this - fearful of change.  Recommend she continue Gabapentin.      Relevant Medications   clonazePAM (KLONOPIN) 1 MG tablet (Start on 09/15/2022)   Other Relevant Orders   CBC with Differential/Platelet   TSH   History of CVA in adulthood    11 years ago -- she has memory concerns, however increased anxiety recently.  Neuro exam and 6CIT reassuring today.  Will get into psychiatry for further evaluation mood  and if ongoing issues consider imaging.        Hyperlipidemia    Chronic, ongoing.  Continue current medication regimen and collaboration with cardiology.  Lipid panel today and CMP.      Relevant Orders   Comprehensive metabolic panel   Lipid Panel w/o Chol/HDL Ratio   Nicotine dependence, cigarettes, uncomplicated    I have recommended complete cessation of tobacco use. I have discussed various options available for assistance with tobacco cessation including over the  counter methods (Nicotine gum, patch and lozenges). We also discussed prescription options (Chantix, Nicotine Inhaler / Nasal Spray). The patient is not interested in pursuing any prescription tobacco cessation options at this time.       Vitamin D deficiency    Chronic, ongoing.  Continue daily supplement and adjust as needed.  Check level today.      Relevant Orders   VITAMIN D 25 Hydroxy (Vit-D Deficiency, Fractures)   Other Visit Diagnoses     High risk medication use       Chronic benzo use, need yearly drug screening.   Relevant Orders   [id number] 11+Oxyco+Alc+Crt-Bund   Breast pain, right       No masses noted, obtain breast imaging, ordered.   Relevant Orders   MM DIAG BREAST TOMO BILATERAL   US BREAST LTD UNI RIGHT INC AXILLA   Hypomagnesemia       Mag level today.   Relevant Orders   Magnesium   Fall, initial encounter       No injuries or further incidents, monitor and if recurrent further assessment.        Follow up plan: Return in about 3 months (around 11/29/2022) for COPD, MOOD, PAIN.

## 2022-08-30 NOTE — Assessment & Plan Note (Signed)
Chronic, ongoing.  Continue current medication regimen and adjust as needed.  Continue collaboration with GI, ?related to Alpha Gal and have discussed this diet with her.

## 2022-08-30 NOTE — Assessment & Plan Note (Signed)
I have recommended complete cessation of tobacco use. I have discussed various options available for assistance with tobacco cessation including over the counter methods (Nicotine gum, patch and lozenges). We also discussed prescription options (Chantix, Nicotine Inhaler / Nasal Spray). The patient is not interested in pursuing any prescription tobacco cessation options at this time.  

## 2022-08-30 NOTE — Assessment & Plan Note (Signed)
Ongoing.  Noted on labs with elevation, referral to allergist was placed, but she refuses to attend at this time, would benefit from this as suspect some of GI and pain related to this.  Have educated her at length multiple times on diet changes needed.

## 2022-08-30 NOTE — Assessment & Plan Note (Signed)
Chronic, ongoing, noted on October 2019 lung screening CT.  Continue statin and ASA daily + recommend complete cessation of smoking.

## 2022-08-30 NOTE — Assessment & Plan Note (Signed)
Chronic, ongoing.  Continue daily supplement and adjust as needed.  Check level today.

## 2022-08-30 NOTE — Assessment & Plan Note (Signed)
Chronic, ongoing.  Continue Klonopin at this time, patient educated on risks of medication and is not interested in reducing -- refills sent in.  Continue Prozac 40 MG which benefits anxiety and fibromyalgia, discussed with her changing to Zoloft or Duloxetine (which would benefit chronic pain issues) if increase anxiety/depression noted -- refuses.  At this time she denies SI/HI and wishes to maintain current regimen.  UDS yearly need and obtained today, contract up to date.  Return in 3 months. Referral to psychiatry and therapy, which would be beneficial as worsening mood present.

## 2022-08-30 NOTE — Telephone Encounter (Signed)
Patient looked at her bottle to see when it was filled and stated it was filled on 08/17/22 just as I said it was and that it was her mistake and will wait until the 26th to refill

## 2022-08-30 NOTE — Assessment & Plan Note (Signed)
Chronic, ongoing.  Continue current medication regimen and collaboration with cardiology.  Lipid panel today and CMP.

## 2022-08-30 NOTE — Assessment & Plan Note (Signed)
Refer to anxiety plan for updates and discussion. UDS due and obtained today.

## 2022-08-30 NOTE — Assessment & Plan Note (Signed)
Refer to anxiety plan of care. 

## 2022-08-31 LAB — CBC WITH DIFFERENTIAL/PLATELET
Basophils Absolute: 0 10*3/uL (ref 0.0–0.2)
Basos: 1 %
EOS (ABSOLUTE): 0.1 10*3/uL (ref 0.0–0.4)
Eos: 1 %
Hematocrit: 38.7 % (ref 34.0–46.6)
Hemoglobin: 12.8 g/dL (ref 11.1–15.9)
Immature Grans (Abs): 0 10*3/uL (ref 0.0–0.1)
Immature Granulocytes: 0 %
Lymphocytes Absolute: 2 10*3/uL (ref 0.7–3.1)
Lymphs: 39 %
MCH: 29.2 pg (ref 26.6–33.0)
MCHC: 33.1 g/dL (ref 31.5–35.7)
MCV: 88 fL (ref 79–97)
Monocytes Absolute: 0.4 10*3/uL (ref 0.1–0.9)
Monocytes: 8 %
Neutrophils Absolute: 2.5 10*3/uL (ref 1.4–7.0)
Neutrophils: 51 %
Platelets: 281 10*3/uL (ref 150–450)
RBC: 4.39 x10E6/uL (ref 3.77–5.28)
RDW: 12.5 % (ref 11.7–15.4)
WBC: 5.1 10*3/uL (ref 3.4–10.8)

## 2022-08-31 LAB — LIPID PANEL W/O CHOL/HDL RATIO
Cholesterol, Total: 135 mg/dL (ref 100–199)
HDL: 53 mg/dL (ref >39)
LDL Chol Calc (NIH): 69 mg/dL (ref 0–99)
Triglycerides: 63 mg/dL (ref 0–149)
VLDL Cholesterol Cal: 13 mg/dL (ref 5–40)

## 2022-08-31 LAB — TSH: TSH: 1.49 u[IU]/mL (ref 0.450–4.500)

## 2022-08-31 LAB — VITAMIN D 25 HYDROXY (VIT D DEFICIENCY, FRACTURES): Vit D, 25-Hydroxy: 21.7 ng/mL — ABNORMAL LOW (ref 30.0–100.0)

## 2022-08-31 LAB — COMPREHENSIVE METABOLIC PANEL
ALT: 15 IU/L (ref 0–32)
AST: 21 IU/L (ref 0–40)
Albumin/Globulin Ratio: 1.9 (ref 1.2–2.2)
Albumin: 4.3 g/dL (ref 3.9–4.9)
Alkaline Phosphatase: 96 IU/L (ref 44–121)
BUN/Creatinine Ratio: 10 — ABNORMAL LOW (ref 12–28)
BUN: 9 mg/dL (ref 8–27)
Bilirubin Total: 0.6 mg/dL (ref 0.0–1.2)
CO2: 26 mmol/L (ref 20–29)
Calcium: 9.3 mg/dL (ref 8.7–10.3)
Chloride: 102 mmol/L (ref 96–106)
Creatinine, Ser: 0.87 mg/dL (ref 0.57–1.00)
Globulin, Total: 2.3 g/dL (ref 1.5–4.5)
Glucose: 72 mg/dL (ref 70–99)
Potassium: 3.8 mmol/L (ref 3.5–5.2)
Sodium: 140 mmol/L (ref 134–144)
Total Protein: 6.6 g/dL (ref 6.0–8.5)
eGFR: 76 mL/min/{1.73_m2} (ref >59)

## 2022-08-31 LAB — MAGNESIUM: Magnesium: 1.9 mg/dL (ref 1.6–2.3)

## 2022-08-31 LAB — VITAMIN B12: Vitamin B-12: 314 pg/mL (ref 232–1245)

## 2022-08-31 NOTE — Progress Notes (Signed)
Good morning, please let Novice know her labs are overall stable and no medication changes needed, however her B12 level remains on lower side of normal which can affect memory.  I would like her to take over the counter Vitamin B12 1000 MCG daily.  Any questions? Keep being awesome!!  Thank you for allowing me to participate in your care.  I appreciate you. Kindest regards, Celie Desrochers

## 2022-09-01 LAB — DRUG SCREEN 764883 11+OXYCO+ALC+CRT-BUND
Amphetamines, Urine: NEGATIVE ng/mL
BENZODIAZ UR QL: NEGATIVE ng/mL
Barbiturate: NEGATIVE ng/mL
Cannabinoid Quant, Ur: NEGATIVE ng/mL
Cocaine (Metabolite): NEGATIVE ng/mL
Creatinine: 30 mg/dL (ref 20.0–300.0)
Ethanol: NEGATIVE %
Meperidine: NEGATIVE ng/mL
Methadone Screen, Urine: NEGATIVE ng/mL
OPIATE SCREEN URINE: NEGATIVE ng/mL
Oxycodone/Oxymorphone, Urine: NEGATIVE ng/mL
Phencyclidine: NEGATIVE ng/mL
Propoxyphene: NEGATIVE ng/mL
Tramadol: NEGATIVE ng/mL
pH, Urine: 5.6 (ref 4.5–8.9)

## 2022-09-04 ENCOUNTER — Encounter: Payer: Self-pay | Admitting: *Deleted

## 2022-09-08 ENCOUNTER — Inpatient Hospital Stay: Admission: RE | Admit: 2022-09-08 | Payer: Medicaid Other | Source: Ambulatory Visit

## 2022-09-11 ENCOUNTER — Ambulatory Visit: Payer: Medicare Other | Admitting: Nurse Practitioner

## 2022-09-11 DIAGNOSIS — H1132 Conjunctival hemorrhage, left eye: Secondary | ICD-10-CM | POA: Diagnosis not present

## 2022-09-14 ENCOUNTER — Ambulatory Visit
Admission: RE | Admit: 2022-09-14 | Discharge: 2022-09-14 | Disposition: A | Discharge status: Home / Self Care | Payer: Medicare Other | Source: Ambulatory Visit | Attending: Nurse Practitioner | Admitting: Nurse Practitioner

## 2022-09-14 DIAGNOSIS — N644 Mastodynia: Secondary | ICD-10-CM

## 2022-09-15 NOTE — Progress Notes (Signed)
Good afternoon, please let Lindsey Bautista know her breast imaging returned and is normal.  Can repeat in one year:)

## 2022-09-20 ENCOUNTER — Ambulatory Visit: Payer: Self-pay

## 2022-09-20 ENCOUNTER — Encounter: Payer: Self-pay | Admitting: Nurse Practitioner

## 2022-09-20 ENCOUNTER — Ambulatory Visit (INDEPENDENT_AMBULATORY_CARE_PROVIDER_SITE_OTHER): Payer: Medicare Other | Admitting: Nurse Practitioner

## 2022-09-20 VITALS — BP 121/76 | HR 66 | Temp 97.9°F | Ht 60.98 in | Wt 146.5 lb

## 2022-09-20 DIAGNOSIS — F339 Major depressive disorder, recurrent, unspecified: Secondary | ICD-10-CM

## 2022-09-20 DIAGNOSIS — B9689 Other specified bacterial agents as the cause of diseases classified elsewhere: Secondary | ICD-10-CM

## 2022-09-20 DIAGNOSIS — N760 Acute vaginitis: Secondary | ICD-10-CM

## 2022-09-20 DIAGNOSIS — N898 Other specified noninflammatory disorders of vagina: Secondary | ICD-10-CM | POA: Diagnosis not present

## 2022-09-20 LAB — URINALYSIS, ROUTINE W REFLEX MICROSCOPIC
Bilirubin, UA: NEGATIVE
Glucose, UA: NEGATIVE
Leukocytes,UA: NEGATIVE
Nitrite, UA: NEGATIVE
RBC, UA: NEGATIVE
Specific Gravity, UA: 1.02 (ref 1.005–1.030)
Urobilinogen, Ur: 0.2 mg/dL (ref 0.2–1.0)
pH, UA: 5.5 (ref 5.0–7.5)

## 2022-09-20 LAB — WET PREP FOR TRICH, YEAST, CLUE
Clue Cell Exam: POSITIVE — AB
Trichomonas Exam: NEGATIVE
Yeast Exam: NEGATIVE

## 2022-09-20 MED ORDER — CLINDAMYCIN PHOSPHATE 2 % VA CREA: 1.0000 | TOPICAL_CREAM | Freq: Every day | VAGINAL | 0 refills | Status: DC

## 2022-09-20 NOTE — Patient Instructions (Signed)

## 2022-09-20 NOTE — Telephone Encounter (Signed)
Chief Complaint: Abdominal pain Symptoms: Lower from umbilicus to pelvic area 9/10, swelling to area of the pubic hair line, vaginal odor, clear vaginal discharge Frequency: Onset 3 days ago, gotten worse Pertinent Negatives: Patient denies urine symptoms Disposition: '[]'$ ED /'[x]'$ Urgent Care (no appt availability in office) / '[]'$ Appointment(In office/virtual)/ '[]'$  [place] [facility]/ '[]'$ Home Care/ '[x]'$ Refused Recommended Disposition /'[]'$ [facility]/ '[]'$  Follow-up with PCP Additional Notes: Advised no available appointments with Jolene today as she doesn't want to see anyone else. Advised to go to the UC, she declined. She says the last time she was sick, she was worked in. Advised I will send this to the office for Jolene to review and someone will call her back with her recommendation. Patient verbalized understanding.   Reason for Disposition  [1] SEVERE pain (e.g., excruciating) AND [2] present > 1 hour  Answer Assessment - Initial Assessment Questions 1. LOCATION: "Where does it hurt?"      Belly button area 2. RADIATION: "Does the pain shoot anywhere else?" (e.g., chest, back)     Goes to the right side down to the pelvic area 3. ONSET: "When did the pain begin?" (e.g., minutes, hours or days ago)      3 days, not this bad 4. SUDDEN: "Gradual or sudden onset?"     Gradually gotten worse 5. PATTERN "Does the pain come and go, or is it constant?"    - If it comes and goes: "How long does it last?" "Do you have pain now?"     (Note: Comes and goes means the pain is intermittent. It goes away completely between bouts.)    - If constant: "Is it getting better, staying the same, or getting worse?"      (Note: Constant means the pain never goes away completely; most serious pain is constant and gets worse.)      Constant 6. SEVERITY: "How bad is the pain?"  (e.g., Scale 1-10; mild, moderate, or severe)    - MILD (1-3): Doesn't interfere with normal activities, abdomen soft and not  tender to touch.     - MODERATE (4-7): Interferes with normal activities or awakens from sleep, abdomen tender to touch.     - SEVERE (8-10): Excruciating pain, doubled over, unable to do any normal activities.       9 7. RECURRENT SYMPTOM: "Have you ever had this type of stomach pain before?" If Yes, ask: "When was the last time?" and "What happened that time?"      No pain like this 8. CAUSE: "What do you think is causing the stomach pain?"     Unknown 9. OTHER SYMPTOMS: "Do you have any other symptoms?" (e.g., back pain, diarrhea, fever, urination pain, vomiting)       Swollen to the pubic area, vaginal odor, clear vaginal discharge  Protocols used: Abdominal Pain - [facility]

## 2022-09-20 NOTE — Assessment & Plan Note (Signed)
Acute, has not had infection since January 2023.  Wet prep noting + clue cells, negative yeast and trich.  UA + ketone and protein.  At this time start Clindamycin gel, which she has tolerated in past without ADR.  Educated her on this.  Is recurrence happens consider GYN referral.

## 2022-09-20 NOTE — Telephone Encounter (Signed)
Pt scheduled today @ 2:40

## 2022-09-20 NOTE — Telephone Encounter (Signed)
Called and spoke to [place], [facility], she says she will send to [place].

## 2022-09-20 NOTE — Progress Notes (Signed)
BP 121/76   Pulse 66   Temp 97.9 F (36.6 C) (Oral)   Ht 5' 0.98" (1.549 m)   Wt 146 lb 8 oz (66.5 kg)   SpO2 96%   BMI 27.70 kg/m    Subjective:    Patient ID: Lindsey Bautista, female    DOB: 21-Dec-1960, 62 y.o.   MRN: [id number]  HPI: Lindsey Bautista is a 62 y.o. female  Chief Complaint  Patient presents with   abd/pelvic area    For the past 3 days   VAGINAL DISCHARGE & ABDOMINAL PAIN Present for 3 days, history of recurrent BV in past.  She is followed by GI for chronic abdominal pain issues, last visit 10/17/21.  She is following up with them in March.  Reports when she just self swabbed it hurt in vagina. Stays nauseous at baseline.  Used vaginal estrace in past, but this did not offer benefit.  Previously treated with Cleocin vaginal cream which she tolerated. Duration: weeks Discharge description: mucous  Pruritus: no Dysuria: no Malodorous: no Urinary frequency: no Fevers: no Abdominal pain: yes  Sexual activity: not sexually active History of sexually transmitted diseases: no Recent antibiotic use: no Context: history of BV Treatments attempted: none   Relevant past medical, surgical, family and social history reviewed and updated as indicated. Interim medical history since our last visit reviewed. Allergies and medications reviewed and updated.  Review of Systems  Constitutional:  Negative for activity change, appetite change, diaphoresis, fatigue and fever.  Respiratory: Negative.    Cardiovascular: Negative.   Gastrointestinal:  Positive for abdominal pain and nausea. Negative for abdominal distention, constipation, diarrhea and vomiting.  Genitourinary:  Positive for vaginal discharge and vaginal pain. Negative for vaginal bleeding.  Neurological: Negative.   Psychiatric/Behavioral: Negative.      Per HPI unless specifically indicated above     Objective:    BP 121/76   Pulse 66   Temp 97.9 F (36.6 C) (Oral)   Ht 5' 0.98" (1.549 m)   Wt 146  lb 8 oz (66.5 kg)   SpO2 96%   BMI 27.70 kg/m   Wt Readings from Last 3 Encounters:  09/20/22 146 lb 8 oz (66.5 kg)  08/30/22 148 lb 6.4 oz (67.3 kg)  07/28/22 147 lb 9.6 oz (67 kg)    Physical Exam Vitals and nursing note reviewed.  Constitutional:      General: She is awake. She is not in acute distress.    Appearance: She is well-developed, well-groomed and overweight. She is not ill-appearing.  HENT:     Head: Normocephalic.     Right Ear: Hearing normal.     Left Ear: Hearing normal.  Eyes:     General: Lids are normal.        Right eye: No discharge.        Left eye: No discharge.     Conjunctiva/sclera: Conjunctivae normal.     Pupils: Pupils are equal, round, and reactive to light.  Neck:     Thyroid: No thyromegaly.     Vascular: No carotid bruit.  Cardiovascular:     Rate and Rhythm: Normal rate and regular rhythm.     Heart sounds: Normal heart sounds. No murmur heard.    No gallop.  Pulmonary:     Effort: Pulmonary effort is normal. No accessory muscle usage or respiratory distress.     Breath sounds: Normal breath sounds.  Abdominal:     General: Bowel sounds are normal.  There is no distension.     Palpations: Abdomen is soft.     Tenderness: There is generalized abdominal tenderness. There is no right CVA tenderness, left CVA tenderness, guarding or rebound.     Hernia: No hernia is present.  Musculoskeletal:     Cervical back: Normal range of motion and neck supple.     Right lower leg: No edema.     Left lower leg: No edema.  Skin:    General: Skin is warm and dry.  Neurological:     Mental Status: She is alert and oriented to person, place, and time.  Psychiatric:        Attention and Perception: Attention normal.        Mood and Affect: Mood normal.        Behavior: Behavior normal. Behavior is cooperative.        Thought Content: Thought content normal.        Judgment: Judgment normal.     Results for orders placed or performed in visit on  08/30/22  CBC with Differential/Platelet  Result Value Ref Range   WBC 5.1 3.4 - 10.8 x10E3/uL   RBC 4.39 3.77 - 5.28 x10E6/uL   Hemoglobin 12.8 11.1 - 15.9 g/dL   Hematocrit 38.7 34.0 - 46.6 %   MCV 88 79 - 97 fL   MCH 29.2 26.6 - 33.0 pg   MCHC 33.1 31.5 - 35.7 g/dL   RDW 12.5 11.7 - 15.4 %   Platelets 281 150 - 450 x10E3/uL   Neutrophils 51 Not Estab. %   Lymphs 39 Not Estab. %   Monocytes 8 Not Estab. %   Eos 1 Not Estab. %   Basos 1 Not Estab. %   Neutrophils Absolute 2.5 1.4 - 7.0 x10E3/uL   Lymphocytes Absolute 2.0 0.7 - 3.1 x10E3/uL   Monocytes Absolute 0.4 0.1 - 0.9 x10E3/uL   EOS (ABSOLUTE) 0.1 0.0 - 0.4 x10E3/uL   Basophils Absolute 0.0 0.0 - 0.2 x10E3/uL   Immature Granulocytes 0 Not Estab. %   Immature Grans (Abs) 0.0 0.0 - 0.1 x10E3/uL  Comprehensive metabolic panel  Result Value Ref Range   Glucose 72 70 - 99 mg/dL   BUN 9 8 - 27 mg/dL   Creatinine, Ser 0.87 0.57 - 1.00 mg/dL   eGFR 76 >59 mL/min/1.73   BUN/Creatinine Ratio 10 (L) 12 - 28   Sodium 140 134 - 144 mmol/L   Potassium 3.8 3.5 - 5.2 mmol/L   Chloride 102 96 - 106 mmol/L   CO2 26 20 - 29 mmol/L   Calcium 9.3 8.7 - 10.3 mg/dL   Total Protein 6.6 6.0 - 8.5 g/dL   Albumin 4.3 3.9 - 4.9 g/dL   Globulin, Total 2.3 1.5 - 4.5 g/dL   Albumin/Globulin Ratio 1.9 1.2 - 2.2   Bilirubin Total 0.6 0.0 - 1.2 mg/dL   Alkaline Phosphatase 96 44 - 121 IU/L   AST 21 0 - 40 IU/L   ALT 15 0 - 32 IU/L  TSH  Result Value Ref Range   TSH 1.490 0.450 - 4.500 uIU/mL  Lipid Panel w/o Chol/HDL Ratio  Result Value Ref Range   Cholesterol, Total 135 100 - 199 mg/dL   Triglycerides 63 0 - 149 mg/dL   HDL 53 >39 mg/dL   VLDL Cholesterol Cal 13 5 - 40 mg/dL   LDL Chol Calc (NIH) 69 0 - 99 mg/dL  VITAMIN D 25 Hydroxy (Vit-D Deficiency, Fractures)  Result Value Ref Range  Vit D, 25-Hydroxy 21.7 (L) 30.0 - 100.0 ng/mL  Vitamin B12  Result Value Ref Range   Vitamin B-12 314 232 - 1,245 pg/mL  Magnesium  Result Value  Ref Range   Magnesium 1.9 1.6 - 2.3 mg/dL  080223 11+Oxyco+Alc+Crt-Bund  Result Value Ref Range   Ethanol Negative Cutoff=0.020 %   Amphetamines, Urine Negative Cutoff=1000 ng/mL   Barbiturate Negative Cutoff=200 ng/mL   BENZODIAZ UR QL Negative Cutoff=200 ng/mL   Cannabinoid Quant, Ur Negative Cutoff=50 ng/mL   Cocaine (Metabolite) Negative Cutoff=300 ng/mL   OPIATE SCREEN URINE Negative Cutoff=300 ng/mL   Oxycodone/Oxymorphone, Urine Negative Cutoff=300 ng/mL   Phencyclidine Negative Cutoff=25 ng/mL   Methadone Screen, Urine Negative Cutoff=300 ng/mL   Propoxyphene Negative Cutoff=300 ng/mL   Meperidine Negative Cutoff=200 ng/mL   Tramadol Negative Cutoff=200 ng/mL   Creatinine 30.0 20.0 - 300.0 mg/dL   pH, Urine 5.6 4.5 - 8.9      Assessment & Plan:   Problem List Items Addressed This Visit       Genitourinary   Bacterial vaginosis - Primary    Acute, has not had infection since January 2023.  Wet prep noting + clue cells, negative yeast and trich.  UA + ketone and protein.  At this time start Clindamycin gel, which she has tolerated in past without ADR.  Educated her on this.  Is recurrence happens consider GYN referral.      Relevant Orders   Urinalysis, Routine w reflex microscopic   WET PREP FOR TRICH, YEAST, CLUE     Other   Depression, recurrent ([place])   Relevant Orders   Ambulatory referral to Psychology     Follow up plan: Return in about 2 weeks (around 10/04/2022) for Bacterial vaginosis.

## 2022-10-01 NOTE — Patient Instructions (Signed)

## 2022-10-04 ENCOUNTER — Encounter: Payer: Self-pay | Admitting: Nurse Practitioner

## 2022-10-04 ENCOUNTER — Ambulatory Visit (INDEPENDENT_AMBULATORY_CARE_PROVIDER_SITE_OTHER): Payer: Medicare Other | Admitting: Nurse Practitioner

## 2022-10-04 VITALS — BP 134/78 | HR 60 | Temp 98.6°F | Ht 60.98 in | Wt 149.3 lb

## 2022-10-04 DIAGNOSIS — B9689 Other specified bacterial agents as the cause of diseases classified elsewhere: Secondary | ICD-10-CM | POA: Diagnosis not present

## 2022-10-04 DIAGNOSIS — N952 Postmenopausal atrophic vaginitis: Secondary | ICD-10-CM

## 2022-10-04 DIAGNOSIS — N760 Acute vaginitis: Secondary | ICD-10-CM | POA: Diagnosis not present

## 2022-10-04 MED ORDER — CLINDAMYCIN PHOSPHATE 2 % VA CREA: 1.0000 | TOPICAL_CREAM | Freq: Every day | VAGINAL | 0 refills | Status: AC

## 2022-10-04 NOTE — Progress Notes (Signed)
BP 134/78 (BP Location: Left Arm, Patient Position: Sitting, Cuff Size: Normal)   Pulse 60   Temp 98.6 F (37 C) (Oral)   Ht 5' 0.98" (1.549 m)   Wt 149 lb 4.8 oz (67.7 kg)   SpO2 97%   BMI 28.22 kg/m    Subjective:    Patient ID: Lindsey Bautista, female    DOB: [birth year], 62 y.o.   MRN: [id number]  HPI: Lindsey Bautista is a 62 y.o. female  Chief Complaint  Patient presents with   BV    2 week follow ukp   VAGINAL DISCHARGE & ABDOMINAL PAIN History of recurrent BV in past, recent treatment with Clindamycin on 09/20/22.  She is followed by GI for chronic abdominal pain issues, last visit 10/17/21.  She reports some improvement in symptoms, but some ongoing odor.    Used vaginal estrace in past, but this did not offer benefit.  Previously treated with Cleocin vaginal cream which she tolerated. Duration: weeks Discharge description: mucous , white Pruritus: no Dysuria: no Malodorous: yes Urinary frequency: no Fevers: no Abdominal pain: no Sexual activity: not sexually active History of sexually transmitted diseases: no Recent antibiotic use: no Context: history of BV Treatments attempted: none   Relevant past medical, surgical, family and social history reviewed and updated as indicated. Interim medical history since our last visit reviewed. Allergies and medications reviewed and updated.  Review of Systems  Constitutional:  Negative for activity change, appetite change, diaphoresis, fatigue and fever.  Respiratory: Negative.    Cardiovascular: Negative.   Gastrointestinal: Negative.   Genitourinary:  Positive for vaginal discharge. Negative for vaginal bleeding and vaginal pain.  Neurological: Negative.   Psychiatric/Behavioral: Negative.      Per HPI unless specifically indicated above     Objective:    BP 134/78 (BP Location: Left Arm, Patient Position: Sitting, Cuff Size: Normal)   Pulse 60   Temp 98.6 F (37 C) (Oral)   Ht 5' 0.98" (1.549 m)   Wt 149 lb  4.8 oz (67.7 kg)   SpO2 97%   BMI 28.22 kg/m   Wt Readings from Last 3 Encounters:  10/04/22 149 lb 4.8 oz (67.7 kg)  09/20/22 146 lb 8 oz (66.5 kg)  08/30/22 148 lb 6.4 oz (67.3 kg)    Physical Exam Vitals and nursing note reviewed.  Constitutional:      General: She is awake. She is not in acute distress.    Appearance: She is well-developed, well-groomed and overweight. She is not ill-appearing.  HENT:     Head: Normocephalic.     Right Ear: Hearing normal.     Left Ear: Hearing normal.  Eyes:     General: Lids are normal.        Right eye: No discharge.        Left eye: No discharge.     Conjunctiva/sclera: Conjunctivae normal.     Pupils: Pupils are equal, round, and reactive to light.  Neck:     Thyroid: No thyromegaly.     Vascular: No carotid bruit.  Cardiovascular:     Rate and Rhythm: Normal rate and regular rhythm.     Heart sounds: Normal heart sounds. No murmur heard.    No gallop.  Pulmonary:     Effort: Pulmonary effort is normal. No accessory muscle usage or respiratory distress.     Breath sounds: Normal breath sounds.  Abdominal:     General: Bowel sounds are normal. There is no distension.  Palpations: Abdomen is soft.     Tenderness: There is no abdominal tenderness. There is no right CVA tenderness, left CVA tenderness, guarding or rebound.     Hernia: No hernia is present.  Musculoskeletal:     Cervical back: Normal range of motion and neck supple.     Right lower leg: No edema.     Left lower leg: No edema.  Skin:    General: Skin is warm and dry.  Neurological:     Mental Status: She is alert and oriented to person, place, and time.  Psychiatric:        Attention and Perception: Attention normal.        Mood and Affect: Mood normal.        Behavior: Behavior normal. Behavior is cooperative.        Thought Content: Thought content normal.        Judgment: Judgment normal.    Results for orders placed or performed in visit on 09/20/22   WET PREP FOR [place], YEAST, CLUE   Specimen: Urine   Urine  Result Value Ref Range   Trichomonas Exam Negative Negative   Yeast Exam Negative Negative   Clue Cell Exam Positive (A) Negative  Urinalysis, Routine w reflex microscopic  Result Value Ref Range   Specific Gravity, UA 1.020 1.005 - 1.030   pH, UA 5.5 5.0 - 7.5   Color, UA Yellow Yellow   Appearance Ur Clear Clear   Leukocytes,UA Negative Negative   Protein,UA Trace (A) Negative/Trace   Glucose, UA Negative Negative   Ketones, UA Trace (A) Negative   RBC, UA Negative Negative   Bilirubin, UA Negative Negative   Urobilinogen, Ur 0.2 0.2 - 1.0 mg/dL   Nitrite, UA Negative Negative   Microscopic Examination Comment       Assessment & Plan:   Problem List Items Addressed This Visit       Genitourinary   Atrophic vaginitis    Ongoing.  Suspect reason for BV -- refer to BV plan of care.      Bacterial vaginosis - Primary    Ongoing odor reported.  Recent BV infection.  Has not had infection since January 2023.  Unabl to retest today due to equipment.  At this time restart Clindamycin gel, which she has tolerated in past without ADR.  Educated her on this.  Is recurrence happens consider GYN referral.        Follow up plan: Return in about 12 days (around 10/16/2022) for Bacterial Vaginosis.

## 2022-10-04 NOTE — Assessment & Plan Note (Signed)
Ongoing odor reported.  Recent BV infection.  Has not had infection since January 2023.  Unabl to retest today due to equipment.  At this time restart Clindamycin gel, which she has tolerated in past without ADR.  Educated her on this.  Is recurrence happens consider GYN referral.

## 2022-10-04 NOTE — Assessment & Plan Note (Signed)
Ongoing.  Suspect reason for BV -- refer to BV plan of care.

## 2022-10-09 ENCOUNTER — Ambulatory Visit (INDEPENDENT_AMBULATORY_CARE_PROVIDER_SITE_OTHER): Payer: Medicare Other

## 2022-10-09 VITALS — Wt 149.0 lb

## 2022-10-09 DIAGNOSIS — Z0000 Encounter for general adult medical examination without abnormal findings: Secondary | ICD-10-CM

## 2022-10-09 NOTE — Progress Notes (Signed)
I connected with  BLONDIE CLUNE on 10/09/22 by a audio enabled telemedicine application and verified that I am speaking with the correct person using two identifiers.  Patient Location: Home  Provider Location: Home Office  I discussed the limitations of evaluation and management by telemedicine. The patient expressed understanding and agreed to proceed.  Subjective:   Lindsey Bautista is a 62 y.o. female who presents for Medicare Annual (Subsequent) preventive examination.  Review of Systems     Cardiac Risk Factors include: advanced age (>28mn, >>46women);smoking/ tobacco exposure;family history of premature cardiovascular disease     Objective:    Today's Vitals   10/09/22 1302  PainSc: 4    There is no height or weight on file to calculate BMI.     10/09/2022    1:19 PM 07/17/2022    5:00 PM 05/18/2022   12:38 PM 05/04/2022    9:37 AM 10/11/2021   11:47 AM 10/06/2021   12:15 PM 09/27/2020    1:56 PM  Advanced Directives  Does Patient Have a Medical Advance Directive? No No No No No No No  Would patient like information on creating a medical advance directive? No - Patient declined No - Patient declined No - Patient declined Yes (MAU/Ambulatory/Procedural Areas - Information given)  No - Patient declined     Current Medications (verified) Outpatient Encounter Medications as of 10/09/2022  Medication Sig   aspirin 81 MG tablet Take 81 mg by mouth daily.   atorvastatin (LIPITOR) 80 MG tablet Take 1 tablet by mouth once daily   clindamycin (CLEOCIN) 2 % vaginal cream Place 1 Applicatorful vaginally at bedtime for 7 days.   clonazePAM (KLONOPIN) 1 MG tablet Take 1 tablet (1 mg total) by mouth 2 (two) times daily.   dimenhyDRINATE (DRAMAMINE) 50 MG tablet Take 50 mg by mouth every 8 (eight) hours as needed.   docusate sodium (COLACE) 100 MG capsule Take 100 mg by mouth daily.    EPINEPHrine 0.3 mg/0.3 mL IJ SOAJ injection Inject 0.3 mg into the muscle as needed for anaphylaxis.    estradiol (ESTRACE) 0.5 MG tablet Take 1 tablet by mouth once daily   ezetimibe (ZETIA) 10 MG tablet Take 1 tablet by mouth once daily   FLUoxetine (PROZAC) 40 MG capsule Take 1 capsule by mouth once daily   ibuprofen (ADVIL) 800 MG tablet Take 800 mg by mouth every 8 (eight) hours as needed.   LINZESS 72 MCG capsule TAKE 1 CAPSULE BY MOUTH ONCE DAILY BEFORE BREAKFAST   nitroGLYCERIN (NITROSTAT) 0.4 MG SL tablet Place 1 tablet (0.4 mg total) under the tongue every 5 (five) minutes as needed for chest pain.   dexlansoprazole (DEXILANT) 60 MG capsule Take 60 mg by mouth daily. (Patient not taking: Reported on 10/09/2022)   HYDROcodone-acetaminophen (NORCO/VICODIN) 5-325 MG tablet Take 1 tablet by mouth every 6 (six) hours as needed for severe pain. (Patient not taking: Reported on 10/09/2022)   omeprazole (PRILOSEC) 20 MG capsule Take 1 capsule (20 mg total) by mouth 2 (two) times daily before a meal. (Patient not taking: Reported on 10/09/2022)   No facility-administered encounter medications on file as of 10/09/2022.    Allergies (verified) Flagyl [metronidazole], Penicillin g, Iodinated contrast media, Alpha-gal, and Flomax [tamsulosin hcl]   History: Past Medical History:  Diagnosis Date   Arthritis    Atypical chest pain    a. 08/2016 Neg MV.   COPD (chronic obstructive pulmonary disease) (HCC)    Coronary artery disease, non-occlusive  a.  LHC 06/19/2018: Mid LAD 50% with iFR 0.94   Depression    Diastolic dysfunction    a. 10/2016 Echo: EF 60-65%, no rwma, Gr1 DD, mild MR, nl LA size, nl RV fxn; b. 05/2018 Echo: EF 60-65%. No rwma. Mild MR. Nl RV fxn.   Fibromyalgia    GERD (gastroesophageal reflux disease)    Hypotension    IBS (irritable bowel syndrome)    Right arm weakness    Situational syncope    Stroke ([place])    2013, 2018.  Some short term memory issues, some weakness   TIA (transient ischemic attack)    Wears dentures    full upper   Past Surgical History:   Procedure Laterality Date   CATARACT EXTRACTION W/PHACO Right 05/04/2022   Procedure: CATARACT EXTRACTION PHACO AND INTRAOCULAR LENS PLACEMENT ([facility]) RIGHT 3.89 00:47.4;  Surgeon: Norvel Richards, MD;  Location: [facility];  Service: Ophthalmology;  Laterality: Right;   CATARACT EXTRACTION W/PHACO Left 05/18/2022   Procedure: CATARACT EXTRACTION PHACO AND INTRAOCULAR LENS PLACEMENT ([place]) LEFT 1.44 00:19.7;  Surgeon: Norvel Richards, MD;  Location: [facility];  Service: Ophthalmology;  Laterality: Left;   COLONOSCOPY WITH PROPOFOL N/A 11/22/2015   Procedure: COLONOSCOPY WITH PROPOFOL;  Surgeon: Manya Silvas, MD;  Location: [facility] ENDOSCOPY;  Service: Endoscopy;  Laterality: N/A;   ESOPHAGOGASTRODUODENOSCOPY (EGD) WITH PROPOFOL N/A 11/22/2015   Procedure: ESOPHAGOGASTRODUODENOSCOPY (EGD) WITH PROPOFOL;  Surgeon: Manya Silvas, MD;  Location: [facility] ENDOSCOPY;  Service: Endoscopy;  Laterality: N/A;   INTRAVASCULAR PRESSURE WIRE/FFR STUDY N/A 06/19/2018   Procedure: INTRAVASCULAR PRESSURE WIRE/FFR STUDY;  Surgeon: Nelva Bush, MD;  Location: [facility];  Service: Cardiovascular;  Laterality: N/A;   LEFT HEART CATH AND CORONARY ANGIOGRAPHY N/A 06/19/2018   Procedure: LEFT HEART CATH AND CORONARY ANGIOGRAPHY;  Surgeon: Nelva Bush, MD;  Location: [place] [facility];  Service: Cardiovascular;  Laterality: N/A;   OOPHORECTOMY     TONSILLECTOMY     VAGINAL HYSTERECTOMY     Family History  Problem Relation Age of Onset   Bladder Cancer Mother 48   Heart failure Mother    Renal Disease Mother    COPD Father    Bladder Cancer Maternal Aunt    Bladder Cancer Maternal Aunt    Breast cancer Maternal Aunt    Breast cancer Paternal Aunt 73   Colon cancer Maternal Grandmother    Cancer Paternal Grandfather        unk primary, metastatic, d 25s   Heart disease Other    COPD Other    Stroke Other    Prostate cancer Cousin        metastatic   Prostate  cancer Cousin    Social History   Socioeconomic History   Marital status: Married    Spouse name: Jenetta Downer Defrank   Number of children: 2   Years of education: 12   Highest education level: High school graduate  Occupational History   Occupation: Unemployed  Tobacco Use   Smoking status: Every Day    Packs/day: 0.75    Years: 45.00    Total pack years: 33.75    Types: Cigarettes   Smokeless tobacco: Never   Tobacco comments:    Started smoking age 39  Vaping Use   Vaping Use: Never used  Substance and Sexual Activity   Alcohol use: No    Alcohol/week: 0.0 standard drinks of alcohol   Drug use: No   Sexual activity: Not  Currently    Birth control/protection: Surgical    Comment: Hysterectomy  Other Topics Concern   Not on file  Social History Narrative   Lives at home with husband.    Caffeine use: 1 cup coffee per day   Social Determinants of Health   Financial Resource Strain: Low Risk  (10/09/2022)   Overall Financial Resource Strain (CARDIA)    Difficulty of Paying Living Expenses: Not hard at all  Food Insecurity: No Food Insecurity (10/09/2022)   Hunger Vital Sign    Worried About Running Out of Food in the Last Year: Never true    Ran Out of Food in the Last Year: Never true  Transportation Needs: No Transportation Needs (10/09/2022)   PRAPARE - Hydrologist (Medical): No    Lack of Transportation (Non-Medical): No  Physical Activity: Inactive (10/09/2022)   Exercise Vital Sign    Days of Exercise per Week: 0 days    Minutes of Exercise per Session: 0 min  Stress: No Stress Concern Present (10/09/2022)   [place]    Feeling of Stress : Only a little  Social Connections: Moderately Isolated (10/09/2022)   Social Connection and Isolation Panel [NHANES]    Frequency of Communication with Friends and Family: Once a week    Frequency of Social Gatherings with  Friends and Family: Twice a week    Attends Religious Services: Never    Printmaker: No    Attends Music therapist: Never    Marital Status: Married    Tobacco Counseling Ready to quit: Not Answered Counseling given: Not Answered Tobacco comments: Started smoking age 55   Clinical Intake:  Pre-visit preparation completed: Yes  Pain : 0-10 Pain Score: 4  Pain Type: Chronic pain Pain Location: Abdomen Pain Orientation: Lower Pain Descriptors / Indicators: Aching, Discomfort     Nutritional Risks: None Diabetes: No  How often do you need to have someone help you when you read instructions, pamphlets, or other written materials from your doctor or pharmacy?: 1 - Never  Diabetic?no  Interpreter Needed?: No  Information entered by :: Kirke Shaggy, LPN   Activities of Daily Living    10/09/2022    1:20 PM 05/18/2022   12:37 PM  In your present state of health, do you have any difficulty performing the following activities:  Hearing? 0 0  Vision? 0 0  Difficulty concentrating or making decisions? 0 1  Walking or climbing stairs? 0 0  Dressing or bathing? 0 0  Doing errands, shopping? 0   Preparing Food and eating ? N   Using the Toilet? N   In the past six months, have you accidently leaked urine? N   Do you have problems with loss of bowel control? N   Managing your Medications? N   Managing your Finances? N   Housekeeping or managing your Housekeeping? N     Patient Care Team: Venita Lick, NP as PCP - General (Nurse Practitioner) Minna Merritts, MD as PCP - Cardiology (Cardiology) Abisogun, Domenica Reamer, MD as Consulting Physician (Endocrinology) Minna Merritts, MD as Consulting Physician (Cardiology) Greg Cutter, [name] as Social Worker (Licensed Clinical Social Worker)  Indicate any recent [place] you may have received from other than [facility] providers in the past year (date may be  approximate).     Assessment:   This is a routine wellness examination for  Mariann Laster.  Hearing/Vision screen Hearing Screening - Comments:: No aids Vision Screening - Comments:: Readers- Dr.Hampton  Dietary issues and exercise activities discussed: Current Exercise Habits: The patient does not participate in regular exercise at present   Goals Addressed             This Visit's Progress    DIET - EAT MORE FRUITS AND VEGETABLES        Depression Screen    10/09/2022    1:10 PM 08/30/2022    2:59 PM 06/09/2022    1:40 PM 05/23/2022    1:37 PM 02/10/2022   11:18 AM 11/14/2021    2:01 PM 10/06/2021   12:22 PM  PHQ 2/9 Scores  PHQ - 2 Score 1 5 3 $ 0 4 3 3  $ PHQ- 9 Score 3 13 12 $ 0 18 15 4    $ Fall Risk    10/09/2022    1:20 PM 08/30/2022    2:53 PM 05/23/2022    1:37 PM 02/10/2022   11:18 AM 11/14/2021    2:01 PM  Fall Risk   Falls in the past year? 1 1 0 0   Number falls in past yr: 0 1 0 1 0  Injury with Fall? 0 1 0 0 0  Risk for fall due to : History of fall(s) History of fall(s) No Fall Risks History of fall(s) No Fall Risks  Follow up Falls evaluation completed;Falls prevention discussed Falls evaluation completed Falls evaluation completed Falls evaluation completed Falls evaluation completed    FALL RISK PREVENTION PERTAINING TO THE HOME:  Any stairs in or around the home? Yes  If so, are there any without handrails? No  Home free of loose throw rugs in walkways, pet beds, electrical cords, etc? Yes  Adequate lighting in your home to reduce risk of falls? Yes   ASSISTIVE DEVICES UTILIZED TO PREVENT FALLS:  Life alert? No  Use of a cane, walker or w/c? No  Grab bars in the bathroom? No  Shower chair or bench in shower? No  Elevated toilet seat or a handicapped toilet? No    Cognitive Function:completed on 08/30/22        08/30/2022    3:24 PM 09/27/2020    2:05 PM 05/27/2018    1:40 PM 05/09/2017    1:27 PM  6CIT Screen  What Year? 0 points 0 points 0 points 0  points  What month? 0 points 0 points 0 points 0 points  What time? 0 points 0 points 0 points 0 points  Count back from 20 0 points 0 points 0 points 0 points  Months in reverse 0 points 2 points 0 points 0 points  Repeat phrase 0 points 8 points 2 points 6 points  Total Score 0 points 10 points 2 points 6 points    Immunizations Immunization History  Administered Date(s) Administered   Influenza,inj,Quad PF,6+ Mos 06/21/2015, 05/09/2017   Moderna Sars-Covid-2 Vaccination 05/09/2020, 06/06/2020   Td 08/21/2001    TDAP status: Due, Education has been provided regarding the importance of this vaccine. Advised may receive this vaccine at local pharmacy or Health Dept. Aware to provide a copy of the vaccination record if obtained from local pharmacy or Health Dept. Verbalized acceptance and understanding.  Flu Vaccine status: Declined, Education has been provided regarding the importance of this vaccine but patient still declined. Advised may receive this vaccine at local pharmacy or Health Dept. Aware to provide a copy of the vaccination record if obtained from local pharmacy  or Health Dept. Verbalized acceptance and understanding.  Pneumococcal vaccine status: Declined,  Education has been provided regarding the importance of this vaccine but patient still declined. Advised may receive this vaccine at local pharmacy or Health Dept. Aware to provide a copy of the vaccination record if obtained from local pharmacy or Health Dept. Verbalized acceptance and understanding.   Covid-19 vaccine status: Completed vaccines  Qualifies for Shingles Vaccine? Yes   Zostavax completed No   Shingrix Completed?: No.    Education has been provided regarding the importance of this vaccine. Patient has been advised to call insurance company to determine out of pocket expense if they have not yet received this vaccine. Advised may also receive vaccine at local pharmacy or Health Dept. Verbalized acceptance and  understanding.  Screening Tests Health Maintenance  Topic Date Due   DTaP/Tdap/Td (2 - Tdap) 08/22/2011   Lung Cancer Screening  10/11/2022   INFLUENZA VACCINE  11/19/2022 (Originally 03/21/2022)   MAMMOGRAM  09/15/2023   Medicare Annual Wellness (AWV)  10/10/2023   COLONOSCOPY (Pts 45-47yr Insurance coverage will need to be confirmed)  02/12/2025   Hepatitis C Screening  Completed   HIV Screening  Completed   HPV VACCINES  Aged Out   COVID-19 Vaccine  Discontinued   Zoster Vaccines- Shingrix  Discontinued    Health Maintenance  Health Maintenance Due  Topic Date Due   DTaP/Tdap/Td (2 - Tdap) 08/22/2011   Lung Cancer Screening  10/11/2022    Colorectal cancer screening: Type of screening: Colonoscopy. Completed 02/13/20. Repeat every 5 years  Mammogram status: Completed 09/14/22. Repeat every year  Lung Cancer Screening: (Low Dose CT Chest recommended if Age 62-80years, 30 pack-year currently smoking OR have quit w/in 15years.) does qualify.   Lung Cancer Screening Referral: scheduled for 10/11/22  Additional Screening:  Hepatitis C Screening: does qualify; Completed 03/30/16  Vision Screening: Recommended annual ophthalmology exams for early detection of glaucoma and other disorders of the eye. Is the patient up to date with their annual eye exam?  Yes  Who is the provider or what is the name of the office in which the patient attends annual eye exams? Dr.Hampton If pt is not established with a provider, would they like to be referred to a provider to establish care? No .   Dental Screening: Recommended annual dental exams for proper oral hygiene  Community Resource Referral / Chronic Care Management: CRR required this visit?  No   CCM required this visit?  No      Plan:     I have personally reviewed and noted the following in the patient's chart:   Medical and social history Use of alcohol, tobacco or illicit drugs  Current medications and supplements  including opioid prescriptions. Patient is currently taking opioid prescriptions. Information provided to patient regarding non-opioid alternatives. Patient advised to discuss non-opioid treatment plan with their provider. Functional ability and status Nutritional status Physical activity Advanced directives List of other physicians Hospitalizations, surgeries, and ER visits in previous 12 months Vitals Screenings to include cognitive, depression, and falls Referrals and appointments  In addition, I have reviewed and discussed with patient certain preventive protocols, quality metrics, and best practice recommendations. A written personalized care plan for preventive services as well as general preventive health recommendations were provided to patient.     LDionisio David LPN   [id number]  Nurse Notes: none

## 2022-10-09 NOTE — Patient Instructions (Signed)
Lindsey Bautista , Thank you for taking time to come for your Medicare Wellness Visit. I appreciate your ongoing commitment to your health goals. Please review the following plan we discussed and let me know if I can assist you in the future.   These are the goals we discussed:  Goals       "I want to alleviate stress as much as possible" (pt-stated)      Current Barriers:  Mental Health Concerns  Limited education about appropriate self-care methods to implement into her daily routine to combat depression and stress* Limited access to caregiver- in terms of caring of her sick mother  Leodis [place] knowledge of community resource: Available mental health support services  Clinical Social Work Clinical Goal(s):  Over the next 90 days, patient will work with [name] to address needs related to learning healthy stress management  Interventions: Patient interviewed and appropriate assessments performed [name] provided talk therapy during session  Patient was able to identify key triggers that cause her anxiety/stress Provided mental health counseling with regard to Depression Discussed plans with patient for ongoing care management follow up and provided patient with direct contact information for care management team Assisted patient/caregiver with obtaining information about health plan benefits  Patient Self Care Activities:  Self administers medications as prescribed Attends all scheduled provider appointments Calls provider office for new concerns or questions  Initial goal documentation       DIET - EAT [place]      Patient Stated      09/27/2020, no goals      Quit Smoking      Quit smoking / using tobacco      Smoking cessation discussed         This is a list of the screening recommended for you and due dates:  Health Maintenance  Topic Date Due   DTaP/Tdap/Td vaccine (2 - Tdap) 08/22/2011   Screening for Lung Cancer  10/11/2022   Flu Shot  11/19/2022*   Mammogram   09/15/2023   Medicare Annual Wellness Visit  10/10/2023   Colon Cancer Screening  02/12/2025   Hepatitis C Screening: USPSTF Recommendation to screen - Ages 18-79 yo.  Completed   HIV Screening  Completed   HPV Vaccine  Aged Out   COVID-19 Vaccine  Discontinued   Zoster (Shingles) Vaccine  Discontinued  *Topic was postponed. The date shown is not the original due date.    Advanced directives: no  Conditions/risks identified: none  Next appointment: Follow up in one year for your annual wellness visit. 10/15/23 @ 11:15 am by phone  Preventive Care 40-64 Years, Female Preventive care refers to lifestyle choices and visits with your health care provider that can promote health and wellness. What does preventive care include? A yearly physical exam. This is also called an annual well check. Dental exams once or twice a year. Routine eye exams. Ask your health care provider how often you should have your eyes checked. Personal lifestyle choices, including: Daily care of your teeth and gums. Regular physical activity. Eating a healthy diet. Avoiding tobacco and drug use. Limiting alcohol use. Practicing safe sex. Taking low-dose aspirin daily starting at age 38. Taking vitamin and mineral supplements as recommended by your health care provider. What happens during an annual well check? The services and screenings done by your health care provider during your annual well check will depend on your age, overall health, lifestyle risk factors, and family history of disease. Counseling  Your  health care provider may ask you questions about your: Alcohol use. Tobacco use. Drug use. Emotional well-being. Home and relationship well-being. Sexual activity. Eating habits. Work and work Statistician. Method of birth control. Menstrual cycle. Pregnancy history. Screening  You may have the following tests or measurements: Height, weight, and BMI. Blood pressure. Lipid and cholesterol  levels. These may be checked every 5 years, or more frequently if you are over 33 years old. Skin check. Lung cancer screening. You may have this screening every year starting at age 68 if you have a 30-pack-year history of smoking and currently smoke or have quit within the past 15 years. Fecal occult blood test (FOBT) of the stool. You may have this test every year starting at age 71. Flexible sigmoidoscopy or colonoscopy. You may have a sigmoidoscopy every 5 years or a colonoscopy every 10 years starting at age 75. Hepatitis C blood test. Hepatitis B blood test. Sexually transmitted disease (STD) testing. Diabetes screening. This is done by checking your blood sugar (glucose) after you have not eaten for a while (fasting). You may have this done every 1-3 years. Mammogram. This may be done every 1-2 years. Talk to your health care provider about when you should start having regular mammograms. This may depend on whether you have a family history of breast cancer. BRCA-related cancer screening. This may be done if you have a family history of breast, ovarian, tubal, or peritoneal cancers. Pelvic exam and Pap test. This may be done every 3 years starting at age 65. Starting at age 72, this may be done every 5 years if you have a Pap test in combination with an HPV test. Bone density scan. This is done to screen for osteoporosis. You may have this scan if you are at high risk for osteoporosis. Discuss your test results, treatment options, and if necessary, the need for more tests with your health care provider. Vaccines  Your health care provider may recommend certain vaccines, such as: Influenza vaccine. This is recommended every year. Tetanus, diphtheria, and acellular pertussis (Tdap, Td) vaccine. You may need a Td booster every 10 years. Zoster vaccine. You may need this after age 16. Pneumococcal 13-valent conjugate (PCV13) vaccine. You may need this if you have certain conditions and were  not previously vaccinated. Pneumococcal polysaccharide (PPSV23) vaccine. You may need one or two doses if you smoke cigarettes or if you have certain conditions. Talk to your health care provider about which screenings and vaccines you need and how often you need them. This information is not intended to replace advice given to you by your health care provider. Make sure you discuss any questions you have with your health care provider. Document Released: 09/03/2015 Document Revised: 04/26/2016 Document Reviewed: 06/08/2015 Elsevier Interactive Patient Education  2017 [place] Prevention in the Home Falls can cause injuries. They can happen to people of all ages. There are many things you can do to make your home safe and to help prevent falls. What can I do on the outside of my home? Regularly fix the edges of walkways and driveways and fix any cracks. Remove anything that might make you trip as you walk through a door, such as a raised step or threshold. Trim any bushes or trees on the path to your home. Use bright outdoor lighting. Clear any walking paths of anything that might make someone trip, such as rocks or tools. Regularly check to see if handrails are loose or broken. Make  sure that both sides of any steps have handrails. Any raised decks and porches should have guardrails on the edges. Have any leaves, snow, or ice cleared regularly. Use sand or salt on walking paths during winter. Clean up any spills in your garage right away. This includes oil or grease spills. What can I do in the bathroom? Use night lights. Install grab bars by the toilet and in the tub and shower. Do not use towel bars as grab bars. Use non-skid mats or decals in the tub or shower. If you need to sit down in the shower, use a plastic, non-slip stool. Keep the floor dry. Clean up any water that spills on the floor as soon as it happens. Remove soap buildup in the tub or shower  regularly. Attach bath mats securely with double-sided non-slip rug tape. Do not have throw rugs and other things on the floor that can make you trip. What can I do in the bedroom? Use night lights. Make sure that you have a light by your bed that is easy to reach. Do not use any sheets or blankets that are too big for your bed. They should not hang down onto the floor. Have a firm chair that has side arms. You can use this for support while you get dressed. Do not have throw rugs and other things on the floor that can make you trip. What can I do in the kitchen? Clean up any spills right away. Avoid walking on wet floors. Keep items that you use a lot in easy-to-reach places. If you need to reach something above you, use a strong step stool that has a grab bar. Keep electrical cords out of the way. Do not use floor polish or wax that makes floors slippery. If you must use wax, use non-skid floor wax. Do not have throw rugs and other things on the floor that can make you trip. What can I do with my stairs? Do not leave any items on the stairs. Make sure that there are handrails on both sides of the stairs and use them. Fix handrails that are broken or loose. Make sure that handrails are as long as the stairways. Check any carpeting to make sure that it is firmly attached to the stairs. Fix any carpet that is loose or worn. Avoid having throw rugs at the top or bottom of the stairs. If you do have throw rugs, attach them to the floor with carpet tape. Make sure that you have a light switch at the top of the stairs and the bottom of the stairs. If you do not have them, ask someone to add them for you. What else can I do to help prevent falls? Wear shoes that: Do not have high heels. Have rubber bottoms. Are comfortable and fit you well. Are closed at the toe. Do not wear sandals. If you use a stepladder: Make sure that it is fully opened. Do not climb a closed stepladder. Make sure that  both sides of the stepladder are locked into place. Ask someone to hold it for you, if possible. Clearly mark and make sure that you can see: Any grab bars or handrails. First and last steps. Where the edge of each step is. Use tools that help you move around (mobility aids) if they are needed. These include: Canes. Walkers. Scooters. Crutches. Turn on the lights when you go into a dark area. Replace any light bulbs as soon as they burn out. Set up your  furniture so you have a clear path. Avoid moving your furniture around. If any of your floors are uneven, fix them. If there are any pets around you, be aware of where they are. Review your medicines with your doctor. Some medicines can make you feel dizzy. This can increase your chance of falling. Ask your doctor what other things that you can do to help prevent falls. This information is not intended to replace advice given to you by your health care provider. Make sure you discuss any questions you have with your health care provider. Document Released: 06/03/2009 Document Revised: 01/13/2016 Document Reviewed: 09/11/2014 Elsevier Interactive Patient Education  2017 [employer].

## 2022-10-11 ENCOUNTER — Ambulatory Visit
Admission: RE | Admit: 2022-10-11 | Discharge: 2022-10-11 | Disposition: A | Discharge status: Home / Self Care | Payer: Medicare Other | Source: Ambulatory Visit | Attending: Acute Care | Admitting: Acute Care

## 2022-10-11 DIAGNOSIS — Z87891 Personal history of nicotine dependence: Secondary | ICD-10-CM | POA: Diagnosis not present

## 2022-10-11 DIAGNOSIS — Z122 Encounter for screening for malignant neoplasm of respiratory organs: Secondary | ICD-10-CM

## 2022-10-11 DIAGNOSIS — F17210 Nicotine dependence, cigarettes, uncomplicated: Secondary | ICD-10-CM

## 2022-10-12 ENCOUNTER — Other Ambulatory Visit: Payer: Self-pay | Admitting: Acute Care

## 2022-10-12 ENCOUNTER — Ambulatory Visit: Payer: Medicaid Other

## 2022-10-12 DIAGNOSIS — Z87891 Personal history of nicotine dependence: Secondary | ICD-10-CM

## 2022-10-12 DIAGNOSIS — F17210 Nicotine dependence, cigarettes, uncomplicated: Secondary | ICD-10-CM

## 2022-10-12 DIAGNOSIS — Z122 Encounter for screening for malignant neoplasm of respiratory organs: Secondary | ICD-10-CM

## 2022-10-17 ENCOUNTER — Telehealth: Payer: Self-pay | Admitting: Nurse Practitioner

## 2022-10-17 NOTE — Telephone Encounter (Signed)
CT results have not been resulted by PCP as of right now.

## 2022-10-17 NOTE — Telephone Encounter (Signed)
Copied from [place] [id number]. Topic: General - Other >> Oct 17, 2022 10:41 AM Sabas Sous wrote: Reason for CRM: Pt wants to discuss lung screening results with PCP, please advise

## 2022-10-18 NOTE — Telephone Encounter (Signed)
Called patient to inform her that CT results were not resulted  as of yet Patient expressed understanding.  Patient would like a call when they have been  resulted.

## 2022-10-22 NOTE — Patient Instructions (Signed)

## 2022-10-24 ENCOUNTER — Ambulatory Visit: Payer: Medicare Other | Admitting: Nurse Practitioner

## 2022-10-24 DIAGNOSIS — K5909 Other constipation: Secondary | ICD-10-CM | POA: Diagnosis not present

## 2022-10-24 DIAGNOSIS — K219 Gastro-esophageal reflux disease without esophagitis: Secondary | ICD-10-CM | POA: Diagnosis not present

## 2022-10-24 DIAGNOSIS — R1319 Other dysphagia: Secondary | ICD-10-CM | POA: Diagnosis not present

## 2022-10-24 DIAGNOSIS — R0789 Other chest pain: Secondary | ICD-10-CM | POA: Diagnosis not present

## 2022-10-24 DIAGNOSIS — R110 Nausea: Secondary | ICD-10-CM | POA: Diagnosis not present

## 2022-10-25 ENCOUNTER — Encounter: Payer: Self-pay | Admitting: Nurse Practitioner

## 2022-10-25 ENCOUNTER — Ambulatory Visit (INDEPENDENT_AMBULATORY_CARE_PROVIDER_SITE_OTHER): Payer: Medicare Other | Admitting: Nurse Practitioner

## 2022-10-25 VITALS — BP 139/78 | HR 55 | Temp 97.8°F | Ht 60.98 in | Wt 147.9 lb

## 2022-10-25 DIAGNOSIS — B9689 Other specified bacterial agents as the cause of diseases classified elsewhere: Secondary | ICD-10-CM

## 2022-10-25 DIAGNOSIS — N760 Acute vaginitis: Secondary | ICD-10-CM

## 2022-10-25 LAB — URINALYSIS, ROUTINE W REFLEX MICROSCOPIC
Bilirubin, UA: NEGATIVE
Glucose, UA: NEGATIVE
Ketones, UA: NEGATIVE
Leukocytes,UA: NEGATIVE
Nitrite, UA: NEGATIVE
Protein,UA: NEGATIVE
RBC, UA: NEGATIVE
Specific Gravity, UA: 1.015 (ref 1.005–1.030)
Urobilinogen, Ur: 1 mg/dL (ref 0.2–1.0)
pH, UA: 7.5 (ref 5.0–7.5)

## 2022-10-25 LAB — WET PREP FOR TRICH, YEAST, CLUE
Clue Cell Exam: POSITIVE — AB
Trichomonas Exam: NEGATIVE
Yeast Exam: NEGATIVE

## 2022-10-25 MED ORDER — CLINDAMYCIN PHOSPHATE 2 % VA CREA: TOPICAL_CREAM | VAGINAL | 5 refills | Status: DC

## 2022-10-25 NOTE — Progress Notes (Signed)
BP 139/78   Pulse (!) 55   Temp 97.8 F (36.6 C) (Oral)   Ht 5' 0.98" (1.549 m)   Wt 147 lb 14.4 oz (67.1 kg)   SpO2 99%   BMI 27.96 kg/m    Subjective:    Patient ID: Lindsey Bautista, female    DOB: Mar 05, 1961, 62 y.o.   MRN: [id number]  HPI: Lindsey Bautista is a 62 y.o. female  Chief Complaint  Patient presents with   BV    Patient here for f/u, patient states that she is about the same   [place] History of recurrent BV in past, recent treatment with Clindamycin on 09/20/22 and 10/04/22 -- she reports some symptoms remain.  She is followed by GI for chronic abdominal pain issues, last visit 10/24/22 with start Omeprazole and Pepcid, they plan on barium swallow testing and if stable next step would be manometry.    She reports some improvement in symptoms, but some ongoing odor and discharge.  Used vaginal estrace in past, but this did not offer benefit.  Previously treated with Cleocin vaginal cream which she tolerated. Duration: weeks Discharge description: mucous , white Pruritus: no Dysuria: no Malodorous: yes Urinary frequency: no Fevers: no Abdominal pain: no Sexual activity: not sexually active History of sexually transmitted diseases: no Recent antibiotic use: no Context: history of BV Treatments attempted: none   Relevant past medical, surgical, family and social history reviewed and updated as indicated. Interim medical history since our last visit reviewed. Allergies and medications reviewed and updated.  Review of Systems  Constitutional:  Negative for activity change, appetite change, diaphoresis, fatigue and fever.  Respiratory: Negative.    Cardiovascular: Negative.   Gastrointestinal: Negative.   Genitourinary:  Positive for vaginal discharge. Negative for vaginal bleeding and vaginal pain.  Neurological: Negative.   Psychiatric/Behavioral: Negative.      Per HPI unless specifically indicated above     Objective:    BP  139/78   Pulse (!) 55   Temp 97.8 F (36.6 C) (Oral)   Ht 5' 0.98" (1.549 m)   Wt 147 lb 14.4 oz (67.1 kg)   SpO2 99%   BMI 27.96 kg/m   Wt Readings from Last 3 Encounters:  10/25/22 147 lb 14.4 oz (67.1 kg)  10/09/22 149 lb (67.6 kg)  10/04/22 149 lb 4.8 oz (67.7 kg)    Physical Exam Vitals and nursing note reviewed.  Constitutional:      General: She is awake. She is not in acute distress.    Appearance: She is well-developed, well-groomed and overweight. She is not ill-appearing.  HENT:     Head: Normocephalic.     Right Ear: Hearing normal.     Left Ear: Hearing normal.  Eyes:     General: Lids are normal.        Right eye: No discharge.        Left eye: No discharge.     Conjunctiva/sclera: Conjunctivae normal.     Pupils: Pupils are equal, round, and reactive to light.  Neck:     Thyroid: No thyromegaly.     Vascular: No carotid bruit.  Cardiovascular:     Rate and Rhythm: Normal rate and regular rhythm.     Heart sounds: Normal heart sounds. No murmur heard.    No gallop.  Pulmonary:     Effort: Pulmonary effort is normal. No accessory muscle usage or respiratory distress.     Breath sounds: Normal  breath sounds.  Abdominal:     General: Bowel sounds are normal. There is no distension.     Palpations: Abdomen is soft.     Tenderness: There is no abdominal tenderness. There is no right CVA tenderness, left CVA tenderness, guarding or rebound.     Hernia: No hernia is present.  Musculoskeletal:     Cervical back: Normal range of motion and neck supple.     Right lower leg: No edema.     Left lower leg: No edema.  Skin:    General: Skin is warm and dry.  Neurological:     Mental Status: She is alert and oriented to person, place, and time.  Psychiatric:        Attention and Perception: Attention normal.        Mood and Affect: Mood normal.        Behavior: Behavior normal. Behavior is cooperative.        Thought Content: Thought content normal.         Judgment: Judgment normal.    Results for orders placed or performed in visit on 09/20/22  WET PREP FOR [place], YEAST, CLUE   Specimen: Urine   Urine  Result Value Ref Range   Trichomonas Exam Negative Negative   Yeast Exam Negative Negative   Clue Cell Exam Positive (A) Negative  Urinalysis, Routine w reflex microscopic  Result Value Ref Range   Specific Gravity, UA 1.020 1.005 - 1.030   pH, UA 5.5 5.0 - 7.5   Color, UA Yellow Yellow   Appearance Ur Clear Clear   Leukocytes,UA Negative Negative   Protein,UA Trace (A) Negative/Trace   Glucose, UA Negative Negative   Ketones, UA Trace (A) Negative   RBC, UA Negative Negative   Bilirubin, UA Negative Negative   Urobilinogen, Ur 0.2 0.2 - 1.0 mg/dL   Nitrite, UA Negative Negative   Microscopic Examination Comment       Assessment & Plan:   Problem List Items Addressed This Visit       Genitourinary   Bacterial vaginosis - Primary    Recurrence present.  Recent BV infection x 2 and today wet prep continues to be = for clue cells and has symptoms.  Prior had not had infection since January 2023.  At this time restart Clindamycin gel, which she has tolerated in past without ADR, but schedule for 2 times a week for the next 6 months to see if we can prevent recurrence.  Recommend she continue estrace daily, as suspect vaginal atrophy contributing to infections.  Educated her on this.  Consider GYN referral in future.      Relevant Orders   WET PREP FOR TRICH, YEAST, CLUE   Urinalysis, Routine w reflex microscopic     Follow up plan: Return for as scheduled April 12th.

## 2022-10-25 NOTE — Assessment & Plan Note (Signed)
Recurrence present.  Recent BV infection x 2 and today wet prep continues to be = for clue cells and has symptoms.  Prior had not had infection since January 2023.  At this time restart Clindamycin gel, which she has tolerated in past without ADR, but schedule for 2 times a week for the next 6 months to see if we can prevent recurrence.  Recommend she continue estrace daily, as suspect vaginal atrophy contributing to infections.  Educated her on this.  Consider GYN referral in future.

## 2022-10-27 ENCOUNTER — Other Ambulatory Visit: Payer: Self-pay | Admitting: Gastroenterology

## 2022-10-27 DIAGNOSIS — K219 Gastro-esophageal reflux disease without esophagitis: Secondary | ICD-10-CM

## 2022-10-27 DIAGNOSIS — R1319 Other dysphagia: Secondary | ICD-10-CM

## 2022-10-27 DIAGNOSIS — R110 Nausea: Secondary | ICD-10-CM

## 2022-10-27 DIAGNOSIS — R0789 Other chest pain: Secondary | ICD-10-CM

## 2022-10-30 ENCOUNTER — Ambulatory Visit
Admission: RE | Admit: 2022-10-30 | Discharge: 2022-10-30 | Disposition: A | Discharge status: Home / Self Care | Payer: Medicare Other | Source: Ambulatory Visit | Attending: Gastroenterology | Admitting: Gastroenterology

## 2022-10-30 DIAGNOSIS — R1319 Other dysphagia: Secondary | ICD-10-CM | POA: Diagnosis not present

## 2022-10-30 DIAGNOSIS — K219 Gastro-esophageal reflux disease without esophagitis: Secondary | ICD-10-CM

## 2022-10-30 DIAGNOSIS — R110 Nausea: Secondary | ICD-10-CM

## 2022-10-30 DIAGNOSIS — K449 Diaphragmatic hernia without obstruction or gangrene: Secondary | ICD-10-CM | POA: Diagnosis not present

## 2022-10-30 DIAGNOSIS — R0789 Other chest pain: Secondary | ICD-10-CM

## 2022-11-01 ENCOUNTER — Other Ambulatory Visit: Payer: Self-pay

## 2022-11-01 ENCOUNTER — Encounter: Payer: Self-pay | Admitting: Intensive Care

## 2022-11-01 ENCOUNTER — Emergency Department
Admission: EM | Admit: 2022-11-01 | Discharge: 2022-11-01 | Disposition: A | Discharge status: Home / Self Care | Payer: Medicare Other | Attending: Emergency Medicine | Admitting: Emergency Medicine

## 2022-11-01 ENCOUNTER — Emergency Department: Payer: Medicare Other

## 2022-11-01 DIAGNOSIS — I700 Atherosclerosis of aorta: Secondary | ICD-10-CM | POA: Diagnosis not present

## 2022-11-01 DIAGNOSIS — R109 Unspecified abdominal pain: Secondary | ICD-10-CM

## 2022-11-01 DIAGNOSIS — R110 Nausea: Secondary | ICD-10-CM | POA: Insufficient documentation

## 2022-11-01 DIAGNOSIS — R1030 Lower abdominal pain, unspecified: Secondary | ICD-10-CM | POA: Insufficient documentation

## 2022-11-01 LAB — URINALYSIS, ROUTINE W REFLEX MICROSCOPIC
Bilirubin Urine: NEGATIVE
Glucose, UA: NEGATIVE mg/dL
Ketones, ur: NEGATIVE mg/dL
Leukocytes,Ua: NEGATIVE
Nitrite: NEGATIVE
Protein, ur: NEGATIVE mg/dL
Specific Gravity, Urine: 1.017 (ref 1.005–1.030)
pH: 5 (ref 5.0–8.0)

## 2022-11-01 LAB — CBC
HCT: 42.7 % (ref 36.0–46.0)
Hemoglobin: 14 g/dL (ref 12.0–15.0)
MCH: 29.2 pg (ref 26.0–34.0)
MCHC: 32.8 g/dL (ref 30.0–36.0)
MCV: 89 fL (ref 80.0–100.0)
Platelets: 266 10*3/uL (ref 150–400)
RBC: 4.8 MIL/uL (ref 3.87–5.11)
RDW: 12.6 % (ref 11.5–15.5)
WBC: 5.9 10*3/uL (ref 4.0–10.5)
nRBC: 0 % (ref 0.0–0.2)

## 2022-11-01 LAB — COMPREHENSIVE METABOLIC PANEL
ALT: 19 U/L (ref 0–44)
AST: 25 U/L (ref 15–41)
Albumin: 4.3 g/dL (ref 3.5–5.0)
Alkaline Phosphatase: 80 U/L (ref 38–126)
Anion gap: 9 (ref 5–15)
BUN: 10 mg/dL (ref 8–23)
CO2: 28 mmol/L (ref 22–32)
Calcium: 8.9 mg/dL (ref 8.9–10.3)
Chloride: 103 mmol/L (ref 98–111)
Creatinine, Ser: 0.92 mg/dL (ref 0.44–1.00)
GFR, Estimated: >60 mL/min (ref >60)
Glucose, Bld: 100 mg/dL — ABNORMAL HIGH (ref 70–99)
Potassium: 3.9 mmol/L (ref 3.5–5.1)
Sodium: 140 mmol/L (ref 135–145)
Total Bilirubin: 0.7 mg/dL (ref 0.3–1.2)
Total Protein: 7.6 g/dL (ref 6.5–8.1)

## 2022-11-01 LAB — LIPASE, BLOOD: Lipase: 43 U/L (ref 11–51)

## 2022-11-01 MED ORDER — NITROFURANTOIN MONOHYD MACRO 100 MG PO CAPS: 100.0000 mg | ORAL_CAPSULE | Freq: Two times a day (BID) | ORAL | 0 refills | Status: AC

## 2022-11-01 MED ORDER — PHENAZOPYRIDINE HCL 100 MG PO TABS: 100.0000 mg | ORAL_TABLET | Freq: Three times a day (TID) | ORAL | 0 refills | Status: DC | PRN

## 2022-11-01 MED ORDER — OXYCODONE-ACETAMINOPHEN 5-325 MG PO TABS
1.0000 | ORAL_TABLET | ORAL | Status: AC | PRN
  Administered 2022-11-01 (×2): 1 via ORAL
  Filled 2022-11-01 (×2): qty 1

## 2022-11-01 NOTE — ED Triage Notes (Signed)
Patient c/o lower abdominal pain and bloating with nausea.   Recently diagnosed with hernia in her esophagus.

## 2022-11-01 NOTE — Discharge Instructions (Signed)
Please seek medical attention for any high fevers, chest pain, shortness of breath, change in behavior, persistent vomiting, bloody stool or any other new or concerning symptoms.  

## 2022-11-01 NOTE — ED Notes (Signed)
Pt A&O x4, no obvious distress noted, respirations regular/unlabored. Pt verbalizes understanding of discharge instructions. Pt able to ambulate from ED independently.   

## 2022-11-02 NOTE — ED Provider Notes (Signed)
[facility] Provider Note    Event Date/Time   First MD Initiated Contact with Patient 11/01/22 2108     (approximate)   History   Abdominal Pain   HPI  Lindsey Bautista is a 62 y.o. female  who presents to the emergency department today because of concern for abdominal pain.  Patient states that the abdominal pain started yesterday.  Is located in the suprapubic region.  She also feels a sense of bloating to that area.  Denies any change in her bowel movements.  No change in urination.  Patient does state that she has had some nausea although she has had this for some time. States she has seen her PCP for it.       Physical Exam   Triage Vital Signs: ED Triage Vitals [11/01/22 1852]  Enc Vitals Group     BP (!) 148/74     Pulse Rate 60     Resp 18     Temp 97.8 F (36.6 C)     Temp Source Oral     SpO2 97 %     Weight 149 lb (67.6 kg)     Height '5\' 2"'$  (1.575 m)     Head Circumference      Peak Flow      Pain Score 10     Pain Loc      Pain Edu?      Excl. in [place]?     Most recent vital signs: Vitals:   11/01/22 2110 11/01/22 2300  BP: (!) 155/71 131/69  Pulse: (!) 57 (!) 51  Resp: 11 14  Temp: 98.2 F (36.8 C)   SpO2: 100% 97%   General: Awake, alert, oriented. CV:  Good peripheral perfusion. Bradycardia. Resp:  Normal effort. Lungs clear. Abd:  No distention. Non tender.   ED Results / Procedures / Treatments   Labs (all labs ordered are listed, but only abnormal results are displayed) Labs Reviewed  COMPREHENSIVE METABOLIC PANEL - Abnormal; Notable for the following components:      Result Value   Glucose, Bld 100 (*)    All other components within normal limits  URINALYSIS, ROUTINE W REFLEX MICROSCOPIC - Abnormal; Notable for the following components:   Color, Urine AMBER (*)    APPearance TURBID (*)    Hgb urine dipstick SMALL (*)    Bacteria, UA MANY (*)    All other components within normal limits  LIPASE, BLOOD   CBC     EKG  None   RADIOLOGY I independently interpreted and visualized the CT abd/pel. My interpretation: artifact, no obvious free air Radiology interpretation:  IMPRESSION:  1. Limited study due to extensive streak artifact from retained  barium.  2. No acute intra-abdominal or intrapelvic process.  3.  Aortic Atherosclerosis (ICD10-I70.0).      PROCEDURES:  Critical Care performed: No    MEDICATIONS ORDERED IN ED: Medications  oxyCODONE-acetaminophen (PERCOCET/ROXICET) 5-325 MG per tablet 1 tablet (1 tablet Oral Given 11/01/22 2141)     IMPRESSION / MDM / ASSESSMENT AND PLAN / ED COURSE  I reviewed the triage vital signs and the nursing notes.                              Differential diagnosis includes, but is not limited to, UTI, kidney stone, diverticulitis  Patient's presentation is most consistent with acute presentation with potential threat to life or bodily  function.   Patient presented to the emergency department today because of concern for abdominal pain. No tenderness on exam. Blood work without concerning leukocytosis. Did obtain a ct scan which did not show any concerning acute abnormalities. UA did have findings concerning for possible UTI. Given patient's description of pain I do have concern for UTI. Will plan on discharging with antibiotics.  FINAL CLINICAL IMPRESSION(S) / ED DIAGNOSES   Final diagnoses:  Abdominal pain, unspecified abdominal location      Note:  This document was prepared using Dragon voice recognition software and may include unintentional dictation errors.    Nance Pear, MD 11/02/22 [phone]

## 2022-11-04 ENCOUNTER — Encounter: Payer: Self-pay | Admitting: Emergency Medicine

## 2022-11-04 ENCOUNTER — Emergency Department: Payer: Medicare Other

## 2022-11-04 ENCOUNTER — Other Ambulatory Visit: Payer: Self-pay

## 2022-11-04 ENCOUNTER — Observation Stay
Admission: EM | Admit: 2022-11-04 | Discharge: 2022-11-05 | Disposition: A | Discharge status: Home / Self Care | Payer: Medicare Other | Attending: Internal Medicine | Admitting: Internal Medicine

## 2022-11-04 DIAGNOSIS — R0781 Pleurodynia: Secondary | ICD-10-CM | POA: Diagnosis present

## 2022-11-04 DIAGNOSIS — I959 Hypotension, unspecified: Secondary | ICD-10-CM | POA: Diagnosis not present

## 2022-11-04 DIAGNOSIS — R509 Fever, unspecified: Secondary | ICD-10-CM | POA: Diagnosis not present

## 2022-11-04 DIAGNOSIS — J9601 Acute respiratory failure with hypoxia: Principal | ICD-10-CM | POA: Insufficient documentation

## 2022-11-04 DIAGNOSIS — R079 Chest pain, unspecified: Secondary | ICD-10-CM | POA: Diagnosis not present

## 2022-11-04 DIAGNOSIS — Z20822 Contact with and (suspected) exposure to covid-19: Secondary | ICD-10-CM | POA: Insufficient documentation

## 2022-11-04 DIAGNOSIS — R0600 Dyspnea, unspecified: Secondary | ICD-10-CM | POA: Diagnosis not present

## 2022-11-04 DIAGNOSIS — I2510 Atherosclerotic heart disease of native coronary artery without angina pectoris: Secondary | ICD-10-CM | POA: Insufficient documentation

## 2022-11-04 DIAGNOSIS — I10 Essential (primary) hypertension: Secondary | ICD-10-CM | POA: Diagnosis not present

## 2022-11-04 DIAGNOSIS — I5032 Chronic diastolic (congestive) heart failure: Secondary | ICD-10-CM | POA: Insufficient documentation

## 2022-11-04 DIAGNOSIS — R051 Acute cough: Secondary | ICD-10-CM

## 2022-11-04 DIAGNOSIS — R110 Nausea: Secondary | ICD-10-CM | POA: Diagnosis not present

## 2022-11-04 DIAGNOSIS — F17210 Nicotine dependence, cigarettes, uncomplicated: Secondary | ICD-10-CM | POA: Diagnosis not present

## 2022-11-04 DIAGNOSIS — Z79899 Other long term (current) drug therapy: Secondary | ICD-10-CM | POA: Diagnosis not present

## 2022-11-04 DIAGNOSIS — R0789 Other chest pain: Secondary | ICD-10-CM | POA: Diagnosis not present

## 2022-11-04 DIAGNOSIS — J432 Centrilobular emphysema: Secondary | ICD-10-CM | POA: Insufficient documentation

## 2022-11-04 DIAGNOSIS — J449 Chronic obstructive pulmonary disease, unspecified: Secondary | ICD-10-CM | POA: Diagnosis not present

## 2022-11-04 DIAGNOSIS — J988 Other specified respiratory disorders: Secondary | ICD-10-CM | POA: Diagnosis not present

## 2022-11-04 DIAGNOSIS — F419 Anxiety disorder, unspecified: Secondary | ICD-10-CM | POA: Diagnosis present

## 2022-11-04 DIAGNOSIS — Z8673 Personal history of transient ischemic attack (TIA), and cerebral infarction without residual deficits: Secondary | ICD-10-CM | POA: Insufficient documentation

## 2022-11-04 DIAGNOSIS — R0902 Hypoxemia: Secondary | ICD-10-CM | POA: Diagnosis not present

## 2022-11-04 DIAGNOSIS — Z7982 Long term (current) use of aspirin: Secondary | ICD-10-CM | POA: Diagnosis not present

## 2022-11-04 LAB — URINALYSIS, ROUTINE W REFLEX MICROSCOPIC
Bacteria, UA: NONE SEEN
Bilirubin Urine: NEGATIVE
Glucose, UA: NEGATIVE mg/dL
Ketones, ur: NEGATIVE mg/dL
Leukocytes,Ua: NEGATIVE
Nitrite: NEGATIVE
Protein, ur: NEGATIVE mg/dL
Specific Gravity, Urine: 1.012 (ref 1.005–1.030)
pH: 6 (ref 5.0–8.0)

## 2022-11-04 LAB — RESP PANEL BY RT-PCR (RSV, FLU A&B, COVID)  RVPGX2
Influenza A by PCR: NEGATIVE
Influenza B by PCR: NEGATIVE
Resp Syncytial Virus by PCR: NEGATIVE
SARS Coronavirus 2 by RT PCR: NEGATIVE

## 2022-11-04 LAB — CBC
HCT: 40.4 % (ref 36.0–46.0)
HCT: 37.6 % (ref 36.0–46.0)
Hemoglobin: 13 g/dL (ref 12.0–15.0)
Hemoglobin: 12.1 g/dL (ref 12.0–15.0)
MCH: 28.8 pg (ref 26.0–34.0)
MCH: 28.7 pg (ref 26.0–34.0)
MCHC: 32.2 g/dL (ref 30.0–36.0)
MCHC: 32.2 g/dL (ref 30.0–36.0)
MCV: 89.6 fL (ref 80.0–100.0)
MCV: 89.3 fL (ref 80.0–100.0)
Platelets: 221 10*3/uL (ref 150–400)
Platelets: 203 10*3/uL (ref 150–400)
RBC: 4.51 MIL/uL (ref 3.87–5.11)
RBC: 4.21 MIL/uL (ref 3.87–5.11)
RDW: 12.7 % (ref 11.5–15.5)
RDW: 12.7 % (ref 11.5–15.5)
WBC: 7.8 10*3/uL (ref 4.0–10.5)
WBC: 8.3 10*3/uL (ref 4.0–10.5)
nRBC: 0 % (ref 0.0–0.2)
nRBC: 0 % (ref 0.0–0.2)

## 2022-11-04 LAB — BASIC METABOLIC PANEL
Anion gap: 6 (ref 5–15)
BUN: 10 mg/dL (ref 8–23)
CO2: 27 mmol/L (ref 22–32)
Calcium: 8.9 mg/dL (ref 8.9–10.3)
Chloride: 104 mmol/L (ref 98–111)
Creatinine, Ser: 0.91 mg/dL (ref 0.44–1.00)
GFR, Estimated: >60 mL/min (ref >60)
Glucose, Bld: 120 mg/dL — ABNORMAL HIGH (ref 70–99)
Potassium: 3.4 mmol/L — ABNORMAL LOW (ref 3.5–5.1)
Sodium: 137 mmol/L (ref 135–145)

## 2022-11-04 LAB — CREATININE, SERUM
Creatinine, Ser: 0.9 mg/dL (ref 0.44–1.00)
GFR, Estimated: >60 mL/min (ref >60)

## 2022-11-04 LAB — LACTIC ACID, PLASMA
Lactic Acid, Venous: 0.9 mmol/L (ref 0.5–1.9)
Lactic Acid, Venous: 1.3 mmol/L (ref 0.5–1.9)

## 2022-11-04 LAB — TROPONIN I (HIGH SENSITIVITY)
Troponin I (High Sensitivity): 3 ng/L (ref <18)
Troponin I (High Sensitivity): 5 ng/L (ref <18)

## 2022-11-04 LAB — D-DIMER, QUANTITATIVE: D-Dimer, Quant: 0.28 ug/mL-FEU (ref 0.00–0.50)

## 2022-11-04 LAB — BRAIN NATRIURETIC PEPTIDE: B Natriuretic Peptide: 20.2 pg/mL (ref 0.0–100.0)

## 2022-11-04 LAB — PROCALCITONIN: Procalcitonin: <0.1 ng/mL

## 2022-11-04 LAB — HIV ANTIBODY (ROUTINE TESTING W REFLEX): HIV Screen 4th Generation wRfx: NONREACTIVE

## 2022-11-04 MED ORDER — SODIUM CHLORIDE 0.9 % IV BOLUS (SEPSIS)
1000.0000 mL | Freq: Once | INTRAVENOUS | Status: AC
  Administered 2022-11-04: 1000 mL via INTRAVENOUS

## 2022-11-04 MED ORDER — ONDANSETRON HCL 4 MG/2ML IJ SOLN
4.0000 mg | Freq: Four times a day (QID) | INTRAMUSCULAR | Status: DC | PRN
  Administered 2022-11-04 (×2): 4 mg via INTRAVENOUS
  Filled 2022-11-04 (×2): qty 2

## 2022-11-04 MED ORDER — ENOXAPARIN SODIUM 40 MG/0.4ML IJ SOSY
40.0000 mg | PREFILLED_SYRINGE | INTRAMUSCULAR | Status: DC
  Administered 2022-11-04: 40 mg via SUBCUTANEOUS
  Filled 2022-11-04 (×2): qty 0.4

## 2022-11-04 MED ORDER — SODIUM CHLORIDE 0.9 % IV SOLN
100.0000 mg | Freq: Once | INTRAVENOUS | Status: AC
  Administered 2022-11-04: 100 mg via INTRAVENOUS
  Filled 2022-11-04: qty 100

## 2022-11-04 MED ORDER — NITROGLYCERIN 0.4 MG SL SUBL: 0.4000 mg | SUBLINGUAL_TABLET | SUBLINGUAL | Status: DC | PRN

## 2022-11-04 MED ORDER — ASPIRIN 81 MG PO TBEC
81.0000 mg | DELAYED_RELEASE_TABLET | Freq: Every day | ORAL | Status: DC
  Administered 2022-11-04 – 2022-11-05 (×2): 81 mg via ORAL
  Filled 2022-11-04 (×2): qty 1

## 2022-11-04 MED ORDER — CLONAZEPAM 0.5 MG PO TABS
1.0000 mg | ORAL_TABLET | Freq: Two times a day (BID) | ORAL | Status: DC
  Administered 2022-11-04 – 2022-11-05 (×3): 1 mg via ORAL
  Filled 2022-11-04 (×3): qty 2

## 2022-11-04 MED ORDER — ACETAMINOPHEN 500 MG PO TABS
1000.0000 mg | ORAL_TABLET | Freq: Once | ORAL | Status: AC
  Administered 2022-11-04: 1000 mg via ORAL
  Filled 2022-11-04: qty 2

## 2022-11-04 MED ORDER — EZETIMIBE 10 MG PO TABS
10.0000 mg | ORAL_TABLET | Freq: Every day | ORAL | Status: DC
  Administered 2022-11-04 – 2022-11-05 (×2): 10 mg via ORAL
  Filled 2022-11-04 (×2): qty 1

## 2022-11-04 MED ORDER — ACETAMINOPHEN 325 MG PO TABS: 650.0000 mg | ORAL_TABLET | Freq: Four times a day (QID) | ORAL | Status: DC | PRN

## 2022-11-04 MED ORDER — FLUOXETINE HCL 20 MG PO CAPS
40.0000 mg | ORAL_CAPSULE | Freq: Every day | ORAL | Status: DC
  Administered 2022-11-04 – 2022-11-05 (×2): 40 mg via ORAL
  Filled 2022-11-04 (×2): qty 2

## 2022-11-04 MED ORDER — IPRATROPIUM-ALBUTEROL 0.5-2.5 (3) MG/3ML IN SOLN: 3.0000 mL | RESPIRATORY_TRACT | Status: DC | PRN

## 2022-11-04 MED ORDER — IBUPROFEN 400 MG PO TABS
400.0000 mg | ORAL_TABLET | Freq: Once | ORAL | Status: AC
  Administered 2022-11-04: 400 mg via ORAL
  Filled 2022-11-04: qty 1

## 2022-11-04 MED ORDER — ONDANSETRON HCL 4 MG PO TABS: 4.0000 mg | ORAL_TABLET | Freq: Four times a day (QID) | ORAL | Status: DC | PRN

## 2022-11-04 MED ORDER — ALBUTEROL SULFATE (2.5 MG/3ML) 0.083% IN NEBU
5.0000 mg | INHALATION_SOLUTION | Freq: Once | RESPIRATORY_TRACT | Status: AC
  Administered 2022-11-04: 5 mg via RESPIRATORY_TRACT
  Filled 2022-11-04 (×2): qty 6

## 2022-11-04 MED ORDER — IPRATROPIUM BROMIDE 0.02 % IN SOLN
0.5000 mg | Freq: Once | RESPIRATORY_TRACT | Status: AC
  Administered 2022-11-04: 0.5 mg via RESPIRATORY_TRACT
  Filled 2022-11-04 (×2): qty 2.5

## 2022-11-04 MED ORDER — GUAIFENESIN-DM 100-10 MG/5ML PO SYRP: 5.0000 mL | ORAL_SOLUTION | ORAL | Status: DC | PRN

## 2022-11-04 MED ORDER — ATORVASTATIN CALCIUM 20 MG PO TABS
80.0000 mg | ORAL_TABLET | Freq: Every day | ORAL | Status: DC
  Administered 2022-11-04 – 2022-11-05 (×2): 80 mg via ORAL
  Filled 2022-11-04 (×2): qty 4

## 2022-11-04 MED ORDER — ACETAMINOPHEN 650 MG RE SUPP: 650.0000 mg | Freq: Four times a day (QID) | RECTAL | Status: DC | PRN

## 2022-11-04 NOTE — ED Provider Notes (Signed)
[facility] Provider Note    Event Date/Time   First MD Initiated Contact with Patient 11/04/22 0122     (approximate)   History   Chest Pain   HPI  Lindsey Bautista is a 62 y.o. female with history of COPD, nonobstructive coronary artery disease, diastolic heart failure in 2018, CVA who presents to the emergency department complaints of pleuritic chest pain, shortness of breath, productive cough, chills tonight.  Oral temperature here of 99.6.  No calf tenderness or calf swelling.  Hypoxic here on room air.  Does not wear oxygen at home.   History provided by patient.    Past Medical History:  Diagnosis Date   Arthritis    Atypical chest pain    a. 08/2016 Neg MV.   COPD (chronic obstructive pulmonary disease) (HCC)    Coronary artery disease, non-occlusive    a.  LHC 06/19/2018: Mid LAD 50% with iFR 0.94   Depression    Diastolic dysfunction    a. 10/2016 Echo: EF 60-65%, no rwma, Gr1 DD, mild MR, nl LA size, nl RV fxn; b. 05/2018 Echo: EF 60-65%. No rwma. Mild MR. Nl RV fxn.   Fibromyalgia    GERD (gastroesophageal reflux disease)    Hypotension    IBS (irritable bowel syndrome)    Right arm weakness    Situational syncope    Stroke ([place])    2013, 2018.  Some short term memory issues, some weakness   TIA (transient ischemic attack)    Wears dentures    full upper    Past Surgical History:  Procedure Laterality Date   CATARACT EXTRACTION W/PHACO Right 05/04/2022   Procedure: CATARACT EXTRACTION PHACO AND INTRAOCULAR LENS PLACEMENT (IOC) RIGHT 3.89 00:47.4;  Surgeon: Norvel Richards, MD;  Location: [place];  Service: Ophthalmology;  Laterality: Right;   CATARACT EXTRACTION W/PHACO Left 05/18/2022   Procedure: CATARACT EXTRACTION PHACO AND INTRAOCULAR LENS PLACEMENT (Max) LEFT 1.44 00:19.7;  Surgeon: Norvel Richards, MD;  Location: [place];  Service: Ophthalmology;  Laterality: Left;   COLONOSCOPY WITH  PROPOFOL N/A 11/22/2015   Procedure: COLONOSCOPY WITH PROPOFOL;  Surgeon: Manya Silvas, MD;  Location[facility] ENDOSCOPY;  Service: Endoscopy;  Laterality: N/A;   ESOPHAGOGASTRODUODENOSCOPY (EGD) WITH PROPOFOL N/A 11/22/2015   Procedure: ESOPHAGOGASTRODUODENOSCOPY (EGD) WITH PROPOFOL;  Surgeon: Manya Silvas, MD;  Location: [facility] ENDOSCOPY;  Service: Endoscopy;  Laterality: N/A;   INTRAVASCULAR PRESSURE WIRE/FFR STUDY N/A 06/19/2018   Procedure: INTRAVASCULAR PRESSURE WIRE/FFR STUDY;  Surgeon: Nelva Bush, MD;  Location: [place] [facility];  Service: Cardiovascular;  Laterality: N/A;   LEFT HEART CATH AND CORONARY ANGIOGRAPHY N/A 06/19/2018   Procedure: LEFT HEART CATH AND CORONARY ANGIOGRAPHY;  Surgeon: Nelva Bush, MD;  Location: [place] [facility];  Service: Cardiovascular;  Laterality: N/A;   OOPHORECTOMY     TONSILLECTOMY     VAGINAL HYSTERECTOMY      MEDICATIONS:  Prior to Admission medications   Medication Sig Start Date End Date Taking? Authorizing Provider  aspirin 81 MG tablet Take 81 mg by mouth daily.    [provider]  atorvastatin (LIPITOR) 80 MG tablet Take 1 tablet by mouth once daily 01/09/22   [name], Henrine Screws T, NP  clindamycin (CLEOCIN) 2 % vaginal cream Apply one application twice a week to vaginal area for recurrent BV infections.  Do this regimen for the next 6 months. 10/25/22   [name], Barbaraann Faster, NP  clonazePAM (KLONOPIN) 1 MG tablet Take  1 tablet (1 mg total) by mouth 2 (two) times daily. 09/15/22   [name], Henrine Screws T, NP  dimenhyDRINATE (DRAMAMINE) 50 MG tablet Take 50 mg by mouth every 8 (eight) hours as needed.    [provider]  docusate sodium (COLACE) 100 MG capsule Take 100 mg by mouth daily.     [provider]  EPINEPHrine 0.3 mg/0.3 mL IJ SOAJ injection Inject 0.3 mg into the muscle as needed for anaphylaxis. 06/09/22   Marnee Guarneri T, NP  estradiol (ESTRACE) 0.5 MG tablet Take 1 tablet by mouth once daily 01/09/22    Marnee Guarneri T, NP  ezetimibe (ZETIA) 10 MG tablet Take 1 tablet by mouth once daily 01/09/22   [name], Henrine Screws T, NP  famotidine (PEPCID) 40 MG tablet Take 1 tablet by mouth at bedtime. 10/24/22   [provider]  FLUoxetine (PROZAC) 40 MG capsule Take 1 capsule by mouth once daily 01/09/22   [name], Henrine Screws T, NP  ibuprofen (ADVIL) 800 MG tablet Take 800 mg by mouth every 8 (eight) hours as needed.    [provider]  LINZESS 72 MCG capsule TAKE 1 CAPSULE BY MOUTH ONCE DAILY BEFORE BREAKFAST 01/09/22   [name], Jolene T, NP  nitrofurantoin, macrocrystal-monohydrate, (MACROBID) 100 MG capsule Take 1 capsule (100 mg total) by mouth 2 (two) times daily for 5 days. 11/01/22 11/06/22  Nance Pear, MD  nitroGLYCERIN (NITROSTAT) 0.4 MG SL tablet Place 1 tablet (0.4 mg total) under the tongue every 5 (five) minutes as needed for chest pain. 06/09/22   [name], Henrine Screws T, NP  omeprazole (PRILOSEC) 40 MG capsule Take by mouth. 10/24/22   [provider]  phenazopyridine (PYRIDIUM) 100 MG tablet Take 1 tablet (100 mg total) by mouth 3 (three) times daily as needed for pain. 11/01/22 11/01/23  Nance Pear, MD    Physical Exam   Triage Vital Signs: ED Triage Vitals  Enc Vitals Group     BP 11/04/22 0106 120/62     Pulse Rate 11/04/22 0106 93     Resp 11/04/22 0106 18     Temp 11/04/22 0106 99.6 F (37.6 C)     Temp Source 11/04/22 0106 Oral     SpO2 11/04/22 0106 (!) 88 %     Weight 11/04/22 0102 149 lb (67.6 kg)     Height 11/04/22 0102 5\' 2"  (1.575 m)     Head Circumference --      Peak Flow --      Pain Score --      Pain Loc --      Pain Edu? --      Excl. in [place]? --     Most recent vital signs: Vitals:   11/04/22 0215 11/04/22 0245  BP:    Pulse: 95 94  Resp: 18 15  Temp:    SpO2: 100% 100%    CONSTITUTIONAL: Alert, responds appropriately to questions. Well-appearing; well-nourished HEAD: Normocephalic, atraumatic EYES: Conjunctivae clear, pupils  appear equal, sclera nonicteric ENT: normal nose; moist mucous membranes NECK: Supple, normal ROM CARD: RRR; S1 and S2 appreciated RESP: Normal chest excursion without splinting or tachypnea; breath sounds clear and equal bilaterally; no wheezes, no rhonchi, no rales, hypoxic on room air ABD/GI: Non-distended; soft, non-tender, no rebound, no guarding, no peritoneal signs BACK: The back appears normal EXT: Normal ROM in all joints; no deformity noted, no edema, no calf tenderness or calf swelling SKIN: Normal color for age and race; warm; no rash on exposed skin NEURO:  Moves all extremities equally, normal speech PSYCH: The patient's mood and manner are appropriate.   ED Results / Procedures / Treatments   LABS: (all labs ordered are listed, but only abnormal results are displayed) Labs Reviewed  BASIC METABOLIC PANEL - Abnormal; Notable for the following components:      Result Value   Potassium 3.4 (*)    Glucose, Bld 120 (*)    All other components within normal limits  RESP PANEL BY RT-PCR (RSV, FLU A&B, COVID)  RVPGX2  CULTURE, BLOOD (ROUTINE X 2)  CULTURE, BLOOD (ROUTINE X 2)  URINE CULTURE  CBC  D-DIMER, QUANTITATIVE  PROCALCITONIN  LACTIC ACID, PLASMA  BRAIN NATRIURETIC PEPTIDE  URINALYSIS, ROUTINE W REFLEX MICROSCOPIC  LACTIC ACID, PLASMA  TROPONIN I (HIGH SENSITIVITY)  TROPONIN I (HIGH SENSITIVITY)     EKG:  EKG Interpretation  Date/Time:  Saturday November 04 2022 01:04:16 EDT Ventricular Rate:  97 PR Interval:  134 QRS Duration: 72 QT Interval:  356 QTC Calculation: 452 R Axis:   73 Text Interpretation: Normal sinus rhythm Normal ECG When compared with ECG of 17-Jul-2022 13:41, Vent. rate has increased BY  39 BPM Confirmed by Pryor Curia [phone]) on 11/04/2022 1:31:56 AM         RADIOLOGY: My personal review and interpretation of imaging: Chest x-ray clear.  I have personally reviewed all radiology reports.   DG Chest Portable 1 View  Result  Date: 11/04/2022 CLINICAL DATA:  Dyspnea, chest pain, hypoxia EXAM: PORTABLE CHEST 1 VIEW COMPARISON:  None Available. FINDINGS: The heart size and mediastinal contours are within normal limits. Both lungs are clear. The visualized skeletal structures are unremarkable. IMPRESSION: No active disease. Electronically Signed   By: Fidela Salisbury M.D.   On: 11/04/2022 01:41     PROCEDURES:  Critical Care performed: Yes, see critical care procedure note(s)   CRITICAL CARE Performed by: Cyril Mourning Hank Walling   Total critical care time: 45 minutes  Critical care time was exclusive of separately billable procedures and treating other patients.  Critical care was necessary to treat or prevent imminent or life-threatening deterioration.  Critical care was time spent personally by me on the following activities: development of treatment plan with patient and/or surrogate as well as nursing, discussions with consultants, evaluation of patient's response to treatment, examination of patient, obtaining history from patient or surrogate, ordering and performing treatments and interventions, ordering and review of laboratory studies, ordering and review of radiographic studies, pulse oximetry and re-evaluation of patient's condition.   [name]1-3 Lead EKG Interpretation  Performed by: Kei Langhorst, Delice Bison, DO Authorized by: Fe Okubo, Delice Bison, DO     Interpretation: normal     ECG rate:  92   ECG rate assessment: normal     Rhythm: sinus rhythm     Ectopy: none     Conduction: normal       IMPRESSION / MDM / ASSESSMENT AND PLAN / ED COURSE  I reviewed the triage vital signs and the nursing notes.    Patient here with complaints of pleuritic chest pain, shortness of breath, chills and productive cough.  Hypoxic on arrival here.  The patient is on the cardiac monitor to evaluate for evidence of arrhythmia and/or significant heart rate changes.   DIFFERENTIAL DIAGNOSIS (includes but not limited to):   Pneumonia,  early URI, pneumothorax, CHF, PE, ACS, UTI, sepsis   Patient's presentation is most consistent with acute presentation with potential threat to life or bodily function.   PLAN: Will obtain  CBC, BMP, BNP, troponin x 2, D-dimer, procalcitonin, lactic, blood cultures, urinalysis and urine culture, rectal temperature, chest x-ray.  Her lungs are clear to auscultation with good aeration currently.  I do not think this is a COPD exacerbation based on her exam.  Anticipate admission given new oxygen requirement.   MEDICATIONS GIVEN IN ED: Medications  doxycycline (VIBRAMYCIN) 100 mg in sodium chloride 0.9 % 250 mL IVPB (has no administration in time range)  sodium chloride 0.9 % bolus 1,000 mL (1,000 mLs Intravenous New Bag/Given 11/04/22 0143)  albuterol (PROVENTIL) (2.5 MG/3ML) 0.083% nebulizer solution 5 mg (5 mg Nebulization Given 11/04/22 0231)  ipratropium (ATROVENT) nebulizer solution 0.5 mg (0.5 mg Nebulization Given 11/04/22 0231)  acetaminophen (TYLENOL) tablet 1,000 mg (1,000 mg Oral Given 11/04/22 0224)     ED COURSE: Patient's labs show no leukocytosis or leukopenia.  Normal procalcitonin, lactic.  COVID, flu and RSV negative.  Chest x-ray reviewed and interpreted by myself and the radiologist and shows no acute abnormality but I am concerned clinically for pneumonia given her presentation.  Will give IV doxycycline given history of anaphylaxis to penicillins.  Cultures pending.  Urine also pending but no urinary symptoms.  Will discuss with hospitalist for admission given new onset hypoxia.    CONSULTS:  Consulted and discussed patient's case with hospitalist, Dr. Damita Dunnings.  I have recommended admission and consulting physician agrees and will place admission orders.  Patient (and family if present) agree with this plan.   I reviewed all nursing notes, vitals, pertinent previous records.  All labs, EKGs, imaging ordered have been independently reviewed and interpreted by  myself.    OUTSIDE RECORDS REVIEWED: Reviewed last rheumatology note in August 2023.       FINAL CLINICAL IMPRESSION(S) / ED DIAGNOSES   Final diagnoses:  Acute respiratory failure with hypoxia (HCC)  Fever in adult  Acute cough     Rx / DC Orders   ED Discharge Orders     None        Note:  This document was prepared using Dragon voice recognition software and may include unintentional dictation errors.   Kiaria Quinnell, Delice Bison, DO 11/04/22 [phone]

## 2022-11-04 NOTE — ED Notes (Signed)
Pt requesting to keep oxygen off. Pt currently 95-97% on RA.

## 2022-11-04 NOTE — H&P (Signed)
History and Physical    Patient: Lindsey Bautista [id number] DOB: 01/25/1961 DOA: 11/04/2022 DOS: the patient was seen and examined on 11/04/2022 PCP: Venita Lick, NP  Patient coming from: Home  Chief Complaint:  Chief Complaint  Patient presents with   Chest Pain    HPI: Lindsey Bautista is a 62 y.o. female with medical history significant for COPD, nonobstructive CAD, diastolic CHF who presents to the ED with a fever, chills, pleuritic chest pain, shortness of breath and cough.  Patient was seen in the ED 2 days prior on 3/13 with abdominal pain and had a negative CT and discharge.  Presenting symptoms developed over the past couple days. ED course and data review: Temperature 99.6 orally, 101.3 rectally and O2 sat 88% on room air requiring 2 L to maintain sats in the high 90s.  Respiratory viral panel negative.  CBC normal, procalcitonin less than 0.01 and lactic acid 1.3.  Other labs unremarkable EKG shows NSR at 97 with nonspecific ST-T wave changes.  Chest x-ray clear  Patient was treated with doxycycline and albuterol. Admission requested for clinical pneumonia    Past Medical History:  Diagnosis Date   Arthritis    Atypical chest pain    a. 08/2016 Neg MV.   COPD (chronic obstructive pulmonary disease) (HCC)    Coronary artery disease, non-occlusive    a.  LHC 06/19/2018: Mid LAD 50% with iFR 0.94   Depression    Diastolic dysfunction    a. 10/2016 Echo: EF 60-65%, no rwma, Gr1 DD, mild MR, nl LA size, nl RV fxn; b. 05/2018 Echo: EF 60-65%. No rwma. Mild MR. Nl RV fxn.   Fibromyalgia    GERD (gastroesophageal reflux disease)    Hypotension    IBS (irritable bowel syndrome)    Right arm weakness    Situational syncope    Stroke ([place])    2013, 2018.  Some short term memory issues, some weakness   TIA (transient ischemic attack)    Wears dentures    full upper   Past Surgical History:  Procedure Laterality Date   CATARACT EXTRACTION W/PHACO Right 05/04/2022    Procedure: CATARACT EXTRACTION PHACO AND INTRAOCULAR LENS PLACEMENT ([facility]) RIGHT 3.89 00:47.4;  Surgeon: Norvel Richards, MD;  Location: [facility];  Service: Ophthalmology;  Laterality: Right;   CATARACT EXTRACTION W/PHACO Left 05/18/2022   Procedure: CATARACT EXTRACTION PHACO AND INTRAOCULAR LENS PLACEMENT ([place]) LEFT 1.44 00:19.7;  Surgeon: Norvel Richards, MD;  Location: [facility];  Service: Ophthalmology;  Laterality: Left;   COLONOSCOPY WITH PROPOFOL N/A 11/22/2015   Procedure: COLONOSCOPY WITH PROPOFOL;  Surgeon: Manya Silvas, MD;  Location: [facility] ENDOSCOPY;  Service: Endoscopy;  Laterality: N/A;   ESOPHAGOGASTRODUODENOSCOPY (EGD) WITH PROPOFOL N/A 11/22/2015   Procedure: ESOPHAGOGASTRODUODENOSCOPY (EGD) WITH PROPOFOL;  Surgeon: Manya Silvas, MD;  Location: [facility] ENDOSCOPY;  Service: Endoscopy;  Laterality: N/A;   INTRAVASCULAR PRESSURE WIRE/FFR STUDY N/A 06/19/2018   Procedure: INTRAVASCULAR PRESSURE WIRE/FFR STUDY;  Surgeon: Nelva Bush, MD;  Location: [facility] CV LAB;  Service: Cardiovascular;  Laterality: N/A;   LEFT HEART CATH AND CORONARY ANGIOGRAPHY N/A 06/19/2018   Procedure: LEFT HEART CATH AND CORONARY ANGIOGRAPHY;  Surgeon: Nelva Bush, MD;  Location: [place] CV LAB;  Service: Cardiovascular;  Laterality: N/A;   OOPHORECTOMY     TONSILLECTOMY     VAGINAL HYSTERECTOMY     Social History:  reports that she has been smoking cigarettes. She has a 33.75 pack-year smoking history.  She has never used smokeless tobacco. She reports that she does not drink alcohol and does not use drugs.  Allergies  Allergen Reactions   Amoxicillin Anaphylaxis    Facial swelling and SOB   Flagyl [Metronidazole] Anaphylaxis   Penicillin G Anaphylaxis and Other (See Comments)    Has patient had a PCN reaction causing immediate rash, facial/tongue/throat swelling, SOB or lightheadedness with hypotension: [id number] Has patient had a PCN reaction  causing severe rash involving mucus membranes or skin necrosis: no:[id number]} Has patient had a PCN reaction that required hospitalization no:[id number]} Has patient had a PCN reaction occurring within the last 10 years: [id number] If all of the above answers are "NO", then may proceed with Cephalosporin use.    Iodinated Contrast Media Rash and Nausea And Vomiting   Alpha-Gal    Flomax [Tamsulosin Hcl] Hives    Per ER visit 03/11/19    Family History  Problem Relation Age of Onset   Bladder Cancer Mother 44   Heart failure Mother    Renal Disease Mother    COPD Father    Bladder Cancer Maternal Aunt    Bladder Cancer Maternal Aunt    Breast cancer Maternal Aunt    Breast cancer Paternal Aunt 71   Colon cancer Maternal Grandmother    Cancer Paternal Grandfather        unk primary, metastatic, d 62s   Heart disease Other    COPD Other    Stroke Other    Prostate cancer Cousin        metastatic   Prostate cancer Cousin     Prior to Admission medications   Medication Sig Start Date End Date Taking? Authorizing Provider  aspirin 81 MG tablet Take 81 mg by mouth daily.   Yes [provider]  atorvastatin (LIPITOR) 80 MG tablet Take 1 tablet by mouth once daily 01/09/22  Yes [name], Jolene T, NP  clindamycin (CLEOCIN) 2 % vaginal cream Apply one application twice a week to vaginal area for recurrent BV infections.  Do this regimen for the next 6 months. 10/25/22  Yes [name], Jolene T, NP  clonazePAM (KLONOPIN) 1 MG tablet Take 1 tablet (1 mg total) by mouth 2 (two) times daily. 09/15/22  Yes [name], Jolene T, NP  dimenhyDRINATE (DRAMAMINE) 50 MG tablet Take 50 mg by mouth every 8 (eight) hours as needed.   Yes [provider]  docusate sodium (COLACE) 100 MG capsule Take 100 mg by mouth daily.    Yes [provider]  EPINEPHrine 0.3 mg/0.3 mL IJ SOAJ injection Inject 0.3 mg into the muscle as needed for anaphylaxis. 06/09/22  Yes [name], Henrine Screws T, NP   estradiol (ESTRACE) 0.5 MG tablet Take 1 tablet by mouth once daily 01/09/22  Yes [name], Jolene T, NP  ezetimibe (ZETIA) 10 MG tablet Take 1 tablet by mouth once daily 01/09/22  Yes [name], Jolene T, NP  FLUoxetine (PROZAC) 40 MG capsule Take 1 capsule by mouth once daily 01/09/22  Yes [name], Jolene T, NP  ibuprofen (ADVIL) 800 MG tablet Take 800 mg by mouth every 8 (eight) hours as needed.   Yes [provider]  LINZESS 72 MCG capsule TAKE 1 CAPSULE BY MOUTH ONCE DAILY BEFORE BREAKFAST 01/09/22  Yes [name], Jolene T, NP  nitrofurantoin, macrocrystal-monohydrate, (MACROBID) 100 MG capsule Take 1 capsule (100 mg total) by mouth 2 (two) times daily for 5 days. 11/01/22 11/06/22 Yes Nance Pear, MD  nitroGLYCERIN (NITROSTAT) 0.4 MG SL tablet Place 1 tablet (0.4  mg total) under the tongue every 5 (five) minutes as needed for chest pain. 06/09/22  Yes [name], Jolene T, NP  omeprazole (PRILOSEC) 40 MG capsule Take by mouth. 10/24/22  Yes [provider]  famotidine (PEPCID) 40 MG tablet Take 1 tablet by mouth at bedtime. Patient not taking: Reported on 11/04/2022 10/24/22   [provider]  phenazopyridine (PYRIDIUM) 100 MG tablet Take 1 tablet (100 mg total) by mouth 3 (three) times daily as needed for pain. Patient not taking: Reported on 11/04/2022 11/01/22 11/01/23  Nance Pear, MD    Physical Exam: Vitals:   11/04/22 0207 11/04/22 0215 11/04/22 0245 11/04/22 0418  BP:    127/61  Pulse:  95 94 96  Resp:  18 15 19   Temp: (!) 101.3 F (38.5 C)     TempSrc: Rectal     SpO2:  100% 100% 99%  Weight:      Height:       Physical Exam Vitals and nursing note reviewed.  Constitutional:      General: She is not in acute distress. HENT:     Head: Normocephalic and atraumatic.  Cardiovascular:     Rate and Rhythm: Normal rate and regular rhythm.     Heart sounds: Normal heart sounds.  Pulmonary:     Effort: Pulmonary effort is normal.     Breath sounds: Normal  breath sounds.  Abdominal:     Palpations: Abdomen is soft.     Tenderness: There is no abdominal tenderness.  Neurological:     Mental Status: Mental status is at baseline.     Labs on Admission: I have personally reviewed following labs and imaging studies  CBC: Recent Labs  Lab 11/01/22 1858 11/04/22 0103  WBC 5.9 7.8  HGB 14.0 13.0  HCT 42.7 40.4  MCV 89.0 89.6  PLT 266 [id number]   Basic Metabolic Panel: Recent Labs  Lab 11/01/22 1858 11/04/22 0103  NA 140 137  K 3.9 3.4*  CL 103 104  CO2 28 27  GLUCOSE 100* 120*  BUN 10 10  CREATININE 0.92 0.91  CALCIUM 8.9 8.9   GFR: Estimated Creatinine Clearance: 58.5 mL/min (by C-G formula based on SCr of 0.91 mg/dL). Liver Function Tests: Recent Labs  Lab 11/01/22 1858  AST 25  ALT 19  ALKPHOS 80  BILITOT 0.7  PROT 7.6  ALBUMIN 4.3   Recent Labs  Lab 11/01/22 1858  LIPASE 43   No results for input(s): "AMMONIA" in the last 168 hours. Coagulation Profile: No results for input(s): "INR", "PROTIME" in the last 168 hours. Cardiac Enzymes: No results for input(s): "CKTOTAL", "CKMB", "CKMBINDEX", "TROPONINI" in the last 168 hours. BNP (last 3 results) No results for input(s): "PROBNP" in the last 8760 hours. HbA1C: No results for input(s): "HGBA1C" in the last 72 hours. CBG: No results for input(s): "GLUCAP" in the last 168 hours. Lipid Profile: No results for input(s): "CHOL", "HDL", "LDLCALC", "TRIG", "CHOLHDL", "LDLDIRECT" in the last 72 hours. Thyroid Function Tests: No results for input(s): "TSH", "T4TOTAL", "FREET4", "T3FREE", "THYROIDAB" in the last 72 hours. Anemia Panel: No results for input(s): "VITAMINB12", "FOLATE", "FERRITIN", "TIBC", "IRON", "RETICCTPCT" in the last 72 hours. Urine analysis:    Component Value Date/Time   COLORURINE YELLOW (A) 11/04/2022 0131   APPEARANCEUR CLEAR (A) 11/04/2022 0131   APPEARANCEUR Clear 10/25/2022 1358   LABSPEC 1.012 11/04/2022 0131   LABSPEC 1.005 04/27/2014  1404   PHURINE 6.0 11/04/2022 0131   GLUCOSEU NEGATIVE 11/04/2022 0131  GLUCOSEU Negative 04/27/2014 1404   HGBUR SMALL (A) 11/04/2022 0131   BILIRUBINUR NEGATIVE 11/04/2022 0131   BILIRUBINUR Negative 10/25/2022 1358   BILIRUBINUR Negative 04/27/2014 1404   KETONESUR NEGATIVE 11/04/2022 0131   PROTEINUR NEGATIVE 11/04/2022 0131   NITRITE NEGATIVE 11/04/2022 0131   LEUKOCYTESUR NEGATIVE 11/04/2022 0131   LEUKOCYTESUR Negative 04/27/2014 1404    Radiological Exams on Admission: DG Chest Portable 1 View  Result Date: 11/04/2022 CLINICAL DATA:  Dyspnea, chest pain, hypoxia EXAM: PORTABLE CHEST 1 VIEW COMPARISON:  None Available. FINDINGS: The heart size and mediastinal contours are within normal limits. Both lungs are clear. The visualized skeletal structures are unremarkable. IMPRESSION: No active disease. Electronically Signed   By: Fidela Salisbury M.D.   On: 11/04/2022 01:41     Data Reviewed: Relevant notes from primary care and specialist visits, past discharge summaries as available in EHR, including Care Everywhere. Prior diagnostic testing as pertinent to current admission diagnoses Updated medications and problem lists for reconciliation ED course, including vitals, labs, imaging, treatment and response to treatment Triage notes, nursing and pharmacy notes and ED provider's notes Notable results as noted in HPI   Assessment and Plan: * Respiratory tract infection COPD Hypoxia Patient has fever, cough, shortness of breath, not meeting sepsis criteria, no leukocytosis and procalcitonin less than 0.1, chest x-ray clear.  O2 sat of 88% on 2 L Concern for clinical pneumonia by ED provider Supplemental oxygen and wean as tolerated Treat symptoms   Coronary artery disease, non-occlusive Continue atorvastatin, aspirin and nitroglycerin as needed  Anxiety Continue fluoxetine and clonazepam    DVT prophylaxis: Lovenox  Consults: none  Advance Care Planning:   Code  Status: Prior   Family Communication: none  Disposition Plan: Back to previous home environment  Severity of Illness: The appropriate patient status for this patient is OBSERVATION. Observation status is judged to be reasonable and necessary in order to provide the required intensity of service to ensure the patient's safety. The patient's presenting symptoms, physical exam findings, and initial radiographic and laboratory data in the context of their medical condition is felt to place them at decreased risk for further clinical deterioration. Furthermore, it is anticipated that the patient will be medically stable for discharge from the hospital within 2 midnights of admission.   Author: Athena Masse, MD 11/04/2022 5:03 AM  For on call review [email or web address].

## 2022-11-04 NOTE — ED Notes (Signed)
Labs collected prior to this RN engagement

## 2022-11-04 NOTE — ED Triage Notes (Signed)
Pt presents via wheelchair to triage via EMS from home with complaints of CP with radiation to her upper back that started tonight. Pt took 324mg  ASA at home prior to EMS arrival; given 1 nitro by EMS and 4mg  zofran without any improvement in her sx. Hx of GERD; currently being treated for UTI. A&Ox4 at this time. Denies SOB.

## 2022-11-04 NOTE — Assessment & Plan Note (Signed)
Continue fluoxetine and clonazepam

## 2022-11-04 NOTE — ED Notes (Signed)
Pt has known hx of COPD -88% on RA and endorses pain when taking a deep breath. Pt placed on 3L [place] and sats improved to 97%

## 2022-11-04 NOTE — ED Notes (Signed)
Pt stated they felt wet. Tech changed pt's pure wick and gown. All of pt's personal belongings were put in personal belongings bag. Pt expressed no further needs at this time.

## 2022-11-04 NOTE — ED Notes (Signed)
Pt advising she does not want catheter placed at this time, advising "I want to try and go on my own"

## 2022-11-04 NOTE — Assessment & Plan Note (Addendum)
COPD Hypoxia Patient has fever, cough, shortness of breath, not meeting sepsis criteria, no leukocytosis and procalcitonin less than 0.1, chest x-ray clear.  O2 sat of 88% on 2 L Concern for clinical pneumonia by ED provider Supplemental oxygen and wean as tolerated Treat symptoms

## 2022-11-04 NOTE — Assessment & Plan Note (Signed)
Continue atorvastatin, aspirin and nitroglycerin as needed

## 2022-11-04 NOTE — ED Notes (Signed)
Advised nurse that patient has ready bed 

## 2022-11-04 NOTE — ED Notes (Signed)
UA when able 

## 2022-11-05 DIAGNOSIS — J988 Other specified respiratory disorders: Secondary | ICD-10-CM | POA: Diagnosis not present

## 2022-11-05 DIAGNOSIS — J9601 Acute respiratory failure with hypoxia: Secondary | ICD-10-CM | POA: Diagnosis not present

## 2022-11-05 LAB — URINE CULTURE: Culture: <10000 — AB

## 2022-11-05 NOTE — Progress Notes (Signed)
Pt's spouse at the [facility] entrance for pt discharge; pt discharged via wheelchair by nursing to the [facility] entrance

## 2022-11-05 NOTE — Discharge Summary (Signed)
Physician Discharge Summary   Patient: Lindsey Bautista MRN: [id number] DOB: [birth year]  Admit date:     11/04/2022  Discharge date: 11/05/22  Discharge Physician: Oran Rein   PCP: Venita Lick, NP   Recommendations at discharge:   Follow-up with PCP in 2 to 4 weeks.  Discharge Diagnoses: Principal Problem:   Respiratory tract infection Active Problems:   Centrilobular emphysema (HCC)   Anxiety   Coronary artery disease, non-occlusive  Resolved Problems:   * No resolved hospital problems. *  Hospital Course:  Lindsey Bautista is a 62 y.o. female with medical history significant for COPD, nonobstructive CAD, diastolic CHF who presents to the ED with a fever, chills, pleuritic chest pain, shortness of breath and cough.  Patient was seen in the ED 2 days prior on 3/13 with abdominal pain and had a negative CT and discharge.  Presenting symptoms developed over the past couple days. ED course and data review: Temperature 99.6 orally, 101.3 rectally and O2 sat 88% on room air requiring 2 L to maintain sats in the high 90s.  Respiratory viral panel negative.  CBC normal, procalcitonin less than 0.01 and lactic acid 1.3.  Other labs unremarkable EKG shows NSR at 97 with nonspecific ST-T wave changes.  Chest x-ray clear  Patient was treated with doxycycline and albuterol. Admission requested for clinical pneumonia   3/17 : Patient was seen and examined workup grossly unremarkable.  No overnight events.  Patient asymptomatic, vital labs and imaging reviewed.  As patient workup has been grossly unremarkable URI likely secondary to viral prodrome.  No antibiotics indicated.  Patient symptomatically better has been breathing normal on room air.  Plan to discharge home with follow-up with PCP in 2 to 4 weeks.  Patient advised if condition worsens to seek emergent help.  Assessment and Plan: * Respiratory tract infection COPD Hypoxia Patient has fever, cough, shortness of breath, not  meeting sepsis criteria, no leukocytosis and procalcitonin less than 0.1, chest x-ray clear.  O2 sat of 88% on 2 L Concern for clinical pneumonia by ED provider Supplemental oxygen and wean as tolerated Treat symptoms   Coronary artery disease, non-occlusive Continue atorvastatin, aspirin and nitroglycerin as needed  Anxiety Continue fluoxetine and clonazepam      Pain control - [place] Controlled Substance Reporting System database was reviewed. and patient was instructed, not to drive, operate heavy machinery, perform activities at heights, swimming or participation in water activities or provide baby-sitting services while on Pain, Sleep and Anxiety Medications; until their outpatient Physician has advised to do so again. Also recommended to not to take more than prescribed Pain, Sleep and Anxiety Medications.  Consultants:  Procedures performed:  Disposition: Home Diet recommendation:  Discharge Diet Orders (From admission, onward)     Start     Ordered   11/05/22 0000  Diet - low sodium heart healthy        11/05/22 1056           Cardiac diet DISCHARGE MEDICATION: Allergies as of 11/05/2022       Reactions   Amoxicillin Anaphylaxis   Facial swelling and SOB   Flagyl [metronidazole] Anaphylaxis   Penicillin G Anaphylaxis, Other (See Comments)   Has patient had a PCN reaction causing immediate rash, facial/tongue/throat swelling, SOB or lightheadedness with hypotension: [id number] Has patient had a PCN reaction causing severe rash involving mucus membranes or skin necrosis: [id number] Has patient had a PCN reaction that required hospitalization [id number]} Has patient had a  PCN reaction occurring within the last 10 years: [id number] If all of the above answers are "NO", then may proceed with Cephalosporin use.   Iodinated Contrast Media Rash, Nausea And Vomiting   Alpha-gal    Flomax [tamsulosin Hcl] Hives   Per ER visit 03/11/19         Medication List     TAKE these medications    aspirin 81 MG tablet Take 81 mg by mouth daily.   atorvastatin 80 MG tablet Commonly known as: LIPITOR Take 1 tablet by mouth once daily   clindamycin 2 % vaginal cream Commonly known as: Cleocin Apply one application twice a week to vaginal area for recurrent BV infections.  Do this regimen for the next 6 months.   clonazePAM 1 MG tablet Commonly known as: KLONOPIN Take 1 tablet (1 mg total) by mouth 2 (two) times daily.   dimenhyDRINATE 50 MG tablet Commonly known as: DRAMAMINE Take 50 mg by mouth every 8 (eight) hours as needed.   docusate sodium 100 MG capsule Commonly known as: COLACE Take 100 mg by mouth daily.   EPINEPHrine 0.3 mg/0.3 mL Soaj injection Commonly known as: EPI-PEN Inject 0.3 mg into the muscle as needed for anaphylaxis.   estradiol 0.5 MG tablet Commonly known as: ESTRACE Take 1 tablet by mouth once daily   ezetimibe 10 MG tablet Commonly known as: ZETIA Take 1 tablet by mouth once daily   famotidine 40 MG tablet Commonly known as: PEPCID Take 1 tablet by mouth at bedtime.   FLUoxetine 40 MG capsule Commonly known as: PROZAC Take 1 capsule by mouth once daily   ibuprofen 800 MG tablet Commonly known as: ADVIL Take 800 mg by mouth every 8 (eight) hours as needed.   Linzess 72 MCG capsule Generic drug: linaclotide TAKE 1 CAPSULE BY MOUTH ONCE DAILY BEFORE BREAKFAST   nitrofurantoin (macrocrystal-monohydrate) 100 MG capsule Commonly known as: Macrobid Take 1 capsule (100 mg total) by mouth 2 (two) times daily for 5 days.   nitroGLYCERIN 0.4 MG SL tablet Commonly known as: NITROSTAT Place 1 tablet (0.4 mg total) under the tongue every 5 (five) minutes as needed for chest pain.   omeprazole 40 MG capsule Commonly known as: PRILOSEC Take by mouth.   phenazopyridine 100 MG tablet Commonly known as: PYRIDIUM Take 1 tablet (100 mg total) by mouth 3 (three) times daily as needed for  pain.        Discharge Exam: Filed Weights   11/04/22 0102 11/04/22 1238  Weight: 67.6 kg 67.8 kg   Physical Exam HENT:     Head: Normocephalic and atraumatic.  Eyes:     Extraocular Movements: Extraocular movements intact.     Pupils: Pupils are equal, round, and reactive to light.  Cardiovascular:     Rate and Rhythm: Normal rate and regular rhythm.  Pulmonary:     Effort: Pulmonary effort is normal.  Abdominal:     General: Abdomen is flat.  Musculoskeletal:        General: Normal range of motion.     Cervical back: Normal range of motion.  Skin:    General: Skin is warm.  Neurological:     General: No focal deficit present.     Mental Status: She is alert.  Psychiatric:        Mood and Affect: Mood normal.        Behavior: Behavior normal.        Thought Content: Thought content normal.  Condition at discharge: good  The results of significant diagnostics from this hospitalization (including imaging, microbiology, ancillary and laboratory) are listed below for reference.   Imaging Studies: DG Chest Portable 1 View  Result Date: 11/04/2022 CLINICAL DATA:  Dyspnea, chest pain, hypoxia EXAM: PORTABLE CHEST 1 VIEW COMPARISON:  None Available. FINDINGS: The heart size and mediastinal contours are within normal limits. Both lungs are clear. The visualized skeletal structures are unremarkable. IMPRESSION: No active disease. Electronically Signed   By: Fidela Salisbury M.D.   On: 11/04/2022 01:41   CT ABDOMEN PELVIS WO CONTRAST  Result Date: 11/01/2022 CLINICAL DATA:  Lower abdominal pain, bloating, nausea EXAM: CT ABDOMEN AND PELVIS WITHOUT CONTRAST TECHNIQUE: Multidetector CT imaging of the abdomen and pelvis was performed following the standard protocol without IV contrast. RADIATION DOSE REDUCTION: This exam was performed according to the departmental dose-optimization program which includes automated exposure control, adjustment of the mA and/or kV according to  patient size and/or use of iterative reconstruction technique. COMPARISON:  10/30/2022, 06/27/2022 FINDINGS: Evaluation of the abdomen was limited due to streak artifact from retained barium from recent fluoroscopy procedure. Lower chest: No acute pleural or parenchymal lung disease. Hepatobiliary: Unremarkable unenhanced appearance of the liver and gallbladder. Pancreas: Unremarkable unenhanced appearance. Spleen: Unremarkable unenhanced appearance. Adrenals/Urinary Tract: No urinary tract calculi or obstructive uropathy within either kidney. Stable right adrenal adenoma. Left adrenal is unremarkable. Bladder is decompressed which limits its evaluation. Stomach/Bowel: No bowel obstruction or ileus. Retained barium within the colon from recent upper GI procedure. No evidence of bowel wall thickening or inflammatory change. Vascular/Lymphatic: Cho choose 2 ose 2 Reproductive: Status post hysterectomy. No adnexal masses. Other: No free fluid or free intraperitoneal gas. No abdominal wall hernia. Musculoskeletal: No acute or destructive bony lesions. Reconstructed images demonstrate no additional findings. IMPRESSION: 1. Limited study due to extensive streak artifact from retained barium. 2. No acute intra-abdominal or intrapelvic process. 3.  Aortic Atherosclerosis (ICD10-I70.0). Electronically Signed   By: Randa Ngo M.D.   On: 11/01/2022 22:53   DG UGI W DOUBLE CM (HD BA)  Result Date: 10/30/2022 CLINICAL DATA:  Gastroesophageal reflux disease without esophagitis. Atypical chest pain. Nausea. EXAM: UPPER GI SERIES WITHOUT KUB TECHNIQUE: Routine upper GI series was performed with thin/high density/water soluble barium. FLUOROSCOPY: Radiation Exposure Index (as provided by the fluoroscopic device): 24.7 mGy Kerma COMPARISON:  None Available. FINDINGS: Normal esophageal motility. No esophageal fold thickening, stricture or obstruction. Small hiatal hernia. IMPRESSION: Small hiatal hernia.  Otherwise normal  exam. Electronically Signed   By: Lorin Picket M.D.   On: 10/30/2022 08:52   CT CHEST LUNG CA SCREEN LOW DOSE W/O CM  Result Date: 10/12/2022 CLINICAL DATA:  62 year old female with 48 pack-year history of smoking. Lung cancer screening. EXAM: CT CHEST WITHOUT CONTRAST LOW-DOSE FOR LUNG CANCER SCREENING TECHNIQUE: Multidetector CT imaging of the chest was performed following the standard protocol without IV contrast. RADIATION DOSE REDUCTION: This exam was performed according to the departmental dose-optimization program which includes automated exposure control, adjustment of the mA and/or kV according to patient size and/or use of iterative reconstruction technique. COMPARISON:  None Available. FINDINGS: Cardiovascular: The heart size is normal. No substantial pericardial effusion. Mild atherosclerotic calcification is noted in the wall of the thoracic aorta. Mediastinum/Nodes: No mediastinal lymphadenopathy. No evidence for gross hilar lymphadenopathy although assessment is limited by the lack of intravenous contrast on the current study. The esophagus has normal imaging features. There is no axillary lymphadenopathy. Lungs/Pleura: Centrilobular emphsyema noted. No  new suspicious pulmonary nodule or mass. No focal airspace consolidation. No pleural effusion. 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 No change right adrenal adenoma. No followup imaging is recommended. Visualized portion of the upper abdomen is otherwise unremarkable. Musculoskeletal: No worrisome lytic or sclerotic osseous abnormality. IMPRESSION: 1. Lung-RADS 2, benign appearance or behavior. Continue annual screening with low-dose chest CT without contrast in 12 months. 2.  Centrilobular and paraseptal emphysema evident. Electronically Signed   By: Misty Stanley M.D.   On: 10/12/2022 10:14   Microbiology: Results for orders placed or performed during the hospital encounter of 11/04/22  Urine  Culture     Status: Abnormal   Collection Time: 11/04/22  1:31 AM   Specimen: Urine, Clean Catch  Result Value Ref Range Status   Specimen Description   Final    URINE, CLEAN CATCH Performed at [facility], [address] [place], [place] [address]    Special Requests   Final    NONE Performed at [facility], [address], [place], [place] [address]    Culture (A)  Final    <10,000 COLONIES/mL INSIGNIFICANT GROWTH Performed at [facility], [place] [address], [place], [place] [address]    Report Status 11/05/2022 FINAL  Final  Resp panel by RT-PCR (RSV, Flu A&B, Covid) Anterior Nasal Swab     Status: None   Collection Time: 11/04/22  1:32 AM   Specimen: Anterior Nasal Swab  Result Value Ref Range Status   SARS Coronavirus 2 by RT PCR NEGATIVE NEGATIVE Final    Comment: (NOTE) SARS-CoV-2 target nucleic acids are NOT DETECTED.  The SARS-CoV-2 RNA is generally detectable in upper respiratory specimens during the acute phase of infection. The lowest concentration of SARS-CoV-2 viral copies this assay can detect is 138 copies/mL. A negative result does not preclude SARS-Cov-2 infection and should not be used as the sole basis for treatment or other patient management decisions. A negative result may occur with  improper specimen collection/handling, submission of specimen other than nasopharyngeal swab, presence of viral mutation(s) within the areas targeted by this assay, and inadequate number of viral copies(<138 copies/mL). A negative result must be combined with clinical observations, patient history, and epidemiological information. The expected result is Negative.  Fact Sheet for Patients:  EntrepreneurPulse.com.au  Fact Sheet for Healthcare Providers:  IncredibleEmployment.be  This test is no t yet approved or cleared by the [place] FDA and  has been authorized for detection and/or diagnosis of  SARS-CoV-2 by FDA under an Emergency Use Authorization (EUA). This EUA will remain  in effect (meaning this test can be used) for the duration of the COVID-19 declaration under Section 564(b)(1) of the Act, 21 U.S.C.section 360bbb-3(b)(1), unless the authorization is terminated  or revoked sooner.       Influenza A by PCR NEGATIVE NEGATIVE Final   Influenza B by PCR NEGATIVE NEGATIVE Final    Comment: (NOTE) The Xpert Xpress SARS-CoV-2/FLU/RSV plus assay is intended as an aid in the diagnosis of influenza from Nasopharyngeal swab specimens and should not be used as a sole basis for treatment. Nasal washings and aspirates are unacceptable for Xpert Xpress SARS-CoV-2/FLU/RSV testing.  Fact Sheet for Patients: EntrepreneurPulse.com.au  Fact Sheet for Healthcare Providers: IncredibleEmployment.be  This test is not yet approved or cleared by the [place] FDA and has been authorized for detection and/or diagnosis of SARS-CoV-2 by FDA under an Emergency Use Authorization (EUA). This EUA  will remain in effect (meaning this test can be used) for the duration of the COVID-19 declaration under Section 564(b)(1) of the Act, 21 U.S.C. section 360bbb-3(b)(1), unless the authorization is terminated or revoked.     Resp Syncytial Virus by PCR NEGATIVE NEGATIVE Final    Comment: (NOTE) Fact Sheet for Patients: EntrepreneurPulse.com.au  Fact Sheet for Healthcare Providers: IncredibleEmployment.be  This test is not yet approved or cleared by the [place] FDA and has been authorized for detection and/or diagnosis of SARS-CoV-2 by FDA under an Emergency Use Authorization (EUA). This EUA will remain in effect (meaning this test can be used) for the duration of the COVID-19 declaration under Section 564(b)(1) of the Act, 21 U.S.C. section 360bbb-3(b)(1), unless the authorization is terminated  or revoked.  Performed at [facility], [address] [place], [place] [address]   Blood culture (routine x 2)     Status: None (Preliminary result)   Collection Time: 11/04/22  2:02 AM   Specimen: BLOOD  Result Value Ref Range Status   Specimen Description BLOOD BLOOD RIGHT HAND  Final   Special Requests   Final    BOTTLES DRAWN AEROBIC AND ANAEROBIC Blood Culture adequate volume   Culture   Final    NO GROWTH 1 DAY Performed at [facility], [address] [place], [place] [address]    Report Status PENDING  Incomplete  Blood culture (routine x 2)     Status: None (Preliminary result)   Collection Time: 11/04/22  2:02 AM   Specimen: BLOOD  Result Value Ref Range Status   Specimen Description BLOOD LEFT ANTECUBITAL  Final   Special Requests   Final    BOTTLES DRAWN AEROBIC AND ANAEROBIC Blood Culture adequate volume   Culture   Final    NO GROWTH 1 DAY Performed at [facility], [address] [place], [place] [address]    Report Status PENDING  Incomplete    Labs: CBC: Recent Labs  Lab 11/01/22 1858 11/04/22 0103 11/04/22 0820  WBC 5.9 7.8 8.3  HGB 14.0 13.0 12.1  HCT 42.7 40.4 37.6  MCV 89.0 89.6 89.3  PLT 266 [id number]   Basic Metabolic Panel: Recent Labs  Lab 11/01/22 1858 11/04/22 0103 11/04/22 0820  NA 140 137  --   K 3.9 3.4*  --   CL 103 104  --   CO2 28 27  --   GLUCOSE 100* 120*  --   BUN 10 10  --   CREATININE 0.92 0.91 0.90  CALCIUM 8.9 8.9  --    Liver Function Tests: Recent Labs  Lab 11/01/22 1858  AST 25  ALT 19  ALKPHOS 80  BILITOT 0.7  PROT 7.6  ALBUMIN 4.3   CBG: No results for input(s): "GLUCAP" in the last 168 hours.  Discharge time spent: greater than 30 minutes.  Signed: Oran Rein, MD [facility] 11/05/2022

## 2022-11-05 NOTE — Progress Notes (Signed)
MD order received in [facility] to discharge pt home today; verbally reviewed AVS with pt, no changes made to home medications; no questions voiced at this time by the pt; pt's discharge pending arrival of her spouse at the [facility] entrance, he will call her in the pt's room to let her know he is at the [place] entrance in order for staff to bring her via wheelchair to the [place] entrance

## 2022-11-05 NOTE — Plan of Care (Signed)

## 2022-11-10 LAB — CULTURE, BLOOD (ROUTINE X 2)
Culture: NO GROWTH
Culture: NO GROWTH
Special Requests: ADEQUATE
Special Requests: ADEQUATE

## 2022-11-26 NOTE — Patient Instructions (Signed)
Living with COPD Being diagnosed with chronic obstructive pulmonary disease (COPD) changes your life physically and emotionally. Having COPD can affect your ability to work and do things you enjoy. COPD is not the same for everyone, and it may change over time. Your health care providers can help you come up with the COPD management plan that works best for you. How to manage lifestyle changes Treatment plan Work closely with your health care providers. Follow your COPD management plan. This plan includes: Instructions about activities, exercises, diet, medicines, what to do when COPD flares up, and when to call your health care provider. A pulmonary rehabilitation program. In pulmonary rehab, you will learn about COPD, do exercises for fitness and breathing, and get support from health care providers and other people who have COPD. Managing emotions and stress Living with a chronic disease means you may also struggle with stressful emotions, such as sadness, fear, and worry. Here are some ways to manage these emotions: Talk to someone about your fear, anxiety, depression, or stress. Learn strategies to avoid or reduce stress and ask for help if you are struggling with depression or anxiety. Consider joining a COPD support group, online or in person.  Adjusting to changes COPD may limit the things you can do, but you can make certain changes to help you cope with the diagnosis. Ask for help when you need it. Getting support from friends, family, and your health care team is an important part of managing the condition. Try to get regular exercise as prescribed by a health care provider or pulmonary rehab team. Exercising can help COPD, even if you are a bit short of breath. Take steps to prevent infection and protect your lungs: Wash your hands often and avoid being in crowds. Stay away from friends and family members who are sick. Check your local air quality each day, and stay out of areas  where air pollution is likely. How to recognize changes in your condition Recognizing changes in your COPD COPD is a progressive disease. It is important to let the health care team know if your COPD is getting worse. Your treatment plan may need to change. Watch for: Increased shortness of breath, wheezing, cough, or fatigue. Loss of ability to exercise or perform daily activities, like climbing stairs. More frequent symptom flares. Signs of depression or anxiety. Recognizing stress It is normal to have additional stress when you have COPD. However, prolonged stress and anxiety can make COPD worse and lead to depression. Recognize the warning signs, which include: Feeling sad or worried more often or most of the time. Having less energy and losing interest in pleasurable activities. Changes in your appetite or sleeping patterns. Being easily angered or irritated. Having unexplained aches and pains, digestive problems, or headaches. Follow these instructions at home: Eating and drinking  Eat foods that are high in fiber, such as fresh fruits and vegetables, whole grains, and beans. Limit foods that are high in fat and processed sugars, such as fried or sweet foods. Follow a balanced diet and maintain a healthy weight. Being overweight or underweight can make COPD worse. You may work with a dietitian as part of your pulmonary rehab program. Drink enough fluid to keep your urine pale yellow. If you drink alcohol: Limit how much you have to: 0-1 drink a day for women who are not pregnant. 0-2 drinks a day for men. Know how much alcohol is in your drink. In the [place] one drink equals one 12 oz bottle   of beer (355 mL), one 5 oz glass of wine (148 mL), or one 1 oz glass of hard liquor (44 mL). Lifestyle If you smoke, the most important thing that you can do is to stop smoking. Continuing to smoke will cause the disease to progress faster. Do not use any products that contain nicotine or  tobacco. These products include cigarettes, chewing tobacco, and vaping devices, such as e-cigarettes. If you need help quitting, ask your health care provider. Avoid exposure to things that irritate your lungs, such as smoke, chemicals, and fumes. Activity Balance exercise and rest. Take short walks every 1-2 hours. This is important to improve blood flow and breathing. Ask for help if you feel weak or unsteady. Do exercises that include controlled breathing with body movement, such as tai chi. General instructions Take over-the-counter and prescription medicines only as told by your health care provider. Take vitamin and protein supplements as told by your health care provider or dietitian. Practice good oral hygiene and see your dental care provider regularly. An oral infection can also spread to your lungs. Make sure you receive all the vaccines that your health care provider recommends. Keep all follow-up visits. This is important. Contact a health care provider if you: Are struggling to manage your COPD. Have emotional stress that interferes with your ability to cope with COPD. Get help right away if you: Have thoughts of suicide, death, or hurting yourself or others. If you ever feel like you may hurt yourself or others, or have thoughts about taking your own life, get help right away. Go to your nearest emergency department or: Call your local emergency services (911 in the [place] Call a suicide crisis helpline, such as the National Suicide Prevention Lifeline at 1-800-273-8255 or 988 in the [place] This is open 24 hours a day in the [place] Text the Crisis Text Line at 741741 (in the [place] Summary Being diagnosed with chronic obstructive pulmonary disease (COPD) changes your life physically and emotionally. Work with your health care providers and follow your COPD management plan. A pulmonary rehabilitation program is an important part of COPD management. Prolonged stress, anxiety, and  depression can make COPD worse. Let your health care provider know if emotional stress interferes with your ability to cope with and manage COPD. This information is not intended to replace advice given to you by your health care provider. Make sure you discuss any questions you have with your health care provider. Document Revised: 03/02/2021 Document Reviewed: 08/25/2020 Elsevier Patient Education  2023 [employer]  

## 2022-12-01 ENCOUNTER — Encounter: Payer: Self-pay | Admitting: Nurse Practitioner

## 2022-12-01 ENCOUNTER — Ambulatory Visit (INDEPENDENT_AMBULATORY_CARE_PROVIDER_SITE_OTHER): Payer: Medicare Other | Admitting: Nurse Practitioner

## 2022-12-01 VITALS — BP 129/75 | HR 66 | Temp 97.8°F | Ht 62.01 in | Wt 151.0 lb

## 2022-12-01 DIAGNOSIS — F339 Major depressive disorder, recurrent, unspecified: Secondary | ICD-10-CM

## 2022-12-01 DIAGNOSIS — F419 Anxiety disorder, unspecified: Secondary | ICD-10-CM | POA: Diagnosis not present

## 2022-12-01 DIAGNOSIS — J432 Centrilobular emphysema: Secondary | ICD-10-CM | POA: Diagnosis not present

## 2022-12-01 DIAGNOSIS — F447 Conversion disorder with mixed symptom presentation: Secondary | ICD-10-CM | POA: Diagnosis not present

## 2022-12-01 DIAGNOSIS — B9689 Other specified bacterial agents as the cause of diseases classified elsewhere: Secondary | ICD-10-CM | POA: Diagnosis not present

## 2022-12-01 DIAGNOSIS — Z91018 Allergy to other foods: Secondary | ICD-10-CM

## 2022-12-01 DIAGNOSIS — F1320 Sedative, hypnotic or anxiolytic dependence, uncomplicated: Secondary | ICD-10-CM

## 2022-12-01 DIAGNOSIS — E782 Mixed hyperlipidemia: Secondary | ICD-10-CM

## 2022-12-01 DIAGNOSIS — M797 Fibromyalgia: Secondary | ICD-10-CM

## 2022-12-01 DIAGNOSIS — N760 Acute vaginitis: Secondary | ICD-10-CM

## 2022-12-01 DIAGNOSIS — I700 Atherosclerosis of aorta: Secondary | ICD-10-CM

## 2022-12-01 DIAGNOSIS — F17210 Nicotine dependence, cigarettes, uncomplicated: Secondary | ICD-10-CM

## 2022-12-01 LAB — URINALYSIS, ROUTINE W REFLEX MICROSCOPIC
Bilirubin, UA: NEGATIVE
Glucose, UA: NEGATIVE
Ketones, UA: NEGATIVE
Leukocytes,UA: NEGATIVE
Nitrite, UA: NEGATIVE
Protein,UA: NEGATIVE
RBC, UA: NEGATIVE
Specific Gravity, UA: 1.01 (ref 1.005–1.030)
Urobilinogen, Ur: 0.2 mg/dL (ref 0.2–1.0)
pH, UA: 6.5 (ref 5.0–7.5)

## 2022-12-01 LAB — WET PREP FOR TRICH, YEAST, CLUE
Clue Cell Exam: NEGATIVE
Trichomonas Exam: NEGATIVE
Yeast Exam: NEGATIVE

## 2022-12-01 MED ORDER — CLONAZEPAM 1 MG PO TABS: 1.0000 mg | ORAL_TABLET | Freq: Two times a day (BID) | ORAL | 2 refills | Status: DC

## 2022-12-01 NOTE — Assessment & Plan Note (Signed)
Recurrence present.  Recent BV infection x 2 and today wet prep continues to be = for clue cells and has symptoms.  Prior had not had infection since January 2023.  At this time continue Clindamycin gel, which she has tolerated in past without ADR, 2 times a week for the next 6 months to see if we can prevent recurrence.  Recommend she continue estrace daily, as suspect vaginal atrophy contributing to infections.  Educated her on this.  Referral placed to return to GYN.

## 2022-12-01 NOTE — Assessment & Plan Note (Signed)
Chronic, ongoing.  Continue Klonopin at this time, patient educated on risks of medication and is not interested in reducing -- refills sent in.  Continue Prozac 40 MG which benefits anxiety and fibromyalgia, discussed with her changing to Zoloft or Duloxetine (which would benefit chronic pain issues) if increase anxiety/depression noted -- refuses.  At this time she denies SI/HI and wishes to maintain current regimen.  UDS yearly (next 08/31/23) and contract up to date.  Return in 3 months. Referral to therapy, which would be beneficial.

## 2022-12-01 NOTE — Assessment & Plan Note (Signed)
Chronic, ongoing.  Last exacerbation 11/04/22.  No current inhaler regimen.  Denies any symptoms.  Continue yearly lung screening and plan on spirometry next visit to assess function.  Recommend complete cessation of smoking.

## 2022-12-01 NOTE — Assessment & Plan Note (Addendum)
Chronic, ongoing.  Continue current medication regimen and adjust as needed.  Denies SI/HI.  Consider change from Prozac to Cymbalta in future, which would offer benefit with anxiety and depression + fibromyalgia/alpha gal -- she refuses this change today.  Very anxious about changes.  Referral to therapy, which would be beneficial.

## 2022-12-01 NOTE — Assessment & Plan Note (Signed)
Chronic, ongoing.  Recommended she try stopping Prozac and change to Duloxetine which would benefit mood and pain, she refuses this - fearful of change.  Recommend she continue Gabapentin. 

## 2022-12-01 NOTE — Progress Notes (Signed)
BP 129/75   Pulse 66   Temp 97.8 F (36.6 C) (Oral)   Ht 5' 2.01" (1.575 m)   Wt 151 lb (68.5 kg)   SpO2 97%   BMI 27.61 kg/m    Subjective:    Patient ID: Lindsey Bautista, female    DOB: 1961/03/03, 62 y.o.   MRN: [id number]  HPI: Lindsey Bautista is a 62 y.o. female  Chief Complaint  Patient presents with   COPD   Hyperlipidemia   Anxiety   Would like wet prep and urine rechecked today due to her recurrent BV, last treated on 10/25/22 with Clindamycin gel for extended period of 2 times a week for 6 months.  Has not been to GYN since March 2021.        HYPERLIPIDEMIA Continues on Atorvastatin and Zetia. History of CVA, >11 years ago.   Hyperlipidemia status: good compliance Satisfied with current treatment?  yes Side effects:  no Medication compliance: good compliance Supplements: none Aspirin:  yes The ASCVD Risk score (Arnett DK, et al., 2019) failed to calculate for the following reasons:   The patient has a prior MI or stroke diagnosis Chest pain:  no Coronary artery disease:  no Family history CAD:  yes Family history early CAD:  no   COPD Centrilobular & paraseptal emphysema and aortic atherosclerosis -- last CT scan 10/11/22 and remains stable with no cancerous findings. No current inhalers.  Was admitted to hospital on 11/04/22 for COPD exacerbation, has improved since then.   Currently smoking <1 PPD, has smoked for >40 years.  Not interested in quitting.   COPD status: stable Satisfied with current treatment?: yes Oxygen use: no Dyspnea frequency: no Cough frequency: occasional Rescue inhaler frequency: none Limitation of activity: no Productive cough: none Last Spirometry: unknown Pneumovax:  refuses Influenza: Up to Date   FIBROMYALGIA, ALPHA GAL, B12 DEFICIENCY Ongoing joint pain.  Saw rheumatology last 04/04/22.  Continues with GI issues, underlying Alpha Gal past labs -- was to see allergist but never attended.  Last saw GI 10/24/22.  They did UGI  noting small hiatal hernia. Duration: months Pain: yes Symmetric: yes -- R>L = 9/10 at worst when present Quality: dull, aching, and throbbing Frequency: constant Context:  worse Decreased function/range of motion: yes Erythema: none Swelling: yes -- right hand can not close it for a few hours after waking up Heat or warmth: none Morning stiffness: yes Aggravating factors: lifting heavy Alleviating factors: nothing Relief with NSAIDs?: No NSAIDs Taken Treatments attempted:  rest and heat  Involved Joints:     Hands: yes bilateral    Wrists: yes bilateral     Elbows: yes bilateral    Shoulders: yes bilateral    Back: yes     Hips: yes bilateral    Knees: yes bilateral    Ankles: yes bilateral    Feet: yes bilateral   DEPRESSION/ANXIETY  Continues on Prozac 40 MG daily and Klonopin 1 MG BID.  Pt is aware of risks of benzo medication use to include increased sedation, respiratory suppression, falls, dependence and cardiovascular events.  Pt would like to continue treatment as benefit determined to outweigh risk -- has been on this for many years.  On PDMP review last Klonopin fill 11/18/22 for 1 MG tablets.  No other controlled substances noted.  Had referral to therapist placed in past, but has not attended -- reports they could not find anyone who covers locally.  Her husband and her do not get  along well.   Mood status: stable Satisfied with current treatment?: yes Symptom severity: mild  Duration of current treatment : chronic Side effects: no Medication compliance: good compliance Psychotherapy/counseling: none Previous psychiatric medications: Prozac and Klonopin Depressed mood: no Anxious mood: yes Anhedonia: no Significant weight loss or gain: no Insomnia: yes hard to fall asleep -- endorses poor sleep pattern with hot flashes Fatigue: at times Feelings of worthlessness or guilt: none Impaired concentration/indecisiveness: yes Suicidal ideations: no Hopelessness:  no Crying spells: no    10/09/2022    1:10 PM 08/30/2022    2:59 PM 06/09/2022    1:40 PM 05/23/2022    1:37 PM 02/10/2022   11:18 AM  Depression screen PHQ 2/9  Decreased Interest 0 2 2 0 2  Down, Depressed, Hopeless 0 2  PHQ - 2 Score 0 4  Altered sleeping 0 1 2 0 3  Tired, decreased energy 0 3  Change in appetite 0 1 1 0 2  Feeling bad or failure about yourself  0 2 1 0 2  Trouble concentrating 0 3  Moving slowly or fidgety/restless 0 0 1 0 1  Suicidal thoughts 0 0 0 0 0  PHQ-9 Score 0 18  Difficult doing work/chores Not difficult at all Very difficult Somewhat difficult Not difficult at all Somewhat difficult      08/30/2022    3:00 PM 06/09/2022    1:40 PM 05/23/2022    1:37 PM 02/10/2022   11:19 AM  GAD 7 : Generalized Anxiety Score  Nervous, Anxious, on Edge Control/stop worrying 3 2 0 2  Worry too much - different things 3 2 0 2  Trouble relaxing 3 1 0 2  Restless 3 0 0 1  Easily annoyed or irritable 3 2 0 2  Afraid - awful might happen 3 1 0 2  Total GAD 7 Score Anxiety Difficulty Very difficult Somewhat difficult Not difficult at all Somewhat difficult      08/30/2022    3:24 PM 09/27/2020    2:05 PM 05/27/2018    1:40 PM 05/09/2017    1:27 PM  6CIT Screen  What Year? 0 points 0 points 0 points 0 points  What month? 0 points 0 points 0 points 0 points  What time? 0 points 0 points 0 points 0 points  Count back from 20 0 points 0 points 0 points 0 points  Months in reverse 0 points 2 points 0 points 0 points  Repeat phrase 0 points 8 points 2 points 6 points  Total Score 0 points 10 points 2 points 6 points   Relevant past medical, surgical, family and social history reviewed and updated as indicated. Interim medical history since our last visit reviewed. Allergies and medications reviewed and updated.  Review of Systems  Constitutional:  Negative for activity change, appetite change, diaphoresis, fatigue and  fever.  Respiratory:  Negative for cough, chest tightness, shortness of breath and wheezing.   Cardiovascular:  Negative for chest pain, palpitations and leg swelling.  Gastrointestinal:  Positive for abdominal distention, abdominal pain and nausea. Negative for constipation, diarrhea and vomiting.  Endocrine: Negative for cold intolerance and heat intolerance.  Neurological: Negative.   Psychiatric/Behavioral:  Negative for decreased concentration, self-injury, sleep disturbance and suicidal ideas. The patient is nervous/anxious.    Per HPI unless specifically indicated above  Objective:    BP 129/75   Pulse 66   Temp 97.8 F (36.6 C) (Oral)   Ht 5' 2.01" (1.575 m)   Wt 151 lb (68.5 kg)   SpO2 97%   BMI 27.61 kg/m   Wt Readings from Last 3 Encounters:  12/01/22 151 lb (68.5 kg)  11/04/22 149 lb 8 oz (67.8 kg)  11/01/22 149 lb (67.6 kg)    Physical Exam Vitals and nursing note reviewed.  Constitutional:      General: She is awake. She is not in acute distress.    Appearance: She is well-developed, well-groomed and overweight. She is not ill-appearing or toxic-appearing.  HENT:     Head: Normocephalic.     Right Ear: Hearing and external ear normal.     Left Ear: Hearing and external ear normal.  Eyes:     General: Lids are normal.        Right eye: No discharge.        Left eye: No discharge.     Extraocular Movements: Extraocular movements intact.     Conjunctiva/sclera: Conjunctivae normal.     Pupils: Pupils are equal, round, and reactive to light.     Visual Fields: Right eye visual fields normal and left eye visual fields normal.  Neck:     Thyroid: No thyromegaly.     Vascular: No carotid bruit.  Cardiovascular:     Rate and Rhythm: Normal rate and regular rhythm.     Heart sounds: Normal heart sounds. No murmur heard.    No gallop.  Pulmonary:     Effort: Pulmonary effort is normal. No accessory muscle usage or respiratory distress.     Breath sounds:  Normal breath sounds. No decreased breath sounds or wheezing.  Chest:  Breasts:    Right: Normal.     Left: Normal.  Abdominal:     General: Bowel sounds are normal.     Palpations: Abdomen is soft.     Tenderness: There is no right CVA tenderness, left CVA tenderness, guarding or rebound.     Hernia: No hernia is present.  Musculoskeletal:     Cervical back: Normal range of motion and neck supple.     Right lower leg: No edema.     Left lower leg: No edema.  Lymphadenopathy:     Cervical: No cervical adenopathy.     Upper Body:     Right upper body: No supraclavicular, axillary or pectoral adenopathy.     Left upper body: No supraclavicular, axillary or pectoral adenopathy.  Skin:    General: Skin is warm and dry.  Neurological:     Mental Status: She is alert and oriented to person, place, and time.     Cranial Nerves: Cranial nerves 2-12 are intact.     Sensory: Sensation is intact.     Motor: Motor function is intact.     Coordination: Coordination is intact.     Gait: Gait is intact.     Deep Tendon Reflexes: Reflexes are normal and symmetric.     Reflex Scores:      Brachioradialis reflexes are 2+ on the right side and 2+ on the left side.      Patellar reflexes are 2+ on the right side and 2+ on the left side. Psychiatric:        Attention and Perception: Attention normal.        Mood and Affect: Mood normal.        Speech: Speech  normal.        Behavior: Behavior normal. Behavior is cooperative.        Thought Content: Thought content normal.    Results for orders placed or performed during the hospital encounter of 11/04/22  Resp panel by RT-PCR (RSV, Flu A&B, Covid) Anterior Nasal Swab   Specimen: Anterior Nasal Swab  Result Value Ref Range   SARS Coronavirus 2 by RT PCR NEGATIVE NEGATIVE   Influenza A by PCR NEGATIVE NEGATIVE   Influenza B by PCR NEGATIVE NEGATIVE   Resp Syncytial Virus by PCR NEGATIVE NEGATIVE  Blood culture (routine x 2)   Specimen: BLOOD   Result Value Ref Range   Specimen Description BLOOD BLOOD RIGHT HAND    Special Requests      BOTTLES DRAWN AEROBIC AND ANAEROBIC Blood Culture adequate volume   Culture      NO GROWTH 6 DAYS Performed at [facility], [address], [place], [place] [address]    Report Status 11/10/2022 FINAL   Blood culture (routine x 2)   Specimen: BLOOD  Result Value Ref Range   Specimen Description BLOOD LEFT ANTECUBITAL    Special Requests      BOTTLES DRAWN AEROBIC AND ANAEROBIC Blood Culture adequate volume   Culture      NO GROWTH 6 DAYS Performed at [facility], [address] [place], [place] [address]    Report Status 11/10/2022 FINAL   Urine Culture   Specimen: Urine, Clean Catch  Result Value Ref Range   Specimen Description      URINE, CLEAN CATCH Performed at [facility], [address], [place], [place] [address]    Special Requests      NONE Performed at [facility], [address] [place], [place] [address]    Culture (A)     <10,000 COLONIES/mL INSIGNIFICANT GROWTH Performed at [facility], [address] [place], [place] [address]    Report Status 11/05/2022 FINAL   Basic metabolic panel  Result Value Ref Range   Sodium 137 135 - 145 mmol/L   Potassium 3.4 (L) 3.5 - 5.1 mmol/L   Chloride 104 98 - 111 mmol/L   CO2 27 22 - 32 mmol/L   Glucose, Bld 120 (H) 70 - 99 mg/dL   BUN 10 8 - 23 mg/dL   Creatinine, Ser 6.22 0.44 - 1.00 mg/dL   Calcium 8.9 8.9 - 63.3 mg/dL   GFR, Estimated >35 >45 mL/min   Anion gap 6 5 - 15  CBC  Result Value Ref Range   WBC 7.8 4.0 - 10.5 K/uL   RBC 4.51 3.87 - 5.11 MIL/uL   Hemoglobin 13.0 12.0 - 15.0 g/dL   HCT 62.5 63.8 - 93.7 %   MCV 89.6 80.0 - 100.0 fL   MCH 28.8 26.0 - 34.0 pg   MCHC 32.2 30.0 - 36.0 g/dL   RDW 34.2 87.6 - 81.1 %   Platelets 221 150 - 400 K/uL   nRBC 0.0 0.0 - 0.2 %  D-dimer, quantitative  Result Value Ref Range   D-Dimer, Quant 0.28 0.00 - 0.50  ug/mL-FEU  Urinalysis, Routine w reflex microscopic -Urine, Clean Catch  Result Value Ref Range   Color, Urine YELLOW (A) YELLOW   APPearance CLEAR (A) CLEAR   Specific Gravity, Urine 1.012 1.005 - 1.030   pH 6.0 5.0 - 8.0   Glucose, UA NEGATIVE NEGATIVE mg/dL   Hgb urine dipstick SMALL (A) NEGATIVE   Bilirubin  Urine NEGATIVE NEGATIVE   Ketones, ur NEGATIVE NEGATIVE mg/dL   Protein, ur NEGATIVE NEGATIVE mg/dL   Nitrite NEGATIVE NEGATIVE   Leukocytes,Ua NEGATIVE NEGATIVE   RBC / HPF 0-5 0 - 5 RBC/hpf   WBC, UA 0-5 0 - 5 WBC/hpf   Bacteria, UA NONE SEEN NONE SEEN   Squamous Epithelial / HPF 0-5 0 - 5 /HPF   Mucus PRESENT   Procalcitonin  Result Value Ref Range   Procalcitonin <0.10 ng/mL  Lactic acid, plasma  Result Value Ref Range   Lactic Acid, Venous 0.9 0.5 - 1.9 mmol/L  Brain natriuretic peptide  Result Value Ref Range   B Natriuretic Peptide 20.2 0.0 - 100.0 pg/mL  Lactic acid, plasma  Result Value Ref Range   Lactic Acid, Venous 1.3 0.5 - 1.9 mmol/L  CBC  Result Value Ref Range   WBC 8.3 4.0 - 10.5 K/uL   RBC 4.21 3.87 - 5.11 MIL/uL   Hemoglobin 12.1 12.0 - 15.0 g/dL   HCT 16.1 09.6 - 04.5 %   MCV 89.3 80.0 - 100.0 fL   MCH 28.7 26.0 - 34.0 pg   MCHC 32.2 30.0 - 36.0 g/dL   RDW 40.9 81.1 - 91.4 %   Platelets 203 150 - 400 K/uL   nRBC 0.0 0.0 - 0.2 %  Creatinine, serum  Result Value Ref Range   Creatinine, Ser 0.90 0.44 - 1.00 mg/dL   GFR, Estimated >78 >29 mL/min  HIV Antibody (routine testing w rflx)  Result Value Ref Range   HIV Screen 4th Generation wRfx Non Reactive Non Reactive  Troponin I (High Sensitivity)  Result Value Ref Range   Troponin I (High Sensitivity) 3 <18 ng/L  Troponin I (High Sensitivity)  Result Value Ref Range   Troponin I (High Sensitivity) 5 <18 ng/L      Assessment & Plan:   Problem List Items Addressed This Visit       Cardiovascular and Mediastinum   Aortic atherosclerosis    Chronic, ongoing, noted on October 2019 lung  screening CT.  Continue statin and ASA daily + recommend complete cessation of smoking.        Respiratory   Centrilobular emphysema - Primary    Chronic, ongoing.  Last exacerbation 11/04/22.  No current inhaler regimen.  Denies any symptoms.  Continue yearly lung screening and plan on spirometry next visit to assess function.  Recommend complete cessation of smoking.        Relevant Orders   Basic metabolic panel     Genitourinary   Bacterial vaginosis    Recurrence present.  Recent BV infection x 2 and today wet prep continues to be = for clue cells and has symptoms.  Prior had not had infection since January 2023.  At this time continue Clindamycin gel, which she has tolerated in past without ADR, 2 times a week for the next 6 months to see if we can prevent recurrence.  Recommend she continue estrace daily, as suspect vaginal atrophy contributing to infections.  Educated her on this.  Referral placed to return to GYN.      Relevant Orders   Urinalysis, Routine w reflex microscopic   WET PREP FOR TRICH, YEAST, CLUE   Ambulatory referral to Gynecology     Other   Allergy to alpha-gal    Ongoing.  Noted on labs with elevation, referral to allergist was placed, but she refuses to attend at this time, would benefit from this as suspect some of GI  and pain related to this.  Have educated her at length multiple times on diet changes needed.      Anxiety    Chronic, ongoing.  Continue Klonopin at this time, patient educated on risks of medication and is not interested in reducing -- refills sent in.  Continue Prozac 40 MG which benefits anxiety and fibromyalgia, discussed with her changing to Zoloft or Duloxetine (which would benefit chronic pain issues) if increase anxiety/depression noted -- refuses.  At this time she denies SI/HI and wishes to maintain current regimen.  UDS yearly (next 08/31/23) and contract up to date.  Return in 3 months. Referral to therapy, which would be beneficial.       Relevant Medications   clonazePAM (KLONOPIN) 1 MG tablet (Start on 12/18/2022)   Other Relevant Orders   Ambulatory referral to Psychology   Benzodiazepine dependence, continuous   Conversion disorder with mixed symptoms    Refer to anxiety plan of care.      Relevant Orders   Ambulatory referral to Psychology   Depression, recurrent    Chronic, ongoing.  Continue current medication regimen and adjust as needed.  Denies SI/HI.  Consider change from Prozac to Cymbalta in future, which would offer benefit with anxiety and depression + fibromyalgia/alpha gal -- she refuses this change today.  Very anxious about changes.  Referral to therapy, which would be beneficial.      Relevant Orders   Ambulatory referral to Psychology   Fibromyalgia    Chronic, ongoing.  Recommended she try stopping Prozac and change to Duloxetine which would benefit mood and pain, she refuses this - fearful of change.  Recommend she continue Gabapentin.      Relevant Medications   clonazePAM (KLONOPIN) 1 MG tablet (Start on 12/18/2022)   Hyperlipidemia    Chronic, ongoing.  Continue current medication regimen and collaboration with cardiology.  Lipid panel up to date.      Nicotine dependence, cigarettes, uncomplicated    I have recommended complete cessation of tobacco use. I have discussed various options available for assistance with tobacco cessation including over the counter methods (Nicotine gum, patch and lozenges). We also discussed prescription options (Chantix, Nicotine Inhaler / Nasal Spray). The patient is not interested in pursuing any prescription tobacco cessation options at this time.         Follow up plan: Return in about 3 months (around 03/02/2023) for HLD, COPD, MOOD -- spirometry and control subs contract needed.

## 2022-12-01 NOTE — Assessment & Plan Note (Signed)
Chronic, ongoing, noted on October 2019 lung screening CT.  Continue statin and ASA daily + recommend complete cessation of smoking. 

## 2022-12-01 NOTE — Progress Notes (Signed)
Please let Lindsey Bautista know on Monday that her urine and wet prep returned negative.  Good news!!!

## 2022-12-01 NOTE — Assessment & Plan Note (Signed)
Ongoing.  Noted on labs with elevation, referral to allergist was placed, but she refuses to attend at this time, would benefit from this as suspect some of GI and pain related to this.  Have educated her at length multiple times on diet changes needed. 

## 2022-12-01 NOTE — Assessment & Plan Note (Signed)
Chronic, ongoing.  Continue current medication regimen and collaboration with cardiology.  Lipid panel up to date.

## 2022-12-01 NOTE — Assessment & Plan Note (Signed)
I have recommended complete cessation of tobacco use. I have discussed various options available for assistance with tobacco cessation including over the counter methods (Nicotine gum, patch and lozenges). We also discussed prescription options (Chantix, Nicotine Inhaler / Nasal Spray). The patient is not interested in pursuing any prescription tobacco cessation options at this time.  

## 2022-12-01 NOTE — Assessment & Plan Note (Signed)
Refer to anxiety plan of care. 

## 2022-12-02 LAB — BASIC METABOLIC PANEL
BUN/Creatinine Ratio: 14 (ref 12–28)
BUN: 13 mg/dL (ref 8–27)
CO2: 24 mmol/L (ref 20–29)
Calcium: 9.7 mg/dL (ref 8.7–10.3)
Chloride: 101 mmol/L (ref 96–106)
Creatinine, Ser: 0.94 mg/dL (ref 0.57–1.00)
Glucose: 73 mg/dL (ref 70–99)
Potassium: 4.5 mmol/L (ref 3.5–5.2)
Sodium: 141 mmol/L (ref 134–144)
eGFR: 69 mL/min/{1.73_m2} (ref >59)

## 2022-12-02 NOTE — Progress Notes (Signed)
Good morning, please let Lindsey Bautista know her blood work returned and everything is normal.  [employer]!!  No changes needed.

## 2022-12-04 ENCOUNTER — Telehealth: Payer: Self-pay | Admitting: Nurse Practitioner

## 2022-12-04 NOTE — Telephone Encounter (Signed)
Patient called back for lab results. Results given and patient verbal understanding. 

## 2022-12-06 ENCOUNTER — Telehealth: Payer: Self-pay

## 2022-12-06 NOTE — Telephone Encounter (Signed)
Pt is a referral and has been scheduled for 12/21/22 with Isabelle Course, she called triage and said the last two days she has been miserable (vaginally) and she cannot wait until 5/2. She wants to know if Isabelle Course or [name] can see her tomorrow 4/18? Any openings?

## 2022-12-07 ENCOUNTER — Encounter: Payer: Self-pay | Admitting: Obstetrics and Gynecology

## 2022-12-07 ENCOUNTER — Ambulatory Visit (INDEPENDENT_AMBULATORY_CARE_PROVIDER_SITE_OTHER): Payer: Medicare Other | Admitting: Obstetrics and Gynecology

## 2022-12-07 VITALS — BP 122/64 | Ht 62.0 in | Wt 148.0 lb

## 2022-12-07 DIAGNOSIS — N898 Other specified noninflammatory disorders of vagina: Secondary | ICD-10-CM

## 2022-12-07 DIAGNOSIS — Z8052 Family history of malignant neoplasm of bladder: Secondary | ICD-10-CM | POA: Diagnosis not present

## 2022-12-07 DIAGNOSIS — N941 Unspecified dyspareunia: Secondary | ICD-10-CM | POA: Diagnosis not present

## 2022-12-07 DIAGNOSIS — R102 Pelvic and perineal pain: Secondary | ICD-10-CM | POA: Diagnosis not present

## 2022-12-07 LAB — POCT URINALYSIS DIPSTICK
Bilirubin, UA: NEGATIVE
Blood, UA: NEGATIVE
Glucose, UA: NEGATIVE
Ketones, UA: NEGATIVE
Leukocytes, UA: NEGATIVE
Nitrite, UA: NEGATIVE
Protein, UA: NEGATIVE
Spec Grav, UA: 1.025 (ref 1.010–1.025)
pH, UA: 6 (ref 5.0–8.0)

## 2022-12-07 MED ORDER — ESTRADIOL 0.1 MG/GM VA CREA: TOPICAL_CREAM | VAGINAL | 1 refills | Status: DC

## 2022-12-07 NOTE — Patient Instructions (Signed)
I value your feedback and you entrusting us with your care. If you get a [facility] patient survey, I would appreciate you taking the time to let us know about your experience today. Thank you! ? ? ?

## 2022-12-07 NOTE — Progress Notes (Signed)
Lindsey Skiff, NP   Chief Complaint  Patient presents with   Pelvic Pain    X 6 months   Vaginal odor    No discharge, itching or irritation x couple of months    HPI:      Lindsey Bautista is a 62 y.o. [id number] whose LMP was No LMP recorded. Patient has had a hysterectomy., presents today for persistent vaginal odor. Treated multiple times with flagyl (now allergic) and clindamycin but sx persist. Pt notices when pulls down underwear to change/urinate. Not fishy odor, no irritation. Wet preps have shown BV in past, no cultures done. PCP gave her Cleocin twice wkly but pt isn't currently doing this since not helpful in past. Does have vag dryness/dyspareunia. Unable to have sex due to pain. No longer on ERT.  Hx of CVA/TIA.   Pt also with pelvic pain past 6 months. Pain feels like labor/cramping. Consistent for several days, then resolves. Also has RT groin pain/pressure with standing when pain starts. Notes lifting items can trigger sx. S/p hyst; neg CT abd/pelvis 3/24. Hx of hip arthritis bilat. FH bladder cancer in her mom and 2 mat aunts, they were smokers and pt is former smoker. Has concerns about cancer due to pain. No UTI sx. No hematuria.   Pt has noted nausea/decreased appetite. Diagnosed with hiatal hernia; no sx change with meds for 1 yr so stopped them. Hx of IBS-C and did linzess for awhile. Stopped meds and now having regular Bms.   11/11/19 NOTE: Hx of recurrent BV and atrophic vaginitis, referred by PCP. Pt notes an increased d/c with non-fishy odor, no vaginal itching/irritation; sx for past 6 months. Notices smell in her clothes. Washes with unscented soaps, no dryer sheets, has scented detergent, no wipes, no thongs, no bathbombs or douches. No urinary incont. Pos wet preps with PCP, no cultures sent. Has been treated multiple times with flagyl (now allergic with anaphylaxis), clindamycin orally and cleocin vag crm (last treated 10/31/19) with sx recurrence about a wk  later. Recently started probiotics. Has been on oral estradiol 1 mg for a few yrs, recently decreased to 0.5 mg dose, just started vaginal ERT twice wkly 12/20 without sx change.  Pt is not sex active. No vag bleeding/PMB. S/p hyst with BSO.   Patient Active Problem List   Diagnosis Date Noted   History of CVA in adulthood 08/30/2022   Family history of bladder cancer 03/07/2022   Allergy to alpha-gal 02/14/2022   Vitamin D deficiency 09/15/2021   Dietary vitamin B12 deficiency anemia 09/15/2021   Multiple joint pain 09/14/2021   Benzodiazepine dependence, continuous 09/29/2020   Barrett's esophagus 08/11/2020   Adrenal adenoma, right 09/15/2019   Atrophic vaginitis 08/01/2019   Advance care planning 04/16/2019   Bacterial vaginosis 11/18/2018   Slow transit constipation 11/18/2018   Coronary artery disease, non-occlusive 09/02/2018   Fibromyalgia 08/29/2017   Conversion disorder with mixed symptoms 03/30/2016   Aortic atherosclerosis 12/29/2015   Centrilobular emphysema 12/13/2015   Hyperlipidemia 10/22/2015   Cervical paraspinal muscle spasm 09/30/2015   Nicotine dependence, cigarettes, uncomplicated 04/21/2015   Depression, recurrent 12/19/2013   Anxiety 12/19/2013    Past Surgical History:  Procedure Laterality Date   CATARACT EXTRACTION W/PHACO Right 05/04/2022   Procedure: CATARACT EXTRACTION PHACO AND INTRAOCULAR LENS PLACEMENT (IOC) RIGHT 3.89 00:47.4;  Surgeon: Estanislado Pandy, MD;  Location: [facility];  Service: Ophthalmology;  Laterality: Right;   CATARACT EXTRACTION W/PHACO Left 05/18/2022  Procedure: CATARACT EXTRACTION PHACO AND INTRAOCULAR LENS PLACEMENT (IOC) LEFT 1.44 00:19.7;  Surgeon: Estanislado Pandy, MD;  Location: [facility];  Service: Ophthalmology;  Laterality: Left;   COLONOSCOPY WITH PROPOFOL N/A 11/22/2015   Procedure: COLONOSCOPY WITH PROPOFOL;  Surgeon: Scot Jun, MD;  Location: [facility] ENDOSCOPY;  Service: Endoscopy;   Laterality: N/A;   CORONARY PRESSURE/FFR STUDY N/A 06/19/2018   Procedure: INTRAVASCULAR PRESSURE WIRE/FFR STUDY;  Surgeon: Yvonne Kendall, MD;  Location: [facility] INV[facility] CV LAB;  Service: Cardiovascular;  Laterality: N/A;   ESOPHAGOGASTRODUODENOSCOPY (EGD) WITH PROPOFOL N/A 11/22/2015   Procedure: ESOPHAGOGASTRODUODENOSCOPY (EGD) WITH PROPOFOL;  Surgeon: Scot Jun, MD;  Location: [facility] ENDOSCOPY;  Service: Endoscopy;  Laterality: N/A;   LEFT HEART CATH AND CORONARY ANGIOGRAPHY N/A 06/19/2018   Procedure: LEFT HEART CATH AND CORONARY ANGIOGRAPHY;  Surgeon: Yvonne Kendall, MD;  Location: [facility] INVASIVE CV LAB;  Service: Cardiovascular;  Laterality: N/A;   OOPHORECTOMY     TONSILLECTOMY     VAGINAL HYSTERECTOMY      Family History  Problem Relation Age of Onset   Bladder Cancer Mother 61   Heart failure Mother    Renal Disease Mother    COPD Father    Bladder Cancer Maternal Aunt    Bladder Cancer Maternal Aunt    Breast cancer Maternal Aunt    Breast cancer Paternal Aunt 39   Colon cancer Maternal Grandmother    Cancer Paternal Grandfather        unk primary, metastatic, d 56s   Heart disease Other    COPD Other    Stroke Other    Prostate cancer Cousin        metastatic   Prostate cancer Cousin     Social History   Socioeconomic History   Marital status: Married    Spouse name: Neena Rhymes Birden   Number of children: 2   Years of education: 12   Highest education level: High school graduate  Occupational History   Occupation: Unemployed  Tobacco Use   Smoking status: Every Day    Packs/day: 0.75    Years: 45.00    Additional pack years: 0.00    Total pack years: 33.75    Types: Cigarettes   Smokeless tobacco: Never   Tobacco comments:    Started smoking age 53  Vaping Use   Vaping Use: Never used  Substance and Sexual Activity   Alcohol use: No    Alcohol/week: 0.0 standard drinks of alcohol   Drug use: No   Sexual activity: Not Currently    Birth  control/protection: Surgical    Comment: Hysterectomy  Other Topics Concern   Not on file  Social History Narrative   Lives at home with husband.    Caffeine use: 1 cup coffee per day   Social Determinants of Health   Financial Resource Strain: Low Risk  (10/09/2022)   Overall Financial Resource Strain (CARDIA)    Difficulty of Paying Living Expenses: Not hard at all  Food Insecurity: No Food Insecurity (11/04/2022)   Hunger Vital Sign    Worried About Running Out of Food in the Last Year: Never true    Ran Out of Food in the Last Year: Never true  Transportation Needs: No Transportation Needs (11/04/2022)   PRAPARE - Administrator, Civil Service (Medical): No    Lack of Transportation (Non-Medical): No  Physical Activity: Inactive (10/09/2022)   Exercise Vital Sign    Days of Exercise per Week: 0 days  Minutes of Exercise per Session: 0 min  Stress: No Stress Concern Present (10/09/2022)   [employer] of Occupational Health - Occupational Stress Questionnaire    Feeling of Stress : Only a little  Social Connections: Moderately Isolated (10/09/2022)   Social Connection and Isolation Panel [NHANES]    Frequency of Communication with Friends and Family: Once a week    Frequency of Social Gatherings with Friends and Family: Twice a week    Attends Religious Services: Never    Database administrator or Organizations: No    Attends Banker Meetings: Never    Marital Status: Married  Catering manager Violence: Not At Risk (11/04/2022)   Humiliation, Afraid, Rape, and Kick questionnaire    Fear of Current or Ex-Partner: No    Emotionally Abused: No    Physically Abused: No    Sexually Abused: No    Outpatient Medications Prior to Visit  Medication Sig Dispense Refill   atorvastatin (LIPITOR) 80 MG tablet Take 1 tablet by mouth once daily 90 tablet 4   [START ON 12/18/2022] clonazePAM (KLONOPIN) 1 MG tablet Take 1 tablet (1 mg total) by mouth 2 (two)  times daily. 60 tablet 2   dimenhyDRINATE (DRAMAMINE) 50 MG tablet Take 50 mg by mouth every 8 (eight) hours as needed.     docusate sodium (COLACE) 100 MG capsule Take 100 mg by mouth daily.      EPINEPHrine 0.3 mg/0.3 mL IJ SOAJ injection Inject 0.3 mg into the muscle as needed for anaphylaxis. 1 each 0   ezetimibe (ZETIA) 10 MG tablet Take 1 tablet by mouth once daily 90 tablet 4   FLUoxetine (PROZAC) 40 MG capsule Take 1 capsule by mouth once daily 90 capsule 4   nitroGLYCERIN (NITROSTAT) 0.4 MG SL tablet Place 1 tablet (0.4 mg total) under the tongue every 5 (five) minutes as needed for chest pain. 50 tablet 3   clindamycin (CLEOCIN) 2 % vaginal cream Apply one application twice a week to vaginal area for recurrent BV infections.  Do this regimen for the next 6 months. (Patient not taking: Reported on 12/07/2022) 40 g 5   ibuprofen (ADVIL) 800 MG tablet Take 800 mg by mouth every 8 (eight) hours as needed.     omeprazole (PRILOSEC) 40 MG capsule Take by mouth.     No facility-administered medications prior to visit.      ROS:  Review of Systems  Constitutional:  Negative for fever.  Gastrointestinal:  Positive for nausea. Negative for blood in stool, constipation, diarrhea and vomiting.  Genitourinary:  Positive for dyspareunia and pelvic pain. Negative for dysuria, flank pain, frequency, hematuria, urgency, vaginal bleeding, vaginal discharge and vaginal pain.  Musculoskeletal:  Negative for back pain.  Skin:  Negative for rash.   BREAST: No symptoms   OBJECTIVE:   Vitals:  BP 122/64   Ht 5\' 2"  (1.575 m)   Wt 148 lb (67.1 kg)   BMI 27.07 kg/m   Physical Exam Vitals reviewed.  Constitutional:      Appearance: She is well-developed.  Pulmonary:     Effort: Pulmonary effort is normal.  Abdominal:     Palpations: Abdomen is soft.     Tenderness: There is abdominal tenderness in the right lower quadrant, suprapubic area and left lower quadrant. There is no guarding or  rebound.  Genitourinary:    General: Normal vulva.     Pubic Area: No rash.      Labia:  Right: No rash, tenderness or lesion.        Left: No rash, tenderness or lesion.      Vagina: Normal. No vaginal discharge, erythema or tenderness.     Uterus: Tender. Not enlarged.      Adnexa:        Right: Tenderness present.        Left: Tenderness present.      Comments: MOD VAG ATROPHY Musculoskeletal:        General: Normal range of motion.     Cervical back: Normal range of motion.  Skin:    General: Skin is warm and dry.  Neurological:     General: No focal deficit present.     Mental Status: She is alert and oriented to person, place, and time.  Psychiatric:        Mood and Affect: Mood normal.        Behavior: Behavior normal.        Thought Content: Thought content normal.        Judgment: Judgment normal.     Results: Results for orders placed or performed in visit on 12/07/22 (from the past 24 hour(s))  POCT Urinalysis Dipstick     Status: Normal   Collection Time: 12/07/22  5:18 PM  Result Value Ref Range   Color, UA pale    Clarity, UA clear    Glucose, UA Negative Negative   Bilirubin, UA neg    Ketones, UA neg    Spec Grav, UA 1.025 1.010 - 1.025   Blood, UA neg    pH, UA 6.0 5.0 - 8.0   Protein, UA Negative Negative   Urobilinogen, UA     Nitrite, UA neg    Leukocytes, UA Negative Negative   Appearance     Odor       Assessment/Plan: Vaginal odor - Plan: estradiol (ESTRACE VAGINAL) 0.1 MG/GM vaginal cream, POCT Urinalysis Dipstick, Other/Misc lab test; check culture given +/- wet preps in past. Will f/u with results. If neg, not infectious. Regardless, try vag ERT in meantime to help with vaginal health.   Dyspareunia in female - Plan: estradiol (ESTRACE VAGINAL) 0.1 MG/GM vaginal cream; add vag ERT, Rx eRxd. Study in Menopause journal 12/18 showed no increase risk of CVD with vag ERT. Will see how sx are at culture f/u. If no change, pt to stop Rx.    Pelvic pain - Plan: Ambulatory referral to Physical Therapy, Ambulatory referral to Urology; question MSK given sx/hx. Sx triggered with lifting. Stop lifting, refer to pelvic PT. Could also be due to hip arthritis.   Family history of bladder cancer - Plan: Ambulatory referral to Urology; refer to urology given FH and pt's former smoking status and concern about bladder cancer.     Meds ordered this encounter  Medications   estradiol (ESTRACE VAGINAL) 0.1 MG/GM vaginal cream    Sig: Place 1 g vaginally nightly for 7 nights, then once weekly as maitenance    Dispense:  42.5 g    Refill:  1    Order Specific Question:   Supervising Provider    Answer:   Hildred Laser [[id number]]      Return if symptoms worsen or fail to improve.  Jazion Atteberry B. Merl Guardino, PA-C 12/07/2022 5:19 PM

## 2022-12-11 ENCOUNTER — Telehealth: Payer: Self-pay

## 2022-12-11 NOTE — Telephone Encounter (Signed)
Per [name]:  Call pt and confirm history of stroke. If so, ask her to use vaginal estrogen cream pea sized amount externally for now until ABC returns and calls her with culture results.  Called pt, no answer, voicemail not set up.

## 2022-12-11 NOTE — Telephone Encounter (Signed)
Pt confirmed hx of stroke and aware to use pea sized amount of vag cream until culture results come back.

## 2022-12-19 ENCOUNTER — Telehealth: Payer: Self-pay | Admitting: Obstetrics and Gynecology

## 2022-12-19 NOTE — Telephone Encounter (Signed)
MDL culture for vaginal odor showed prevotella bivia, apotobium, and strep anginosus, as well as lactobacillus crispatus (AV, although neg AV result overall). Neg for gardnerella, BVAB 1-3, GBS, myco and ureaplasma. Pt can't have flagyl due to allergy. Clindamycin is best tx option. Will also due avelox due to AV bacteria and pt is sx. Will await pt's phone call.  Needs to do probiotics.

## 2022-12-20 NOTE — Telephone Encounter (Signed)
Pt returning your missed call. Said her phone has been acting up but it's working now.

## 2022-12-21 ENCOUNTER — Encounter: Payer: Medicare Other | Admitting: Licensed Practical Nurse

## 2022-12-21 MED ORDER — CLINDAMYCIN HCL 300 MG PO CAPS: 300.0000 mg | ORAL_CAPSULE | Freq: Two times a day (BID) | ORAL | 0 refills | Status: AC

## 2022-12-21 NOTE — Telephone Encounter (Signed)
Pt aware of BV results. Will do Rx clindamycin BID for 7 days, then pt to resume vaginal clindamycin (Rxd by PCP) Q3 days for several months as preventive. Pt can't have flagyl due to allergy. Pt to start probiotics daily. Can do vag ERT externally a few times wkly to help with vag estrogen levels/atrophy but don't insert 1 g dosing due to hx of CVA (minimal to no systemic estrogen exposure with minimal ext crm dosing).   IF sx recur, will treat with avelox for possible AV etiology. Pt could also do boric acid vag supp QHS.  Pt to f/u if sx recur for tx change.

## 2023-01-23 ENCOUNTER — Ambulatory Visit (INDEPENDENT_AMBULATORY_CARE_PROVIDER_SITE_OTHER): Payer: Medicare Other | Admitting: Urology

## 2023-01-23 VITALS — BP 149/77 | HR 73 | Ht 61.0 in

## 2023-01-23 DIAGNOSIS — B9689 Other specified bacterial agents as the cause of diseases classified elsewhere: Secondary | ICD-10-CM

## 2023-01-23 DIAGNOSIS — Z8742 Personal history of other diseases of the female genital tract: Secondary | ICD-10-CM

## 2023-01-23 DIAGNOSIS — Z720 Tobacco use: Secondary | ICD-10-CM | POA: Diagnosis not present

## 2023-01-23 DIAGNOSIS — R109 Unspecified abdominal pain: Secondary | ICD-10-CM

## 2023-01-23 DIAGNOSIS — R102 Pelvic and perineal pain: Secondary | ICD-10-CM

## 2023-01-23 DIAGNOSIS — Z8052 Family history of malignant neoplasm of bladder: Secondary | ICD-10-CM

## 2023-01-23 DIAGNOSIS — Z716 Tobacco abuse counseling: Secondary | ICD-10-CM

## 2023-01-23 DIAGNOSIS — Z8551 Personal history of malignant neoplasm of bladder: Secondary | ICD-10-CM | POA: Diagnosis not present

## 2023-01-23 LAB — MICROSCOPIC EXAMINATION: Epithelial Cells (non renal): >10 /hpf — AB (ref 0–10)

## 2023-01-23 LAB — URINALYSIS, COMPLETE
Bilirubin, UA: NEGATIVE
Glucose, UA: NEGATIVE
Ketones, UA: NEGATIVE
Leukocytes,UA: NEGATIVE
Nitrite, UA: NEGATIVE
Protein,UA: NEGATIVE
RBC, UA: NEGATIVE
Specific Gravity, UA: 1.025 (ref 1.005–1.030)
Urobilinogen, Ur: 0.2 mg/dL (ref 0.2–1.0)
pH, UA: 6 (ref 5.0–7.5)

## 2023-01-23 NOTE — Patient Instructions (Signed)

## 2023-01-23 NOTE — Progress Notes (Signed)
I,Amy L Pierron,acting as a scribe for Vanna Scotland, MD.,have documented all relevant documentation on the behalf of Vanna Scotland, MD,as directed by  Vanna Scotland, MD while in the presence of Vanna Scotland, MD.  01/23/2023 3:22 PM   Lindsey Bautista Lindsey Bautista, Lindsey Bautista [phone]  Referring provider: Rica Records, PA-C [address] [place],  [place] [address]  Chief Complaint  Patient presents with   New Patient (Initial Visit)    HPI: 62 year-old female with a personal history of pelvic pain and recurrent bacterial vaginosis presents today due to being concerned about bladder cancer.  She has a family history (mother and others) of bladder cancer and tobacco use. She also was a smoker but quit 10 years ago.   She has had multiple negative urinalysis'. She had one in March that showed very few white blood cells and a lot of epithelial cells at that time. It looked contaminated.  Her UA today is negative other than greater than 10 epithelial cells.   For the past year she has had a few bacterial vaginosis occurences. She reports back pain which runs down to her toes, pain in her abdomen, and a painful spot on her buttock. She said her stomach is distended and she feels pressure in her groin.   She feels she empties well. Denies hematuria and leakage. PA Levin Erp suggested she be seen here because of her family history of bladder cancer and her history of smoking. She is concerned about having cancer as well. She smokes a pack a day for over 30 years and has not completely quit. She has cut back but the pain has made it hard for her.   A symptom of her thinking she has bacterial vaginosis is odor.   She was told she has a hiatal hernia.    PMH: Past Medical History:  Diagnosis Date   Arthritis    Atypical chest pain    a. 08/2016 Neg MV.   COPD (chronic obstructive pulmonary disease) (HCC)    Coronary artery disease, non-occlusive    a.  LHC 06/19/2018: Mid LAD 50%  with iFR 0.94   Depression    Diastolic dysfunction    a. 10/2016 Echo: EF 60-65%, no rwma, Gr1 DD, mild MR, nl LA size, nl RV fxn; b. 05/2018 Echo: EF 60-65%. No rwma. Mild MR. Nl RV fxn.   Fibromyalgia    GERD (gastroesophageal reflux disease)    Hypotension    IBS (irritable bowel syndrome)    Right arm weakness    Situational syncope    Stroke (HCC)    2013, 2018.  Some short term memory issues, some weakness   TIA (transient ischemic attack)    Wears dentures    full upper    Surgical History: Past Surgical History:  Procedure Laterality Date   CATARACT EXTRACTION W/PHACO Right 05/04/2022   Procedure: CATARACT EXTRACTION PHACO AND INTRAOCULAR LENS PLACEMENT (IOC) RIGHT 3.89 00:47.4;  Surgeon: Estanislado Pandy, MD;  Location: [facility];  Service: Ophthalmology;  Laterality: Right;   CATARACT EXTRACTION W/PHACO Left 9/Bautista/2023   Procedure: CATARACT EXTRACTION PHACO AND INTRAOCULAR LENS PLACEMENT (IOC) LEFT 1.44 00:19.7;  Surgeon: Estanislado Pandy, MD;  Location: [facility];  Service: Ophthalmology;  Laterality: Left;   COLONOSCOPY WITH PROPOFOL N/A 11/22/2015   Procedure: COLONOSCOPY WITH PROPOFOL;  Surgeon: Scot Jun, MD;  Location: [facility] ENDOSCOPY;  Service: Endoscopy;  Laterality: N/A;   CORONARY PRESSURE/FFR STUDY N/A 06/19/2018   Procedure: INTRAVASCULAR PRESSURE  WIRE/FFR STUDY;  Surgeon: Yvonne Kendall, MD;  Location: [facility] INV[facility] CV LAB;  Service: Cardiovascular;  Laterality: N/A;   ESOPHAGOGASTRODUODENOSCOPY (EGD) WITH PROPOFOL N/A 11/22/2015   Procedure: ESOPHAGOGASTRODUODENOSCOPY (EGD) WITH PROPOFOL;  Surgeon: Scot Jun, MD;  Location: [facility];  Service: Endoscopy;  Laterality: N/A;   LEFT HEART CATH AND CORONARY ANGIOGRAPHY N/A 06/19/2018   Procedure: LEFT HEART CATH AND CORONARY ANGIOGRAPHY;  Surgeon: Yvonne Kendall, MD;  Location: [facility] INVAS[facility] CV LAB;  Service: Cardiovascular;  Laterality: N/A;   OOPHORECTOMY      TONSILLECTOMY     VAGINAL HYSTERECTOMY      Home Medications:  Allergies as of 01/23/2023       Reactions   Amoxicillin Anaphylaxis   Facial swelling and SOB   Flagyl [metronidazole] Anaphylaxis   Penicillin G Anaphylaxis, Other (See Comments)   Has patient had a PCN reaction causing immediate rash, facial/tongue/throat swelling, SOB or lightheadedness with hypotension: [id number]} Has patient had a PCN reaction causing severe rash involving mucus membranes or skin necrosis: no:[id number]} Has patient had a PCN reaction that required hospitalization no:[id number]} Has patient had a PCN reaction occurring within the last 10 years: [id number]} If all of the above answers are "NO", then may proceed with Cephalosporin use.   Iodinated Contrast Media Rash, Nausea And Vomiting   Alpha-gal    Flomax [tamsulosin Hcl] Hives   Per ER visit 03/11/19        Medication List        Accurate as of January 23, 2023  3:22 PM. If you have any questions, ask your nurse or doctor.          STOP taking these medications    clindamycin 2 % vaginal cream Commonly known as: Cleocin   estradiol 0.1 MG/GM vaginal cream Commonly known as: ESTRACE VAGINAL       TAKE these medications    atorvastatin 80 MG tablet Commonly known as: LIPITOR Take 1 tablet by mouth once daily   clonazePAM 1 MG tablet Commonly known as: KLONOPIN Take 1 tablet (1 mg total) by mouth 2 (two) times daily.   dimenhyDRINATE 50 MG tablet Commonly known as: DRAMAMINE Take 50 mg by mouth every 8 (eight) hours as needed.   docusate sodium 100 MG capsule Commonly known as: COLACE Take 100 mg by mouth daily.   EPINEPHrine 0.3 mg/0.3 mL Soaj injection Commonly known as: EPI-PEN Inject 0.3 mg into the muscle as needed for anaphylaxis.   ezetimibe 10 MG tablet Commonly known as: ZETIA Take 1 tablet by mouth once daily   FLUoxetine 40 MG capsule Commonly known as: PROZAC Take 1 capsule by mouth once daily    nitroGLYCERIN 0.4 MG SL tablet Commonly known as: NITROSTAT Place 1 tablet (0.4 mg total) under the tongue every 5 (five) minutes as needed for chest pain.        Allergies:  Allergies  Allergen Reactions   Amoxicillin Anaphylaxis    Facial swelling and SOB   Flagyl [Metronidazole] Anaphylaxis   Penicillin G Anaphylaxis and Other (See Comments)    Has patient had a PCN reaction causing immediate rash, facial/tongue/throat swelling, SOB or lightheadedness with hypotension: [id number]} Has patient had a PCN reaction causing severe rash involving mucus membranes or skin necrosis: no:[id number]} Has patient had a PCN reaction that required hospitalization no:[id number]} Has patient had a PCN reaction occurring within the last 10 years: [id number]} If all of the above answers are "NO", then may proceed with Cephalosporin use.    Iodinated  Contrast Media Rash and Nausea And Vomiting   Alpha-Gal    Flomax [Tamsulosin Hcl] Hives    Per ER visit 03/11/19    Family History: Family History  Problem Relation Age of Onset   Bladder Cancer Mother 38   Heart failure Mother    Renal Disease Mother    COPD Father    Bladder Cancer Maternal Aunt    Bladder Cancer Maternal Aunt    Breast cancer Maternal Aunt    Breast cancer Paternal Aunt 22   Colon cancer Maternal Grandmother    Cancer Paternal Grandfather        unk primary, metastatic, d 6s   Heart disease Other    COPD Other    Stroke Other    Prostate cancer Cousin        metastatic   Prostate cancer Cousin     Social History:  reports that she has been smoking cigarettes. She has a 33.75 pack-year smoking history. She has never used smokeless tobacco. She reports that she does not drink alcohol and does not use drugs.   Physical Exam: BP (!) 149/77   Pulse 73   Ht 5\' 1"  (1.549 m)   BMI 27.96 kg/m   Constitutional:  Alert and oriented, No acute distress. HEENT: [address] AT, moist mucus membranes.  Trachea midline, no  masses. Neurologic: Grossly intact, no focal deficits, moving all 4 extremities. Psychiatric: Normal mood and affect.  Assessment & Plan:    History of bacterial vaginosis  - Likely not related to the bladder. Denies any urinary issues. Multiple negative UA's.   - Explained she most likely does not have bladder cancer at this time. However, due to family history and smoking history, we could do a cystoscopy for a more definitive look. She would like to have this done to give her peace of mind.  2. Smoking cessation  - Discussed smoking causes bladder cancer and it is highly encouraged for her to quit.  Return in about 4 weeks (around 02/20/2023) for cystoscopy.   [facility] [address], [address] [place], [place] [address] [phone]

## 2023-02-12 DIAGNOSIS — K30 Functional dyspepsia: Secondary | ICD-10-CM | POA: Diagnosis not present

## 2023-02-12 DIAGNOSIS — R1031 Right lower quadrant pain: Secondary | ICD-10-CM | POA: Diagnosis not present

## 2023-02-12 DIAGNOSIS — K219 Gastro-esophageal reflux disease without esophagitis: Secondary | ICD-10-CM | POA: Diagnosis not present

## 2023-02-12 DIAGNOSIS — R0789 Other chest pain: Secondary | ICD-10-CM | POA: Diagnosis not present

## 2023-02-12 DIAGNOSIS — R110 Nausea: Secondary | ICD-10-CM | POA: Diagnosis not present

## 2023-02-13 ENCOUNTER — Telehealth: Payer: Self-pay | Admitting: Obstetrics and Gynecology

## 2023-02-13 MED ORDER — MOXIFLOXACIN HCL 400 MG PO TABS: 400.0000 mg | ORAL_TABLET | Freq: Every day | ORAL | 0 refills | Status: AC

## 2023-02-13 NOTE — Telephone Encounter (Signed)
Pt having vag d/c and odor again. Treated with clindamycin orally for 7 days with sx improvement. Still doing clindamycin vag Q3 days (from PCP). Did probiotics but ran out, plans to get more today. MDL culture showed AV bacteria and several BV bacteria (see note below). Will treat with avelox since still doing clindamycin tx Q3 days. F/u prn.   Still having pelvic pain/LBP, radiating to RT leg. S/p hyst, neg CT abd/pelvis 3/24. Takes ibup without relief. Waking her up at night. Pt with hx of arthritis. Instructed pt to f/u with PCP re: pain. BV wouldn't cause this amount of pain. We're just treating BV for vag d/c and odor sx. Also seeing urology due to FH bladder cancer and pt's concern about that.   Meds ordered this encounter  Medications   moxifloxacin (AVELOX) 400 MG tablet    Sig: Take 1 tablet (400 mg total) by mouth daily at 8 pm for 6 days.    Dispense:  6 tablet    Refill:  0    Order Specific Question:   Supervising Provider    Answer:   Hildred Laser [[id number]]     12/21/22 Pt aware of BV results. Will do Rx clindamycin BID for 7 days, then pt to resume vaginal clindamycin (Rxd by PCP) Q3 days for several months as preventive. Pt can't have flagyl due to allergy. Pt to start probiotics daily. Can do vag ERT externally a few times wkly to help with vag estrogen levels/atrophy but don't insert 1 g dosing due to hx of CVA (minimal to no systemic estrogen exposure with minimal ext crm dosing).    IF sx recur, will treat with avelox for possible AV etiology. Pt could also do boric acid vag supp QHS.  Pt to f/u if sx recur for tx change.   12/19/22 MDL culture for vaginal odor showed prevotella bivia, apotobium, and strep anginosus, as well as lactobacillus crispatus (AV, although neg AV result overall). Neg for gardnerella, BVAB 1-3, GBS, myco and ureaplasma. Pt can't have flagyl due to allergy. Clindamycin is best tx option. Will also due avelox due to AV bacteria and pt is sx. Will await  pt's phone call.  Needs to do probiotics.

## 2023-02-13 NOTE — Telephone Encounter (Signed)
Patient called this morning stating that she saw you a couple months ago. She is having the same issue again. She stated that you may have an alternative for medication. She also said that if this occurs again to call to speak to your nurse. Please advise

## 2023-02-25 NOTE — Patient Instructions (Signed)
Managing Anxiety, Adult After being diagnosed with anxiety, you may be relieved to know why you have felt or behaved a certain way. You may also feel overwhelmed about the treatment ahead and what it will mean for your life. With care and support, you can manage your anxiety. How to manage lifestyle changes Understanding the difference between stress and anxiety Although stress can play a role in anxiety, it is not the same as anxiety. Stress is your body's reaction to life changes and events, both good and bad. Stress is often caused by something external, such as a deadline, test, or competition. It normally goes away after the event has ended and will last just a few hours. But, stress can be ongoing and can lead to more than just stress. Anxiety is caused by something internal, such as imagining a terrible outcome or worrying that something will go wrong that will greatly upset you. Anxiety often does not go away even after the event is over, and it can become a long-term (chronic) worry. Lowering stress and anxiety Talk with your health care provider or a counselor to learn more about lowering anxiety and stress. They may suggest tension-reduction techniques, such as: Music. Spend time creating or listening to music that you enjoy and that inspires you. Mindfulness-based meditation. Practice being aware of your normal breaths while not trying to control your breathing. It can be done while sitting or walking. Centering prayer. Focus on a word, phrase, or sacred image that means something to you and brings you peace. Deep breathing. Expand your stomach and inhale slowly through your nose. Hold your breath for 3-5 seconds. Then breathe out slowly, letting your stomach muscles relax. Self-talk. Learn to notice and spot thought patterns that lead to anxiety reactions. Change those patterns to thoughts that feel peaceful. Muscle relaxation. Take time to tense muscles and then relax them. Choose a  tension-reduction technique that fits your lifestyle and personality. These techniques take time and practice. Set aside 5-15 minutes a day to do them. Specialized therapists can offer counseling and training in these techniques. The training to help with anxiety may be covered by some insurance plans. Other things you can do to manage stress and anxiety include: Keeping a stress diary. This can help you learn what triggers your reaction and then learn ways to manage your response. Thinking about how you react to certain situations. You may not be able to control everything, but you can control your response. Making time for activities that help you relax and not feeling guilty about spending your time in this way. Doing visual imagery. This involves imagining or creating mental pictures to help you relax. Practicing yoga. Through yoga poses, you can lower tension and relax.  Medicines Medicines for anxiety include: Antidepressant medicines. These are usually prescribed for long-term daily control. Anti-anxiety medicines. These may be added in severe cases, especially when panic attacks occur. When used together, medicines, psychotherapy, and tension-reduction techniques may be the most effective treatment. Relationships Relationships can play a big part in helping you recover. Spend more time connecting with trusted friends and family members. Think about going to couples counseling if you have a partner, taking family education classes, or going to family therapy. Therapy can help you and others better understand your anxiety. How to recognize changes in your anxiety Everyone responds differently to treatment for anxiety. Recovery from anxiety happens when symptoms lessen and stop interfering with your daily life at home or work. This may mean that you   will start to: Have better concentration and focus. Worry will interfere less in your daily thinking. Sleep better. Be less irritable. Have more  energy. Have improved memory. Try to recognize when your condition is getting worse. Contact your provider if your symptoms interfere with home or work and you feel like your condition is not improving. Follow these instructions at home: Activity Exercise. Adults should: Exercise for at least 150 minutes each week. The exercise should increase your heart rate and make you sweat (moderate-intensity exercise). Do strengthening exercises at least twice a week. Get the right amount and quality of sleep. Most adults need 7-9 hours of sleep each night. Lifestyle  Eat a healthy diet that includes plenty of vegetables, fruits, whole grains, low-fat dairy products, and lean protein. Do not eat a lot of foods that are high in fats, added sugars, or salt (sodium). Make choices that simplify your life. Do not use any products that contain nicotine or tobacco. These products include cigarettes, chewing tobacco, and vaping devices, such as e-cigarettes. If you need help quitting, ask your provider. Avoid caffeine, alcohol, and certain over-the-counter cold medicines. These may make you feel worse. Ask your pharmacist which medicines to avoid. General instructions Take over-the-counter and prescription medicines only as told by your provider. Keep all follow-up visits. This is to make sure you are managing your anxiety well or if you need more support. Where to find support You can get help and support from: Self-help groups. Online and community organizations. A trusted spiritual leader. Couples counseling. Family education classes. Family therapy. Where to find more information You may find that joining a support group helps you deal with your anxiety. The following sources can help you find counselors or support groups near you: Mental Health [place]: mentalhealthamerica.net Anxiety and Depression Association of [place] (ADAA): adaa.org National Alliance on Mental Illness (NAMI): nami.org Contact  a health care provider if: You have a hard time staying focused or finishing tasks. You spend many hours a day feeling worried about everyday life. You are very tired because you cannot stop worrying. You start to have headaches or often feel tense. You have chronic nausea or diarrhea. Get help right away if: Your heart feels like it is racing. You have shortness of breath. You have thoughts of hurting yourself or others. Get help right away if you feel like you may hurt yourself or others, or have thoughts about taking your own life. Go to your nearest emergency room or: Call 911. Call the National Suicide Prevention Lifeline at 1-800-273-8255 or 988. This is open 24 hours a day. Text the Crisis Text Line at 741741. This information is not intended to replace advice given to you by your health care provider. Make sure you discuss any questions you have with your health care provider. Document Revised: 05/16/2022 Document Reviewed: 11/28/2020 Elsevier Patient Education  2024 [employer]  

## 2023-02-27 ENCOUNTER — Other Ambulatory Visit: Payer: Medicare Other | Admitting: Urology

## 2023-03-01 ENCOUNTER — Ambulatory Visit (INDEPENDENT_AMBULATORY_CARE_PROVIDER_SITE_OTHER): Payer: Medicare Other | Admitting: Urology

## 2023-03-01 VITALS — BP 132/75 | HR 81 | Ht 61.0 in | Wt 148.1 lb

## 2023-03-01 DIAGNOSIS — R3129 Other microscopic hematuria: Secondary | ICD-10-CM

## 2023-03-01 DIAGNOSIS — B9689 Other specified bacterial agents as the cause of diseases classified elsewhere: Secondary | ICD-10-CM | POA: Diagnosis not present

## 2023-03-01 DIAGNOSIS — Z8551 Personal history of malignant neoplasm of bladder: Secondary | ICD-10-CM | POA: Diagnosis not present

## 2023-03-01 DIAGNOSIS — F17200 Nicotine dependence, unspecified, uncomplicated: Secondary | ICD-10-CM

## 2023-03-01 DIAGNOSIS — N760 Acute vaginitis: Secondary | ICD-10-CM | POA: Diagnosis not present

## 2023-03-01 DIAGNOSIS — Z87891 Personal history of nicotine dependence: Secondary | ICD-10-CM

## 2023-03-01 LAB — URINALYSIS, COMPLETE
Bilirubin, UA: NEGATIVE
Glucose, UA: NEGATIVE
Ketones, UA: NEGATIVE
Nitrite, UA: NEGATIVE
Protein,UA: NEGATIVE
RBC, UA: NEGATIVE
Specific Gravity, UA: 1.01 (ref 1.005–1.030)
Urobilinogen, Ur: 1 mg/dL (ref 0.2–1.0)
pH, UA: 7.5 (ref 5.0–7.5)

## 2023-03-01 LAB — MICROSCOPIC EXAMINATION

## 2023-03-01 NOTE — Progress Notes (Signed)
   03/01/23  CC:  Chief Complaint  Patient presents with   Cysto    HPI: 62 year-old female with a personal history of pelvic pain and recurrent bacterial vaginosis presents today due to being concerned about bladder cancer.   She has a family history (mother and others) of bladder cancer and tobacco use.  She cut back on smoking but he started smoking again, now about 3 cigarettes/day.  She was previously smoking much more.   She has had multiple negative urinalysis over the past few years.   Isolated urinalysis on 10/31/2021 that had 11-20 white blood cells, 6-10 red blood cells many bacteria however had 21-50 squamous cells and a follow-up urinalysis a few days later which was completely negative.  Urinalysis today does show 11-30 white blood cells per high-powered field but otherwise is unremarkable and she has no change in her urinary symptoms.    Blood pressure 132/75, pulse 81, height 5\' 1"  (1.549 m), weight 148 lb 2 oz (67.2 kg). NED. A&Ox3.   No respiratory distress   Abd soft, NT, ND Normal external genitalia with patent urethral meatus  Cystoscopy Procedure Note  Patient identification was confirmed, informed consent was obtained, and patient was prepped using Betadine solution.  Lidocaine jelly was administered per urethral meatus.    Procedure: - Flexible cystoscope introduced, without any difficulty.   - Thorough search of the bladder revealed:    normal urethral meatus    normal urothelium    no stones    no ulcers     no tumors    no urethral polyps    no trabeculation  - Ureteral orifices were normal in position and appearance.  Post-Procedure: - Patient tolerated the procedure well  Assessment/ Plan:  1. Microscopic hematuria Cystoscopy today was unremarkable  She had a unremarkable CT abdomen pelvis without contrast on 10/22/2021  She has never had gross hematuria and only had 1 episode of microscopic hematuria in the setting of a contaminated  sample.  As such, do not believe that further upper tract imaging is warranted.  Recommend following up as needed if she has any gross hematuria or recurrent microscopic hematuria. - Urinalysis, Complete  2. History of smoking Encourage smoking cessation again today - Urinalysis, Complete  F/u prn  Vanna Scotland, MD

## 2023-03-02 ENCOUNTER — Encounter: Payer: Self-pay | Admitting: Nurse Practitioner

## 2023-03-02 ENCOUNTER — Ambulatory Visit (INDEPENDENT_AMBULATORY_CARE_PROVIDER_SITE_OTHER): Payer: Medicare Other | Admitting: Nurse Practitioner

## 2023-03-02 VITALS — BP 118/70 | HR 57 | Temp 98.0°F | Ht 60.98 in | Wt 149.4 lb

## 2023-03-02 DIAGNOSIS — I700 Atherosclerosis of aorta: Secondary | ICD-10-CM | POA: Diagnosis not present

## 2023-03-02 DIAGNOSIS — F17210 Nicotine dependence, cigarettes, uncomplicated: Secondary | ICD-10-CM

## 2023-03-02 DIAGNOSIS — E782 Mixed hyperlipidemia: Secondary | ICD-10-CM | POA: Diagnosis not present

## 2023-03-02 DIAGNOSIS — F1320 Sedative, hypnotic or anxiolytic dependence, uncomplicated: Secondary | ICD-10-CM

## 2023-03-02 DIAGNOSIS — J432 Centrilobular emphysema: Secondary | ICD-10-CM

## 2023-03-02 DIAGNOSIS — F447 Conversion disorder with mixed symptom presentation: Secondary | ICD-10-CM

## 2023-03-02 DIAGNOSIS — F339 Major depressive disorder, recurrent, unspecified: Secondary | ICD-10-CM

## 2023-03-02 DIAGNOSIS — M797 Fibromyalgia: Secondary | ICD-10-CM

## 2023-03-02 DIAGNOSIS — Z8052 Family history of malignant neoplasm of bladder: Secondary | ICD-10-CM

## 2023-03-02 DIAGNOSIS — F419 Anxiety disorder, unspecified: Secondary | ICD-10-CM

## 2023-03-02 MED ORDER — CLONAZEPAM 1 MG PO TABS
1.0000 mg | ORAL_TABLET | Freq: Two times a day (BID) | ORAL | 2 refills | Status: DC
Start: 2023-03-14 — End: 2023-05-30

## 2023-03-02 MED ORDER — TIZANIDINE HCL 2 MG PO TABS: 2.0000 mg | ORAL_TABLET | Freq: Three times a day (TID) | ORAL | 1 refills | Status: DC | PRN

## 2023-03-02 MED ORDER — FLUOXETINE HCL 40 MG PO CAPS: 40.0000 mg | ORAL_CAPSULE | Freq: Every day | ORAL | 4 refills | Status: DC

## 2023-03-02 MED ORDER — EZETIMIBE 10 MG PO TABS: 10.0000 mg | ORAL_TABLET | Freq: Every day | ORAL | 4 refills | Status: DC

## 2023-03-02 MED ORDER — ATORVASTATIN CALCIUM 80 MG PO TABS: 80.0000 mg | ORAL_TABLET | Freq: Every day | ORAL | 4 refills | Status: DC

## 2023-03-02 NOTE — Progress Notes (Signed)
BP 118/70 (BP Location: Left Arm, Patient Position: Sitting, Cuff Size: Normal)   Pulse (!) 57   Temp 98 F (36.7 C) (Oral)   Ht 5' 0.98" (1.549 m)   Wt 149 lb 6.4 oz (67.8 kg)   SpO2 98%   BMI 28.24 kg/m    Subjective:    Patient ID: Lindsey Bautista, female    DOB: [birth year], 62 y.o.   MRN: [id number]  HPI: Lindsey Bautista is a 62 y.o. female  Chief Complaint  Patient presents with   Hyperlipidemia   COPD   Mood   Had visits with urology and cystoscopy recently due to her concerns for bladder cancer, as is present in family, work-up was negative and she is to return as needed.  HYPERLIPIDEMIA Continues on Atorvastatin and Zetia. History of CVA, >11 years ago.   Hyperlipidemia status: good compliance Satisfied with current treatment?  yes Side effects:  no Medication compliance: good compliance Supplements: none Aspirin:  yes The ASCVD Risk score (Arnett DK, et al., 2019) failed to calculate for the following reasons:   The patient has a prior MI or stroke diagnosis Chest pain:  no Coronary artery disease:  no Family history CAD:  yes Family history early CAD:  no   COPD Centrilobular & paraseptal emphysema and aortic atherosclerosis -- CT screening 10/11/22 and remains stable. No current inhalers.     Currently smoking <1 PPD, has smoked for >40 years.  Not interested in quitting.   COPD status: stable Satisfied with current treatment?: yes Oxygen use: no Dyspnea frequency: no Cough frequency: no Rescue inhaler frequency: none Limitation of activity: no Productive cough: none Last Spirometry: unknown Pneumovax:  refuses Influenza: Up to Date   FIBROMYALGIA, ALPHA GAL, B12 DEFICIENCY Ongoing joint pain all over from head to toe.  Saw rheumatology last 04/04/22.    Continues with GI issues, underlying Alpha Gal past labs -- was to see allergist but never attended.  Last saw GI 02/12/23 - they sent in Amitriptyline, however she has not started due to taking  Prozac and concerned for interactions per pharmacist.  They are considering esophageal manometry at [facility].   Had UGI in past noting small hiatal hernia.  Saw GYN on 12/07/22 -- was treated for infection and referred to urology. Duration: months Pain: yes Symmetric: yes -- R>L = 9/10 at worst when present Quality: dull, aching, and throbbing Frequency: constant Context:  worse Decreased function/range of motion: yes Erythema: none Swelling: yes -- right hand can not close it for a few hours after waking up Heat or warmth: none Morning stiffness: yes Aggravating factors: lifting heavy Alleviating factors: nothing Relief with NSAIDs?: No NSAIDs Taken Treatments attempted:  rest and heat  Involved Joints:     Hands: yes bilateral    Wrists: yes bilateral     Elbows: yes bilateral    Shoulders: yes bilateral    Back: yes     Hips: yes bilateral    Knees: yes bilateral    Ankles: yes bilateral    Feet: yes bilateral   DEPRESSION/ANXIETY  Continues on Prozac 40 MG daily and Klonopin 1 MG BID -- she would like to increase to 2 MG BID, we discussed she would need to see psychiatry for this.  Pt is aware of risks of benzo medication use to include increased sedation, respiratory suppression, falls, dependence and cardiovascular events.  Pt would like to continue treatment as benefit determined to outweigh risk -- has been on this  for many years.  PDMP review last Klonopin fill 02/13/23 for 1 MG tablets.  No other controlled substances noted.  She refuses psychiatry visits -- states she some them in past and knows she is "1/2 crazy".  Mood status: stable Satisfied with current treatment?: yes Symptom severity: mild  Duration of current treatment : chronic Side effects: no Medication compliance: good compliance Psychotherapy/counseling: none Previous psychiatric medications: Prozac and Klonopin Depressed mood: occasional Anxious mood: yes Anhedonia: no Significant weight loss or gain:  no Insomnia: yes hard to fall asleep -- endorses poor sleep pattern with hot flashes Fatigue: occasional Feelings of worthlessness or guilt: none Impaired concentration/indecisiveness: yes Suicidal ideations: no Hopelessness: no Crying spells: no    03/02/2023    1:23 PM 10/09/2022    1:10 PM 08/30/2022    2:59 PM 06/09/2022    1:40 PM 05/23/2022    1:37 PM  Depression screen PHQ 2/9  Decreased Interest 3 0 2 2 0  Down, Depressed, Hopeless 2 1 3 1  0  PHQ - 2 Score 5 1 5 3  0  Altered sleeping 2 0 1 2 0  Tired, decreased energy 3 1 3 2  0  Change in appetite 3 0 1 1 0  Feeling bad or failure about yourself  3 0 2 1 0  Trouble concentrating 2 1 1 2  0  Moving slowly or fidgety/restless 2 0 0 1 0  Suicidal thoughts 0 0 0 0 0  PHQ-9 Score 20 3 13 12  0  Difficult doing work/chores Very difficult Not difficult at all Very difficult Somewhat difficult Not difficult at all      03/02/2023    1:23 PM 08/30/2022    3:00 PM 06/09/2022    1:40 PM 05/23/2022    1:37 PM  GAD 7 : Generalized Anxiety Score  Nervous, Anxious, on Edge 3 3 1    Control/stop worrying 2 3 2  0  Worry too much - different things 2 3 2  0  Trouble relaxing 2 3 1  0  Restless 2 3 0 0  Easily annoyed or irritable 3 3 2  0  Afraid - awful might happen 3 3 1  0  Total GAD 7 Score 17 21 9    Anxiety Difficulty Very difficult Very difficult Somewhat difficult Not difficult at all   Relevant past medical, surgical, family and social history reviewed and updated as indicated. Interim medical history since our last visit reviewed. Allergies and medications reviewed and updated.  Review of Systems  Constitutional:  Negative for activity change, appetite change, diaphoresis, fatigue and fever.  Respiratory:  Negative for cough, chest tightness, shortness of breath and wheezing.   Cardiovascular:  Negative for chest pain, palpitations and leg swelling.  Gastrointestinal:  Positive for abdominal pain. Negative for abdominal  distention, constipation, diarrhea, nausea and vomiting.  Endocrine: Negative for cold intolerance and heat intolerance.  Musculoskeletal:  Positive for arthralgias.  Neurological: Negative.   Psychiatric/Behavioral:  Negative for decreased concentration, self-injury, sleep disturbance and suicidal ideas. The patient is nervous/anxious.    Per HPI unless specifically indicated above     Objective:    BP 118/70 (BP Location: Left Arm, Patient Position: Sitting, Cuff Size: Normal)   Pulse (!) 57   Temp 98 F (36.7 C) (Oral)   Ht 5' 0.98" (1.549 m)   Wt 149 lb 6.4 oz (67.8 kg)   SpO2 98%   BMI 28.24 kg/m   Wt Readings from Last 3 Encounters:  03/02/23 149 lb 6.4 oz (67.8 kg)  03/01/23 148 lb 2 oz (67.2 kg)  12/07/22 148 lb (67.1 kg)    Physical Exam Vitals and nursing note reviewed.  Constitutional:      General: She is awake. She is not in acute distress.    Appearance: She is well-developed, well-groomed and overweight. She is not ill-appearing or toxic-appearing.  HENT:     Head: Normocephalic.     Right Ear: Hearing and external ear normal.     Left Ear: Hearing and external ear normal.  Eyes:     General: Lids are normal.        Right eye: No discharge.        Left eye: No discharge.     Extraocular Movements: Extraocular movements intact.     Conjunctiva/sclera: Conjunctivae normal.     Pupils: Pupils are equal, round, and reactive to light.     Visual Fields: Right eye visual fields normal and left eye visual fields normal.  Neck:     Thyroid: No thyromegaly.     Vascular: No carotid bruit.  Cardiovascular:     Rate and Rhythm: Normal rate and regular rhythm.     Heart sounds: Normal heart sounds. No murmur heard.    No gallop.  Pulmonary:     Effort: Pulmonary effort is normal. No accessory muscle usage or respiratory distress.     Breath sounds: Normal breath sounds. No decreased breath sounds or wheezing.  Chest:  Breasts:    Right: Normal.     Left:  Normal.  Abdominal:     General: Bowel sounds are normal.     Palpations: Abdomen is soft.     Tenderness: There is no right CVA tenderness, left CVA tenderness, guarding or rebound.     Hernia: No hernia is present.  Musculoskeletal:     Cervical back: Normal range of motion and neck supple.     Right lower leg: No edema.     Left lower leg: No edema.  Lymphadenopathy:     Cervical: No cervical adenopathy.     Upper Body:     Right upper body: No supraclavicular, axillary or pectoral adenopathy.     Left upper body: No supraclavicular, axillary or pectoral adenopathy.  Skin:    General: Skin is warm and dry.  Neurological:     Mental Status: She is alert and oriented to person, place, and time.     Cranial Nerves: Cranial nerves 2-12 are intact.     Sensory: Sensation is intact.     Motor: Motor function is intact.     Coordination: Coordination is intact.     Gait: Gait is intact.     Deep Tendon Reflexes: Reflexes are normal and symmetric.     Reflex Scores:      Brachioradialis reflexes are 2+ on the right side and 2+ on the left side.      Patellar reflexes are 2+ on the right side and 2+ on the left side. Psychiatric:        Attention and Perception: Attention normal.        Mood and Affect: Mood normal.        Speech: Speech normal.        Behavior: Behavior normal. Behavior is cooperative.        Thought Content: Thought content normal.    Results for orders placed or performed in visit on 03/01/23  Microscopic Examination   Urine  Result Value Ref Range   WBC, UA 11-30 (A)  0 - 5 /hpf   RBC, Urine 0-2 0 - 2 /hpf   Epithelial Cells (non renal) 0-10 0 - 10 /hpf   Renal Epithel, UA 0-10 (A) None seen /hpf   Bacteria, UA Moderate (A) None seen/Few  Urinalysis, Complete  Result Value Ref Range   Specific Gravity, UA 1.010 1.005 - 1.030   pH, UA 7.5 5.0 - 7.5   Color, UA Yellow Yellow   Appearance Ur Hazy (A) Clear   Leukocytes,UA 1+ (A) Negative   Protein,UA  Negative Negative/Trace   Glucose, UA Negative Negative   Ketones, UA Negative Negative   RBC, UA Negative Negative   Bilirubin, UA Negative Negative   Urobilinogen, Ur 1.0 0.2 - 1.0 mg/dL   Nitrite, UA Negative Negative   Microscopic Examination See below:       Assessment & Plan:   Problem List Items Addressed This Visit       Cardiovascular and Mediastinum   Aortic atherosclerosis (HCC)    Chronic, ongoing, noted on October 2019 lung screening CT.  Continue statin and ASA daily + recommend complete cessation of smoking.      Relevant Medications   ezetimibe (ZETIA) 10 MG tablet   atorvastatin (LIPITOR) 80 MG tablet     Respiratory   Centrilobular emphysema (HCC) - Primary    Chronic, ongoing.  Last exacerbation 11/04/22.  No current inhaler regimen.  Denies any symptoms.  Continue yearly lung screening and plan on spirometry next visit to assess function.  Recommend complete cessation of smoking.    Spirometry unable to interpret today.      Relevant Orders   CBC with Differential/Platelet   Spirometry with Graph (Completed)     Other   Anxiety    Chronic, ongoing.  Continue Klonopin at this time, patient educated on risks of medication and is not interested in reducing -- refills sent in.  Discussed we would not increase this.  Continue Prozac 40 MG which benefits anxiety and fibromyalgia, discussed with her changing to Zoloft or Duloxetine (which would benefit chronic pain issues) if increase anxiety/depression noted -- refuses.  At this time she denies SI/HI and wishes to maintain current regimen.  UDS yearly (next 08/31/23) and contract up to date.  Return in 3 months.       Relevant Medications   FLUoxetine (PROZAC) 40 MG capsule   Benzodiazepine dependence, continuous (HCC)    Refer to anxiety plan for updates and discussion. UDS due 08/31/23.  Contract next visit.      Conversion disorder with mixed symptoms    Refer to anxiety plan of care.      Depression,  recurrent (HCC)    Chronic, ongoing.  Continue current medication regimen and adjust as needed.  Denies SI/HI.  Consider change from Prozac to Cymbalta in future, which would offer benefit with anxiety and depression + fibromyalgia/alpha gal -- she refuses this change today.  Very anxious about changes.  Referral to therapy placed in past, she never attended.      Relevant Medications   FLUoxetine (PROZAC) 40 MG capsule   Family history of bladder cancer    Saw urology with reassuring work-up, continue this collaboration as needed and recommend complete cessation of smoking.      Fibromyalgia    Chronic, ongoing.  Recommended she try stopping Prozac and change to Duloxetine which would benefit mood and pain, she refuses this - fearful of change.  Recommend we trial Tizanidine as needed, she is agreeable to this.  Script sent and educated her on medication.  Tried Gabapentin in past and did not like this.      Relevant Medications   tiZANidine (ZANAFLEX) 2 MG tablet   FLUoxetine (PROZAC) 40 MG capsule   Hyperlipidemia    Chronic, ongoing.  Continue current medication regimen and collaboration with cardiology.  Lipid panel today.      Relevant Medications   ezetimibe (ZETIA) 10 MG tablet   atorvastatin (LIPITOR) 80 MG tablet   Other Relevant Orders   Comprehensive metabolic panel   Lipid Panel w/o Chol/HDL Ratio   Nicotine dependence, cigarettes, uncomplicated    I have recommended complete cessation of tobacco use. I have discussed various options available for assistance with tobacco cessation including over the counter methods (Nicotine gum, patch and lozenges). We also discussed prescription options (Chantix, Nicotine Inhaler / Nasal Spray). The patient is not interested in pursuing any prescription tobacco cessation options at this time.         Follow up plan: Return in about 3 months (around 06/02/2023) for MOOD.

## 2023-03-02 NOTE — Assessment & Plan Note (Addendum)
Refer to anxiety plan for updates and discussion. UDS due 08/31/23.  Contract next visit.

## 2023-03-02 NOTE — Assessment & Plan Note (Signed)
Saw urology with reassuring work-up, continue this collaboration as needed and recommend complete cessation of smoking.

## 2023-03-02 NOTE — Assessment & Plan Note (Signed)
Chronic, ongoing.  Continue current medication regimen and adjust as needed.  Denies SI/HI.  Consider change from Prozac to Cymbalta in future, which would offer benefit with anxiety and depression + fibromyalgia/alpha gal -- she refuses this change today.  Very anxious about changes.  Referral to therapy placed in past, she never attended.

## 2023-03-02 NOTE — Assessment & Plan Note (Signed)
I have recommended complete cessation of tobacco use. I have discussed various options available for assistance with tobacco cessation including over the counter methods (Nicotine gum, patch and lozenges). We also discussed prescription options (Chantix, Nicotine Inhaler / Nasal Spray). The patient is not interested in pursuing any prescription tobacco cessation options at this time.  

## 2023-03-02 NOTE — Assessment & Plan Note (Signed)
Chronic, ongoing.  Last exacerbation 11/04/22.  No current inhaler regimen.  Denies any symptoms.  Continue yearly lung screening and plan on spirometry next visit to assess function.  Recommend complete cessation of smoking.    Spirometry unable to interpret today.

## 2023-03-02 NOTE — Assessment & Plan Note (Signed)
Chronic, ongoing.  Continue Klonopin at this time, patient educated on risks of medication and is not interested in reducing -- refills sent in.  Discussed we would not increase this.  Continue Prozac 40 MG which benefits anxiety and fibromyalgia, discussed with her changing to Zoloft or Duloxetine (which would benefit chronic pain issues) if increase anxiety/depression noted -- refuses.  At this time she denies SI/HI and wishes to maintain current regimen.  UDS yearly (next 08/31/23) and contract up to date.  Return in 3 months.

## 2023-03-02 NOTE — Assessment & Plan Note (Signed)
Chronic, ongoing.  Recommended she try stopping Prozac and change to Duloxetine which would benefit mood and pain, she refuses this - fearful of change.  Recommend we trial Tizanidine as needed, she is agreeable to this.  Script sent and educated her on medication.  Tried Gabapentin in past and did not like this.

## 2023-03-02 NOTE — Assessment & Plan Note (Signed)
Chronic, ongoing.  Continue current medication regimen and collaboration with cardiology.  Lipid panel today. 

## 2023-03-02 NOTE — Assessment & Plan Note (Signed)
Chronic, ongoing, noted on October 2019 lung screening CT.  Continue statin and ASA daily + recommend complete cessation of smoking. 

## 2023-03-02 NOTE — Assessment & Plan Note (Signed)
Refer to anxiety plan of care. 

## 2023-03-03 LAB — CBC WITH DIFFERENTIAL/PLATELET
Basophils Absolute: 0.1 10*3/uL (ref 0.0–0.2)
Basos: 1 %
EOS (ABSOLUTE): 0.1 10*3/uL (ref 0.0–0.4)
Eos: 1 %
Hematocrit: 39.5 % (ref 34.0–46.6)
Hemoglobin: 13.3 g/dL (ref 11.1–15.9)
Immature Grans (Abs): 0 10*3/uL (ref 0.0–0.1)
Immature Granulocytes: 0 %
Lymphocytes Absolute: 2 10*3/uL (ref 0.7–3.1)
Lymphs: 38 %
MCH: 30 pg (ref 26.6–33.0)
MCHC: 33.7 g/dL (ref 31.5–35.7)
MCV: 89 fL (ref 79–97)
Monocytes Absolute: 0.5 10*3/uL (ref 0.1–0.9)
Monocytes: 10 %
Neutrophils Absolute: 2.6 10*3/uL (ref 1.4–7.0)
Neutrophils: 50 %
Platelets: 233 10*3/uL (ref 150–450)
RBC: 4.43 x10E6/uL (ref 3.77–5.28)
RDW: 13.2 % (ref 11.7–15.4)
WBC: 5.2 10*3/uL (ref 3.4–10.8)

## 2023-03-03 LAB — COMPREHENSIVE METABOLIC PANEL
ALT: 21 IU/L (ref 0–32)
AST: 30 IU/L (ref 0–40)
Albumin: 4.2 g/dL (ref 3.9–4.9)
Alkaline Phosphatase: 94 IU/L (ref 44–121)
BUN/Creatinine Ratio: 12 (ref 12–28)
BUN: 10 mg/dL (ref 8–27)
Bilirubin Total: 0.5 mg/dL (ref 0.0–1.2)
CO2: 26 mmol/L (ref 20–29)
Calcium: 9.2 mg/dL (ref 8.7–10.3)
Chloride: 103 mmol/L (ref 96–106)
Creatinine, Ser: 0.85 mg/dL (ref 0.57–1.00)
Globulin, Total: 2.4 g/dL (ref 1.5–4.5)
Glucose: 82 mg/dL (ref 70–99)
Potassium: 4.5 mmol/L (ref 3.5–5.2)
Sodium: 142 mmol/L (ref 134–144)
Total Protein: 6.6 g/dL (ref 6.0–8.5)
eGFR: 78 mL/min/{1.73_m2} (ref >59)

## 2023-03-03 LAB — LIPID PANEL W/O CHOL/HDL RATIO
Cholesterol, Total: 129 mg/dL (ref 100–199)
HDL: 49 mg/dL (ref >39)
LDL Chol Calc (NIH): 66 mg/dL (ref 0–99)
Triglycerides: 68 mg/dL (ref 0–149)
VLDL Cholesterol Cal: 14 mg/dL (ref 5–40)

## 2023-03-03 NOTE — Progress Notes (Signed)
Contacted via MyChart, however please call as she rarely checks:  Good morning Lindsey Bautista, your labs have all returned and everything is nice and normal.  There are no medication changes needed.  Continue to follow with the GI providers and trial the muscle relaxer to see if benefit to your pain.  Any questions? Keep being amazing!!  Thank you for allowing me to participate in your care.  I appreciate you. Kindest regards, Jemiah Ellenburg

## 2023-05-27 NOTE — Patient Instructions (Signed)
Managing Anxiety, Adult  After being diagnosed with anxiety, you may be relieved to know why you have felt or behaved a certain way. You may also feel overwhelmed about the treatment ahead and what it will mean for your life. With care and support, you can manage your anxiety.  How to manage lifestyle changes  Understanding the difference between stress and anxiety  Although stress can play a role in anxiety, it is not the same as anxiety. Stress is your body's reaction to life changes and events, both good and bad. Stress is often caused by something external, such as a deadline, test, or competition. It normally goes away after the event has ended and will last just a few hours. But, stress can be ongoing and can lead to more than just stress.  Anxiety is caused by something internal, such as imagining a terrible outcome or worrying that something will go wrong that will greatly upset you. Anxiety often does not go away even after the event is over, and it can become a long-term (chronic) worry.  Lowering stress and anxiety    Talk with your health care provider or a counselor to learn more about lowering anxiety and stress. They may suggest tension-reduction techniques, such as:  Music. Spend time creating or listening to music that you enjoy and that inspires you.  Mindfulness-based meditation. Practice being aware of your normal breaths while not trying to control your breathing. It can be done while sitting or walking.  Centering prayer. Focus on a word, phrase, or sacred image that means something to you and brings you peace.  Deep breathing. Expand your stomach and inhale slowly through your nose. Hold your breath for 3-5 seconds. Then breathe out slowly, letting your stomach muscles relax.  Self-talk. Learn to notice and spot thought patterns that lead to anxiety reactions. Change those patterns to thoughts that feel peaceful.  Muscle relaxation. Take time to tense muscles and then relax them.  Choose a  tension-reduction technique that fits your lifestyle and personality. These techniques take time and practice. Set aside 5-15 minutes a day to do them. Specialized therapists can offer counseling and training in these techniques. The training to help with anxiety may be covered by some insurance plans.  Other things you can do to manage stress and anxiety include:  Keeping a stress diary. This can help you learn what triggers your reaction and then learn ways to manage your response.  Thinking about how you react to certain situations. You may not be able to control everything, but you can control your response.  Making time for activities that help you relax and not feeling guilty about spending your time in this way.  Doing visual imagery. This involves imagining or creating mental pictures to help you relax.  Practicing yoga. Through yoga poses, you can lower tension and relax.     Medicines  Medicines for anxiety include:  Antidepressant medicines. These are usually prescribed for long-term daily control.  Anti-anxiety medicines. These may be added in severe cases, especially when panic attacks occur.  When used together, medicines, psychotherapy, and tension-reduction techniques may be the most effective treatment.  Relationships  Relationships can play a big part in helping you recover. Spend more time connecting with trusted friends and family members. Think about going to couples counseling if you have a partner, taking family education classes, or going to family therapy. Therapy can help you and others better understand your anxiety.  How to recognize changes in  your anxiety  Everyone responds differently to treatment for anxiety. Recovery from anxiety happens when symptoms lessen and stop interfering with your daily life at home or work. This may mean that you will start to:  Have better concentration and focus. Worry will interfere less in your daily thinking.  Sleep better.  Be less irritable.  Have  more energy.  Have improved memory.  Try to recognize when your condition is getting worse. Contact your provider if your symptoms interfere with home or work and you feel like your condition is not improving.  Follow these instructions at home:  Activity  Exercise. Adults should:  Exercise for at least 150 minutes each week. The exercise should increase your heart rate and make you sweat (moderate-intensity exercise).  Do strengthening exercises at least twice a week.  Get the right amount and quality of sleep. Most adults need 7-9 hours of sleep each night.  Lifestyle    Eat a healthy diet that includes plenty of vegetables, fruits, whole grains, low-fat dairy products, and lean protein.  Do not eat a lot of foods that are high in fats, added sugars, or salt (sodium).  Make choices that simplify your life.  Do not use any products that contain nicotine or tobacco. These products include cigarettes, chewing tobacco, and vaping devices, such as e-cigarettes. If you need help quitting, ask your provider.  Avoid caffeine, alcohol, and certain over-the-counter cold medicines. These may make you feel worse. Ask your pharmacist which medicines to avoid.  General instructions  Take over-the-counter and prescription medicines only as told by your provider.  Keep all follow-up visits. This is to make sure you are managing your anxiety well or if you need more support.  Where to find support  You can get help and support from:  Self-help groups.  Online and [employer].  A trusted spiritual leader.  Couples counseling.  Family education classes.  Family therapy.  Where to find more information  You may find that joining a support group helps you deal with your anxiety. The following sources can help you find counselors or support groups near you:  Mental Health [place]: mentalhealthamerica.net  Anxiety and Depression Association of [place] (ADAA): adaa.org  [employer] Mental [employer]ness (NAMI):  nami.org  Contact a health care provider if:  You have a hard time staying focused or finishing tasks.  You spend many hours a day feeling worried about everyday life.  You are very tired because you cannot stop worrying.  You start to have headaches or often feel tense.  You have chronic nausea or diarrhea.  Get help right away if:  Your heart feels like it is racing.  You have shortness of breath.  You have thoughts of hurting yourself or others.  Get help right away if you feel like you may hurt yourself or others, or have thoughts about taking your own life. Go to your nearest emergency room or:  Call 911.  Call the National Suicide Prevention Lifeline at 715-005-1913 or 988. This is open 24 hours a day.  Text the Crisis Text Line at (250) 302-5959.  This information is not intended to replace advice given to you by your health care provider. Make sure you discuss any questions you have with your health care provider.  Document Revised: 05/16/2022 Document Reviewed: 11/28/2020  Elsevier Patient Education  2024 [employer].

## 2023-05-30 ENCOUNTER — Ambulatory Visit (INDEPENDENT_AMBULATORY_CARE_PROVIDER_SITE_OTHER): Payer: Medicare Other | Admitting: Nurse Practitioner

## 2023-05-30 ENCOUNTER — Encounter: Payer: Self-pay | Admitting: Nurse Practitioner

## 2023-05-30 VITALS — BP 116/80 | HR 62 | Temp 97.9°F | Wt 148.4 lb

## 2023-05-30 DIAGNOSIS — F447 Conversion disorder with mixed symptom presentation: Secondary | ICD-10-CM

## 2023-05-30 DIAGNOSIS — F1320 Sedative, hypnotic or anxiolytic dependence, uncomplicated: Secondary | ICD-10-CM | POA: Diagnosis not present

## 2023-05-30 DIAGNOSIS — F419 Anxiety disorder, unspecified: Secondary | ICD-10-CM | POA: Diagnosis not present

## 2023-05-30 DIAGNOSIS — F339 Major depressive disorder, recurrent, unspecified: Secondary | ICD-10-CM

## 2023-05-30 MED ORDER — CLONAZEPAM 1 MG PO TABS
1.0000 mg | ORAL_TABLET | Freq: Two times a day (BID) | ORAL | 2 refills | Status: DC
Start: 2023-06-08 — End: 2023-08-31

## 2023-05-30 NOTE — Progress Notes (Signed)
BP 116/80 (BP Location: Left Arm, Patient Position: Sitting, Cuff Size: Normal)   Pulse 62   Temp 97.9 F (36.6 C) (Oral)   Wt 148 lb 6.4 oz (67.3 kg)   SpO2 96%   BMI 28.05 kg/m    Subjective:    Patient ID: Lindsey Bautista, female    DOB: 10/07/1960, 62 y.o.   MRN: [id number]  HPI: Lindsey Bautista is a 62 y.o. female  Chief Complaint  Patient presents with   Anxiety   ANXIETY/STRESS Continues on Prozac 40 MG daily and Klonopin 1 MG BID.  Pt is aware of risks of benzo medication use to include increased sedation, respiratory suppression, falls, dependence and cardiovascular events.  Pt would like to continue treatment as benefit determined to outweigh risk -- has been on this for many years.  PDMP review last Klonopin fill 05/11/23 for 1 MG tablets.  No other controlled substances noted.    She refuses psychiatry visits, had bad experiences with them in the past. Has fibromyalgia  and has been helping a lot lately.  Has to stay in bed for 2 days sometimes due to pain.  She wants a scan from her neck to her legs as she continues to be concerned for cancer.  Had a reassuring work-up with urology, GYN, GI.   Duration: ongoing and chronic Anxious mood: yes  Excessive worrying: yes Irritability: yes  Sweating: no Nausea: no Palpitations:no Hyperventilation: no Panic attacks: no Agoraphobia: no  Obscessions/compulsions: no Depressed mood: yes    2023/06/29    1:17 PM 03/02/2023    1:23 PM 10/09/2022    1:10 PM 08/30/2022    2:59 PM 06/09/2022    1:40 PM  Depression screen PHQ 2/9  Decreased Interest 2 3 0 2 2  Down, Depressed, Hopeless 2 2 1 3 1   PHQ - 2 Score 4 5 1 5 3   Altered sleeping 2 2 0 1 2  Tired, decreased energy 3 3 1 3 2   Change in appetite 2 3 0 1 1  Feeling bad or failure about yourself  2 3 0 2 1  Trouble concentrating 2 2 1 1 2   Moving slowly or fidgety/restless 0 2 0 0 1  Suicidal thoughts 0 0 0 0 0  PHQ-9 Score 15 20 3 13 12   Difficult doing work/chores  Very difficult Very difficult Not difficult at all Very difficult Somewhat difficult  Anhedonia: no Weight changes: no Insomnia: yes hard to stay asleep - wakes up two to three times due to stressors Hypersomnia: no Fatigue/loss of energy: yes Feelings of worthlessness: no Feelings of guilt: no Impaired concentration/indecisiveness: no Suicidal ideations: no  Crying spells: yes Recent Stressors/Life Changes: yes   Relationship problems: yes   Family stress: yes     Financial stress: no    Job stress: no    Recent death/loss: no     Jun 29, 2023    1:17 PM 03/02/2023    1:23 PM 08/30/2022    3:00 PM 06/09/2022    1:40 PM  GAD 7 : Generalized Anxiety Score  Nervous, Anxious, on Edge 3 3 3 1   Control/stop worrying 3 2 3 2   Worry too much - different things 3 2 3 2   Trouble relaxing 1 2 3 1   Restless 2 2 3  0  Easily annoyed or irritable 2 3 3 2   Afraid - awful might happen 2 3 3 1   Total GAD 7 Score 16 17 21 9   Anxiety Difficulty  Very difficult Very difficult Very difficult Somewhat difficult   Relevant past medical, surgical, family and social history reviewed and updated as indicated. Interim medical history since our last visit reviewed. Allergies and medications reviewed and updated.  Review of Systems  Constitutional:  Negative for activity change, appetite change, diaphoresis, fatigue and fever.  Respiratory:  Negative for cough, chest tightness, shortness of breath and wheezing.   Cardiovascular:  Negative for chest pain, palpitations and leg swelling.  Gastrointestinal:  Positive for abdominal pain. Negative for abdominal distention, constipation, diarrhea, nausea and vomiting.  Endocrine: Negative for cold intolerance and heat intolerance.  Neurological: Negative.   Psychiatric/Behavioral:  Negative for decreased concentration, self-injury, sleep disturbance and suicidal ideas. The patient is nervous/anxious.     Per HPI unless specifically indicated above      Objective:    BP 116/80 (BP Location: Left Arm, Patient Position: Sitting, Cuff Size: Normal)   Pulse 62   Temp 97.9 F (36.6 C) (Oral)   Wt 148 lb 6.4 oz (67.3 kg)   SpO2 96%   BMI 28.05 kg/m   Wt Readings from Last 3 Encounters:  05/30/23 148 lb 6.4 oz (67.3 kg)  03/02/23 149 lb 6.4 oz (67.8 kg)  03/01/23 148 lb 2 oz (67.2 kg)    Physical Exam Vitals and nursing note reviewed.  Constitutional:      General: She is awake. She is not in acute distress.    Appearance: She is well-developed, well-groomed and overweight. She is not ill-appearing or toxic-appearing.  HENT:     Head: Normocephalic.     Right Ear: Hearing and external ear normal.     Left Ear: Hearing and external ear normal.  Eyes:     General: Lids are normal.        Right eye: No discharge.        Left eye: No discharge.     Extraocular Movements: Extraocular movements intact.     Conjunctiva/sclera: Conjunctivae normal.     Pupils: Pupils are equal, round, and reactive to light.     Visual Fields: Right eye visual fields normal and left eye visual fields normal.  Neck:     Thyroid: No thyromegaly.     Vascular: No carotid bruit.  Cardiovascular:     Rate and Rhythm: Normal rate and regular rhythm.     Heart sounds: Normal heart sounds. No murmur heard.    No gallop.  Pulmonary:     Effort: Pulmonary effort is normal. No accessory muscle usage or respiratory distress.     Breath sounds: Normal breath sounds. No decreased breath sounds or wheezing.  Chest:  Breasts:    Right: Normal.     Left: Normal.  Abdominal:     General: Bowel sounds are normal.     Palpations: Abdomen is soft.     Tenderness: There is no right CVA tenderness, left CVA tenderness, guarding or rebound.     Hernia: No hernia is present.  Musculoskeletal:     Cervical back: Normal range of motion and neck supple.     Right lower leg: No edema.     Left lower leg: No edema.  Lymphadenopathy:     Cervical: No cervical adenopathy.      Upper Body:     Right upper body: No supraclavicular, axillary or pectoral adenopathy.     Left upper body: No supraclavicular, axillary or pectoral adenopathy.  Skin:    General: Skin is warm and dry.  Neurological:  Mental Status: She is alert and oriented to person, place, and time.     Cranial Nerves: Cranial nerves 2-12 are intact.     Sensory: Sensation is intact.     Motor: Motor function is intact.     Coordination: Coordination is intact.     Gait: Gait is intact.     Deep Tendon Reflexes: Reflexes are normal and symmetric.     Reflex Scores:      Brachioradialis reflexes are 2+ on the right side and 2+ on the left side.      Patellar reflexes are 2+ on the right side and 2+ on the left side. Psychiatric:        Attention and Perception: Attention normal.        Mood and Affect: Mood normal.        Speech: Speech normal.        Behavior: Behavior normal. Behavior is cooperative.        Thought Content: Thought content normal.    Results for orders placed or performed in visit on 03/02/23  CBC with Differential/Platelet  Result Value Ref Range   WBC 5.2 3.4 - 10.8 x10E3/uL   RBC 4.43 3.77 - 5.28 x10E6/uL   Hemoglobin 13.3 11.1 - 15.9 g/dL   Hematocrit 16.1 09.6 - 46.6 %   MCV 89 79 - 97 fL   MCH 30.0 26.6 - 33.0 pg   MCHC 33.7 31.5 - 35.7 g/dL   RDW 04.5 40.9 - 81.1 %   Platelets 233 150 - 450 x10E3/uL   Neutrophils 50 Not Estab. %   Lymphs 38 Not Estab. %   Monocytes 10 Not Estab. %   Eos 1 Not Estab. %   Basos 1 Not Estab. %   Neutrophils Absolute 2.6 1.4 - 7.0 x10E3/uL   Lymphocytes Absolute 2.0 0.7 - 3.1 x10E3/uL   Monocytes Absolute 0.5 0.1 - 0.9 x10E3/uL   EOS (ABSOLUTE) 0.1 0.0 - 0.4 x10E3/uL   Basophils Absolute 0.1 0.0 - 0.2 x10E3/uL   Immature Granulocytes 0 Not Estab. %   Immature Grans (Abs) 0.0 0.0 - 0.1 x10E3/uL  Comprehensive metabolic panel  Result Value Ref Range   Glucose 82 70 - 99 mg/dL   BUN 10 8 - 27 mg/dL   Creatinine, Ser  9.14 0.57 - 1.00 mg/dL   eGFR 78 >78 GN/FAO/1.30   BUN/Creatinine Ratio 12 12 - 28   Sodium 142 134 - 144 mmol/L   Potassium 4.5 3.5 - 5.2 mmol/L   Chloride 103 96 - 106 mmol/L   CO2 26 20 - 29 mmol/L   Calcium 9.2 8.7 - 10.3 mg/dL   Total Protein 6.6 6.0 - 8.5 g/dL   Albumin 4.2 3.9 - 4.9 g/dL   Globulin, Total 2.4 1.5 - 4.5 g/dL   Bilirubin Total 0.5 0.0 - 1.2 mg/dL   Alkaline Phosphatase 94 44 - 121 IU/L   AST 30 0 - 40 IU/L   ALT 21 0 - 32 IU/L  Lipid Panel w/o Chol/HDL Ratio  Result Value Ref Range   Cholesterol, Total 129 100 - 199 mg/dL   Triglycerides 68 0 - 149 mg/dL   HDL 49 >86 mg/dL   VLDL Cholesterol Cal 14 5 - 40 mg/dL   LDL Chol Calc (NIH) 66 0 - 99 mg/dL      Assessment & Plan:   Problem List Items Addressed This Visit       Other   Anxiety    Chronic, ongoing.  Continue Klonopin at this time, patient educated on risks of medication and is not interested in reducing -- refills sent in.  Discussed we would not increase this, unless she saw psychiatry.  Continue Prozac 40 MG which benefits anxiety and fibromyalgia, discussed with her changing to Zoloft or Duloxetine (which would benefit chronic pain issues) if increase anxiety/depression noted -- refuses.  At this time she denies SI/HI and wishes to maintain current regimen.  UDS yearly (next 08/31/23) and contract up to date.  Return in 3 months.       Relevant Medications   clonazePAM (KLONOPIN) 1 MG tablet (Start on 06/08/2023)   Benzodiazepine dependence, continuous (HCC)    Refer to anxiety plan for updates and discussion. UDS due 08/31/23.  Contract next visit.      Conversion disorder with mixed symptoms    Refer to anxiety plan of care.      Depression, recurrent (HCC) - Primary    Chronic, ongoing.  Continue current medication regimen and adjust as needed.  Denies SI/HI.  Consider change from Prozac to Cymbalta in future, which would offer benefit with anxiety and depression + fibromyalgia/alpha gal  -- she refuses this change today.  Very anxious about changes.  Referral to therapy placed in past, she never attended.  Continue to be concerned about cancer and health.        Follow up plan: Return in about 3 months (around 08/30/2023) for HTN/HLD, ANXIETY, COPD, Fibromyalgia.

## 2023-05-30 NOTE — Assessment & Plan Note (Addendum)
Chronic, ongoing.  Continue current medication regimen and adjust as needed.  Denies SI/HI.  Consider change from Prozac to Cymbalta in future, which would offer benefit with anxiety and depression + fibromyalgia/alpha gal -- she refuses this change today.  Very anxious about changes.  Referral to therapy placed in past, she never attended.  Continue to be concerned about cancer and health.

## 2023-05-30 NOTE — Assessment & Plan Note (Signed)
Refer to anxiety plan for updates and discussion. UDS due 08/31/23.  Contract next visit.

## 2023-05-30 NOTE — Assessment & Plan Note (Signed)
Chronic, ongoing.  Continue Klonopin at this time, patient educated on risks of medication and is not interested in reducing -- refills sent in.  Discussed we would not increase this, unless she saw psychiatry.  Continue Prozac 40 MG which benefits anxiety and fibromyalgia, discussed with her changing to Zoloft or Duloxetine (which would benefit chronic pain issues) if increase anxiety/depression noted -- refuses.  At this time she denies SI/HI and wishes to maintain current regimen.  UDS yearly (next 08/31/23) and contract up to date.  Return in 3 months.

## 2023-05-30 NOTE — Assessment & Plan Note (Signed)
Refer to anxiety plan of care. 

## 2023-06-25 DIAGNOSIS — R0789 Other chest pain: Secondary | ICD-10-CM | POA: Diagnosis not present

## 2023-06-25 DIAGNOSIS — K219 Gastro-esophageal reflux disease without esophagitis: Secondary | ICD-10-CM | POA: Diagnosis not present

## 2023-06-25 DIAGNOSIS — R110 Nausea: Secondary | ICD-10-CM | POA: Diagnosis not present

## 2023-06-25 DIAGNOSIS — Z8719 Personal history of other diseases of the digestive system: Secondary | ICD-10-CM | POA: Diagnosis not present

## 2023-06-25 DIAGNOSIS — R1319 Other dysphagia: Secondary | ICD-10-CM | POA: Diagnosis not present

## 2023-06-25 DIAGNOSIS — K5909 Other constipation: Secondary | ICD-10-CM | POA: Diagnosis not present

## 2023-06-25 DIAGNOSIS — K30 Functional dyspepsia: Secondary | ICD-10-CM | POA: Diagnosis not present

## 2023-07-07 ENCOUNTER — Other Ambulatory Visit: Payer: Self-pay

## 2023-07-07 ENCOUNTER — Emergency Department
Admission: EM | Admit: 2023-07-07 | Discharge: 2023-07-08 | Disposition: A | Discharge status: Home / Self Care | Payer: Medicare Other | Attending: Emergency Medicine | Admitting: Emergency Medicine

## 2023-07-07 ENCOUNTER — Emergency Department: Payer: Medicare Other

## 2023-07-07 DIAGNOSIS — Z8673 Personal history of transient ischemic attack (TIA), and cerebral infarction without residual deficits: Secondary | ICD-10-CM | POA: Insufficient documentation

## 2023-07-07 DIAGNOSIS — I6782 Cerebral ischemia: Secondary | ICD-10-CM | POA: Diagnosis not present

## 2023-07-07 DIAGNOSIS — R519 Headache, unspecified: Secondary | ICD-10-CM | POA: Diagnosis not present

## 2023-07-07 DIAGNOSIS — R42 Dizziness and giddiness: Secondary | ICD-10-CM | POA: Insufficient documentation

## 2023-07-07 DIAGNOSIS — J449 Chronic obstructive pulmonary disease, unspecified: Secondary | ICD-10-CM | POA: Diagnosis not present

## 2023-07-07 DIAGNOSIS — R531 Weakness: Secondary | ICD-10-CM | POA: Diagnosis not present

## 2023-07-07 DIAGNOSIS — G4489 Other headache syndrome: Secondary | ICD-10-CM | POA: Diagnosis not present

## 2023-07-07 DIAGNOSIS — I10 Essential (primary) hypertension: Secondary | ICD-10-CM | POA: Diagnosis not present

## 2023-07-07 LAB — CBC
HCT: 43.2 % (ref 36.0–46.0)
Hemoglobin: 14.3 g/dL (ref 12.0–15.0)
MCH: 29.8 pg (ref 26.0–34.0)
MCHC: 33.1 g/dL (ref 30.0–36.0)
MCV: 90 fL (ref 80.0–100.0)
Platelets: 241 10*3/uL (ref 150–400)
RBC: 4.8 MIL/uL (ref 3.87–5.11)
RDW: 12.7 % (ref 11.5–15.5)
WBC: 5.5 10*3/uL (ref 4.0–10.5)
nRBC: 0 % (ref 0.0–0.2)

## 2023-07-07 LAB — BASIC METABOLIC PANEL
Anion gap: 6 (ref 5–15)
BUN: 11 mg/dL (ref 8–23)
CO2: 27 mmol/L (ref 22–32)
Calcium: 8.9 mg/dL (ref 8.9–10.3)
Chloride: 104 mmol/L (ref 98–111)
Creatinine, Ser: 0.79 mg/dL (ref 0.44–1.00)
GFR, Estimated: >60 mL/min (ref >60)
Glucose, Bld: 85 mg/dL (ref 70–99)
Potassium: 4.1 mmol/L (ref 3.5–5.1)
Sodium: 137 mmol/L (ref 135–145)

## 2023-07-07 LAB — TROPONIN I (HIGH SENSITIVITY)
Troponin I (High Sensitivity): 4 ng/L (ref <18)
Troponin I (High Sensitivity): 3 ng/L (ref <18)

## 2023-07-07 MED ORDER — LORAZEPAM 2 MG/ML IJ SOLN
0.5000 mg | Freq: Once | INTRAMUSCULAR | Status: AC
  Administered 2023-07-07: 0.5 mg via INTRAVENOUS
  Filled 2023-07-07: qty 1

## 2023-07-07 MED ORDER — MECLIZINE HCL 25 MG PO TABS
25.0000 mg | ORAL_TABLET | Freq: Once | ORAL | Status: AC
  Administered 2023-07-07: 25 mg via ORAL
  Filled 2023-07-07: qty 1

## 2023-07-07 MED ORDER — METOCLOPRAMIDE HCL 5 MG/ML IJ SOLN
10.0000 mg | INTRAMUSCULAR | Status: AC
  Administered 2023-07-07: 10 mg via INTRAVENOUS
  Filled 2023-07-07: qty 2

## 2023-07-07 MED ORDER — SODIUM CHLORIDE 0.9 % IV BOLUS
1000.0000 mL | Freq: Once | INTRAVENOUS | Status: AC
  Administered 2023-07-07: 1000 mL via INTRAVENOUS

## 2023-07-07 NOTE — ED Provider Notes (Signed)
[facility] Provider Note    Event Date/Time   First MD Initiated Contact with Patient 07/07/23 2129     (approximate)   History   Chief Complaint: Dizziness   HPI  Lindsey Bautista is a 62 y.o. female with a history of COPD, prior strokes who comes ED complaining of bilateral frontal headache and dizziness for the last few days.  No falls trauma neck pain or fever.  No vision change.  Just feels off balance like the room is spinning.  Has had prior similar episodes.          Physical Exam   Triage Vital Signs: ED Triage Vitals  Encounter Vitals Group     BP 07/07/23 1659 (!) 153/71     Systolic BP Percentile --      Diastolic BP Percentile --      Pulse Rate 07/07/23 1654 66     Resp 07/07/23 1654 18     Temp 07/07/23 1654 99.5 F (37.5 C)     Temp Source 07/07/23 1654 Oral     SpO2 07/07/23 1654 100 %     Weight --      Height --      Head Circumference --      Peak Flow --      Pain Score 07/07/23 1656 0     Pain Loc --      Pain Education --      Exclude from Growth Chart --     Most recent vital signs: Vitals:   07/07/23 2127 07/07/23 2230  BP:  (!) 143/67  Pulse:  69  Resp:  16  Temp: 98.3 F (36.8 C)   SpO2:  100%    General: Awake, no distress.  CV:  Good peripheral perfusion.  Regular rate and rhythm Resp:  Normal effort.  Clear to auscultation bilaterally Abd:  No distention.  Soft nontender Other:  Cranial nerves III through XII intact.  No drift, normal finger-nose, normal speech and language.  Intact sensation, no neglect.  ED Results / Procedures / Treatments   Labs (all labs ordered are listed, but only abnormal results are displayed) Labs Reviewed  BASIC METABOLIC PANEL  CBC  URINALYSIS, ROUTINE W REFLEX MICROSCOPIC  TROPONIN I (HIGH SENSITIVITY)  TROPONIN I (HIGH SENSITIVITY)     EKG Interpreted by me Normal sinus rhythm rate of 78.  Normal axis intervals QRS ST segments and T  waves   RADIOLOGY MRI brain pending   PROCEDURES:  Procedures   MEDICATIONS ORDERED IN ED: Medications  sodium chloride 0.9 % bolus 1,000 mL (1,000 mLs Intravenous New Bag/Given 07/07/23 2256)  meclizine (ANTIVERT) tablet 25 mg (25 mg Oral Given 07/07/23 2259)  metoCLOPramide (REGLAN) injection 10 mg (10 mg Intravenous Given 07/07/23 2259)  LORazepam (ATIVAN) injection 0.5 mg (0.5 mg Intravenous Given 07/07/23 2259)     IMPRESSION / MDM / ASSESSMENT AND PLAN / ED COURSE  I reviewed the triage vital signs and the nursing notes.  DDx: Electrolyte abnormality, anemia, dehydration, vertigo, CVA  Patient's presentation is most consistent with acute presentation with potential threat to life or bodily function.  Patient presents with dizziness, no focal weakness or other lateralizing neurodeficits.  She does have history of prior strokes.  Symptoms have been persistent for a few days, not positional.  Will give IV fluids, meclizine Reglan Ativan and obtain MRI.  Patient reported that she did not think she could have an MRI with her ocular lens  implants from cataract surgery in September 2023.  I discussed with ophthalmology Dr. Inez Pilgrim  who confirms that MRI is totally fine.       FINAL CLINICAL IMPRESSION(S) / ED DIAGNOSES   Final diagnoses:  Dizziness     Rx / DC Orders   ED Discharge Orders     None        Note:  This document was prepared using Dragon voice recognition software and may include unintentional dictation errors.   Sharman Cheek, MD 07/07/23 2303

## 2023-07-07 NOTE — ED Triage Notes (Signed)
Pt from home via EMS for headache and dizziness, history of strokes. Since last strokes pt has had undiagnosed TIA's. Pt was sick two weeks ago, c/o generalized weakness and difficulty w/ ADL's since. Pt AOX4, NAD noted. Right sided weakness baseline from previous strokes.  BGL-89 99%RA 69 162/67

## 2023-07-08 DIAGNOSIS — R42 Dizziness and giddiness: Secondary | ICD-10-CM | POA: Diagnosis not present

## 2023-07-08 MED ORDER — MECLIZINE HCL 25 MG PO TABS: 25.0000 mg | ORAL_TABLET | Freq: Three times a day (TID) | ORAL | 0 refills | Status: AC | PRN

## 2023-07-08 NOTE — ED Provider Notes (Signed)
-----------------------------------------   1:53 AM on 07/08/2023 -----------------------------------------  I took over care of this patient from Dr. Scotty Court.  On reassessment, the patient states she is feeling better.  Repeat troponin is negative.  MRI/MRI are negative for acute findings.  IMPRESSION:  MRI HEAD:    1. No acute intracranial abnormality.  2. Mild chronic microvascular ischemic disease for age.    MRA HEAD:    Normal intracranial MRA.    At this time, the patient is stable for discharge home.  I counseled her on the results of the workup.  I gave strict return precautions and she expresses understanding.   Dionne Bucy, MD 07/08/23 [phone]

## 2023-07-30 ENCOUNTER — Telehealth: Payer: Self-pay | Admitting: *Deleted

## 2023-07-30 NOTE — Telephone Encounter (Signed)
Transition Care Management Unsuccessful Follow-up Telephone Call  Date of discharge and from where:  [facility]  Attempts:  1st Attempt  Reason for unsuccessful TCM follow-up call:  No answer/busy

## 2023-08-01 ENCOUNTER — Telehealth: Payer: Self-pay | Admitting: *Deleted

## 2023-08-01 NOTE — Telephone Encounter (Signed)
Transition Care Management Follow-up Telephone Call Date of discharge and from where: [facility]  07/08/2023 How have you been since you were released from the hospital? Feeling good no real bouts of serious dizziness  Any questions or concerns? No  Items Reviewed: Did the pt receive and understand the discharge instructions provided? Yes  Medications obtained and verified? Yes  Other? No  Any new allergies since your discharge? No  Dietary orders reviewed? No Do you have support at home?  Yes    Follow up appointments reviewed:  PCP Hospital f/u appt confirmed? Yes  Will see pcp in January Are transportation arrangements needed? No  If their condition worsens, is the pt aware to call PCP or go to the Emergency Dept.? Yes Was the patient provided with contact information for the PCP's office or ED? Yes Was to pt encouraged to call back with questions or concerns? Yes

## 2023-08-06 ENCOUNTER — Ambulatory Visit: Payer: Medicare Other

## 2023-08-06 DIAGNOSIS — R1319 Other dysphagia: Secondary | ICD-10-CM

## 2023-08-06 DIAGNOSIS — K449 Diaphragmatic hernia without obstruction or gangrene: Secondary | ICD-10-CM

## 2023-08-06 DIAGNOSIS — Z8719 Personal history of other diseases of the digestive system: Secondary | ICD-10-CM

## 2023-08-16 ENCOUNTER — Other Ambulatory Visit: Payer: Self-pay | Admitting: Acute Care

## 2023-08-16 DIAGNOSIS — Z87891 Personal history of nicotine dependence: Secondary | ICD-10-CM

## 2023-08-16 DIAGNOSIS — F17210 Nicotine dependence, cigarettes, uncomplicated: Secondary | ICD-10-CM

## 2023-08-16 DIAGNOSIS — Z122 Encounter for screening for malignant neoplasm of respiratory organs: Secondary | ICD-10-CM

## 2023-08-25 NOTE — Patient Instructions (Signed)
 Managing Anxiety, Adult  After being diagnosed with anxiety, you may be relieved to know why you have felt or behaved a certain way. You may also feel overwhelmed about the treatment ahead and what it will mean for your life. With care and support, you can manage your anxiety.  How to manage lifestyle changes  Understanding the difference between stress and anxiety  Although stress can play a role in anxiety, it is not the same as anxiety. Stress is your body's reaction to life changes and events, both good and bad. Stress is often caused by something external, such as a deadline, test, or competition. It normally goes away after the event has ended and will last just a few hours. But, stress can be ongoing and can lead to more than just stress.  Anxiety is caused by something internal, such as imagining a terrible outcome or worrying that something will go wrong that will greatly upset you. Anxiety often does not go away even after the event is over, and it can become a long-term (chronic) worry.  Lowering stress and anxiety    Talk with your health care provider or a counselor to learn more about lowering anxiety and stress. They may suggest tension-reduction techniques, such as:  Music. Spend time creating or listening to music that you enjoy and that inspires you.  Mindfulness-based meditation. Practice being aware of your normal breaths while not trying to control your breathing. It can be done while sitting or walking.  Centering prayer. Focus on a word, phrase, or sacred image that means something to you and brings you peace.  Deep breathing. Expand your stomach and inhale slowly through your nose. Hold your breath for 3-5 seconds. Then breathe out slowly, letting your stomach muscles relax.  Self-talk. Learn to notice and spot thought patterns that lead to anxiety reactions. Change those patterns to thoughts that feel peaceful.  Muscle relaxation. Take time to tense muscles and then relax them.  Choose a  tension-reduction technique that fits your lifestyle and personality. These techniques take time and practice. Set aside 5-15 minutes a day to do them. Specialized therapists can offer counseling and training in these techniques. The training to help with anxiety may be covered by some insurance plans.  Other things you can do to manage stress and anxiety include:  Keeping a stress diary. This can help you learn what triggers your reaction and then learn ways to manage your response.  Thinking about how you react to certain situations. You may not be able to control everything, but you can control your response.  Making time for activities that help you relax and not feeling guilty about spending your time in this way.  Doing visual imagery. This involves imagining or creating mental pictures to help you relax.  Practicing yoga. Through yoga poses, you can lower tension and relax.     Medicines  Medicines for anxiety include:  Antidepressant medicines. These are usually prescribed for long-term daily control.  Anti-anxiety medicines. These may be added in severe cases, especially when panic attacks occur.  When used together, medicines, psychotherapy, and tension-reduction techniques may be the most effective treatment.  Relationships  Relationships can play a big part in helping you recover. Spend more time connecting with trusted friends and family members. Think about going to couples counseling if you have a partner, taking family education classes, or going to family therapy. Therapy can help you and others better understand your anxiety.  How to recognize changes in  your anxiety  Everyone responds differently to treatment for anxiety. Recovery from anxiety happens when symptoms lessen and stop interfering with your daily life at home or work. This may mean that you will start to:  Have better concentration and focus. Worry will interfere less in your daily thinking.  Sleep better.  Be less irritable.  Have  more energy.  Have improved memory.  Try to recognize when your condition is getting worse. Contact your provider if your symptoms interfere with home or work and you feel like your condition is not improving.  Follow these instructions at home:  Activity  Exercise. Adults should:  Exercise for at least 150 minutes each week. The exercise should increase your heart rate and make you sweat (moderate-intensity exercise).  Do strengthening exercises at least twice a week.  Get the right amount and quality of sleep. Most adults need 7-9 hours of sleep each night.  Lifestyle    Eat a healthy diet that includes plenty of vegetables, fruits, whole grains, low-fat dairy products, and lean protein.  Do not eat a lot of foods that are high in fats, added sugars, or salt (sodium).  Make choices that simplify your life.  Do not use any products that contain nicotine or tobacco. These products include cigarettes, chewing tobacco, and vaping devices, such as e-cigarettes. If you need help quitting, ask your provider.  Avoid caffeine, alcohol, and certain over-the-counter cold medicines. These may make you feel worse. Ask your pharmacist which medicines to avoid.  General instructions  Take over-the-counter and prescription medicines only as told by your provider.  Keep all follow-up visits. This is to make sure you are managing your anxiety well or if you need more support.  Where to find support  You can get help and support from:  Self-help groups.  Online and [employer].  A trusted spiritual leader.  Couples counseling.  Family education classes.  Family therapy.  Where to find more information  You may find that joining a support group helps you deal with your anxiety. The following sources can help you find counselors or support groups near you:  Mental Health [place]: mentalhealthamerica.net  Anxiety and Depression Association of [place] (ADAA): adaa.org  [employer] Mental Illness (NAMI):  nami.org  Contact a health care provider if:  You have a hard time staying focused or finishing tasks.  You spend many hours a day feeling worried about everyday life.  You are very tired because you cannot stop worrying.  You start to have headaches or often feel tense.  You have chronic nausea or diarrhea.  Get help right away if:  Your heart feels like it is racing.  You have shortness of breath.  You have thoughts of hurting yourself or others.  Get help right away if you feel like you may hurt yourself or others, or have thoughts about taking your own life. Go to your nearest emergency room or:  Call 911.  Call the National Suicide Prevention Lifeline at (562)309-0957 or 988. This is open 24 hours a day.  Text the Crisis Text Line at 786-142-3811.  This information is not intended to replace advice given to you by your health care provider. Make sure you discuss any questions you have with your health care provider.  Document Revised: 05/16/2022 Document Reviewed: 11/28/2020  Elsevier Patient Education  2024 [employer].

## 2023-08-27 NOTE — Progress Notes (Signed)
 Cardiology Office Note  Date:  08/28/2023   ID:  Lindsey Bautista, DOB 1961/06/30, MRN [id number]  PCP:  Lindsey Melanie DASEN, NP   Chief Complaint  Patient presents with   [facility] ER follow up; dizziness     Patient c/o dizziness most days; has fallen 3 times due to leg weakness & heaviness. The patient also c/o weight gain, bilateral LE edema,  chest tightness/discomfort, right hand swelling every morning; takes about 3 hours to go down.      HPI:  Lindsey Bautista is a 63 year old woman with history of  Medication noncompliance anxiety/depression,  Mild carotid disease long smoking history for close to 40 years, who continues to smoke 1/2 ppd stroke type symptoms in April 2015 where she could not talk for 4 days, again in April 2017 diagnosed with conversion disorder again in March 2018 CAD Hyperlipidemia Chronic chest pain, dyspnea on exertion 2019 diagnostic cath which showed 50% mid LAD stenosis with FFR being normal.  moderate diffuse PAD extending into the common iliac arteries  who presents for prior episode of syncope, chest pain.  Last seen by myself in clinic February 2023  Vertigo type symptoms started in November 2024  Seen in the emergency room July 07, 2019 for for headache, dizziness Hospital records reviewed Blood pressure was elevated 153/71 Workup negative, was given IV fluids, meclizine , Reglan , Ativan  Normal head MRI  Right hand swelling when waking, takes 3 hours to go down Weakness in right arm  Arms and legs hurt, worse with ambulation in the legs Leg tenderness Fallen 3x in past 2 weeks secondary to leg weakness  Chronic chest pain  Continues to smoke, 1/2 ppd smoking, smokes outside  Prior hx of esophagus stretching x2  EKG personally reviewed by myself on todays visit EKG Interpretation Date/Time:  Tuesday August 28 2023 14:36:34 EST Ventricular Rate:  61 PR Interval:  154 QRS Duration:  78 QT Interval:  426 QTC Calculation: 428 R  Axis:   59  Text Interpretation: Normal sinus rhythm Normal ECG When compared with ECG of 07-Jul-2023 16:46, No significant change was found Confirmed by Lindsey Bautista [phone]) on 08/28/2023 3:02:40 PM    Other past medical history reviewed LE arterial doppler: 10/2019 Normal ABIs  Stress test 06/2020: normal myoview   Stress test 2020 , 2022 with no ischemia, no ejection fraction Stress test 2013, 2015, 2016, 2018  Concerned about carotids, last evaluated 2 years ago,  mild disease  Other past medical history reviewed hospital in 05/2018 with chest pain and ruled out.  diagnostic cath which showed 50% mid LAD stenosis with FFR being normal.  Echo  normal LV systolic function.   Headaches on Imdur   Stroke type symptoms April 2017 diagnosed with conversion disorder Stress test January 2018 for chest pain with no ischemia  Had  stroke type symptomsMarch 2018 Right-sided weakness, migraine She had chest pain, multiple problems, right-sided weakness, headache Carotid u/s mild disease MRI was negative. Started on for verapamil  for migraine.  Previously having passed out spells sometimes when she is sitting, sometimes when she is standing.  Previous history of excess somnolence, random periods of weakness She is never injured herself when she passes out even despite having events when she is standing up.   CT scan of the abdomen from December 2016  showing moderate diffuse PAD extending into the common iliac arteries    PMH:   has a past medical history of Arthritis, Atypical chest pain, COPD (chronic obstructive pulmonary disease) (HCC), Coronary  artery disease, non-occlusive, Depression, Diastolic dysfunction, Fibromyalgia, GERD (gastroesophageal reflux disease), Hypotension, IBS (irritable bowel syndrome), Right arm weakness, Situational syncope, Stroke (HCC), TIA (transient ischemic attack), and Wears dentures.  PSH:    Past Surgical History:  Procedure Laterality Date    CATARACT EXTRACTION W/PHACO Right 05/04/2022   Procedure: CATARACT EXTRACTION PHACO AND INTRAOCULAR LENS PLACEMENT (IOC) RIGHT 3.89 00:47.4;  Surgeon: Lindsey Feliciano Hugger, Bautista;  Location: [facility];  Service: Ophthalmology;  Laterality: Right;   CATARACT EXTRACTION W/PHACO Left 05/18/2022   Procedure: CATARACT EXTRACTION PHACO AND INTRAOCULAR LENS PLACEMENT (IOC) LEFT 1.44 00:19.7;  Surgeon: Lindsey Feliciano Hugger, Bautista;  Location: [facility];  Service: Ophthalmology;  Laterality: Left;   COLONOSCOPY WITH PROPOFOL  N/A 11/22/2015   Procedure: COLONOSCOPY WITH PROPOFOL ;  Surgeon: Lindsey Lindsey Holmes, Bautista;  Location: [facility];  Service: Endoscopy;  Laterality: N/A;   CORONARY PRESSURE/FFR STUDY N/A 06/19/2018   Procedure: INTRAVASCULAR PRESSURE WIRE/FFR STUDY;  Surgeon: Lindsey Bruckner, Bautista;  Location: [facility] INVASIVE CV LAB;  Service: Cardiovascular;  Laterality: N/A;   ESOPHAGOGASTRODUODENOSCOPY (EGD) WITH PROPOFOL  N/A 11/22/2015   Procedure: ESOPHAGOGASTRODUODENOSCOPY (EGD) WITH PROPOFOL ;  Surgeon: Lindsey Lindsey Holmes, Bautista;  Location: [facility];  Service: Endoscopy;  Laterality: N/A;   LEFT HEART CATH AND CORONARY ANGIOGRAPHY N/A 06/19/2018   Procedure: LEFT HEART CATH AND CORONARY ANGIOGRAPHY;  Surgeon: Lindsey Bruckner, Bautista;  Location: [facility] INVASIVE CV LAB;  Service: Cardiovascular;  Laterality: N/A;   OOPHORECTOMY     TONSILLECTOMY     VAGINAL HYSTERECTOMY      Current Outpatient Medications  Medication Sig Dispense Refill   atorvastatin  (LIPITOR) 80 MG tablet Take 1 tablet (80 mg total) by mouth daily. 90 tablet 4   clonazePAM  (KLONOPIN ) 1 MG tablet Take 1 tablet (1 mg total) by mouth 2 (two) times daily. 60 tablet 2   dimenhyDRINATE (DRAMAMINE) 50 MG tablet Take 50 mg by mouth every 8 (eight) hours as needed.     docusate sodium  (COLACE) 100 MG capsule Take 100 mg by mouth daily.      ezetimibe  (ZETIA ) 10 MG tablet Take 1 tablet (10 mg total) by mouth daily. 90 tablet 4    FLUoxetine  (PROZAC ) 40 MG capsule Take 1 capsule (40 mg total) by mouth daily. 90 capsule 4   meclizine  (ANTIVERT ) 25 MG tablet Take 1 tablet (25 mg total) by mouth 3 (three) times daily as needed. 15 tablet 0   nitroGLYCERIN  (NITROSTAT ) 0.4 MG SL tablet Place 1 tablet (0.4 mg total) under the tongue every 5 (five) minutes as needed for chest pain. 50 tablet 3   tiZANidine  (ZANAFLEX ) 2 MG tablet Take 1 tablet (2 mg total) by mouth every 8 (eight) hours as needed for muscle spasms. 90 tablet 1   EPINEPHrine  0.3 mg/0.3 mL IJ SOAJ injection Inject 0.3 mg into the muscle as needed for anaphylaxis. (Patient not taking: Reported on 08/28/2023) 1 each 0   No current facility-administered medications for this visit.     Allergies:   Amoxicillin, Flagyl  [metronidazole ], Penicillin g, Iodinated contrast media, Alpha-gal, and Flomax  [tamsulosin  hcl]   Social History:  The patient  reports that she has been smoking cigarettes. She has a 33.8 pack-year smoking history. She has never used smokeless tobacco. She reports that she does not drink alcohol and does not use drugs.   Family History:   family history includes Bladder Cancer in her maternal aunt and maternal aunt; Bladder Cancer (age of onset: 37) in her mother; Breast cancer in her maternal  aunt; Breast cancer (age of onset: 26) in her paternal aunt; COPD in her father and another family member; Cancer in her paternal grandfather; Colon cancer in her maternal grandmother; Heart disease in an other family member; Heart failure in her mother; Prostate cancer in her cousin and cousin; Renal Disease in her mother; Stroke in an other family member.    Review of Systems: Review of Systems  Constitutional: Negative.   HENT: Negative.    Respiratory: Negative.    Cardiovascular:  Positive for chest pain.  Gastrointestinal: Negative.   Musculoskeletal:  Positive for myalgias and neck pain.  Neurological: Negative.   Psychiatric/Behavioral: Negative.    All  other systems reviewed and are negative.   PHYSICAL EXAM: VS:  BP (!) 110/58 (BP Location: Left Arm, Patient Position: Sitting, Cuff Size: Normal)   Pulse 61   Ht 5' 1 (1.549 m)   Wt 149 lb 2 oz (67.6 kg)   SpO2 98%   BMI 28.18 kg/m  , BMI Body mass index is 28.18 kg/m. Constitutional:  oriented to person, place, and time. No distress.  HENT:  Head: Grossly normal Eyes:  no discharge. No scleral icterus.  Neck: No JVD, no carotid bruits  Cardiovascular: Regular rate and rhythm, no murmurs appreciated Pulmonary/Chest: Clear to auscultation bilaterally, no wheezes or rails Abdominal: Soft.  no distension.  no tenderness.  Musculoskeletal: Normal range of motion Neurological:  normal muscle tone. Coordination normal. No atrophy Skin: Skin warm and dry Psychiatric: normal affect, pleasant   Recent Labs: 08/30/2022: Magnesium  1.9; TSH 1.490 11/04/2022: B Natriuretic Peptide 20.2 03/02/2023: ALT 21 07/07/2023: BUN 11; Creatinine, Ser 0.79; Hemoglobin 14.3; Platelets 241; Potassium 4.1; Sodium 137    Lipid Panel Lab Results  Component Value Date   CHOL 129 03/02/2023   HDL 49 03/02/2023   LDLCALC 66 03/02/2023   TRIG 68 03/02/2023    Wt Readings from Last 3 Encounters:  08/28/23 149 lb 2 oz (67.6 kg)  05/30/23 148 lb 6.4 oz (67.3 kg)  03/02/23 149 lb 6.4 oz (67.8 kg)      ASSESSMENT AND PLAN:  PAD (peripheral artery disease) (HCC) Moderate disease seen on CT scan  good ABIs 2021 Reports worsening leg pain, weakness worse on ambulation Chronic leg tenderness consistent with fibromyalgia Continues to smoke Repeat ABIs ordered  Situational syncope No recent episodes of syncope stable  Abdominal aortic atherosclerosis (HCC) Smoking cessation recommended, aggressive lipid management Given muscle pain arms legs recommend she hold Lipitor If symptoms improve, may need PCSK9 inhibitor  Mixed hyperlipidemia - Plan: EKG 12-Lead Recommend Lipitor hold for now,  continue zetia   Smoker - We have encouraged him to continue to work on weaning his cigarettes and smoking cessation. He will continue to work on this and does not want any assistance with chantix.    Centrilobular emphysema (HCC) Continues to smoke, 1/2 pack/day  chronic shortness of breath on exertion  Chest pressure  Atypical,  Numerous stress test dating back to 2013 one a year or every other year No further testing at this time,  Depression/anxiety Managed by primary care  GERD Consider PPI Burning vocal cords,  Followed by ENT  Vertigo Recommend meclizine  as needed   Orders Placed This Encounter  Procedures   EKG 12-Lead   VAS US  ABI WITH/WO TBI      Signed, Velinda Lunger, M.D., Ph.D. 08/28/2023  [facility] [place], [place] [phone]

## 2023-08-28 ENCOUNTER — Encounter: Payer: Self-pay | Admitting: Cardiovascular Disease

## 2023-08-28 ENCOUNTER — Ambulatory Visit: Payer: Medicare Other | Attending: Cardiovascular Disease | Admitting: Cardiovascular Disease

## 2023-08-28 VITALS — BP 110/58 | HR 61 | Ht 61.0 in | Wt 149.1 lb

## 2023-08-28 DIAGNOSIS — R079 Chest pain, unspecified: Secondary | ICD-10-CM | POA: Diagnosis not present

## 2023-08-28 DIAGNOSIS — G8929 Other chronic pain: Secondary | ICD-10-CM

## 2023-08-28 DIAGNOSIS — F17210 Nicotine dependence, cigarettes, uncomplicated: Secondary | ICD-10-CM

## 2023-08-28 DIAGNOSIS — I700 Atherosclerosis of aorta: Secondary | ICD-10-CM

## 2023-08-28 DIAGNOSIS — M797 Fibromyalgia: Secondary | ICD-10-CM

## 2023-08-28 DIAGNOSIS — I6521 Occlusion and stenosis of right carotid artery: Secondary | ICD-10-CM

## 2023-08-28 DIAGNOSIS — J432 Centrilobular emphysema: Secondary | ICD-10-CM | POA: Diagnosis not present

## 2023-08-28 DIAGNOSIS — Z720 Tobacco use: Secondary | ICD-10-CM

## 2023-08-28 DIAGNOSIS — I2510 Atherosclerotic heart disease of native coronary artery without angina pectoris: Secondary | ICD-10-CM

## 2023-08-28 DIAGNOSIS — E785 Hyperlipidemia, unspecified: Secondary | ICD-10-CM

## 2023-08-28 DIAGNOSIS — R0602 Shortness of breath: Secondary | ICD-10-CM

## 2023-08-28 DIAGNOSIS — I739 Peripheral vascular disease, unspecified: Secondary | ICD-10-CM

## 2023-08-28 NOTE — Patient Instructions (Addendum)
 Medication Instructions:  Hold the atorvastatin  for one month, see if muscle pain improves  If you need a refill on your cardiac medications before your next appointment, please call your pharmacy.   Lab work: No new labs needed  Testing/Procedures: Your physician has requested that you have an ankle brachial index (ABI). During this test an ultrasound and blood pressure cuff are used to evaluate the arteries that supply the arms and legs with blood.  Allow thirty minutes for this exam.  There are no restrictions or special instructions.  This will take place at [address] [facility], [place] [address]  Please note: We ask at that you not bring children with you during ultrasound (echo/ vascular) testing. Due to room size and safety concerns, children are not allowed in the ultrasound rooms during exams. Our front office staff cannot provide observation of children in our lobby area while testing is being conducted. An adult accompanying a patient to their appointment will only be allowed in the ultrasound room at the discretion of the ultrasound technician under special circumstances. We apologize for any inconvenience.   Follow-Up: At [facility], you and your health needs are our priority.  As part of our continuing mission to provide you with exceptional heart care, we have created designated Provider Care Teams.  These Care Teams include your primary Cardiologist (physician) and Advanced Practice Providers (APPs -  Physician Assistants and Nurse Practitioners) who all work together to provide you with the care you need, when you need it.  You will need a follow up appointment in 12 months  Providers on your designated Care Team:   Lonni Meager, NP Bernardino Bring, PA-C Cadence [place], [place]  COVID-19 Vaccine Information can be found at: [email or web address] For questions related to vaccine distribution  or appointments, please email [email or web address] or call [phone].

## 2023-08-31 ENCOUNTER — Ambulatory Visit (INDEPENDENT_AMBULATORY_CARE_PROVIDER_SITE_OTHER): Payer: Medicare Other | Admitting: Nurse Practitioner

## 2023-08-31 ENCOUNTER — Encounter: Payer: Self-pay | Admitting: Nurse Practitioner

## 2023-08-31 ENCOUNTER — Other Ambulatory Visit: Payer: Self-pay | Admitting: Nurse Practitioner

## 2023-08-31 VITALS — BP 132/73 | HR 74 | Temp 98.4°F | Ht 61.0 in | Wt 150.4 lb

## 2023-08-31 DIAGNOSIS — I2510 Atherosclerotic heart disease of native coronary artery without angina pectoris: Secondary | ICD-10-CM

## 2023-08-31 DIAGNOSIS — D518 Other vitamin B12 deficiency anemias: Secondary | ICD-10-CM | POA: Diagnosis not present

## 2023-08-31 DIAGNOSIS — Z79899 Other long term (current) drug therapy: Secondary | ICD-10-CM

## 2023-08-31 DIAGNOSIS — R350 Frequency of micturition: Secondary | ICD-10-CM | POA: Diagnosis not present

## 2023-08-31 DIAGNOSIS — M797 Fibromyalgia: Secondary | ICD-10-CM

## 2023-08-31 DIAGNOSIS — K2270 Barrett's esophagus without dysplasia: Secondary | ICD-10-CM

## 2023-08-31 DIAGNOSIS — E782 Mixed hyperlipidemia: Secondary | ICD-10-CM

## 2023-08-31 DIAGNOSIS — J432 Centrilobular emphysema: Secondary | ICD-10-CM

## 2023-08-31 DIAGNOSIS — I700 Atherosclerosis of aorta: Secondary | ICD-10-CM | POA: Diagnosis not present

## 2023-08-31 DIAGNOSIS — F1320 Sedative, hypnotic or anxiolytic dependence, uncomplicated: Secondary | ICD-10-CM | POA: Diagnosis not present

## 2023-08-31 DIAGNOSIS — E559 Vitamin D deficiency, unspecified: Secondary | ICD-10-CM | POA: Diagnosis not present

## 2023-08-31 DIAGNOSIS — F339 Major depressive disorder, recurrent, unspecified: Secondary | ICD-10-CM | POA: Diagnosis not present

## 2023-08-31 DIAGNOSIS — F419 Anxiety disorder, unspecified: Secondary | ICD-10-CM

## 2023-08-31 DIAGNOSIS — F17210 Nicotine dependence, cigarettes, uncomplicated: Secondary | ICD-10-CM

## 2023-08-31 LAB — URINALYSIS, ROUTINE W REFLEX MICROSCOPIC
Bilirubin, UA: NEGATIVE
Glucose, UA: NEGATIVE
Ketones, UA: NEGATIVE
Leukocytes,UA: NEGATIVE
Nitrite, UA: NEGATIVE
Protein,UA: NEGATIVE
RBC, UA: NEGATIVE
Specific Gravity, UA: 1.025 (ref 1.005–1.030)
Urobilinogen, Ur: 0.2 mg/dL (ref 0.2–1.0)
pH, UA: 6.5 (ref 5.0–7.5)

## 2023-08-31 MED ORDER — PREDNISONE 20 MG PO TABS: 40.0000 mg | ORAL_TABLET | Freq: Every day | ORAL | 0 refills | Status: AC

## 2023-08-31 MED ORDER — CLONAZEPAM 1 MG PO TABS: 1.0000 mg | ORAL_TABLET | Freq: Two times a day (BID) | ORAL | 2 refills | Status: DC

## 2023-08-31 MED ORDER — AZITHROMYCIN 250 MG PO TABS: ORAL_TABLET | ORAL | 1 refills | Status: AC

## 2023-08-31 NOTE — Assessment & Plan Note (Signed)
 Chronic, ongoing.  Recommended she try stopping Prozac and change to Duloxetine which would benefit mood and pain, she refuses this - fearful of change.  Continue Trazodone as needed.  Tried Gabapentin in past and did not like this.

## 2023-08-31 NOTE — Assessment & Plan Note (Signed)
 Chronic, ongoing.  Continue current medication regimen and adjust as needed.  Denies SI/HI.  Consider change from Prozac  to Cymbalta in future, which would offer benefit with anxiety and depression + fibromyalgia/alpha gal -- she refuses this change today.  Very anxious about changes.  Referral to therapy placed in past, she never attended.  Continues to be concerned about cancer and health.

## 2023-08-31 NOTE — Assessment & Plan Note (Signed)
 Chronic, ongoing.  No current inhaler regimen.  Denies any symptoms.  Continue yearly lung screening and plan on spirometry next visit to assess function.  Recommend complete cessation of smoking.  Will send in Azithromycin  and Prednisone  for current sinus symptoms since going into weekend and bad weather + symptoms over 7 days.

## 2023-08-31 NOTE — Assessment & Plan Note (Addendum)
 I have recommended complete cessation of tobacco use. I have discussed various options available for assistance with tobacco cessation including over the counter methods (Nicotine gum, patch and lozenges). We also discussed prescription options (Chantix, Nicotine Inhaler / Nasal Spray). The patient is not interested in pursuing any prescription tobacco cessation options at this time.  Discussed with her she may notice symptoms as she reduces smoking, including occasional sweats or weight gain.

## 2023-08-31 NOTE — Assessment & Plan Note (Signed)
Chronic, ongoing, continue daily supplement and adjust as needed.  Check level today.

## 2023-08-31 NOTE — Assessment & Plan Note (Signed)
Chronic, ongoing, noted on October 2019 lung screening CT.  Continue statin and ASA daily + recommend complete cessation of smoking. 

## 2023-08-31 NOTE — Assessment & Plan Note (Signed)
Chronic, ongoing.  Continue daily supplement and adjust as needed.  Check level today.

## 2023-08-31 NOTE — Assessment & Plan Note (Signed)
 Chronic, ongoing.  Continue Klonopin at this time, patient educated on risks of medication and is not interested in reducing -- refills sent in.  Discussed we would not increase this, unless she saw psychiatry.  Continue Prozac 40 MG which benefits anxiety and fibromyalgia, discussed with her changing to Zoloft or Duloxetine (which would benefit chronic pain issues) if increase anxiety/depression noted -- refuses.  At this time she denies SI/HI and wishes to maintain current regimen.  UDS yearly (next 08/30/24) and contract next visit.  Return in 3 months.

## 2023-08-31 NOTE — Assessment & Plan Note (Signed)
Chronic, ongoing.  Continue current medication regimen and collaboration with cardiology.  Lipid panel today. 

## 2023-08-31 NOTE — Assessment & Plan Note (Signed)
Chronic, stable.  Continue current medication regimen and collaboration with cardiology. 

## 2023-08-31 NOTE — Assessment & Plan Note (Signed)
 Long term benzo use.  Obtain UDS today (next 08/30/2024) and obtain contract next visit.

## 2023-08-31 NOTE — Assessment & Plan Note (Signed)
 Refer to anxiety plan for updates and discussion. UDS obtained today, due next 08/30/24.  Contract next visit.

## 2023-08-31 NOTE — Progress Notes (Signed)
 BP 132/73   Pulse 74   Temp 98.4 F (36.9 C) (Oral)   Ht 5' 1 (1.549 m)   Wt 150 lb 6.4 oz (68.2 kg)   SpO2 98%   BMI 28.42 kg/m    Subjective:    Patient ID: Lindsey Bautista, female    DOB: November 04, 1960, 63 y.o.   MRN: [id number]  HPI: Lindsey Bautista is a 63 y.o. female  Chief Complaint  Patient presents with   Anxiety   COPD   Fibromyalgia   Hyperlipidemia   Hypertension   Fall    Patient states she has had 3 falls in the last 2 weeks. States if she bends down, she cannot stand up by herself. States she saw cardio earlier this week and they are going to be checking for blockages in her legs    Night Sweats    Patient states she has been sweating a lot more than usual. States she was only sweating at night but is starting to sweat during the day.    Concern for UTI, would like urine checked.  HYPERLIPIDEMIA Taking Atorvastatin  and Zetia . History of CVA, >11 years ago.  She is concerned about weight gain, her friend takes Phentermine and she would like to take this.   Hyperlipidemia status: good compliance Satisfied with current treatment?  yes Side effects:  no Medication compliance: good compliance Supplements: none Aspirin :  yes The ASCVD Risk score (Arnett DK, et al., 2019) failed to calculate for the following reasons:   Risk score cannot be calculated because patient has a medical history suggesting prior/existing ASCVD Chest pain:  no Coronary artery disease:  no Family history CAD:  yes Family history early CAD:  no   COPD Centrilobular & paraseptal emphysema and aortic atherosclerosis.  Last CT screening 10/11/22 and remains stable -- scheduled for 10/16/23. No current inhalers.  Has some congestion and ears have been bothering her.   Currently smoking <1 PPD, has smoked for >40 years.  Reports has not been smoking as much and continues to cut back. COPD status: stable Satisfied with current treatment?: yes Oxygen use: no Dyspnea frequency: no Cough  frequency: no Rescue inhaler frequency: none Limitation of activity: no Productive cough: none Last Spirometry: unknown Pneumovax:  refuses Influenza: Up to Date   FIBROMYALGIA, ALPHA GAL, B12 DEFICIENCY Saw rheumatology in past, last 04/04/22 -- reports they were a waste of time.  Has had multiple falls over past weeks and reports sweating more then baseline, started more sweating at night and now during day too. Has had worsening weakness in legs bilaterally and gets off balance a lot.  Cardiology is sending for ABIs.  Underlying Alpa Gal present.  Weight gain and increased sweating has started since she began cutting back on smoking.  Has had 3 falls over past two weeks, one episode she had squatted down and tried to get back up and legs would not get her back up.  Can be walking okay and next thing she walks into wall.  MRI on 07/07/23 noted no acute findings and mild chronic microvascular ischemia disease.  Continues B12 and Vitamin D  supplements. Duration: months Pain: yes Symmetric: yes -- R>L = 10/10 at worst when present Quality: dull, aching, and throbbing Frequency: constant Context:  worse Decreased function/range of motion: yes Erythema: none Swelling: yes  Heat or warmth: none Morning stiffness: yes Aggravating factors: lifting heavy Alleviating factors: nothing Relief with NSAIDs?: No NSAIDs Taken Treatments attempted:  rest and heat  Involved Joints:     Hands: yes bilateral    Wrists: yes bilateral     Elbows: yes bilateral    Shoulders: yes bilateral    Back: yes     Hips: yes bilateral    Knees: yes bilateral    Ankles: yes bilateral    Feet: yes bilateral   DEPRESSION/ANXIETY  Taking Prozac  40 MG daily and Klonopin  1 MG BID, she has wanted to increase benzo dosing in past but was aware would need to go to psychiatry for this.  Pt is aware of risks of benzo medication use to include increased sedation, respiratory suppression, falls, dependence and  cardiovascular events.  Pt would like to continue treatment as benefit determined to outweigh risk -- has been on this for many years.  PDMP last Klonopin  fill 08/04/23.  No other controlled substances noted.  She refuses psychiatry -- states she saw them in past and does not want to return. Mood status: stable Satisfied with current treatment?: yes Symptom severity: mild  Duration of current treatment : chronic Side effects: no Medication compliance: good compliance Psychotherapy/counseling: none Previous psychiatric medications: Prozac  and Klonopin  Depressed mood: occasional Anxious mood: yes Anhedonia: no Significant weight loss or gain: no Insomnia: yes hard to fall asleep  Fatigue: occasional Feelings of worthlessness or guilt: none Impaired concentration/indecisiveness: yes Suicidal ideations: no Hopelessness: no Crying spells: no    08/31/2023    1:12 PM 05/30/2023    1:17 PM 03/02/2023    1:23 PM 10/09/2022    1:10 PM 08/30/2022    2:59 PM  Depression screen PHQ 2/9  Decreased Interest 1 2 3  0 2  Down, Depressed, Hopeless 2 2 2 1 3   PHQ - 2 Score 3 4 5 1 5   Altered sleeping 1 2 2  0 1  Tired, decreased energy 1 3 3 1 3   Change in appetite 2 2 3  0 1  Feeling bad or failure about yourself  2 2 3  0 2  Trouble concentrating 1 2 2 1 1   Moving slowly or fidgety/restless 2 0 2 0 0  Suicidal thoughts 0 0 0 0 0  PHQ-9 Score 12 15 20 3 13   Difficult doing work/chores Very difficult Very difficult Very difficult Not difficult at all Very difficult      08/31/2023    1:12 PM 05/30/2023    1:17 PM 03/02/2023    1:23 PM 08/30/2022    3:00 PM  GAD 7 : Generalized Anxiety Score  Nervous, Anxious, on Edge 2 3 3 3   Control/stop worrying 2 3 2 3   Worry too much - different things 2 3 2 3   Trouble relaxing 2 1 2 3   Restless 1 2 2 3   Easily annoyed or irritable 2 2 3 3   Afraid - awful might happen 2 2 3 3   Total GAD 7 Score 13 16 17 21   Anxiety Difficulty Very difficult Very  difficult Very difficult Very difficult   Relevant past medical, surgical, family and social history reviewed and updated as indicated. Interim medical history since our last visit reviewed. Allergies and medications reviewed and updated.  Review of Systems  Constitutional:  Negative for activity change, appetite change, diaphoresis, fatigue and fever.  Respiratory:  Negative for cough, chest tightness, shortness of breath and wheezing.   Cardiovascular:  Negative for chest pain, palpitations and leg swelling.  Gastrointestinal: Negative.   Endocrine: Negative for cold intolerance and heat intolerance.  Musculoskeletal:  Positive for arthralgias.  Neurological: Negative.  Psychiatric/Behavioral:  Negative for decreased concentration, self-injury, sleep disturbance and suicidal ideas. The patient is nervous/anxious.    Per HPI unless specifically indicated above     Objective:    BP 132/73   Pulse 74   Temp 98.4 F (36.9 C) (Oral)   Ht 5' 1 (1.549 m)   Wt 150 lb 6.4 oz (68.2 kg)   SpO2 98%   BMI 28.42 kg/m   Wt Readings from Last 3 Encounters:  08/31/23 150 lb 6.4 oz (68.2 kg)  08/28/23 149 lb 2 oz (67.6 kg)  05/30/23 148 lb 6.4 oz (67.3 kg)    Physical Exam Vitals and nursing note reviewed.  Constitutional:      General: She is awake. She is not in acute distress.    Appearance: She is well-developed, well-groomed and overweight. She is not ill-appearing or toxic-appearing.  HENT:     Head: Normocephalic.     Right Ear: Hearing and external ear normal.     Left Ear: Hearing and external ear normal.  Eyes:     General: Lids are normal.        Right eye: No discharge.        Left eye: No discharge.     Extraocular Movements: Extraocular movements intact.     Conjunctiva/sclera: Conjunctivae normal.     Pupils: Pupils are equal, round, and reactive to light.     Visual Fields: Right eye visual fields normal and left eye visual fields normal.  Neck:     Thyroid : No  thyromegaly.     Vascular: No carotid bruit.  Cardiovascular:     Rate and Rhythm: Normal rate and regular rhythm.     Heart sounds: Normal heart sounds. No murmur heard.    No gallop.  Pulmonary:     Effort: Pulmonary effort is normal. No accessory muscle usage or respiratory distress.     Breath sounds: Normal breath sounds. No decreased breath sounds or wheezing.  Chest:  Breasts:    Right: Normal.     Left: Normal.  Abdominal:     General: Bowel sounds are normal.     Palpations: Abdomen is soft.     Tenderness: There is no right CVA tenderness, left CVA tenderness, guarding or rebound.     Hernia: No hernia is present.  Musculoskeletal:     Cervical back: Normal range of motion and neck supple.     Right lower leg: No edema.     Left lower leg: No edema.  Lymphadenopathy:     Cervical: No cervical adenopathy.     Upper Body:     Right upper body: No supraclavicular, axillary or pectoral adenopathy.     Left upper body: No supraclavicular, axillary or pectoral adenopathy.  Skin:    General: Skin is warm and dry.  Neurological:     Mental Status: She is alert and oriented to person, place, and time.     Cranial Nerves: Cranial nerves 2-12 are intact.     Sensory: Sensation is intact.     Motor: Motor function is intact.     Coordination: Coordination is intact.     Gait: Gait is intact.     Deep Tendon Reflexes: Reflexes are normal and symmetric.     Reflex Scores:      Brachioradialis reflexes are 2+ on the right side and 2+ on the left side.      Patellar reflexes are 2+ on the right side and 2+ on the left  side. Psychiatric:        Attention and Perception: Attention normal.        Mood and Affect: Mood normal.        Speech: Speech normal.        Behavior: Behavior normal. Behavior is cooperative.        Thought Content: Thought content normal.    Results for orders placed or performed in visit on 08/31/23  Urinalysis, Routine w reflex microscopic   Collection  Time: 08/31/23  1:14 PM  Result Value Ref Range   Specific Gravity, UA 1.025 1.005 - 1.030   pH, UA 6.5 5.0 - 7.5   Color, UA Yellow Yellow   Appearance Ur Clear Clear   Leukocytes,UA Negative Negative   Protein,UA Negative Negative/Trace   Glucose, UA Negative Negative   Ketones, UA Negative Negative   RBC, UA Negative Negative   Bilirubin, UA Negative Negative   Urobilinogen, Ur 0.2 0.2 - 1.0 mg/dL   Nitrite, UA Negative Negative   Microscopic Examination Comment       Assessment & Plan:   Problem List Items Addressed This Visit       Cardiovascular and Mediastinum   Aortic atherosclerosis (HCC)   Chronic, ongoing, noted on October 2019 lung screening CT.  Continue statin and ASA daily + recommend complete cessation of smoking.      Relevant Orders   Comprehensive metabolic panel   Lipid Panel w/o Chol/HDL Ratio   Coronary artery disease, non-occlusive   Chronic, stable.  Continue current medication regimen and collaboration with cardiology.      Relevant Orders   Comprehensive metabolic panel   TSH   Lipid Panel w/o Chol/HDL Ratio     Respiratory   Centrilobular emphysema (HCC)   Chronic, ongoing.  No current inhaler regimen.  Denies any symptoms.  Continue yearly lung screening and plan on spirometry next visit to assess function.  Recommend complete cessation of smoking.  Will send in Azithromycin  and Prednisone  for current sinus symptoms since going into weekend and bad weather + symptoms over 7 days.      Relevant Medications   azithromycin  (ZITHROMAX ) 250 MG tablet   predniSONE  (DELTASONE ) 20 MG tablet   Other Relevant Orders   CBC with Differential/Platelet     Other   Anxiety   Chronic, ongoing.  Continue Klonopin  at this time, patient educated on risks of medication and is not interested in reducing -- refills sent in.  Discussed we would not increase this, unless she saw psychiatry.  Continue Prozac  40 MG which benefits anxiety and fibromyalgia,  discussed with her changing to Zoloft or Duloxetine (which would benefit chronic pain issues) if increase anxiety/depression noted -- refuses.  At this time she denies SI/HI and wishes to maintain current regimen.  UDS yearly (next 08/30/24) and contract next visit.  Return in 3 months.       Relevant Medications   clonazePAM  (KLONOPIN ) 1 MG tablet   Other Relevant Orders   TSH   [id number] 11+Oxyco+Alc+Crt-Bund   Benzodiazepine dependence, continuous (HCC)   Refer to anxiety plan for updates and discussion. UDS obtained today, due next 08/30/24.  Contract next visit.      Relevant Orders   [id number] 11+Oxyco+Alc+Crt-Bund   Depression, recurrent (HCC) - Primary   Chronic, ongoing.  Continue current medication regimen and adjust as needed.  Denies SI/HI.  Consider change from Prozac  to Cymbalta in future, which would offer benefit with anxiety and depression + fibromyalgia/alpha gal -- she  refuses this change today.  Very anxious about changes.  Referral to therapy placed in past, she never attended.  Continues to be concerned about cancer and health.      Relevant Orders   [id number] 11+Oxyco+Alc+Crt-Bund   Dietary vitamin B12 deficiency anemia   Chronic, ongoing, continue daily supplement and adjust as needed.  Check level today.      Relevant Orders   Vitamin B12   Fibromyalgia   Chronic, ongoing.  Recommended she try stopping Prozac  and change to Duloxetine which would benefit mood and pain, she refuses this - fearful of change.  Continue Trazodone as needed.  Tried Gabapentin  in past and did not like this.      Relevant Medications   predniSONE  (DELTASONE ) 20 MG tablet   clonazePAM  (KLONOPIN ) 1 MG tablet   Other Relevant Orders   TSH   High risk medication use   Long term benzo use.  Obtain UDS today (next 08/30/2024) and obtain contract next visit.      Relevant Orders   [id number] 11+Oxyco+Alc+Crt-Bund   Hyperlipidemia   Chronic, ongoing.  Continue current medication regimen and  collaboration with cardiology.  Lipid panel today.      Nicotine  dependence, cigarettes, uncomplicated   I have recommended complete cessation of tobacco use. I have discussed various options available for assistance with tobacco cessation including over the counter methods (Nicotine  gum, patch and lozenges). We also discussed prescription options (Chantix, Nicotine  Inhaler / Nasal Spray). The patient is not interested in pursuing any prescription tobacco cessation options at this time.  Discussed with her she may notice symptoms as she reduces smoking, including occasional sweats or weight gain.       Vitamin D  deficiency   Chronic, ongoing.  Continue daily supplement and adjust as needed.  Check level today.      Relevant Orders   VITAMIN D  25 Hydroxy (Vit-D Deficiency, Fractures)   Other Visit Diagnoses       Urinary frequency       UA overall negative.   Relevant Orders   Urinalysis, Routine w reflex microscopic (Completed)        Follow up plan: Return in about 3 months (around 11/29/2023) for ANXIETY.

## 2023-09-01 ENCOUNTER — Other Ambulatory Visit: Payer: Self-pay | Admitting: Nurse Practitioner

## 2023-09-01 LAB — CBC WITH DIFFERENTIAL/PLATELET
Basophils Absolute: 0.1 10*3/uL (ref 0.0–0.2)
Basos: 1 %
EOS (ABSOLUTE): 0.1 10*3/uL (ref 0.0–0.4)
Eos: 1 %
Hematocrit: 39.9 % (ref 34.0–46.6)
Hemoglobin: 13.2 g/dL (ref 11.1–15.9)
Immature Grans (Abs): 0 10*3/uL (ref 0.0–0.1)
Immature Granulocytes: 0 %
Lymphocytes Absolute: 1.9 10*3/uL (ref 0.7–3.1)
Lymphs: 39 %
MCH: 29.7 pg (ref 26.6–33.0)
MCHC: 33.1 g/dL (ref 31.5–35.7)
MCV: 90 fL (ref 79–97)
Monocytes Absolute: 0.4 10*3/uL (ref 0.1–0.9)
Monocytes: 9 %
Neutrophils Absolute: 2.5 10*3/uL (ref 1.4–7.0)
Neutrophils: 50 %
Platelets: 256 10*3/uL (ref 150–450)
RBC: 4.45 x10E6/uL (ref 3.77–5.28)
RDW: 12.6 % (ref 11.7–15.4)
WBC: 4.9 10*3/uL (ref 3.4–10.8)

## 2023-09-01 LAB — COMPREHENSIVE METABOLIC PANEL
ALT: 15 [IU]/L (ref 0–32)
AST: 22 [IU]/L (ref 0–40)
Albumin: 4.3 g/dL (ref 3.9–4.9)
Alkaline Phosphatase: 87 [IU]/L (ref 44–121)
BUN/Creatinine Ratio: 11 — ABNORMAL LOW (ref 12–28)
BUN: 10 mg/dL (ref 8–27)
Bilirubin Total: 0.4 mg/dL (ref 0.0–1.2)
CO2: 21 mmol/L (ref 20–29)
Calcium: 9.4 mg/dL (ref 8.7–10.3)
Chloride: 101 mmol/L (ref 96–106)
Creatinine, Ser: 0.91 mg/dL (ref 0.57–1.00)
Globulin, Total: 2.3 g/dL (ref 1.5–4.5)
Glucose: 55 mg/dL — ABNORMAL LOW (ref 70–99)
Potassium: 4.3 mmol/L (ref 3.5–5.2)
Sodium: 140 mmol/L (ref 134–144)
Total Protein: 6.6 g/dL (ref 6.0–8.5)
eGFR: 71 mL/min/{1.73_m2} (ref >59)

## 2023-09-01 LAB — LIPID PANEL W/O CHOL/HDL RATIO
Cholesterol, Total: 151 mg/dL (ref 100–199)
HDL: 53 mg/dL (ref >39)
LDL Chol Calc (NIH): 81 mg/dL (ref 0–99)
Triglycerides: 89 mg/dL (ref 0–149)
VLDL Cholesterol Cal: 17 mg/dL (ref 5–40)

## 2023-09-01 LAB — VITAMIN D 25 HYDROXY (VIT D DEFICIENCY, FRACTURES): Vit D, 25-Hydroxy: 19 ng/mL — ABNORMAL LOW (ref 30.0–100.0)

## 2023-09-01 LAB — TSH: TSH: 1.28 u[IU]/mL (ref 0.450–4.500)

## 2023-09-01 LAB — VITAMIN B12: Vitamin B-12: 336 pg/mL (ref 232–1245)

## 2023-09-01 MED ORDER — VITAMIN B-12 1000 MCG PO TABS: 1000.0000 ug | ORAL_TABLET | Freq: Every day | ORAL | 4 refills | Status: DC

## 2023-09-01 MED ORDER — CHOLECALCIFEROL 50 MCG (2000 UT) PO TABS: 1.0000 | ORAL_TABLET | Freq: Every day | ORAL | 4 refills | Status: DC

## 2023-09-01 NOTE — Progress Notes (Signed)
 Good morning, please let Lamees know her labs have returned: - Kidney and liver function remain normal.  Electrolytes are stable with exception of sugar was a little low, ensure to get some healthy snacks during daytime hours. - CBC shows no findings concerning for anemia or infection. - Lipid panel continues to show stable levels -- continue statin therapy. - Vitamin D  is low, I have sent in supplement for her to take daily for this to help bone health.  Vitmain B12 is on low  side of normal.  I have sent in supplement for this to help overall nervous system.  Low levels of B12 can cause increased fatigue, body aches, memory fog, and much more.  Please start taking both supplements daily. Any questions? Keep being amazing!!  Thank you for allowing me to participate in your care.  I appreciate you. Kindest regards, Raeann Offner

## 2023-09-03 LAB — DRUG SCREEN 764883 11+OXYCO+ALC+CRT-BUND

## 2023-09-03 NOTE — Telephone Encounter (Signed)
 Requested medication (s) are due for refill today - no  Requested medication (s) are on the active medication list -yes  Future visit scheduled -yes  Last refill: 08/31/23 #60 2RF  Notes to clinic: non delegated Rx, duplicate request  Requested Prescriptions  Pending Prescriptions Disp Refills   clonazePAM  (KLONOPIN ) 1 MG tablet [Pharmacy Med Name: clonazePAM  1 MG Oral Tablet] 60 tablet 0    Sig: Take 1 tablet by mouth twice daily     Not Delegated - Psychiatry: Anxiolytics/Hypnotics 2 Failed - 09/03/2023  3:55 PM      Failed - This refill cannot be delegated      Failed - Urine Drug Screen completed in last 360 days      Passed - Patient is not pregnant      Passed - Valid encounter within last 6 months    Recent Outpatient Visits           3 days ago Depression, recurrent (HCC)   [place] [facility] [place], [place] T, NP   3 months ago Depression, recurrent (HCC)   [place] [facility] [place], Melanie T, NP   6 months ago Centrilobular emphysema (HCC)   [place] [facility] [place], Melanie T, NP   9 months ago Centrilobular emphysema (HCC)   [place] [facility] [place], [place] T, NP   10 months ago Bacterial vaginosis   [place] [facility] [place], Melanie DASEN, NP       Future Appointments             In 2 months [name], Jolene T, NP [place] [facility], [facility]            Signed Prescriptions Disp Refills   EPINEPHRINE  0.3 mg/0.3 mL IJ SOAJ injection 2 each 0    Sig: INJECT CONTENTS OF 1 PEN INTO THE MUSCLE AS NEEDED FOR ALLERGIC REACTION     Immunology: Antidotes Passed - 09/03/2023  3:55 PM      Passed - Valid encounter within last 12 months    Recent Outpatient Visits           3 days ago Depression, recurrent (HCC)   [place] [facility] [place], [place] T, NP   3 months ago Depression, recurrent (HCC)   [place] [facility]  [place], Melanie T, NP   6 months ago Centrilobular emphysema (HCC)   [place] [facility] [place], Melanie T, NP   9 months ago Centrilobular emphysema (HCC)   [place] [facility] [place], [place] T, NP   10 months ago Bacterial vaginosis   [place] [facility] [place], Melanie DASEN, NP       Future Appointments             In 2 months [name], Jolene T, NP  [facility], [facility]             nitroGLYCERIN  (NITROSTAT ) 0.4 MG SL tablet 50 tablet 0    Sig: DISSOLVE ONE TABLET UNDER THE TONGUE EVERY 5 MINUTES AS NEEDED FOR CHEST PAIN.  DO NOT EXCEED A TOTAL OF 3 DOSES IN 15 MINUTES     Cardiovascular:  Nitrates Passed - 09/03/2023  3:55 PM      Passed - Last BP in normal range    BP Readings from Last 1 Encounters:  08/31/23 132/73         Passed - Last Heart Rate in normal  range    Pulse Readings from Last 1 Encounters:  08/31/23 74         Passed - Valid encounter within last 12 months    Recent Outpatient Visits           3 days ago Depression, recurrent (HCC)   [place] [facility] [place], Melanie T, NP   3 months ago Depression, recurrent (HCC)   [place] [facility] [place], Melanie T, NP   6 months ago Centrilobular emphysema (HCC)   [place] [facility] [place], Melanie T, NP   9 months ago Centrilobular emphysema (HCC)   [place] [facility] [place], Melanie T, NP   10 months ago Bacterial vaginosis   [place] [facility] [place], Melanie DASEN, NP       Future Appointments             In 2 months [name], Jolene T, NP [place] [facility], [facility]               Requested Prescriptions  Pending Prescriptions Disp Refills   clonazePAM  (KLONOPIN ) 1 MG tablet [Pharmacy Med Name: clonazePAM  1 MG Oral Tablet] 60 tablet 0    Sig: Take 1 tablet by mouth twice daily     Not Delegated - Psychiatry:  Anxiolytics/Hypnotics 2 Failed - 09/03/2023  3:55 PM      Failed - This refill cannot be delegated      Failed - Urine Drug Screen completed in last 360 days      Passed - Patient is not pregnant      Passed - Valid encounter within last 6 months    Recent Outpatient Visits           3 days ago Depression, recurrent (HCC)   [place] [facility] [place], [place] T, NP   3 months ago Depression, recurrent (HCC)   [place] [facility] [place], Melanie T, NP   6 months ago Centrilobular emphysema (HCC)   [place] [facility] [place], Melanie T, NP   9 months ago Centrilobular emphysema (HCC)   [place] [facility] [place], [place] T, NP   10 months ago Bacterial vaginosis   [place] [facility] [place], Melanie DASEN, NP       Future Appointments             In 2 months [name], Melanie DASEN, NP [place] [facility]            Signed Prescriptions Disp Refills   EPINEPHRINE  0.3 mg/0.3 mL IJ SOAJ injection 2 each 0    Sig: INJECT CONTENTS OF 1 PEN INTO THE MUSCLE AS NEEDED FOR ALLERGIC REACTION     Immunology: Antidotes Passed - 09/03/2023  3:55 PM      Passed - Valid encounter within last 12 months    Recent Outpatient Visits           3 days ago Depression, recurrent (HCC)   [place] [facility] [place], [place] T, NP   3 months ago Depression, recurrent (HCC)   [place] [facility] [place], Melanie T, NP   6 months ago Centrilobular emphysema (HCC)   [place] [facility] [place], Melanie T, NP   9 months ago Centrilobular emphysema (HCC)   [place] [facility] [place], Melanie T, NP   10 months ago Bacterial vaginosis    [facility] [place], Melanie DASEN, NP       Future Appointments             In 2 months [name], Jolene T, NP [place] [facility]              nitroGLYCERIN  (NITROSTAT ) 0.4 MG SL tablet 50 tablet 0    Sig: DISSOLVE ONE TABLET UNDER THE TONGUE EVERY 5 MINUTES AS NEEDED FOR CHEST PAIN.  DO NOT EXCEED A TOTAL OF 3 DOSES IN 15 MINUTES     Cardiovascular:  Nitrates Passed - 09/03/2023  3:55 PM      Passed - Last BP in normal range    BP Readings from Last 1 Encounters:  08/31/23 132/73         Passed - Last Heart Rate in normal range    Pulse Readings from Last 1 Encounters:  08/31/23 74         Passed - Valid encounter within last 12 months    Recent Outpatient Visits           3 days ago Depression, recurrent (HCC)   [place] [facility] [place], [place] T, NP   3 months ago Depression, recurrent (HCC)   [place] [facility] [place], Melanie T, NP   6 months ago Centrilobular emphysema (HCC)   [place] [facility] [place], Melanie T, NP   9 months ago Centrilobular emphysema (HCC)   [place] [facility] [place], Melanie T, NP   10 months ago Bacterial vaginosis   [place] [facility] [place], Melanie DASEN, NP       Future Appointments             In 2 months [name], Jolene T, NP [place] [facility], [facility]

## 2023-09-03 NOTE — Telephone Encounter (Signed)
 Requested Prescriptions  Pending Prescriptions Disp Refills   clonazePAM  (KLONOPIN ) 1 MG tablet [Pharmacy Med Name: clonazePAM  1 MG Oral Tablet] 60 tablet 0    Sig: Take 1 tablet by mouth twice daily     Not Delegated - Psychiatry: Anxiolytics/Hypnotics 2 Failed - 09/03/2023  3:54 PM      Failed - This refill cannot be delegated      Failed - Urine Drug Screen completed in last 360 days      Passed - Patient is not pregnant      Passed - Valid encounter within last 6 months    Recent Outpatient Visits           3 days ago Depression, recurrent (HCC)   [place] [facility] [place], Lew[place] T, NP   3 months ago Depression, recurrent (HCC)   [place] [facility] [place], Melanie T, NP   6 months ago Centrilobular emphysema (HCC)   [place] [facility] [place], Melanie T, NP   9 months ago Centrilobular emphysema (HCC)   [place] [facility] [place], [place] T, NP   10 months ago Bacterial vaginosis   [place] [facility] [place], Melanie DASEN, NP       Future Appointments             In 2 months [name], Jolene T, NP [place] [facility]             EPINEPHRINE  0.3 mg/0.3 mL IJ SOAJ injection [Pharmacy Med Name: EPINEPHrine  0.3 MG/0.3ML Injection Solution Auto-injector] 2 each 0    Sig: INJECT CONTENTS OF 1 PEN INTO THE MUSCLE AS NEEDED FOR ALLERGIC REACTION     Immunology: Antidotes Passed - 09/03/2023  3:54 PM      Passed - Valid encounter within last 12 months    Recent Outpatient Visits           3 days ago Depression, recurrent (HCC)   [place] [facility] [place], Byrnedale T, NP   3 months ago Depression, recurrent (HCC)   [place] [facility] [place], Melanie T, NP   6 months ago Centrilobular emphysema (HCC)   [place] [facility] [place], Melanie T, NP   9 months ago Centrilobular emphysema (HCC)   [place] [facility] [place], [place] T, NP   10 months ago Bacterial vaginosis   [place] [facility] [place], Melanie DASEN, NP       Future Appointments             In 2 months [name], Jolene T, NP [place] [facility]             nitroGLYCERIN  (NITROSTAT ) 0.4 MG SL tablet [Pharmacy Med Name: Nitroglycerin  0.4 MG Sublingual Tablet Sublingual] 50 tablet 0    Sig: DISSOLVE ONE TABLET UNDER THE TONGUE EVERY 5 MINUTES AS NEEDED FOR CHEST PAIN.  DO NOT EXCEED A TOTAL OF 3 DOSES IN 15 MINUTES     Cardiovascular:  Nitrates Passed - 09/03/2023  3:54 PM      Passed - Last BP in normal range    BP Readings from Last 1 Encounters:  08/31/23 132/73         Passed - Last Heart Rate in normal range    Pulse Readings from Last 1 Encounters:  08/31/23 74         Passed - Valid encounter within last 12 months  Recent Outpatient Visits           3 days ago Depression, recurrent (HCC)   [place] [facility] [place], Melanie T, NP   3 months ago Depression, recurrent (HCC)   [place] [facility] [place], Melanie DASEN, NP   6 months ago Centrilobular emphysema (HCC)   [place] [facility] [place], Melanie T, NP   9 months ago Centrilobular emphysema (HCC)   [place] [facility] [place], Melanie T, NP   10 months ago Bacterial vaginosis   [place] [facility] [place], Melanie DASEN, NP       Future Appointments             In 2 months [name], Jolene T, NP [place] [facility]

## 2023-09-05 LAB — BENZODIAZEPINES CONFIRM, URINE
Alprazolam: NEGATIVE
Benzodiazepines: POSITIVE ng/mL — AB
Clonazepam Conf.: 836 ng/mL
Clonazepam: POSITIVE — AB
Flurazepam: NEGATIVE
Lorazepam: NEGATIVE
Midazolam: NEGATIVE
Nordiazepam: NEGATIVE
Oxazepam: NEGATIVE
Temazepam: NEGATIVE
Triazolam: NEGATIVE

## 2023-09-05 LAB — DRUG SCREEN 764883 11+OXYCO+ALC+CRT-BUND
Creatinine: 92 mg/dL (ref 20.0–300.0)
pH, Urine: 5.9 (ref 4.5–8.9)

## 2023-09-13 ENCOUNTER — Ambulatory Visit: Payer: Medicare Other | Attending: Cardiovascular Disease

## 2023-09-13 DIAGNOSIS — I739 Peripheral vascular disease, unspecified: Secondary | ICD-10-CM | POA: Insufficient documentation

## 2023-09-16 LAB — VAS US ABI WITH/WO TBI
Left ABI: 1.13
Right ABI: 1.08

## 2023-09-20 ENCOUNTER — Other Ambulatory Visit: Payer: Self-pay | Admitting: Nurse Practitioner

## 2023-09-20 DIAGNOSIS — Z1231 Encounter for screening mammogram for malignant neoplasm of breast: Secondary | ICD-10-CM

## 2023-10-16 ENCOUNTER — Ambulatory Visit: Payer: Medicare Other

## 2023-10-16 ENCOUNTER — Ambulatory Visit
Admission: RE | Admit: 2023-10-16 | Discharge: 2023-10-16 | Disposition: A | Discharge status: Home / Self Care | Payer: Medicare Other | Source: Ambulatory Visit | Attending: Acute Care | Admitting: Acute Care

## 2023-10-16 ENCOUNTER — Ambulatory Visit
Admission: RE | Admit: 2023-10-16 | Discharge: 2023-10-16 | Disposition: A | Discharge status: Home / Self Care | Payer: Medicare Other | Source: Ambulatory Visit | Attending: Nurse Practitioner | Admitting: Nurse Practitioner

## 2023-10-16 DIAGNOSIS — Z87891 Personal history of nicotine dependence: Secondary | ICD-10-CM | POA: Insufficient documentation

## 2023-10-16 DIAGNOSIS — Z1231 Encounter for screening mammogram for malignant neoplasm of breast: Secondary | ICD-10-CM | POA: Diagnosis not present

## 2023-10-16 DIAGNOSIS — Z122 Encounter for screening for malignant neoplasm of respiratory organs: Secondary | ICD-10-CM | POA: Insufficient documentation

## 2023-10-16 DIAGNOSIS — F17210 Nicotine dependence, cigarettes, uncomplicated: Secondary | ICD-10-CM | POA: Insufficient documentation

## 2023-11-01 ENCOUNTER — Ambulatory Visit (INDEPENDENT_AMBULATORY_CARE_PROVIDER_SITE_OTHER): Payer: Medicare Other | Admitting: Emergency Medicine

## 2023-11-01 VITALS — Ht 61.0 in | Wt 150.0 lb

## 2023-11-01 DIAGNOSIS — Z0000 Encounter for general adult medical examination without abnormal findings: Secondary | ICD-10-CM

## 2023-11-01 NOTE — Progress Notes (Signed)
 Subjective:   Lindsey Bautista is a 63 y.o. who presents for a Medicare Wellness preventive visit.  Visit Complete: Virtual I connected with  Lindsey Bautista on 11/01/23 by a audio enabled telemedicine application and verified that I am speaking with the correct person using two identifiers.  Patient Location: Home  Provider Location: Home Office  I discussed the limitations of evaluation and management by telemedicine. The patient expressed understanding and agreed to proceed.  Vital Signs: Because this visit was a virtual/telehealth visit, some criteria may be missing or patient reported. Any vitals not documented were not able to be obtained and vitals that have been documented are patient reported.  VideoDeclined- This patient declined Librarian, academic. Therefore the visit was completed with audio only.  AWV Questionnaire: No: Patient Medicare AWV questionnaire was not completed prior to this visit.  Cardiac Risk Factors include: advanced age (>84men, >80 women);smoking/ tobacco exposure;dyslipidemia;Other (see comment), Risk factor comments: CAD     Objective:    Today's Vitals   11/01/23 0928  Weight: 150 lb (68 kg)  Height: 5\' 1"  (1.549 m)  PainSc: 7    Body mass index is 28.34 kg/m.     11/01/2023    9:40 AM 07/07/2023    4:58 PM 11/04/2022   12:35 PM 11/01/2022    6:54 PM 10/09/2022    1:19 PM 07/17/2022    5:00 PM 05/18/2022   12:38 PM  Advanced Directives  Does Patient Have a Medical Advance Directive? No No No No No No No  Would patient like information on creating a medical advance directive? Yes (MAU/Ambulatory/Procedural Areas - Information given) No - Patient declined No - Patient declined No - Patient declined No - Patient declined No - Patient declined No - Patient declined    Current Medications (verified) Outpatient Encounter Medications as of 11/01/2023  Medication Sig   atorvastatin (LIPITOR) 80 MG tablet Take 1 tablet (80  mg total) by mouth daily.   clonazePAM (KLONOPIN) 1 MG tablet Take 1 tablet (1 mg total) by mouth 2 (two) times daily.   dimenhyDRINATE (DRAMAMINE) 50 MG tablet Take 50 mg by mouth every 8 (eight) hours as needed.   docusate sodium (COLACE) 100 MG capsule Take 100 mg by mouth daily.    EPINEPHRINE 0.3 mg/0.3 mL IJ SOAJ injection INJECT CONTENTS OF 1 PEN INTO THE MUSCLE AS NEEDED FOR ALLERGIC REACTION   ezetimibe (ZETIA) 10 MG tablet Take 1 tablet (10 mg total) by mouth daily.   FLUoxetine (PROZAC) 40 MG capsule Take 1 capsule (40 mg total) by mouth daily.   meclizine (ANTIVERT) 25 MG tablet Take 1 tablet (25 mg total) by mouth 3 (three) times daily as needed.   nitroGLYCERIN (NITROSTAT) 0.4 MG SL tablet DISSOLVE ONE TABLET UNDER THE TONGUE EVERY 5 MINUTES AS NEEDED FOR CHEST PAIN.  DO NOT EXCEED A TOTAL OF 3 DOSES IN 15 MINUTES   tiZANidine (ZANAFLEX) 2 MG tablet Take 1 tablet (2 mg total) by mouth every 8 (eight) hours as needed for muscle spasms.   Cholecalciferol 50 MCG (2000 UT) TABS Take 1 tablet (2,000 Units total) by mouth daily. (Patient not taking: Reported on 11/01/2023)   cyanocobalamin (VITAMIN B12) 1000 MCG tablet Take 1 tablet (1,000 mcg total) by mouth daily. (Patient not taking: Reported on 11/01/2023)   No facility-administered encounter medications on file as of 11/01/2023.    Allergies (verified) Amoxicillin, Flagyl [metronidazole], Penicillin g, Iodinated contrast media, Alpha-gal, and Flomax [tamsulosin hcl]  History: Past Medical History:  Diagnosis Date   Arthritis    Atypical chest pain    a. 08/2016 Neg MV.   COPD (chronic obstructive pulmonary disease) (HCC)    Coronary artery disease, non-occlusive    a.  LHC 06/19/2018: Mid LAD 50% with iFR 0.94   Depression    Diastolic dysfunction    a. 10/2016 Echo: EF 60-65%, no rwma, Gr1 DD, mild MR, nl LA size, nl RV fxn; b. 05/2018 Echo: EF 60-65%. No rwma. Mild MR. Nl RV fxn.   Fibromyalgia    GERD (gastroesophageal  reflux disease)    Hypotension    IBS (irritable bowel syndrome)    Right arm weakness    Situational syncope    Stroke (HCC)    2013, 2018.  Some short term memory issues, some weakness   TIA (transient ischemic attack)    Wears dentures    full upper   Past Surgical History:  Procedure Laterality Date   CATARACT EXTRACTION W/PHACO Right 05/04/2022   Procedure: CATARACT EXTRACTION PHACO AND INTRAOCULAR LENS PLACEMENT (IOC) RIGHT 3.89 00:47.4;  Surgeon: Estanislado Pandy, MD;  Location[facility];  Service: Ophthalmology;  Laterality: Right;   CATARACT EXTRACTION W/PHACO Left 05/18/2022   Procedure: CATARACT EXTRACTION PHACO AND INTRAOCULAR LENS PLACEMENT (IOC) LEFT 1.44 00:19.7;  Surgeon: Estanislado Pandy, MD;  Location: [facility];  Service: Ophthalmology;  Laterality: Left;   COLONOSCOPY WITH PROPOFOL N/A 11/22/2015   Procedure: COLONOSCOPY WITH PROPOFOL;  Surgeon: Scot Jun, MD;  Location: [facility];  Service: Endoscopy;  Laterality: N/A;   CORONARY PRESSURE/FFR STUDY N/A 06/19/2018   Procedure: INTRAVASCULAR PRESSURE WIRE/FFR STUDY;  Surgeon: Yvonne Kendall, MD;  Location: [facility] INVASIVE CV LAB;  Service: Cardiovascular;  Laterality: N/A;   ESOPHAGOGASTRODUODENOSCOPY (EGD) WITH PROPOFOL N/A 11/22/2015   Procedure: ESOPHAGOGASTRODUODENOSCOPY (EGD) WITH PROPOFOL;  Surgeon: Scot Jun, MD;  Location: [facility] END[facility];  Service: Endoscopy;  Laterality: N/A;   LEFT HEART CATH AND CORONARY ANGIOGRAPHY N/A 06/19/2018   Procedure: LEFT HEART CATH AND CORONARY ANGIOGRAPHY;  Surgeon: Yvonne Kendall, MD;  Location: [facility] INVASIVE CV LAB;  Service: Cardiovascular;  Laterality: N/A;   OOPHORECTOMY     TONSILLECTOMY     VAGINAL HYSTERECTOMY     Family History  Problem Relation Age of Onset   Bladder Cancer Mother 24   Heart failure Mother    Renal Disease Mother    COPD Father    Bladder Cancer Maternal Aunt    Bladder Cancer Maternal Aunt     Breast cancer Maternal Aunt    Breast cancer Paternal Aunt 18   Colon cancer Maternal Grandmother    Cancer Paternal Grandfather        unk primary, metastatic, d 46s   Heart disease Other    COPD Other    Stroke Other    Prostate cancer Cousin        metastatic   Prostate cancer Cousin    Social History   Socioeconomic History   Marital status: Married    Spouse name: Neena Rhymes Koike   Number of children: 2   Years of education: 12   Highest education level: High school graduate  Occupational History   Occupation: Unemployed   Occupation: disability  Tobacco Use   Smoking status: Every Day    Current packs/day: 0.50    Average packs/day: 0.5 packs/day for 46.2 years (23.1 ttl pk-yrs)    Types: Cigarettes    Start date: 89  Smokeless tobacco: Never   Tobacco comments:    Started smoking age 63    Smoking 1/2 PPD; 05/22/2023.  Vaping Use   Vaping status: Never Used  Substance and Sexual Activity   Alcohol use: No    Alcohol/week: 0.0 standard drinks of alcohol   Drug use: No   Sexual activity: Not Currently    Birth control/protection: Surgical    Comment: Hysterectomy  Other Topics Concern   Not on file  Social History Narrative   Lives at home with husband.    Caffeine use: 1 cup coffee per day   Social Drivers of Health   Financial Resource Strain: Low Risk  (11/01/2023)   Overall Financial Resource Strain (CARDIA)    Difficulty of Paying Living Expenses: Not hard at all  Food Insecurity: No Food Insecurity (11/01/2023)   Hunger Vital Sign    Worried About Running Out of Food in the Last Year: Never true    Ran Out of Food in the Last Year: Never true  Transportation Needs: No Transportation Needs (11/01/2023)   PRAPARE - Administrator, Civil Service (Medical): No    Lack of Transportation (Non-Medical): No  Physical Activity: Insufficiently Active (11/01/2023)   Exercise Vital Sign    Days of Exercise per Week: 7 days    Minutes of  Exercise per Session: 10 min  Stress: Stress Concern Present (11/01/2023)   [employer] of Occupational Health - Occupational Stress Questionnaire    Feeling of Stress : To some extent  Social Connections: Moderately Isolated (11/01/2023)   Social Connection and Isolation Panel [NHANES]    Frequency of Communication with Friends and Family: More than three times a week    Frequency of Social Gatherings with Friends and Family: Once a week    Attends Religious Services: Never    Database administrator or Organizations: No    Attends Banker Meetings: Never    Marital Status: Married    Tobacco Counseling Ready to quit: Not Answered Counseling given: Not Answered Tobacco comments: Started smoking age 59 Smoking 1/2 PPD; 05/22/2023.    Clinical Intake:  Pre-visit preparation completed: Yes  Pain : 0-10 Pain Score: 7  Pain Type: Chronic pain Pain Location: Back Pain Descriptors / Indicators: Aching     BMI - recorded: 28.34 Nutritional Status: BMI 25 -29 Overweight Nutritional Risks: Nausea/ vomitting/ diarrhea (nausea) Diabetes: No  How often do you need to have someone help you when you read instructions, pamphlets, or other written materials from your doctor or pharmacy?: 1 - Never  Interpreter Needed?: No  Information entered by :: Tora Kindred, CMA   Activities of Daily Living     11/01/2023    9:30 AM 11/04/2022   12:35 PM  In your present state of health, do you have any difficulty performing the following activities:  Hearing? 1 0  Comment some, established with Dr. Elenore Rota. Will call to make appointment for eval.   Vision? 0 0  Difficulty concentrating or making decisions? 1 0  Walking or climbing stairs? 1 0  Comment pain in legs   Dressing or bathing? 0 0  Doing errands, shopping? 1 0  Comment sometimes   Preparing Food and eating ? N   Using the Toilet? N   In the past six months, have you accidently leaked urine? Y   Comment  sometimes   Managing your Medications? N   Managing your Finances? N   Housekeeping or managing your  Housekeeping? N     Patient Care Team: [name], NP as PCP - General (Nurse Practitioner) Antonieta Iba, MD as PCP - Cardiology (Cardiology) Abisogun, Albin Felling, MD as Consulting Physician (Endocrinology) Antonieta Iba, MD as Consulting Physician (Cardiology) Gustavus Bryant, [name] as Social Worker (Licensed Clinical Social Worker) Rolley Sims, Rolly Pancake, MD as Consulting Physician (Ophthalmology)  Indicate any recent Medical Services you may have received from other than [facility] providers in the past year (date may be approximate).     Assessment:   This is a routine wellness examination for Keishla.  Hearing/Vision screen Hearing Screening - Comments:: Some hearing loss, patient established with Dr. Elenore Rota. Will call to make appointment Vision Screening - Comments:: Gets eye exams, Dr Deberah Pelton [facility] [place]   Goals Addressed             This Visit's Progress    Patient Stated       Be more active       Depression Screen     11/01/2023    9:38 AM 08/31/2023    1:12 PM 05/30/2023    1:17 PM 03/02/2023    1:23 PM 10/09/2022    1:10 PM 08/30/2022    2:59 PM 06/09/2022    1:40 PM  PHQ 2/9 Scores  PHQ - 2 Score 2 3 4 5 1 5 3   PHQ- 9 Score 7 12 15 20 3 13 12     Fall Risk     11/01/2023    9:44 AM 05/30/2023    1:16 PM 03/02/2023    1:22 PM 10/09/2022    1:20 PM 08/30/2022    2:53 PM  Fall Risk   Falls in the past year? 1 1 1 1 1   Number falls in past yr: 1 0 0 0 1  Injury with Fall? 0 0 0 0 1  Risk for fall due to : History of fall(s);Impaired balance/gait;Orthopedic patient;Impaired mobility No Fall Risks History of fall(s) History of fall(s) History of fall(s)  Follow up Falls prevention discussed;Falls evaluation completed;Education provided Falls evaluation completed Falls evaluation completed Falls evaluation completed;Falls  prevention discussed Falls evaluation completed    MEDICARE RISK AT HOME:  Medicare Risk at Home Any stairs in or around the home?: Yes If so, are there any without handrails?: No Home free of loose throw rugs in walkways, pet beds, electrical cords, etc?: Yes Adequate lighting in your home to reduce risk of falls?: Yes Life alert?: No Use of a cane, walker or w/c?: No Grab bars in the bathroom?: No Shower chair or bench in shower?: No Elevated toilet seat or a handicapped toilet?: Yes  TIMED UP AND GO:  Was the test performed?  No  Cognitive Function: 6CIT completed        11/01/2023    9:45 AM 08/30/2022    3:24 PM 09/27/2020    2:05 PM 05/27/2018    1:40 PM 05/09/2017    1:27 PM  6CIT Screen  What Year? 0 points 0 points 0 points 0 points 0 points  What month? 0 points 0 points 0 points 0 points 0 points  What time? 0 points 0 points 0 points 0 points 0 points  Count back from 20 0 points 0 points 0 points 0 points 0 points  Months in reverse 0 points 0 points 2 points 0 points 0 points  Repeat phrase 0 points 0 points 8 points 2 points 6 points  Total Score 0 points  0 points 10 points 2 points 6 points    Immunizations Immunization History  Administered Date(s) Administered   Influenza,inj,Quad PF,6+ Mos 06/21/2015, 05/09/2017   Moderna Sars-Covid-2 Vaccination 05/09/2020, 06/06/2020   Td 08/21/2001    Screening Tests Health Maintenance  Topic Date Due   Lung Cancer Screening  10/12/2023   INFLUENZA VACCINE  11/19/2023 (Originally 03/22/2023)   Pneumococcal Vaccine 42-40 Years old (1 of 2 - PCV) 08/30/2024 (Originally 03/29/1967)   MAMMOGRAM  10/15/2024   Medicare Annual Wellness (AWV)  10/31/2024   Colonoscopy  02/12/2025   Hepatitis C Screening  Completed   HIV Screening  Completed   HPV VACCINES  Aged Out   DTaP/Tdap/Td  Discontinued   COVID-19 Vaccine  Discontinued   Zoster Vaccines- Shingrix  Discontinued    Health Maintenance  Health Maintenance Due   Topic Date Due   Lung Cancer Screening  10/12/2023   Health Maintenance Items Addressed: See Nurse Notes  Additional Screening:  Vision Screening: Recommended annual ophthalmology exams for early detection of glaucoma and other disorders of the eye.  Dental Screening: Recommended annual dental exams for proper oral hygiene  Community Resource Referral / Chronic Care Management: CRR required this visit?  No   CCM required this visit?  No     Plan:     I have personally reviewed and noted the following in the patient's chart:   Medical and social history Use of alcohol, tobacco or illicit drugs  Current medications and supplements including opioid prescriptions. Patient is not currently taking opioid prescriptions. Functional ability and status Nutritional status Physical activity Advanced directives List of other physicians Hospitalizations, surgeries, and ER visits in previous 12 months Vitals Screenings to include cognitive, depression, and falls Referrals and appointments  In addition, I have reviewed and discussed with patient certain preventive protocols, quality metrics, and best practice recommendations. A written personalized care plan for preventive services as well as general preventive health recommendations were provided to patient.     Tora Kindred, CMA   11/01/2023   After Visit Summary: (Mail) Due to this being a telephonic visit, the after visit summary with patients personalized plan was offered to patient via mail   Notes:  Declined all vaccines Had LDCT on 10/16/23-results pending Some hearing loss. Patient states she is established with Dr. Elenore Rota and will call his office to schedule appt for evaluation.

## 2023-11-01 NOTE — Patient Instructions (Addendum)
 [name] , Thank you for taking time to come for your Medicare Wellness Visit. I appreciate your ongoing commitment to your health goals. Please review the following plan we discussed and let me know if I can assist you in the future.   Referrals/Orders/Follow-Ups/Clinician Recommendations: Call Dr. Wyn Forster office to schedule appointment for a hearing evaluation.  This is a list of the screening recommended for you and due dates:  Health Maintenance  Topic Date Due   Screening for Lung Cancer  10/12/2023   Flu Shot  11/19/2023*   Pneumococcal Vaccination (1 of 2 - PCV) 08/30/2024*   Mammogram  10/15/2024   Medicare Annual Wellness Visit  10/31/2024   Colon Cancer Screening  02/12/2025   Hepatitis C Screening  Completed   HIV Screening  Completed   HPV Vaccine  Aged Out   DTaP/Tdap/Td vaccine  Discontinued   COVID-19 Vaccine  Discontinued   Zoster (Shingles) Vaccine  Discontinued  *Topic was postponed. The date shown is not the original due date.    Advanced directives: (Provided) Advance directive discussed with you today. I have provided a copy for you to complete at home and have notarized. Once this is complete, please bring a copy in to our office so we can scan it into your chart.   Next Medicare Annual Wellness Visit scheduled for next year: Yes, 11/13/24 @ 9:20am (phone visit)  Fall Prevention in the Home, Adult Falls can cause injuries and affect people of all ages. There are many simple things that you can do to make your home safe and to help prevent falls. If you need it, ask for help making these changes. What actions can I take to prevent falls? General information Use good lighting in all rooms. Make sure to: Replace any light bulbs that burn out. Turn on lights if it is dark and use night-lights. Keep items that you use often in easy-to-reach places. Lower the shelves around your home if needed. Move furniture so that there are clear paths around it. Do not keep  throw rugs or other things on the floor that can make you trip. If any of your floors are uneven, fix them. Add color or contrast paint or tape to clearly mark and help you see: Grab bars or handrails. First and last steps of staircases. Where the edge of each step is. If you use a ladder or stepladder: Make sure that it is fully opened. Do not climb a closed ladder. Make sure the sides of the ladder are locked in place. Have someone hold the ladder while you use it. Know where your pets are as you move through your home. What can I do in the bathroom?     Keep the floor dry. Clean up any water that is on the floor right away. Remove soap buildup in the bathtub or shower. Buildup makes bathtubs and showers slippery. Use non-skid mats or decals on the floor of the bathtub or shower. Attach bath mats securely with double-sided, non-slip rug tape. If you need to sit down while you are in the shower, use a non-slip stool. Install grab bars by the toilet and in the bathtub and shower. Do not use towel bars as grab bars. What can I do in the bedroom? Make sure that you have a light by your bed that is easy to reach. Do not use any sheets or blankets on your bed that hang to the floor. Have a firm bench or chair with side arms that  you can use for support when you get dressed. What can I do in the kitchen? Clean up any spills right away. If you need to reach something above you, use a sturdy step stool that has a grab bar. Keep electrical cables out of the way. Do not use floor polish or wax that makes floors slippery. What can I do with my stairs? Do not leave anything on the stairs. Make sure that you have a light switch at the top and the bottom of the stairs. Have them installed if you do not have them. Make sure that there are handrails on both sides of the stairs. Fix handrails that are broken or loose. Make sure that handrails are as long as the staircases. Install non-slip stair  treads on all stairs in your home if they do not have carpet. Avoid having throw rugs at the top or bottom of stairs, or secure the rugs with carpet tape to prevent them from moving. Choose a carpet design that does not hide the edge of steps on the stairs. Make sure that carpet is firmly attached to the stairs. Fix any carpet that is loose or worn. What can I do on the outside of my home? Use bright outdoor lighting. Repair the edges of walkways and driveways and fix any cracks. Clear paths of anything that can make you trip, such as tools or rocks. Add color or contrast paint or tape to clearly mark and help you see high doorway thresholds. Trim any bushes or trees on the main path into your home. Check that handrails are securely fastened and in good repair. Both sides of all steps should have handrails. Install guardrails along the edges of any raised decks or porches. Have leaves, snow, and ice cleared regularly. Use sand, salt, or ice melt on walkways during winter months if you live where there is ice and snow. In the garage, clean up any spills right away, including grease or oil spills. What other actions can I take? Review your medicines with your health care provider. Some medicines can make you confused or feel dizzy. This can increase your chance of falling. Wear closed-toe shoes that fit well and support your feet. Wear shoes that have rubber soles and low heels. Use a cane, walker, scooter, or crutches that help you move around if needed. Talk with your provider about other ways that you can decrease your risk of falls. This may include seeing a physical therapist to learn to do exercises to improve movement and strength. Where to find more information Centers for Disease Control and Prevention, [employer]: TonerPromos.no [employer] on Aging: BaseRingTones.pl National Institute on Aging: BaseRingTones.pl Contact a health care provider if: You are afraid of falling at home. You feel weak,  drowsy, or dizzy at home. You fall at home. Get help right away if you: Lose consciousness or have trouble moving after a fall. Have a fall that causes a head injury. These symptoms may be an emergency. Get help right away. Call 911. Do not wait to see if the symptoms will go away. Do not drive yourself to the hospital. This information is not intended to replace advice given to you by your health care provider. Make sure you discuss any questions you have with your health care provider. Document Revised: 04/10/2022 Document Reviewed: 04/10/2022 Elsevier Patient Education  2024 [employer].

## 2023-11-15 ENCOUNTER — Telehealth: Payer: Self-pay | Admitting: Acute Care

## 2023-11-15 ENCOUNTER — Other Ambulatory Visit: Payer: Self-pay

## 2023-11-15 DIAGNOSIS — Z122 Encounter for screening for malignant neoplasm of respiratory organs: Secondary | ICD-10-CM

## 2023-11-15 DIAGNOSIS — Z87891 Personal history of nicotine dependence: Secondary | ICD-10-CM

## 2023-11-15 DIAGNOSIS — F17210 Nicotine dependence, cigarettes, uncomplicated: Secondary | ICD-10-CM

## 2023-11-15 NOTE — Telephone Encounter (Signed)
 Spoke with patient and spouse. Reviewed results. Advised CT was read as a LR1 with two noted peripherally calcified splenic artery aneurysms, vascular surgery referral recommended. Mild cylindrical bronchiectasis  Age advanced right coronary artery calcification, Right adrenal adenoma, Aortic atherosclerosis and Emphysema also reviewed. Patient is on a statin and sees Dr. Mariah Milling for Cardiology. Pt advises she will follow up with her PCP Margarite Gouge, NP and Dr. Mariah Milling and discuss findings and recommendations to address the calcified splenic artery aneurysms. Results and plan sent to PCP and Cardiology for follow up. One year annual lung CT order has been placed.   Jolene and Dr. Hebert Soho, please let us know if you need any additional information.   Revonda Standard, RN

## 2023-11-15 NOTE — Telephone Encounter (Signed)
 Patient had LDCT on 10/16/2023 that resulted on 11/08/2023 as a LR1 with two noted peripherally calcified splenic artery aneurysms. Results reviewed with Kandice Robinsons NP and was advised to notify patient of findings with specific mention of the splenic artery aneurysms and inform pt a referral to vascular surgery is advised, this can come from [place] or PCP. Will also need to inform PCP of results. Patient can resume annual LCS, due 10/16/2024.     IMPRESSION: 1. Lung-RADS 1, negative. Continue annual screening with low-dose chest CT without contrast in 12 months. 2. Age advanced right coronary artery calcification. 3. Mild cylindrical bronchiectasis. 4. Right adrenal adenoma. 5. Two peripherally calcified splenic artery aneurysms. 6.  Aortic atherosclerosis (ICD10-I70.0). 7.  Emphysema (ICD10-J43.9).     Electronically Signed   By: Leanna Battles M.D.   On: 11/08/2023 13:17

## 2023-11-18 DIAGNOSIS — I728 Aneurysm of other specified arteries: Secondary | ICD-10-CM | POA: Insufficient documentation

## 2023-11-18 NOTE — Patient Instructions (Signed)
 Managing Anxiety, Adult  After being diagnosed with anxiety, you may be relieved to know why you have felt or behaved a certain way. You may also feel overwhelmed about the treatment ahead and what it will mean for your life. With care and support, you can manage your anxiety.  How to manage lifestyle changes  Understanding the difference between stress and anxiety  Although stress can play a role in anxiety, it is not the same as anxiety. Stress is your body's reaction to life changes and events, both good and bad. Stress is often caused by something external, such as a deadline, test, or competition. It normally goes away after the event has ended and will last just a few hours. But, stress can be ongoing and can lead to more than just stress.  Anxiety is caused by something internal, such as imagining a terrible outcome or worrying that something will go wrong that will greatly upset you. Anxiety often does not go away even after the event is over, and it can become a long-term (chronic) worry.  Lowering stress and anxiety    Talk with your health care provider or a counselor to learn more about lowering anxiety and stress. They may suggest tension-reduction techniques, such as:  Music. Spend time creating or listening to music that you enjoy and that inspires you.  Mindfulness-based meditation. Practice being aware of your normal breaths while not trying to control your breathing. It can be done while sitting or walking.  Centering prayer. Focus on a word, phrase, or sacred image that means something to you and brings you peace.  Deep breathing. Expand your stomach and inhale slowly through your nose. Hold your breath for 3-5 seconds. Then breathe out slowly, letting your stomach muscles relax.  Self-talk. Learn to notice and spot thought patterns that lead to anxiety reactions. Change those patterns to thoughts that feel peaceful.  Muscle relaxation. Take time to tense muscles and then relax them.  Choose a  tension-reduction technique that fits your lifestyle and personality. These techniques take time and practice. Set aside 5-15 minutes a day to do them. Specialized therapists can offer counseling and training in these techniques. The training to help with anxiety may be covered by some insurance plans.  Other things you can do to manage stress and anxiety include:  Keeping a stress diary. This can help you learn what triggers your reaction and then learn ways to manage your response.  Thinking about how you react to certain situations. You may not be able to control everything, but you can control your response.  Making time for activities that help you relax and not feeling guilty about spending your time in this way.  Doing visual imagery. This involves imagining or creating mental pictures to help you relax.  Practicing yoga. Through yoga poses, you can lower tension and relax.     Medicines  Medicines for anxiety include:  Antidepressant medicines. These are usually prescribed for long-term daily control.  Anti-anxiety medicines. These may be added in severe cases, especially when panic attacks occur.  When used together, medicines, psychotherapy, and tension-reduction techniques may be the most effective treatment.  Relationships  Relationships can play a big part in helping you recover. Spend more time connecting with trusted friends and family members. Think about going to couples counseling if you have a partner, taking family education classes, or going to family therapy. Therapy can help you and others better understand your anxiety.  How to recognize changes in  your anxiety  Everyone responds differently to treatment for anxiety. Recovery from anxiety happens when symptoms lessen and stop interfering with your daily life at home or work. This may mean that you will start to:  Have better concentration and focus. Worry will interfere less in your daily thinking.  Sleep better.  Be less irritable.  Have  more energy.  Have improved memory.  Try to recognize when your condition is getting worse. Contact your provider if your symptoms interfere with home or work and you feel like your condition is not improving.  Follow these instructions at home:  Activity  Exercise. Adults should:  Exercise for at least 150 minutes each week. The exercise should increase your heart rate and make you sweat (moderate-intensity exercise).  Do strengthening exercises at least twice a week.  Get the right amount and quality of sleep. Most adults need 7-9 hours of sleep each night.  Lifestyle    Eat a healthy diet that includes plenty of vegetables, fruits, whole grains, low-fat dairy products, and lean protein.  Do not eat a lot of foods that are high in fats, added sugars, or salt (sodium).  Make choices that simplify your life.  Do not use any products that contain nicotine or tobacco. These products include cigarettes, chewing tobacco, and vaping devices, such as e-cigarettes. If you need help quitting, ask your provider.  Avoid caffeine, alcohol, and certain over-the-counter cold medicines. These may make you feel worse. Ask your pharmacist which medicines to avoid.  General instructions  Take over-the-counter and prescription medicines only as told by your provider.  Keep all follow-up visits. This is to make sure you are managing your anxiety well or if you need more support.  Where to find support  You can get help and support from:  Self-help groups.  Online and [employer].  A trusted spiritual leader.  Couples counseling.  Family education classes.  Family therapy.  Where to find more information  You may find that joining a support group helps you deal with your anxiety. The following sources can help you find counselors or support groups near you:  Mental Health America: mentalhealthamerica.net  Anxiety and Depression Association of [place] (ADAA): adaa.org  [employer] on Mental Illness (NAMI):  nami.org  Contact a health care provider if:  You have a hard time staying focused or finishing tasks.  You spend many hours a day feeling worried about everyday life.  You are very tired because you cannot stop worrying.  You start to have headaches or often feel tense.  You have chronic nausea or diarrhea.  Get help right away if:  Your heart feels like it is racing.  You have shortness of breath.  You have thoughts of hurting yourself or others.  Get help right away if you feel like you may hurt yourself or others, or have thoughts about taking your own life. Go to your nearest emergency room or:  Call 911.  Call the National Suicide Prevention Lifeline at 2043122231 or 988. This is open 24 hours a day.  Text the Crisis Text Line at (832)170-7952.  This information is not intended to replace advice given to you by your health care provider. Make sure you discuss any questions you have with your health care provider.  Document Revised: 05/16/2022 Document Reviewed: 11/28/2020  Elsevier Patient Education  2024 [employer].

## 2023-11-27 ENCOUNTER — Encounter: Payer: Self-pay | Admitting: Nurse Practitioner

## 2023-11-27 ENCOUNTER — Telehealth: Payer: Self-pay | Admitting: Cardiovascular Disease

## 2023-11-27 ENCOUNTER — Ambulatory Visit (INDEPENDENT_AMBULATORY_CARE_PROVIDER_SITE_OTHER): Payer: Self-pay | Admitting: Nurse Practitioner

## 2023-11-27 VITALS — BP 135/68 | HR 65 | Temp 98.7°F | Ht 61.0 in | Wt 147.6 lb

## 2023-11-27 DIAGNOSIS — F447 Conversion disorder with mixed symptom presentation: Secondary | ICD-10-CM | POA: Diagnosis not present

## 2023-11-27 DIAGNOSIS — F1320 Sedative, hypnotic or anxiolytic dependence, uncomplicated: Secondary | ICD-10-CM | POA: Diagnosis not present

## 2023-11-27 DIAGNOSIS — M5442 Lumbago with sciatica, left side: Secondary | ICD-10-CM

## 2023-11-27 DIAGNOSIS — M5441 Lumbago with sciatica, right side: Secondary | ICD-10-CM

## 2023-11-27 DIAGNOSIS — R300 Dysuria: Secondary | ICD-10-CM

## 2023-11-27 DIAGNOSIS — F419 Anxiety disorder, unspecified: Secondary | ICD-10-CM

## 2023-11-27 DIAGNOSIS — I2510 Atherosclerotic heart disease of native coronary artery without angina pectoris: Secondary | ICD-10-CM

## 2023-11-27 DIAGNOSIS — I728 Aneurysm of other specified arteries: Secondary | ICD-10-CM

## 2023-11-27 DIAGNOSIS — F17210 Nicotine dependence, cigarettes, uncomplicated: Secondary | ICD-10-CM

## 2023-11-27 DIAGNOSIS — F339 Major depressive disorder, recurrent, unspecified: Secondary | ICD-10-CM

## 2023-11-27 DIAGNOSIS — G8929 Other chronic pain: Secondary | ICD-10-CM

## 2023-11-27 LAB — MICROSCOPIC EXAMINATION
Bacteria, UA: NONE SEEN
Epithelial Cells (non renal): NONE SEEN /HPF (ref 0–10)

## 2023-11-27 LAB — URINALYSIS, ROUTINE W REFLEX MICROSCOPIC
Bilirubin, UA: NEGATIVE
Glucose, UA: NEGATIVE
Ketones, UA: NEGATIVE
Leukocytes,UA: NEGATIVE
Nitrite, UA: NEGATIVE
Protein,UA: NEGATIVE
Specific Gravity, UA: 1.02 (ref 1.005–1.030)
Urobilinogen, Ur: 0.2 mg/dL (ref 0.2–1.0)
pH, UA: 5.5 (ref 5.0–7.5)

## 2023-11-27 MED ORDER — CLONAZEPAM 1 MG PO TABS: 1.0000 mg | ORAL_TABLET | Freq: Two times a day (BID) | ORAL | 2 refills | Status: DC

## 2023-11-27 NOTE — Progress Notes (Signed)
 BP 135/68   Pulse 65   Temp 98.7 F (37.1 C) (Oral)   Ht 5\' 1"  (1.549 m)   Wt 147 lb 9.6 oz (67 kg)   SpO2 98%   BMI 27.89 kg/m    Subjective:    Patient ID: UNNAMED Lindsey Bautista, female    DOB: 03/12/1961, 63 y.o.   MRN: [id number]  HPI: Lindsey Bautista is a 63 y.o. female  Chief Complaint  Patient presents with   Anxiety   Urinary Tract Infection    Patient states she has been noticing some burning with urination and bright yellow color to her urine. States this has been going on for the last few weeks.    Recent lung screening for smoking noted ongoing emphysema + coronary artery calcification.  Two peripherally calcified splenic artery aneurysms, no symptoms.  Continues to smoke 1/2 PPD.   Has ongoing back pain that radiates down legs, L>R.  Numbness and tingly. Last imaging was in 2012 of thoracic spine, which noted scoliosis.  Pain makes her hurt so bad she sometimes does not eat.  ANXIETY/STRESS Taking Prozac 40 MG daily and Klonopin 1 MG BID, she has wanted to increase benzo dosing in past but is aware she would need to go to psychiatry for this.  Pt is aware of risks of benzo medication use to include increased sedation, respiratory suppression, falls, dependence and cardiovascular events.  Pt would like to continue treatment as benefit determined to outweigh risk. Has been on this for many years, started by previous PCP. PDMP last Klonopin fill 10/30/23.  No other controlled substances noted. Refuses psychiatry, reports she saw them in past and does not want to return.  Duration:stable Anxious mood: yes  Excessive worrying: yes Irritability: yes  Sweating: no Nausea: no Palpitations:no Hyperventilation: no Panic attacks: no Agoraphobia: no  Obscessions/compulsions: no Depressed mood: yes    11/01/2023    9:38 AM 2023-09-18    1:12 PM 05/30/2023    1:17 PM 03/02/2023    1:23 PM 10/09/2022    1:10 PM  Depression screen PHQ 2/9  Decreased Interest 1 1 2 3  0  Down,  Depressed, Hopeless 1 2 2 2 1   PHQ - 2 Score 2 3 4 5 1   Altered sleeping 1 1 2 2  0  Tired, decreased energy 1 1 3 3 1   Change in appetite 1 2 2 3  0  Feeling bad or failure about yourself  1 2 2 3  0  Trouble concentrating 1 1 2 2 1   Moving slowly or fidgety/restless 0 2 0 2 0  Suicidal thoughts 0 0 0 0 0  PHQ-9 Score 7 12 15 20 3   Difficult doing work/chores Somewhat difficult Very difficult Very difficult Very difficult Not difficult at all  Anhedonia: no Weight changes: no Insomnia: yes hard to stay asleep  Hypersomnia: no Fatigue/loss of energy: no Feelings of worthlessness: occasional Feelings of guilt: no Impaired concentration/indecisiveness: no Suicidal ideations: no  Crying spells: yes Recent Stressors/Life Changes: no   Relationship problems: no   Family stress: no     Financial stress: no    Job stress: no    Recent death/loss: no     2023/09/18    1:12 PM 05/30/2023    1:17 PM 03/02/2023    1:23 PM 2022/09/17    3:00 PM  GAD 7 : Generalized Anxiety Score  Nervous, Anxious, on Edge 2 3 3 3   Control/stop worrying 2 3 2 3   Worry too  much - different things 2 3 2 3   Trouble relaxing 2 1 2 3   Restless 1 2 2 3   Easily annoyed or irritable 2 2 3 3   Afraid - awful might happen 2 2 3 3   Total GAD 7 Score 13 16 17 21   Anxiety Difficulty Very difficult Very difficult Very difficult Very difficult   URINARY SYMPTOMS Reports urinary symptoms present for 2 weeks.  History of recurrent BV and UTIs. Dysuria:  sometimes Urinary frequency: yes Urgency: yes Small volume voids: yes Symptom severity: no Urinary incontinence: yes on occasion Foul odor: yes Hematuria: no Abdominal pain: yes Back pain: yes Suprapubic pain/pressure: yes Flank pain: no Fever:  no Vomiting: yes Status: stable Previous urinary tract infection: yes Recurrent urinary tract infection: no Sexual activity: No sexually active History of sexually transmitted disease: no Treatments attempted: none     Relevant past medical, surgical, family and social history reviewed and updated as indicated. Interim medical history since our last visit reviewed. Allergies and medications reviewed and updated.  Review of Systems  Constitutional:  Negative for activity change, appetite change, diaphoresis, fatigue and fever.  Respiratory:  Negative for cough, chest tightness, shortness of breath and wheezing.   Cardiovascular:  Negative for chest pain, palpitations and leg swelling.  Gastrointestinal: Negative.   Endocrine: Negative for cold intolerance and heat intolerance.  Genitourinary:  Positive for dysuria, frequency and urgency. Negative for decreased urine volume, difficulty urinating, hematuria and vaginal discharge.  Musculoskeletal:  Positive for back pain.  Neurological: Negative.   Psychiatric/Behavioral:  Negative for decreased concentration, self-injury, sleep disturbance and suicidal ideas. The patient is nervous/anxious.    Per HPI unless specifically indicated above     Objective:    BP 135/68   Pulse 65   Temp 98.7 F (37.1 C) (Oral)   Ht 5\' 1"  (1.549 m)   Wt 147 lb 9.6 oz (67 kg)   SpO2 98%   BMI 27.89 kg/m   Wt Readings from Last 3 Encounters:  11/27/23 147 lb 9.6 oz (67 kg)  11/01/23 150 lb (68 kg)  08/31/23 150 lb 6.4 oz (68.2 kg)    Physical Exam Vitals and nursing note reviewed.  Constitutional:      General: She is awake. She is not in acute distress.    Appearance: She is well-developed, well-groomed and overweight. She is not ill-appearing or toxic-appearing.  HENT:     Head: Normocephalic.     Right Ear: Hearing and external ear normal.     Left Ear: Hearing and external ear normal.  Eyes:     General: Lids are normal.        Right eye: No discharge.        Left eye: No discharge.     Extraocular Movements: Extraocular movements intact.     Conjunctiva/sclera: Conjunctivae normal.     Pupils: Pupils are equal, round, and reactive to light.      Visual Fields: Right eye visual fields normal and left eye visual fields normal.  Neck:     Thyroid: No thyromegaly.     Vascular: No carotid bruit.  Cardiovascular:     Rate and Rhythm: Normal rate and regular rhythm.     Heart sounds: Normal heart sounds. No murmur heard.    No gallop.  Pulmonary:     Effort: Pulmonary effort is normal. No accessory muscle usage or respiratory distress.     Breath sounds: Normal breath sounds. No decreased breath sounds or wheezing.  Chest:  Breasts:    Right: Normal.     Left: Normal.  Abdominal:     General: Bowel sounds are normal.     Palpations: Abdomen is soft.     Tenderness: There is no right CVA tenderness, left CVA tenderness, guarding or rebound.     Hernia: No hernia is present.  Musculoskeletal:     Cervical back: Normal range of motion and neck supple.     Right lower leg: No edema.     Left lower leg: No edema.  Lymphadenopathy:     Cervical: No cervical adenopathy.     Upper Body:     Right upper body: No supraclavicular, axillary or pectoral adenopathy.     Left upper body: No supraclavicular, axillary or pectoral adenopathy.  Skin:    General: Skin is warm and dry.  Neurological:     Mental Status: She is alert and oriented to person, place, and time.     Cranial Nerves: Cranial nerves 2-12 are intact.     Sensory: Sensation is intact.     Motor: Motor function is intact.     Coordination: Coordination is intact.     Gait: Gait is intact.     Deep Tendon Reflexes: Reflexes are normal and symmetric.     Reflex Scores:      Brachioradialis reflexes are 2+ on the right side and 2+ on the left side.      Patellar reflexes are 2+ on the right side and 2+ on the left side. Psychiatric:        Attention and Perception: Attention normal.        Mood and Affect: Mood normal.        Speech: Speech normal.        Behavior: Behavior normal. Behavior is cooperative.        Thought Content: Thought content normal.     Results  for orders placed or performed in visit on 11/27/23  Microscopic Examination   Collection Time: 11/27/23  1:18 PM   Urine  Result Value Ref Range   WBC, UA 0-5 0 - 5 /hpf   RBC, Urine 0-2 0 - 2 /hpf   Epithelial Cells (non renal) None seen 0 - 10 /hpf   Bacteria, UA None seen None seen/Few  Urinalysis, Routine w reflex microscopic   Collection Time: 11/27/23  1:18 PM  Result Value Ref Range   Specific Gravity, UA 1.020 1.005 - 1.030   pH, UA 5.5 5.0 - 7.5   Color, UA Yellow Yellow   Appearance Ur Hazy (A) Clear   Leukocytes,UA Negative Negative   Protein,UA Negative Negative/Trace   Glucose, UA Negative Negative   Ketones, UA Negative Negative   RBC, UA Trace (A) Negative   Bilirubin, UA Negative Negative   Urobilinogen, Ur 0.2 0.2 - 1.0 mg/dL   Nitrite, UA Negative Negative   Microscopic Examination See below:       Assessment & Plan:   Problem List Items Addressed This Visit       Cardiovascular and Mediastinum   Aneurysm of splenic artery (HCC) - Primary   Noted on lung screening 10/16/23.  Will placed referral to vascular for recommendations and assessment.  No current symptoms.      Relevant Orders   Ambulatory referral to Vascular Surgery   Coronary artery disease, non-occlusive   Chronic, stable.  Continue current medication regimen and collaboration with cardiology.        Nervous and Auditory  Chronic midline low back pain with bilateral sciatica   Chronic, worsening.  No benefit from OTC medications or current scripts.  Will place referral to ortho for further recommendations.      Relevant Medications   clonazePAM (KLONOPIN) 1 MG tablet   Other Relevant Orders   Ambulatory referral to Orthopedic Surgery     Other   Anxiety   Chronic, ongoing.  Continue Klonopin at this time, patient educated on risks of medication and is not interested in reducing -- refills sent in.  Discussed we would not increase this, unless she saw psychiatry.  Continue Prozac  40 MG which benefits anxiety and fibromyalgia, discussed with her changing to Zoloft or Duloxetine (which would benefit chronic pain issues) if increase anxiety/depression noted -- refuses.  At this time she denies SI/HI and wishes to maintain current regimen.  UDS yearly (next 08/30/24) and contract next visit.  Return in 3 months.       Relevant Medications   clonazePAM (KLONOPIN) 1 MG tablet   Benzodiazepine dependence, continuous (HCC)   Refer to anxiety plan for updates and discussion. UDS obtained due next 08/30/24.  Contract next visit.      Conversion disorder with mixed symptoms   Refer to anxiety plan of care.      Depression, recurrent (HCC)   Chronic, ongoing.  Continue current medication regimen and adjust as needed.  Denies SI/HI.  Consider change from Prozac to Cymbalta in future, which would offer benefit with anxiety and depression + fibromyalgia/alpha gal -- she refuses this change today.  Very anxious about changes.  Referral to therapy placed in past, she never attended.  Continues to be concerned about health.      Nicotine dependence, cigarettes, uncomplicated   I have recommended complete cessation of tobacco use. I have discussed various options available for assistance with tobacco cessation including over the counter methods (Nicotine gum, patch and lozenges). We also discussed prescription options (Chantix, Nicotine Inhaler / Nasal Spray). The patient is not interested in pursuing any prescription tobacco cessation options at this time.  Discussed with her she may notice symptoms as she reduces smoking, including occasional sweats or weight gain.       Other Visit Diagnoses       Burning with urination       Urine overall reassuring, only trace blood noted.   Relevant Orders   Urinalysis, Routine w reflex microscopic (Completed)        Follow up plan: Return in about 3 months (around 02/26/2024) for ANXIETY, HLD, COPD.

## 2023-11-27 NOTE — Assessment & Plan Note (Signed)
 Noted on lung screening 10/16/23.  Will placed referral to vascular for recommendations and assessment.  No current symptoms.

## 2023-11-27 NOTE — Assessment & Plan Note (Signed)
 I have recommended complete cessation of tobacco use. I have discussed various options available for assistance with tobacco cessation including over the counter methods (Nicotine gum, patch and lozenges). We also discussed prescription options (Chantix, Nicotine Inhaler / Nasal Spray). The patient is not interested in pursuing any prescription tobacco cessation options at this time.  Discussed with her she may notice symptoms as she reduces smoking, including occasional sweats or weight gain.

## 2023-11-27 NOTE — Assessment & Plan Note (Signed)
 Chronic, worsening.  No benefit from OTC medications or current scripts.  Will place referral to ortho for further recommendations.

## 2023-11-27 NOTE — Assessment & Plan Note (Signed)
 Refer to anxiety plan of care.

## 2023-11-27 NOTE — Telephone Encounter (Signed)
 Patient calling to see about a test that was suppose to be sent to our office for Dr. Mariah Milling to review. Please advise

## 2023-11-27 NOTE — Telephone Encounter (Signed)
 Called and spoke with patient. Patient reports that she had a lung scan in February. Patient states that Pulmonary called her today and told her that based on the CT results she needed to be seen by her cardiologist. Patient does not recall what the results were. Patient scheduled in clinic 11/30/23.

## 2023-11-27 NOTE — Assessment & Plan Note (Signed)
 Chronic, ongoing.  Continue current medication regimen and adjust as needed.  Denies SI/HI.  Consider change from Prozac to Cymbalta in future, which would offer benefit with anxiety and depression + fibromyalgia/alpha gal -- she refuses this change today.  Very anxious about changes.  Referral to therapy placed in past, she never attended.  Continues to be concerned about health.

## 2023-11-27 NOTE — Assessment & Plan Note (Signed)
Chronic, stable.  Continue current medication regimen and collaboration with cardiology. 

## 2023-11-27 NOTE — Assessment & Plan Note (Signed)
 Chronic, ongoing.  Continue Klonopin at this time, patient educated on risks of medication and is not interested in reducing -- refills sent in.  Discussed we would not increase this, unless she saw psychiatry.  Continue Prozac 40 MG which benefits anxiety and fibromyalgia, discussed with her changing to Zoloft or Duloxetine (which would benefit chronic pain issues) if increase anxiety/depression noted -- refuses.  At this time she denies SI/HI and wishes to maintain current regimen.  UDS yearly (next 08/30/24) and contract next visit.  Return in 3 months.

## 2023-11-27 NOTE — Assessment & Plan Note (Signed)
 Refer to anxiety plan for updates and discussion. UDS obtained due next 08/30/24.  Contract next visit.

## 2023-11-30 ENCOUNTER — Encounter: Payer: Self-pay | Admitting: Cardiovascular Disease

## 2023-11-30 ENCOUNTER — Ambulatory Visit: Attending: Cardiovascular Disease | Admitting: Cardiovascular Disease

## 2023-11-30 VITALS — BP 150/60 | HR 64 | Ht 61.0 in | Wt 149.2 lb

## 2023-11-30 DIAGNOSIS — G8929 Other chronic pain: Secondary | ICD-10-CM | POA: Diagnosis present

## 2023-11-30 DIAGNOSIS — J432 Centrilobular emphysema: Secondary | ICD-10-CM | POA: Insufficient documentation

## 2023-11-30 DIAGNOSIS — R079 Chest pain, unspecified: Secondary | ICD-10-CM | POA: Insufficient documentation

## 2023-11-30 DIAGNOSIS — E785 Hyperlipidemia, unspecified: Secondary | ICD-10-CM | POA: Insufficient documentation

## 2023-11-30 DIAGNOSIS — R0602 Shortness of breath: Secondary | ICD-10-CM | POA: Insufficient documentation

## 2023-11-30 DIAGNOSIS — I6521 Occlusion and stenosis of right carotid artery: Secondary | ICD-10-CM | POA: Insufficient documentation

## 2023-11-30 DIAGNOSIS — I739 Peripheral vascular disease, unspecified: Secondary | ICD-10-CM | POA: Diagnosis not present

## 2023-11-30 DIAGNOSIS — I2510 Atherosclerotic heart disease of native coronary artery without angina pectoris: Secondary | ICD-10-CM | POA: Diagnosis not present

## 2023-11-30 DIAGNOSIS — Z720 Tobacco use: Secondary | ICD-10-CM | POA: Diagnosis not present

## 2023-11-30 DIAGNOSIS — I700 Atherosclerosis of aorta: Secondary | ICD-10-CM | POA: Diagnosis not present

## 2023-11-30 NOTE — Progress Notes (Signed)
 Cardiology Office Note  Date:  11/30/2023   ID:  Lindsey Bautista, DOB [birth year], MRN [id number]  PCP:  [name], NP   Chief Complaint  Patient presents with   Follow up recent chest CT scan     Patient c/o "sticking" sensation in chest with arm weakness, right hand swelling in the am taking several hours for the swelling to decrease.    HPI:  [name] is a 63 year old woman with history of  Medication noncompliance anxiety/depression,  Mild carotid disease long smoking history for close to 40 years, who continues to smoke 1/2 ppd stroke type symptoms in April 2015 where she could not talk for 4 days, again in April 2017 diagnosed with conversion disorder again in March 2018 CAD Hyperlipidemia Chronic chest pain, dyspnea on exertion 2019 diagnostic cath which showed 50% mid LAD stenosis with FFR being normal.  moderate diffuse PAD extending into the common iliac arteries  who presents for prior episode of syncope, chest pain.  Last seen by myself in clinic January 2025 Smoking 1/2 ppd Reports having chronic back pain No regular  exercise program  Occasional sticking sensation in the left chest When she wakes up chronic right hand swelling  Vertigo type symptoms  November 2024  emergency room July 07, 2023 for headache, dizziness Workup negative, was given IV fluids, meclizine, Reglan, Ativan Normal head MRI  Continues to smoke, 1/2 ppd smoking, smokes outside Difficulty quitting Prior hx of esophagus "stretching" x2  EKG personally reviewed by myself on todays visit EKG Interpretation Date/Time:  Friday November 30 2023 14:29:20 EDT Ventricular Rate:  64 PR Interval:  148 QRS Duration:  78 QT Interval:  432 QTC Calculation: 445 R Axis:   52  Text Interpretation: Normal sinus rhythm Normal ECG When compared with ECG of 28-Aug-2023 14:36, No significant change was found Confirmed by Julien Nordmann [phone]) on 11/30/2023 2:33:55 PM    Other past medical  history reviewed LE arterial doppler: 1/25 Normal ABIs  Stress test 06/2020: normal myoview  Stress test 2013, 2015, 2016, 2018  Concerned about carotids, last evaluated 2 years ago,  mild disease  Other past medical history reviewed hospital in 05/2018 with chest pain and ruled out.  diagnostic cath which showed 50% mid LAD stenosis with FFR being normal.  Echo  normal LV systolic function.   Headaches on Imdur  Stroke type symptoms April 2017 diagnosed with conversion disorder Stress test January 2018 for chest pain with no ischemia  Had  stroke type symptomsMarch 2018 Right-sided weakness, migraine She had chest pain, multiple problems, right-sided weakness, headache Carotid u/s mild disease MRI was negative. Started on for verapamil for migraine.   PMH:   has a past medical history of Arthritis, Atypical chest pain, COPD (chronic obstructive pulmonary disease) (HCC), Coronary artery disease, non-occlusive, Depression, Diastolic dysfunction, Fibromyalgia, GERD (gastroesophageal reflux disease), Hypotension, IBS (irritable bowel syndrome), Right arm weakness, Situational syncope, Stroke (HCC), TIA (transient ischemic attack), and Wears dentures.  PSH:    Past Surgical History:  Procedure Laterality Date   CATARACT EXTRACTION W/PHACO Right 05/04/2022   Procedure: CATARACT EXTRACTION PHACO AND INTRAOCULAR LENS PLACEMENT (IOC) RIGHT 3.89 00:47.4;  Surgeon: Estanislado Pandy, MD;  Location: [facility];  Service: Ophthalmology;  Laterality: Right;   CATARACT EXTRACTION W/PHACO Left 05/18/2022   Procedure: CATARACT EXTRACTION PHACO AND INTRAOCULAR LENS PLACEMENT (IOC) LEFT 1.44 00:19.7;  Surgeon: Estanislado Pandy, MD;  Location: [facility];  Service: Ophthalmology;  Laterality: Left;  COLONOSCOPY WITH PROPOFOL N/A 11/22/2015   Procedure: COLONOSCOPY WITH PROPOFOL;  Surgeon: Scot Jun, MD;  Location[facility];  Service: Endoscopy;  Laterality:  N/A;   CORONARY PRESSURE/FFR STUDY N/A 06/19/2018   Procedure: INTRAVASCULAR PRESSURE WIRE/FFR STUDY;  Surgeon: Yvonne Kendall, MD;  Location: [facility]IVE [facility];  Service: Cardiovascular;  Laterality: N/A;   ESOPHAGOGASTRODUODENOSCOPY (EGD) WITH PROPOFOL N/A 11/22/2015   Procedure: ESOPHAGOGASTRODUODENOSCOPY (EGD) WITH PROPOFOL;  Surgeon: Scot Jun, MD;  Location: [facility] ENDOSCOPY;  Service: Endoscopy;  Laterality: N/A;   LEFT HEART CATH AND CORONARY ANGIOGRAPHY N/A 06/19/2018   Procedure: LEFT HEART CATH AND CORONARY ANGIOGRAPHY;  Surgeon: Yvonne Kendall, MD;  Location: [facility] CV [facility];  Service: Cardiovascular;  Laterality: N/A;   OOPHORECTOMY     TONSILLECTOMY     VAGINAL HYSTERECTOMY      Current Outpatient Medications  Medication Sig Dispense Refill   atorvastatin (LIPITOR) 80 MG tablet Take 1 tablet (80 mg total) by mouth daily. 90 tablet 4   clonazePAM (KLONOPIN) 1 MG tablet Take 1 tablet (1 mg total) by mouth 2 (two) times daily. 60 tablet 2   dimenhyDRINATE (DRAMAMINE) 50 MG tablet Take 50 mg by mouth every 8 (eight) hours as needed.     docusate sodium (COLACE) 100 MG capsule Take 100 mg by mouth daily.      EPINEPHRINE 0.3 mg/0.3 mL IJ SOAJ injection INJECT CONTENTS OF 1 PEN INTO THE MUSCLE AS NEEDED FOR ALLERGIC REACTION 2 each 0   ezetimibe (ZETIA) 10 MG tablet Take 1 tablet (10 mg total) by mouth daily. 90 tablet 4   FLUoxetine (PROZAC) 40 MG capsule Take 1 capsule (40 mg total) by mouth daily. 90 capsule 4   meclizine (ANTIVERT) 25 MG tablet Take 1 tablet (25 mg total) by mouth 3 (three) times daily as needed. 15 tablet 0   nitroGLYCERIN (NITROSTAT) 0.4 MG SL tablet DISSOLVE ONE TABLET UNDER THE TONGUE EVERY 5 MINUTES AS NEEDED FOR CHEST PAIN.  DO NOT EXCEED A TOTAL OF 3 DOSES IN 15 MINUTES 50 tablet 0   tiZANidine (ZANAFLEX) 2 MG tablet Take 1 tablet (2 mg total) by mouth every 8 (eight) hours as needed for muscle spasms. 90 tablet 1   No current  facility-administered medications for this visit.     Allergies:   Amoxicillin, Flagyl [metronidazole], Penicillin g, Iodinated contrast media, Alpha-gal, and Flomax [tamsulosin hcl]   Social History:  The patient  reports that she has been smoking cigarettes. She started smoking about 46 years ago. She has a 23.1 pack-year smoking history. She has never used smokeless tobacco. She reports that she does not drink alcohol and does not use drugs.   Family History:   family history includes Bladder Cancer in her maternal aunt and maternal aunt; Bladder Cancer (age of onset: 33) in her mother; Breast cancer in her maternal aunt; Breast cancer (age of onset: 110) in her paternal aunt; COPD in her father and another family member; Cancer in her paternal grandfather; Colon cancer in her maternal grandmother; Heart disease in an other family member; Heart failure in her mother; Prostate cancer in her cousin and cousin; Renal Disease in her mother; Stroke in an other family member.    Review of Systems: Review of Systems  Constitutional: Negative.   HENT: Negative.    Respiratory: Negative.    Cardiovascular:  Positive for chest pain.  Gastrointestinal: Negative.   Musculoskeletal:  Positive for myalgias and neck pain.  Neurological: Negative.  Psychiatric/Behavioral: Negative.    All other systems reviewed and are negative.   PHYSICAL EXAM: VS:  BP (!) 150/60 (BP Location: Left Arm, Patient Position: Sitting, Cuff Size: Normal)   Pulse 64   Ht 5\' 1"  (1.549 m)   Wt 149 lb 4 oz (67.7 kg)   SpO2 98%   BMI 28.20 kg/m  , BMI Body mass index is 28.2 kg/m. Constitutional:  oriented to person, place, and time. No distress.  HENT:  Head: Grossly normal Eyes:  no discharge. No scleral icterus.  Neck: No JVD, no carotid bruits  Cardiovascular: Regular rate and rhythm, no murmurs appreciated Pulmonary/Chest: Clear to auscultation bilaterally, no wheezes or rails Abdominal: Soft.  no distension.   no tenderness.  Musculoskeletal: Normal range of motion Neurological:  normal muscle tone. Coordination normal. No atrophy Skin: Skin warm and dry Psychiatric: normal affect, pleasant   Recent Labs: 08/31/2023: ALT 15; BUN 10; Creatinine, Ser 0.91; Hemoglobin 13.2; Platelets 256; Potassium 4.3; Sodium 140; TSH 1.280    Lipid Panel Lab Results  Component Value Date   CHOL 151 08/31/2023   HDL 53 08/31/2023   LDLCALC 81 08/31/2023   TRIG 89 08/31/2023    Wt Readings from Last 3 Encounters:  11/30/23 149 lb 4 oz (67.7 kg)  11/27/23 147 lb 9.6 oz (67 kg)  11/01/23 150 lb (68 kg)      ASSESSMENT AND PLAN:  PAD (peripheral artery disease) (HCC) Moderate disease seen on CT scan  good ABIs 1/25  leg pain, weakness worse on ambulation Chronic leg tenderness consistent with fibromyalgia Continues to smoke, cessation recommended  Situational syncope No recent episodes of syncope stable  Abdominal aortic atherosclerosis (HCC) Smoking cessation recommended, aggressive lipid management Reports she is back on Lipitor 80 daily with Zetia 10 daily Cholesterol slightly above goal  Mixed hyperlipidemia - Plan: EKG 12-Lead Tolerating Zetia with Lipitor  Smoker - We have encouraged her to continue to work on weaning her cigarettes and smoking cessation. She will continue to work on this and does not want any assistance with chantix.    Centrilobular emphysema (HCC) Continues to smoke, 1/2 pack/day  chronic shortness of breath on exertion  Chest pressure  Atypical in nature Numerous stress test dating back to 2013 one a year or every other year Atypical poking in the left chest, no indication for additional ischemic workup  Depression/anxiety Managed by primary care  GERD Consider PPI "Burning vocal cords",  Followed by ENT  Vertigo Recommend meclizine as needed  Chronic back pain   Orders Placed This Encounter  Procedures   EKG 12-Lead      Signed, Dossie Arbour, M.D., Ph.D. 11/30/2023  [facility] [place], [place] [phone]

## 2023-11-30 NOTE — Patient Instructions (Addendum)
 Venetia Night, MD [facility]surgery at [facility] [address] [place], [place] [address] [phone]   Medication Instructions:  No changes  Monitor blood pressure  If you need a refill on your cardiac medications before your next appointment, please call your pharmacy.   Lab work: No new labs needed  Testing/Procedures: No new testing needed  Follow-Up: At [facility], you and your health needs are our priority.  As part of our continuing mission to provide you with exceptional heart care, we have created designated Provider Care Teams.  These Care Teams include your primary Cardiologist (physician) and Advanced Practice Providers (APPs -  Physician Assistants and Nurse Practitioners) who all work together to provide you with the care you need, when you need it.  You will need a follow up appointment in 12 months  Providers on your designated Care Team:   Nicolasa Ducking, NP Eula Listen, PA-C Cadence Fransico Michael, [place]  COVID-19 Vaccine Information can be found at: [email or web address] For questions related to vaccine distribution or appointments, please email [email or web address] or call [phone].

## 2023-12-03 ENCOUNTER — Telehealth: Payer: Self-pay | Admitting: Nurse Practitioner

## 2023-12-03 NOTE — Telephone Encounter (Signed)
 Copied from CRM [id number]. Topic: Referral - Status >> Dec 03, 2023  3:00 PM Bearl Botts E wrote: Reason for CRM: Pt called reporting that her cardiologist wants her to get a referral for ortho with Nance Aw at [facility]. And not the current ortho referral. Nance Aw comes highly recommended by Dr. Gollan.   Says she would rather see ortho than vascular. Apparently there are two referrals pending, says she has yet to hear from vascular (Dr. Cleopatra Dada?)

## 2023-12-03 NOTE — Telephone Encounter (Signed)
 Routing to referral team. Can the patient's referral be sent to [facility] as requested by the patient?

## 2023-12-05 NOTE — Telephone Encounter (Signed)
 Tried calling patient to get clarification on her request. No answer and no VM set up, will try to call again.   OK for E2C2 to speak to the patient and get clarification. Patient requested to have an ortho referral sent to Dr. Jeris Montes, but Dr. Jeris Montes is a neurosurgeon. Please get clarification from the patient about where it is that she wants to be sent to and who she would like to see.

## 2023-12-19 NOTE — Progress Notes (Addendum)
 Referring Physician:  Lemar Pyles, NP [address] [place],  [place] [address]  Primary Physician:  Lemar Pyles, NP  History of Present Illness: 12/20/2023 Lindsey Bautista has a history of CAD, emphysema, Barrett's esophagus, depression, hyperlipidemia, anxiety, conversion disorder with mixed symptoms, FM, h/o CVA, COPD, alpha gal.   She has some right sided weakness since her strokes.   She has constant neck pain with bilateral arm pain to both hands x 1 year. She has swelling in her right hand every morning x 6 months. She has weakness, numbness, tingling in both arms. She has issues with hand dexterity and drops things all the time. Feels like her balance is off. She has fallen a few times.   She has constant LBP with bilateral leg pain (entire leg) to her feet, left leg pain > right leg pain. She has weakness and tingling in her legs. She has trouble getting up from squatting position.   No specific alleviating or aggravating factors for her neck or back.   She is taking zanaflex  with some relief.   She smokes 1/2 PPD x more than 40 years.  Bowel/Bladder Dysfunction: none  Conservative measures:  Physical therapy:  has not participated in PT Multimodal medical therapy including regular antiinflammatories: Tizanidine   Injections: no epidural steroid injections  Past Surgery: none  ARSHA OSTERMEIER has symptoms of cervical myelopathy.  The symptoms are causing a significant impact on the patient's life.   Review of Systems:  A 10 point review of systems is negative, except for the pertinent positives and negatives detailed in the HPI.  Past Medical History: Past Medical History:  Diagnosis Date   Arthritis    Atypical chest pain    a. 08/2016 Neg MV.   COPD (chronic obstructive pulmonary disease) (HCC)    Coronary artery disease, non-occlusive    a.  LHC 06/19/2018: Mid LAD 50% with iFR 0.94   Depression    Diastolic dysfunction    a. 10/2016 Echo: EF 60-65%,  no rwma, Gr1 DD, mild MR, nl LA size, nl RV fxn; b. 05/2018 Echo: EF 60-65%. No rwma. Mild MR. Nl RV fxn.   Fibromyalgia    GERD (gastroesophageal reflux disease)    Hypotension    IBS (irritable bowel syndrome)    Right arm weakness    Situational syncope    Stroke (HCC)    2013, 2018.  Some short term memory issues, some weakness   TIA (transient ischemic attack)    Wears dentures    full upper    Past Surgical History: Past Surgical History:  Procedure Laterality Date   CATARACT EXTRACTION W/PHACO Right 05/04/2022   Procedure: CATARACT EXTRACTION PHACO AND INTRAOCULAR LENS PLACEMENT (IOC) RIGHT 3.89 00:47.4;  Surgeon: Trudi Fus, MD;  Location: [facility];  Service: Ophthalmology;  Laterality: Right;   CATARACT EXTRACTION W/PHACO Left 05/18/2022   Procedure: CATARACT EXTRACTION PHACO AND INTRAOCULAR LENS PLACEMENT (IOC) LEFT 1.44 00:19.7;  Surgeon: Trudi Fus, MD;  Location: [facility];  Service: Ophthalmology;  Laterality: Left;   COLONOSCOPY WITH PROPOFOL  N/A 11/22/2015   Procedure: COLONOSCOPY WITH PROPOFOL ;  Surgeon: Cassie Click, MD;  Location: [facility] ENDOSCOPY;  Service: Endoscopy;  Laterality: N/A;   CORONARY PRESSURE/FFR STUDY N/A 06/19/2018   Procedure: INTRAVASCULAR PRESSURE WIRE/FFR STUDY;  Surgeon: Sammy Crisp, MD;  Location: [facility] INVASIVE CV LAB;  Service: Cardiovascular;  Laterality: N/A;   ESOPHAGOGASTRODUODENOSCOPY (EGD) WITH PROPOFOL  N/A 11/22/2015   Procedure: ESOPHAGOGASTRODUODENOSCOPY (EGD)  WITH PROPOFOL ;  Surgeon: Cassie Click, MD;  Location: [facility] ENDOSCOPY;  Service: Endoscopy;  Laterality: N/A;   LEFT HEART CATH AND CORONARY ANGIOGRAPHY N/A 06/19/2018   Procedure: LEFT HEART CATH AND CORONARY ANGIOGRAPHY;  Surgeon: Sammy Crisp, MD;  Location: [facility] INVASIVE CV LAB;  Service: Cardiovascular;  Laterality: N/A;   OOPHORECTOMY     TONSILLECTOMY     VAGINAL HYSTERECTOMY      Allergies: Allergies as of  12/20/2023 - Review Complete 12/20/2023  Allergen Reaction Noted   Amoxicillin Anaphylaxis 11/01/2022   Flagyl  [metronidazole ] Anaphylaxis 05/09/2019   Penicillin g Anaphylaxis and Other (See Comments) 12/17/2014   Iodinated contrast media Rash and Nausea And Vomiting 04/28/2014   Alpha-gal  02/21/2022   Flomax  [tamsulosin  hcl] Hives 03/12/2019    Medications: Outpatient Encounter Medications as of 12/20/2023  Medication Sig   atorvastatin  (LIPITOR) 80 MG tablet Take 1 tablet (80 mg total) by mouth daily.   clonazePAM  (KLONOPIN ) 1 MG tablet Take 1 tablet (1 mg total) by mouth 2 (two) times daily.   dimenhyDRINATE (DRAMAMINE) 50 MG tablet Take 50 mg by mouth every 8 (eight) hours as needed.   docusate sodium  (COLACE) 100 MG capsule Take 100 mg by mouth daily.    EPINEPHRINE  0.3 mg/0.3 mL IJ SOAJ injection INJECT CONTENTS OF 1 PEN INTO THE MUSCLE AS NEEDED FOR ALLERGIC REACTION   ezetimibe  (ZETIA ) 10 MG tablet Take 1 tablet (10 mg total) by mouth daily.   FLUoxetine  (PROZAC ) 40 MG capsule Take 1 capsule (40 mg total) by mouth daily.   meclizine  (ANTIVERT ) 25 MG tablet Take 1 tablet (25 mg total) by mouth 3 (three) times daily as needed.   nitroGLYCERIN  (NITROSTAT ) 0.4 MG SL tablet DISSOLVE ONE TABLET UNDER THE TONGUE EVERY 5 MINUTES AS NEEDED FOR CHEST PAIN.  DO NOT EXCEED A TOTAL OF 3 DOSES IN 15 MINUTES   tiZANidine  (ZANAFLEX ) 2 MG tablet Take 1 tablet (2 mg total) by mouth every 8 (eight) hours as needed for muscle spasms.   No facility-administered encounter medications on file as of 12/20/2023.    Social History: Social History   Tobacco Use   Smoking status: Every Day    Current packs/day: 0.50    Average packs/day: 0.5 packs/day for 46.3 years (23.2 ttl pk-yrs)    Types: Cigarettes    Start date: 1979   Smokeless tobacco: Never   Tobacco comments:    Started smoking age 63    Smoking 1/2 PPD; 05/22/2023.  Vaping Use   Vaping status: Never Used  Substance Use Topics    Alcohol use: No    Alcohol/week: 0.0 standard drinks of alcohol   Drug use: No    Family Medical History: Family History  Problem Relation Age of Onset   Bladder Cancer Mother 38   Heart failure Mother    Renal Disease Mother    COPD Father    Bladder Cancer Maternal Aunt    Bladder Cancer Maternal Aunt    Breast cancer Maternal Aunt    Breast cancer Paternal Aunt 84   Colon cancer Maternal Grandmother    Cancer Paternal Grandfather        unk primary, metastatic, d 79s   Heart disease Other    COPD Other    Stroke Other    Prostate cancer Cousin        metastatic   Prostate cancer Cousin     Physical Examination: Vitals:   12/20/23 1312  BP: 124/62    General:  Patient is well developed, well nourished, calm, collected, and in no apparent distress. Attention to examination is appropriate.  Respiratory: Patient is breathing without any difficulty.   NEUROLOGICAL:     Awake, alert, oriented to person, place, and time.  Speech is clear and fluent. Fund of knowledge is appropriate.   Cranial Nerves: Pupils equal round and reactive to light.  Facial tone is symmetric.    No posterior cervical tenderness. Mild tenderness in bilateral trapezial region.   Diffuse lower posterior lumbar tenderness.   No abnormal lesions on exposed skin.   Strength: Side Biceps Triceps Deltoid Interossei Grip Wrist Ext. Wrist Flex.  R 5 5 5 5 5 5 5   L 5 5 5 5 5 5 5    Side Iliopsoas Quads Hamstring PF DF EHL  R 5 5 5 5 5 5   L 5 5 5 5 5 5    No weakness with above, has pain with strength testing of left leg.   Reflexes are 3+ and symmetric at the biceps, brachioradialis, patella and achilles.   Hoffman's is absent.  Clonus is not present.   Bilateral upper and lower extremity sensation is intact to light touch.     No pain with IR/ER of both hips.   She has pain with ROM of left knee and joint line tenderness.   Gait is slow and unsteady.   Medical Decision  Making  Imaging: No cervical or lumbar imaging.   Assessment and Plan: Ms. Sybert has 1 year history of constant neck pain with bilateral arm pain to both hand. She has weakness, numbness, tingling in both arms. She has issues with hand dexterity and drops things all the time. Feels like her balance is off. She has fallen a few times.   No recent cervical imaging. She is hyper reflexic, has unsteady gait, and dexterity issues. This is all suspicious for cervical stenosis.   She also has constant LBP with bilateral leg pain (entire leg) to her feet, left leg pain > right leg pain. She has weakness and tingling in her legs.   No lumbar imaging.   Also with left knee pain that appears more knee mediated. LBP and bilateral leg pain is likely lumbar mediated.   Treatment options discussed with patient and following plan made:   - Recommend MRI of cervical spine to evaluate for cervical stenosis.  - Also recommend lumbar MRI to evaluate lumbar radiculopathy.  - She has intraocular lens implant x 2 and thinks she cannot have MRI.  - Will contact [facility] (Dr. Meg Spina) to see if she is able to have MRI.  - Will call and let her know once I hear back from Dr. Donalda Fruit.  - If she cannot have MRI scans, may need to consider CT myelogram however she is allergic to contrast. Would need to discuss with radiology.  - Discussed referral to ortho for left knee pain- she declines for now.   I spent a total of 40 minutes in face-to-face and non-face-to-face activities related to this patient's care today including review of outside records, review of imaging, review of symptoms, physical exam, discussion of differential diagnosis, discussion of treatment options, and documentation.   Thank you for involving me in the care of this patient.   ADDENDUM 12/21/23:  Spoke with staff at [facility] with Dr. Meg Spina. Okay to have MRI with intraocular lens implant. Will put in orders for MRI of  cervical and lumbar spine and advise patient.  Lucetta Russel PA-C Dept. of Neurosurgery

## 2023-12-20 ENCOUNTER — Encounter: Payer: Self-pay | Admitting: Orthopedic Surgery

## 2023-12-20 ENCOUNTER — Ambulatory Visit: Admitting: Orthopedic Surgery

## 2023-12-20 VITALS — BP 124/62 | Ht 61.0 in | Wt 146.0 lb

## 2023-12-20 DIAGNOSIS — M542 Cervicalgia: Secondary | ICD-10-CM

## 2023-12-20 DIAGNOSIS — R292 Abnormal reflex: Secondary | ICD-10-CM | POA: Diagnosis not present

## 2023-12-20 DIAGNOSIS — M5416 Radiculopathy, lumbar region: Secondary | ICD-10-CM

## 2023-12-20 DIAGNOSIS — G8929 Other chronic pain: Secondary | ICD-10-CM

## 2023-12-20 DIAGNOSIS — M5442 Lumbago with sciatica, left side: Secondary | ICD-10-CM

## 2023-12-20 DIAGNOSIS — M5412 Radiculopathy, cervical region: Secondary | ICD-10-CM

## 2023-12-20 DIAGNOSIS — M5441 Lumbago with sciatica, right side: Secondary | ICD-10-CM | POA: Diagnosis not present

## 2023-12-20 NOTE — Patient Instructions (Signed)
 It was so nice to see you today. Thank you so much for coming in.    I want to get an MRI of your neck and lower back. I will call Dr. Donalda Fruit to make sure you can get an MRI with your lens implants. We will call and let you know- give me a few days to figure this out.   I think your left knee pain is from your knee. Can consider referral to ortho. Let me know if you want to do this.   I have done a referral to vascular Environmental manager). They should call you. See contact information below.   Please do not hesitate to call if you have any questions or concerns. You can also message me in MyChart.   Lucetta Russel PA-C [phone]     The physicians and staff at [facility] Neurosurgery at [facility] are committed to providing excellent care. You may receive a survey asking for feedback about your experience at our office. We value you your feedback and appreciate you taking the time to to fill it out. The [facility] leadership team is also available to discuss your experience in person, feel free to contact us  [phone].

## 2023-12-21 ENCOUNTER — Telehealth: Payer: Self-pay | Admitting: Orthopedic Surgery

## 2023-12-21 NOTE — Addendum Note (Signed)
 Addended byLucetta Russel on: 12/21/2023 10:55 AM   Modules accepted: Orders

## 2023-12-21 NOTE — Telephone Encounter (Signed)
 Please call and let her know that I called [facility] and was told that she is able to have MRI scans with her lens implants.   I put in orders for cervical and lumbar MRI scans. They will call her to schedule these and we will schedule follow up once I get the results back.   Thanks!

## 2023-12-21 NOTE — Telephone Encounter (Signed)
 Spoke with the patient and told her about the MRIs. She verbalized understanding.

## 2023-12-21 NOTE — Telephone Encounter (Signed)
 I have attempted to call the patient 2 times and the phone just rings and I'm unable to leave voicemail. I will try again later

## 2023-12-26 ENCOUNTER — Ambulatory Visit
Admission: RE | Admit: 2023-12-26 | Discharge: 2023-12-26 | Disposition: A | Discharge status: Home / Self Care | Source: Ambulatory Visit | Attending: Orthopedic Surgery | Admitting: Orthopedic Surgery

## 2023-12-26 DIAGNOSIS — M5416 Radiculopathy, lumbar region: Secondary | ICD-10-CM | POA: Insufficient documentation

## 2023-12-26 DIAGNOSIS — M5441 Lumbago with sciatica, right side: Secondary | ICD-10-CM | POA: Diagnosis not present

## 2023-12-26 DIAGNOSIS — M48061 Spinal stenosis, lumbar region without neurogenic claudication: Secondary | ICD-10-CM | POA: Diagnosis not present

## 2023-12-26 DIAGNOSIS — M50222 Other cervical disc displacement at C5-C6 level: Secondary | ICD-10-CM | POA: Diagnosis not present

## 2023-12-26 DIAGNOSIS — M542 Cervicalgia: Secondary | ICD-10-CM | POA: Diagnosis not present

## 2023-12-26 DIAGNOSIS — G8929 Other chronic pain: Secondary | ICD-10-CM | POA: Insufficient documentation

## 2023-12-26 DIAGNOSIS — M4802 Spinal stenosis, cervical region: Secondary | ICD-10-CM | POA: Diagnosis not present

## 2023-12-26 DIAGNOSIS — R292 Abnormal reflex: Secondary | ICD-10-CM

## 2023-12-26 DIAGNOSIS — M5442 Lumbago with sciatica, left side: Secondary | ICD-10-CM | POA: Diagnosis not present

## 2023-12-26 DIAGNOSIS — M5412 Radiculopathy, cervical region: Secondary | ICD-10-CM | POA: Insufficient documentation

## 2023-12-26 DIAGNOSIS — M50221 Other cervical disc displacement at C4-C5 level: Secondary | ICD-10-CM | POA: Diagnosis not present

## 2023-12-26 DIAGNOSIS — M7989 Other specified soft tissue disorders: Secondary | ICD-10-CM | POA: Diagnosis not present

## 2023-12-26 DIAGNOSIS — R531 Weakness: Secondary | ICD-10-CM | POA: Diagnosis not present

## 2023-12-26 DIAGNOSIS — M5030 Other cervical disc degeneration, unspecified cervical region: Secondary | ICD-10-CM | POA: Diagnosis not present

## 2024-01-01 ENCOUNTER — Telehealth (INDEPENDENT_AMBULATORY_CARE_PROVIDER_SITE_OTHER): Payer: Self-pay

## 2024-01-01 NOTE — Telephone Encounter (Signed)
 Patient called stating that she had left a message while we were closed for lunch and said that she hadn't heard from anyone so she called back. She would like to know if we have a sooner appt (before Monday) because she can't take the pain anymore. States that she can't walk or lay down. I advised that we do not have any available appts before Monday and advised that if she is in that much pain she would need to go to the ED as we wouldn't be able to alleviate the pain on the day of the appt anyway. Patient acknowledged.

## 2024-01-03 NOTE — Telephone Encounter (Signed)
 Error

## 2024-01-07 ENCOUNTER — Encounter (INDEPENDENT_AMBULATORY_CARE_PROVIDER_SITE_OTHER): Payer: Self-pay | Admitting: Vascular Surgery

## 2024-01-07 ENCOUNTER — Ambulatory Visit (INDEPENDENT_AMBULATORY_CARE_PROVIDER_SITE_OTHER): Payer: Self-pay | Admitting: Vascular Surgery

## 2024-01-07 VITALS — BP 148/78 | HR 62 | Resp 18 | Ht 61.0 in | Wt 148.0 lb

## 2024-01-07 DIAGNOSIS — E782 Mixed hyperlipidemia: Secondary | ICD-10-CM

## 2024-01-07 DIAGNOSIS — I2510 Atherosclerotic heart disease of native coronary artery without angina pectoris: Secondary | ICD-10-CM | POA: Diagnosis not present

## 2024-01-07 DIAGNOSIS — I728 Aneurysm of other specified arteries: Secondary | ICD-10-CM | POA: Diagnosis not present

## 2024-01-07 DIAGNOSIS — J432 Centrilobular emphysema: Secondary | ICD-10-CM

## 2024-01-07 DIAGNOSIS — M79604 Pain in right leg: Secondary | ICD-10-CM

## 2024-01-07 DIAGNOSIS — M79605 Pain in left leg: Secondary | ICD-10-CM

## 2024-01-09 NOTE — Progress Notes (Signed)
 Telephone Visit- Progress Note: Referring Physician:  Lemar Pyles, NP [address] [place],  [place] [address]  Primary Physician:  Lemar Pyles, NP  This visit was performed via telephone.  Patient location: home Provider location: office  I spent a total of 12 minutes non-face-to-face activities for this visit on the date of this encounter including review of current clinical condition and response to treatment.    Patient has given verbal consent to this telephone visits and we reviewed the limitations of a telephone visit. Patient wishes to proceed.    Chief Complaint:  review imaging  History of Present Illness: Lindsey Bautista is a 63 y.o. female has a history of CAD, emphysema, Barrett's esophagus, depression, hyperlipidemia, anxiety, conversion disorder with mixed symptoms, FM, h/o CVA, COPD, alpha gal.    She has some right sided weakness since her strokes.   Last seen by me on 12/20/23 for neck and LBP. No recent cervical imaging. She was hyper reflexic, had unsteady gait, and dexterity issues at her last visit. No lumbar imaging.   Cervical and lumbar MRI ordered. Phone visit scheduled to review them.   No significant change to her symptoms.    She continues constant neck pain with bilateral arm pain to both hands. She has weakness, numbness, tingling in both arms. She has issues with hand dexterity and drops things. She feels like her balance is off. She has fallen a few times.    She has constant LBP with bilateral leg pain (entire leg) to her feet, left leg pain = right leg pain. LBP = leg pain. She has weakness and tingling in her legs.    No specific alleviating or aggravating factors for her neck or back.    She is taking zanaflex  with some relief.    She smokes 1/2 PPD x more than 40 years.   Bowel/Bladder Dysfunction: none   Conservative measures:  Physical therapy:  has not participated in PT Multimodal medical therapy including regular  antiinflammatories: Tizanidine   Injections: no epidural steroid injections   Past Surgery: none   [name] has symptoms of cervical myelopathy.   The symptoms are causing a significant impact on the patient's life.    Exam: No exam done as this was a telephone encounter.     Imaging: Cervical MRI dated 12/26/23:  FINDINGS: Alignment: Stable straightening of cervical lordosis. No significant scoliosis or spondylolisthesis.   Vertebrae: Normal background bone marrow signal. Maintained vertebral height. No marrow edema or evidence of acute osseous abnormality.   Cord: Up to mild degenerative cervical spinal cord mass effect (to tail below) with no associated cord signal abnormality. Maintained cord volume.   Negative visible upper thoracic canal and cord.   Posterior Fossa, vertebral arteries, paraspinal tissues: . preserved major vascular flow voids in the bilateral neck, although the right vertebral artery appears congenitally dominant and the left diminutive (no change from 2015). Negative visible bilateral neck soft tissues and lung apices.   Disc levels:   C2-C3:  Negative.   C3-C4: Small central disc bulge or protrusion (series 18, image 17). Mild facet hypertrophy. Mild foraminal disc osteophyte complex on the left. Mild spinal stenosis with subtle ventral cord mass effect. Mild left C4 foraminal stenosis.   C4-C5: Mild disc bulging and endplate spurring. Mild facet hypertrophy. No spinal stenosis. Mild right C5 foraminal stenosis.   C5-C6: Mild circumferential disc bulge with a more broad-based posterior component of disc (series 9, image 27 and series  8, image 27). Mild facet and ligament flavum hypertrophy. Mild spinal stenosis. No convincing cord mass effect. Mild to moderate bilateral C6 foraminal stenosis.   C6-C7: Minimal disc bulging and endplate spurring. Mild facet hypertrophy. No stenosis.   C7-T1: Negative disc. Mild to moderate facet  hypertrophy greater on the left. Mild to moderate left C8 neural foraminal stenosis.   IMPRESSION: 1. No acute osseous abnormality and generally mild for age cervical spine degeneration. 2. Borderline to mild spinal stenosis at C3-C4, greater than C5-C6, primarily due to disc degeneration. Up to mild ventral spinal cord mass effect at the former, no cord signal abnormality. 3. Up to moderate degenerative neural foraminal stenosis at the bilateral C6 and left C8 nerve levels. Mild foraminal stenosis otherwise.     Electronically Signed   By: Marlise Simpers M.D.   On: 01/09/2024 10:40   Lumbar MRI dated 12/26/23:  FINDINGS: Segmentation:  Normal on the comparison CT.   Alignment: Normal lordosis. No significant scoliosis or spondylolisthesis.   Vertebrae: Maintained vertebral body height. Normal background bone marrow signal. L5 posterior vertebral body benign hemangioma (normal variant). Intact visible sacrum and SI joints. No marrow edema or evidence of acute osseous abnormality.   Conus medullaris and cauda equina: Conus extends to the L1-L2 level. No lower spinal cord or conus signal abnormality. Cauda equina nerve roots are within normal limits.   Paraspinal and other soft tissues: Aortic atherosclerosis better demonstrated by CT. Otherwise negative visualized abdominal viscera and paraspinal soft tissues.   Disc levels:   T11-T12: Minor posterior disc bulging, facet hypertrophy. No stenosis.   T12-L1:  Negative.   L1-L2:  Negative.   L2-L3: Subtle disc desiccation. Mild circumferential disc bulge (series 8, image 14). Mild facet hypertrophy greater on the right. No spinal or lateral recess stenosis. Borderline to mild bilateral L2 foraminal stenosis.   L3-L4: Similar mild disc desiccation, circumferential disc bulge. Mild to moderate facet and ligament flavum hypertrophy. No spinal stenosis. Borderline to mild right lateral recess stenosis (right L4 nerve level  series 8, image 19). Mild to moderate bilateral L3 neural foraminal stenosis, fairly symmetric.   L4-L5: Better maintained disc signal and height. Minor mostly far lateral disc bulging. Mild to moderate facet hypertrophy. No spinal or lateral recess stenosis. Mild to moderate left, mild right L4 foraminal stenosis.   L5-S1:  Negative.   IMPRESSION: 1. Generally mild for age lumbar spine degeneration with no significant spinal stenosis. 2. Disc and posterior element degeneration combine for mild to moderate neural foraminal stenosis at the bilateral L3 and left > right L4 nerve levels.     Electronically Signed   By: Marlise Simpers M.D.   On: 01/09/2024 10:35  I have personally reviewed the images and agree with the above interpretation.  Assessment and Plan: Ms. Penson continues constant neck pain with bilateral arm pain to both hands. She has weakness, numbness, tingling in both arms. She has issues with hand dexterity and drops things. She feels like her balance is off. She has fallen a few times.   She has cervical spondylosis with mild central stenosis C3-C4 and C5-C6 along with multilevel foraminal stenosis.   Mild stenosis would not explain her being hyper reflexic or having unsteady gait and dexterity issues. Foraminal stenosis could be contributing to her bilateral arm ain.    She also has constant LBP with bilateral leg pain (entire leg) to her feet, left leg pain = right leg pain. LBP = leg pain. She has weakness  and tingling in her legs.    She has lumbar spondylosis with multilevel level foraminal stenosis. Also with right lateral recess stenosis L3-L4.   LBP likely due to spondylosis. Leg pain may be due to foraminal stenosis.    Treatment options discussed with patient and following plan made:    - Recommend MRI of thoracic spine to further evaluate hyper reflexia, unsteady gait, and balance issues.  - EMG/NCS of bilateral upper extremities to further evaluate bilateral  arm pain/numbness/tingling. She requests this to be done in [place]. Orders to [facility] neurology.  - Discussed PT for lumbar spine and possible injections. She declines for now. Can revisit at her follow up.  - Follow up with me for clinic visit to review her thoracic MRI results once I have them back.   Lucetta Russel PA-C Neurosurgery

## 2024-01-11 ENCOUNTER — Telehealth: Payer: Self-pay | Admitting: Orthopedic Surgery

## 2024-01-11 ENCOUNTER — Encounter: Payer: Self-pay | Admitting: Orthopedic Surgery

## 2024-01-11 ENCOUNTER — Ambulatory Visit (INDEPENDENT_AMBULATORY_CARE_PROVIDER_SITE_OTHER): Admitting: Orthopedic Surgery

## 2024-01-11 DIAGNOSIS — R202 Paresthesia of skin: Secondary | ICD-10-CM

## 2024-01-11 DIAGNOSIS — M4802 Spinal stenosis, cervical region: Secondary | ICD-10-CM | POA: Diagnosis not present

## 2024-01-11 DIAGNOSIS — M4722 Other spondylosis with radiculopathy, cervical region: Secondary | ICD-10-CM

## 2024-01-11 DIAGNOSIS — M5416 Radiculopathy, lumbar region: Secondary | ICD-10-CM

## 2024-01-11 DIAGNOSIS — R292 Abnormal reflex: Secondary | ICD-10-CM | POA: Diagnosis not present

## 2024-01-11 DIAGNOSIS — M47812 Spondylosis without myelopathy or radiculopathy, cervical region: Secondary | ICD-10-CM

## 2024-01-11 DIAGNOSIS — M4726 Other spondylosis with radiculopathy, lumbar region: Secondary | ICD-10-CM

## 2024-01-11 DIAGNOSIS — M5412 Radiculopathy, cervical region: Secondary | ICD-10-CM

## 2024-01-11 DIAGNOSIS — R200 Anesthesia of skin: Secondary | ICD-10-CM | POA: Diagnosis not present

## 2024-01-11 DIAGNOSIS — R2681 Unsteadiness on feet: Secondary | ICD-10-CM | POA: Diagnosis not present

## 2024-01-11 DIAGNOSIS — M47816 Spondylosis without myelopathy or radiculopathy, lumbar region: Secondary | ICD-10-CM

## 2024-01-11 NOTE — Telephone Encounter (Signed)
 Printed out referral just need Stacy's signature.

## 2024-01-11 NOTE — Telephone Encounter (Signed)
 I put in order for EMG of bilateral upper extremities to be done at [facility] by Dr. Mason Sole.   Thanks!

## 2024-01-15 NOTE — Telephone Encounter (Signed)
Referral faxed to [facility]. 

## 2024-01-16 ENCOUNTER — Ambulatory Visit
Admission: RE | Admit: 2024-01-16 | Discharge: 2024-01-16 | Disposition: A | Discharge status: Home / Self Care | Source: Ambulatory Visit | Attending: Orthopedic Surgery | Admitting: Orthopedic Surgery

## 2024-01-16 DIAGNOSIS — R2681 Unsteadiness on feet: Secondary | ICD-10-CM | POA: Insufficient documentation

## 2024-01-16 DIAGNOSIS — D1809 Hemangioma of other sites: Secondary | ICD-10-CM | POA: Diagnosis not present

## 2024-01-16 DIAGNOSIS — R292 Abnormal reflex: Secondary | ICD-10-CM | POA: Diagnosis not present

## 2024-01-16 DIAGNOSIS — M419 Scoliosis, unspecified: Secondary | ICD-10-CM | POA: Diagnosis not present

## 2024-01-16 DIAGNOSIS — M549 Dorsalgia, unspecified: Secondary | ICD-10-CM | POA: Diagnosis not present

## 2024-01-16 DIAGNOSIS — M47814 Spondylosis without myelopathy or radiculopathy, thoracic region: Secondary | ICD-10-CM | POA: Diagnosis not present

## 2024-01-16 NOTE — Telephone Encounter (Signed)
 FYI, we got a message from [facility] that they called the patient to schedule EMG and she told them she wanted to wait on the EMG until she gets her MRI results.

## 2024-01-18 ENCOUNTER — Encounter (INDEPENDENT_AMBULATORY_CARE_PROVIDER_SITE_OTHER): Payer: Self-pay | Admitting: Vascular Surgery

## 2024-01-18 DIAGNOSIS — M79606 Pain in leg, unspecified: Secondary | ICD-10-CM | POA: Insufficient documentation

## 2024-01-18 NOTE — Progress Notes (Signed)
 MRN : [id number]  Lindsey Bautista is a 63 y.o. (12/17/1960) female who presents with chief complaint of check circulation.  History of Present Illness:  The patient presents to the office for evaluation of an splenic artery aneurysm. The aneurysm was found incidentally by CT scan performed for lung cancer screening.  2 peripherally located very calcified splenic artery aneurysms are identified they are small.  The patient denies abdominal pain or unusual back pain, no other abdominal complaints.  Comparison to a CT dated November 01, 2022 there has been no change.  Also there is a study from March 09 2024 years ago which is reviewed by me as well and the calcified splenic artery aneurysms are present and the same size.  All 3 of these studies are reviewed by me personally  No family history of AAA.   The patient returns to the office for followup of atherosclerotic changes of the lower extremities and review of the noninvasive studies.   The patient is also noting that there has been a significant deterioration in the lower extremity pain symptoms.  The patient notes interval shortening of their claudication distance and development of mild rest pain symptoms. No new ulcers or wounds have occurred since the last visit.  There have been no significant changes to the patient's overall health care.  Patient denies amaurosis fugax or TIA symptoms. symptoms of the lower extremities.  The patient denies angina or shortness of breath.    Current Meds  Medication Sig   atorvastatin  (LIPITOR) 80 MG tablet Take 1 tablet (80 mg total) by mouth daily.   clonazePAM  (KLONOPIN ) 1 MG tablet Take 1 tablet (1 mg total) by mouth 2 (two) times daily.   dimenhyDRINATE (DRAMAMINE) 50 MG tablet Take 50 mg by mouth every 8 (eight) hours as needed.   docusate sodium  (COLACE) 100 MG capsule Take 100 mg by mouth daily.    EPINEPHRINE  0.3 mg/0.3 mL IJ  SOAJ injection INJECT CONTENTS OF 1 PEN INTO THE MUSCLE AS NEEDED FOR ALLERGIC REACTION   ezetimibe  (ZETIA ) 10 MG tablet Take 1 tablet (10 mg total) by mouth daily.   FLUoxetine  (PROZAC ) 40 MG capsule Take 1 capsule (40 mg total) by mouth daily.   meclizine  (ANTIVERT ) 25 MG tablet Take 1 tablet (25 mg total) by mouth 3 (three) times daily as needed.   nitroGLYCERIN  (NITROSTAT ) 0.4 MG SL tablet DISSOLVE ONE TABLET UNDER THE TONGUE EVERY 5 MINUTES AS NEEDED FOR CHEST PAIN.  DO NOT EXCEED A TOTAL OF 3 DOSES IN 15 MINUTES   tiZANidine  (ZANAFLEX ) 2 MG tablet Take 1 tablet (2 mg total) by mouth every 8 (eight) hours as needed for muscle spasms.    Past Medical History:  Diagnosis Date   Arthritis    Atypical chest pain    a. 08/2016 Neg MV.   COPD (chronic obstructive pulmonary disease) (HCC)    Coronary artery disease, non-occlusive    a.  LHC 06/19/2018: Mid LAD 50% with iFR 0.94   Depression    Diastolic dysfunction    a. 10/2016 Echo: EF 60-65%, no rwma, Gr1  DD, mild MR, nl LA size, nl RV fxn; b. 05/2018 Echo: EF 60-65%. No rwma. Mild MR. Nl RV fxn.   Fibromyalgia    GERD (gastroesophageal reflux disease)    Hypotension    IBS (irritable bowel syndrome)    Right arm weakness    Situational syncope    Stroke (HCC)    2013, 2018.  Some short term memory issues, some weakness   TIA (transient ischemic attack)    Wears dentures    full upper    Past Surgical History:  Procedure Laterality Date   CATARACT EXTRACTION W/PHACO Right 05/04/2022   Procedure: CATARACT EXTRACTION PHACO AND INTRAOCULAR LENS PLACEMENT (IOC) RIGHT 3.89 00:47.4;  Surgeon: Trudi Fus, MD;  Location: [facility];  Service: Ophthalmology;  Laterality: Right;   CATARACT EXTRACTION W/PHACO Left 05/18/2022   Procedure: CATARACT EXTRACTION PHACO AND INTRAOCULAR LENS PLACEMENT (IOC) LEFT 1.44 00:19.7;  Surgeon: Trudi Fus, MD;  Location: [facility];  Service: Ophthalmology;   Laterality: Left;   COLONOSCOPY WITH PROPOFOL  N/A 11/22/2015   Procedure: COLONOSCOPY WITH PROPOFOL ;  Surgeon: Cassie Click, MD;  Location: [facility];  Service: Endoscopy;  Laterality: N/A;   CORONARY PRESSURE/FFR STUDY N/A 06/19/2018   Procedure: INTRAVASCULAR PRESSURE WIRE/FFR STUDY;  Surgeon: Sammy Crisp, MD;  Location: [facility];  Service: Cardiovascular;  Laterality: N/A;   ESOPHAGOGASTRODUODENOSCOPY (EGD) WITH PROPOFOL  N/A 11/22/2015   Procedure: ESOPHAGOGASTRODUODENOSCOPY (EGD) WITH PROPOFOL ;  Surgeon: Cassie Click, MD;  Location: [facility];  Service: Endoscopy;  Laterality: N/A;   LEFT HEART CATH AND CORONARY ANGIOGRAPHY N/A 06/19/2018   Procedure: LEFT HEART CATH AND CORONARY ANGIOGRAPHY;  Surgeon: Sammy Crisp, MD;  Location: [facility];  Service: Cardiovascular;  Laterality: N/A;   OOPHORECTOMY     TONSILLECTOMY     VAGINAL HYSTERECTOMY      Social History Social History   Tobacco Use   Smoking status: Every Day    Current packs/day: 0.50    Average packs/day: 0.5 packs/day for 46.4 years (23.2 ttl pk-yrs)    Types: Cigarettes    Start date: 1979   Smokeless tobacco: Never   Tobacco comments:    Started smoking age 67    Smoking 1/2 PPD; 05/22/2023.  Vaping Use   Vaping status: Never Used  Substance Use Topics   Alcohol use: No    Alcohol/week: 0.0 standard drinks of alcohol   Drug use: No    Family History Family History  Problem Relation Age of Onset   Bladder Cancer Mother 41   Heart failure Mother    Renal Disease Mother    COPD Father    Bladder Cancer Maternal Aunt    Bladder Cancer Maternal Aunt    Breast cancer Maternal Aunt    Breast cancer Paternal Aunt 78   Colon cancer Maternal Grandmother    Cancer Paternal Grandfather        unk primary, metastatic, d 65s   Heart disease Other    COPD Other    Stroke Other    Prostate cancer Cousin        metastatic   Prostate cancer Cousin     Allergies   Allergen Reactions   Amoxicillin Anaphylaxis    Facial swelling and SOB   Flagyl  [Metronidazole ] Anaphylaxis   Penicillin G Anaphylaxis and Other (See Comments)    Has patient had a PCN reaction causing immediate rash, facial/tongue/throat swelling, SOB or lightheadedness with hypotension: [id number]} Has patient had a PCN  reaction causing severe rash involving mucus membranes or skin necrosis: no:[id number]} Has patient had a PCN reaction that required hospitalization no:[id number]} Has patient had a PCN reaction occurring within the last 10 years: [id number]} If all of the above answers are "NO", then may proceed with Cephalosporin use.    Iodinated Contrast Media Rash and Nausea And Vomiting   Alpha-Gal    Flomax  [Tamsulosin  Hcl] Hives    Per ER visit 03/11/19     REVIEW OF SYSTEMS (Negative unless checked)  Constitutional: [] Weight loss  [] Fever  [] Chills Cardiac: [] Chest pain   [] Chest pressure   [] Palpitations   [] Shortness of breath when laying flat   [] Shortness of breath with exertion. Vascular:  [x] Pain in legs with walking   [] Pain in legs at rest  [] History of DVT   [] Phlebitis   [] Swelling in legs   [] Varicose veins   [] Non-healing ulcers Pulmonary:   [] Uses home oxygen   [] Productive cough   [] Hemoptysis   [] Wheeze  [] COPD   [] Asthma Neurologic:  [] Dizziness   [] Seizures   [] History of stroke   [] History of TIA  [] Aphasia   [] Vissual changes   [] Weakness or numbness in arm   [] Weakness or numbness in leg Musculoskeletal:   [] Joint swelling   [] Joint pain   [] Low back pain Hematologic:  [] Easy bruising  [] Easy bleeding   [] Hypercoagulable state   [] Anemic Gastrointestinal:  [] Diarrhea   [] Vomiting  [] Gastroesophageal reflux/heartburn   [] Difficulty swallowing. Genitourinary:  [] Chronic kidney disease   [] Difficult urination  [] Frequent urination   [] Blood in urine Skin:  [] Rashes   [] Ulcers  Psychological:  [] History of anxiety   []  History of major depression.  Physical  Examination  Vitals:   01/07/24 1415  BP: (!) 148/78  Pulse: 62  Resp: 18  Weight: 148 lb (67.1 kg)  Height: 5\' 1"  (1.549 m)   Body mass index is 27.96 kg/m. Gen: WD/WN, NAD Head: [place]/AT, No temporalis wasting.  Ear/Nose/Throat: Hearing grossly intact, nares w/o erythema or drainage Eyes: PER, EOMI, sclera nonicteric.  Neck: Supple, no masses.  No bruit or JVD.  Pulmonary:  Good air movement, no audible wheezing, no use of accessory muscles.  Cardiac: RRR, normal S1, S2, no Murmurs. Vascular:  mild trophic changes, no open wounds Vessel Right Left  Radial Palpable Palpable  PT Not Palpable Not Palpable  DP Not Palpable Not Palpable  Gastrointestinal: soft, non-distended. No guarding/no peritoneal signs.  Musculoskeletal: M/S 5/5 throughout.  No visible deformity.  Neurologic: CN 2-12 intact. Pain and light touch intact in extremities.  Symmetrical.  Speech is fluent. Motor exam as listed above. Psychiatric: Judgment intact, Mood & affect appropriate for pt's clinical situation. Dermatologic: No rashes or ulcers noted.  No changes consistent with cellulitis.   CBC Lab Results  Component Value Date   WBC 4.9 08/31/2023   HGB 13.2 08/31/2023   HCT 39.9 08/31/2023   MCV 90 08/31/2023   PLT 256 08/31/2023    BMET    Component Value Date/Time   NA 140 08/31/2023 1315   NA 144 04/27/2014 1404   K 4.3 08/31/2023 1315   K 3.8 04/27/2014 1404   CL 101 08/31/2023 1315   CL 106 04/27/2014 1404   CO2 21 08/31/2023 1315   CO2 27 04/27/2014 1404   GLUCOSE 55 (L) 08/31/2023 1315   GLUCOSE 85 07/07/2023 1649   GLUCOSE 85 04/27/2014 1404   BUN 10 08/31/2023 1315   BUN 7 04/27/2014 1404   CREATININE 0.91 08/31/2023  1315   CREATININE 0.81 04/27/2014 1404   CALCIUM  9.4 08/31/2023 1315   CALCIUM  8.9 04/27/2014 1404   GFRNONAA >60 07/07/2023 1649   GFRNONAA >60 04/27/2014 1404   GFRAA 95 09/29/2020 1406   GFRAA >60 04/27/2014 1404   CrCl cannot be calculated (Patient's most  recent lab result is older than the maximum 21 days allowed.).  COAG Lab Results  Component Value Date   INR 0.86 03/12/2017   INR 1.02 12/13/2015   INR 0.9 11/27/2013    Radiology MR CERVICAL SPINE WO CONTRAST Result Date: 01/09/2024 CLINICAL DATA:  63 year old female with right side weakness. Constant neck pain radiating to both upper extremities. Swelling in her right hand. Constant low back pain, bilateral leg pain radiating to the feet left greater than right. Weakness and tingling in the lower extremities. EXAM: MRI CERVICAL SPINE WITHOUT CONTRAST TECHNIQUE: Multiplanar, multisequence MR imaging of the cervical spine was performed. No intravenous contrast was administered. COMPARISON:  Previous cervical spine MRI 11/27/2013. FINDINGS: Alignment: Stable straightening of cervical lordosis. No significant scoliosis or spondylolisthesis. Vertebrae: Normal background bone marrow signal. Maintained vertebral height. No marrow edema or evidence of acute osseous abnormality. Cord: Up to mild degenerative cervical spinal cord mass effect (to tail below) with no associated cord signal abnormality. Maintained cord volume. Negative visible upper thoracic canal and cord. Posterior Fossa, vertebral arteries, paraspinal tissues: . preserved major vascular flow voids in the bilateral neck, although the right vertebral artery appears congenitally dominant and the left diminutive (no change from 2015). Negative visible bilateral neck soft tissues and lung apices. Disc levels: C2-C3:  Negative. C3-C4: Small central disc bulge or protrusion (series 18, image 17). Mild facet hypertrophy. Mild foraminal disc osteophyte complex on the left. Mild spinal stenosis with subtle ventral cord mass effect. Mild left C4 foraminal stenosis. C4-C5: Mild disc bulging and endplate spurring. Mild facet hypertrophy. No spinal stenosis. Mild right C5 foraminal stenosis. C5-C6: Mild circumferential disc bulge with a more broad-based  posterior component of disc (series 9, image 27 and series 8, image 27). Mild facet and ligament flavum hypertrophy. Mild spinal stenosis. No convincing cord mass effect. Mild to moderate bilateral C6 foraminal stenosis. C6-C7: Minimal disc bulging and endplate spurring. Mild facet hypertrophy. No stenosis. C7-T1: Negative disc. Mild to moderate facet hypertrophy greater on the left. Mild to moderate left C8 neural foraminal stenosis. IMPRESSION: 1. No acute osseous abnormality and generally mild for age cervical spine degeneration. 2. Borderline to mild spinal stenosis at C3-C4, greater than C5-C6, primarily due to disc degeneration. Up to mild ventral spinal cord mass effect at the former, no cord signal abnormality. 3. Up to moderate degenerative neural foraminal stenosis at the bilateral C6 and left C8 nerve levels. Mild foraminal stenosis otherwise. Electronically Signed   By: Marlise Simpers M.D.   On: 01/09/2024 10:40   MR LUMBAR SPINE WO CONTRAST Result Date: 01/09/2024 CLINICAL DATA:  63 year old female with right side weakness. Constant neck pain radiating to both upper extremities. Swelling in her right hand. Constant low back pain, bilateral leg pain radiating to the feet left greater than right. Weakness and tingling in the lower extremities. EXAM: MRI LUMBAR SPINE WITHOUT CONTRAST TECHNIQUE: Multiplanar, multisequence MR imaging of the lumbar spine was performed. No intravenous contrast was administered. COMPARISON:  CT Abdomen and Pelvis 11/01/2022. FINDINGS: Segmentation:  Normal on the comparison CT. Alignment: Normal lordosis. No significant scoliosis or spondylolisthesis. Vertebrae: Maintained vertebral body height. Normal background bone marrow signal. L5 posterior vertebral body benign  hemangioma (normal variant). Intact visible sacrum and SI joints. No marrow edema or evidence of acute osseous abnormality. Conus medullaris and cauda equina: Conus extends to the L1-L2 level. No lower spinal cord or  conus signal abnormality. Cauda equina nerve roots are within normal limits. Paraspinal and other soft tissues: Aortic atherosclerosis better demonstrated by CT. Otherwise negative visualized abdominal viscera and paraspinal soft tissues. Disc levels: T11-T12: Minor posterior disc bulging, facet hypertrophy. No stenosis. T12-L1:  Negative. L1-L2:  Negative. L2-L3: Subtle disc desiccation. Mild circumferential disc bulge (series 8, image 14). Mild facet hypertrophy greater on the right. No spinal or lateral recess stenosis. Borderline to mild bilateral L2 foraminal stenosis. L3-L4: Similar mild disc desiccation, circumferential disc bulge. Mild to moderate facet and ligament flavum hypertrophy. No spinal stenosis. Borderline to mild right lateral recess stenosis (right L4 nerve level series 8, image 19). Mild to moderate bilateral L3 neural foraminal stenosis, fairly symmetric. L4-L5: Better maintained disc signal and height. Minor mostly far lateral disc bulging. Mild to moderate facet hypertrophy. No spinal or lateral recess stenosis. Mild to moderate left, mild right L4 foraminal stenosis. L5-S1:  Negative. IMPRESSION: 1. Generally mild for age lumbar spine degeneration with no significant spinal stenosis. 2. Disc and posterior element degeneration combine for mild to moderate neural foraminal stenosis at the bilateral L3 and left > right L4 nerve levels. Electronically Signed   By: Marlise Simpers M.D.   On: 01/09/2024 10:35     Assessment/Plan 1. Aneurysm of splenic artery (HCC) No surgery or intervention at this time.  The patient has an asymptomatic splenic artery aneurysm that is less than 2.5 cm in maximal diameter.  I have discussed the natural history of splenic artery aneurysms and the small risk of rupture for aneurysm less than 2.5 cm in size.  However, as these small aneurysms tend to enlarge over time, continued surveillance with ultrasound or CT scan is mandatory.   The increased risk of rupture  during pregnancy was also discussed.  I have also discussed optimizing medical management of co-morbidities such as  hypertension and lipid control as well as the importance of abstinence from tobacco if the patient is a current smoker.  The patient is also encouraged to exercise a minimum of 30 minutes 4 times a week.   Should the patient develop new onset abdominal or back pain they are instructed to seek medical attention immediately and to alert the physician providing care that they have an aneurysm.   The patient voices their understanding.  The patient will return in 12 months with an mesenteric  duplex.   2. Pain in both lower extremities (Primary) Recommend:  Patient should undergo arterial duplex of the lower extremity because there has been a significant deterioration in the patient's lower extremity symptoms.  The patient states they are having increased pain and a marked decrease in the distance that they can walk.  The risks and benefits as well as the alternatives were discussed in detail with the patient.  All questions were answered.  Patient agrees to proceed and understands this could be a prelude to angiography and intervention.  The patient will follow up with me in the office to review the studies.  - VAS US  AORTA/IVC/ILIACS; Future - VAS US  ABI WITH/WO TBI; Future  3. Coronary artery disease, non-occlusive Continue cardiac and antihypertensive medications as already ordered and reviewed, no changes at this time.  Continue statin as ordered and reviewed, no changes at this time  Nitrates PRN for  chest pain  4. Centrilobular emphysema (HCC) Continue pulmonary medications and aerosols as already ordered, these medications have been reviewed and there are no changes at this time.   5. Mixed hyperlipidemia Continue statin as ordered and reviewed, no changes at this time d   Devon Fogo, MD  01/18/2024 5:19 PM

## 2024-01-28 NOTE — Telephone Encounter (Signed)
 error

## 2024-02-08 NOTE — Progress Notes (Deleted)
 Referring Physician:  Lemar Pyles, NP [address] [place],  [place] [address]  Primary Physician:  Lemar Pyles, NP  History of Present Illness: Lindsey Bautista is a 63 y.o. female has a history of CAD, emphysema, Barrett's esophagus, depression, hyperlipidemia, anxiety, conversion disorder with mixed symptoms, FM, h/o CVA, COPD, alpha gal.    She has some right sided weakness since her strokes.   Did phone visit with me on 01/11/24 for neck and back pain.   She has cervical spondylosis with mild central stenosis C3-C4 and C5-C6 along with multilevel foraminal stenosis.    Mild stenosis would not explain her being hyper reflexic or having unsteady gait and dexterity issues. Foraminal stenosis could be contributing to her bilateral arm pain.    She has lumbar spondylosis with multilevel level foraminal stenosis. Also with right lateral recess stenosis L3-L4.    EMG of upper extremities was ordered at [facility] neurology. She is here to review her thoracic MRI.        Last seen by me on 12/20/23 for neck and LBP. No recent cervical imaging. She was hyper reflexic, had unsteady gait, and dexterity issues at her last visit. No lumbar imaging.    Cervical and lumbar MRI ordered. Phone visit scheduled to review them.    No significant change to her symptoms.    She continues constant neck pain with bilateral arm pain to both hands. She has weakness, numbness, tingling in both arms. She has issues with hand dexterity and drops things. She feels like her balance is off. She has fallen a few times.    She has constant LBP with bilateral leg pain (entire leg) to her feet, left leg pain = right leg pain. LBP = leg pain. She has weakness and tingling in her legs.    No specific alleviating or aggravating factors for her neck or back.    She is taking zanaflex  with some relief.    She smokes 1/2 PPD x more than 40 years.   Bowel/Bladder Dysfunction: none   Conservative measures:   Physical therapy:  has not participated in PT Multimodal medical therapy including regular antiinflammatories: Tizanidine   Injections: no epidural steroid injections   Past Surgery: none   Lindsey Bautista has symptoms of cervical myelopathy.   The symptoms are causing a significant impact on the patient's life.    Review of Systems:  A 10 point review of systems is negative, except for the pertinent positives and negatives detailed in the HPI.  Past Medical History: Past Medical History:  Diagnosis Date   Arthritis    Atypical chest pain    a. 08/2016 Neg MV.   COPD (chronic obstructive pulmonary disease) (HCC)    Coronary artery disease, non-occlusive    a.  LHC 06/19/2018: Mid LAD 50% with iFR 0.94   Depression    Diastolic dysfunction    a. 10/2016 Echo: EF 60-65%, no rwma, Gr1 DD, mild MR, nl LA size, nl RV fxn; b. 05/2018 Echo: EF 60-65%. No rwma. Mild MR. Nl RV fxn.   Fibromyalgia    GERD (gastroesophageal reflux disease)    Hypotension    IBS (irritable bowel syndrome)    Right arm weakness    Situational syncope    Stroke (HCC)    2013, 2018.  Some short term memory issues, some weakness   TIA (transient ischemic attack)    Wears dentures    full upper    Past Surgical History:  Past Surgical History:  Procedure Laterality Date   CATARACT EXTRACTION W/PHACO Right 05/04/2022   Procedure: CATARACT EXTRACTION PHACO AND INTRAOCULAR LENS PLACEMENT (IOC) RIGHT 3.89 00:47.4;  Surgeon: Trudi Fus, MD;  Location: [facility];  Service: Ophthalmology;  Laterality: Right;   CATARACT EXTRACTION W/PHACO Left 05/18/2022   Procedure: CATARACT EXTRACTION PHACO AND INTRAOCULAR LENS PLACEMENT (IOC) LEFT 1.44 00:19.7;  Surgeon: Trudi Fus, MD;  Location: [facility];  Service: Ophthalmology;  Laterality: Left;   COLONOSCOPY WITH PROPOFOL  N/A 11/22/2015   Procedure: COLONOSCOPY WITH PROPOFOL ;  Surgeon: Cassie Click, MD;  Location: [facility]  ENDOSCOPY;  Service: Endoscopy;  Laterality: N/A;   CORONARY PRESSURE/FFR STUDY N/A 06/19/2018   Procedure: INTRAVASCULAR PRESSURE WIRE/FFR STUDY;  Surgeon: Sammy Crisp, MD;  Location: [facility] INVASIVE CV LAB;  Service: Cardiovascular;  Laterality: N/A;   ESOPHAGOGASTRODUODENOSCOPY (EGD) WITH PROPOFOL  N/A 11/22/2015   Procedure: ESOPHAGOGASTRODUODENOSCOPY (EGD) WITH PROPOFOL ;  Surgeon: Cassie Click, MD;  Location: [facility] ENDOSCOPY;  Service: Endoscopy;  Laterality: N/A;   LEFT HEART CATH AND CORONARY ANGIOGRAPHY N/A 06/19/2018   Procedure: LEFT HEART CATH AND CORONARY ANGIOGRAPHY;  Surgeon: Sammy Crisp, MD;  Location: [facility] INVASIVE CV LAB;  Service: Cardiovascular;  Laterality: N/A;   OOPHORECTOMY     TONSILLECTOMY     VAGINAL HYSTERECTOMY      Allergies: Allergies as of 02/19/2024 - Review Complete 01/18/2024  Allergen Reaction Noted   Amoxicillin Anaphylaxis 11/01/2022   Flagyl  [metronidazole ] Anaphylaxis 05/09/2019   Penicillin g Anaphylaxis and Other (See Comments) 12/17/2014   Iodinated contrast media Rash and Nausea And Vomiting 04/28/2014   Alpha-gal  02/21/2022   Flomax  [tamsulosin  hcl] Hives 03/12/2019    Medications: Outpatient Encounter Medications as of 02/19/2024  Medication Sig   atorvastatin  (LIPITOR) 80 MG tablet Take 1 tablet (80 mg total) by mouth daily.   clonazePAM  (KLONOPIN ) 1 MG tablet Take 1 tablet (1 mg total) by mouth 2 (two) times daily.   dimenhyDRINATE (DRAMAMINE) 50 MG tablet Take 50 mg by mouth every 8 (eight) hours as needed.   docusate sodium  (COLACE) 100 MG capsule Take 100 mg by mouth daily.    EPINEPHRINE  0.3 mg/0.3 mL IJ SOAJ injection INJECT CONTENTS OF 1 PEN INTO THE MUSCLE AS NEEDED FOR ALLERGIC REACTION   ezetimibe  (ZETIA ) 10 MG tablet Take 1 tablet (10 mg total) by mouth daily.   FLUoxetine  (PROZAC ) 40 MG capsule Take 1 capsule (40 mg total) by mouth daily.   meclizine  (ANTIVERT ) 25 MG tablet Take 1 tablet (25 mg total) by mouth 3 (three)  times daily as needed.   nitroGLYCERIN  (NITROSTAT ) 0.4 MG SL tablet DISSOLVE ONE TABLET UNDER THE TONGUE EVERY 5 MINUTES AS NEEDED FOR CHEST PAIN.  DO NOT EXCEED A TOTAL OF 3 DOSES IN 15 MINUTES   tiZANidine  (ZANAFLEX ) 2 MG tablet Take 1 tablet (2 mg total) by mouth every 8 (eight) hours as needed for muscle spasms.   No facility-administered encounter medications on file as of 02/19/2024.    Social History: Social History   Tobacco Use   Smoking status: Every Day    Current packs/day: 0.50    Average packs/day: 0.5 packs/day for 46.5 years (23.2 ttl pk-yrs)    Types: Cigarettes    Start date: 1979   Smokeless tobacco: Never   Tobacco comments:    Started smoking age 52    Smoking 1/2 PPD; 05/22/2023.  Vaping Use   Vaping status: Never Used  Substance Use Topics   Alcohol use:  No    Alcohol/week: 0.0 standard drinks of alcohol   Drug use: No    Family Medical History: Family History  Problem Relation Age of Onset   Bladder Cancer Mother 19   Heart failure Mother    Renal Disease Mother    COPD Father    Bladder Cancer Maternal Aunt    Bladder Cancer Maternal Aunt    Breast cancer Maternal Aunt    Breast cancer Paternal Aunt 3   Colon cancer Maternal Grandmother    Cancer Paternal Grandfather        unk primary, metastatic, d 26s   Heart disease Other    COPD Other    Stroke Other    Prostate cancer Cousin        metastatic   Prostate cancer Cousin     Physical Examination: There were no vitals filed for this visit.  Awake, alert, oriented to person, place, and time.  Speech is clear and fluent. Fund of knowledge is appropriate.   Cranial Nerves: Pupils equal round and reactive to light.  Facial tone is symmetric.    No posterior cervical tenderness. Mild tenderness in bilateral trapezial region.   Diffuse lower posterior lumbar tenderness.   No abnormal lesions on exposed skin.   Strength: Side Biceps Triceps Deltoid Interossei Grip Wrist Ext. Wrist Flex.   R 5 5 5 5 5 5 5   L 5 5 5 5 5 5 5    Side Iliopsoas Quads Hamstring PF DF EHL  R 5 5 5 5 5 5   L 5 5 5 5 5 5    No weakness with above, has pain with strength testing of left leg.   Reflexes are 3+ and symmetric at the biceps, brachioradialis, patella and achilles.   Hoffman's is absent.  Clonus is not present.   Bilateral upper and lower extremity sensation is intact to light touch.     No pain with IR/ER of both hips.   She has pain with ROM of left knee and joint line tenderness.   Gait is slow and unsteady.   Medical Decision Making  Imaging: Thoracic MRI dated 01/16/24:  ***  I have personally reviewed the images and agree with the above interpretation.   Assessment and Plan: Lindsey Bautista continues constant neck pain with bilateral arm pain to both hands. She has weakness, numbness, tingling in both arms. She has issues with hand dexterity and drops things. She feels like her balance is off. She has fallen a few times.    She has cervical spondylosis with mild central stenosis C3-C4 and C5-C6 along with multilevel foraminal stenosis.    Mild stenosis would not explain her being hyper reflexic or having unsteady gait and dexterity issues. Foraminal stenosis could be contributing to her bilateral arm ain.    She also has constant LBP with bilateral leg pain (entire leg) to her feet, left leg pain = right leg pain. LBP = leg pain. She has weakness and tingling in her legs.    She has lumbar spondylosis with multilevel level foraminal stenosis. Also with right lateral recess stenosis L3-L4.    LBP likely due to spondylosis. Leg pain may be due to foraminal stenosis.    Treatment options discussed with patient and following plan made:    - Recommend MRI of thoracic spine to further evaluate hyper reflexia, unsteady gait, and balance issues.  - EMG/NCS of bilateral upper extremities to further evaluate bilateral arm pain/numbness/tingling. She requests this to be done in  [place]. Orders to [facility] neurology.  - Discussed PT for lumbar spine and possible injections. She declines for now. Can revisit at her follow up.  - Follow up with me for clinic visit to review her thoracic MRI results once I have them back.   I spent a total of 40 minutes in face-to-face and non-face-to-face activities related to this patient's care today including review of outside records, review of imaging, review of symptoms, physical exam, discussion of differential diagnosis, discussion of treatment options, and documentation.    Lucetta Russel PA-C Dept. of Neurosurgery

## 2024-02-11 ENCOUNTER — Telehealth: Payer: Self-pay | Admitting: Orthopedic Surgery

## 2024-02-11 NOTE — Telephone Encounter (Signed)
 Waiting on Thoracic MRI results. She is scheduled w/ Stacy on 7/1.

## 2024-02-11 NOTE — Telephone Encounter (Signed)
 I called to triage the patient and get more information on the patient's symptoms.   Patient's pain increased on Friday. She states that she is in excruciating pain.  She states that her pain is greater than 10. She can only tolerate laying on her side. Is unable to lay on her back.   If she tries to sit up she feels like her back is crunching in two.    Endorses numbness and tingling in arms and hands. Denies saddle anesthesia  Endorses weakness in lower back and legs   She is taking zanaflex  only. No OTC medications such as tylenol  or ibuprofen .   Endorses urinary frequency at baseline with new bowel frequency. Denies any urinary retention.  Nausea from pain without vomiting.

## 2024-02-11 NOTE — Telephone Encounter (Signed)
 Patient said that she wants to be seen asap. She is having excruciation pain. She has been in bed the past 3 days. When she sits she feels like someone is tearing her back apart. Her MRI cervical and lumbar are in her chart. I will call the reading room for the thoracic results.

## 2024-02-11 NOTE — Telephone Encounter (Signed)
 Patient aware of appointment

## 2024-02-11 NOTE — Telephone Encounter (Signed)
 Patient's MRI report in chart

## 2024-02-12 ENCOUNTER — Encounter: Payer: Self-pay | Admitting: Physician Assistant

## 2024-02-12 ENCOUNTER — Ambulatory Visit (INDEPENDENT_AMBULATORY_CARE_PROVIDER_SITE_OTHER): Admitting: Physician Assistant

## 2024-02-12 VITALS — BP 134/86 | Ht 61.0 in | Wt 148.0 lb

## 2024-02-12 DIAGNOSIS — G8929 Other chronic pain: Secondary | ICD-10-CM

## 2024-02-12 DIAGNOSIS — M5442 Lumbago with sciatica, left side: Secondary | ICD-10-CM

## 2024-02-12 DIAGNOSIS — M5441 Lumbago with sciatica, right side: Secondary | ICD-10-CM | POA: Diagnosis not present

## 2024-02-12 DIAGNOSIS — M62838 Other muscle spasm: Secondary | ICD-10-CM

## 2024-02-12 DIAGNOSIS — R292 Abnormal reflex: Secondary | ICD-10-CM

## 2024-02-12 DIAGNOSIS — R200 Anesthesia of skin: Secondary | ICD-10-CM

## 2024-02-12 DIAGNOSIS — M79604 Pain in right leg: Secondary | ICD-10-CM

## 2024-02-12 MED ORDER — METHYLPREDNISOLONE 4 MG PO TBPK
ORAL_TABLET | ORAL | 0 refills | Status: DC
Start: 2024-02-13 — End: 2024-02-27

## 2024-02-12 MED ORDER — TIZANIDINE HCL 2 MG PO TABS: 2.0000 mg | ORAL_TABLET | Freq: Three times a day (TID) | ORAL | 1 refills | Status: AC | PRN

## 2024-02-12 NOTE — Progress Notes (Unsigned)
 Referring Physician:  Valerio Melanie DASEN, NP [address] [place],  [place] [address]  Primary Physician:  Valerio Melanie DASEN, NP  History of Present Illness: 02/12/2024  Patient coming to clinic today for increased pain x 5 days after removing clothes.  She describes pain severe in her low back and both of her legs.  She feels that she has weakness in her legs at baseline this is continued.  She indicates she is having some trouble bending over or laying on her side.  She recently saw vascular surgery for workup for her leg pain.  No saddle anesthesia or numbness and tingling.   01/11/24 [name] has a history of CAD, emphysema, Barrett's esophagus, depression, hyperlipidemia, anxiety, conversion disorder with mixed symptoms, FM, h/o CVA, COPD, alpha gal.   She has some right sided weakness since her strokes.   She has constant neck pain with bilateral arm pain to both hands x 1 year. She has swelling in her right hand every morning x 6 months. She has weakness, numbness, tingling in both arms. She has issues with hand dexterity and drops things all the time. Feels like her balance is off. She has fallen a few times.   She has constant LBP with bilateral leg pain (entire leg) to her feet, left leg pain > right leg pain. She has weakness and tingling in her legs. She has trouble getting up from squatting position.   No specific alleviating or aggravating factors for her neck or back.   She is taking zanaflex  with some relief.   She smokes 1/2 PPD x more than 40 years.  Bowel/Bladder Dysfunction: none  Conservative measures:  Physical therapy:  has not participated in PT Multimodal medical therapy including regular antiinflammatories: Tizanidine   Injections: no epidural steroid injections  Past Surgery: none  Lindsey Bautista has symptoms of cervical myelopathy.  The symptoms are causing a significant impact on the patient's life.   Review of Systems:  A 10 point review of  systems is negative, except for the pertinent positives and negatives detailed in the HPI.  Past Medical History: Past Medical History:  Diagnosis Date   Arthritis    Atypical chest pain    a. 08/2016 Neg MV.   COPD (chronic obstructive pulmonary disease) (HCC)    Coronary artery disease, non-occlusive    a.  LHC 06/19/2018: Mid LAD 50% with iFR 0.94   Depression    Diastolic dysfunction    a. 10/2016 Echo: EF 60-65%, no rwma, Gr1 DD, mild MR, nl LA size, nl RV fxn; b. 05/2018 Echo: EF 60-65%. No rwma. Mild MR. Nl RV fxn.   Fibromyalgia    GERD (gastroesophageal reflux disease)    Hypotension    IBS (irritable bowel syndrome)    Right arm weakness    Situational syncope    Stroke (HCC)    2013, 2018.  Some short term memory issues, some weakness   TIA (transient ischemic attack)    Wears dentures    full upper    Past Surgical History: Past Surgical History:  Procedure Laterality Date   CATARACT EXTRACTION W/PHACO Right 05/04/2022   Procedure: CATARACT EXTRACTION PHACO AND INTRAOCULAR LENS PLACEMENT (IOC) RIGHT 3.89 00:47.4;  Surgeon: Enola Feliciano Hugger, MD;  Location: [facility];  Service: Ophthalmology;  Laterality: Right;   CATARACT EXTRACTION W/PHACO Left 05/18/2022   Procedure: CATARACT EXTRACTION PHACO AND INTRAOCULAR LENS PLACEMENT (IOC) LEFT 1.44 00:19.7;  Surgeon: Enola Feliciano Hugger, MD;  Location:  [facility];  Service: Ophthalmology;  Laterality: Left;   COLONOSCOPY WITH PROPOFOL  N/A 11/22/2015   Procedure: COLONOSCOPY WITH PROPOFOL ;  Surgeon: Lamar ONEIDA Holmes, MD;  Location: [facility];  Service: Endoscopy;  Laterality: N/A;   CORONARY PRESSURE/FFR STUDY N/A 06/19/2018   Procedure: INTRAVASCULAR PRESSURE WIRE/FFR STUDY;  Surgeon: Mady Bruckner, MD;  Location: [facility];  Service: Cardiovascular;  Laterality: N/A;   ESOPHAGOGASTRODUODENOSCOPY (EGD) WITH PROPOFOL  N/A 11/22/2015   Procedure: ESOPHAGOGASTRODUODENOSCOPY (EGD) WITH  PROPOFOL ;  Surgeon: Lamar ONEIDA Holmes, MD;  Location: [facility] END[facility]COPY;  Service: Endoscopy;  Laterality: N/A;   LEFT HEART CATH AND CORONARY ANGIOGRAPHY N/A 06/19/2018   Procedure: LEFT HEART CATH AND CORONARY ANGIOGRAPHY;  Surgeon: Mady Bruckner, MD;  Location: [facility] CV [facility];  Service: Cardiovascular;  Laterality: N/A;   OOPHORECTOMY     TONSILLECTOMY     VAGINAL HYSTERECTOMY      Allergies: Allergies as of 02/12/2024 - Review Complete 02/12/2024  Allergen Reaction Noted   Amoxicillin Anaphylaxis 11/01/2022   Flagyl  [metronidazole ] Anaphylaxis 05/09/2019   Penicillin g Anaphylaxis and Other (See Comments) 12/17/2014   Iodinated contrast media Rash and Nausea And Vomiting 04/28/2014   Alpha-gal  02/21/2022   Flomax  [tamsulosin  hcl] Hives 03/12/2019    Medications: Outpatient Encounter Medications as of 02/12/2024  Medication Sig   atorvastatin  (LIPITOR) 80 MG tablet Take 1 tablet (80 mg total) by mouth daily.   clonazePAM  (KLONOPIN ) 1 MG tablet Take 1 tablet (1 mg total) by mouth 2 (two) times daily.   dimenhyDRINATE (DRAMAMINE) 50 MG tablet Take 50 mg by mouth every 8 (eight) hours as needed.   docusate sodium  (COLACE) 100 MG capsule Take 100 mg by mouth daily.    EPINEPHRINE  0.3 mg/0.3 mL IJ SOAJ injection INJECT CONTENTS OF 1 PEN INTO THE MUSCLE AS NEEDED FOR ALLERGIC REACTION   ezetimibe  (ZETIA ) 10 MG tablet Take 1 tablet (10 mg total) by mouth daily.   FLUoxetine  (PROZAC ) 40 MG capsule Take 1 capsule (40 mg total) by mouth daily.   meclizine  (ANTIVERT ) 25 MG tablet Take 1 tablet (25 mg total) by mouth 3 (three) times daily as needed.   nitroGLYCERIN  (NITROSTAT ) 0.4 MG SL tablet DISSOLVE ONE TABLET UNDER THE TONGUE EVERY 5 MINUTES AS NEEDED FOR CHEST PAIN.  DO NOT EXCEED A TOTAL OF 3 DOSES IN 15 MINUTES   tiZANidine  (ZANAFLEX ) 2 MG tablet Take 1 tablet (2 mg total) by mouth every 8 (eight) hours as needed for muscle spasms.   No facility-administered encounter medications  on file as of 02/12/2024.    Social History: Social History   Tobacco Use   Smoking status: Every Day    Current packs/day: 0.50    Average packs/day: 0.5 packs/day for 46.5 years (23.2 ttl pk-yrs)    Types: Cigarettes    Start date: 1979   Smokeless tobacco: Never   Tobacco comments:    Started smoking age 7    Smoking 1/2 PPD; 05/22/2023.  Vaping Use   Vaping status: Never Used  Substance Use Topics   Alcohol use: No    Alcohol/week: 0.0 standard drinks of alcohol   Drug use: No    Family Medical History: Family History  Problem Relation Age of Onset   Bladder Cancer Mother 54   Heart failure Mother    Renal Disease Mother    COPD Father    Bladder Cancer Maternal Aunt    Bladder Cancer Maternal Aunt    Breast cancer Maternal Aunt  Breast cancer Paternal Aunt 19   Colon cancer Maternal Grandmother    Cancer Paternal Grandfather        unk primary, metastatic, d 52s   Heart disease Other    COPD Other    Stroke Other    Prostate cancer Cousin        metastatic   Prostate cancer Cousin     Physical Examination: Vitals:   02/12/24 1535  BP: 134/86    General: Patient is well developed, well nourished, calm, collected, and in no apparent distress. Attention to examination is appropriate.  Respiratory: Patient is breathing without any difficulty.   NEUROLOGICAL:     Awake, alert, oriented to person, place, and time.  Speech is clear and fluent. Fund of knowledge is appropriate.   Cranial Nerves: Pupils equal round and reactive to light.  Facial tone is symmetric.     Diffuse lower posterior lumbar tenderness.   No abnormal lesions on exposed skin.   Strength: Side Biceps Triceps Deltoid Interossei Grip Wrist Ext. Wrist Flex.  R 5 5 5 5 5 5 5   L 5 5 5 5 5 5 5    Side Iliopsoas Quads Hamstring PF DF EHL  R 5 5 5 5 5 5   L 5 5 5 5 5 5      Reflexes are 3+ and symmetric at the biceps, brachioradialis, patella and achilles.   Cross adductor is  present.    Bilateral upper and lower extremity sensation is intact to light touch.     Gait is slow and unsteady.   Medical Decision Making  Imaging: EXAM: MRI THORACIC SPINE WITHOUT CONTRAST   TECHNIQUE: Multiplanar, multisequence MR imaging of the thoracic spine was performed. No intravenous contrast was administered.   COMPARISON:  CT chest 10/11/2022   FINDINGS: Alignment:  Mild thoracic levoscoliosis.  No significant listhesis.   Vertebrae: No acute fracture, suspicious marrow lesion, or significant marrow edema. Small hemangiomas in the T9 and T10 vertebral bodies. Unchanged mild chronic T11 superior endplate compression fracture/Schmorl's node deformity.   Cord:  Normal signal and morphology.   Paraspinal and other soft tissues: Unremarkable.   Disc levels:   T1-2: Tiny right paracentral disc protrusion without stenosis.   T2-3: Small central disc protrusion without stenosis.   T3-4: Negative.   T4-5: Negative.   T5-6: Tiny right central disc protrusion without stenosis.   T6-7: Small right central disc protrusion without stenosis.   T7-8: Negative.   T8-9: Minimal disc bulging without stenosis.   T9-10: Mild facet hypertrophy without disc herniation or stenosis.   T10-11: Mild facet hypertrophy without disc herniation or stenosis.   T11-12: Negative.   T12-L1: Negative.   IMPRESSION: 1. Mild thoracic spondylosis without stenosis. 2. Normal appearance of the thoracic spinal cord.   EXAM: MRI CERVICAL SPINE WITHOUT CONTRAST   TECHNIQUE: Multiplanar, multisequence MR imaging of the cervical spine was performed. No intravenous contrast was administered.   COMPARISON:  Previous cervical spine MRI 11/27/2013.   FINDINGS: Alignment: Stable straightening of cervical lordosis. No significant scoliosis or spondylolisthesis.   Vertebrae: Normal background bone marrow signal. Maintained vertebral height. No marrow edema or evidence of acute  osseous abnormality.   Cord: Up to mild degenerative cervical spinal cord mass effect (to tail below) with no associated cord signal abnormality. Maintained cord volume.   Negative visible upper thoracic canal and cord.   Posterior Fossa, vertebral arteries, paraspinal tissues: . preserved major vascular flow voids in the bilateral neck, although the right  vertebral artery appears congenitally dominant and the left diminutive (no change from 2015). Negative visible bilateral neck soft tissues and lung apices.   Disc levels:   C2-C3:  Negative.   C3-C4: Small central disc bulge or protrusion (series 18, image 17). Mild facet hypertrophy. Mild foraminal disc osteophyte complex on the left. Mild spinal stenosis with subtle ventral cord mass effect. Mild left C4 foraminal stenosis.   C4-C5: Mild disc bulging and endplate spurring. Mild facet hypertrophy. No spinal stenosis. Mild right C5 foraminal stenosis.   C5-C6: Mild circumferential disc bulge with a more broad-based posterior component of disc (series 9, image 27 and series 8, image 27). Mild facet and ligament flavum hypertrophy. Mild spinal stenosis. No convincing cord mass effect. Mild to moderate bilateral C6 foraminal stenosis.   C6-C7: Minimal disc bulging and endplate spurring. Mild facet hypertrophy. No stenosis.   C7-T1: Negative disc. Mild to moderate facet hypertrophy greater on the left. Mild to moderate left C8 neural foraminal stenosis.   IMPRESSION: 1. No acute osseous abnormality and generally mild for age cervical spine degeneration. 2. Borderline to mild spinal stenosis at C3-C4, greater than C5-C6, primarily due to disc degeneration. Up to mild ventral spinal cord mass effect at the former, no cord signal abnormality. 3. Up to moderate degenerative neural foraminal stenosis at the bilateral C6 and left C8 nerve levels. Mild foraminal stenosis otherwise.  EXAM: MRI LUMBAR SPINE WITHOUT CONTRAST    TECHNIQUE: Multiplanar, multisequence MR imaging of the lumbar spine was performed. No intravenous contrast was administered.   COMPARISON:  CT Abdomen and Pelvis 11/01/2022.   FINDINGS: Segmentation:  Normal on the comparison CT.   Alignment: Normal lordosis. No significant scoliosis or spondylolisthesis.   Vertebrae: Maintained vertebral body height. Normal background bone marrow signal. L5 posterior vertebral body benign hemangioma (normal variant). Intact visible sacrum and SI joints. No marrow edema or evidence of acute osseous abnormality.   Conus medullaris and cauda equina: Conus extends to the L1-L2 level. No lower spinal cord or conus signal abnormality. Cauda equina nerve roots are within normal limits.   Paraspinal and other soft tissues: Aortic atherosclerosis better demonstrated by CT. Otherwise negative visualized abdominal viscera and paraspinal soft tissues.   Disc levels:   T11-T12: Minor posterior disc bulging, facet hypertrophy. No stenosis.   T12-L1:  Negative.   L1-L2:  Negative.   L2-L3: Subtle disc desiccation. Mild circumferential disc bulge (series 8, image 14). Mild facet hypertrophy greater on the right. No spinal or lateral recess stenosis. Borderline to mild bilateral L2 foraminal stenosis.   L3-L4: Similar mild disc desiccation, circumferential disc bulge. Mild to moderate facet and ligament flavum hypertrophy. No spinal stenosis. Borderline to mild right lateral recess stenosis (right L4 nerve level series 8, image 19). Mild to moderate bilateral L3 neural foraminal stenosis, fairly symmetric.   L4-L5: Better maintained disc signal and height. Minor mostly far lateral disc bulging. Mild to moderate facet hypertrophy. No spinal or lateral recess stenosis. Mild to moderate left, mild right L4 foraminal stenosis.   L5-S1:  Negative.   IMPRESSION: 1. Generally mild for age lumbar spine degeneration with no significant spinal  stenosis. 2. Disc and posterior element degeneration combine for mild to moderate neural foraminal stenosis at the bilateral L3 and left > right L4 nerve levels.     Assessment and Plan: Lindsey Bautista comes in today for acute back pain x 5 days after moving clothes.  Patient recently had MRI of cervical, thoracic, and lumbar spine completed.  This was discussed at length with Dr. Katrina who is present in the office.  No significant areas of stenosis that would explain her pain.  Treatment options discussed with patient and following plan made:   - Recommend formal neurology evaluation -Referral sent to pain clinic for possible injections of both cervical and lumbar spine -Physical therapy referral also placed. -Due to acute exacerbation Medrol  Dosepak prescribed as well as Zanaflex .  Also recommended over-the-counter treatments like heat, ice, lidocaine . -Plan to see him back in approximately 8 weeks  I spent a total of 40 minutes in face-to-face and non-face-to-face activities related to this patient's care today including review of outside records, review of imaging, review of symptoms, physical exam, discussion of differential diagnosis, discussion of treatment options, and documentation.   Thank you for involving me in the care of this patient.     Margarie Mcguirt PA-C Dept. of Neurosurgery

## 2024-02-14 ENCOUNTER — Ambulatory Visit (INDEPENDENT_AMBULATORY_CARE_PROVIDER_SITE_OTHER): Admitting: Nurse Practitioner

## 2024-02-14 ENCOUNTER — Encounter (INDEPENDENT_AMBULATORY_CARE_PROVIDER_SITE_OTHER): Payer: Self-pay | Admitting: Nurse Practitioner

## 2024-02-14 ENCOUNTER — Ambulatory Visit (INDEPENDENT_AMBULATORY_CARE_PROVIDER_SITE_OTHER)

## 2024-02-14 VITALS — BP 129/73 | HR 57 | Ht 61.0 in | Wt 145.5 lb

## 2024-02-14 DIAGNOSIS — I728 Aneurysm of other specified arteries: Secondary | ICD-10-CM | POA: Diagnosis not present

## 2024-02-14 DIAGNOSIS — M79604 Pain in right leg: Secondary | ICD-10-CM

## 2024-02-14 DIAGNOSIS — M79605 Pain in left leg: Secondary | ICD-10-CM | POA: Diagnosis not present

## 2024-02-14 DIAGNOSIS — E782 Mixed hyperlipidemia: Secondary | ICD-10-CM | POA: Diagnosis not present

## 2024-02-14 NOTE — Progress Notes (Signed)
 Subjective:    Patient ID: Lindsey Bautista, female    DOB: 1961-05-17, 63 y.o.   MRN: [id number] Chief Complaint  Patient presents with   fu pt conv Iliac + ABI    The patient is a 63 year old female who returns today for follow-up evaluation regarding her ongoing leg pain.  Unfortunately the patient has suffered with leg pain ongoing for the last few years.  She notes that she has significant pain in her lower back.  She notes that when she tries to walk she has significant pain after short distances.  Even at rest she has significant pain in her bilateral legs in addition to numbness and tingling.  This is very uncomfortable for her as she has recently been evaluated by neurosurgery and she notes that they have recently recommended physical therapy following her recent studies.  There was concern that the pain and discomfort she is experiencing may be related to peripheral arterial disease and blood flow issues.  Today noninvasive studies show an ABI of 1.02 on the right and 1.03 on the left.  She has normal TBI's bilaterally.  She has strong triphasic tibial artery waveforms bilaterally with good toe waveforms bilaterally.  This is consistent with previous outside studies which showed ABIs that were within normal limits on 08/2023.  In addition we evaluated her aorta iliac system to determine if there are any significant stenoses present there that may account for her claudication-like symptoms.  There was mild atherosclerotic disease noted but nothing that is significant.  No evidence of an abdominal aortic aneurysm noted today.    Review of Systems  Musculoskeletal:  Positive for arthralgias, back pain and gait problem.  All other systems reviewed and are negative.      Objective:   Physical Exam Vitals reviewed.  HENT:     Head: Normocephalic.   Cardiovascular:     Rate and Rhythm: Normal rate.     Pulses:          Dorsalis pedis pulses are detected w/ Doppler on the right side and  detected w/ Doppler on the left side.       Posterior tibial pulses are detected w/ Doppler on the right side and detected w/ Doppler on the left side.  Pulmonary:     Effort: Pulmonary effort is normal.   Skin:    General: Skin is warm and dry.   Neurological:     Mental Status: She is alert and oriented to person, place, and time.   Psychiatric:        Mood and Affect: Mood normal.        Behavior: Behavior normal.        Thought Content: Thought content normal.        Judgment: Judgment normal.     BP 129/73   Pulse (!) 57   Ht 5' 1 (1.549 m)   Wt 145 lb 8 oz (66 kg)   BMI 27.49 kg/m   Past Medical History:  Diagnosis Date   Arthritis    Atypical chest pain    a. 08/2016 Neg MV.   COPD (chronic obstructive pulmonary disease) (HCC)    Coronary artery disease, non-occlusive    a.  LHC 06/19/2018: Mid LAD 50% with iFR 0.94   Depression    Diastolic dysfunction    a. 10/2016 Echo: EF 60-65%, no rwma, Gr1 DD, mild MR, nl LA size, nl RV fxn; b. 05/2018 Echo: EF 60-65%. No rwma. Mild MR. Nl RV  fxn.   Fibromyalgia    GERD (gastroesophageal reflux disease)    Hypotension    IBS (irritable bowel syndrome)    Right arm weakness    Situational syncope    Stroke (HCC)    2013, 2018.  Some short term memory issues, some weakness   TIA (transient ischemic attack)    Wears dentures    full upper    Social History   Socioeconomic History   Marital status: Married    Spouse name: Ubaldo Faden Heckler   Number of children: 2   Years of education: 12   Highest education level: High school graduate  Occupational History   Occupation: Unemployed   Occupation: disability  Tobacco Use   Smoking status: Every Day    Current packs/day: 0.50    Average packs/day: 0.5 packs/day for 46.5 years (23.2 ttl pk-yrs)    Types: Cigarettes    Start date: 1979   Smokeless tobacco: Never   Tobacco comments:    Started smoking age 38    Smoking 1/2 PPD; 05/22/2023.  Vaping Use    Vaping status: Never Used  Substance and Sexual Activity   Alcohol use: No    Alcohol/week: 0.0 standard drinks of alcohol   Drug use: No   Sexual activity: Not Currently    Birth control/protection: Surgical    Comment: Hysterectomy  Other Topics Concern   Not on file  Social History Narrative   Lives at home with husband.    Caffeine  use: 1 cup coffee per day   Social Drivers of Health   Financial Resource Strain: Low Risk  (11/01/2023)   Overall Financial Resource Strain (CARDIA)    Difficulty of Paying Living Expenses: Not hard at all  Food Insecurity: No Food Insecurity (11/01/2023)   Hunger Vital Sign    Worried About Running Out of Food in the Last Year: Never true    Ran Out of Food in the Last Year: Never true  Transportation Needs: No Transportation Needs (11/01/2023)   PRAPARE - Administrator, Civil Service (Medical): No    Lack of Transportation (Non-Medical): No  Physical Activity: Insufficiently Active (11/01/2023)   Exercise Vital Sign    Days of Exercise per Week: 7 days    Minutes of Exercise per Session: 10 min  Stress: Stress Concern Present (11/01/2023)   [employer] of Occupational Health - Occupational Stress Questionnaire    Feeling of Stress : To some extent  Social Connections: Moderately Isolated (11/01/2023)   Social Connection and Isolation Panel    Frequency of Communication with Friends and Family: More than three times a week    Frequency of Social Gatherings with Friends and Family: Once a week    Attends Religious Services: Never    Database administrator or Organizations: No    Attends Banker Meetings: Never    Marital Status: Married  Catering manager Violence: Not At Risk (11/01/2023)   Humiliation, Afraid, Rape, and Kick questionnaire    Fear of Current or Ex-Partner: No    Emotionally Abused: No    Physically Abused: No    Sexually Abused: No    Past Surgical History:  Procedure Laterality Date    CATARACT EXTRACTION W/PHACO Right 05/04/2022   Procedure: CATARACT EXTRACTION PHACO AND INTRAOCULAR LENS PLACEMENT (IOC) RIGHT 3.89 00:47.4;  Surgeon: Enola Feliciano Hugger, MD;  Location: [facility];  Service: Ophthalmology;  Laterality: Right;   CATARACT EXTRACTION W/PHACO Left 05/18/2022   Procedure:  CATARACT EXTRACTION PHACO AND INTRAOCULAR LENS PLACEMENT (IOC) LEFT 1.44 00:19.7;  Surgeon: Enola Feliciano Hugger, MD;  Location: [facility];  Service: Ophthalmology;  Laterality: Left;   COLONOSCOPY WITH PROPOFOL  N/A 11/22/2015   Procedure: COLONOSCOPY WITH PROPOFOL ;  Surgeon: Lamar ONEIDA Holmes, MD;  Location: [facility];  Service: Endoscopy;  Laterality: N/A;   CORONARY PRESSURE/FFR STUDY N/A 06/19/2018   Procedure: INTRAVASCULAR PRESSURE WIRE/FFR STUDY;  Surgeon: Mady Bruckner, MD;  Location: [facility] INVAS[facility];  Service: Cardiovascular;  Laterality: N/A;   ESOPHAGOGASTRODUODENOSCOPY (EGD) WITH PROPOFOL  N/A 11/22/2015   Procedure: ESOPHAGOGASTRODUODENOSCOPY (EGD) WITH PROPOFOL ;  Surgeon: Lamar ONEIDA Holmes, MD;  Location: [facility];  Service: Endoscopy;  Laterality: N/A;   LEFT HEART CATH AND CORONARY ANGIOGRAPHY N/A 06/19/2018   Procedure: LEFT HEART CATH AND CORONARY ANGIOGRAPHY;  Surgeon: Mady Bruckner, MD;  Location: [facility] INVAS[facility] CV [facility];  Service: Cardiovascular;  Laterality: N/A;   OOPHORECTOMY     TONSILLECTOMY     VAGINAL HYSTERECTOMY      Family History  Problem Relation Age of Onset   Bladder Cancer Mother 10   Heart failure Mother    Renal Disease Mother    COPD Father    Bladder Cancer Maternal Aunt    Bladder Cancer Maternal Aunt    Breast cancer Maternal Aunt    Breast cancer Paternal Aunt 79   Colon cancer Maternal Grandmother    Cancer Paternal Grandfather        unk primary, metastatic, d 41s   Heart disease Other    COPD Other    Stroke Other    Prostate cancer Cousin        metastatic   Prostate cancer Cousin     Allergies   Allergen Reactions   Amoxicillin Anaphylaxis    Facial swelling and SOB   Flagyl  [Metronidazole ] Anaphylaxis   Penicillin G Anaphylaxis and Other (See Comments)    Has patient had a PCN reaction causing immediate rash, facial/tongue/throat swelling, SOB or lightheadedness with hypotension: [id number]} Has patient had a PCN reaction causing severe rash involving mucus membranes or skin necrosis: no:[id number]} Has patient had a PCN reaction that required hospitalization no:[id number]} Has patient had a PCN reaction occurring within the last 10 years: [id number]} If all of the above answers are NO, then may proceed with Cephalosporin use.    Iodinated Contrast Media Rash and Nausea And Vomiting   Alpha-Gal    Flomax  [Tamsulosin  Hcl] Hives    Per ER visit 03/11/19       Latest Ref Rng & Units 08/31/2023    1:15 PM 07/07/2023    4:49 PM 03/02/2023    1:43 PM  CBC  WBC 3.4 - 10.8 x10E3/uL 4.9  5.5  5.2   Hemoglobin 11.1 - 15.9 g/dL 86.7  85.6  86.6   Hematocrit 34.0 - 46.6 % 39.9  43.2  39.5   Platelets 150 - 450 x10E3/uL 256  241  233       CMP     Component Value Date/Time   NA 140 08/31/2023 1315   NA 144 04/27/2014 1404   K 4.3 08/31/2023 1315   K 3.8 04/27/2014 1404   CL 101 08/31/2023 1315   CL 106 04/27/2014 1404   CO2 21 08/31/2023 1315   CO2 27 04/27/2014 1404   GLUCOSE 55 (L) 08/31/2023 1315   GLUCOSE 85 07/07/2023 1649   GLUCOSE 85 04/27/2014 1404   BUN 10 08/31/2023 1315   BUN 7 04/27/2014 1404   CREATININE  0.91 08/31/2023 1315   CREATININE 0.81 04/27/2014 1404   CALCIUM  9.4 08/31/2023 1315   CALCIUM  8.9 04/27/2014 1404   PROT 6.6 08/31/2023 1315   PROT 8.0 11/27/2013 1053   ALBUMIN 4.3 08/31/2023 1315   ALBUMIN 3.9 11/27/2013 1053   AST 22 08/31/2023 1315   AST 24 09/02/2018 1056   AST 29 11/27/2013 1053   ALT 15 08/31/2023 1315   ALT 17 09/02/2018 1056   ALT 23 11/27/2013 1053   ALKPHOS 87 08/31/2023 1315   ALKPHOS 73 11/27/2013 1053   BILITOT  0.4 08/31/2023 1315   BILITOT 0.4 11/27/2013 1053   EGFR 71 08/31/2023 1315   GFRNONAA >60 07/07/2023 1649   GFRNONAA >60 04/27/2014 1404     No results found.     Assessment & Plan:   1. Aneurysm of splenic artery (HCC) (Primary) Patient was previously seen on 01/07/2024 with review of her splenic artery aneurysms.  The reemphasized the discussion that she had with Dr. Jama and that she has asymptomatic splenic artery aneurysms that are less than 2.5 cm in maximal diameter and the natural history of splenic artery aneurysms is very small risk of rupturing aneurysm less than 2.5 cm in size.  Her aneurysms are calcific in nature measuring approximately 1 cm and 0.80 cm on last studies.  Will plan to have the patient return in 1 year with mesenteric studies.  2. Pain in both lower extremities Recommend:  The patient has atypical pain symptoms for vascular disease and on exam I do not find evidence of vascular pathology that would explain the patient's symptoms.  Noninvasive studies do not identify significant vascular problems  I suspect the patient is c/o pseudoclaudication.  Patient should continue with neurosurgery for treatment of her lower back issues.  Will continue to follow the patient for her splenic artery aneurysm as noted above  3. Mixed hyperlipidemia Continue statin as ordered and reviewed, no changes at this time   Current Outpatient Medications on File Prior to Visit  Medication Sig Dispense Refill   atorvastatin  (LIPITOR) 80 MG tablet Take 1 tablet (80 mg total) by mouth daily. 90 tablet 4   clonazePAM  (KLONOPIN ) 1 MG tablet Take 1 tablet (1 mg total) by mouth 2 (two) times daily. 60 tablet 2   dimenhyDRINATE (DRAMAMINE) 50 MG tablet Take 50 mg by mouth every 8 (eight) hours as needed.     docusate sodium  (COLACE) 100 MG capsule Take 100 mg by mouth daily.      EPINEPHRINE  0.3 mg/0.3 mL IJ SOAJ injection INJECT CONTENTS OF 1 PEN INTO THE MUSCLE AS NEEDED FOR  ALLERGIC REACTION 2 each 0   ezetimibe  (ZETIA ) 10 MG tablet Take 1 tablet (10 mg total) by mouth daily. 90 tablet 4   FLUoxetine  (PROZAC ) 40 MG capsule Take 1 capsule (40 mg total) by mouth daily. 90 capsule 4   meclizine  (ANTIVERT ) 25 MG tablet Take 1 tablet (25 mg total) by mouth 3 (three) times daily as needed. 15 tablet 0   methylPREDNISolone  (MEDROL  DOSEPAK) 4 MG TBPK tablet Take by mouth daily, taper daily dose per package instructions. 21 tablet 0   nitroGLYCERIN  (NITROSTAT ) 0.4 MG SL tablet DISSOLVE ONE TABLET UNDER THE TONGUE EVERY 5 MINUTES AS NEEDED FOR CHEST PAIN.  DO NOT EXCEED A TOTAL OF 3 DOSES IN 15 MINUTES 50 tablet 0   tiZANidine  (ZANAFLEX ) 2 MG tablet Take 1 tablet (2 mg total) by mouth every 8 (eight) hours as needed for muscle spasms. 90  tablet 1   No current facility-administered medications on file prior to visit.    There are no Patient Instructions on file for this visit. No follow-ups on file.   Emmaleigh Longo E Jesusmanuel Erbes, NP

## 2024-02-19 ENCOUNTER — Ambulatory Visit: Admitting: Orthopedic Surgery

## 2024-02-23 NOTE — Patient Instructions (Signed)
 Managing Depression, Adult Depression is a mental health condition that affects your thoughts, feelings, and actions. Being diagnosed with depression can bring you relief if you did not know why you have felt or behaved a certain way. It could also leave you feeling overwhelmed. Finding ways to manage your symptoms can help you feel more positive about your future. How to manage lifestyle changes Being depressed is difficult. Depression can increase the level of everyday stress. Stress can make depression symptoms worse. You may believe your symptoms cannot be managed or will never improve. However, there are many things you can try to help manage your symptoms. There is hope. Managing stress  Stress is your body's reaction to life changes and events, both good and bad. Stress can add to your feelings of depression. Learning to manage your stress can help lessen your feelings of depression. Try some of the following approaches to reducing your stress (stress reduction techniques): Listen to music that you enjoy and that inspires you. Try using a meditation app or take a meditation class. Develop a practice that helps you connect with your spiritual self. Walk in nature, pray, or go to a place of worship. Practice deep breathing. To do this, inhale slowly through your nose. Pause at the top of your inhale for a few seconds and then exhale slowly, letting yourself relax. Repeat this three or four times. Practice yoga to help relax and work your muscles. Choose a stress reduction technique that works for you. These techniques take time and practice to develop. Set aside 5-15 minutes a day to do them. Therapists can offer training in these techniques. Do these things to help manage stress: Keep a journal. Know your limits. Set healthy boundaries for yourself and others, such as saying "no" when you think something is too much. Pay attention to how you react to certain situations. You may not be able to  control everything, but you can change your reaction. Add humor to your life by watching funny movies or shows. Make time for activities that you enjoy and that relax you. Spend less time using electronics, especially at night before bed. The light from screens can make your brain think it is time to get up rather than go to bed.  Medicines Medicines, such as antidepressants, are often a part of treatment for depression. Talk with your pharmacist or health care provider about all the medicines, supplements, and herbal products that you take, their possible side effects, and what medicines and other products are safe to take together. Make sure to report any side effects you may have to your health care provider. Relationships Your health care provider may suggest family therapy, couples therapy, or individual therapy as part of your treatment. How to recognize changes Everyone responds differently to treatment for depression. As you recover from depression, you may start to: Have more interest in doing activities. Feel more hopeful. Have more energy. Eat a more regular amount of food. Have better mental focus. It is important to recognize if your depression is not getting better or is getting worse. The symptoms you had in the beginning may return, such as: Feeling tired. Eating too much or too little. Sleeping too much or too little. Feeling restless, agitated, or hopeless. Trouble focusing or making decisions. Having unexplained aches and pains. Feeling irritable, angry, or aggressive. If you or your family members notice these symptoms coming back, let your health care provider know right away. Follow these instructions at home: Activity Try to  get some form of exercise each day, such as walking. Try yoga, mindfulness, or other stress reduction techniques. Participate in group activities if you are able. Lifestyle Get enough sleep. Cut down on or stop using caffeine, tobacco,  alcohol, and any other harmful substances. Eat a healthy diet that includes plenty of vegetables, fruits, whole grains, low-fat dairy products, and lean protein. Limit foods that are high in solid fats, added sugar, or salt (sodium). General instructions Take over-the-counter and prescription medicines only as told by your health care provider. Keep all follow-up visits. It is important for your health care provider to check on your mood, behavior, and medicines. Your health care provider may need to make changes to your treatment. Where to find support Talking to others  Friends and family members can be sources of support and guidance. Talk to trusted friends or family members about your condition. Explain your symptoms and let them know that you are working with a health care provider to treat your depression. Tell friends and family how they can help. Finances Find mental health providers that fit with your financial situation. Talk with your health care provider if you are worried about access to food, housing, or medicine. Call your insurance company to learn about your co-pays and prescription plan. Where to find more information You can find support in your area from: Anxiety and Depression Association of America (ADAA): adaa.org Mental Health [place]: mentalhealthamerica.net [employer] on Mental Illness: nami.org Contact a health care provider if: You stop taking your antidepressant medicines, and you have any of these symptoms: Nausea. Headache. Light-headedness. Chills and body aches. Not being able to sleep (insomnia). You or your friends and family think your depression is getting worse. Get help right away if: You have thoughts of hurting yourself or others. Get help right away if you feel like you may hurt yourself or others, or have thoughts about taking your own life. Go to your nearest emergency room or: Call 911. Call the National Suicide Prevention Lifeline at  360-042-0264 or 988. This is open 24 hours a day. Text the Crisis Text Line at 952-302-5572. This information is not intended to replace advice given to you by your health care provider. Make sure you discuss any questions you have with your health care provider. Document Revised: 12/13/2021 Document Reviewed: 12/13/2021 Elsevier Patient Education  2024 [employer].

## 2024-02-27 ENCOUNTER — Encounter: Payer: Self-pay | Admitting: Nurse Practitioner

## 2024-02-27 ENCOUNTER — Ambulatory Visit: Admitting: Nurse Practitioner

## 2024-02-27 VITALS — BP 118/71 | HR 67 | Temp 98.0°F | Ht 61.0 in | Wt 148.0 lb

## 2024-02-27 DIAGNOSIS — G8929 Other chronic pain: Secondary | ICD-10-CM

## 2024-02-27 DIAGNOSIS — E782 Mixed hyperlipidemia: Secondary | ICD-10-CM | POA: Diagnosis not present

## 2024-02-27 DIAGNOSIS — F17210 Nicotine dependence, cigarettes, uncomplicated: Secondary | ICD-10-CM

## 2024-02-27 DIAGNOSIS — F339 Major depressive disorder, recurrent, unspecified: Secondary | ICD-10-CM

## 2024-02-27 DIAGNOSIS — I728 Aneurysm of other specified arteries: Secondary | ICD-10-CM | POA: Diagnosis not present

## 2024-02-27 DIAGNOSIS — D3501 Benign neoplasm of right adrenal gland: Secondary | ICD-10-CM

## 2024-02-27 DIAGNOSIS — F419 Anxiety disorder, unspecified: Secondary | ICD-10-CM

## 2024-02-27 DIAGNOSIS — I700 Atherosclerosis of aorta: Secondary | ICD-10-CM

## 2024-02-27 DIAGNOSIS — M797 Fibromyalgia: Secondary | ICD-10-CM

## 2024-02-27 DIAGNOSIS — J432 Centrilobular emphysema: Secondary | ICD-10-CM

## 2024-02-27 DIAGNOSIS — F1320 Sedative, hypnotic or anxiolytic dependence, uncomplicated: Secondary | ICD-10-CM

## 2024-02-27 DIAGNOSIS — M5441 Lumbago with sciatica, right side: Secondary | ICD-10-CM

## 2024-02-27 DIAGNOSIS — M5442 Lumbago with sciatica, left side: Secondary | ICD-10-CM

## 2024-02-27 MED ORDER — EZETIMIBE 10 MG PO TABS: 10.0000 mg | ORAL_TABLET | Freq: Every day | ORAL | 4 refills | Status: AC

## 2024-02-27 MED ORDER — FLUOXETINE HCL 40 MG PO CAPS: 40.0000 mg | ORAL_CAPSULE | Freq: Every day | ORAL | 4 refills | Status: AC

## 2024-02-27 MED ORDER — ATORVASTATIN CALCIUM 80 MG PO TABS: 80.0000 mg | ORAL_TABLET | Freq: Every day | ORAL | 4 refills | Status: AC

## 2024-02-27 MED ORDER — CLONAZEPAM 1 MG PO TABS: 1.0000 mg | ORAL_TABLET | Freq: Two times a day (BID) | ORAL | 2 refills | Status: DC

## 2024-02-27 NOTE — Assessment & Plan Note (Signed)
Chronic, ongoing.  Continue current medication regimen and collaboration with cardiology.  Lipid panel today. 

## 2024-02-27 NOTE — Assessment & Plan Note (Signed)
Chronic, ongoing, noted on October 2019 lung screening CT.  Continue statin and ASA daily + recommend complete cessation of smoking. 

## 2024-02-27 NOTE — Assessment & Plan Note (Signed)
 I have recommended complete cessation of tobacco use. I have discussed various options available for assistance with tobacco cessation including over the counter methods (Nicotine gum, patch and lozenges). We also discussed prescription options (Chantix, Nicotine Inhaler / Nasal Spray). The patient is not interested in pursuing any prescription tobacco cessation options at this time.

## 2024-02-27 NOTE — Assessment & Plan Note (Signed)
 Chronic, ongoing.  Continue current medication regimen and adjust as needed.  Denies SI/HI.  Consider change from Prozac to Cymbalta in future, which would offer benefit with anxiety and depression + fibromyalgia/alpha gal -- she refuses this change today.  Very anxious about changes.  Referral to therapy placed in past, she never attended.  Continues to be concerned about health.

## 2024-02-27 NOTE — Assessment & Plan Note (Signed)
 Remaining stable on recent imaging 10/16/23, educated her on this.

## 2024-02-27 NOTE — Progress Notes (Signed)
 BP 118/71   Pulse 67   Temp 98 F (36.7 C) (Oral)   Ht 5' 1 (1.549 m)   Wt 148 lb (67.1 kg)   SpO2 97%   BMI 27.96 kg/m    Subjective:    Patient ID: Lindsey Bautista, female    DOB: 12/02/1960, 63 y.o.   MRN: [id number]  HPI: Lindsey Bautista is a 63 y.o. female  Chief Complaint  Patient presents with   Anxiety   COPD   Hyperlipidemia   HYPERLIPIDEMIA Continues to take Atorvastatin  and Zetia . History of CVA, >11 years ago.  Aortic atherosclerosis noted on past imaging. Last saw cardiology on 11/30/23. Hyperlipidemia status: good compliance Satisfied with current treatment?  yes Side effects:  no Medication compliance: good compliance Supplements: none Aspirin :  yes The ASCVD Risk score (Arnett DK, et al., 2019) failed to calculate for the following reasons:   Risk score cannot be calculated because patient has a medical history suggesting prior/existing ASCVD Chest pain:  no Coronary artery disease:  no Family history CAD:  yes Family history early CAD:  no   COPD Centrilobular & paraseptal emphysema, no current inhalers. Last CT screening 10/16/23 which noted two peripherally calcified splenic artery aneurysms and ongoing right adrenal adenoma. Is following with vascular for aneurysms, last visit 02/14/24.  She is to return in one year for follow-up and imaging.  Continues to smoke <1 PPD, has smoked for >40 years. Depends on day, sometimes she goes days without it. COPD status: stable Satisfied with current treatment?: yes Oxygen use: no Dyspnea frequency: no Cough frequency: no Rescue inhaler frequency: none Limitation of activity: no Productive cough: none Last Spirometry: unknown Pneumovax: refuses Influenza: Up to Date   FIBROMYALGIA AND B12 DEFICIENCY Is currently following with neurosurgery for low back pain, last visit 02/12/24 and they referred her to pain clinic and PT. Saw rheumatology last 04/04/22.  She reports vascular told her to get second opinion  from a different neurosurgeon for low back pain treatment. Has numbness down both legs with the pain.  Uses Tizanidine  as needed. Duration: months Pain: yes Symmetric: yes - 10/10 pain daily Quality: dull, aching, and throbbing Frequency: constant Context:  worse Decreased function/range of motion: yes Erythema: none Swelling: yes  Heat or warmth: none Morning stiffness: yes Aggravating factors: lifting heavy Alleviating factors: nothing Relief with NSAIDs?: No NSAIDs Taken Treatments attempted:  rest and heat  Involved Joints:     Hands: yes bilateral    Wrists: yes bilateral     Elbows: yes bilateral    Shoulders: yes bilateral    Back: yes     Hips: yes bilateral    Knees: yes bilateral    Ankles: yes bilateral    Feet: yes bilateral   DEPRESSION/ANXIETY  Taking Prozac  40 MG daily and Klonopin  1 MG BID.  Pt is aware of risks of benzo medication use to include increased sedation, respiratory suppression, falls, dependence and cardiovascular events.  Pt would like to continue treatment as benefit determined to outweigh risk -- has been on this for many years.  PDMP last Klonopin  fill 01/31/24.  No other controlled substances noted. She refuses psychiatry -- states she saw them in past and does not want to return. Mood status: stable Satisfied with current treatment?: yes Symptom severity: mild  Duration of current treatment : chronic Side effects: no Medication compliance: good compliance Psychotherapy/counseling: none Previous psychiatric medications: Prozac  and Klonopin  Depressed mood: yes at times Anxious mood: yes Anhedonia:  no Significant weight loss or gain: no Insomnia: at times due to back pain Fatigue: occasional Feelings of worthlessness or guilt: none Impaired concentration/indecisiveness: no Suicidal ideations: no Hopelessness: no Crying spells: no    02/27/2024    1:14 PM 11/01/2023    9:38 AM 08/31/2023    1:12 PM 05/30/2023    1:17 PM 03/02/2023    1:23  PM  Depression screen PHQ 2/9  Decreased Interest 1 1 1 2 3   Down, Depressed, Hopeless 2 1 2 2 2   PHQ - 2 Score 3 2 3 4 5   Altered sleeping 0 1 1 2 2   Tired, decreased energy 3 1 1 3 3   Change in appetite 1 1 2 2 3   Feeling bad or failure about yourself  2 1 2 2 3   Trouble concentrating 1 1 1 2 2   Moving slowly or fidgety/restless 1 0 2 0 2  Suicidal thoughts 0 0 0 0 0  PHQ-9 Score 11 7 12 15 20   Difficult doing work/chores Somewhat difficult Somewhat difficult Very difficult Very difficult Very difficult      02/27/2024    1:14 PM 08/31/2023    1:12 PM 05/30/2023    1:17 PM 03/02/2023    1:23 PM  GAD 7 : Generalized Anxiety Score  Nervous, Anxious, on Edge 2 2 3 3   Control/stop worrying 2 2 3 2   Worry too much - different things 3 2 3 2   Trouble relaxing 2 2 1 2   Restless 1 1 2 2   Easily annoyed or irritable 2 2 2 3   Afraid - awful might happen 2 2 2 3   Total GAD 7 Score 14 13 16 17   Anxiety Difficulty Somewhat difficult Very difficult Very difficult Very difficult   Relevant past medical, surgical, family and social history reviewed and updated as indicated. Interim medical history since our last visit reviewed. Allergies and medications reviewed and updated.  Review of Systems  Constitutional:  Negative for activity change, appetite change, diaphoresis, fatigue and fever.  Respiratory:  Negative for cough, chest tightness, shortness of breath and wheezing.   Cardiovascular:  Negative for chest pain, palpitations and leg swelling.  Gastrointestinal: Negative.   Endocrine: Negative for cold intolerance and heat intolerance.  Musculoskeletal:  Positive for arthralgias.  Neurological: Negative.   Psychiatric/Behavioral:  Negative for decreased concentration, self-injury, sleep disturbance and suicidal ideas. The patient is nervous/anxious.    Per HPI unless specifically indicated above     Objective:    BP 118/71   Pulse 67   Temp 98 F (36.7 C) (Oral)   Ht 5' 1  (1.549 m)   Wt 148 lb (67.1 kg)   SpO2 97%   BMI 27.96 kg/m   Wt Readings from Last 3 Encounters:  02/27/24 148 lb (67.1 kg)  02/14/24 145 lb 8 oz (66 kg)  02/12/24 148 lb (67.1 kg)    Physical Exam Vitals and nursing note reviewed.  Constitutional:      General: She is awake. She is not in acute distress.    Appearance: She is well-developed, well-groomed and overweight. She is not ill-appearing or toxic-appearing.  HENT:     Head: Normocephalic.     Right Ear: Hearing and external ear normal.     Left Ear: Hearing and external ear normal.  Eyes:     General: Lids are normal.        Right eye: No discharge.        Left eye: No discharge.  Extraocular Movements: Extraocular movements intact.     Conjunctiva/sclera: Conjunctivae normal.     Pupils: Pupils are equal, round, and reactive to light.     Visual Fields: Right eye visual fields normal and left eye visual fields normal.  Neck:     Thyroid : No thyromegaly.     Vascular: No carotid bruit.  Cardiovascular:     Rate and Rhythm: Normal rate and regular rhythm.     Heart sounds: Normal heart sounds. No murmur heard.    No gallop.  Pulmonary:     Effort: Pulmonary effort is normal. No accessory muscle usage or respiratory distress.     Breath sounds: Normal breath sounds. No decreased breath sounds or wheezing.  Chest:  Breasts:    Right: Normal.     Left: Normal.  Abdominal:     General: Bowel sounds are normal.     Palpations: Abdomen is soft.     Tenderness: There is no right CVA tenderness, left CVA tenderness, guarding or rebound.     Hernia: No hernia is present.  Musculoskeletal:     Cervical back: Normal range of motion and neck supple.     Right lower leg: No edema.     Left lower leg: No edema.  Lymphadenopathy:     Cervical: No cervical adenopathy.     Upper Body:     Right upper body: No supraclavicular, axillary or pectoral adenopathy.     Left upper body: No supraclavicular, axillary or pectoral  adenopathy.  Skin:    General: Skin is warm and dry.  Neurological:     Mental Status: She is alert and oriented to person, place, and time.     Cranial Nerves: Cranial nerves 2-12 are intact.     Sensory: Sensation is intact.     Motor: Motor function is intact.     Coordination: Coordination is intact.     Gait: Gait is intact.     Deep Tendon Reflexes: Reflexes are normal and symmetric.     Reflex Scores:      Brachioradialis reflexes are 2+ on the right side and 2+ on the left side.      Patellar reflexes are 2+ on the right side and 2+ on the left side. Psychiatric:        Attention and Perception: Attention normal.        Mood and Affect: Mood normal.        Speech: Speech normal.        Behavior: Behavior normal. Behavior is cooperative.        Thought Content: Thought content normal.    Results for orders placed or performed in visit on 11/27/23  Microscopic Examination   Collection Time: 11/27/23  1:18 PM   Urine  Result Value Ref Range   WBC, UA 0-5 0 - 5 /hpf   RBC, Urine 0-2 0 - 2 /hpf   Epithelial Cells (non renal) None seen 0 - 10 /hpf   Bacteria, UA None seen None seen/Few  Urinalysis, Routine w reflex microscopic   Collection Time: 11/27/23  1:18 PM  Result Value Ref Range   Specific Gravity, UA 1.020 1.005 - 1.030   pH, UA 5.5 5.0 - 7.5   Color, UA Yellow Yellow   Appearance Ur Hazy (A) Clear   Leukocytes,UA Negative Negative   Protein,UA Negative Negative/Trace   Glucose, UA Negative Negative   Ketones, UA Negative Negative   RBC, UA Trace (A) Negative   Bilirubin, UA  Negative Negative   Urobilinogen, Ur 0.2 0.2 - 1.0 mg/dL   Nitrite, UA Negative Negative   Microscopic Examination See below:       Assessment & Plan:   Problem List Items Addressed This Visit       Cardiovascular and Mediastinum   Aortic atherosclerosis (HCC)   Chronic, ongoing, noted on October 2019 lung screening CT.  Continue statin and ASA daily + recommend complete  cessation of smoking.      Relevant Medications   atorvastatin  (LIPITOR) 80 MG tablet   ezetimibe  (ZETIA ) 10 MG tablet   Other Relevant Orders   Comprehensive metabolic panel with GFR   Lipid Panel w/o Chol/HDL Ratio   Aneurysm of splenic artery (HCC)   Noted on lung screening 10/16/23. Following with vascular, will continue this collaboration.  Recent notes reviewed.      Relevant Medications   atorvastatin  (LIPITOR) 80 MG tablet   ezetimibe  (ZETIA ) 10 MG tablet   Other Relevant Orders   Comprehensive metabolic panel with GFR   Lipid Panel w/o Chol/HDL Ratio     Respiratory   Centrilobular emphysema (HCC)   Chronic, ongoing.  No current inhaler regimen.  Denies any symptoms.  Continue yearly lung screening and plan on spirometry next visit to assess function.  Recommend complete cessation of smoking.          Endocrine   Adrenal adenoma, right   Remaining stable on recent imaging 10/16/23, educated her on this.        Nervous and Auditory   Chronic midline low back pain with bilateral sciatica   Chronic, worsening.  No benefit from OTC medications or current scripts.  She is requesting second opinion from neurosurgery.  Place referral to Dr. Louis who she requested.      Relevant Medications   clonazePAM  (KLONOPIN ) 1 MG tablet (Start on 02/29/2024)   FLUoxetine  (PROZAC ) 40 MG capsule   Other Relevant Orders   Ambulatory referral to Neurosurgery     Other   Nicotine  dependence, cigarettes, uncomplicated   I have recommended complete cessation of tobacco use. I have discussed various options available for assistance with tobacco cessation including over the counter methods (Nicotine  gum, patch and lozenges). We also discussed prescription options (Chantix, Nicotine  Inhaler / Nasal Spray). The patient is not interested in pursuing any prescription tobacco cessation options at this time.       Hyperlipidemia   Chronic, ongoing.  Continue current medication regimen and  collaboration with cardiology.  Lipid panel today.      Relevant Medications   atorvastatin  (LIPITOR) 80 MG tablet   ezetimibe  (ZETIA ) 10 MG tablet   Other Relevant Orders   Comprehensive metabolic panel with GFR   Lipid Panel w/o Chol/HDL Ratio   Fibromyalgia   Chronic, ongoing.  Recommended she try stopping Prozac  and change to Duloxetine which would benefit mood and pain, she refuses this - fearful of change.  Continue Trazodone as needed.  Tried Gabapentin  in past and did not like this.      Relevant Medications   clonazePAM  (KLONOPIN ) 1 MG tablet (Start on 02/29/2024)   FLUoxetine  (PROZAC ) 40 MG capsule   Depression, recurrent (HCC) - Primary   Chronic, ongoing.  Continue current medication regimen and adjust as needed.  Denies SI/HI.  Consider change from Prozac  to Cymbalta in future, which would offer benefit with anxiety and depression + fibromyalgia/alpha gal -- she refuses this change today.  Very anxious about changes.  Referral to therapy placed in  past, she never attended.  Continues to be concerned about health.      Relevant Medications   FLUoxetine  (PROZAC ) 40 MG capsule   Benzodiazepine dependence, continuous (HCC)   Refer to anxiety plan for updates and discussion. UDS obtained due next 08/30/24.  Contract next visit.      Anxiety   Chronic, ongoing.  Continue Klonopin  at this time, patient educated on risks of medication and is not interested in reducing -- refills sent in.  Discussed we would not increase this, unless she saw psychiatry.  Continue Prozac  40 MG which benefits anxiety and fibromyalgia, discussed with her changing to Zoloft or Duloxetine (which would benefit chronic pain issues) if increase anxiety/depression noted -- refuses.  At this time she denies SI/HI and wishes to maintain current regimen.  UDS yearly (next 08/30/24) and contract next visit.  Return in 3 months.       Relevant Medications   clonazePAM  (KLONOPIN ) 1 MG tablet (Start on 02/29/2024)    FLUoxetine  (PROZAC ) 40 MG capsule     Follow up plan: Return in about 3 months (around 05/29/2024) for ANXIETY.

## 2024-02-27 NOTE — Assessment & Plan Note (Signed)
 Refer to anxiety plan for updates and discussion. UDS obtained due next 08/30/24.  Contract next visit.

## 2024-02-27 NOTE — Assessment & Plan Note (Signed)
 Noted on lung screening 10/16/23. Following with vascular, will continue this collaboration.  Recent notes reviewed.

## 2024-02-27 NOTE — Assessment & Plan Note (Signed)
 Chronic, worsening.  No benefit from OTC medications or current scripts.  She is requesting second opinion from neurosurgery.  Place referral to Dr. Louis who she requested.

## 2024-02-27 NOTE — Assessment & Plan Note (Signed)
 Chronic, ongoing.  Recommended she try stopping Prozac and change to Duloxetine which would benefit mood and pain, she refuses this - fearful of change.  Continue Trazodone as needed.  Tried Gabapentin in past and did not like this.

## 2024-02-27 NOTE — Assessment & Plan Note (Signed)
Chronic, ongoing.  No current inhaler regimen.  Denies any symptoms.  Continue yearly lung screening and plan on spirometry next visit to assess function.  Recommend complete cessation of smoking.   

## 2024-02-27 NOTE — Assessment & Plan Note (Signed)
 Chronic, ongoing.  Continue Klonopin at this time, patient educated on risks of medication and is not interested in reducing -- refills sent in.  Discussed we would not increase this, unless she saw psychiatry.  Continue Prozac 40 MG which benefits anxiety and fibromyalgia, discussed with her changing to Zoloft or Duloxetine (which would benefit chronic pain issues) if increase anxiety/depression noted -- refuses.  At this time she denies SI/HI and wishes to maintain current regimen.  UDS yearly (next 08/30/24) and contract next visit.  Return in 3 months.

## 2024-02-28 ENCOUNTER — Ambulatory Visit: Payer: Self-pay | Admitting: Nurse Practitioner

## 2024-02-28 LAB — COMPREHENSIVE METABOLIC PANEL WITH GFR
ALT: 17 IU/L (ref 0–32)
AST: 22 IU/L (ref 0–40)
Albumin: 4.3 g/dL (ref 3.9–4.9)
Alkaline Phosphatase: 94 IU/L (ref 44–121)
BUN/Creatinine Ratio: 6 — ABNORMAL LOW (ref 12–28)
BUN: 6 mg/dL — ABNORMAL LOW (ref 8–27)
Bilirubin Total: 0.7 mg/dL (ref 0.0–1.2)
CO2: 23 mmol/L (ref 20–29)
Calcium: 9.7 mg/dL (ref 8.7–10.3)
Chloride: 104 mmol/L (ref 96–106)
Creatinine, Ser: 0.93 mg/dL (ref 0.57–1.00)
Globulin, Total: 2.3 g/dL (ref 1.5–4.5)
Glucose: 74 mg/dL (ref 70–99)
Potassium: 4.3 mmol/L (ref 3.5–5.2)
Sodium: 142 mmol/L (ref 134–144)
Total Protein: 6.6 g/dL (ref 6.0–8.5)
eGFR: 69 mL/min/1.73 (ref >59)

## 2024-02-28 LAB — LIPID PANEL W/O CHOL/HDL RATIO
Cholesterol, Total: 144 mg/dL (ref 100–199)
HDL: 51 mg/dL (ref >39)
LDL Chol Calc (NIH): 78 mg/dL (ref 0–99)
Triglycerides: 74 mg/dL (ref 0–149)
VLDL Cholesterol Cal: 15 mg/dL (ref 5–40)

## 2024-02-28 NOTE — Progress Notes (Signed)
 Good morning, please let Lindsey Bautista know her labs have returned.  Kidney and liver function are nice and normal + lipid panel shows levels at goal.  No medication changes needed.  Any questions? Keep being stellar!!  Thank you for allowing me to participate in your care.  I appreciate you. Kindest regards, Aina Rossbach

## 2024-04-02 NOTE — Progress Notes (Deleted)
 Referring Physician:  Valerio Melanie DASEN, NP [address] [place],  [place] [address]  Primary Physician:  Valerio Melanie DASEN, NP  History of Present Illness: 04/02/2024 Ms. Avantika Towery is here today with a chief complaint of ***  02/12/2024 Notes from [place], [place]   Patient coming to clinic today for increased pain x 5 days after removing clothes.  She describes pain severe in her low back and both of her legs.  She feels that she has weakness in her legs at baseline this is continued.  She indicates she is having some trouble bending over or laying on her side.  She recently saw vascular surgery for workup for her leg pain.  No saddle anesthesia or numbness and tingling.     01/11/24 Ms. Earlene Parkey has a history of CAD, emphysema, Barrett's esophagus, depression, hyperlipidemia, anxiety, conversion disorder with mixed symptoms, FM, h/o CVA, COPD, alpha gal.    She has some right sided weakness since her strokes.    She has constant neck pain with bilateral arm pain to both hands x 1 year. She has swelling in her right hand every morning x 6 months. She has weakness, numbness, tingling in both arms. She has issues with hand dexterity and drops things all the time. Feels like her balance is off. She has fallen a few times.    She has constant LBP with bilateral leg pain (entire leg) to her feet, left leg pain > right leg pain. She has weakness and tingling in her legs. She has trouble getting up from squatting position.    No specific alleviating or aggravating factors for her neck or back.    She is taking zanaflex  with some relief.    She smokes 1/2 PPD x more than 40 years.   Bowel/Bladder Dysfunction: none   Conservative measures:  Physical therapy:  has not participated in PT Multimodal medical therapy including regular antiinflammatories: Tizanidine   Injections: no epidural steroid injections   Past Surgery: none  LAYLEE SCHOOLEY has ***no symptoms of cervical  myelopathy.  The symptoms are causing a significant impact on the patient's life.   I have utilized the care everywhere function in epic to review the outside records available from external health systems.   Discussed the use of AI scribe software for clinical note transcription with the patient, who gave verbal consent to proceed.       Review of Systems:  A 10 point review of systems is negative, except for the pertinent positives and negatives detailed in the HPI.  Past Medical History: Past Medical History:  Diagnosis Date   Arthritis    Atypical chest pain    a. 08/2016 Neg MV.   COPD (chronic obstructive pulmonary disease) (HCC)    Coronary artery disease, non-occlusive    a.  LHC 06/19/2018: Mid LAD 50% with iFR 0.94   Depression    Diastolic dysfunction    a. 10/2016 Echo: EF 60-65%, no rwma, Gr1 DD, mild MR, nl LA size, nl RV fxn; b. 05/2018 Echo: EF 60-65%. No rwma. Mild MR. Nl RV fxn.   Fibromyalgia    GERD (gastroesophageal reflux disease)    Hypotension    IBS (irritable bowel syndrome)    Right arm weakness    Situational syncope    Stroke (HCC)    2013, 2018.  Some short term memory issues, some weakness   TIA (transient ischemic attack)    Wears dentures    full upper  Past Surgical History: Past Surgical History:  Procedure Laterality Date   CATARACT EXTRACTION W/PHACO Right 05/04/2022   Procedure: CATARACT EXTRACTION PHACO AND INTRAOCULAR LENS PLACEMENT (IOC) RIGHT 3.89 00:47.4;  Surgeon: Enola Feliciano Hugger, MD;  Location: [facility];  Service: Ophthalmology;  Laterality: Right;   CATARACT EXTRACTION W/PHACO Left 05/18/2022   Procedure: CATARACT EXTRACTION PHACO AND INTRAOCULAR LENS PLACEMENT (IOC) LEFT 1.44 00:19.7;  Surgeon: Enola Feliciano Hugger, MD;  Location: [facility];  Service: Ophthalmology;  Laterality: Left;   COLONOSCOPY WITH PROPOFOL  N/A 11/22/2015   Procedure: COLONOSCOPY WITH PROPOFOL ;  Surgeon: Lamar ONEIDA Holmes,  MD;  Location: [facility] ENDOSCOPY;  Service: Endoscopy;  Laterality: N/A;   CORONARY PRESSURE/FFR STUDY N/A 06/19/2018   Procedure: INTRAVASCULAR PRESSURE WIRE/FFR STUDY;  Surgeon: Mady Bruckner, MD;  Location: [facility] INVASIVE CV LAB;  Service: Cardiovascular;  Laterality: N/A;   ESOPHAGOGASTRODUODENOSCOPY (EGD) WITH PROPOFOL  N/A 11/22/2015   Procedure: ESOPHAGOGASTRODUODENOSCOPY (EGD) WITH PROPOFOL ;  Surgeon: Lamar ONEIDA Holmes, MD;  Location[facility] ENDOSCOPY;  Service: Endoscopy;  Laterality: N/A;   LEFT HEART CATH AND CORONARY ANGIOGRAPHY N/A 06/19/2018   Procedure: LEFT HEART CATH AND CORONARY ANGIOGRAPHY;  Surgeon: Mady Bruckner, MD;  Location: [facility] INVASIVE CV LAB;  Service: Cardiovascular;  Laterality: N/A;   OOPHORECTOMY     TONSILLECTOMY     VAGINAL HYSTERECTOMY      Allergies: Allergies as of 04/08/2024 - Review Complete 02/27/2024  Allergen Reaction Noted   Amoxicillin Anaphylaxis 11/01/2022   Flagyl  [metronidazole ] Anaphylaxis 05/09/2019   Penicillin g Anaphylaxis and Other (See Comments) 12/17/2014   Iodinated contrast media Rash and Nausea And Vomiting 04/28/2014   Alpha-gal  02/21/2022   Flomax  [tamsulosin  hcl] Hives 03/12/2019   Methylprednisolone  Other (See Comments) 02/27/2024    Medications:  Current Outpatient Medications:    atorvastatin  (LIPITOR) 80 MG tablet, Take 1 tablet (80 mg total) by mouth daily., Disp: 90 tablet, Rfl: 4   clonazePAM  (KLONOPIN ) 1 MG tablet, Take 1 tablet (1 mg total) by mouth 2 (two) times daily., Disp: 60 tablet, Rfl: 2   dimenhyDRINATE (DRAMAMINE) 50 MG tablet, Take 50 mg by mouth every 8 (eight) hours as needed., Disp: , Rfl:    docusate sodium  (COLACE) 100 MG capsule, Take 100 mg by mouth daily. , Disp: , Rfl:    EPINEPHRINE  0.3 mg/0.3 mL IJ SOAJ injection, INJECT CONTENTS OF 1 PEN INTO THE MUSCLE AS NEEDED FOR ALLERGIC REACTION, Disp: 2 each, Rfl: 0   ezetimibe  (ZETIA ) 10 MG tablet, Take 1 tablet (10 mg total) by mouth daily., Disp: 90  tablet, Rfl: 4   FLUoxetine  (PROZAC ) 40 MG capsule, Take 1 capsule (40 mg total) by mouth daily., Disp: 90 capsule, Rfl: 4   meclizine  (ANTIVERT ) 25 MG tablet, Take 1 tablet (25 mg total) by mouth 3 (three) times daily as needed., Disp: 15 tablet, Rfl: 0   nitroGLYCERIN  (NITROSTAT ) 0.4 MG SL tablet, DISSOLVE ONE TABLET UNDER THE TONGUE EVERY 5 MINUTES AS NEEDED FOR CHEST PAIN.  DO NOT EXCEED A TOTAL OF 3 DOSES IN 15 MINUTES, Disp: 50 tablet, Rfl: 0   tiZANidine  (ZANAFLEX ) 2 MG tablet, Take 1 tablet (2 mg total) by mouth every 8 (eight) hours as needed for muscle spasms., Disp: 90 tablet, Rfl: 1  Social History: Social History   Tobacco Use   Smoking status: Every Day    Current packs/day: 0.50    Average packs/day: 0.5 packs/day for 46.6 years (23.3 ttl pk-yrs)    Types: Cigarettes    Start date:  1979   Smokeless tobacco: Never   Tobacco comments:    Started smoking age 20    Smoking 1/2 PPD; 05/22/2023.  Vaping Use   Vaping status: Never Used  Substance Use Topics   Alcohol use: No    Alcohol/week: 0.0 standard drinks of alcohol   Drug use: No    Family Medical History: Family History  Problem Relation Age of Onset   Bladder Cancer Mother 61   Heart failure Mother    Renal Disease Mother    COPD Father    Bladder Cancer Maternal Aunt    Bladder Cancer Maternal Aunt    Breast cancer Maternal Aunt    Breast cancer Paternal Aunt 20   Colon cancer Maternal Grandmother    Cancer Paternal Grandfather        unk primary, metastatic, d 55s   Heart disease Other    COPD Other    Stroke Other    Prostate cancer Cousin        metastatic   Prostate cancer Cousin     Physical Examination: There were no vitals filed for this visit.  General: Patient is in no apparent distress. Attention to examination is appropriate.  Neck:   Supple.  Full range of motion.  Respiratory: Patient is breathing without any difficulty.   NEUROLOGICAL:     Awake, alert, oriented to person,  place, and time.  Speech is clear and fluent.   Cranial Nerves: Pupils equal round and reactive to light.  Facial tone is symmetric.  Facial sensation is symmetric. Shoulder shrug is symmetric. Tongue protrusion is midline.  There is no pronator drift.  Strength: Side Biceps Triceps Deltoid Interossei Grip Wrist Ext. Wrist Flex.  R 5 5 5 5 5 5 5   L 5 5 5 5 5 5 5    Side Iliopsoas Quads Hamstring PF DF EHL  R 5 5 5 5 5 5   L 5 5 5 5 5 5    Reflexes are ***2+ and symmetric at the biceps, triceps, brachioradialis, patella and achilles.   Hoffman's is absent.   Bilateral upper and lower extremity sensation is intact to light touch.    No evidence of dysmetria noted.  Gait is normal.     Medical Decision Making  Imaging: ***  I have personally reviewed the images and agree with the above interpretation.  Assessment and Plan: Ms. Roosevelt is a pleasant 63 y.o. female with ***      Thank you for involving me in the care of this patient.      Chester K. Clois MD, [facility] Neurosurgery

## 2024-04-08 ENCOUNTER — Ambulatory Visit: Admitting: Neurosurgery

## 2024-05-24 ENCOUNTER — Other Ambulatory Visit: Payer: Self-pay | Admitting: Nurse Practitioner

## 2024-05-24 DIAGNOSIS — F419 Anxiety disorder, unspecified: Secondary | ICD-10-CM

## 2024-05-26 ENCOUNTER — Other Ambulatory Visit: Payer: Self-pay | Admitting: Nurse Practitioner

## 2024-05-26 DIAGNOSIS — F419 Anxiety disorder, unspecified: Secondary | ICD-10-CM

## 2024-05-26 NOTE — Telephone Encounter (Unsigned)
 Copied from CRM [id number]. Topic: Clinical - Medication Refill >> May 26, 2024  9:57 AM Roselie BROCKS wrote: Medication: clonazePAM  (KLONOPIN ) 1 MG tablet  Has the patient contacted their pharmacy? Yes (Agent: If no, request that the patient contact the pharmacy for the refill. If patient does not wish to contact the pharmacy document the reason why and proceed with request.) (Agent: If yes, when and what did the pharmacy advise?)  This is the patient's preferred pharmacy:  [facility] [address]), [place] - [address] [place]E OTHEL JACOBS [place]) [place] [address] Phone: [phone] Fax: [phone]  Is this the correct pharmacy for this prescription? Yes If no, delete pharmacy and type the correct one.   Has the prescription been filled recently? Yes  Is the patient out of the medication? Yes  Has the patient been seen for an appointment in the last year OR does the patient have an upcoming appointment? Yes  Can we respond through MyChart? No  Agent: Please be advised that Rx refills may take up to 3 business days. We ask that you follow-up with your pharmacy.

## 2024-05-27 NOTE — Telephone Encounter (Signed)
 Duplicate request, refilled 05/27/24.  Requested Prescriptions  Pending Prescriptions Disp Refills   clonazePAM  (KLONOPIN ) 1 MG tablet 60 tablet 2    Sig: Take 1 tablet (1 mg total) by mouth 2 (two) times daily.     Not Delegated - Psychiatry: Anxiolytics/Hypnotics 2 Failed - 05/27/2024  3:33 PM      Failed - This refill cannot be delegated      Passed - Urine Drug Screen completed in last 360 days      Passed - Patient is not pregnant      Passed - Valid encounter within last 6 months    Recent Outpatient Visits           3 months ago Depression, recurrent   [place] [facility] [place], [place] T, NP   6 months ago Aneurysm of splenic artery   [place] [facility] [place], Melanie DASEN, NP

## 2024-05-27 NOTE — Telephone Encounter (Signed)
 Requested medication (s) are due for refill today: yes  Requested medication (s) are on the active medication list: yes  Last refill:  02/27/24 #60 2 RF  Future visit scheduled: yes  Notes to clinic:  med not delegated to NT to RF   Requested Prescriptions  Pending Prescriptions Disp Refills   clonazePAM  (KLONOPIN ) 1 MG tablet [Pharmacy Med Name: clonazePAM  1 MG Oral Tablet] 60 tablet 0    Sig: Take 1 tablet by mouth twice daily     Not Delegated - Psychiatry: Anxiolytics/Hypnotics 2 Failed - 05/27/2024  8:03 AM      Failed - This refill cannot be delegated      Passed - Urine Drug Screen completed in last 360 days      Passed - Patient is not pregnant      Passed - Valid encounter within last 6 months    Recent Outpatient Visits           3 months ago Depression, recurrent   [place] [facility] [place], [place] T, NP   6 months ago Aneurysm of splenic artery   [place] [facility] [place], Melanie DASEN, NP

## 2024-06-01 NOTE — Patient Instructions (Signed)
 Managing Anxiety, Adult  After being diagnosed with anxiety, you may be relieved to know why you have felt or behaved a certain way. You may also feel overwhelmed about the treatment ahead and what it will mean for your life. With care and support, you can manage your anxiety.  How to manage lifestyle changes  Understanding the difference between stress and anxiety  Although stress can play a role in anxiety, it is not the same as anxiety. Stress is your body's reaction to life changes and events, both good and bad. Stress is often caused by something external, such as a deadline, test, or competition. It normally goes away after the event has ended and will last just a few hours. But, stress can be ongoing and can lead to more than just stress.  Anxiety is caused by something internal, such as imagining a terrible outcome or worrying that something will go wrong that will greatly upset you. Anxiety often does not go away even after the event is over, and it can become a long-term (chronic) worry.  Lowering stress and anxiety    Talk with your health care provider or a counselor to learn more about lowering anxiety and stress. They may suggest tension-reduction techniques, such as:  Music. Spend time creating or listening to music that you enjoy and that inspires you.  Mindfulness-based meditation. Practice being aware of your normal breaths while not trying to control your breathing. It can be done while sitting or walking.  Centering prayer. Focus on a word, phrase, or sacred image that means something to you and brings you peace.  Deep breathing. Expand your stomach and inhale slowly through your nose. Hold your breath for 3-5 seconds. Then breathe out slowly, letting your stomach muscles relax.  Self-talk. Learn to notice and spot thought patterns that lead to anxiety reactions. Change those patterns to thoughts that feel peaceful.  Muscle relaxation. Take time to tense muscles and then relax them.  Choose a  tension-reduction technique that fits your lifestyle and personality. These techniques take time and practice. Set aside 5-15 minutes a day to do them. Specialized therapists can offer counseling and training in these techniques. The training to help with anxiety may be covered by some insurance plans.  Other things you can do to manage stress and anxiety include:  Keeping a stress diary. This can help you learn what triggers your reaction and then learn ways to manage your response.  Thinking about how you react to certain situations. You may not be able to control everything, but you can control your response.  Making time for activities that help you relax and not feeling guilty about spending your time in this way.  Doing visual imagery. This involves imagining or creating mental pictures to help you relax.  Practicing yoga. Through yoga poses, you can lower tension and relax.     Medicines  Medicines for anxiety include:  Antidepressant medicines. These are usually prescribed for long-term daily control.  Anti-anxiety medicines. These may be added in severe cases, especially when panic attacks occur.  When used together, medicines, psychotherapy, and tension-reduction techniques may be the most effective treatment.  Relationships  Relationships can play a big part in helping you recover. Spend more time connecting with trusted friends and family members. Think about going to couples counseling if you have a partner, taking family education classes, or going to family therapy. Therapy can help you and others better understand your anxiety.  How to recognize changes in  your anxiety  Everyone responds differently to treatment for anxiety. Recovery from anxiety happens when symptoms lessen and stop interfering with your daily life at home or work. This may mean that you will start to:  Have better concentration and focus. Worry will interfere less in your daily thinking.  Sleep better.  Be less irritable.  Have  more energy.  Have improved memory.  Try to recognize when your condition is getting worse. Contact your provider if your symptoms interfere with home or work and you feel like your condition is not improving.  Follow these instructions at home:  Activity  Exercise. Adults should:  Exercise for at least 150 minutes each week. The exercise should increase your heart rate and make you sweat (moderate-intensity exercise).  Do strengthening exercises at least twice a week.  Get the right amount and quality of sleep. Most adults need 7-9 hours of sleep each night.  Lifestyle    Eat a healthy diet that includes plenty of vegetables, fruits, whole grains, low-fat dairy products, and lean protein.  Do not eat a lot of foods that are high in fats, added sugars, or salt (sodium).  Make choices that simplify your life.  Do not use any products that contain nicotine or tobacco. These products include cigarettes, chewing tobacco, and vaping devices, such as e-cigarettes. If you need help quitting, ask your provider.  Avoid caffeine, alcohol, and certain over-the-counter cold medicines. These may make you feel worse. Ask your pharmacist which medicines to avoid.  General instructions  Take over-the-counter and prescription medicines only as told by your provider.  Keep all follow-up visits. This is to make sure you are managing your anxiety well or if you need more support.  Where to find support  You can get help and support from:  Self-help groups.  Online and [employer].  A trusted spiritual leader.  Couples counseling.  Family education classes.  Family therapy.  Where to find more information  You may find that joining a support group helps you deal with your anxiety. The following sources can help you find counselors or support groups near you:  Mental Health [place]: mentalhealthamerica.net  Anxiety and Depression Association of [place] (ADAA): adaa.org  [employer] Mental Illness (NAMI):  nami.org  Contact a health care provider if:  You have a hard time staying focused or finishing tasks.  You spend many hours a day feeling worried about everyday life.  You are very tired because you cannot stop worrying.  You start to have headaches or often feel tense.  You have chronic nausea or diarrhea.  Get help right away if:  Your heart feels like it is racing.  You have shortness of breath.  You have thoughts of hurting yourself or others.  Get help right away if you feel like you may hurt yourself or others, or have thoughts about taking your own life. Go to your nearest emergency room or:  Call 911.  Call the National Suicide Prevention Lifeline at (562)309-0957 or 988. This is open 24 hours a day.  Text the Crisis Text Line at 786-142-3811.  This information is not intended to replace advice given to you by your health care provider. Make sure you discuss any questions you have with your health care provider.  Document Revised: 05/16/2022 Document Reviewed: 11/28/2020  Elsevier Patient Education  2024 [employer].

## 2024-06-04 ENCOUNTER — Encounter: Payer: Self-pay | Admitting: Nurse Practitioner

## 2024-06-04 ENCOUNTER — Ambulatory Visit (INDEPENDENT_AMBULATORY_CARE_PROVIDER_SITE_OTHER): Admitting: Nurse Practitioner

## 2024-06-04 VITALS — BP 126/80 | HR 56 | Temp 98.4°F | Resp 14 | Ht 60.98 in | Wt 151.2 lb

## 2024-06-04 DIAGNOSIS — F419 Anxiety disorder, unspecified: Secondary | ICD-10-CM

## 2024-06-04 DIAGNOSIS — F17210 Nicotine dependence, cigarettes, uncomplicated: Secondary | ICD-10-CM

## 2024-06-04 DIAGNOSIS — F1320 Sedative, hypnotic or anxiolytic dependence, uncomplicated: Secondary | ICD-10-CM | POA: Diagnosis not present

## 2024-06-04 DIAGNOSIS — R350 Frequency of micturition: Secondary | ICD-10-CM | POA: Insufficient documentation

## 2024-06-04 DIAGNOSIS — I2510 Atherosclerotic heart disease of native coronary artery without angina pectoris: Secondary | ICD-10-CM

## 2024-06-04 DIAGNOSIS — F339 Major depressive disorder, recurrent, unspecified: Secondary | ICD-10-CM | POA: Diagnosis not present

## 2024-06-04 LAB — URINALYSIS, ROUTINE W REFLEX MICROSCOPIC
Bilirubin, UA: NEGATIVE
Glucose, UA: NEGATIVE
Ketones, UA: NEGATIVE
Leukocytes,UA: NEGATIVE
Nitrite, UA: NEGATIVE
Protein,UA: NEGATIVE
RBC, UA: NEGATIVE
Specific Gravity, UA: 1.015 (ref 1.005–1.030)
Urobilinogen, Ur: 0.2 mg/dL (ref 0.2–1.0)
pH, UA: 8.5 — ABNORMAL HIGH (ref 5.0–7.5)

## 2024-06-04 MED ORDER — NITROGLYCERIN 0.4 MG SL SUBL: 0.4000 mg | SUBLINGUAL_TABLET | SUBLINGUAL | 0 refills | Status: AC | PRN

## 2024-06-04 MED ORDER — ESTRADIOL 0.01 % VA CREA: 1.0000 | TOPICAL_CREAM | VAGINAL | 3 refills | Status: AC

## 2024-06-04 MED ORDER — EPINEPHRINE 0.3 MG/0.3ML IJ SOAJ: 0.3000 mg | INTRAMUSCULAR | 2 refills | Status: AC | PRN

## 2024-06-04 NOTE — Assessment & Plan Note (Signed)
 Chronic, ongoing.  Continue Klonopin at this time, patient educated on risks of medication and is not interested in reducing -- refills sent in.  Discussed we would not increase this, unless she saw psychiatry.  Continue Prozac 40 MG which benefits anxiety and fibromyalgia, discussed with her changing to Zoloft or Duloxetine (which would benefit chronic pain issues) if increase anxiety/depression noted -- refuses.  At this time she denies SI/HI and wishes to maintain current regimen.  UDS yearly (next 08/30/24) and contract next visit.  Return in 3 months.

## 2024-06-04 NOTE — Assessment & Plan Note (Signed)
 Refer to anxiety plan of care.

## 2024-06-04 NOTE — Progress Notes (Signed)
 BP 126/80 (BP Location: Left Arm, Patient Position: Sitting, Cuff Size: Normal)   Pulse (!) 56   Temp 98.4 F (36.9 C) (Oral)   Resp 14   Ht 5' 0.98 (1.549 m)   Wt 151 lb 3.2 oz (68.6 kg)   SpO2 97%   BMI 28.58 kg/m    Subjective:    Patient ID: Lindsey Bautista, female    DOB: 09-21-1960, 63 y.o.   MRN: [id number]  HPI: Lindsey Bautista is a 63 y.o. female  Chief Complaint  Patient presents with   Urinary Incontinence    Over the past few months but worse over the last few weeks, over night accidents, frequency. Also mentions that the first time using restroom after each shower has a lot of burning.    Depression   Anxiety   URINARY SYMPTOMS Reports urinary incontinence issues over the last few weeks and occasional periods of feeling hot.  Has had some overnight accidents and frequency. Saw urology last in June 2024. Last saw urology on 01/23/23. Used vaginal estrace  at one time, but currently not on list, she is unsure why. Dysuria: burning after shower on occasion Urinary frequency: yes Urgency: yes Small volume voids: no Symptom severity: yes Urinary incontinence: yes Foul odor: no Hematuria: no Abdominal pain: no Back pain: no Suprapubic pain/pressure: no Flank pain: no Fever:  no Vomiting: no Status: stable Previous urinary tract infection: yes Recurrent urinary tract infection: no Sexual activity: No sexually active History of sexually transmitted disease: no Treatments attempted: increasing fluids    DEPRESSION/ANXIETY  Continues Prozac  40 MG daily and Klonopin  1 MG BID. Pt is aware of risks of benzo medication use to include increased sedation, respiratory suppression, falls, dependence and cardiovascular events.  Pt would like to continue treatment as benefit determined to outweigh risk -- has been on this for many years.  PDMP last Klonopin  fill 05/27/24.  No other controlled substances noted. She refuses psychiatry -- states she saw them in past and does not  want to return.   Continues to smoke daily, 1/2 pack or more daily. Mood status: stable Satisfied with current treatment?: yes Symptom severity: mild  Duration of current treatment : chronic Side effects: no Medication compliance: good compliance Psychotherapy/counseling: none Previous psychiatric medications: Prozac  and Klonopin  Depressed mood: yes at times Anxious mood: yes at times, no panic attacks Anhedonia: no Significant weight loss or gain: no Insomnia: varies Fatigue: occasional Feelings of worthlessness or guilt: none Impaired concentration/indecisiveness: no Suicidal ideations: no Hopelessness: no Crying spells: no    06/04/2024    1:29 PM 02/27/2024    1:14 PM 11/01/2023    9:38 AM 08/31/2023    1:12 PM 05/30/2023    1:17 PM  Depression screen PHQ 2/9  Decreased Interest 1 1 1 1 2   Down, Depressed, Hopeless 2 2 1 2 2   PHQ - 2 Score 3 3 2 3 4   Altered sleeping 1 0 1 1 2   Tired, decreased energy 3 3 1 1 3   Change in appetite 1 1 1 2 2   Feeling bad or failure about yourself  2 2 1 2 2   Trouble concentrating 2 1 1 1 2   Moving slowly or fidgety/restless 1 1 0 2 0  Suicidal thoughts 0 0 0 0 0  PHQ-9 Score 13 11 7 12 15   Difficult doing work/chores Somewhat difficult Somewhat difficult Somewhat difficult Very difficult Very difficult      06/04/2024    1:30 PM 02/27/2024  1:14 PM 08/31/2023    1:12 PM 05/30/2023    1:17 PM  GAD 7 : Generalized Anxiety Score  Nervous, Anxious, on Edge 2 2 2 3   Control/stop worrying 3 2 2 3   Worry too much - different things 3 3 2 3   Trouble relaxing 1 2 2 1   Restless 1 1 1 2   Easily annoyed or irritable 2 2 2 2   Afraid - awful might happen 2 2 2 2   Total GAD 7 Score 14 14 13 16   Anxiety Difficulty Somewhat difficult Somewhat difficult Very difficult Very difficult   Relevant past medical, surgical, family and social history reviewed and updated as indicated. Interim medical history since our last visit reviewed. Allergies and  medications reviewed and updated.  Review of Systems  Constitutional:  Negative for activity change, appetite change, diaphoresis, fatigue and fever.  Respiratory:  Negative for cough, chest tightness, shortness of breath and wheezing.   Cardiovascular:  Negative for chest pain, palpitations and leg swelling.  Gastrointestinal: Negative.   Endocrine: Negative for cold intolerance and heat intolerance.  Genitourinary:  Positive for dysuria, frequency and urgency. Negative for decreased urine volume, hematuria and pelvic pain.  Neurological: Negative.   Psychiatric/Behavioral:  Negative for decreased concentration, self-injury, sleep disturbance and suicidal ideas. The patient is nervous/anxious.    Per HPI unless specifically indicated above     Objective:    BP 126/80 (BP Location: Left Arm, Patient Position: Sitting, Cuff Size: Normal)   Pulse (!) 56   Temp 98.4 F (36.9 C) (Oral)   Resp 14   Ht 5' 0.98 (1.549 m)   Wt 151 lb 3.2 oz (68.6 kg)   SpO2 97%   BMI 28.58 kg/m   Wt Readings from Last 3 Encounters:  06/04/24 151 lb 3.2 oz (68.6 kg)  02/27/24 148 lb (67.1 kg)  02/14/24 145 lb 8 oz (66 kg)    Physical Exam Vitals and nursing note reviewed.  Constitutional:      General: She is awake. She is not in acute distress.    Appearance: She is well-developed, well-groomed and overweight. She is not ill-appearing or toxic-appearing.  HENT:     Head: Normocephalic.     Right Ear: Hearing and external ear normal.     Left Ear: Hearing and external ear normal.  Eyes:     General: Lids are normal.        Right eye: No discharge.        Left eye: No discharge.     Extraocular Movements: Extraocular movements intact.     Conjunctiva/sclera: Conjunctivae normal.     Pupils: Pupils are equal, round, and reactive to light.     Visual Fields: Right eye visual fields normal and left eye visual fields normal.  Neck:     Thyroid : No thyromegaly.     Vascular: No carotid bruit.   Cardiovascular:     Rate and Rhythm: Normal rate and regular rhythm.     Heart sounds: Normal heart sounds. No murmur heard.    No gallop.  Pulmonary:     Effort: Pulmonary effort is normal. No accessory muscle usage or respiratory distress.     Breath sounds: Normal breath sounds. No decreased breath sounds or wheezing.  Chest:  Breasts:    Right: Normal.     Left: Normal.  Abdominal:     General: Bowel sounds are normal.     Palpations: Abdomen is soft.     Tenderness: There is no  right CVA tenderness, left CVA tenderness, guarding or rebound.     Hernia: No hernia is present.  Musculoskeletal:     Cervical back: Normal range of motion and neck supple.     Right lower leg: No edema.     Left lower leg: No edema.  Lymphadenopathy:     Cervical: No cervical adenopathy.     Upper Body:     Right upper body: No supraclavicular, axillary or pectoral adenopathy.     Left upper body: No supraclavicular, axillary or pectoral adenopathy.  Skin:    General: Skin is warm and dry.  Neurological:     Mental Status: She is alert and oriented to person, place, and time.     Cranial Nerves: Cranial nerves 2-12 are intact.     Sensory: Sensation is intact.     Motor: Motor function is intact.     Coordination: Coordination is intact.     Gait: Gait is intact.     Deep Tendon Reflexes: Reflexes are normal and symmetric.     Reflex Scores:      Brachioradialis reflexes are 2+ on the right side and 2+ on the left side.      Patellar reflexes are 2+ on the right side and 2+ on the left side. Psychiatric:        Attention and Perception: Attention normal.        Mood and Affect: Mood normal.        Speech: Speech normal.        Behavior: Behavior normal. Behavior is cooperative.        Thought Content: Thought content normal.    Results for orders placed or performed in visit on 02/27/24  Comprehensive metabolic panel with GFR   Collection Time: 02/27/24  1:32 PM  Result Value Ref  Range   Glucose 74 70 - 99 mg/dL   BUN 6 (L) 8 - 27 mg/dL   Creatinine, Ser 9.06 0.57 - 1.00 mg/dL   eGFR 69 >40 fO/fpw/8.26   BUN/Creatinine Ratio 6 (L) 12 - 28   Sodium 142 134 - 144 mmol/L   Potassium 4.3 3.5 - 5.2 mmol/L   Chloride 104 96 - 106 mmol/L   CO2 23 20 - 29 mmol/L   Calcium  9.7 8.7 - 10.3 mg/dL   Total Protein 6.6 6.0 - 8.5 g/dL   Albumin 4.3 3.9 - 4.9 g/dL   Globulin, Total 2.3 1.5 - 4.5 g/dL   Bilirubin Total 0.7 0.0 - 1.2 mg/dL   Alkaline Phosphatase 94 44 - 121 IU/L   AST 22 0 - 40 IU/L   ALT 17 0 - 32 IU/L  Lipid Panel w/o Chol/HDL Ratio   Collection Time: 02/27/24  1:32 PM  Result Value Ref Range   Cholesterol, Total 144 100 - 199 mg/dL   Triglycerides 74 0 - 149 mg/dL   HDL 51 >60 mg/dL   VLDL Cholesterol Cal 15 5 - 40 mg/dL   LDL Chol Calc (NIH) 78 0 - 99 mg/dL      Assessment & Plan:   Problem List Items Addressed This Visit       Cardiovascular and Mediastinum   Coronary artery disease, non-occlusive   Relevant Medications   EPINEPHrine  0.3 mg/0.3 mL IJ SOAJ injection   nitroGLYCERIN  (NITROSTAT ) 0.4 MG SL tablet     Other   Urinary frequency   UA overall negative today, discussed with patient. Recommend restart vaginal estrace  to help with prevention of symptoms +  provided her with print out on how to perform pelvic floor therapy as suspect some OAB present.      Relevant Orders   Urinalysis, Routine w reflex microscopic   Nicotine  dependence, cigarettes, uncomplicated   I have recommended complete cessation of tobacco use. I have discussed various options available for assistance with tobacco cessation including over the counter methods (Nicotine  gum, patch and lozenges). We also discussed prescription options (Chantix, Nicotine  Inhaler / Nasal Spray). The patient is not interested in pursuing any prescription tobacco cessation options at this time.       Depression, recurrent   Chronic, ongoing.  Continue current medication regimen and  adjust as needed.  Denies SI/HI.  Consider change from Prozac  to Cymbalta in future, which would offer benefit with anxiety and depression + fibromyalgia/alpha gal -- she refuses this change today.  Very anxious about changes.  Referral to therapy placed in past, she never attended.  Continues to be concerned about health.      Benzodiazepine dependence, continuous (HCC) - Primary   Refer to anxiety plan for updates and discussion. UDS obtained due next 08/30/24.  Contract next visit.      Anxiety   Chronic, ongoing.  Continue Klonopin  at this time, patient educated on risks of medication and is not interested in reducing -- refills sent in.  Discussed we would not increase this, unless she saw psychiatry.  Continue Prozac  40 MG which benefits anxiety and fibromyalgia, discussed with her changing to Zoloft or Duloxetine (which would benefit chronic pain issues) if increase anxiety/depression noted -- refuses.  At this time she denies SI/HI and wishes to maintain current regimen.  UDS yearly (next 08/30/24) and contract next visit.  Return in 3 months.         Follow up plan: Return in about 3 months (around 09/04/2024) for HTN/HLD, ANXIETY, COPD.

## 2024-06-04 NOTE — Assessment & Plan Note (Signed)
 Chronic, ongoing.  Continue current medication regimen and adjust as needed.  Denies SI/HI.  Consider change from Prozac to Cymbalta in future, which would offer benefit with anxiety and depression + fibromyalgia/alpha gal -- she refuses this change today.  Very anxious about changes.  Referral to therapy placed in past, she never attended.  Continues to be concerned about health.

## 2024-06-04 NOTE — Assessment & Plan Note (Signed)
 Refer to anxiety plan for updates and discussion. UDS obtained due next 08/30/24.  Contract next visit.

## 2024-06-04 NOTE — Assessment & Plan Note (Signed)
 I have recommended complete cessation of tobacco use. I have discussed various options available for assistance with tobacco cessation including over the counter methods (Nicotine gum, patch and lozenges). We also discussed prescription options (Chantix, Nicotine Inhaler / Nasal Spray). The patient is not interested in pursuing any prescription tobacco cessation options at this time.

## 2024-06-04 NOTE — Assessment & Plan Note (Signed)
 UA overall negative today, discussed with patient. Recommend restart vaginal estrace  to help with prevention of symptoms + provided her with print out on how to perform pelvic floor therapy as suspect some OAB present.

## 2024-07-30 ENCOUNTER — Other Ambulatory Visit: Payer: Self-pay | Admitting: Nurse Practitioner

## 2024-07-30 DIAGNOSIS — F419 Anxiety disorder, unspecified: Secondary | ICD-10-CM

## 2024-08-01 NOTE — Telephone Encounter (Signed)
 Requested medication (s) are due for refill today: yes  Requested medication (s) are on the active medication list: yes  Last refill:  05/2024  Future visit scheduled: yes  Notes to clinic:  Unable to refill per protocol, cannot delegate.      Requested Prescriptions  Pending Prescriptions Disp Refills   clonazePAM  (KLONOPIN ) 1 MG tablet [Pharmacy Med Name: clonazePAM  1 MG Oral Tablet] 60 tablet 0    Sig: Take 1 tablet by mouth twice daily     Not Delegated - Psychiatry: Anxiolytics/Hypnotics 2 Failed - 08/01/2024  1:59 PM      Failed - This refill cannot be delegated      Passed - Urine Drug Screen completed in last 360 days      Passed - Patient is not pregnant      Passed - Valid encounter within last 6 months    Recent Outpatient Visits           1 month ago Benzodiazepine dependence, continuous (HCC)   [place] [facility] [place], [place] T, NP   5 months ago Depression, recurrent   [place] [facility] [place], [place] T, NP   8 months ago Aneurysm of splenic artery   [place] [facility] [place], Melanie DASEN, NP

## 2024-08-22 ENCOUNTER — Telehealth: Payer: Self-pay

## 2024-08-22 NOTE — Telephone Encounter (Signed)
 Copied from CRM #[id number]. Topic: Appointments - Appointment Info/Confirmation >> Aug 20, 2024  9:36 AM Nathanel BROCKS wrote: Patient/patient representative is calling for information regarding an appointment.    Can not get appt for pt today and she has a cough, congestion and not feeling well. Can you give her a call at your convience.  ----------------------------------------------------------------------- From previous Reason for Contact - Scheduling: Patient/patient representative is calling to schedule an appointment. Refer to attachments for appointment information.

## 2024-08-26 ENCOUNTER — Other Ambulatory Visit: Payer: Self-pay | Admitting: Nurse Practitioner

## 2024-08-26 DIAGNOSIS — F419 Anxiety disorder, unspecified: Secondary | ICD-10-CM

## 2024-08-26 NOTE — Telephone Encounter (Signed)
 Copied from CRM [id number]. Topic: Clinical - Medication Refill >> Aug 26, 2024 11:55 AM Jasmin G wrote: Medication: clonazePAM  (KLONOPIN ) 1 MG tablet  Has the patient contacted their pharmacy? No (Agent: If no, request that the patient contact the pharmacy for the refill. If patient does not wish to contact the pharmacy document the reason why and proceed with request.) (Agent: If yes, when and what did the pharmacy advise?)  This is the patient's preferred pharmacy:  [facility] [address]), [place] - [address] [place] [address]THE[place]AC[place]BS [place]) [place] [address] Phone: [phone] Fax: [phone]  Is this the correct pharmacy for this prescription? Yes If no, delete pharmacy and type the correct one.   Has the prescription been filled recently? Yes  Is the patient out of the medication? No  Has the patient been seen for an appointment in the last year OR does the patient have an upcoming appointment? Yes  Can we respond through MyChart? No  Agent: Please be advised that Rx refills may take up to 3 business days. We ask that you follow-up with your pharmacy.

## 2024-08-27 ENCOUNTER — Other Ambulatory Visit: Payer: Self-pay | Admitting: Nurse Practitioner

## 2024-08-27 DIAGNOSIS — F419 Anxiety disorder, unspecified: Secondary | ICD-10-CM

## 2024-08-27 MED ORDER — CLONAZEPAM 1 MG PO TABS: 1.0000 mg | ORAL_TABLET | Freq: Two times a day (BID) | ORAL | 2 refills | Status: DC

## 2024-08-28 ENCOUNTER — Other Ambulatory Visit: Payer: Self-pay | Admitting: Nurse Practitioner

## 2024-08-28 DIAGNOSIS — Z1231 Encounter for screening mammogram for malignant neoplasm of breast: Secondary | ICD-10-CM

## 2024-08-28 NOTE — Telephone Encounter (Signed)
 Requested medication (s) are due for refill today: no  Requested medication (s) are on the active medication list: yes  Last refill:  08/28/23  Future visit scheduled: yes  Notes to clinic:  Unable to refill per protocol, cannot delegate. Unable to refuse, duplicate request.      Requested Prescriptions  Pending Prescriptions Disp Refills   clonazePAM  (KLONOPIN ) 1 MG tablet 60 tablet 2    Sig: Take 1 tablet (1 mg total) by mouth 2 (two) times daily.     Not Delegated - Psychiatry: Anxiolytics/Hypnotics 2 Failed - 08/28/2024  9:19 AM      Failed - This refill cannot be delegated      Failed - Urine Drug Screen completed in last 360 days      Passed - Patient is not pregnant      Passed - Valid encounter within last 6 months    Recent Outpatient Visits           2 months ago Benzodiazepine dependence, continuous (HCC)   [place] [facility] [place], [place] T, NP   6 months ago Depression, recurrent   [place] [facility] [place], [place] T, NP   9 months ago Aneurysm of splenic artery   [facility] [place], Melanie DASEN, NP       Future Appointments             In 2 months Gollan, Timothy J, MD [facility]

## 2024-09-06 NOTE — Patient Instructions (Signed)
 Managing Anxiety, Adult  After being diagnosed with anxiety, you may be relieved to know why you have felt or behaved a certain way. You may also feel overwhelmed about the treatment ahead and what it will mean for your life. With care and support, you can manage your anxiety.  How to manage lifestyle changes  Understanding the difference between stress and anxiety  Although stress can play a role in anxiety, it is not the same as anxiety. Stress is your body's reaction to life changes and events, both good and bad. Stress is often caused by something external, such as a deadline, test, or competition. It normally goes away after the event has ended and will last just a few hours. But, stress can be ongoing and can lead to more than just stress.  Anxiety is caused by something internal, such as imagining a terrible outcome or worrying that something will go wrong that will greatly upset you. Anxiety often does not go away even after the event is over, and it can become a long-term (chronic) worry.  Lowering stress and anxiety    Talk with your health care provider or a counselor to learn more about lowering anxiety and stress. They may suggest tension-reduction techniques, such as:  Music. Spend time creating or listening to music that you enjoy and that inspires you.  Mindfulness-based meditation. Practice being aware of your normal breaths while not trying to control your breathing. It can be done while sitting or walking.  Centering prayer. Focus on a word, phrase, or sacred image that means something to you and brings you peace.  Deep breathing. Expand your stomach and inhale slowly through your nose. Hold your breath for 3-5 seconds. Then breathe out slowly, letting your stomach muscles relax.  Self-talk. Learn to notice and spot thought patterns that lead to anxiety reactions. Change those patterns to thoughts that feel peaceful.  Muscle relaxation. Take time to tense muscles and then relax them.  Choose a  tension-reduction technique that fits your lifestyle and personality. These techniques take time and practice. Set aside 5-15 minutes a day to do them. Specialized therapists can offer counseling and training in these techniques. The training to help with anxiety may be covered by some insurance plans.  Other things you can do to manage stress and anxiety include:  Keeping a stress diary. This can help you learn what triggers your reaction and then learn ways to manage your response.  Thinking about how you react to certain situations. You may not be able to control everything, but you can control your response.  Making time for activities that help you relax and not feeling guilty about spending your time in this way.  Doing visual imagery. This involves imagining or creating mental pictures to help you relax.  Practicing yoga. Through yoga poses, you can lower tension and relax.     Medicines  Medicines for anxiety include:  Antidepressant medicines. These are usually prescribed for long-term daily control.  Anti-anxiety medicines. These may be added in severe cases, especially when panic attacks occur.  When used together, medicines, psychotherapy, and tension-reduction techniques may be the most effective treatment.  Relationships  Relationships can play a big part in helping you recover. Spend more time connecting with trusted friends and family members. Think about going to couples counseling if you have a partner, taking family education classes, or going to family therapy. Therapy can help you and others better understand your anxiety.  How to recognize changes in  your anxiety  Everyone responds differently to treatment for anxiety. Recovery from anxiety happens when symptoms lessen and stop interfering with your daily life at home or work. This may mean that you will start to:  Have better concentration and focus. Worry will interfere less in your daily thinking.  Sleep better.  Be less irritable.  Have  more energy.  Have improved memory.  Try to recognize when your condition is getting worse. Contact your provider if your symptoms interfere with home or work and you feel like your condition is not improving.  Follow these instructions at home:  Activity  Exercise. Adults should:  Exercise for at least 150 minutes each week. The exercise should increase your heart rate and make you sweat (moderate-intensity exercise).  Do strengthening exercises at least twice a week.  Get the right amount and quality of sleep. Most adults need 7-9 hours of sleep each night.  Lifestyle    Eat a healthy diet that includes plenty of vegetables, fruits, whole grains, low-fat dairy products, and lean protein.  Do not eat a lot of foods that are high in fats, added sugars, or salt (sodium).  Make choices that simplify your life.  Do not use any products that contain nicotine or tobacco. These products include cigarettes, chewing tobacco, and vaping devices, such as e-cigarettes. If you need help quitting, ask your provider.  Avoid caffeine, alcohol, and certain over-the-counter cold medicines. These may make you feel worse. Ask your pharmacist which medicines to avoid.  General instructions  Take over-the-counter and prescription medicines only as told by your provider.  Keep all follow-up visits. This is to make sure you are managing your anxiety well or if you need more support.  Where to find support  You can get help and support from:  Self-help groups.  Online and [employer].  A trusted spiritual leader.  Couples counseling.  Family education classes.  Family therapy.  Where to find more information  You may find that joining a support group helps you deal with your anxiety. The following sources can help you find counselors or support groups near you:  Mental Health [place]: mentalhealthamerica.net  Anxiety and Depression Association of [place] (ADAA): adaa.org  [employer] Mental Illness (NAMI):  nami.org  Contact a health care provider if:  You have a hard time staying focused or finishing tasks.  You spend many hours a day feeling worried about everyday life.  You are very tired because you cannot stop worrying.  You start to have headaches or often feel tense.  You have chronic nausea or diarrhea.  Get help right away if:  Your heart feels like it is racing.  You have shortness of breath.  You have thoughts of hurting yourself or others.  Get help right away if you feel like you may hurt yourself or others, or have thoughts about taking your own life. Go to your nearest emergency room or:  Call 911.  Call the National Suicide Prevention Lifeline at (562)309-0957 or 988. This is open 24 hours a day.  Text the Crisis Text Line at 786-142-3811.  This information is not intended to replace advice given to you by your health care provider. Make sure you discuss any questions you have with your health care provider.  Document Revised: 05/16/2022 Document Reviewed: 11/28/2020  Elsevier Patient Education  2024 [employer].

## 2024-09-10 ENCOUNTER — Ambulatory Visit: Admitting: Nurse Practitioner

## 2024-09-10 ENCOUNTER — Encounter: Payer: Self-pay | Admitting: Nurse Practitioner

## 2024-09-10 VITALS — BP 128/80 | HR 66 | Temp 98.5°F | Ht 61.0 in | Wt 158.4 lb

## 2024-09-10 DIAGNOSIS — F1320 Sedative, hypnotic or anxiolytic dependence, uncomplicated: Secondary | ICD-10-CM

## 2024-09-10 DIAGNOSIS — Z79899 Other long term (current) drug therapy: Secondary | ICD-10-CM

## 2024-09-10 DIAGNOSIS — Z0283 Encounter for blood-alcohol and blood-drug test: Secondary | ICD-10-CM | POA: Diagnosis not present

## 2024-09-10 DIAGNOSIS — J432 Centrilobular emphysema: Secondary | ICD-10-CM | POA: Diagnosis not present

## 2024-09-10 DIAGNOSIS — K2270 Barrett's esophagus without dysplasia: Secondary | ICD-10-CM

## 2024-09-10 DIAGNOSIS — I728 Aneurysm of other specified arteries: Secondary | ICD-10-CM | POA: Diagnosis not present

## 2024-09-10 DIAGNOSIS — F339 Major depressive disorder, recurrent, unspecified: Secondary | ICD-10-CM | POA: Diagnosis not present

## 2024-09-10 DIAGNOSIS — D518 Other vitamin B12 deficiency anemias: Secondary | ICD-10-CM | POA: Diagnosis not present

## 2024-09-10 DIAGNOSIS — E782 Mixed hyperlipidemia: Secondary | ICD-10-CM

## 2024-09-10 DIAGNOSIS — E559 Vitamin D deficiency, unspecified: Secondary | ICD-10-CM

## 2024-09-10 DIAGNOSIS — F17210 Nicotine dependence, cigarettes, uncomplicated: Secondary | ICD-10-CM | POA: Diagnosis not present

## 2024-09-10 DIAGNOSIS — M797 Fibromyalgia: Secondary | ICD-10-CM | POA: Diagnosis not present

## 2024-09-10 DIAGNOSIS — F419 Anxiety disorder, unspecified: Secondary | ICD-10-CM

## 2024-09-10 MED ORDER — CLONAZEPAM 1 MG PO TABS: 1.0000 mg | ORAL_TABLET | Freq: Two times a day (BID) | ORAL | 2 refills | Status: AC

## 2024-09-10 NOTE — Assessment & Plan Note (Deleted)
Chronic, ongoing.  Continue current medication regimen and adjust as needed.  Continue collaboration with GI, ?related to Alpha Gal and have discussed this diet with her.

## 2024-09-10 NOTE — Assessment & Plan Note (Signed)
 Chronic, ongoing.  Continue current medication regimen and adjust as needed.  Denies SI/HI.  Consider change from Prozac  to Cymbalta in future, which would offer benefit with anxiety and depression + fibromyalgia/alpha gal -- she refuses this change today.  Very anxious about changes.  Referral to therapy placed in past, she never attended.  Continues to be concerned about health, that brings her the most anxiety.

## 2024-09-10 NOTE — Assessment & Plan Note (Signed)
 Chronic, ongoing.  No current inhaler regimen.  Denies any symptoms.  Continue yearly lung screening and plan on spirometry next visit to assess function.  She has not smoked a cigarette in 15 days, recommend she continue this.

## 2024-09-10 NOTE — Assessment & Plan Note (Addendum)
 Chronic, ongoing.  Recommended she try stopping Prozac  and change to Duloxetine which would benefit mood and pain, she refuses this - fearful of change.  Continue Tizanidine  as needed.  Tried Gabapentin  in past and did not like this. Could consider Lyrica.

## 2024-09-10 NOTE — Assessment & Plan Note (Signed)
Chronic, ongoing.  Continue daily supplement and adjust as needed.  Check level today.

## 2024-09-10 NOTE — Assessment & Plan Note (Signed)
 Chronic, ongoing.  Continue Klonopin  at this time, patient educated on risks of medication and is not interested in reducing -- refills sent in.  Discussed we would not increase this, unless she saw psychiatry.  Continue Prozac  40 MG which benefits anxiety and fibromyalgia, discussed with her changing to Zoloft or Duloxetine (which would benefit chronic pain issues) if increase anxiety/depression noted -- refuses.  At this time she denies SI/HI and wishes to maintain current regimen.  UDS yearly (next 09/10/25) and contract next visit.  Return in 3 months.

## 2024-09-10 NOTE — Assessment & Plan Note (Signed)
 Noted on lung screening 10/16/23. Following with vascular, will continue this collaboration.  Recent notes reviewed.

## 2024-09-10 NOTE — Assessment & Plan Note (Signed)
 Refer to anxiety plan for updates and discussion. UDS obtained due next 09/10/25.  Contract next visit.

## 2024-09-10 NOTE — Assessment & Plan Note (Signed)
Chronic, ongoing.  Continue current medication regimen and collaboration with cardiology.  Lipid panel today. 

## 2024-09-10 NOTE — Assessment & Plan Note (Signed)
Chronic, ongoing, continue daily supplement and adjust as needed.  Check level today.

## 2024-09-10 NOTE — Progress Notes (Signed)
 "  BP 128/80 (BP Location: Left Arm, Patient Position: Sitting, Cuff Size: Normal)   Pulse 66   Temp 98.5 F (36.9 C) (Oral)   Ht 5' 1 (1.549 m)   Wt 158 lb 6.4 oz (71.8 kg)   SpO2 94%   BMI 29.93 kg/m    Subjective:    Patient ID: Lindsey Bautista, female    DOB: 10/28/1960, 64 y.o.   MRN: [id number]  HPI: Lindsey Bautista is a 64 y.o. female  Chief Complaint  Patient presents with   Hypertension   Hyperlipidemia   Anxiety   HYPERLIPIDEMIA Takes Atorvastatin  and Zetia . History of CVA, >11 years ago.  Aortic atherosclerosis noted on past imaging. Cardiology visit last on 11/30/23, sees annually. Hyperlipidemia status: good compliance Satisfied with current treatment?  yes Side effects:  no Medication compliance: good compliance Supplements: none Aspirin :  yes The ASCVD Risk score (Arnett DK, et al., 2019) failed to calculate for the following reasons:   Risk score cannot be calculated because patient has a medical history suggesting prior/existing ASCVD   * - Cholesterol units were assumed Chest pain:  no Coronary artery disease:  no Family history CAD:  yes Family history early CAD:  no   COPD Centrilobular & paraseptal emphysema, no current inhalers. CT lung screening 10/16/23 this noted two peripherally calcified splenic artery aneurysms and ongoing right adrenal adenoma. Vascular visit on 02/14/24, to see yearly.  For 15 days she has not smoked. Was smoking  1/2 to 1 PPD, has smoked for >40 years.  COPD status: stable Satisfied with current treatment?: yes Oxygen use: no Dyspnea frequency: no Cough frequency: no Rescue inhaler frequency: none Limitation of activity: no Productive cough: none Last Spirometry: unknown Pneumovax: refuses Influenza: Up to Date   FIBROMYALGIA AND B12 DEFICIENCY Saw neurosurgery last 02/12/24 and rheumatology last 04/04/22. Uses Tizanidine  as needed, has not taken in a long time. Has constant pain, some days okay and other days worse. Takes  daily Vitamin D  and B12 due to past low levels. Duration: months Pain: yes Symmetric: yes - 4/10 today Quality: dull, aching, and throbbing Frequency: constant Context:  worse Decreased function/range of motion: yes Erythema: none Swelling: yes  Heat or warmth: none Morning stiffness: yes Aggravating factors: lifting heavy Alleviating factors: nothing Relief with NSAIDs?: No NSAIDs Taken Treatments attempted:  rest and heat  Involved Joints:     Hands: yes bilateral    Wrists: yes bilateral     Elbows: yes bilateral    Shoulders: yes bilateral    Back: yes     Hips: yes bilateral    Knees: yes bilateral    Ankles: yes bilateral    Feet: yes bilateral   DEPRESSION/ANXIETY  Takes Prozac  40 MG daily and Klonopin  1 MG BID.  Pt is aware of risks of benzo medication use to include increased sedation, respiratory suppression, falls, dependence and cardiovascular events.  Pt would like to continue treatment as benefit determined to outweigh risk -- has been on this for many years. Last Klonopin  fill 08/28/24. No other controlled substances noted. Refuses psychiatry, has seen in past and does want to return.  Husband dealing with chronic health issues at present. Mood status: stable Satisfied with current treatment?: yes Symptom severity: mild  Duration of current treatment : chronic Side effects: no Medication compliance: good compliance Psychotherapy/counseling: none Previous psychiatric medications: Prozac  and Klonopin  Depressed mood: yes occasional Anxious mood: yes occasional Anhedonia: no Significant weight loss or gain: no Insomnia: occasionally  Fatigue: occasionally Feelings of worthlessness or guilt: none Impaired concentration/indecisiveness: no Suicidal ideations: no Hopelessness: no Crying spells: no    09/10/2024    1:20 PM 06/04/2024    1:29 PM 02/27/2024    1:14 PM 11/01/2023    9:38 AM 08/31/2023    1:12 PM  Depression screen PHQ 2/9  Decreased Interest 1 1 1  1 1   Down, Depressed, Hopeless 2 2 2 1 2   PHQ - 2 Score 3 3 3 2 3   Altered sleeping 1 1 0 1 1  Tired, decreased energy 2 3 3 1 1   Change in appetite 1 1 1 1 2   Feeling bad or failure about yourself  1 2 2 1 2   Trouble concentrating 2 2 1 1 1   Moving slowly or fidgety/restless 1 1 1  0 2  Suicidal thoughts 0 0 0 0 0  PHQ-9 Score 11 13  11  7  12    Difficult doing work/chores Somewhat difficult Somewhat difficult Somewhat difficult Somewhat difficult Very difficult     Data saved with a previous flowsheet row definition      09/10/2024    1:20 PM 06/04/2024    1:30 PM 02/27/2024    1:14 PM 08/31/2023    1:12 PM  GAD 7 : Generalized Anxiety Score  Nervous, Anxious, on Edge 1 2  2  2    Control/stop worrying 2 3  2  2    Worry too much - different things 2 3  3  2    Trouble relaxing 1 1  2  2    Restless 1 1  1  1    Easily annoyed or irritable 2 2  2  2    Afraid - awful might happen 2 2  2  2    Total GAD 7 Score 11 14 14 13   Anxiety Difficulty Somewhat difficult Somewhat difficult Somewhat difficult Very difficult     Data saved with a previous flowsheet row definition   Relevant past medical, surgical, family and social history reviewed and updated as indicated. Interim medical history since our last visit reviewed. Allergies and medications reviewed and updated.  Review of Systems  Constitutional:  Negative for activity change, appetite change, diaphoresis, fatigue and fever.  Respiratory:  Negative for cough, chest tightness, shortness of breath and wheezing.   Cardiovascular:  Negative for chest pain, palpitations and leg swelling.  Gastrointestinal: Negative.   Endocrine: Negative for cold intolerance and heat intolerance.  Musculoskeletal:  Positive for arthralgias.  Neurological: Negative.   Psychiatric/Behavioral:  Negative for decreased concentration, self-injury, sleep disturbance and suicidal ideas. The patient is nervous/anxious.    Per HPI unless specifically indicated  above     Objective:    BP 128/80 (BP Location: Left Arm, Patient Position: Sitting, Cuff Size: Normal)   Pulse 66   Temp 98.5 F (36.9 C) (Oral)   Ht 5' 1 (1.549 m)   Wt 158 lb 6.4 oz (71.8 kg)   SpO2 94%   BMI 29.93 kg/m   Wt Readings from Last 3 Encounters:  09/10/24 158 lb 6.4 oz (71.8 kg)  06/04/24 151 lb 3.2 oz (68.6 kg)  02/27/24 148 lb (67.1 kg)    Physical Exam Vitals and nursing note reviewed.  Constitutional:      General: She is awake. She is not in acute distress.    Appearance: She is well-developed, well-groomed and overweight. She is not ill-appearing or toxic-appearing.  HENT:     Head: Normocephalic.     Right  Ear: Hearing and external ear normal.     Left Ear: Hearing and external ear normal.  Eyes:     General: Lids are normal.        Right eye: No discharge.        Left eye: No discharge.     Extraocular Movements: Extraocular movements intact.     Conjunctiva/sclera: Conjunctivae normal.     Pupils: Pupils are equal, round, and reactive to light.     Visual Fields: Right eye visual fields normal and left eye visual fields normal.  Neck:     Thyroid : No thyromegaly.     Vascular: No carotid bruit.  Cardiovascular:     Rate and Rhythm: Normal rate and regular rhythm.     Heart sounds: Normal heart sounds. No murmur heard.    No gallop.  Pulmonary:     Effort: Pulmonary effort is normal. No accessory muscle usage or respiratory distress.     Breath sounds: Normal breath sounds. No decreased breath sounds or wheezing.  Chest:  Breasts:    Right: Normal.     Left: Normal.  Abdominal:     General: Bowel sounds are normal.     Palpations: Abdomen is soft.     Tenderness: There is no right CVA tenderness, left CVA tenderness, guarding or rebound.     Hernia: No hernia is present.  Musculoskeletal:     Cervical back: Normal range of motion and neck supple.     Right lower leg: No edema.     Left lower leg: No edema.  Lymphadenopathy:      Cervical: No cervical adenopathy.     Upper Body:     Right upper body: No supraclavicular, axillary or pectoral adenopathy.     Left upper body: No supraclavicular, axillary or pectoral adenopathy.  Skin:    General: Skin is warm and dry.  Neurological:     Mental Status: She is alert and oriented to person, place, and time.     Cranial Nerves: Cranial nerves 2-12 are intact.     Sensory: Sensation is intact.     Motor: Motor function is intact.     Coordination: Coordination is intact.     Gait: Gait is intact.     Deep Tendon Reflexes: Reflexes are normal and symmetric.     Reflex Scores:      Brachioradialis reflexes are 2+ on the right side and 2+ on the left side.      Patellar reflexes are 2+ on the right side and 2+ on the left side. Psychiatric:        Attention and Perception: Attention normal.        Mood and Affect: Mood normal.        Speech: Speech normal.        Behavior: Behavior normal. Behavior is cooperative.        Thought Content: Thought content normal.    Results for orders placed or performed in visit on 06/04/24  Urinalysis, Routine w reflex microscopic   Collection Time: 06/04/24  1:35 PM  Result Value Ref Range   Specific Gravity, UA 1.015 1.005 - 1.030   pH, UA 8.5 (H) 5.0 - 7.5   Color, UA Yellow Yellow   Appearance Ur Clear Clear   Leukocytes,UA Negative Negative   Protein,UA Negative Negative/Trace   Glucose, UA Negative Negative   Ketones, UA Negative Negative   RBC, UA Negative Negative   Bilirubin, UA Negative Negative   Urobilinogen,  Ur 0.2 0.2 - 1.0 mg/dL   Nitrite, UA Negative Negative   Microscopic Examination Comment       Assessment & Plan:   Problem List Items Addressed This Visit       Cardiovascular and Mediastinum   Aneurysm of splenic artery   Noted on lung screening 10/16/23. Following with vascular, will continue this collaboration.  Recent notes reviewed.        Respiratory   Centrilobular emphysema (HCC) - Primary    Chronic, ongoing.  No current inhaler regimen.  Denies any symptoms.  Continue yearly lung screening and plan on spirometry next visit to assess function.  She has not smoked a cigarette in 15 days, recommend she continue this.      Relevant Orders   CBC with Differential/Platelet     Other   Vitamin D  deficiency   Chronic, ongoing.  Continue daily supplement and adjust as needed.  Check level today.      Relevant Orders   VITAMIN D  25 Hydroxy (Vit-D Deficiency, Fractures)   Nicotine  dependence, cigarettes, uncomplicated   Has not smoked in 15 days, recommend to continue cessation of smoking.      Hyperlipidemia   Chronic, ongoing.  Continue current medication regimen and collaboration with cardiology.  Lipid panel today.      Relevant Orders   Comprehensive metabolic panel with GFR   Lipid Panel w/o Chol/HDL Ratio   High risk medication use   Long term benzo use.  Obtain UDS today (next 09/10/25) and obtain contract next visit.      Relevant Orders   [id number] 11+Oxyco+Alc+Crt-Bund   Fibromyalgia   Chronic, ongoing.  Recommended she try stopping Prozac  and change to Duloxetine which would benefit mood and pain, she refuses this - fearful of change.  Continue Tizanidine  as needed.  Tried Gabapentin  in past and did not like this. Could consider Lyrica.      Relevant Medications   clonazePAM  (KLONOPIN ) 1 MG tablet (Start on 09/27/2024)   Dietary vitamin B12 deficiency anemia   Chronic, ongoing, continue daily supplement and adjust as needed.  Check level today.      Relevant Orders   CBC with Differential/Platelet   Vitamin B12   Depression, recurrent   Chronic, ongoing.  Continue current medication regimen and adjust as needed.  Denies SI/HI.  Consider change from Prozac  to Cymbalta in future, which would offer benefit with anxiety and depression + fibromyalgia/alpha gal -- she refuses this change today.  Very anxious about changes.  Referral to therapy placed in past, she  never attended.  Continues to be concerned about health, that brings her the most anxiety.      Relevant Orders   [id number] 11+Oxyco+Alc+Crt-Bund   TSH   Benzodiazepine dependence, continuous (HCC)   Refer to anxiety plan for updates and discussion. UDS obtained due next 09/10/25.  Contract next visit.      Relevant Orders   [id number] 11+Oxyco+Alc+Crt-Bund   Anxiety   Chronic, ongoing.  Continue Klonopin  at this time, patient educated on risks of medication and is not interested in reducing -- refills sent in.  Discussed we would not increase this, unless she saw psychiatry.  Continue Prozac  40 MG which benefits anxiety and fibromyalgia, discussed with her changing to Zoloft or Duloxetine (which would benefit chronic pain issues) if increase anxiety/depression noted -- refuses.  At this time she denies SI/HI and wishes to maintain current regimen.  UDS yearly (next 09/10/25) and contract next visit.  Return in 3  months.       Relevant Medications   clonazePAM  (KLONOPIN ) 1 MG tablet (Start on 09/27/2024)   Other Relevant Orders   [id number] 11+Oxyco+Alc+Crt-Bund   Other Visit Diagnoses       Encounter for drug screening       UDS today due to Klonopin  use and office rules with controlled substance.   Relevant Orders   [id number] 11+Oxyco+Alc+Crt-Bund        Follow up plan: Return in about 3 months (around 12/09/2024) for ANXIETY. "

## 2024-09-10 NOTE — Assessment & Plan Note (Signed)
 Has not smoked in 15 days, recommend to continue cessation of smoking.

## 2024-09-10 NOTE — Assessment & Plan Note (Signed)
 Long term benzo use.  Obtain UDS today (next 09/10/25) and obtain contract next visit.

## 2024-09-11 ENCOUNTER — Ambulatory Visit: Payer: Self-pay | Admitting: Nurse Practitioner

## 2024-09-11 LAB — LIPID PANEL W/O CHOL/HDL RATIO
Cholesterol, Total: 231 mg/dL — ABNORMAL HIGH (ref 100–199)
HDL: 67 mg/dL
LDL Chol Calc (NIH): 143 mg/dL — ABNORMAL HIGH (ref 0–99)
Triglycerides: 118 mg/dL (ref 0–149)
VLDL Cholesterol Cal: 21 mg/dL (ref 5–40)

## 2024-09-11 LAB — CBC WITH DIFFERENTIAL/PLATELET
Basophils Absolute: 0.1 x10E3/uL (ref 0.0–0.2)
Basos: 1 %
EOS (ABSOLUTE): 0.1 x10E3/uL (ref 0.0–0.4)
Eos: 1 %
Hematocrit: 40.5 % (ref 34.0–46.6)
Hemoglobin: 13.4 g/dL (ref 11.1–15.9)
Immature Grans (Abs): 0 x10E3/uL (ref 0.0–0.1)
Immature Granulocytes: 0 %
Lymphocytes Absolute: 2.3 x10E3/uL (ref 0.7–3.1)
Lymphs: 41 %
MCH: 30.5 pg (ref 26.6–33.0)
MCHC: 33.1 g/dL (ref 31.5–35.7)
MCV: 92 fL (ref 79–97)
Monocytes Absolute: 0.5 x10E3/uL (ref 0.1–0.9)
Monocytes: 8 %
Neutrophils Absolute: 2.8 x10E3/uL (ref 1.4–7.0)
Neutrophils: 49 %
Platelets: 308 x10E3/uL (ref 150–450)
RBC: 4.4 x10E6/uL (ref 3.77–5.28)
RDW: 12.7 % (ref 11.7–15.4)
WBC: 5.8 x10E3/uL (ref 3.4–10.8)

## 2024-09-11 LAB — COMPREHENSIVE METABOLIC PANEL WITH GFR
ALT: 12 IU/L (ref 0–32)
AST: 18 IU/L (ref 0–40)
Albumin: 4.2 g/dL (ref 3.9–4.9)
Alkaline Phosphatase: 88 IU/L (ref 49–135)
BUN/Creatinine Ratio: 12 (ref 12–28)
BUN: 10 mg/dL (ref 8–27)
Bilirubin Total: 0.5 mg/dL (ref 0.0–1.2)
CO2: 22 mmol/L (ref 20–29)
Calcium: 8.9 mg/dL (ref 8.7–10.3)
Chloride: 104 mmol/L (ref 96–106)
Creatinine, Ser: 0.81 mg/dL (ref 0.57–1.00)
Globulin, Total: 2.1 g/dL (ref 1.5–4.5)
Glucose: 81 mg/dL (ref 70–99)
Potassium: 4.5 mmol/L (ref 3.5–5.2)
Sodium: 140 mmol/L (ref 134–144)
Total Protein: 6.3 g/dL (ref 6.0–8.5)
eGFR: 82 mL/min/1.73

## 2024-09-11 LAB — VITAMIN D 25 HYDROXY (VIT D DEFICIENCY, FRACTURES): Vit D, 25-Hydroxy: 12.7 ng/mL — ABNORMAL LOW (ref 30.0–100.0)

## 2024-09-11 LAB — VITAMIN B12: Vitamin B-12: 294 pg/mL (ref 232–1245)

## 2024-09-11 LAB — TSH: TSH: 1.17 u[IU]/mL (ref 0.450–4.500)

## 2024-09-11 NOTE — Progress Notes (Signed)
 Good day crew, please let Lindsey Bautista know her labs have returned: - Lipid panel is showing elevations from all previous checks. These elevations are above goal for stroke prevention. Are you taking Atorvastatin  daily? Please ensure you do as levels were better when taken as ordered. - Vitamin D  level is low. Please ensure you are taking Vitamin D3 2000 units daily for your bone health. B12 level also low, ensure to take Vitamin B12 1000 MCG daily. Both you can obtain over the counter. - Remainder of labs are stable. Any questions? Keep being amazing!!  Thank you for allowing me to participate in your care.  I appreciate you. Kindest regards, Lindsey Bautista

## 2024-09-12 LAB — DRUG SCREEN 764883 11+OXYCO+ALC+CRT-BUND
Amphetamines, Urine: NEGATIVE ng/mL
BENZODIAZ UR QL: NEGATIVE ng/mL
Barbiturate screen, urine: NEGATIVE ng/mL
Cannabinoid Quant, Ur: NEGATIVE ng/mL
Cocaine (Metab.): NEGATIVE ng/mL
Creatinine, Urine: 70.9 mg/dL (ref 20.0–300.0)
Ethanol, Urine: NEGATIVE %
Meperidine: NEGATIVE ng/mL
Methadone Screen, Urine: NEGATIVE ng/mL
Nitrite Urine, Quantitative: NEGATIVE ug/mL
OPIATE SCREEN URINE: NEGATIVE ng/mL
Oxycodone/Oxymorphone, Urine: NEGATIVE ng/mL
PCP Quant, Ur: NEGATIVE ng/mL
Propoxyphene: NEGATIVE ng/mL
TRAMADOL: NEGATIVE ng/mL
pH, Urine: 7.5 (ref 4.5–8.9)

## 2024-10-16 ENCOUNTER — Ambulatory Visit

## 2024-10-16 ENCOUNTER — Encounter

## 2024-11-11 ENCOUNTER — Ambulatory Visit: Admitting: Cardiovascular Disease

## 2024-11-13 ENCOUNTER — Ambulatory Visit

## 2024-11-14 ENCOUNTER — Ambulatory Visit: Admitting: Cardiovascular Disease

## 2025-02-12 ENCOUNTER — Encounter (INDEPENDENT_AMBULATORY_CARE_PROVIDER_SITE_OTHER)

## 2025-02-12 ENCOUNTER — Ambulatory Visit (INDEPENDENT_AMBULATORY_CARE_PROVIDER_SITE_OTHER): Admitting: Vascular Surgery
